# Patient Record
Sex: Male | Born: 1940
Health system: Southern US, Community
[De-identification: ages and names within clinical notes are randomized; demographics above are authoritative.]

## PROBLEM LIST (undated history)

## (undated) DIAGNOSIS — L570 Actinic keratosis: Secondary | ICD-10-CM

## (undated) DIAGNOSIS — H9201 Otalgia, right ear: Secondary | ICD-10-CM

## (undated) DIAGNOSIS — N400 Enlarged prostate without lower urinary tract symptoms: Secondary | ICD-10-CM

## (undated) DIAGNOSIS — E119 Type 2 diabetes mellitus without complications: Secondary | ICD-10-CM

## (undated) DIAGNOSIS — E785 Hyperlipidemia, unspecified: Secondary | ICD-10-CM

## (undated) DIAGNOSIS — M546 Pain in thoracic spine: Secondary | ICD-10-CM

## (undated) DIAGNOSIS — M6281 Muscle weakness (generalized): Secondary | ICD-10-CM

## (undated) DIAGNOSIS — I341 Nonrheumatic mitral (valve) prolapse: Secondary | ICD-10-CM

## (undated) DIAGNOSIS — Z9989 Dependence on other enabling machines and devices: Secondary | ICD-10-CM

## (undated) DIAGNOSIS — F329 Major depressive disorder, single episode, unspecified: Secondary | ICD-10-CM

## (undated) DIAGNOSIS — M19049 Primary osteoarthritis, unspecified hand: Secondary | ICD-10-CM

## (undated) DIAGNOSIS — E291 Testicular hypofunction: Secondary | ICD-10-CM

## (undated) DIAGNOSIS — N529 Male erectile dysfunction, unspecified: Secondary | ICD-10-CM

## (undated) DIAGNOSIS — F32A Depression, unspecified: Secondary | ICD-10-CM

## (undated) DIAGNOSIS — F4310 Post-traumatic stress disorder, unspecified: Secondary | ICD-10-CM

## (undated) DIAGNOSIS — R1310 Dysphagia, unspecified: Secondary | ICD-10-CM

## (undated) DIAGNOSIS — M169 Osteoarthritis of hip, unspecified: Secondary | ICD-10-CM

## (undated) DIAGNOSIS — M545 Low back pain, unspecified: Secondary | ICD-10-CM

## (undated) DIAGNOSIS — M25519 Pain in unspecified shoulder: Secondary | ICD-10-CM

## (undated) DIAGNOSIS — G479 Sleep disorder, unspecified: Secondary | ICD-10-CM

## (undated) DIAGNOSIS — R569 Unspecified convulsions: Secondary | ICD-10-CM

## (undated) DIAGNOSIS — M1990 Unspecified osteoarthritis, unspecified site: Secondary | ICD-10-CM

## (undated) DIAGNOSIS — H5310 Unspecified subjective visual disturbances: Secondary | ICD-10-CM

## (undated) DIAGNOSIS — I639 Cerebral infarction, unspecified: Secondary | ICD-10-CM

## (undated) DIAGNOSIS — IMO0002 Reserved for concepts with insufficient information to code with codable children: Secondary | ICD-10-CM

## (undated) DIAGNOSIS — R002 Palpitations: Secondary | ICD-10-CM

## (undated) DIAGNOSIS — M542 Cervicalgia: Secondary | ICD-10-CM

## (undated) DIAGNOSIS — I499 Cardiac arrhythmia, unspecified: Secondary | ICD-10-CM

## (undated) DIAGNOSIS — R609 Edema, unspecified: Secondary | ICD-10-CM

## (undated) DIAGNOSIS — M2550 Pain in unspecified joint: Secondary | ICD-10-CM

## (undated) DIAGNOSIS — R001 Bradycardia, unspecified: Secondary | ICD-10-CM

## (undated) DIAGNOSIS — I10 Essential (primary) hypertension: Secondary | ICD-10-CM

## (undated) DIAGNOSIS — M25562 Pain in left knee: Secondary | ICD-10-CM

## (undated) DIAGNOSIS — G4733 Obstructive sleep apnea (adult) (pediatric): Secondary | ICD-10-CM

## (undated) DIAGNOSIS — I4891 Unspecified atrial fibrillation: Secondary | ICD-10-CM

## (undated) HISTORY — DX: Unspecified convulsions: R56.9

## (undated) HISTORY — PX: KNEE SURGERY: SHX244

## (undated) HISTORY — DX: Male erectile dysfunction, unspecified: N52.9

## (undated) HISTORY — DX: Obstructive sleep apnea (adult) (pediatric): G47.33

## (undated) HISTORY — DX: Hyperlipidemia, unspecified: E78.5

## (undated) HISTORY — PX: CATARACT EXTRACTION: SUR2

## (undated) HISTORY — PX: COLONOSCOPY: SHX174

## (undated) HISTORY — DX: Depression, unspecified: F32.A

## (undated) HISTORY — DX: Osteoarthritis of hip, unspecified: M16.9

## (undated) HISTORY — DX: Low back pain, unspecified: M54.50

## (undated) HISTORY — DX: Essential (primary) hypertension: I10

## (undated) HISTORY — DX: Primary osteoarthritis, unspecified hand: M19.049

## (undated) HISTORY — DX: Post-traumatic stress disorder, unspecified: F43.10

## (undated) HISTORY — DX: Unspecified atrial fibrillation: I48.91

## (undated) HISTORY — PX: APPENDECTOMY: SHX54

## (undated) HISTORY — DX: Pain in unspecified shoulder: M25.519

## (undated) HISTORY — DX: Pain in unspecified joint: M25.50

## (undated) HISTORY — PX: CHOLECYSTECTOMY: SHX55

## (undated) HISTORY — DX: Testicular hypofunction: E29.1

## (undated) HISTORY — DX: Muscle weakness (generalized): M62.81

## (undated) HISTORY — DX: Low back pain: M54.5

## (undated) HISTORY — DX: Pain in left knee: M25.562

## (undated) HISTORY — PX: CARDIAC CATHETERIZATION: SHX172

## (undated) HISTORY — DX: Otalgia, right ear: H92.01

## (undated) HISTORY — DX: Actinic keratosis: L57.0

## (undated) HISTORY — DX: Nonrheumatic mitral (valve) prolapse: I34.1

## (undated) HISTORY — DX: Cervicalgia: M54.2

## (undated) HISTORY — DX: Type 2 diabetes mellitus without complications: E11.9

## (undated) HISTORY — DX: Dependence on other enabling machines and devices: Z99.89

## (undated) HISTORY — DX: Pain in thoracic spine: M54.6

## (undated) HISTORY — DX: Edema, unspecified: R60.9

## (undated) HISTORY — DX: Major depressive disorder, single episode, unspecified: F32.9

## (undated) HISTORY — DX: Unspecified subjective visual disturbances: H53.10

## (undated) HISTORY — DX: Benign prostatic hyperplasia without lower urinary tract symptoms: N40.0

## (undated) HISTORY — DX: Palpitations: R00.2

## (undated) HISTORY — DX: Sleep disorder, unspecified: G47.9

## (undated) HISTORY — DX: Unspecified osteoarthritis, unspecified site: M19.90

## (undated) HISTORY — PX: JOINT REPLACEMENT: SHX530

## (undated) HISTORY — DX: Reserved for concepts with insufficient information to code with codable children: IMO0002

---

## 1985-03-30 HISTORY — PX: BACK SURGERY: SHX140

## 1999-09-05 ENCOUNTER — Encounter: Admission: RE | Admit: 1999-09-05 | Discharge: 1999-09-05 | Payer: Self-pay | Admitting: Orthopedic Surgery

## 1999-09-05 ENCOUNTER — Encounter: Payer: Self-pay | Admitting: Orthopedic Surgery

## 2000-07-18 ENCOUNTER — Encounter: Payer: Self-pay | Admitting: Pulmonary Disease

## 2000-07-18 ENCOUNTER — Ambulatory Visit (HOSPITAL_BASED_OUTPATIENT_CLINIC_OR_DEPARTMENT_OTHER): Admission: RE | Admit: 2000-07-18 | Discharge: 2000-07-18 | Payer: Self-pay | Admitting: Critical Care Medicine

## 2000-09-07 ENCOUNTER — Ambulatory Visit (HOSPITAL_BASED_OUTPATIENT_CLINIC_OR_DEPARTMENT_OTHER): Admission: RE | Admit: 2000-09-07 | Discharge: 2000-09-07 | Payer: Self-pay | Admitting: Critical Care Medicine

## 2000-09-07 ENCOUNTER — Encounter: Payer: Self-pay | Admitting: Pulmonary Disease

## 2002-03-30 HISTORY — PX: CERVICAL LAMINECTOMY: SHX94

## 2002-06-11 ENCOUNTER — Ambulatory Visit (HOSPITAL_COMMUNITY): Admission: RE | Admit: 2002-06-11 | Discharge: 2002-06-11 | Payer: Self-pay | Admitting: Rheumatology

## 2002-06-11 ENCOUNTER — Encounter: Payer: Self-pay | Admitting: Neurosurgery

## 2002-06-26 ENCOUNTER — Encounter: Payer: Self-pay | Admitting: Neurosurgery

## 2002-06-28 ENCOUNTER — Inpatient Hospital Stay (HOSPITAL_COMMUNITY): Admission: RE | Admit: 2002-06-28 | Discharge: 2002-07-01 | Payer: Self-pay | Admitting: Neurosurgery

## 2002-06-28 ENCOUNTER — Encounter: Payer: Self-pay | Admitting: Neurosurgery

## 2002-08-07 ENCOUNTER — Encounter: Admission: RE | Admit: 2002-08-07 | Discharge: 2002-11-05 | Payer: Self-pay | Admitting: Neurosurgery

## 2004-01-23 ENCOUNTER — Inpatient Hospital Stay (HOSPITAL_COMMUNITY): Admission: EM | Admit: 2004-01-23 | Discharge: 2004-01-24 | Payer: Self-pay | Admitting: Emergency Medicine

## 2004-01-24 ENCOUNTER — Encounter: Payer: Self-pay | Admitting: Cardiology

## 2004-02-06 ENCOUNTER — Ambulatory Visit: Payer: Self-pay | Admitting: Cardiovascular Disease

## 2004-02-06 ENCOUNTER — Ambulatory Visit: Payer: Self-pay | Admitting: Internal Medicine

## 2004-04-16 ENCOUNTER — Ambulatory Visit: Payer: Self-pay | Admitting: Internal Medicine

## 2004-06-16 ENCOUNTER — Ambulatory Visit: Payer: Self-pay | Admitting: Internal Medicine

## 2004-09-16 ENCOUNTER — Ambulatory Visit: Payer: Self-pay | Admitting: Internal Medicine

## 2004-11-12 ENCOUNTER — Ambulatory Visit: Payer: Self-pay | Admitting: Internal Medicine

## 2005-01-14 ENCOUNTER — Ambulatory Visit: Payer: Self-pay | Admitting: Internal Medicine

## 2005-02-17 ENCOUNTER — Ambulatory Visit: Payer: Self-pay | Admitting: Internal Medicine

## 2005-02-27 ENCOUNTER — Ambulatory Visit: Payer: Self-pay | Admitting: Cardiovascular Disease

## 2005-03-03 ENCOUNTER — Ambulatory Visit: Payer: Self-pay | Admitting: Internal Medicine

## 2005-05-05 ENCOUNTER — Ambulatory Visit: Payer: Self-pay | Admitting: Internal Medicine

## 2005-05-19 ENCOUNTER — Ambulatory Visit: Payer: Self-pay | Admitting: Internal Medicine

## 2005-07-23 ENCOUNTER — Ambulatory Visit: Payer: Self-pay | Admitting: Internal Medicine

## 2005-09-23 ENCOUNTER — Ambulatory Visit: Payer: Self-pay | Admitting: Internal Medicine

## 2005-12-24 ENCOUNTER — Ambulatory Visit: Payer: Self-pay | Admitting: Internal Medicine

## 2006-02-25 ENCOUNTER — Ambulatory Visit: Payer: Self-pay | Admitting: Internal Medicine

## 2006-05-10 ENCOUNTER — Ambulatory Visit: Payer: Self-pay | Admitting: Internal Medicine

## 2006-05-10 LAB — CONVERTED CEMR LAB
AST: 24 units/L (ref 0–37)
Bilirubin, Direct: 0.1 mg/dL (ref 0.0–0.3)
Cholesterol: 121 mg/dL (ref 0–200)
GFR calc Af Amer: 125 mL/min
GFR calc non Af Amer: 103 mL/min
Glucose, Bld: 90 mg/dL (ref 70–99)
HDL: 23 mg/dL — ABNORMAL LOW (ref >39.0)
LDL Cholesterol: 72 mg/dL (ref 0–99)
Sodium: 139 meq/L (ref 135–145)
Total CHOL/HDL Ratio: 5.3
Total Protein: 6.6 g/dL (ref 6.0–8.3)

## 2006-05-12 ENCOUNTER — Ambulatory Visit: Payer: Self-pay | Admitting: Internal Medicine

## 2006-08-12 ENCOUNTER — Ambulatory Visit: Payer: Self-pay | Admitting: Internal Medicine

## 2006-08-12 LAB — CONVERTED CEMR LAB: Vit D, 1,25-Dihydroxy: 24 (ref 20–57)

## 2006-10-13 ENCOUNTER — Ambulatory Visit: Payer: Self-pay | Admitting: Internal Medicine

## 2006-12-15 ENCOUNTER — Ambulatory Visit: Payer: Self-pay | Admitting: Internal Medicine

## 2007-01-11 ENCOUNTER — Encounter: Payer: Self-pay | Admitting: Internal Medicine

## 2007-01-11 DIAGNOSIS — I10 Essential (primary) hypertension: Secondary | ICD-10-CM | POA: Insufficient documentation

## 2007-01-11 DIAGNOSIS — M545 Low back pain, unspecified: Secondary | ICD-10-CM | POA: Insufficient documentation

## 2007-01-11 DIAGNOSIS — E119 Type 2 diabetes mellitus without complications: Secondary | ICD-10-CM

## 2007-02-09 ENCOUNTER — Ambulatory Visit: Payer: Self-pay | Admitting: Internal Medicine

## 2007-02-09 LAB — CONVERTED CEMR LAB
Albumin: 3.7 g/dL (ref 3.5–5.2)
Alkaline Phosphatase: 65 units/L (ref 39–117)
BUN: 9 mg/dL (ref 6–23)
Basophils Absolute: 0 10*3/uL (ref 0.0–0.1)
Bilirubin Urine: NEGATIVE
Calcium: 9.1 mg/dL (ref 8.4–10.5)
GFR calc Af Amer: 109 mL/min
GFR calc non Af Amer: 90 mL/min
HDL: 18.7 mg/dL — ABNORMAL LOW (ref >39.0)
Hemoglobin, Urine: NEGATIVE
Hemoglobin: 14.8 g/dL (ref 13.0–17.0)
Leukocytes, UA: NEGATIVE
Lymphocytes Relative: 32.5 % (ref 12.0–46.0)
MCHC: 35.7 g/dL (ref 30.0–36.0)
Monocytes Absolute: 0.5 10*3/uL (ref 0.2–0.7)
Monocytes Relative: 7.2 % (ref 3.0–11.0)
Neutro Abs: 4.3 10*3/uL (ref 1.4–7.7)
Platelets: 252 10*3/uL (ref 150–400)
Potassium: 4.5 meq/L (ref 3.5–5.1)
Total CHOL/HDL Ratio: 7.8
Total Protein, Urine: NEGATIVE mg/dL
Triglycerides: 134 mg/dL (ref 0–149)
Urine Glucose: NEGATIVE mg/dL
VLDL: 27 mg/dL (ref 0–40)
pH: 6 (ref 5.0–8.0)

## 2007-02-14 ENCOUNTER — Ambulatory Visit: Payer: Self-pay | Admitting: Internal Medicine

## 2007-02-14 DIAGNOSIS — E785 Hyperlipidemia, unspecified: Secondary | ICD-10-CM

## 2007-02-14 DIAGNOSIS — F329 Major depressive disorder, single episode, unspecified: Secondary | ICD-10-CM

## 2007-02-14 DIAGNOSIS — L219 Seborrheic dermatitis, unspecified: Secondary | ICD-10-CM | POA: Insufficient documentation

## 2007-02-14 DIAGNOSIS — I4891 Unspecified atrial fibrillation: Secondary | ICD-10-CM | POA: Insufficient documentation

## 2007-02-15 DIAGNOSIS — M1990 Unspecified osteoarthritis, unspecified site: Secondary | ICD-10-CM | POA: Insufficient documentation

## 2007-03-16 ENCOUNTER — Telehealth: Payer: Self-pay | Admitting: Internal Medicine

## 2007-04-19 ENCOUNTER — Telehealth: Payer: Self-pay | Admitting: Internal Medicine

## 2007-04-29 ENCOUNTER — Ambulatory Visit: Payer: Self-pay | Admitting: Internal Medicine

## 2007-04-29 DIAGNOSIS — M25559 Pain in unspecified hip: Secondary | ICD-10-CM | POA: Insufficient documentation

## 2007-05-23 ENCOUNTER — Telehealth: Payer: Self-pay | Admitting: Internal Medicine

## 2007-05-26 ENCOUNTER — Encounter: Payer: Self-pay | Admitting: Internal Medicine

## 2007-06-24 ENCOUNTER — Ambulatory Visit: Payer: Self-pay | Admitting: Internal Medicine

## 2007-06-29 LAB — CONVERTED CEMR LAB
AST: 21 units/L (ref 0–37)
Albumin: 3.7 g/dL (ref 3.5–5.2)
BUN: 8 mg/dL (ref 6–23)
Calcium: 8.6 mg/dL (ref 8.4–10.5)
Chloride: 106 meq/L (ref 96–112)
Cholesterol: 140 mg/dL (ref 0–200)
Creatinine, Ser: 0.8 mg/dL (ref 0.4–1.5)
GFR calc Af Amer: 124 mL/min
GFR calc non Af Amer: 103 mL/min
HDL: 21 mg/dL — ABNORMAL LOW (ref >39.0)
LDL Cholesterol: 83 mg/dL (ref 0–99)
Triglycerides: 180 mg/dL — ABNORMAL HIGH (ref 0–149)
VLDL: 36 mg/dL (ref 0–40)

## 2007-08-19 ENCOUNTER — Encounter: Payer: Self-pay | Admitting: Internal Medicine

## 2007-08-25 ENCOUNTER — Ambulatory Visit: Payer: Self-pay | Admitting: Internal Medicine

## 2007-08-25 DIAGNOSIS — H5310 Unspecified subjective visual disturbances: Secondary | ICD-10-CM | POA: Insufficient documentation

## 2007-08-25 DIAGNOSIS — G478 Other sleep disorders: Secondary | ICD-10-CM

## 2007-08-25 DIAGNOSIS — R002 Palpitations: Secondary | ICD-10-CM | POA: Insufficient documentation

## 2007-08-25 DIAGNOSIS — R569 Unspecified convulsions: Secondary | ICD-10-CM | POA: Insufficient documentation

## 2007-08-26 ENCOUNTER — Encounter: Payer: Self-pay | Admitting: Internal Medicine

## 2007-08-26 DIAGNOSIS — E538 Deficiency of other specified B group vitamins: Secondary | ICD-10-CM

## 2007-08-29 LAB — CONVERTED CEMR LAB
AST: 22 units/L (ref 0–37)
Albumin: 4 g/dL (ref 3.5–5.2)
Alkaline Phosphatase: 70 units/L (ref 39–117)
BUN: 12 mg/dL (ref 6–23)
Chloride: 106 meq/L (ref 96–112)
Eosinophils Relative: 0.5 % (ref 0.0–5.0)
Glucose, Bld: 122 mg/dL — ABNORMAL HIGH (ref 70–99)
HCT: 43.5 % (ref 39.0–52.0)
Monocytes Absolute: 0.6 10*3/uL (ref 0.1–1.0)
Monocytes Relative: 8.2 % (ref 3.0–12.0)
Platelets: 254 10*3/uL (ref 150–400)
Potassium: 3.9 meq/L (ref 3.5–5.1)
Total Protein: 7.3 g/dL (ref 6.0–8.3)
WBC: 7.9 10*3/uL (ref 4.5–10.5)

## 2007-09-02 ENCOUNTER — Encounter (INDEPENDENT_AMBULATORY_CARE_PROVIDER_SITE_OTHER): Payer: Self-pay | Admitting: *Deleted

## 2007-11-21 ENCOUNTER — Encounter: Payer: Self-pay | Admitting: Internal Medicine

## 2007-12-06 ENCOUNTER — Ambulatory Visit: Payer: Self-pay | Admitting: Internal Medicine

## 2008-01-02 ENCOUNTER — Telehealth: Payer: Self-pay | Admitting: Internal Medicine

## 2008-01-03 ENCOUNTER — Telehealth: Payer: Self-pay | Admitting: Internal Medicine

## 2008-02-03 ENCOUNTER — Telehealth: Payer: Self-pay | Admitting: Internal Medicine

## 2008-03-08 ENCOUNTER — Ambulatory Visit: Payer: Self-pay | Admitting: Internal Medicine

## 2008-03-08 ENCOUNTER — Telehealth: Payer: Self-pay | Admitting: Internal Medicine

## 2008-03-08 LAB — CONVERTED CEMR LAB
ALT: 27 units/L (ref 0–53)
CO2: 29 meq/L (ref 19–32)
Calcium: 9 mg/dL (ref 8.4–10.5)
Creatinine, Ser: 0.9 mg/dL (ref 0.4–1.5)
Glucose, Bld: 107 mg/dL — ABNORMAL HIGH (ref 70–99)
TSH: 0.89 microintl units/mL (ref 0.35–5.50)
Total Protein: 6.7 g/dL (ref 6.0–8.3)

## 2008-03-14 ENCOUNTER — Ambulatory Visit: Payer: Self-pay | Admitting: Internal Medicine

## 2008-04-10 ENCOUNTER — Telehealth: Payer: Self-pay | Admitting: Internal Medicine

## 2008-04-17 ENCOUNTER — Ambulatory Visit: Payer: Self-pay | Admitting: Internal Medicine

## 2008-04-17 LAB — CONVERTED CEMR LAB
Calcium: 9 mg/dL (ref 8.4–10.5)
GFR calc Af Amer: 124 mL/min
Glucose, Bld: 101 mg/dL — ABNORMAL HIGH (ref 70–99)
Sodium: 137 meq/L (ref 135–145)
Testosterone: 279.88 ng/dL — ABNORMAL LOW (ref 350.00–890)

## 2008-05-02 ENCOUNTER — Ambulatory Visit: Payer: Self-pay | Admitting: Internal Medicine

## 2008-05-02 DIAGNOSIS — E291 Testicular hypofunction: Secondary | ICD-10-CM | POA: Insufficient documentation

## 2008-05-02 LAB — CONVERTED CEMR LAB
Alkaline Phosphatase: 70 units/L (ref 39–117)
Bilirubin, Direct: 0.1 mg/dL (ref 0.0–0.3)
Eosinophils Absolute: 0.1 10*3/uL (ref 0.0–0.7)
Eosinophils Relative: 1 % (ref 0.0–5.0)
MCV: 89.1 fL (ref 78.0–100.0)
Neutrophils Relative %: 63.7 % (ref 43.0–77.0)
Platelets: 239 10*3/uL (ref 150–400)
RDW: 13.1 % (ref 11.5–14.6)
Total Bilirubin: 0.5 mg/dL (ref 0.3–1.2)
Total Protein: 7.4 g/dL (ref 6.0–8.3)
WBC: 8.8 10*3/uL (ref 4.5–10.5)

## 2008-06-07 ENCOUNTER — Telehealth: Payer: Self-pay | Admitting: Internal Medicine

## 2008-07-02 ENCOUNTER — Ambulatory Visit: Payer: Self-pay | Admitting: Internal Medicine

## 2008-07-04 LAB — CONVERTED CEMR LAB
ALT: 21 units/L (ref 0–53)
AST: 22 units/L (ref 0–37)
Alkaline Phosphatase: 60 units/L (ref 39–117)
Bilirubin, Direct: 0.1 mg/dL (ref 0.0–0.3)
Chloride: 104 meq/L (ref 96–112)
GFR calc non Af Amer: 79.08 mL/min (ref >60)
Hgb A1c MFr Bld: 5.2 % (ref 4.6–6.5)
Potassium: 3.9 meq/L (ref 3.5–5.1)
Sodium: 140 meq/L (ref 135–145)
Total Bilirubin: 0.8 mg/dL (ref 0.3–1.2)
Vitamin B-12: 347 pg/mL (ref 211–911)

## 2008-07-11 ENCOUNTER — Encounter: Payer: Self-pay | Admitting: Internal Medicine

## 2008-07-30 ENCOUNTER — Telehealth: Payer: Self-pay | Admitting: Internal Medicine

## 2008-09-05 ENCOUNTER — Telehealth: Payer: Self-pay | Admitting: Internal Medicine

## 2008-10-02 ENCOUNTER — Ambulatory Visit: Payer: Self-pay | Admitting: Internal Medicine

## 2008-10-24 ENCOUNTER — Telehealth: Payer: Self-pay | Admitting: Internal Medicine

## 2008-10-30 ENCOUNTER — Telehealth: Payer: Self-pay | Admitting: Internal Medicine

## 2008-11-01 ENCOUNTER — Ambulatory Visit: Payer: Self-pay | Admitting: Internal Medicine

## 2008-11-01 LAB — CONVERTED CEMR LAB
ALT: 24 units/L (ref 0–53)
Albumin: 4 g/dL (ref 3.5–5.2)
Basophils Relative: 1.6 % (ref 0.0–3.0)
CO2: 27 meq/L (ref 19–32)
Calcium: 9.4 mg/dL (ref 8.4–10.5)
Chloride: 103 meq/L (ref 96–112)
Creatinine, Ser: 1.1 mg/dL (ref 0.4–1.5)
Eosinophils Absolute: 0.1 10*3/uL (ref 0.0–0.7)
Eosinophils Relative: 1.6 % (ref 0.0–5.0)
Glucose, Bld: 90 mg/dL (ref 70–99)
Lymphocytes Relative: 23.6 % (ref 12.0–46.0)
MCHC: 35.3 g/dL (ref 30.0–36.0)
MCV: 89.3 fL (ref 78.0–100.0)
Monocytes Absolute: 0.6 10*3/uL (ref 0.1–1.0)
Neutrophils Relative %: 65.2 % (ref 43.0–77.0)
Platelets: 243 10*3/uL (ref 150.0–400.0)
RBC: 5.59 M/uL (ref 4.22–5.81)
Total Protein: 7.1 g/dL (ref 6.0–8.3)
Vitamin B-12: 363 pg/mL (ref 211–911)
WBC: 7.7 10*3/uL (ref 4.5–10.5)

## 2008-11-05 LAB — CONVERTED CEMR LAB: Vit D, 25-Hydroxy: 21 ng/mL — ABNORMAL LOW (ref 30–89)

## 2008-11-12 ENCOUNTER — Ambulatory Visit: Payer: Self-pay | Admitting: Internal Medicine

## 2008-11-15 ENCOUNTER — Telehealth: Payer: Self-pay | Admitting: Internal Medicine

## 2008-11-15 DIAGNOSIS — Z9989 Dependence on other enabling machines and devices: Secondary | ICD-10-CM

## 2008-11-15 DIAGNOSIS — G4733 Obstructive sleep apnea (adult) (pediatric): Secondary | ICD-10-CM | POA: Insufficient documentation

## 2008-11-23 ENCOUNTER — Ambulatory Visit: Payer: Self-pay | Admitting: Pulmonary Disease

## 2008-11-27 ENCOUNTER — Encounter: Payer: Self-pay | Admitting: Internal Medicine

## 2008-12-21 ENCOUNTER — Ambulatory Visit: Payer: Self-pay | Admitting: Pulmonary Disease

## 2008-12-31 ENCOUNTER — Telehealth: Payer: Self-pay | Admitting: Internal Medicine

## 2009-01-29 ENCOUNTER — Telehealth: Payer: Self-pay | Admitting: Internal Medicine

## 2009-02-19 ENCOUNTER — Ambulatory Visit: Payer: Self-pay | Admitting: Internal Medicine

## 2009-02-19 DIAGNOSIS — Z720 Tobacco use: Secondary | ICD-10-CM | POA: Insufficient documentation

## 2009-02-19 DIAGNOSIS — Z87891 Personal history of nicotine dependence: Secondary | ICD-10-CM

## 2009-04-22 ENCOUNTER — Ambulatory Visit: Payer: Self-pay | Admitting: Internal Medicine

## 2009-04-22 LAB — CONVERTED CEMR LAB
Alkaline Phosphatase: 72 units/L (ref 39–117)
Bilirubin, Direct: 0.1 mg/dL (ref 0.0–0.3)
CO2: 31 meq/L (ref 19–32)
Calcium: 9.4 mg/dL (ref 8.4–10.5)
Creatinine, Ser: 0.9 mg/dL (ref 0.4–1.5)
Sodium: 140 meq/L (ref 135–145)
Total Bilirubin: 0.7 mg/dL (ref 0.3–1.2)

## 2009-04-26 ENCOUNTER — Ambulatory Visit: Payer: Self-pay | Admitting: Internal Medicine

## 2009-04-26 DIAGNOSIS — M546 Pain in thoracic spine: Secondary | ICD-10-CM | POA: Insufficient documentation

## 2009-04-29 ENCOUNTER — Telehealth: Payer: Self-pay | Admitting: Internal Medicine

## 2009-05-15 ENCOUNTER — Encounter: Payer: Self-pay | Admitting: Internal Medicine

## 2009-05-20 ENCOUNTER — Telehealth: Payer: Self-pay | Admitting: Internal Medicine

## 2009-06-26 ENCOUNTER — Ambulatory Visit: Payer: Self-pay | Admitting: Internal Medicine

## 2009-08-06 ENCOUNTER — Encounter: Payer: Self-pay | Admitting: Internal Medicine

## 2009-08-27 ENCOUNTER — Ambulatory Visit: Payer: Self-pay | Admitting: Internal Medicine

## 2009-10-04 ENCOUNTER — Encounter: Payer: Self-pay | Admitting: Internal Medicine

## 2009-11-04 ENCOUNTER — Telehealth: Payer: Self-pay | Admitting: Internal Medicine

## 2009-12-09 ENCOUNTER — Ambulatory Visit: Payer: Self-pay | Admitting: Internal Medicine

## 2009-12-09 LAB — CONVERTED CEMR LAB
ALT: 21 units/L (ref 0–53)
BUN: 11 mg/dL (ref 6–23)
Basophils Relative: 0.7 % (ref 0.0–3.0)
CO2: 30 meq/L (ref 19–32)
Chloride: 103 meq/L (ref 96–112)
Cholesterol: 128 mg/dL (ref 0–200)
Creatinine, Ser: 1 mg/dL (ref 0.4–1.5)
Eosinophils Absolute: 0.1 10*3/uL (ref 0.0–0.7)
Eosinophils Relative: 2.2 % (ref 0.0–5.0)
HCT: 40.8 % (ref 39.0–52.0)
Hemoglobin, Urine: NEGATIVE
Hgb A1c MFr Bld: 5.5 % (ref 4.6–6.5)
LDL Cholesterol: 81 mg/dL (ref 0–99)
Leukocytes, UA: NEGATIVE
Lymphs Abs: 1.6 10*3/uL (ref 0.7–4.0)
MCHC: 34 g/dL (ref 30.0–36.0)
MCV: 90.4 fL (ref 78.0–100.0)
Monocytes Absolute: 0.4 10*3/uL (ref 0.1–1.0)
Nitrite: NEGATIVE
PSA: 0.57 ng/mL (ref 0.10–4.00)
Platelets: 234 10*3/uL (ref 150.0–400.0)
Potassium: 5.1 meq/L (ref 3.5–5.1)
Specific Gravity, Urine: 1.02 (ref 1.000–1.030)
TSH: 1.27 microintl units/mL (ref 0.35–5.50)
Total Bilirubin: 0.7 mg/dL (ref 0.3–1.2)
Total Protein: 6.6 g/dL (ref 6.0–8.3)
Triglycerides: 134 mg/dL (ref 0.0–149.0)
Urobilinogen, UA: 0.2 (ref 0.0–1.0)
Vitamin B-12: 472 pg/mL (ref 211–911)
WBC: 6.1 10*3/uL (ref 4.5–10.5)

## 2009-12-11 ENCOUNTER — Ambulatory Visit: Payer: Self-pay | Admitting: Internal Medicine

## 2009-12-11 ENCOUNTER — Encounter: Payer: Self-pay | Admitting: Internal Medicine

## 2009-12-11 DIAGNOSIS — J019 Acute sinusitis, unspecified: Secondary | ICD-10-CM

## 2009-12-30 ENCOUNTER — Encounter: Payer: Self-pay | Admitting: Internal Medicine

## 2010-03-17 ENCOUNTER — Ambulatory Visit: Payer: Self-pay | Admitting: Internal Medicine

## 2010-03-17 DIAGNOSIS — I498 Other specified cardiac arrhythmias: Secondary | ICD-10-CM

## 2010-03-17 DIAGNOSIS — R001 Bradycardia, unspecified: Secondary | ICD-10-CM | POA: Insufficient documentation

## 2010-04-29 NOTE — Progress Notes (Signed)
Summary: Lotrel  Phone Note Other Incoming   Summary of Call: We rec'd notice that patients plan does not cover generic Lotrel. I called insurance company and verified that the preferred alternative is brand name Lotrel. Ok to change and update? Initial call taken by: Lucious Groves,  May 20, 2009 11:53 AM  Follow-up for Phone Call        either way -can take generic Amlod and Benazepr Follow-up by: Tresa Garter MD,  May 20, 2009 1:27 PM  Additional Follow-up for Phone Call Additional follow up Details #1::        done Additional Follow-up by: Lucious Groves,  May 20, 2009 2:04 PM    New/Updated Medications: LOTREL 10-40 MG CAPS (AMLODIPINE BESY-BENAZEPRIL HCL) 1 by mouth qd [BMN] Prescriptions: LOTREL 10-40 MG CAPS (AMLODIPINE BESY-BENAZEPRIL HCL) 1 by mouth qd Brand medically necessary #90 x 3   Entered by:   Lucious Groves   Authorized by:   Tresa Garter MD   Signed by:   Lucious Groves on 05/20/2009   Method used:   Electronically to        [facility] [phone]* (retail)       [address]       [place], [place]  [address]       Ph: [phone]       Fax: [phone]   RxID:   [id number]

## 2010-04-29 NOTE — Assessment & Plan Note (Signed)
Summary: 2 MO ROV /NWS  #   Vital Signs:  Patient profile:   70 year old male Weight:      238 pounds Temp:     98.1 degrees F oral Pulse rate:   45 / minute BP sitting:   158 / 60  (left arm)  Vitals Entered By: Tora Perches (April 26, 2009 2:40 PM) CC: f/u Is Patient Diabetic? No   Primary Care Provider:  Tresa Garter MD  CC:  f/u.  History of Present Illness: The patient presents for a follow up of hypertension, LBP and thor spine -  5/10 - worse, hyperlipidemia. Ran out of LOtrel 2 wks ago   Preventive Screening-Counseling & Management  Alcohol-Tobacco     Smoking Status: quit  Current Medications (verified): 1)  Lotrel 5-20 Mg Caps (Amlodipine Besy-Benazepril Hcl) .Marland Kitchen.. 1 By Mouth Bid 2)  Propranolol Hcl 80 Mg  Tabs (Propranolol Hcl) .Marland Kitchen.. 1 By Mouth Tid 3)  Lotrisone 1-0.05 % Crea (Clotrimazole-Betamethasone) .... Two Times A Day X 1 Mo Prn 4)  Prozac 40 Mg  Caps (Fluoxetine Hcl) .... Once Daily 5)  Lanoxin 0.25 Mg  Tabs (Digoxin) .Marland Kitchen.. 11/2 By Mouth Once Daily 6)  Clonazepam 1 Mg  Tabs (Clonazepam) .Marland Kitchen.. 1 By Mouth Two Times A Day Prn 7)  Flexeril 10 Mg  Tabs (Cyclobenzaprine Hcl) .... Take 1 By Mouth Three Times A Day Qd 8)  Demadex 100 Mg Tabs (Torsemide) .Marland Kitchen.. 1 By Mouth Once Daily Prn 9)  Vitamin D3 1000 Unit  Tabs (Cholecalciferol) .Marland Kitchen.. 1 Qd 10)  Aspir-Low 81 Mg Tbec (Aspirin) .Marland Kitchen.. 1 Once Daily Pc 11)  Vitamin B-12 Cr 1000 Mcg  Tbcr (Cyanocobalamin) .... Take One Tablet By Mouth Daily 12)  Wellbutrin Sr 150 Mg Xr12h-Tab (Bupropion Hcl) .Marland Kitchen.. 1 By Mouth Bid 13)  Testosterone Cypionate 200 Mg/ml Oil (Testosterone Cypionate) .... 2 Ml Im Q 2 Wks 14)  Bd Integra Syringe 25g X 1" 3 Ml Misc (Syringe/needle (Disp)) .... As Dirr 15)  Oxycodone Hcl 15 Mg  Tabs (Oxycodone Hcl) .Marland Kitchen.. 1 By Mouth Qid As Needed Pain Pls. Fill On or After 04/01/2009 16)  Vit B12 1200mg  .... Once Daily  Allergies: 1)  ! * Zomac 2)  Restoril (Temazepam) 3)  Norvasc (Amlodipine  Besylate)  Past History:  Past Medical History: Last updated: 03/14/2008 Diabetes mellitus, type II Hypertension Low back pain palpitations  785.1 ED OSA on cpap Hypogonadism Vit D def. 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 Former Smoker. started at age 67.  less than 1 ppd.  quit 1973 Alcohol use-no Drug use-no  Social History: Smoking Status:  quit  Physical Exam  General:  NAD overweight-appearing.   Nose:  External nasal examination shows no deformity or inflammation. Nasal mucosa are pink and moist without lesions or exudates. Mouth:  Oral mucosa and oropharynx without lesions or exudates.  Teeth in good repair. Neck:  No deformities, masses, or tenderness noted. Lungs:  CTA Heart:  RRR Abdomen:  S/NT Msk:  Cervical spine is tender to palpation over paraspinal muscles and with the ROM Lumbar-sacral spine is tender to palpation over paraspinal muscles and painfull with the ROM  Extremities:  No clubbing, cyanosis, edema, or deformity noted with normal full range of motion of all joints.   Neurologic:  No cranial nerve deficits noted. Station and gait are normal. Plantar reflexes are down-going  bilaterally. DTRs are symmetrical throughout. Sensory, motor and coordinative functions appear intact. Skin:  Intact without suspicious lesions or rashes Psych:  Oriented X3.    Impression & Recommendations:  Problem # 1:  LOW BACK PAIN (ICD-724.2) Assessment Deteriorated  His updated medication list for this problem includes:    Flexeril 10 Mg Tabs (Cyclobenzaprine hcl) .Marland Kitchen... Take 1 by mouth three times a day qd    Aspir-low 81 Mg Tbec (Aspirin) .Marland Kitchen... 1 once daily pc    Oxycodone Hcl 15 Mg Tabs (Oxycodone hcl) .Marland Kitchen... 1 by mouth qid as needed pain pls. fill on or after 05/30/2009  Orders: Orthopedic Referral  (Ortho)  Problem # 2:  B12 DEFICIENCY (ICD-266.2) Assessment: Improved  Problem # 3:  HYPERTENSION (ICD-401.9) Assessment: Unchanged  The following medications were removed from the medication list:    Lotrel 5-20 Mg Caps (Amlodipine besy-benazepril hcl) .Marland Kitchen... 1 by mouth bid His updated medication list for this problem includes:    Propranolol Hcl 80 Mg Tabs (Propranolol hcl) .Marland Kitchen... 1 by mouth tid    Demadex 100 Mg Tabs (Torsemide) .Marland Kitchen... 1 by mouth once daily prn    Lotrel 10-40 Mg Caps (Amlodipine besy-benazepril hcl) .Marland Kitchen... 1 by mouth qd The labs were reviewed with the patient.   Problem # 4:  HYPOGONADISM, MALE (ICD-257.2)  Problem # 5:  PAIN IN THORACIC SPINE (ICD-724.1) Assessment: Deteriorated  His updated medication list for this problem includes:    Flexeril 10 Mg Tabs (Cyclobenzaprine hcl) .Marland Kitchen... Take 1 by mouth three times a day qd    Aspir-low 81 Mg Tbec (Aspirin) .Marland Kitchen... 1 once daily pc    Oxycodone Hcl 15 Mg Tabs (Oxycodone hcl) .Marland Kitchen... 1 by mouth qid as needed pain pls. fill on or after 05/30/2009  Orders: T-Thoracic Spine 2 Views [phone]) Orthopedic Referral (Ortho)  Complete Medication List: 1)  Propranolol Hcl 80 Mg Tabs (Propranolol hcl) .Marland Kitchen.. 1 by mouth tid 2)  Lotrisone 1-0.05 % Crea (Clotrimazole-betamethasone) .... Two times a day x 1 mo prn 3)  Prozac 40 Mg Caps (Fluoxetine hcl) .... Once daily 4)  Lanoxin 0.25 Mg Tabs (Digoxin) .Marland Kitchen.. 11/2 by mouth once daily 5)  Clonazepam 1 Mg Tabs (Clonazepam) .Marland Kitchen.. 1 by mouth two times a day prn 6)  Flexeril 10 Mg Tabs (Cyclobenzaprine hcl) .... Take 1 by mouth three times a day qd 7)  Demadex 100 Mg Tabs (Torsemide) .Marland Kitchen.. 1 by mouth once daily prn 8)  Vitamin D3 1000 Unit Tabs (Cholecalciferol) .Marland Kitchen.. 1 qd 9)  Aspir-low 81 Mg Tbec (Aspirin) .Marland Kitchen.. 1 once daily pc 10)  Vitamin B-12 Cr 1000 Mcg Tbcr (Cyanocobalamin) .... Take one tablet by mouth daily 11)  Wellbutrin Sr 150 Mg Xr12h-tab (Bupropion hcl) .Marland Kitchen.. 1 by mouth bid 12)   Testosterone Cypionate 200 Mg/ml Oil (Testosterone cypionate) .... 2 ml im q 2 wks 13)  Bd Integra Syringe 25g X 1" 3 Ml Misc (Syringe/needle (disp)) .... As dirr 14)  Oxycodone Hcl 15 Mg Tabs (Oxycodone hcl) .Marland Kitchen.. 1 by mouth qid as needed pain pls. fill on or after 05/30/2009 15)  Vit B12 1200mg   .... Once daily 16)  Lotrel 10-40 Mg Caps (Amlodipine besy-benazepril hcl) .Marland Kitchen.. 1 by mouth qd  Patient Instructions: 1)  Please schedule a follow-up appointment in 2 months. Prescriptions: CLONAZEPAM 1 MG  TABS (CLONAZEPAM) 1 by mouth two times a day prn  #60 x 6   Entered and Authorized by:   Tresa Garter MD   Signed by:  Georgina Quint [name] MD on 04/26/2009   Method used:   Print then Give to Patient   RxID:   [id number] OXYCODONE HCL 15 MG  TABS (OXYCODONE HCL) 1 by mouth qid as needed pain pls. fill on or after 05/30/2009  #120 x 0   Entered and Authorized by:   Tresa Garter MD   Signed by:   Tresa Garter MD on 04/26/2009   Method used:   Print then Give to Patient   RxID:   [id number] OXYCODONE HCL 15 MG  TABS (OXYCODONE HCL) 1 by mouth qid as needed pain pls. fill on or after 05/02/2009  #120 x 0   Entered and Authorized by:   Tresa Garter MD   Signed by:   Tresa Garter MD on 04/26/2009   Method used:   Print then Give to Patient   RxID:   [id number] LOTREL 10-40 MG CAPS (AMLODIPINE BESY-BENAZEPRIL HCL) 1 by mouth qd  #90 x 3   Entered and Authorized by:   Tresa Garter MD   Signed by:   Tresa Garter MD on 04/26/2009   Method used:   Print then Give to Patient   RxID:   [id number]

## 2010-04-29 NOTE — Assessment & Plan Note (Signed)
Summary: 2 MTH FU---STC   Vital Signs:  Patient profile:   70 year old male Weight:      241 pounds Temp:     98.1 degrees F oral Pulse rate:   45 / minute BP sitting:   136 / 64  (left arm)  Vitals Entered By: Tora Perches (June 26, 2009 3:24 PM) CC: F/U Is Patient Diabetic? No   Primary Care Provider:  Tresa Garter MD  CC:  F/U.  History of Present Illness: The patient presents for a follow up of LBP, hypertension, diabetes, hyperlipidemia. He had an inj. in back by Dr Ethelene Hal yesturday C/o depression - worse  Preventive Screening-Counseling & Management  Alcohol-Tobacco     Smoking Status: quit  Current Medications (verified): 1)  Propranolol Hcl 80 Mg  Tabs (Propranolol Hcl) .Marland Kitchen.. 1 By Mouth Tid 2)  Lotrisone 1-0.05 % Crea (Clotrimazole-Betamethasone) .... Two Times A Day X 1 Mo Prn 3)  Prozac 40 Mg  Caps (Fluoxetine Hcl) .... Once Daily 4)  Lanoxin 0.25 Mg  Tabs (Digoxin) .Marland Kitchen.. 11/2 By Mouth Once Daily 5)  Clonazepam 1 Mg  Tabs (Clonazepam) .Marland Kitchen.. 1 By Mouth Two Times A Day Prn 6)  Flexeril 10 Mg  Tabs (Cyclobenzaprine Hcl) .... Take 1 By Mouth Three Times A Day Qd 7)  Demadex 100 Mg Tabs (Torsemide) .Marland Kitchen.. 1 By Mouth Once Daily Prn 8)  Vitamin D3 1000 Unit  Tabs (Cholecalciferol) .Marland Kitchen.. 1 Qd 9)  Aspir-Low 81 Mg Tbec (Aspirin) .Marland Kitchen.. 1 Once Daily Pc 10)  Vitamin B-12 Cr 1000 Mcg  Tbcr (Cyanocobalamin) .... Take One Tablet By Mouth Daily 11)  Wellbutrin Sr 150 Mg Xr12h-Tab (Bupropion Hcl) .Marland Kitchen.. 1 By Mouth Bid 12)  Testosterone Cypionate 200 Mg/ml Oil (Testosterone Cypionate) .... 2 Ml Im Q 2 Wks 13)  Bd Integra Syringe 25g X 1" 3 Ml Misc (Syringe/needle (Disp)) .... As Dirr 14)  Vit B12 1200mg  .... Once Daily 15)  Lotrel 10-40 Mg Caps (Amlodipine Besy-Benazepril Hcl) .Marland Kitchen.. 1 By Mouth Qd  Allergies: 1)  ! * Zomac 2)  Restoril (Temazepam) 3)  Norvasc (Amlodipine Besylate)  Past History:  Past Medical History: Last updated: 03/14/2008 Diabetes mellitus, type  II Hypertension Low back pain palpitations  785.1 ED OSA on cpap Hypogonadism Vit D def. 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 Former Smoker. started at age 32.  less than 1 ppd.  quit 1973 Alcohol use-no Drug use-no  Review of Systems  The patient denies fever, chest pain, and abdominal pain.    Physical Exam  General:  NAD overweight-appearing.   Nose:  External nasal examination shows no deformity or inflammation. Nasal mucosa are pink and moist without lesions or exudates. Mouth:  Oral mucosa and oropharynx without lesions or exudates.  Teeth in good repair. Neck:  No deformities, masses, or tenderness noted. Lungs:  CTA Heart:  RRR Abdomen:  S/NT Msk:  Cervical spine is tender to palpation over paraspinal muscles and with the ROM Lumbar-sacral spine is tender to palpation over paraspinal muscles and painfull with the ROM  Extremities:  No clubbing, cyanosis, edema, or deformity noted with normal full range of motion of all joints.   Neurologic:  No cranial nerve deficits noted. Station and gait are normal. Plantar reflexes are down-going bilaterally. DTRs are symmetrical throughout. Sensory, motor and coordinative functions appear intact. Skin:  Intact without suspicious lesions or  rashes Psych:  Oriented X3. not suicidal, not homicidal, and depressed affect.     Impression & Recommendations:  Problem # 1:  HYPERTENSION (ICD-401.9) Assessment Improved  The following medications were removed from the medication list:    Lotrel 10-40 Mg Caps (Amlodipine besy-benazepril hcl) .Marland Kitchen... 1 by mouth qd His updated medication list for this problem includes:    Propranolol Hcl 80 Mg Tabs (Propranolol hcl) .Marland Kitchen... 1 by mouth tid    Demadex 100 Mg Tabs (Torsemide) .Marland Kitchen... 1 by mouth once daily prn    Benazepril Hcl 40 Mg Tabs  (Benazepril hcl) .Marland Kitchen... 1 by mouth qd    Amlodipine Besylate 10 Mg Tabs (Amlodipine besylate) .Marland Kitchen... 1 by mouth once daily for blood pressure  BP today: 136/64 Prior BP: 158/60 (04/26/2009)  Labs Reviewed: K+: 4.5 (04/22/2009) Creat: : 0.9 (04/22/2009)   Chol: 140 (06/24/2007)   HDL: 21.0 (06/24/2007)   LDL: 83 (06/24/2007)   TG: 180 (06/24/2007)  Problem # 2:  B12 DEFICIENCY (ICD-266.2) Assessment: Comment Only On prescription/OTC  therapy   Problem # 3:  DEPRESSION (ICD-311) Assessment: Deteriorated  The following medications were removed from the medication list:    Prozac 40 Mg Caps (Fluoxetine hcl) ..... Once daily His updated medication list for this problem includes:    Clonazepam 1 Mg Tabs (Clonazepam) .Marland Kitchen... 1 by mouth two times a day prn    Wellbutrin Sr 150 Mg Xr12h-tab (Bupropion hcl) .Marland Kitchen... 1 by mouth bid    Cymbalta 60 Mg Cpep (Duloxetine hcl) .Marland Kitchen... 1 by mouth once daily for depression  Problem # 4:  ATRIAL FIBRILLATION (ICD-427.31) Assessment: Unchanged  The following medications were removed from the medication list:    Lotrel 10-40 Mg Caps (Amlodipine besy-benazepril hcl) .Marland Kitchen... 1 by mouth qd His updated medication list for this problem includes:    Propranolol Hcl 80 Mg Tabs (Propranolol hcl) .Marland Kitchen... 1 by mouth tid    Lanoxin 0.25 Mg Tabs (Digoxin) .Marland Kitchen... 11/2 by mouth once daily    Aspir-low 81 Mg Tbec (Aspirin) .Marland Kitchen... 1 once daily pc    Amlodipine Besylate 10 Mg Tabs (Amlodipine besylate) .Marland Kitchen... 1 by mouth once daily for blood pressure  Complete Medication List: 1)  Propranolol Hcl 80 Mg Tabs (Propranolol hcl) .Marland Kitchen.. 1 by mouth tid 2)  Lotrisone 1-0.05 % Crea (Clotrimazole-betamethasone) .... Two times a day x 1 mo prn 3)  Lanoxin 0.25 Mg Tabs (Digoxin) .Marland Kitchen.. 11/2 by mouth once daily 4)  Clonazepam 1 Mg Tabs (Clonazepam) .Marland Kitchen.. 1 by mouth two times a day prn 5)  Flexeril 10 Mg Tabs (Cyclobenzaprine hcl) .... Take 1 by mouth three times a day qd 6)  Demadex 100 Mg Tabs  (Torsemide) .Marland Kitchen.. 1 by mouth once daily prn 7)  Vitamin D3 1000 Unit Tabs (Cholecalciferol) .Marland Kitchen.. 1 qd 8)  Aspir-low 81 Mg Tbec (Aspirin) .Marland Kitchen.. 1 once daily pc 9)  Vitamin B-12 Cr 1000 Mcg Tbcr (Cyanocobalamin) .... Take one tablet by mouth daily 10)  Wellbutrin Sr 150 Mg Xr12h-tab (Bupropion hcl) .Marland Kitchen.. 1 by mouth bid 11)  Testosterone Cypionate 200 Mg/ml Oil (Testosterone cypionate) .... 2 ml im q 2 wks 12)  Bd Integra Syringe 25g X 1" 3 Ml Misc (Syringe/needle (disp)) .... As dirr 13)  Vit B12 1200mg   .... Once daily 14)  Cymbalta 60 Mg Cpep (Duloxetine hcl) .Marland Kitchen.. 1 by mouth once daily for depression 15)  Benazepril Hcl 40 Mg Tabs (Benazepril hcl) .Marland Kitchen.. 1 by mouth qd 16)  Amlodipine Besylate 10 Mg Tabs (Amlodipine  besylate) .Marland Kitchen.. 1 by mouth once daily for blood pressure 17)  Oxycodone Hcl 15 Mg Tabs (Oxycodone hcl) .Marland Kitchen.. 1 by mouth qid  Patient Instructions: 1)  Please schedule a follow-up appointment in 2 months. 2)  Use stretching exercises that I have provided (15 min. or longer every day)   Prescriptions: PROPRANOLOL HCL 80 MG  TABS (PROPRANOLOL HCL) 1 by mouth tid  #270 x 3   Entered and Authorized by:   Tresa Garter MD   Signed by:   Tresa Garter MD on 06/26/2009   Method used:   Print then Give to Patient   RxID:   [id number] LANOXIN 0.25 MG  TABS (DIGOXIN) 11/2 by mouth once daily  #135 x 3   Entered and Authorized by:   Tresa Garter MD   Signed by:   Tresa Garter MD on 06/26/2009   Method used:   Print then Give to Patient   RxID:   [id number] AMLODIPINE BESYLATE 10 MG TABS (AMLODIPINE BESYLATE) 1 by mouth once daily for blood pressure  #90 x 3   Entered and Authorized by:   Tresa Garter MD   Signed by:   Tresa Garter MD on 06/26/2009   Method used:   Print then Give to Patient   RxID:   [id number] BENAZEPRIL HCL 40 MG TABS (BENAZEPRIL HCL) 1 by mouth qd  #30 x 3   Entered and Authorized by:   Tresa Garter MD    Signed by:   Tresa Garter MD on 06/26/2009   Method used:   Print then Give to Patient   RxID:   [id number] CYMBALTA 60 MG CPEP (DULOXETINE HCL) 1 by mouth once daily for depression  #30 x 6   Entered and Authorized by:   Tresa Garter MD   Signed by:   Tresa Garter MD on 06/26/2009   Method used:   Print then Give to Patient   RxID:   [id number]

## 2010-04-29 NOTE — Assessment & Plan Note (Signed)
Summary: CPX /NWS  #   Vital Signs:  Patient profile:   70 year old male Height:      72 inches Weight:      228 pounds BMI:     31.03 Temp:     99.2 degrees F oral Pulse rate:   64 / minute Pulse rhythm:   regular Resp:     16 per minute BP sitting:   132 / 80  (left arm) Cuff size:   large  Vitals Entered By: Lanier Prude, Beverly Gust) (December 11, 2009 2:04 PM) CC: MWV Is Patient Diabetic? Yes   Primary Care Provider:  Tresa Garter MD  CC:  MWV.  History of Present Illness: The patient presents for a preventive health examination  Patient past medical history, social history, and family history reviewed in detail no significant changes.  Patient is physically active. Depression is negative and mood is good. Hearing is normal, and able to perform activities of daily living. Risk of falling is negligible and home safety has been reviewed and is appropriate. Patient has normal height, overweight, and visual acuity. Patient has been counseled on age-appropriate routine health concerns for screening and prevention. Education, counseling done.  C/o colored sinus d/c x 2 wks The patient presents for a follow up of hypertension, diabetes, LBP  Preventive Screening-Counseling & Management  Alcohol-Tobacco     Alcohol drinks/day: 0     Smoking Status: quit > 6 months  Caffeine-Diet-Exercise     Caffeine Counseling: not indicated; caffeine use is not excessive or problematic     Diet Counseling: not indicated; diet is assessed to be healthy     Nutrition Referrals: no     Does Patient Exercise: yes  Hep-HIV-STD-Contraception     Sun Exposure-Excessive: no  Safety-Violence-Falls     Seat Belt Use: yes      Sexual History:  currently monogamous.    Current Medications (verified): 1)  Avinza 30 Mg Xr24h-Cap (Morphine Sulfate Beads) .Marland Kitchen.. 1 By Mouth Two Times A Day 2)  Oxycodone Hcl 15 Mg Tabs (Oxycodone Hcl) .Marland Kitchen.. 1 By Mouth Qid 3)  Propranolol Hcl 80 Mg  Tabs  (Propranolol Hcl) .Marland Kitchen.. 1 By Mouth Tid 4)  Lotrisone 1-0.05 % Crea (Clotrimazole-Betamethasone) .... Two Times A Day X 1 Mo Prn 5)  Lanoxin 0.25 Mg  Tabs (Digoxin) .Marland Kitchen.. 11/2 By Mouth Once Daily 6)  Clonazepam 1 Mg  Tabs (Clonazepam) .Marland Kitchen.. 1 By Mouth Two Times A Day Prn 7)  Flexeril 10 Mg  Tabs (Cyclobenzaprine Hcl) .... Take 1 By Mouth Three Times A Day Qd 8)  Demadex 100 Mg Tabs (Torsemide) .Marland Kitchen.. 1 By Mouth Once Daily Prn 9)  Vitamin D3 1000 Unit  Tabs (Cholecalciferol) .Marland Kitchen.. 1 Qd 10)  Aspir-Low 81 Mg Tbec (Aspirin) .Marland Kitchen.. 1 Once Daily Pc 11)  Vitamin B-12 Cr 1000 Mcg  Tbcr (Cyanocobalamin) .... Take One Tablet By Mouth Daily 12)  Wellbutrin Sr 150 Mg Xr12h-Tab (Bupropion Hcl) .Marland Kitchen.. 1 By Mouth Bid 13)  Testosterone Cypionate 200 Mg/ml Oil (Testosterone Cypionate) .... 2 Ml Im Q 2 Wks 14)  Bd Integra Syringe 25g X 1" 3 Ml Misc (Syringe/needle (Disp)) .... As Dirr 15)  Cymbalta 60 Mg Cpep (Duloxetine Hcl) .Marland Kitchen.. 1 By Mouth Once Daily For Depression 16)  Benazepril Hcl 40 Mg Tabs (Benazepril Hcl) .Marland Kitchen.. 1 By Mouth Qd 17)  Amlodipine Besylate 10 Mg Tabs (Amlodipine Besylate) .Marland Kitchen.. 1 By Mouth Once Daily For Blood Pressure 18)  Vit B12 1200mg  .... Once Daily  Allergies (verified): 1)  ! * Zomac 2)  Restoril (Temazepam) 3)  Norvasc (Amlodipine Besylate)  Past History:  Past Medical History: Last updated: 03/14/2008 Diabetes mellitus, type II Hypertension Low back pain palpitations  785.1 ED OSA on cpap Hypogonadism Vit D def. 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 Former Smoker. started at age 76.  less than 1 ppd.  quit 1973 Alcohol use-no Drug use-no  Past Surgical History: back surgery  in  87 Cervical laminectomy 2003 Dr Jeral Fruit Appendectomy Cholecystectomy due Colon 2012  Social History: Smoking Status:  quit > 6 months Does Patient Exercise:  yes Sun Exposure-Excessive:  no Seat Belt Use:  yes Sexual History:  currently monogamous  Review of Systems       The patient complains of difficulty walking.  The patient denies anorexia, fever, vision loss, decreased hearing, hoarseness, chest pain, syncope, dyspnea on exertion, peripheral edema, prolonged cough, headaches, hemoptysis, abdominal pain, melena, hematochezia, severe indigestion/heartburn, hematuria, incontinence, genital sores, muscle weakness, suspicious skin lesions, transient blindness, depression, unusual weight change, abnormal bleeding, enlarged lymph nodes, angioedema, and testicular masses.         Lost wt on diet LBP  Physical Exam  General:  NAD overweight-appearing.   Head:  Normocephalic and atraumatic without obvious abnormalities. No apparent alopecia or balding. Eyes:  No corneal or conjunctival inflammation noted. EOMI. Perrla.  Ears:  External ear exam shows no significant lesions or deformities.  Otoscopic examination reveals clear canals, tympanic membranes are intact bilaterally without bulging, retraction, inflammation or discharge. Hearing is grossly normal bilaterally. Nose:  Eryth mucosa Mouth:  Erythematous throat and intranasal mucosa c/w URI  Neck:  No deformities, masses, or tenderness noted. Lungs:  CTA Heart:  RRR Abdomen:  S/NTno inguinal hernia, no hepatomegaly, and no splenomegaly.   Rectal:  no external abnormalities and no masses.  G(-) Genitalia:  Testes bilaterally descended without nodularity, tenderness or masses. No scrotal masses or lesions. No penis lesions or urethral discharge. Prostate:  1+ enlarged.   Msk:  Cervical  spine is tender to palpation over paraspinal muscles and with the ROM Lumbar-sacral spine is tender to palpation over paraspinal muscles and painfull with the ROM  Pulses:  R and L carotid,radial,femoral,dorsalis pedis and posterior tibial pulses are full and equal bilaterally Extremities:  No clubbing, cyanosis, edema, or deformity noted with normal full range of motion of all joints.   Neurologic:  No cranial nerve deficits noted. Station and gait are normal. Plantar reflexes are down-going bilaterally. DTRs are symmetrical throughout. Sensory, motor and coordinative functions appear intact. Skin:  Intact without suspicious lesions or rashes Aging changes are present Cervical Nodes:  No lymphadenopathy noted Psych:  Oriented X3. not suicidal, not homicidal, and depressed affect.     Impression & Recommendations:  Problem # 1:  HEALTH MAINTENANCE EXAM (ICD-V70.0) Assessment New  Overall doing well, age appropriate education and counseling updated and referral for appropriate preventive services done unless declined, immunizations up to date or declined, diet counseling done if overweight, urged to quit smoking if smokes, most recent labs reviewed and current ordered if appropriate, ecg reviewed or declined (interpretation per ECG scanned  in the EMR if done); information regarding Medicare Preventation requirements given if appropriate. The labs were reviewed with the patient.   Orders: Medicare -1st Annual Wellness Visit [phone])  Problem # 2:  LOW BACK PAIN (ICD-724.2) Assessment: Improved Dr Ethelene Hal The following medications were removed from the medication list:    Avinza 30 Mg Xr24h-cap (Morphine sulfate beads) .Marland Kitchen... 1 by mouth two times a day His updated medication list for this problem includes:    Ms Contin 30 Mg Xr12h-tab (Morphine sulfate) .Marland Kitchen... 1 by mouth bid    Oxycodone Hcl 15 Mg Tabs (Oxycodone hcl) .Marland Kitchen... 1 by mouth qid    Flexeril 10 Mg Tabs (Cyclobenzaprine hcl) .Marland Kitchen... Take  1 by mouth three times a day qd    Aspir-low 81 Mg Tbec (Aspirin) .Marland Kitchen... 1 once daily pc  Problem # 3:  B12 DEFICIENCY (ICD-266.2) Assessment: Comment Only On the regimen of medicine(s) reflected in the chart    Problem # 4:  HYPERTENSION (ICD-401.9) Assessment: Improved  His updated medication list for this problem includes:    Propranolol Hcl 80 Mg Tabs (Propranolol hcl) .Marland Kitchen... 1 by mouth tid    Demadex 100 Mg Tabs (Torsemide) .Marland Kitchen... 1 by mouth once daily prn    Benazepril Hcl 40 Mg Tabs (Benazepril hcl) .Marland Kitchen... 1 by mouth qd    Amlodipine Besylate 10 Mg Tabs (Amlodipine besylate) .Marland Kitchen... 1 by mouth once daily for blood pressure  BP today: 132/80 Prior BP: 144/60 (08/27/2009)  Labs Reviewed: K+: 5.1 (12/09/2009) Creat: : 1.0 (12/09/2009)   Chol: 128 (12/09/2009)   HDL: 20.40 (12/09/2009)   LDL: 81 (12/09/2009)   TG: 134.0 (12/09/2009)  Problem # 5:  DEPRESSION (ICD-311) Assessment: Unchanged  His updated medication list for this problem includes:    Clonazepam 1 Mg Tabs (Clonazepam) .Marland Kitchen... 1 by mouth two times a day prn    Wellbutrin Sr 150 Mg Xr12h-tab (Bupropion hcl) .Marland Kitchen... 1 by mouth bid    Cymbalta 60 Mg Cpep (Duloxetine hcl) .Marland Kitchen... 1 by mouth once daily for depression  Problem # 6:  DIABETES MELLITUS, TYPE II (ICD-250.00) on diet Assessment: Unchanged  His updated medication list for this problem includes:    Aspir-low 81 Mg Tbec (Aspirin) .Marland Kitchen... 1 once daily pc    Benazepril Hcl 40 Mg Tabs (Benazepril hcl) .Marland Kitchen... 1 by mouth qd  Labs Reviewed: Creat: 1.0 (12/09/2009)    Reviewed HgBA1c results: 5.5 (12/09/2009)  5.3 (04/22/2009)  Problem # 7:  SINUSITIS, ACUTE (ICD-461.9) Assessment: New  The following medications were removed from the medication list:    Amoxicillin 125 Mg Chw Tab (Amoxicillin) .Marland Kitchen... Take one (1) tablet by mouth three (3) times a day x 10 days His updated medication list for this problem includes:    Amoxicillin 500 Mg Caps (Amoxicillin) .Marland Kitchen... 2 caps by  mouth bid  Complete Medication List: 1)  Ms Contin 30 Mg Xr12h-tab (Morphine sulfate) .Marland Kitchen.. 1 by mouth bid 2)  Oxycodone Hcl 15 Mg Tabs (Oxycodone hcl) .Marland Kitchen.. 1 by mouth qid 3)  Propranolol Hcl 80 Mg Tabs (Propranolol hcl) .Marland Kitchen.. 1 by mouth tid 4)  Lotrisone 1-0.05 % Crea (Clotrimazole-betamethasone) .... Two times a day x 1 mo prn 5)  Lanoxin 0.25 Mg Tabs (Digoxin) .Marland Kitchen.. 11/2 by mouth once daily 6)  Clonazepam 1 Mg Tabs (Clonazepam) .Marland Kitchen.. 1 by mouth two times a day prn 7)  Flexeril 10 Mg Tabs (Cyclobenzaprine hcl) .... Take 1 by mouth three times a day qd 8)  Demadex 100 Mg Tabs (Torsemide) .Marland KitchenMarland KitchenMarland Kitchen  1 by mouth once daily prn 9)  Vitamin D3 1000 Unit Tabs (Cholecalciferol) .Marland Kitchen.. 1 qd 10)  Aspir-low 81 Mg Tbec (Aspirin) .Marland Kitchen.. 1 once daily pc 11)  Vitamin B-12 Cr 1000 Mcg Tbcr (Cyanocobalamin) .... Take one tablet by mouth daily 12)  Wellbutrin Sr 150 Mg Xr12h-tab (Bupropion hcl) .Marland Kitchen.. 1 by mouth bid 13)  Testosterone Cypionate 200 Mg/ml Oil (Testosterone cypionate) .... 2 ml im q 2 wks 14)  Bd Integra Syringe 25g X 1" 3 Ml Misc (Syringe/needle (disp)) .... As dirr 15)  Cymbalta 60 Mg Cpep (Duloxetine hcl) .Marland Kitchen.. 1 by mouth once daily for depression 16)  Benazepril Hcl 40 Mg Tabs (Benazepril hcl) .Marland Kitchen.. 1 by mouth qd 17)  Amlodipine Besylate 10 Mg Tabs (Amlodipine besylate) .Marland Kitchen.. 1 by mouth once daily for blood pressure 18)  Vit B12 1200mg   .... Once daily 19)  Amoxicillin 500 Mg Caps (Amoxicillin) .... 2 caps by mouth bid  Other Orders: Flu Vaccine 31yrs + MEDICARE PATIENTS (Z6109) Administration Flu vaccine - MCR (U0454)  Patient Instructions: 1)  Please schedule a follow-up appointment in 3 months. Prescriptions: AMOXICILLIN 500 MG CAPS (AMOXICILLIN) 2 caps by mouth bid  #40 x 0   Entered and Authorized by:   Tresa Garter MD   Signed by:   Tresa Garter MD on 12/11/2009   Method used:   Print then Give to Patient   RxID:   [id number] AMOXICILLIN 125 MG CHW TAB (AMOXICILLIN) Take  one (1) tablet by mouth three (3) times a day X 10 days  #30 x 0   Entered and Authorized by:   Tresa Garter MD   Signed by:   Tresa Garter MD on 12/11/2009   Method used:   Electronically to        [facility] # [phone]* (retail)       [address]       [place], [place]  [address]       Ph: [phone]       Fax: [phone]   RxID:   [id number]  .[email or web address]   Flu Vaccine Consent Questions     Do you have a history of severe allergic reactions to this vaccine? no    Any prior history of allergic reactions to egg and/or gelatin? no    Do you have a sensitivity to the preservative Thimersol? no    Do you have a past history of Guillan-Barre Syndrome? no    Do you currently have an acute febrile illness? no    Have you ever had a severe reaction to latex? no    Vaccine information given and explained to patient? yes    Are you currently pregnant? no    Lot Number:[id number]   Exp Date:09/27/2010   Site Given  Left Deltoid IM Lanier Prude, [facility])  December 11, 2009 2:14 PM

## 2010-04-29 NOTE — Letter (Signed)
Summary: [facility]  [facility]   Imported By: Lester [place] 09/02/2009 09:33:32  _____________________________________________________________________  External Attachment:    Type:   Image     Comment:   External Document

## 2010-04-29 NOTE — Assessment & Plan Note (Signed)
Summary: 2 MO ROV /NWS   Vital Signs:  Patient profile:   70 year old male Height:      72 inches Weight:      236 pounds BMI:     32.12 O2 Sat:      98 % on Room air Temp:     99.2 degrees F oral Pulse rate:   49 / minute BP sitting:   144 / 60  (left arm) Cuff size:   large  Vitals Entered By: Lucious Groves (Aug 27, 2009 1:39 PM)  O2 Flow:  Room air CC: 2 mo rtn ov./kb Is Patient Diabetic? No Pain Assessment Patient in pain? no        Primary Care Provider:  Georgina Quint Tinna Kolker MD  CC:  2 mo rtn ov./kb.  History of Present Illness: The patient presents for a follow up of back pain, HTN, anxiety, depression and headaches. Dr Ethelene Hal has put him on Avinza - it helped   Current Medications (verified): 1)  Propranolol Hcl 80 Mg  Tabs (Propranolol Hcl) .Marland Kitchen.. 1 By Mouth Tid 2)  Lotrisone 1-0.05 % Crea (Clotrimazole-Betamethasone) .... Two Times A Day X 1 Mo Prn 3)  Lanoxin 0.25 Mg  Tabs (Digoxin) .Marland Kitchen.. 11/2 By Mouth Once Daily 4)  Clonazepam 1 Mg  Tabs (Clonazepam) .Marland Kitchen.. 1 By Mouth Two Times A Day Prn 5)  Flexeril 10 Mg  Tabs (Cyclobenzaprine Hcl) .... Take 1 By Mouth Three Times A Day Qd 6)  Demadex 100 Mg Tabs (Torsemide) .Marland Kitchen.. 1 By Mouth Once Daily Prn 7)  Vitamin D3 1000 Unit  Tabs (Cholecalciferol) .Marland Kitchen.. 1 Qd 8)  Aspir-Low 81 Mg Tbec (Aspirin) .Marland Kitchen.. 1 Once Daily Pc 9)  Vitamin B-12 Cr 1000 Mcg  Tbcr (Cyanocobalamin) .... Take One Tablet By Mouth Daily 10)  Wellbutrin Sr 150 Mg Xr12h-Tab (Bupropion Hcl) .Marland Kitchen.. 1 By Mouth Bid 11)  Testosterone Cypionate 200 Mg/ml Oil (Testosterone Cypionate) .... 2 Ml Im Q 2 Wks 12)  Bd Integra Syringe 25g X 1" 3 Ml Misc (Syringe/needle (Disp)) .... As Dirr 13)  Vit B12 1200mg  .... Once Daily 14)  Cymbalta 60 Mg Cpep (Duloxetine Hcl) .Marland Kitchen.. 1 By Mouth Once Daily For Depression 15)  Benazepril Hcl 40 Mg Tabs (Benazepril Hcl) .Marland Kitchen.. 1 By Mouth Qd 16)  Amlodipine Besylate 10 Mg Tabs (Amlodipine Besylate) .Marland Kitchen.. 1 By Mouth Once Daily For Blood  Pressure 17)  Oxycodone Hcl 15 Mg Tabs (Oxycodone Hcl) .Marland Kitchen.. 1 By Mouth Qid 18)  Avinza 30 Mg Xr24h-Cap (Morphine Sulfate Beads) .Marland Kitchen.. 1 By Mouth Qd  Allergies (verified): 1)  ! * Zomac 2)  Restoril (Temazepam) 3)  Norvasc (Amlodipine Besylate)  Past History:  Past Medical History: Last updated: 03/14/2008 Diabetes mellitus, type II Hypertension Low back pain palpitations  785.1 ED OSA on cpap Hypogonadism Vit D def. 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 Former Smoker. started at age 16.  less than 1 ppd.  quit 1973 Alcohol use-no Drug use-no  Review of Systems  The patient denies fever, dyspnea on exertion, abdominal pain, and melena.    Physical Exam  General:  NAD overweight-appearing.   Nose:  External nasal examination shows no deformity or inflammation. Nasal mucosa are pink and moist without lesions or exudates. Mouth:  Oral mucosa and oropharynx without lesions or exudates.  Teeth in good repair. Lungs:  CTA Heart:  RRR Abdomen:  S/NT Msk:  Cervical spine is tender to palpation over paraspinal muscles and with the ROM Lumbar-sacral spine is tender to palpation over paraspinal muscles and painfull with the ROM  Extremities:  No clubbing, cyanosis, edema, or deformity noted with normal full range of motion of all joints.   Neurologic:  No cranial nerve deficits noted. Station and gait are normal. Plantar reflexes are down-going bilaterally. DTRs are symmetrical throughout. Sensory, motor and coordinative functions appear intact. Skin:  Intact without suspicious lesions or rashes Psych:  Oriented X3. not suicidal, not homicidal, and depressed affect.     Impression & Recommendations:  Problem # 1:  LOW BACK PAIN (ICD-724.2) Assessment Unchanged  His updated medication list for this problem includes:     Avinza 30 Mg Xr24h-cap (Morphine sulfate beads) .Marland Kitchen... 1 by mouth qd    Oxycodone Hcl 15 Mg Tabs (Oxycodone hcl) .Marland Kitchen... 1 by mouth qid    Flexeril 10 Mg Tabs (Cyclobenzaprine hcl) .Marland Kitchen... Take 1 by mouth three times a day qd    Aspir-low 81 Mg Tbec (Aspirin) .Marland Kitchen... 1 once daily pc  Orders: Prescription Created Electronically [phone])  Problem # 2:  B12 DEFICIENCY (ICD-266.2) Assessment: Unchanged On OTC therapy   Problem # 3:  OSTEOARTHRITIS (ICD-715.90) Assessment: Unchanged  His updated medication list for this problem includes:    Avinza 30 Mg Xr24h-cap (Morphine sulfate beads) .Marland Kitchen... 1 by mouth qd    Oxycodone Hcl 15 Mg Tabs (Oxycodone hcl) .Marland Kitchen... 1 by mouth qid    Aspir-low 81 Mg Tbec (Aspirin) .Marland Kitchen... 1 once daily pc  Problem # 4:  HYPERTENSION (ICD-401.9) Assessment: Unchanged  His updated medication list for this problem includes:    Propranolol Hcl 80 Mg Tabs (Propranolol hcl) .Marland Kitchen... 1 by mouth tid    Demadex 100 Mg Tabs (Torsemide) .Marland Kitchen... 1 by mouth once daily prn    Benazepril Hcl 40 Mg Tabs (Benazepril hcl) .Marland Kitchen... 1 by mouth qd    Amlodipine Besylate 10 Mg Tabs (Amlodipine besylate) .Marland Kitchen... 1 by mouth once daily for blood pressure  Problem # 5:  DEPRESSION (ICD-311) Assessment: Unchanged  His updated medication list for this problem includes:    Clonazepam 1 Mg Tabs (Clonazepam) .Marland Kitchen... 1 by mouth two times a day prn    Wellbutrin Sr 150 Mg Xr12h-tab (Bupropion hcl) .Marland Kitchen... 1 by mouth bid    Cymbalta 60 Mg Cpep (Duloxetine hcl) .Marland Kitchen... 1 by mouth once daily for depression  Problem # 6:  DIABETES MELLITUS, TYPE II (ICD-250.00) Assessment: Comment Only  His updated medication list for this problem includes:    Aspir-low 81 Mg Tbec (Aspirin) .Marland Kitchen... 1 once daily pc    Benazepril Hcl 40 Mg Tabs (Benazepril hcl) .Marland Kitchen... 1 by mouth qd  Complete Medication List: 1)  Avinza 30 Mg Xr24h-cap (Morphine sulfate beads) .Marland Kitchen.. 1 by mouth qd 2)  Oxycodone Hcl 15 Mg Tabs (Oxycodone hcl) .Marland Kitchen.. 1 by  mouth qid 3)  Propranolol Hcl 80 Mg Tabs (Propranolol hcl) .Marland Kitchen.. 1 by mouth tid 4)  Lotrisone 1-0.05 % Crea (Clotrimazole-betamethasone) .... Two times a day x 1 mo prn 5)  Lanoxin 0.25 Mg Tabs (Digoxin) .Marland Kitchen.. 11/2 by mouth once daily 6)  Clonazepam 1 Mg Tabs (Clonazepam) .Marland Kitchen.. 1 by mouth two times a day prn 7)  Flexeril 10 Mg Tabs (Cyclobenzaprine hcl) .... Take 1 by mouth three times a day qd 8)  Demadex 100 Mg Tabs (Torsemide) .Marland Kitchen.. 1 by mouth once daily prn 9)  Vitamin D3 1000 Unit Tabs (Cholecalciferol) .Marland KitchenMarland KitchenMarland Kitchen  1 qd 10)  Aspir-low 81 Mg Tbec (Aspirin) .Marland Kitchen.. 1 once daily pc 11)  Vitamin B-12 Cr 1000 Mcg Tbcr (Cyanocobalamin) .... Take one tablet by mouth daily 12)  Wellbutrin Sr 150 Mg Xr12h-tab (Bupropion hcl) .Marland Kitchen.. 1 by mouth bid 13)  Testosterone Cypionate 200 Mg/ml Oil (Testosterone cypionate) .... 2 ml im q 2 wks 14)  Bd Integra Syringe 25g X 1" 3 Ml Misc (Syringe/needle (disp)) .... As dirr 15)  Cymbalta 60 Mg Cpep (Duloxetine hcl) .Marland Kitchen.. 1 by mouth once daily for depression 16)  Benazepril Hcl 40 Mg Tabs (Benazepril hcl) .Marland Kitchen.. 1 by mouth qd 17)  Amlodipine Besylate 10 Mg Tabs (Amlodipine besylate) .Marland Kitchen.. 1 by mouth once daily for blood pressure 18)  Vit B12 1200mg   .... Once daily  Other Orders: Pneumococcal Vaccine ([address]) Admin 1st Vaccine (60454) Admin 1st Vaccine [facility]) [phone])  Patient Instructions: 1)  Please schedule a follow-up appointment in 3 months well w/labs. 2)  HbgA1C prior to visit, ICD-9:790.29 3)  Vit B12 266.20 Prescriptions: FLEXERIL 10 MG  TABS (CYCLOBENZAPRINE HCL) take 1 by mouth three times a day qd  #90 Each x 4   Entered and Authorized by:   Tresa Garter MD   Signed by:   Tresa Garter MD on 08/27/2009   Method used:   [facility] to        [facility]  [address] [phone]* (retail)       [address]       [place], [place]  [address]       Ph: [phone] or [phone]       Fax: [phone]   RxID:   [id number]     Aug 27, 2009   RONNIE DOO [address] [place], [place] [address]  RE:  LAB RESULTS  Dear  Mr. Vieth,  The following is an interpretation of your most recent lab tests.  Please take note of any instructions provided or changes to medications that have resulted from your lab work.      Medications Prescribed or Changed AVINZA 30 MG XR24H-CAP (MORPHINE SULFATE BEADS) 1 by mouth qd    Pneumovax Vaccine    Vaccine Type: Pneumovax    Site: left deltoid    Mfr: Merck    Dose: 0.5 ml    Route: IM    Given by: Lucious Groves    Exp. Date: 01/22/2011    Lot #: [id number]    VIS given: 10/26/95 version given Aug 27, 2009.

## 2010-04-29 NOTE — Medication Information (Signed)
Summary: [employer] [facility]  [employer]   Imported By: Lester [place] 05/22/2009 08:21:42  _____________________________________________________________________  External Attachment:    Type:   Image     Comment:   External Document

## 2010-04-29 NOTE — Letter (Signed)
Summary: [facility]  [facility]   Imported By: Sherian Rein 01/07/2010 11:35:00  _____________________________________________________________________  External Attachment:    Type:   Image     Comment:   External Document

## 2010-04-29 NOTE — Progress Notes (Signed)
Summary: RF Clonazepam  Phone Note Refill Request Message from:  Fax from Pharmacy  Refills Requested: Medication #1:  CLONAZEPAM 1 MG  TABS 1 by mouth two times a day prn   Dosage confirmed as above?Dosage Confirmed   Supply Requested: 60   Last Ref[facility]: 10/04/2009 [facility] [facility]  [phone]   Method Requested: Telephone to Pharmacy Next Appointment Scheduled: 11-2009 Initial call taken by: Lanier Prude, [facility]),  November 04, 2009 11:34 AM  Follow-up for Phone Call        ok x 6 ref Follow-up by: Tresa Garter MD,  November 05, 2009 1:26 PM    Prescriptions: CLONAZEPAM 1 MG  TABS (CLONAZEPAM) 1 by mouth two times a day prn  #60 x 5   Entered by:   Lamar Sprinkles, CMA   Authorized by:   Tresa Garter MD   Signed by:   Lamar Sprinkles, CMA on 11/05/2009   Method used:   Telephoned to ...       [facility]  [address] [phone]* (retail)       [address]       [place], [place]  [address]       Ph: [phone] or [phone]       Fax: [phone]   RxID:   [id number]

## 2010-04-29 NOTE — Letter (Signed)
Summary: [facility]  [facility]   Imported By: Lester [place] 10/17/2009 10:42:30  _____________________________________________________________________  External Attachment:    Type:   Image     Comment:   External Document

## 2010-04-29 NOTE — Progress Notes (Signed)
Summary: OXYCODONE  Phone Note Call from Patient Call back at [facility] Phone [phone]   Summary of Call: Pt is out of oxycodone and would like to fill rx early. He had to use "a few extra" pills last month due to increased pain.  Initial call taken by: Lamar Sprinkles, CMA,  April 29, 2009 10:17 AM  Follow-up for Phone Call        OK to fill early.  Pls stay w/120 per month Follow-up by: Tresa Garter MD,  April 29, 2009 12:54 PM  Additional Follow-up for Phone Call Additional follow up Details #1::        left mess to call office back, Will need pt to return previous rx's and reprint feb and march to reflect fill dates of 2/1 and 3/1.....................Marland KitchenLamar Sprinkles, CMA  April 29, 2009 4:55 PM     Additional Follow-up for Phone Call Additional follow up Details #2::    Patient notified and will come on in with prescriptions. Printed for MD to sign. Follow-up by: Lucious Groves,  April 30, 2009 9:09 AM  Additional Follow-up for Phone Call Additional follow up Details #3:: Details for Additional Follow-up Action Taken: RX's were signed and given to the patient. Old prescription shredded. Additional Follow-up by: Lucious Groves,  April 30, 2009 9:58 AM  New/Updated Medications: OXYCODONE HCL 15 MG  TABS (OXYCODONE HCL) 1 by mouth qid as needed pain pls. fill on or after 04/30/2009 OXYCODONE HCL 15 MG  TABS (OXYCODONE HCL) 1 by mouth qid as needed pain pls. fill on or after 05/28/2009 Prescriptions: OXYCODONE HCL 15 MG  TABS (OXYCODONE HCL) 1 by mouth qid as needed pain pls. fill on or after 05/28/2009  #120 x 0   Entered by:   Lucious Groves   Authorized by:   Tresa Garter MD   Signed by:   Lucious Groves on 04/30/2009   Method used:   Print then Give to Patient   RxID:   [id number] OXYCODONE HCL 15 MG  TABS (OXYCODONE HCL) 1 by mouth qid as needed pain pls. fill on or after 04/30/2009  #120 x 0   Entered by:   Lucious Groves   Authorized by:   Tresa Garter MD  Signed by:   Lucious Groves on 04/30/2009   Method used:   Print then Give to Patient   RxID:   [id number]

## 2010-05-01 NOTE — Assessment & Plan Note (Signed)
Summary: 3 MO ROV /NWS   Vital Signs:  Patient profile:   70 year old male Height:      72 inches Weight:      228 pounds BMI:     31.03 Temp:     98.4 degrees F oral Pulse rate:   56 / minute Pulse rhythm:   regular Resp:     16 per minute BP sitting:   132 / 68  (left arm) Cuff size:   large  Vitals Entered By: Lanier Prude, Beverly Gust) (March 17, 2010 1:49 PM) CC: 3 mo f/u  Is Patient Diabetic? Yes Comments pt is not taking Cymbalta because it caused severe constipation   Primary Care Provider:  Tresa Garter MD  CC:  3 mo f/u .  History of Present Illness: The patient presents for a follow up of back pain, anxiety, depression and A fib C/o slow HR x 30 min  39-40 bpm after he would take Lanoxin 1.5 tab/d  Current Medications (verified): 1)  Ms Contin 30 Mg Xr12h-Tab (Morphine Sulfate) .Marland Kitchen.. 1 By Mouth Bid 2)  Oxycodone Hcl 15 Mg Tabs (Oxycodone Hcl) .Marland Kitchen.. 1 By Mouth Qid 3)  Propranolol Hcl 80 Mg  Tabs (Propranolol Hcl) .Marland Kitchen.. 1 By Mouth Tid 4)  Lotrisone 1-0.05 % Crea (Clotrimazole-Betamethasone) .... Two Times A Day X 1 Mo Prn 5)  Lanoxin 0.25 Mg  Tabs (Digoxin) .Marland Kitchen.. 11/2 By Mouth Once Daily 6)  Clonazepam 1 Mg  Tabs (Clonazepam) .Marland Kitchen.. 1 By Mouth Two Times A Day Prn 7)  Flexeril 10 Mg  Tabs (Cyclobenzaprine Hcl) .... Take 1 By Mouth Three Times A Day Qd 8)  Demadex 100 Mg Tabs (Torsemide) .Marland Kitchen.. 1 By Mouth Once Daily Prn 9)  Vitamin D3 1000 Unit  Tabs (Cholecalciferol) .Marland Kitchen.. 1 Qd 10)  Aspir-Low 81 Mg Tbec (Aspirin) .Marland Kitchen.. 1 Once Daily Pc 11)  Vitamin B-12 Cr 1000 Mcg  Tbcr (Cyanocobalamin) .... Take One Tablet By Mouth Daily 12)  Wellbutrin Sr 150 Mg Xr12h-Tab (Bupropion Hcl) .Marland Kitchen.. 1 By Mouth Bid 13)  Testosterone Cypionate 200 Mg/ml Oil (Testosterone Cypionate) .... 2 Ml Im Q 2 Wks 14)  Bd Integra Syringe 25g X 1" 3 Ml Misc (Syringe/needle (Disp)) .... As Dirr 15)  Cymbalta 60 Mg Cpep (Duloxetine Hcl) .Marland Kitchen.. 1 By Mouth Once Daily For Depression 16)  Benazepril Hcl 40  Mg Tabs (Benazepril Hcl) .Marland Kitchen.. 1 By Mouth Qd 17)  Amlodipine Besylate 10 Mg Tabs (Amlodipine Besylate) .Marland Kitchen.. 1 By Mouth Once Daily For Blood Pressure 18)  Vit B12 1200mg  .... Once Daily  Allergies (verified): 1)  ! * Zomac 2)  Restoril (Temazepam) 3)  Norvasc (Amlodipine Besylate)  Physical Exam  General:  NAD overweight-appearing.   Nose:  Eryth mucosa Mouth:  Erythematous throat and intranasal mucosa c/w URI  Lungs:  CTA Heart:  RRR Abdomen:  S/NTno inguinal hernia, no hepatomegaly, and no splenomegaly.   Msk:  Cervical spine is tender to palpation over paraspinal muscles and with the ROM Lumbar-sacral spine is tender to palpation over paraspinal muscles and painfull with the ROM  Extremities:  No clubbing, cyanosis, edema, or deformity noted with normal full range of motion of all joints.   Neurologic:  No cranial nerve deficits noted. Station and gait are normal. Plantar reflexes are down-going bilaterally. DTRs are symmetrical throughout. Sensory, motor and coordinative functions appear intact. Skin:  Intact without suspicious lesions or rashes Aging changes are present Psych:  Oriented X3. not suicidal, not homicidal, and depressed affect.  Impression & Recommendations:  Problem # 1:  BRADYCARDIA (ICD-427.89) Assessment New Cut back on Lanoxin His updated medication list for this problem includes:    Propranolol Hcl 80 Mg Tabs (Propranolol hcl) .Marland Kitchen... 1 by mouth three times a day - may need to  reduce the dose too    Aspir-low 81 Mg Tbec (Aspirin) .Marland Kitchen... 1 once daily pc  Problem # 2:  B12 DEFICIENCY (ICD-266.2) Assessment: Unchanged On the regimen of medicine(s) reflected in the chart    Problem # 3:  DIABETES MELLITUS, TYPE II (ICD-250.00) Assessment: Unchanged  His updated medication list for this problem includes:    Aspir-low 81 Mg Tbec (Aspirin) .Marland Kitchen... 1 once daily pc    Benazepril Hcl 40 Mg Tabs (Benazepril hcl) .Marland Kitchen... 1 by mouth qd  Problem # 4:  LOW BACK  PAIN (ICD-724.2) Assessment: Unchanged  His updated medication list for this problem includes:    Ms Contin 30 Mg Xr12h-tab (Morphine sulfate) .Marland Kitchen... 1 by mouth two times a day please,  fill on or after  05/26/10          . ok to fill 1-2 day earlier when the month has 31 days in it.    Oxycodone Hcl 15 Mg Tabs (Oxycodone hcl) .Marland Kitchen... 1 by mouth up  to qid.  please,  fill on or after     05/26/10       . ok to fill 1-2 day earlier when the month has 31 days in it.    Flexeril 10 Mg Tabs (Cyclobenzaprine hcl) .Marland Kitchen... Take 1 by mouth three times a day qd    Aspir-low 81 Mg Tbec (Aspirin) .Marland Kitchen... 1 once daily pc  Complete Medication List: 1)  Ms Contin 30 Mg Xr12h-tab (Morphine sulfate) .Marland Kitchen.. 1 by mouth two times a day please,  fill on or after  05/26/10          . ok to fill 1-2 day earlier when the month has 31 days in it. 2)  Oxycodone Hcl 15 Mg Tabs (Oxycodone hcl) .Marland Kitchen.. 1 by mouth up  to qid.  please,  fill on or after     05/26/10       . ok to fill 1-2 day earlier when the month has 31 days in it. 3)  Propranolol Hcl 80 Mg Tabs (Propranolol hcl) .Marland Kitchen.. 1 by mouth tid 4)  Lotrisone 1-0.05 % Crea (Clotrimazole-betamethasone) .... Two times a day x 1 mo prn 5)  Lanoxin 0.25 Mg Tabs (Digoxin) .Marland Kitchen.. 1 by mouth once daily 6)  Clonazepam 1 Mg Tabs (Clonazepam) .Marland Kitchen.. 1 by mouth two times a day prn 7)  Flexeril 10 Mg Tabs (Cyclobenzaprine hcl) .... Take 1 by mouth three times a day qd 8)  Demadex 100 Mg Tabs (Torsemide) .Marland Kitchen.. 1 by mouth once daily prn 9)  Vitamin D3 1000 Unit Tabs (Cholecalciferol) .Marland Kitchen.. 1 qd 10)  Aspir-low 81 Mg Tbec (Aspirin) .Marland Kitchen.. 1 once daily pc 11)  Vitamin B-12 Cr 1000 Mcg Tbcr (Cyanocobalamin) .... Take one tablet by mouth daily 12)  Wellbutrin Sr 150 Mg Xr12h-tab (Bupropion hcl) .Marland Kitchen.. 1 by mouth bid 13)  Testosterone Cypionate 200 Mg/ml Oil (Testosterone cypionate) .... 2 ml im q 2 wks 14)  Bd Integra Syringe 25g X 1" 3 Ml Misc (Syringe/needle (disp)) .... As dirr 15)  Cymbalta 60 Mg Cpep  (Duloxetine hcl) .Marland Kitchen.. 1 by mouth once daily for depression 16)  Benazepril Hcl 40 Mg Tabs (Benazepril hcl) .Marland Kitchen.. 1 by mouth qd 17)  Amlodipine Besylate 10 Mg Tabs (Amlodipine besylate) .Marland Kitchen.. 1 by mouth once daily for blood pressure 18)  Vit B12 1200mg   .... Once daily  Patient Instructions: 1)  Please schedule a follow-up appointment in 3 months. 2)  BMP prior to visit, ICD-9:250.00 3)  HbgA1C prior to visit, ICD-9: Prescriptions: OXYCODONE HCL 15 MG TABS (OXYCODONE HCL) 1 by mouth up  to qid.  Please,  fill on or after     05/26/10       . OK to fill 1-2 day earlier when the month has 31 days in it.  #90 x 0   Entered and Authorized by:   Tresa Garter MD   Signed by:   Tresa Garter MD on 03/17/2010   Method used:   Print then Give to Patient   RxID:   [id number] MS CONTIN 30 MG XR12H-TAB (MORPHINE SULFATE) 1 by mouth two times a day Please,  fill on or after  05/26/10          . OK to fill 1-2 day earlier when the month has 31 days in it.  #60 x 0   Entered and Authorized by:   Tresa Garter MD   Signed by:   Tresa Garter MD on 03/17/2010   Method used:   Print then Give to Patient   RxID:   [id number] OXYCODONE HCL 15 MG TABS (OXYCODONE HCL) 1 by mouth up  to qid.  Please,  fill on or after     04/25/10       . OK to fill 1-2 day earlier when the month has 31 days in it.  #90 x 0   Entered and Authorized by:   Tresa Garter MD   Signed by:   Tresa Garter MD on 03/17/2010   Method used:   Print then Give to Patient   RxID:   [id number] MS CONTIN 30 MG XR12H-TAB (MORPHINE SULFATE) 1 by mouth two times a day Please,  fill on or after  04/25/10          . OK to fill 1-2 day earlier when the month has 31 days in it.  #60 x 0   Entered and Authorized by:   Tresa Garter MD   Signed by:   Tresa Garter MD on 03/17/2010   Method used:   Print then Give to Patient   RxID:   [id number] OXYCODONE HCL 15 MG TABS (OXYCODONE HCL)  1 by mouth up  to qid.  Please,  fill on or after     03/25/10       . OK to fill 1-2 day earlier when the month has 31 days in it.  #90 x 0   Entered and Authorized by:   Tresa Garter MD   Signed by:   Tresa Garter MD on 03/17/2010   Method used:   Print then Give to Patient   RxID:   [id number] MS CONTIN 30 MG XR12H-TAB (MORPHINE SULFATE) 1 by mouth two times a day Please,  fill on or after  03/25/10          . OK to fill 1-2 day earlier when the month has 31 days in it.  #60 x 0   Entered and Authorized by:   Tresa Garter MD   Signed by:   Tresa Garter MD on 03/17/2010   Method used:   Print then Give to Patient   RxID:  [id number]    Orders Added: 1)  Est. Patient Level IV [10272]

## 2010-05-15 ENCOUNTER — Telehealth: Payer: Self-pay | Admitting: Internal Medicine

## 2010-05-19 ENCOUNTER — Telehealth: Payer: Self-pay | Admitting: Internal Medicine

## 2010-05-20 ENCOUNTER — Encounter: Payer: Self-pay | Admitting: Internal Medicine

## 2010-05-26 ENCOUNTER — Telehealth: Payer: Self-pay | Admitting: Internal Medicine

## 2010-05-27 NOTE — Progress Notes (Signed)
Summary: Rf Clonazepam  Phone Note Refill Request Message from:  Fax from Pharmacy  Refills Requested: Medication #1:  CLONAZEPAM 1 MG  TABS 1 by mouth two times a day prn   Dosage confirmed as above?Dosage Confirmed   Supply Requested: 60   Last Ref[facility]: [facility]   Method Requested: Telephone to Pharmacy Next Appointment Scheduled: 06-16-10 Initial call taken by: Lanier Prude, [facility]),  May 19, 2010 12:06 PM  Follow-up for Phone Call        ok x 3 Follow-up by: Tresa Garter MD,  May 19, 2010 9:33 PM  Additional Follow-up for Phone Call Additional follow up Details #1::        Rx called to pharmacy Additional Follow-up by: Lanier Prude, [facility]),  May 20, 2010 2:36 PM    Prescriptions: CLONAZEPAM 1 MG  TABS (CLONAZEPAM) 1 by mouth two times a day prn  #60 x 3   Entered by:   Lanier Prude, [facility])   Authorized by:   Tresa Garter MD   Signed by:   Lanier Prude, CMA(AAMA) on 05/20/2010   Method used:   Telephoned to ...       [facility] [address] [phone]* (retail)       [address]       [place], [place]  [address]       Ph: [phone] or [phone]       Fax: [phone]   RxID:   [id number]

## 2010-05-27 NOTE — Progress Notes (Signed)
Summary: Aware MD out of office  Phone Note Call from Patient Call back at Home Phone [phone]   Caller: Patient Summary of Call: Patient called [facility] stating that he stopped taking prozac some time ago but would like to now restart due to recent drepression and anxiety. Please advise Initial call taken by: Rock Nephew CMA,  May 15, 2010 2:23 PM  Follow-up for Phone Call        on many psycho tropics: cymbalta, klonipin and others. Needs ov with Dr. AVP before adding new meds or changing meds.  Follow-up by: Jacques Navy MD,  May 15, 2010 6:02 PM  Additional Follow-up for Phone Call Additional follow up Details #1::        Pt informed and would like message to wait until MD returns next week.  Additional Follow-up by: Lamar Sprinkles, CMA,  May 15, 2010 6:36 PM    Additional Follow-up for Phone Call Additional follow up Details #2::    ok to restart - same dose Follow-up by: Tresa Garter MD,  May 18, 2010 12:40 PM  Additional Follow-up for Phone Call Additional follow up Details #3:: Details for Additional Follow-up Action Taken: Pt informed  Additional Follow-up by: Lamar Sprinkles, CMA,  May 19, 2010 9:40 AM  New/Updated Medications: PROZAC 40 MG CAPS (FLUOXETINE HCL) once daily Prescriptions: PROZAC 40 MG CAPS (FLUOXETINE HCL) once daily  #90 x 1   Entered by:   Lamar Sprinkles, CMA   Authorized by:   Tresa Garter MD   Signed by:   Lamar Sprinkles, CMA on 05/19/2010   Method used:   Electronically to        [facility] [address] [phone]* (retail)       [address]       [place], [place]  [address]       Ph: [phone] or [phone]       Fax: [phone]   RxID:   [id number]

## 2010-06-05 NOTE — Op Note (Signed)
Summary: Epidural Steroid Injection / [facility]  [facility]   Imported By: Lennie Odor 05/30/2010 15:22:41  _____________________________________________________________________  External Attachment:    Type:   Image     Comment:   External Document

## 2010-06-05 NOTE — Progress Notes (Signed)
Summary: refill  Phone Note Refill Request   Refills Requested: Medication #1:  BENAZEPRIL HCL 40 MG TABS 1 by mouth qd Initial call taken by: Burnard Leigh [facility]),  May 26, 2010 11:21 AM    Prescriptions: BENAZEPRIL HCL 40 MG TABS (BENAZEPRIL HCL) 1 by mouth qd  #30 x 5   Entered by:   Burnard Leigh CMA(AAMA)   Authorized by:   Tresa Garter MD   Signed by:   Burnard Leigh CMA(AAMA) on 05/26/2010   Method used:   [facility] to        [facility] [address] [phone]* (retail)       [address]       [place], [place]  [address]       Ph: [phone] or [phone]       Fax: [phone]   RxID:   [id number]

## 2010-06-10 ENCOUNTER — Other Ambulatory Visit: Payer: Medicare Other

## 2010-06-10 ENCOUNTER — Encounter (INDEPENDENT_AMBULATORY_CARE_PROVIDER_SITE_OTHER): Payer: Self-pay | Admitting: *Deleted

## 2010-06-10 ENCOUNTER — Other Ambulatory Visit: Payer: Self-pay | Admitting: Internal Medicine

## 2010-06-10 DIAGNOSIS — E119 Type 2 diabetes mellitus without complications: Secondary | ICD-10-CM

## 2010-06-10 LAB — BASIC METABOLIC PANEL
BUN: 14 mg/dL (ref 6–23)
Calcium: 9.1 mg/dL (ref 8.4–10.5)
Creatinine, Ser: 0.7 mg/dL (ref 0.4–1.5)
GFR: 113.05 mL/min (ref >60.00)

## 2010-06-10 LAB — HEMOGLOBIN A1C: Hgb A1c MFr Bld: 5.5 % (ref 4.6–6.5)

## 2010-06-16 ENCOUNTER — Encounter: Payer: Self-pay | Admitting: Internal Medicine

## 2010-06-16 ENCOUNTER — Ambulatory Visit (INDEPENDENT_AMBULATORY_CARE_PROVIDER_SITE_OTHER): Payer: Medicare Other | Admitting: Internal Medicine

## 2010-06-16 DIAGNOSIS — D485 Neoplasm of uncertain behavior of skin: Secondary | ICD-10-CM | POA: Insufficient documentation

## 2010-06-16 DIAGNOSIS — I10 Essential (primary) hypertension: Secondary | ICD-10-CM

## 2010-06-16 DIAGNOSIS — E119 Type 2 diabetes mellitus without complications: Secondary | ICD-10-CM

## 2010-06-16 DIAGNOSIS — E538 Deficiency of other specified B group vitamins: Secondary | ICD-10-CM

## 2010-06-16 DIAGNOSIS — E291 Testicular hypofunction: Secondary | ICD-10-CM

## 2010-06-16 DIAGNOSIS — F329 Major depressive disorder, single episode, unspecified: Secondary | ICD-10-CM

## 2010-06-26 NOTE — Assessment & Plan Note (Signed)
Summary: 3 MON FU-LB   Vital Signs:  Patient profile:   70 year old male Height:      72 inches Weight:      218 pounds BMI:     29.67 Temp:     99.1 degrees F oral Pulse rate:   56 / minute Pulse rhythm:   regular Resp:     16 per minute BP sitting:   130 / 62  (left arm) Cuff size:   regular  Vitals Entered By: Lanier Prude, CMA(AAMA) (June 16, 2010 1:34 PM) CC: 3 mo f/u  c/o weak urine stream X  1 mo Is Patient Diabetic? Yes Comments pt is not taking Cymbalta   Primary Care Provider:  Tresa Garter MD  CC:  3 mo f/u  c/o weak urine stream X  1 mo.  History of Present Illness: The patient presents for a follow up of back pain, anxiety, depression and OA.  Current Medications (verified): 1)  Ms Contin 30 Mg Xr12h-Tab (Morphine Sulfate) .Marland Kitchen.. 1 By Mouth Two Times A Day Please,  Fill On or After  05/26/10          . Ok To Fill 1-2 Day Earlier When The Month Has 31 Days in It. 2)  Oxycodone Hcl 15 Mg Tabs (Oxycodone Hcl) .Marland Kitchen.. 1 By Mouth Up  To Qid.  Please,  Fill On or After     05/26/10       . Ok To Fill 1-2 Day Earlier When The Month Has 31 Days in It. 3)  Propranolol Hcl 80 Mg  Tabs (Propranolol Hcl) .Marland Kitchen.. 1 By Mouth Tid 4)  Lotrisone 1-0.05 % Crea (Clotrimazole-Betamethasone) .... Two Times A Day X 1 Mo Prn 5)  Lanoxin 0.25 Mg  Tabs (Digoxin) .Marland Kitchen.. 1 By Mouth Once Daily 6)  Clonazepam 1 Mg  Tabs (Clonazepam) .Marland Kitchen.. 1 By Mouth Two Times A Day Prn 7)  Flexeril 10 Mg  Tabs (Cyclobenzaprine Hcl) .... Take 1 By Mouth Three Times A Day Qd 8)  Demadex 100 Mg Tabs (Torsemide) .Marland Kitchen.. 1 By Mouth Once Daily Prn 9)  Vitamin D3 1000 Unit  Tabs (Cholecalciferol) .Marland Kitchen.. 1 Qd 10)  Aspir-Low 81 Mg Tbec (Aspirin) .Marland Kitchen.. 1 Once Daily Pc 11)  Vitamin B-12 Cr 1000 Mcg  Tbcr (Cyanocobalamin) .... Take One Tablet By Mouth Daily 12)  Wellbutrin Sr 150 Mg Xr12h-Tab (Bupropion Hcl) .Marland Kitchen.. 1 By Mouth Bid 13)  Testosterone Cypionate 200 Mg/ml Oil (Testosterone Cypionate) .... 2 Ml Im Q 2 Wks 14)  Bd  Integra Syringe 25g X 1" 3 Ml Misc (Syringe/needle (Disp)) .... As Dirr 15)  Cymbalta 60 Mg Cpep (Duloxetine Hcl) .Marland Kitchen.. 1 By Mouth Once Daily For Depression 16)  Benazepril Hcl 40 Mg Tabs (Benazepril Hcl) .Marland Kitchen.. 1 By Mouth Qd 17)  Amlodipine Besylate 10 Mg Tabs (Amlodipine Besylate) .Marland Kitchen.. 1 By Mouth Once Daily For Blood Pressure 18)  Vit B12 1200mg  .... Once Daily 19)  Prozac 40 Mg Caps (Fluoxetine Hcl) .... Once Daily  Allergies (verified): 1)  ! * Zomac 2)  Restoril (Temazepam) 3)  Norvasc (Amlodipine Besylate) 4)  Cymbalta  Past History:  Past Medical History: Last updated: 03/14/2008 Diabetes mellitus, type II Hypertension Low back pain palpitations  785.1 ED OSA on cpap Hypogonadism Vit D def. Hand OA Hyperlipidemia Atrial fibrillation Depression/PTSD Osteoarthritis  Past Surgical History: Last updated: 12/11/2009 back surgery  in  87 Cervical laminectomy 2003 Dr Jeral Fruit Appendectomy Cholecystectomy due Colon 2012  Social History: Last updated: 11/23/2008  Occupation: retired.  prev worked as a Industrial/product designer. Married x 4 children. Former Smoker. started at age 27.  less than 1 ppd.  quit 1973 Alcohol use-no Drug use-no  Review of Systems       The patient complains of suspicious skin lesions.  The patient denies fever, weight gain, dyspnea on exertion, abdominal pain, and difficulty walking.    Physical Exam  General:  NAD overweight-appearing.   Head:  Normocephalic and atraumatic without obvious abnormalities. No apparent alopecia or balding. Eyes:  No corneal or conjunctival inflammation noted. EOMI. Perrla.  Ears:  External ear exam shows no significant lesions or deformities.  Otoscopic examination reveals clear canals, tympanic membranes are intact bilaterally without bulging, retraction, inflammation or discharge. Hearing is grossly normal bilaterally. Nose:  WNL Mouth:  WNL Neck:  No deformities, masses, or tenderness noted. Lungs:  CTA Heart:   RRR Abdomen:  S/NTno inguinal hernia, no hepatomegaly, and no splenomegaly.   Msk:  Cervical spine is tender to palpation over paraspinal muscles and with the ROM Lumbar-sacral spine is tender to palpation over paraspinal muscles and painfull with the ROM  Pulses:  R and L carotid,radial,femoral,dorsalis pedis and posterior tibial pulses are full and equal bilaterally Neurologic:  No cranial nerve deficits noted. Station and gait are normal. Plantar reflexes are down-going bilaterally. DTRs are symmetrical throughout. Sensory, motor and coordinative functions appear intact. Skin:  Intact without suspicious lesions or rashes Aging changes are present moles on face Cervical Nodes:  No lymphadenopathy noted Psych:  Oriented X3. not suicidal, not homicidal, and depressed affect.     Impression & Recommendations:  Problem # 1:  LOW BACK PAIN (ICD-724.2) Assessment Improved  His updated medication list for this problem includes:    Ms Contin 30 Mg Xr12h-tab (Morphine sulfate) .Marland Kitchen... 1 by mouth two times a day please,  fill on or after  07/25/10          . ok to fill 1-2 day earlier when the month has 31 days in it.    Oxycodone Hcl 15 Mg Tabs (Oxycodone hcl) .Marland Kitchen... 1 by mouth up  to qid.  please,  fill on or after     07/25/10       . ok to fill 1-2 day earlier when the month has 31 days in it.    Flexeril 10 Mg Tabs (Cyclobenzaprine hcl) .Marland Kitchen... Take 1 by mouth three times a day qd    Aspir-low 81 Mg Tbec (Aspirin) .Marland Kitchen... 1 once daily pc  Problem # 2:  PAIN IN THORACIC SPINE (ICD-724.1) Assessment: Improved Better after a spinal block His updated medication list for this problem includes:    Ms Contin 30 Mg Xr12h-tab (Morphine sulfate) .Marland Kitchen... 1 by mouth two times a day please,  fill on or after  07/25/10          . ok to fill 1-2 day earlier when the month has 31 days in it.    Oxycodone Hcl 15 Mg Tabs (Oxycodone hcl) .Marland Kitchen... 1 by mouth up  to qid.  please,  fill on or after     07/25/10       . ok to  fill 1-2 day earlier when the month has 31 days in it.    Flexeril 10 Mg Tabs (Cyclobenzaprine hcl) .Marland Kitchen... Take 1 by mouth three times a day qd    Aspir-low 81 Mg Tbec (Aspirin) .Marland Kitchen... 1 once daily pc  Problem # 3:  B12 DEFICIENCY (ICD-266.2) Assessment: Comment  Only On the regimen of medicine(s) reflected in the chart    Problem # 4:  HYPOGONADISM, MALE (ICD-257.2) Assessment: Comment Only Restart Rx  Problem # 5:  HYPERTENSION (ICD-401.9) Assessment: Unchanged  His updated medication list for this problem includes:    Propranolol Hcl 80 Mg Tabs (Propranolol hcl) .Marland Kitchen... 1 by mouth tid    Demadex 100 Mg Tabs (Torsemide) .Marland Kitchen... 1 by mouth once daily prn    Benazepril Hcl 40 Mg Tabs (Benazepril hcl) .Marland Kitchen... 1 by mouth qd    Amlodipine Besylate 10 Mg Tabs (Amlodipine besylate) .Marland Kitchen... 1 by mouth once daily for blood pressure  BP today: 130/62 Prior BP: 132/68 (03/17/2010)  Labs Reviewed: K+: 4.4 (06/10/2010) Creat: : 0.7 (06/10/2010)   Chol: 128 (12/09/2009)   HDL: 20.40 (12/09/2009)   LDL: 81 (12/09/2009)   TG: 134.0 (12/09/2009)  Problem # 6:  DIABETES MELLITUS, TYPE II (ICD-250.00) Assessment: Unchanged  His updated medication list for this problem includes:    Aspir-low 81 Mg Tbec (Aspirin) .Marland Kitchen... 1 once daily pc    Benazepril Hcl 40 Mg Tabs (Benazepril hcl) .Marland Kitchen... 1 by mouth qd  Problem # 7:  OBSTRUCTIVE SLEEP APNEA (ICD-327.23) Assessment: Unchanged  Orders: Home Health Referral (Home Health) new mask  Problem # 8:  PALPITATIONS (ICD-785.1) Assessment: Improved  His updated medication list for this problem includes:    Propranolol Hcl 80 Mg Tabs (Propranolol hcl) .Marland Kitchen... 1 by mouth tid  Problem # 9:  NEOPLASM OF UNCERTAIN BEHAVIOR OF SKIN (ICD-238.2) Assessment: New Sch biopsy   Complete Medication List: 1)  Ms Contin 30 Mg Xr12h-tab (Morphine sulfate) .Marland Kitchen.. 1 by mouth two times a day please,  fill on or after  07/25/10          . ok to fill 1-2 day earlier when the month has  31 days in it. 2)  Oxycodone Hcl 15 Mg Tabs (Oxycodone hcl) .Marland Kitchen.. 1 by mouth up  to qid.  please,  fill on or after     07/25/10       . ok to fill 1-2 day earlier when the month has 31 days in it. 3)  Propranolol Hcl 80 Mg Tabs (Propranolol hcl) .Marland Kitchen.. 1 by mouth tid 4)  Lotrisone 1-0.05 % Crea (Clotrimazole-betamethasone) .... Two times a day x 1 mo prn 5)  Lanoxin 0.25 Mg Tabs (Digoxin) .Marland Kitchen.. 1 by mouth once daily 6)  Clonazepam 1 Mg Tabs (Clonazepam) .Marland Kitchen.. 1 by mouth two times a day prn 7)  Flexeril 10 Mg Tabs (Cyclobenzaprine hcl) .... Take 1 by mouth three times a day qd 8)  Demadex 100 Mg Tabs (Torsemide) .Marland Kitchen.. 1 by mouth once daily prn 9)  Vitamin D3 1000 Unit Tabs (Cholecalciferol) .Marland Kitchen.. 1 qd 10)  Aspir-low 81 Mg Tbec (Aspirin) .Marland Kitchen.. 1 once daily pc 11)  Vitamin B-12 Cr 1000 Mcg Tbcr (Cyanocobalamin) .... Take one tablet by mouth daily 12)  Wellbutrin Sr 150 Mg Xr12h-tab (Bupropion hcl) .Marland Kitchen.. 1 by mouth bid 13)  Testosterone Cypionate 200 Mg/ml Oil (Testosterone cypionate) .... 2 ml im q 2 wks 14)  Bd Integra Syringe 25g X 1" 3 Ml Misc (Syringe/needle (disp)) .... As dirr 15)  Cymbalta 60 Mg Cpep (Duloxetine hcl) .Marland Kitchen.. 1 by mouth once daily for depression 16)  Benazepril Hcl 40 Mg Tabs (Benazepril hcl) .Marland Kitchen.. 1 by mouth qd 17)  Amlodipine Besylate 10 Mg Tabs (Amlodipine besylate) .Marland Kitchen.. 1 by mouth once daily for blood pressure 18)  Vit B12 1200mg   .... Once  daily 19)  Prozac 40 Mg Caps (Fluoxetine hcl) .... Once daily 20)  Cpap Mask and Supplies  .... As dirrected dx osa  Patient Instructions: 1)  Please schedule a follow-up appointment in 2 months. 2)  BMP prior to visit, ICD-9: 3)  Hepatic Panel prior to visit, ICD-9: 4)  CBC w/ Diff prior to visit, ICD-9:257.20 5)  Testost 6)  Vit B12 266.20 7)  Skin biopsy  Prescriptions: TESTOSTERONE CYPIONATE 200 MG/ML OIL (TESTOSTERONE CYPIONATE) 2 ml IM q 2 wks  #qs x 12   Entered and Authorized by:   Tresa Garter MD   Signed by:   Tresa Garter MD on 06/16/2010   Method used:   Print then Give to Patient   RxID:   [id number] LOTRISONE 1-0.05 % CREA (CLOTRIMAZOLE-BETAMETHASONE) two times a day x 1 mo prn  #45g x 3   Entered and Authorized by:   Tresa Garter MD   Signed by:   Tresa Garter MD on 06/16/2010   Method used:   Print then Give to Patient   RxID:   [id number] OXYCODONE HCL 15 MG TABS (OXYCODONE HCL) 1 by mouth up  to qid.  Please,  fill on or after     07/25/10       . OK to fill 1-2 day earlier when the month has 31 days in it.  #90 x 0   Entered and Authorized by:   Tresa Garter MD   Signed by:   Tresa Garter MD on 06/16/2010   Method used:   Print then Give to Patient   RxID:   [id number] MS CONTIN 30 MG XR12H-TAB (MORPHINE SULFATE) 1 by mouth two times a day Please,  fill on or after  07/25/10          . OK to fill 1-2 day earlier when the month has 31 days in it.  #600 x 0   Entered and Authorized by:   Tresa Garter MD   Signed by:   Tresa Garter MD on 06/16/2010   Method used:   Print then Give to Patient   RxID:   [id number] OXYCODONE HCL 15 MG TABS (OXYCODONE HCL) 1 by mouth up  to qid.  Please,  fill on or after     06/24/10       . OK to fill 1-2 day earlier when the month has 31 days in it.  #90 x 0   Entered and Authorized by:   Tresa Garter MD   Signed by:   Tresa Garter MD on 06/16/2010   Method used:   Print then Give to Patient   RxID:   [id number] MS CONTIN 30 MG XR12H-TAB (MORPHINE SULFATE) 1 by mouth two times a day Please,  fill on or after  06/24/10          . OK to fill 1-2 day earlier when the month has 31 days in it.  #60 x 0   Entered and Authorized by:   Tresa Garter MD   Signed by:   Tresa Garter MD on 06/16/2010   Method used:   Print then Give to Patient   RxID:   [id number] CPAP  MASK AND SUPPLIES as dirrected Dx OSA  #1 x 0   Entered and Authorized by:   Tresa Garter MD    Signed by:   Tresa Garter MD on 06/16/2010   Method used:  Print then Give to Patient   RxID:   [id number]    Orders Added: 1)  Home Health Referral [facility]] 2)  Est. Patient Level V [25427]

## 2010-07-09 ENCOUNTER — Other Ambulatory Visit: Payer: Self-pay | Admitting: Internal Medicine

## 2010-07-09 MED ORDER — AMLODIPINE BESYLATE 10 MG PO TABS: 10.0000 mg | ORAL_TABLET | Freq: Every day | ORAL | Status: DC

## 2010-07-17 ENCOUNTER — Ambulatory Visit (INDEPENDENT_AMBULATORY_CARE_PROVIDER_SITE_OTHER): Payer: Medicare Other | Admitting: Internal Medicine

## 2010-07-17 ENCOUNTER — Encounter: Payer: Self-pay | Admitting: Internal Medicine

## 2010-07-17 VITALS — BP 130/70 | HR 68 | Temp 98.8°F | Resp 16 | Wt 216.0 lb

## 2010-07-17 DIAGNOSIS — D489 Neoplasm of uncertain behavior, unspecified: Secondary | ICD-10-CM

## 2010-07-17 DIAGNOSIS — D485 Neoplasm of uncertain behavior of skin: Secondary | ICD-10-CM

## 2010-07-17 NOTE — Progress Notes (Signed)
  Subjective:    Patient ID: Vernon Bishop, male    DOB: 1940-09-01, 70 y.o.   MRN: [id number]  HPI  He is here for kin biopsy  Review of Systems     Objective:   Physical Exam        Assessment & Plan:  NEOPLASM OF UNCERTAIN BEHAVIOR OF SKIN  Procedure Note :     Procedure :  Skin biopsy   Indication:  Changing mole (s ),  Suspicious lesion(s)   Risks including unsuccessful procedure , bleeding, infection, bruising, scar, a need for another complete procedure and others were explained to the patient in detail as well as the benefits. Informed consent was obtained and signed.   The patient was placed in a decubitus position.  Lesion #1 on   R cheek  measuring  13x9   mm   Skin over lesion #1  was prepped with Betadine and alcohol  and anesthetized with 1 cc of 2% lidocaine and epinephrine, using a 25-gauge 1 inch needle.  Shave biopsy of a lesion fragment with a sterile Dermablade was carried out in the usual fashion. Hyfrecator was used to destroy the rest of the lesion  left behind and for hemostasis. Band-Aid was applied with antibiotic ointment.    Lesion #2 on  L cheek medial from #1   measuring  6x4 mm   Skin over lesion #2  was prepped with Betadine and alcohol  and anesthetized with 1/2 cc of 2% lidocaine and epinephrine, using a 25-gauge 1 inch needle.  Shave biopsy with a sterile Dermablade was carried out in the usual fashion. It was attempted to biopsy with  1 mm free margins.  Hyfrecator was used to destroy the rest of the lesion potentially left behind and for hemostasis. Band-Aid was applied with antibiotic ointment.  Lesion #3 on L cheek   measuring 23x11  mm   Skin over lesion #3  was prepped with Betadine and alcohol  and anesthetized with 1.5 cc of 2% lidocaine and epinephrine, using a 25-gauge 1 inch needle.  Shave biopsy of the lesion fragment with a sterile Dermablade was carried out in the usual fashion.  Hyfrecator was used to destroy the rest of the  lesion  left behind and for hemostasis. Band-Aid was applied with antibiotic ointment.   Tolerated well. Complications none.      Postprocedure instructions :    A Band-Aid should be  changed twice daily. You can take a shower tomorrow.  Keep the wounds clean. You can wash them with liquid soap and water. Pat dry with gauze or a Kleenex tissue  Before applying antibiotic ointment and a Band-Aid.   You need to report immediately  if fever, chills or any signs of infection develop.    The biopsy results should be available in 1 -2 weeks.

## 2010-07-17 NOTE — Patient Instructions (Signed)
Postprocedure instructions :    A Band-Aid should be  changed twice daily. You can take a shower tomorrow.  Keep the wounds clean. You can wash them with liquid soap and water. Pat dry with gauze or a Kleenex tissue  Before applying antibiotic ointment and a Band-Aid.   You need to report immediately  if fever, chills or any signs of infection develop.    The biopsy results should be available in 1 -2 weeks. 

## 2010-07-18 ENCOUNTER — Telehealth: Payer: Self-pay | Admitting: Internal Medicine

## 2010-07-18 NOTE — Telephone Encounter (Signed)
Vernon Bishop , please, inform the patient: skin bx was OK  Thank you !

## 2010-07-21 NOTE — Telephone Encounter (Signed)
No answer. Will retry later.  

## 2010-07-21 NOTE — Assessment & Plan Note (Signed)
Procedure Note :     Procedure :  Skin biopsy   Indication:  Changing mole (s ),  Suspicious lesion(s)   Risks including unsuccessful procedure , bleeding, infection, bruising, scar, a need for another complete procedure and others were explained to the patient in detail as well as the benefits. Informed consent was obtained and signed.   The patient was placed in a decubitus position.  Lesion #1 on   R cheek  measuring  13x9   mm   Skin over lesion #1  was prepped with Betadine and alcohol  and anesthetized with 1 cc of 2% lidocaine and epinephrine, using a 25-gauge 1 inch needle.  Shave biopsy of a lesion fragment with a sterile Dermablade was carried out in the usual fashion. Hyfrecator was used to destroy the rest of the lesion  left behind and for hemostasis. Band-Aid was applied with antibiotic ointment.    Lesion #2 on  L cheek medial from #1   measuring  6x4 mm   Skin over lesion #2  was prepped with Betadine and alcohol  and anesthetized with 1/2 cc of 2% lidocaine and epinephrine, using a 25-gauge 1 inch needle.  Shave biopsy with a sterile Dermablade was carried out in the usual fashion. It was attempted to biopsy with  1 mm free margins.  Hyfrecator was used to destroy the rest of the lesion potentially left behind and for hemostasis. Band-Aid was applied with antibiotic ointment.  Lesion #3 on L cheek   measuring 23x11  mm   Skin over lesion #3  was prepped with Betadine and alcohol  and anesthetized with 1.5 cc of 2% lidocaine and epinephrine, using a 25-gauge 1 inch needle.  Shave biopsy of the lesion fragment with a sterile Dermablade was carried out in the usual fashion.  Hyfrecator was used to destroy the rest of the lesion  left behind and for hemostasis. Band-Aid was applied with antibiotic ointment.   Tolerated well. Complications none.      Postprocedure instructions :    A Band-Aid should be  changed twice daily. You can take a shower tomorrow.  Keep the wounds  clean. You can wash them with liquid soap and water. Pat dry with gauze or a Kleenex tissue  Before applying antibiotic ointment and a Band-Aid.   You need to report immediately  if fever, chills or any signs of infection develop.    The biopsy results should be available in 1 -2 weeks.

## 2010-07-23 NOTE — Telephone Encounter (Signed)
Pt not a home... Will retry later

## 2010-07-29 NOTE — Telephone Encounter (Signed)
No answer, no VM

## 2010-08-07 NOTE — Telephone Encounter (Signed)
Multiple unsuccessful attempts to contact pt. Will mail result letter.

## 2010-08-11 ENCOUNTER — Other Ambulatory Visit: Payer: Self-pay | Admitting: Internal Medicine

## 2010-08-11 DIAGNOSIS — E538 Deficiency of other specified B group vitamins: Secondary | ICD-10-CM

## 2010-08-11 DIAGNOSIS — E291 Testicular hypofunction: Secondary | ICD-10-CM

## 2010-08-12 ENCOUNTER — Other Ambulatory Visit: Payer: Self-pay | Admitting: Internal Medicine

## 2010-08-13 ENCOUNTER — Telehealth: Payer: Self-pay | Admitting: *Deleted

## 2010-08-13 ENCOUNTER — Other Ambulatory Visit (INDEPENDENT_AMBULATORY_CARE_PROVIDER_SITE_OTHER): Payer: Medicare Other

## 2010-08-13 ENCOUNTER — Other Ambulatory Visit: Payer: Self-pay | Admitting: *Deleted

## 2010-08-13 DIAGNOSIS — E538 Deficiency of other specified B group vitamins: Secondary | ICD-10-CM

## 2010-08-13 DIAGNOSIS — E291 Testicular hypofunction: Secondary | ICD-10-CM

## 2010-08-13 LAB — CBC WITH DIFFERENTIAL/PLATELET
Basophils Relative: 1.2 % (ref 0.0–3.0)
Eosinophils Relative: 1.4 % (ref 0.0–5.0)
HCT: 42.1 % (ref 39.0–52.0)
MCV: 90.8 fl (ref 78.0–100.0)
Monocytes Absolute: 0.8 10*3/uL (ref 0.1–1.0)
Neutrophils Relative %: 57.7 % (ref 43.0–77.0)
RBC: 4.64 Mil/uL (ref 4.22–5.81)
WBC: 9.1 10*3/uL (ref 4.5–10.5)

## 2010-08-13 LAB — BASIC METABOLIC PANEL
BUN: 10 mg/dL (ref 6–23)
Creatinine, Ser: 0.7 mg/dL (ref 0.4–1.5)
GFR: 124.75 mL/min (ref >60.00)

## 2010-08-13 LAB — VITAMIN B12: Vitamin B-12: 370 pg/mL (ref 211–911)

## 2010-08-13 LAB — HEPATIC FUNCTION PANEL
ALT: 18 U/L (ref 0–53)
Total Bilirubin: 0.6 mg/dL (ref 0.3–1.2)

## 2010-08-13 NOTE — Telephone Encounter (Signed)
rec rf req for Digoxin 250 mcg. 1 1/2 qd # 135........((Our chart says 1 qd.))     Last filled 04-07-10. Ok to Rf sig of 1 1/2 qd?

## 2010-08-13 NOTE — Telephone Encounter (Signed)
OK to fill this prescription with additional refills x3 Thank you!  

## 2010-08-14 ENCOUNTER — Encounter: Payer: Self-pay | Admitting: Internal Medicine

## 2010-08-14 MED ORDER — DIGOXIN 250 MCG PO TABS: 375.0000 ug | ORAL_TABLET | Freq: Every day | ORAL | Status: DC

## 2010-08-15 NOTE — Discharge Summary (Signed)
   NAME:  CAM, DAUPHIN NO.:  [id number]   MEDICAL RECORD NO.:  [id number]                   PATIENT TYPE:  INP   LOCATION:  3006                                 FACILITY:  [facility]   PHYSICIAN:  Stefani Dama, M.D.               DATE OF BIRTH:  03/27/1941   DATE OF ADMISSION:  06/28/2002  DATE OF DISCHARGE:  07/01/2002                                 DISCHARGE SUMMARY   ADMITTING DIAGNOSES:  Cervical spondylosis with stenosis C3-4, C4-5, C5-6,  and C6-7.   DISCHARGE AND FINAL DIAGNOSES:  1. Cervical spondylosis with stenosis C3-4, C4-5, C5-6, and C6-7.  2. Right upper extremity weakness.   CONDITION ON DISCHARGE:  Stable.   HOSPITAL COURSE:  The patient is a 70 year old individual who has had  significant neck, shoulder, and arm pain with some suggestion of some  weakness.  He was found to have severe spondylitic stenosis at C3-4, 4-5, 5-  6, and 6-7.  On June 28, 2002 the patient was taken to the operating room  where he underwent anterior diskectomy and arthrodesis at each of these  levels.  Postoperatively the patient experienced some weakness in the region  of the deltoid and the biceps on the right side with the deltoid being  graded to 5 and the biceps at 3/5.  Over the past two days it has improved.  The patient was treated with some physical therapy and observation.  His  incision is clean and dry at the current time.  He also had a postoperative  drain which remained in place for the first 36 hours postoperatively.  At  the current time he is discharged home with a prescription for Percocet  number 40 without refills, Valium 5 mg number 20 without refills.  He will  be seen in the office in three weeks for further follow-up.  He has been  advised to do some exercises for the shoulder to maintain mobility.                                               Stefani Dama, M.D.    Merla Riches  D:  07/01/2002  T:  07/03/2002  Job:  [id number]

## 2010-08-15 NOTE — Discharge Summary (Signed)
NAME:  Vernon Bishop, Vernon Bishop NO.:  [id number]   MEDICAL RECORD NO.:  [id number]          PATIENT TYPE:  INP   LOCATION:  3729                         FACILITY:  [facility]   PHYSICIAN:  Charlton Haws, M.D.     DATE OF BIRTH:  1940-10-06   DATE OF ADMISSION:  01/23/2004  DATE OF DISCHARGE:  01/24/2004                                 DISCHARGE SUMMARY   PROCEDURES:  1.  Cardiac catheterization.  2.  Coronary arteriogram.  3.  Left ventriculogram.  4.  CT of the chest as scheduled.   HOSPITAL COURSE:  [name] is a 70 year old male with no known history of  coronary artery disease.  He was in his usual state of health on the day of  admission and as he was walking to his car he developed sudden onset of  extreme shortness of breath.  He was driving to work but the symptoms became  worse and were associated with paraesthesias in both hands.  He stopped at  the local fire station and was transported by EMS to the emergency room.  He  stated he had immediate improvement in his symptoms with the administration  of oxygen.  He had PND on two previous nights, but otherwise denies any  recent respiratory problems.  He was admitted for further evaluation and  treatment.   [name] had had a stress test as part of a routine evaluation about a  year ago.  It was electrocardiographically positive and therefore Dr. Eden Emms  felt that combining that with his cardiac risk factors indicated a  catheterization and this was performed on January 23, 2004.   The catheterization showed normal coronary arteries and normal LV.  His EF  was 60% and there was no aortic dissection.  He tolerated the procedure  well.  His D-dimer was borderline elevated so Dr. Eden Emms felt a CT was  indicated.   Post catheterization [name] was without chest pain or shortness of  breath.  His groin was stable.  Laboratories are pending but if his BMET is  within normal limits he will get a chest CT in the  morning and if that is  negative for PE he will be considered stable for discharge on January 24, 2004.   DISCHARGE DIAGNOSES:  1.  Chest pain.  No significant coronary artery disease.  CT pending to rule      out pulmonary embolism.  2.  Hypertension.  3.  Hyperlipidemia.  4.  Family history of coronary artery disease.  5.  History of depression.  6.  History of obstructive sleep apnea on CPAP.  7.  History of mitral valve prolapse.  8.  Degenerative joint disease.  9.  History of paroxysmal atrial fibrillation.  10. History of C-spine surgery, lumbar surgery, appendectomy, and      cholecystectomy.   ALLERGIES:  Allergy/intolerance to [facility].   DISCHARGE INSTRUCTIONS:  His activity level is to include no strenuous  activity for two days.  He is to stick to a low fat diet.  He is to call the  office with  problems with the catheterization site.  He is to call Dr.  Posey Rea for an appointment and to see the P.A. for Dr. Eden Emms on  Wednesday, November 9 at 11:30.   DISCHARGE MEDICATIONS:  1.  Benicar/HCT 40/25 mg daily.  2.  Wellbutrin SR 150 mg b.i.d.  3.  Lipitor 20 mg daily.  4.  Digitek 250 mcg one and a half tablets daily.  5.  AndroGel daily.  6.  Lotrel 5/20 daily.       RB/MEDQ  D:  01/23/2004  T:  01/23/2004  Job:  [id number]   cc:   Georgina Quint. [name], M.D. [facility]

## 2010-08-15 NOTE — Op Note (Signed)
NAME:  LLOYDE, LUDLAM NO.:  [id number]   MEDICAL RECORD NO.:  [id number]                   PATIENT TYPE:  INP   LOCATION:  3006                                 FACILITY:  [facility]   PHYSICIAN:  Hilda Lias, M.D.                DATE OF BIRTH:  09/16/1940   DATE OF PROCEDURE:  06/28/2002  DATE OF DISCHARGE:                                 OPERATIVE REPORT   PREOPERATIVE DIAGNOSIS:  Cervical stenosis, C3-4 to C6-7, with calcification  of the anterior ligament.   POSTOPERATIVE DIAGNOSIS:  Cervical stenosis, C3-4 to C6-7, with  calcification of the anterior ligament.   PROCEDURES:  Anterior removal of osteophyte at C3-4 down to C6-7, total  gross diskectomy, decompression of the spinal cord, bilateral foraminotomy,  bone graft iliac crest, plate from C3 to C7.  Microscope.   SURGEON:  Hilda Lias, M.D.   ASSISTANT:  Stefani Dama, M.D.   CLINICAL HISTORY:  The patient is being admitted because of neck pain with  radiation to both upper extremities.  I have followed this gentleman for  several months, and he is getting worse.  MRI showed  stenosis from C3-4  down to C6-7 with the ossification of the anterior ligament.  Surgery was  advised.  The risks were explained in the history and physical.   DESCRIPTION OF PROCEDURE:  The patient was taken to the OR.  After  intubation, the neck was prepped with Betadine.  A longitudinal incision was  made in the left side in the skin, platysma, all the way down to the  cervical spine.  We found that indeed there were large osteophytes at the  level of 3-4, 4-5, 5-6, 6-7.  We started drilling to remove the osteophyte  and to localize the annulus of the disk.  We started at the level of 5-6,  which was the worst.  The patient had almost no disk space, mostly was  degenerative disk space.  The area was drilled with good decompression of  the spinal cord as well as the foramen.  The same procedure was done at  C6-  7, followed by 4-5 and 3-4.  At all those levels we had really awesome  pathology, at 3-4 was mostly central and to the left, 4-5 was bilaterally,  and so was the level of the 5-6 and 6-7.  After we completed drilling and  had decompression, we used up four pieces of iliac crest of 7 mm height and  inserted between C3 down to C6-7.  Then using a plate with going all the way  up from C3 down to C7.  We did a lateral cervical spine, and it was  difficult to see only up to the level of C4.  The patient has really big  shoulders and a short neck.  Nevertheless, with the nerve hook we have good  space between the posterior aspect  of the bone graft and the posterior  bodies.  From then on a total of 10 screws was used to secure the plate in  place.  The area was irrigated.  There was no copious amount of bleeding,  but she had a small oozing coming mostly from the __________ .  Because of  that, we decided to use a Jackson-Pratt in the paraspinal area.  From then  on the area was irrigated.  Investigation of the esophagus and trachea and  carotid was negative.  The wound was closed with Vicryl and a Steri-Strip.                                               Hilda Lias, M.D.    EB/MEDQ  D:  06/28/2002  T:  06/29/2002  Job:  [id number]

## 2010-08-15 NOTE — Cardiovascular Report (Signed)
NAME:  Vernon Bishop, Vernon Bishop NO.:  [id number]   MEDICAL RECORD NO.:  [id number]          PATIENT TYPE:  INP   LOCATION:  1824                         FACILITY:  [facility]   PHYSICIAN:  Charlton Haws, M.D.     DATE OF BIRTH:  [birth year]   DATE OF PROCEDURE:  DATE OF DISCHARGE:                              CARDIAC CATHETERIZATION   INDICATIONS FOR PROCEDURE:  The patient was admitted to the ER with  worrisome episode of rapid palpitations, dyspnea, presyncope and chest pain.   He has history of paroxysmal atrial fibrillation.   He had a Cardiolite study suggesting the possibility of posterior wall  infarct previously.   He had an abnormal treadmill test during this Cardiolite but was on digoxin  for his paroxysmal atrial fibrillation.  Same catheterization was done from  the right femoral artery.  We had a little bit of hang up of our wire going  up the aorta  and the JL4 catheter appeared a little small and three  appeared to be some aortic root dilatation given his history and these  difficulties with our wire, we decided to shoot an aortic root at the end of  the case.   Left main coronary artery was normal.   Left anterior descending artery was normal.   First and second diagonal branches were normal.   Circumflex coronary artery was nondominant and was normal.   Right coronary artery was dominant and normal.   RAO ventriculography:  RAO ventriculography was normal.  EF was 65%.  There  was no gradient across the aortic valve and no MR.  Aortic pressure was  106/62.  LV pressure was 120/10.   LAO aortography showed no evidence of dissection and no significant aortic  insufficiency.   IMPRESSION:  The patient's episode would appear not to be from decreased  left ventricular function, aortic disease or coronary artery disease.  We  will pull his sheaths.  He tolerated the procedure well.  We will monitor  him for recurrent paroxysmal atrial fibrillation.  He  will have a CT scan of  the chest in the morning.  There did appear to be some calcification in the  mediastinal area on fluoroscopy and we will also be able to rule out  pulmonary emboli.      PN/MEDQ  D:  01/23/2004  T:  01/23/2004  Job:  [id number]

## 2010-08-15 NOTE — H&P (Signed)
NAME:  Vernon Bishop, Vernon Bishop NO.:  [id number]   MEDICAL RECORD NO.:  [id number]                   PATIENT TYPE:  INP   LOCATION:  3172                                 FACILITY:  [facility]   PHYSICIAN:  Hilda Lias, M.D.                DATE OF BIRTH:  1941-01-28   DATE OF ADMISSION:  06/28/2002  DATE OF DISCHARGE:                                HISTORY & PHYSICAL   HISTORY OF PRESENT ILLNESS:  The patient is a gentleman who was seen by me a  year ago because of neck pain with radiation into both upper extremities  associated with weakness and difficulty sleeping. The patient had been able  to get around without problems, but he came back to my office complaining of  increase of the pain, feeling no better, feels that he had weakness in both  arms and numbness, and he had difficulty getting around and also no sleeping  at nighttime.  He had an MRI of the spine and he wanted to proceed with  surgery.  I found that he has pathology going all the way down from C3 down  to C7.  Previously, I did surgery on his lower back.   PAST MEDICAL HISTORY:  Appendectomy, cholecystectomy, lumbar surgery in  1980, knee surgery.   ALLERGIES:  ZOMAX.   SOCIAL HISTORY:  The patient does not smoke. He drinks socially.  He is 6  feet tall and 250 pounds.   FAMILY HISTORY:  Mother is 75 and in good condition.   REVIEW OF SYSTEMS:  Positive for ringing of the ears, most likely related to  high blood pressure and weakness with regular pulses.   PHYSICAL EXAMINATION:  HEENT: Normal.  NECK:  He is able to flex, but extension is quite painful.  LUNGS:  Clear.  HEART:  Normal.  ABDOMEN:  Normal.  EXTREMITIES:  Normal pulses.  NEUROLOGY:  Mental status normal.  Cranial nerves normal.  Strength; he has  weakness of both biceps and triceps and also thenar muscle.  He has a  decrease in both biceps reflexes. Coordination normal.  Gait normal.  In the  lumbar spine, he has scar from  previous surgery.  The MRI noted that indeed  he has pathology all the way down from C3 to C6-7.  Herniated disk and  spondylosis with flopping of the spinal cord.  We compared the x-ray from  this time and the previous one and indeed his pathology is getting worse.   IMPRESSION:  Cervical stenosis from C3 down to C7.   RECOMMENDATIONS:  The patient will be admitted for a four-level surgical  diskectomy. He knows all of the risks such as infection, CSF leak, worsening  of the pain, paralysis, need for further surgery, failure of the bone graft.  He declined more opinion.  Hilda Lias, M.D.   EB/MEDQ  D:  06/28/2002  T:  06/28/2002  Job:  [id number]

## 2010-08-15 NOTE — Letter (Signed)
December 24, 2005     [employer]  [address]  [place], [place] [address]   RE:  DUSTON, SMOLENSKI  MRN:  [id number]  /  DOB:  04/16/1940   Dear Sharee Pimple:   Please excuse my patient, Vernon Bishop, [address] [place],  [place], [id number], from his jury duty on January 12, 2006, juror number  [phone], due to his chronic medical problems including severe lower back pain.   Sincerely,      Georgina Quint. [name], MD     AVP/MedQ  DD:  12/24/2005  DT:  12/26/2005  Job #:  [id number]

## 2010-08-18 ENCOUNTER — Ambulatory Visit (INDEPENDENT_AMBULATORY_CARE_PROVIDER_SITE_OTHER): Payer: Medicare Other | Admitting: Internal Medicine

## 2010-08-18 ENCOUNTER — Encounter: Payer: Self-pay | Admitting: Internal Medicine

## 2010-08-18 DIAGNOSIS — E291 Testicular hypofunction: Secondary | ICD-10-CM

## 2010-08-18 DIAGNOSIS — G4733 Obstructive sleep apnea (adult) (pediatric): Secondary | ICD-10-CM

## 2010-08-18 DIAGNOSIS — E538 Deficiency of other specified B group vitamins: Secondary | ICD-10-CM

## 2010-08-18 DIAGNOSIS — E119 Type 2 diabetes mellitus without complications: Secondary | ICD-10-CM

## 2010-08-18 DIAGNOSIS — M545 Low back pain, unspecified: Secondary | ICD-10-CM

## 2010-08-18 DIAGNOSIS — R002 Palpitations: Secondary | ICD-10-CM

## 2010-08-18 MED ORDER — MORPHINE SULFATE CR 30 MG PO TB12: 30.0000 mg | ORAL_TABLET | Freq: Two times a day (BID) | ORAL | Status: DC

## 2010-08-18 MED ORDER — OXYCODONE HCL 15 MG PO TABS: 15.0000 mg | ORAL_TABLET | Freq: Four times a day (QID) | ORAL | Status: DC | PRN

## 2010-08-18 NOTE — Assessment & Plan Note (Signed)
On CPAP at night 

## 2010-08-18 NOTE — Assessment & Plan Note (Signed)
Worse lately. On Rx.

## 2010-08-18 NOTE — Assessment & Plan Note (Signed)
Testost elev 8 d following the shot  --- no change in dose

## 2010-08-18 NOTE — Progress Notes (Signed)
  Subjective:    Patient ID: Vernon Bishop, male    DOB: February 16, 1941, 70 y.o.   MRN: [id number]  HPI    Review of Systems  Constitutional: Negative for chills and fatigue.  HENT: Negative for rhinorrhea.   Eyes: Negative for redness.  Respiratory: Negative for chest tightness.   Gastrointestinal: Negative for abdominal distention.  Genitourinary: Positive for testicular pain (Left). Negative for scrotal swelling.  Skin: Negative for rash.  Neurological: Negative for headaches.  Psychiatric/Behavioral: Negative for dysphoric mood.       Objective:   Physical Exam  Constitutional: He is oriented to person, place, and time. He appears well-developed.       obese  HENT:  Mouth/Throat: Oropharynx is clear and moist.  Eyes: Conjunctivae are normal. Pupils are equal, round, and reactive to light.  Neck: Normal range of motion. No JVD present. No thyromegaly present.  Cardiovascular: Normal rate, regular rhythm, normal heart sounds and intact distal pulses.  Exam reveals no gallop and no friction rub.   No murmur heard. Pulmonary/Chest: Effort normal and breath sounds normal. No respiratory distress. He has no wheezes. He has no rales. He exhibits no tenderness.  Abdominal: Soft. Bowel sounds are normal. He exhibits no distension and no mass. There is no tenderness. There is no rebound and no guarding.  Musculoskeletal: Normal range of motion. He exhibits tenderness (LS is sore). He exhibits no edema.  Lymphadenopathy:    He has no cervical adenopathy.  Neurological: He is alert and oriented to person, place, and time. He has normal reflexes. No cranial nerve deficit. He exhibits normal muscle tone. Coordination normal.  Skin: Skin is warm and dry. No rash noted.  Psychiatric: He has a normal mood and affect. His behavior is normal. Judgment and thought content normal.          Assessment & Plan:  LOW BACK PAIN Worse lately. On Rx.  PALPITATIONS On rx  DIABETES MELLITUS,  TYPE II On Rx  OBSTRUCTIVE SLEEP APNEA On CPAP at night  B12 DEFICIENCY On Rx  HYPOGONADISM, MALE Testost elev 8 d following the shot  --- no change in dose

## 2010-08-18 NOTE — Assessment & Plan Note (Signed)
On Rx 

## 2010-08-18 NOTE — Assessment & Plan Note (Signed)
On rx 

## 2010-09-22 ENCOUNTER — Other Ambulatory Visit: Payer: Self-pay | Admitting: Internal Medicine

## 2010-09-22 NOTE — Telephone Encounter (Signed)
Ok to Rf? 

## 2010-09-26 NOTE — Telephone Encounter (Signed)
OK to fill this prescription with additional refills x3 Thank you!  

## 2010-10-17 ENCOUNTER — Telehealth: Payer: Self-pay | Admitting: *Deleted

## 2010-10-17 NOTE — Telephone Encounter (Signed)
Rx request for: Oxycodone & Morphine

## 2010-10-18 NOTE — Telephone Encounter (Signed)
OK to fill both prescriptions with additional refills x0 Thank you!  

## 2010-10-20 ENCOUNTER — Telehealth: Payer: Self-pay | Admitting: *Deleted

## 2010-10-20 ENCOUNTER — Other Ambulatory Visit: Payer: Self-pay | Admitting: *Deleted

## 2010-10-20 MED ORDER — OXYCODONE HCL 15 MG PO TABS: 15.0000 mg | ORAL_TABLET | Freq: Four times a day (QID) | ORAL | Status: DC | PRN

## 2010-10-20 MED ORDER — MORPHINE SULFATE CR 30 MG PO TB12: 30.0000 mg | ORAL_TABLET | Freq: Two times a day (BID) | ORAL | Status: DC

## 2010-10-20 NOTE — Telephone Encounter (Signed)
Rxs printed/signed. Pt's son informed ready to p/u.

## 2010-10-20 NOTE — Telephone Encounter (Signed)
Pt is requesting refill on morphine 30 mg and oxycodone. Please Advise refills

## 2010-10-20 NOTE — Telephone Encounter (Signed)
Can you please print these prescriptions [w/availability date changed]

## 2010-10-30 ENCOUNTER — Telehealth: Payer: Self-pay | Admitting: *Deleted

## 2010-10-30 NOTE — Telephone Encounter (Signed)
RF req for clonazepam 1 mg. OK?

## 2010-10-31 MED ORDER — CLONAZEPAM 1 MG PO TABS: 1.0000 mg | ORAL_TABLET | Freq: Two times a day (BID) | ORAL | Status: DC | PRN

## 2010-10-31 NOTE — Telephone Encounter (Signed)
k x 5 

## 2010-11-03 ENCOUNTER — Ambulatory Visit: Payer: Medicare Other | Admitting: Internal Medicine

## 2010-11-18 ENCOUNTER — Telehealth: Payer: Self-pay | Admitting: *Deleted

## 2010-11-18 MED ORDER — MORPHINE SULFATE CR 30 MG PO TB12: 30.0000 mg | ORAL_TABLET | Freq: Two times a day (BID) | ORAL | Status: DC

## 2010-11-18 MED ORDER — OXYCODONE HCL 15 MG PO TABS: 15.0000 mg | ORAL_TABLET | Freq: Four times a day (QID) | ORAL | Status: DC | PRN

## 2010-11-18 NOTE — Telephone Encounter (Signed)
Pt left another VM req refill of pain meds. Says he has been working nights and is out of meds today (not due until the 25 th) OK for RFs today?

## 2010-11-18 NOTE — Telephone Encounter (Signed)
Thx

## 2010-11-18 NOTE — Telephone Encounter (Signed)
OK to RF today per MD. Rx's completed and given to patient.

## 2010-11-18 NOTE — Telephone Encounter (Signed)
Pt is requesting refill on oxycodone and morphine, Please Advise

## 2010-11-26 ENCOUNTER — Telehealth: Payer: Self-pay | Admitting: *Deleted

## 2010-11-26 ENCOUNTER — Ambulatory Visit (INDEPENDENT_AMBULATORY_CARE_PROVIDER_SITE_OTHER): Payer: Medicare Other | Admitting: Internal Medicine

## 2010-11-26 ENCOUNTER — Encounter: Payer: Self-pay | Admitting: Internal Medicine

## 2010-11-26 DIAGNOSIS — E538 Deficiency of other specified B group vitamins: Secondary | ICD-10-CM

## 2010-11-26 DIAGNOSIS — E119 Type 2 diabetes mellitus without complications: Secondary | ICD-10-CM

## 2010-11-26 DIAGNOSIS — M545 Low back pain, unspecified: Secondary | ICD-10-CM

## 2010-11-26 DIAGNOSIS — E291 Testicular hypofunction: Secondary | ICD-10-CM

## 2010-11-26 DIAGNOSIS — I10 Essential (primary) hypertension: Secondary | ICD-10-CM

## 2010-11-26 MED ORDER — MORPHINE SULFATE CR 30 MG PO TB12: 30.0000 mg | ORAL_TABLET | Freq: Two times a day (BID) | ORAL | Status: DC

## 2010-11-26 MED ORDER — OXYCODONE HCL 15 MG PO TABS: 15.0000 mg | ORAL_TABLET | Freq: Four times a day (QID) | ORAL | Status: DC | PRN

## 2010-11-26 NOTE — Progress Notes (Signed)
  Subjective:    Patient ID: Vernon Bishop, male    DOB: 1940/08/22, 70 y.o.   MRN: [id number]  HPI    The patient is here to follow up on chronic depression, anxiety, headaches and chronic LBP symptoms controlled with medicines, diet and exercise. LBP is not good. SBP 130 at home  Review of Systems  Constitutional: Negative for appetite change, fatigue and unexpected weight change.  HENT: Negative for nosebleeds, congestion, sore throat, sneezing, trouble swallowing and neck pain.   Eyes: Negative for itching and visual disturbance.  Respiratory: Negative for cough.   Cardiovascular: Negative for chest pain, palpitations and leg swelling.  Gastrointestinal: Negative for nausea, diarrhea, blood in stool and abdominal distention.  Genitourinary: Negative for frequency and hematuria.  Musculoskeletal: Positive for back pain and arthralgias. Negative for joint swelling and gait problem.  Skin: Negative for rash.  Neurological: Negative for dizziness, tremors, speech difficulty and weakness.  Psychiatric/Behavioral: Negative for sleep disturbance, dysphoric mood and agitation. The patient is not nervous/anxious.        Objective:   Physical Exam  Constitutional: He is oriented to person, place, and time. He appears well-developed.       Obese  HENT:  Mouth/Throat: Oropharynx is clear and moist.  Eyes: Conjunctivae are normal. Pupils are equal, round, and reactive to light.  Neck: Normal range of motion. No JVD present. No thyromegaly present.  Cardiovascular: Normal rate, regular rhythm, normal heart sounds and intact distal pulses.  Exam reveals no gallop and no friction rub.   No murmur heard. Pulmonary/Chest: Effort normal and breath sounds normal. No respiratory distress. He has no wheezes. He has no rales. He exhibits no tenderness.  Abdominal: Soft. Bowel sounds are normal. He exhibits no distension and no mass. There is no tenderness. There is no rebound and no guarding.    Musculoskeletal: Normal range of motion. He exhibits tenderness (LS is tender). He exhibits no edema.  Lymphadenopathy:    He has no cervical adenopathy.  Neurological: He is alert and oriented to person, place, and time. He has normal reflexes. No cranial nerve deficit. He exhibits normal muscle tone. Coordination normal.  Skin: Skin is warm and dry. No rash noted.  Psychiatric: He has a normal mood and affect. His behavior is normal. Judgment and thought content normal.          Assessment & Plan:

## 2010-11-26 NOTE — Assessment & Plan Note (Signed)
On RX 

## 2010-11-26 NOTE — Assessment & Plan Note (Signed)
On Rx 

## 2010-11-26 NOTE — Assessment & Plan Note (Signed)
Worse lately On RX

## 2010-11-26 NOTE — Telephone Encounter (Signed)
FYI - [employer] called and left VM pt's testosterone was approved x 1 year 11/24/10 to 11/24/11.

## 2010-11-26 NOTE — Assessment & Plan Note (Signed)
  On diet  

## 2010-11-26 NOTE — Assessment & Plan Note (Addendum)
Worse lately. See Meds. SBP 130's at home. Will watch BP Readings from Last 3 Encounters:  11/26/10 180/98  08/18/10 140/80  07/17/10 130/70

## 2010-11-27 NOTE — Telephone Encounter (Signed)
Pt informed

## 2010-11-27 NOTE — Telephone Encounter (Signed)
Pharmacy informed.

## 2010-12-25 ENCOUNTER — Other Ambulatory Visit: Payer: Self-pay | Admitting: Internal Medicine

## 2011-01-15 ENCOUNTER — Telehealth: Payer: Self-pay | Admitting: *Deleted

## 2011-01-15 MED ORDER — MORPHINE SULFATE CR 30 MG PO TB12: 30.0000 mg | ORAL_TABLET | Freq: Two times a day (BID) | ORAL | Status: DC

## 2011-01-15 MED ORDER — OXYCODONE HCL 15 MG PO TABS: 15.0000 mg | ORAL_TABLET | Freq: Four times a day (QID) | ORAL | Status: DC | PRN

## 2011-01-15 NOTE — Telephone Encounter (Signed)
Done hardcopy to dahlia/LIM B  

## 2011-01-15 NOTE — Telephone Encounter (Signed)
Pt left vm requesting Rf on Morphine 30 mg and Oxycod 15 mg. Pt is going out of town tom. Please advise ok to Rf in PCP's  Absence?

## 2011-01-15 NOTE — Telephone Encounter (Signed)
Pt informed rxs up front for pick up

## 2011-01-28 ENCOUNTER — Ambulatory Visit (INDEPENDENT_AMBULATORY_CARE_PROVIDER_SITE_OTHER): Payer: Medicare Other | Admitting: Internal Medicine

## 2011-01-28 ENCOUNTER — Encounter: Payer: Self-pay | Admitting: Internal Medicine

## 2011-01-28 ENCOUNTER — Other Ambulatory Visit: Payer: Self-pay | Admitting: *Deleted

## 2011-01-28 VITALS — BP 140/90 | HR 60 | Temp 98.8°F | Resp 16 | Wt 221.0 lb

## 2011-01-28 DIAGNOSIS — M545 Low back pain, unspecified: Secondary | ICD-10-CM

## 2011-01-28 DIAGNOSIS — M542 Cervicalgia: Secondary | ICD-10-CM

## 2011-01-28 DIAGNOSIS — Z23 Encounter for immunization: Secondary | ICD-10-CM

## 2011-01-28 DIAGNOSIS — E119 Type 2 diabetes mellitus without complications: Secondary | ICD-10-CM

## 2011-01-28 DIAGNOSIS — E291 Testicular hypofunction: Secondary | ICD-10-CM

## 2011-01-28 DIAGNOSIS — I4891 Unspecified atrial fibrillation: Secondary | ICD-10-CM

## 2011-01-28 DIAGNOSIS — Z79899 Other long term (current) drug therapy: Secondary | ICD-10-CM

## 2011-01-28 DIAGNOSIS — I10 Essential (primary) hypertension: Secondary | ICD-10-CM

## 2011-01-28 DIAGNOSIS — E538 Deficiency of other specified B group vitamins: Secondary | ICD-10-CM

## 2011-01-28 LAB — BASIC METABOLIC PANEL
CO2: 26 mEq/L (ref 19–32)
Calcium: 9.3 mg/dL (ref 8.4–10.5)
Chloride: 102 mEq/L (ref 96–112)
Sodium: 136 mEq/L (ref 135–145)

## 2011-01-28 LAB — HEMOGLOBIN A1C: Hgb A1c MFr Bld: 5.1 % (ref 4.6–6.5)

## 2011-01-28 MED ORDER — OXYCODONE HCL 15 MG PO TABS: 15.0000 mg | ORAL_TABLET | Freq: Four times a day (QID) | ORAL | Status: DC | PRN

## 2011-01-28 MED ORDER — MORPHINE SULFATE CR 30 MG PO TB12: 30.0000 mg | ORAL_TABLET | Freq: Two times a day (BID) | ORAL | Status: DC

## 2011-01-28 MED ORDER — BENAZEPRIL-HYDROCHLOROTHIAZIDE 20-12.5 MG PO TABS: 2.0000 | ORAL_TABLET | Freq: Every day | ORAL | Status: DC

## 2011-01-28 MED ORDER — BENAZEPRIL HCL 40 MG PO TABS: 40.0000 mg | ORAL_TABLET | Freq: Every day | ORAL | Status: DC

## 2011-01-28 MED ORDER — TORSEMIDE 100 MG PO TABS: 100.0000 mg | ORAL_TABLET | Freq: Every day | ORAL | Status: DC | PRN

## 2011-01-28 NOTE — Assessment & Plan Note (Signed)
Continue with current prescription therapy as reflected on the Med list.  

## 2011-01-28 NOTE — Progress Notes (Signed)
  Subjective:    Patient ID: Vernon Bishop, male    DOB: [birth year], 70 y.o.   MRN: [id number]  HPI   The patient presents for a follow-up of  chronic hypertension, chronic dyslipidemia, type 2 diabetes controlled with medicines. F/u LBP. C/o neck pain x 1 mo   Review of Systems  Constitutional: Negative for appetite change, fatigue and unexpected weight change.  HENT: Negative for nosebleeds, congestion, sore throat, sneezing, trouble swallowing and neck pain.   Eyes: Negative for itching and visual disturbance.  Respiratory: Negative for cough.   Cardiovascular: Negative for chest pain, palpitations and leg swelling.  Gastrointestinal: Negative for nausea, diarrhea, blood in stool and abdominal distention.  Genitourinary: Negative for frequency and hematuria.  Musculoskeletal: Positive for back pain, arthralgias and gait problem. Negative for joint swelling.  Skin: Negative for rash.  Neurological: Negative for dizziness, tremors, speech difficulty and weakness.  Psychiatric/Behavioral: Negative for sleep disturbance, dysphoric mood and agitation. The patient is not nervous/anxious.        Objective:   Physical Exam  Constitutional: He is oriented to person, place, and time. He appears well-developed.       Obese   HENT:  Mouth/Throat: Oropharynx is clear and moist.  Eyes: Conjunctivae are normal. Pupils are equal, round, and reactive to light.  Neck: Normal range of motion. No JVD present. No thyromegaly present.  Cardiovascular: Normal rate, regular rhythm, normal heart sounds and intact distal pulses.  Exam reveals no gallop and no friction rub.   No murmur heard. Pulmonary/Chest: Effort normal and breath sounds normal. No respiratory distress. He has no wheezes. He has no rales. He exhibits no tenderness.  Abdominal: Soft. Bowel sounds are normal. He exhibits no distension and no mass. There is no tenderness. There is no rebound and no guarding.  Musculoskeletal: Normal  range of motion. He exhibits tenderness. He exhibits no edema.       C spne and LS spine  Lymphadenopathy:    He has no cervical adenopathy.  Neurological: He is alert and oriented to person, place, and time. He has normal reflexes. No cranial nerve deficit. He exhibits normal muscle tone. Coordination normal.  Skin: Skin is warm and dry. No rash noted.  Psychiatric: He has a normal mood and affect. His behavior is normal. Judgment and thought content normal.     BP Readings from Last 3 Encounters:  01/28/11 140/90  11/26/10 180/98  08/18/10 140/80   Wt Readings from Last 3 Encounters:  01/28/11 221 lb (100.245 kg)  11/26/10 226 lb (102.513 kg)  08/18/10 217 lb (98.431 kg)        Assessment & Plan:

## 2011-01-28 NOTE — Assessment & Plan Note (Signed)
Not well controlled - see meds

## 2011-01-28 NOTE — Assessment & Plan Note (Signed)
Continue with current prescription therapy as reflected on the Med list. Contour pillow

## 2011-01-28 NOTE — Assessment & Plan Note (Signed)
  On diet  

## 2011-01-28 NOTE — Patient Instructions (Signed)
Contour pillow Sock w/beans or rice

## 2011-01-29 ENCOUNTER — Telehealth: Payer: Self-pay | Admitting: Internal Medicine

## 2011-01-29 DIAGNOSIS — E291 Testicular hypofunction: Secondary | ICD-10-CM

## 2011-01-29 DIAGNOSIS — E538 Deficiency of other specified B group vitamins: Secondary | ICD-10-CM

## 2011-01-29 MED ORDER — TESTOSTERONE CYPIONATE 200 MG/ML IM SOLN: 600.0000 mg | INTRAMUSCULAR | Status: DC

## 2011-01-29 NOTE — Telephone Encounter (Signed)
Left mess for patient to call back.  

## 2011-01-29 NOTE — Telephone Encounter (Signed)
Pt notified. Rx left on pharmacy voicemail.  Lab order entered for the week of 03/20/11.

## 2011-01-29 NOTE — Telephone Encounter (Signed)
Vernon Bishop, please, inform patient that his testost was low: start 600 mg q 2 wks Increase B12 dose - double up. Testost and B 12 in 2 mo Thx

## 2011-02-16 ENCOUNTER — Telehealth: Payer: Self-pay

## 2011-02-16 NOTE — Telephone Encounter (Signed)
[facility] called requesting to know if ok to refill pt's oxycodone and MS contin. RX says ok to fill on or after 02/18/11, but pt will be out of town

## 2011-02-17 NOTE — Telephone Encounter (Signed)
OK. Thx

## 2011-02-18 NOTE — Telephone Encounter (Signed)
Called pharmacy to inform of below. Also, called pt. He states he went back to [facility] and has already filled meds.

## 2011-02-23 ENCOUNTER — Other Ambulatory Visit: Payer: Self-pay | Admitting: *Deleted

## 2011-02-23 MED ORDER — AMLODIPINE BESYLATE 10 MG PO TABS: 10.0000 mg | ORAL_TABLET | Freq: Every day | ORAL | Status: DC

## 2011-02-24 ENCOUNTER — Telehealth: Payer: Self-pay | Admitting: Internal Medicine

## 2011-02-24 NOTE — Telephone Encounter (Signed)
Called in Rx to pharmacy for his Norvasc.

## 2011-03-11 ENCOUNTER — Other Ambulatory Visit: Payer: Self-pay | Admitting: *Deleted

## 2011-03-11 MED ORDER — PROPRANOLOL HCL 80 MG PO TABS: 80.0000 mg | ORAL_TABLET | Freq: Three times a day (TID) | ORAL | Status: DC

## 2011-03-25 ENCOUNTER — Encounter: Payer: Self-pay | Admitting: Internal Medicine

## 2011-03-25 ENCOUNTER — Ambulatory Visit (INDEPENDENT_AMBULATORY_CARE_PROVIDER_SITE_OTHER): Payer: Medicare Other | Admitting: Internal Medicine

## 2011-03-25 DIAGNOSIS — I10 Essential (primary) hypertension: Secondary | ICD-10-CM

## 2011-03-25 DIAGNOSIS — E538 Deficiency of other specified B group vitamins: Secondary | ICD-10-CM

## 2011-03-25 DIAGNOSIS — F3289 Other specified depressive episodes: Secondary | ICD-10-CM

## 2011-03-25 DIAGNOSIS — M545 Low back pain, unspecified: Secondary | ICD-10-CM

## 2011-03-25 DIAGNOSIS — F329 Major depressive disorder, single episode, unspecified: Secondary | ICD-10-CM

## 2011-03-25 DIAGNOSIS — E291 Testicular hypofunction: Secondary | ICD-10-CM

## 2011-03-25 MED ORDER — MORPHINE SULFATE CR 30 MG PO TB12: 30.0000 mg | ORAL_TABLET | Freq: Two times a day (BID) | ORAL | Status: DC

## 2011-03-25 MED ORDER — OXYCODONE HCL 15 MG PO TABS: 15.0000 mg | ORAL_TABLET | Freq: Four times a day (QID) | ORAL | Status: DC | PRN

## 2011-03-25 NOTE — Progress Notes (Signed)
Patient ID: Vernon Bishop, male   DOB: April 07, 1940, 70 y.o.   MRN: [id number]  Subjective:    Patient ID: Vernon Bishop, male    DOB: 11/22/1940, 70 y.o.   MRN: [id number]  Cough Pertinent negatives include no chest pain, rash or sore throat.  Sinusitis Associated symptoms include coughing. Pertinent negatives include no congestion, neck pain, sneezing or sore throat.     The patient presents for a follow-up of  chronic hypertension, chronic dyslipidemia, type 2 diabetes controlled with medicines. F/u LBP. C/o cough x 1 wk, URI  Review of Systems  Constitutional: Negative for appetite change, fatigue and unexpected weight change.  HENT: Negative for nosebleeds, congestion, sore throat, sneezing, trouble swallowing and neck pain.   Eyes: Negative for itching and visual disturbance.  Respiratory: Positive for cough.   Cardiovascular: Negative for chest pain, palpitations and leg swelling.  Gastrointestinal: Negative for nausea, diarrhea, blood in stool and abdominal distention.  Genitourinary: Negative for frequency and hematuria.  Musculoskeletal: Positive for back pain, arthralgias and gait problem. Negative for joint swelling.  Skin: Negative for rash.  Neurological: Negative for dizziness, tremors, speech difficulty and weakness.  Psychiatric/Behavioral: Negative for sleep disturbance, dysphoric mood and agitation. The patient is not nervous/anxious.        Objective:   Physical Exam  Constitutional: He is oriented to person, place, and time. He appears well-developed.       Obese   HENT:  Mouth/Throat: Oropharynx is clear and moist.  Eyes: Conjunctivae are normal. Pupils are equal, round, and reactive to light.  Neck: Normal range of motion. No JVD present. No thyromegaly present.  Cardiovascular: Normal rate, regular rhythm, normal heart sounds and intact distal pulses.  Exam reveals no gallop and no friction rub.   No murmur heard. Pulmonary/Chest: Effort normal and breath  sounds normal. No respiratory distress. He has no wheezes. He has no rales. He exhibits no tenderness.  Abdominal: Soft. Bowel sounds are normal. He exhibits no distension and no mass. There is no tenderness. There is no rebound and no guarding.  Musculoskeletal: Normal range of motion. He exhibits tenderness. He exhibits no edema.       C spne and LS spine  Lymphadenopathy:    He has no cervical adenopathy.  Neurological: He is alert and oriented to person, place, and time. He has normal reflexes. No cranial nerve deficit. He exhibits normal muscle tone. Coordination normal.  Skin: Skin is warm and dry. No rash noted.  Psychiatric: He has a normal mood and affect. His behavior is normal. Judgment and thought content normal.     BP Readings from Last 3 Encounters:  03/25/11 138/54  01/28/11 140/90  11/26/10 180/98   Wt Readings from Last 3 Encounters:  03/25/11 221 lb (100.245 kg)  01/28/11 221 lb (100.245 kg)  11/26/10 226 lb (102.513 kg)            Assessment & Plan:

## 2011-03-26 NOTE — Assessment & Plan Note (Signed)
Continue with current prescription therapy as reflected on the Med list.  

## 2011-03-26 NOTE — Assessment & Plan Note (Signed)
Continue with current prescription therapy as reflected on the Med list. BP Readings from Last 3 Encounters:  03/25/11 138/54  01/28/11 140/90  11/26/10 180/98

## 2011-05-08 ENCOUNTER — Telehealth: Payer: Self-pay | Admitting: Internal Medicine

## 2011-05-08 NOTE — Telephone Encounter (Signed)
OK to fill this prescription with additional refills x3 Thank you!  

## 2011-05-08 NOTE — Telephone Encounter (Signed)
Pt req refill for CLONAZEPAM 1MG  TAB. Take 1 tab by mouth twice a day prn. Last refill 10/2010. Ok to refill?

## 2011-05-11 MED ORDER — CLONAZEPAM 1 MG PO TABS: 1.0000 mg | ORAL_TABLET | Freq: Two times a day (BID) | ORAL | Status: DC | PRN

## 2011-05-13 ENCOUNTER — Other Ambulatory Visit: Payer: Self-pay | Admitting: *Deleted

## 2011-05-13 MED ORDER — BUPROPION HCL ER (SR) 150 MG PO TB12: 150.0000 mg | ORAL_TABLET | Freq: Two times a day (BID) | ORAL | Status: DC

## 2011-06-08 ENCOUNTER — Encounter: Payer: Self-pay | Admitting: Internal Medicine

## 2011-06-08 ENCOUNTER — Other Ambulatory Visit (INDEPENDENT_AMBULATORY_CARE_PROVIDER_SITE_OTHER): Payer: Medicare Other

## 2011-06-08 ENCOUNTER — Ambulatory Visit (INDEPENDENT_AMBULATORY_CARE_PROVIDER_SITE_OTHER): Payer: Medicare Other | Admitting: Internal Medicine

## 2011-06-08 VITALS — BP 158/70 | HR 64 | Temp 98.3°F | Resp 16 | Wt 225.0 lb

## 2011-06-08 DIAGNOSIS — M542 Cervicalgia: Secondary | ICD-10-CM

## 2011-06-08 DIAGNOSIS — R209 Unspecified disturbances of skin sensation: Secondary | ICD-10-CM

## 2011-06-08 DIAGNOSIS — R202 Paresthesia of skin: Secondary | ICD-10-CM

## 2011-06-08 DIAGNOSIS — E119 Type 2 diabetes mellitus without complications: Secondary | ICD-10-CM

## 2011-06-08 DIAGNOSIS — I10 Essential (primary) hypertension: Secondary | ICD-10-CM

## 2011-06-08 DIAGNOSIS — Z79899 Other long term (current) drug therapy: Secondary | ICD-10-CM

## 2011-06-08 DIAGNOSIS — E785 Hyperlipidemia, unspecified: Secondary | ICD-10-CM

## 2011-06-08 DIAGNOSIS — M2550 Pain in unspecified joint: Secondary | ICD-10-CM | POA: Insufficient documentation

## 2011-06-08 DIAGNOSIS — M545 Low back pain: Secondary | ICD-10-CM

## 2011-06-08 LAB — VITAMIN B12: Vitamin B-12: 403 pg/mL (ref 211–911)

## 2011-06-08 LAB — CK: Total CK: 36 U/L (ref 7–232)

## 2011-06-08 MED ORDER — PREDNISOLONE 5 MG PO TABS: 15.0000 mg | ORAL_TABLET | Freq: Every day | ORAL | Status: DC

## 2011-06-08 MED ORDER — OXYCODONE HCL 15 MG PO TABS: 15.0000 mg | ORAL_TABLET | Freq: Four times a day (QID) | ORAL | Status: DC | PRN

## 2011-06-08 MED ORDER — MORPHINE SULFATE CR 30 MG PO TB12: 30.0000 mg | ORAL_TABLET | Freq: Two times a day (BID) | ORAL | Status: DC

## 2011-06-08 NOTE — Assessment & Plan Note (Signed)
Not on statins - diet

## 2011-06-08 NOTE — Assessment & Plan Note (Signed)
3/13 diffuse - ?[facility] Trial of Prednisone 15 mg/d pc

## 2011-06-08 NOTE — Assessment & Plan Note (Signed)
Continue with current prescription therapy as reflected on the Med list.  

## 2011-06-08 NOTE — Progress Notes (Signed)
Patient ID: Vernon Bishop, male   DOB: 1940/10/17, 71 y.o.   MRN: [id number]  Subjective:    Patient ID: Vernon Bishop, male    DOB: [birth year], 71 y.o.   MRN: [id number]  Back Pain Pertinent negatives include no chest pain, fever or weakness.     The patient presents for a follow-up of  chronic hypertension, chronic dyslipidemia, type 2 diabetes controlled with medicines. F/u LBP. C/o neck pain/LBP -  Worse x 1 mo; stiff, 7/10 pain  BP Readings from Last 3 Encounters:  06/08/11 158/70  03/25/11 138/54  01/28/11 140/90   Wt Readings from Last 3 Encounters:  06/08/11 225 lb (102.059 kg)  03/25/11 221 lb (100.245 kg)  01/28/11 221 lb (100.245 kg)       Review of Systems  Constitutional: Positive for diaphoresis. Negative for fever, appetite change, fatigue and unexpected weight change.  HENT: Negative for nosebleeds, congestion, sore throat, sneezing, trouble swallowing and neck pain.   Eyes: Negative for itching and visual disturbance.  Respiratory: Negative for cough.   Cardiovascular: Negative for chest pain, palpitations and leg swelling.  Gastrointestinal: Negative for nausea, diarrhea, blood in stool and abdominal distention.  Genitourinary: Negative for frequency and hematuria.  Musculoskeletal: Positive for myalgias, back pain, arthralgias and gait problem. Negative for joint swelling.  Skin: Negative for rash.  Neurological: Negative for dizziness, tremors, speech difficulty and weakness.  Psychiatric/Behavioral: Negative for sleep disturbance, dysphoric mood and agitation. The patient is not nervous/anxious.        Objective:   Physical Exam  Constitutional: He is oriented to person, place, and time. He appears well-developed.       Obese   HENT:  Mouth/Throat: Oropharynx is clear and moist.  Eyes: Conjunctivae are normal. Pupils are equal, round, and reactive to light.  Neck: Normal range of motion. No JVD present. No thyromegaly present.  Cardiovascular:  Normal rate, regular rhythm, normal heart sounds and intact distal pulses.  Exam reveals no gallop and no friction rub.   No murmur heard. Pulmonary/Chest: Effort normal and breath sounds normal. No respiratory distress. He has no wheezes. He has no rales. He exhibits no tenderness.  Abdominal: Soft. Bowel sounds are normal. He exhibits no distension and no mass. There is no tenderness. There is no rebound and no guarding.  Musculoskeletal: Normal range of motion. He exhibits tenderness. He exhibits no edema.       C spne and LS spine  Lymphadenopathy:    He has no cervical adenopathy.  Neurological: He is alert and oriented to person, place, and time. He has normal reflexes. No cranial nerve deficit. He exhibits normal muscle tone. Coordination normal.  Skin: Skin is warm and dry. No rash noted.  Psychiatric: He has a normal mood and affect. His behavior is normal. Judgment and thought content normal.     Lab Results  Component Value Date   WBC 9.1 08/13/2010   HGB 14.4 08/13/2010   HCT 42.1 08/13/2010   PLT 291.0 08/13/2010   GLUCOSE 79 01/28/2011   CHOL 128 12/09/2009   TRIG 134.0 12/09/2009   HDL 20.40* 12/09/2009   LDLCALC 81 12/09/2009   ALT 18 08/13/2010   AST 18 08/13/2010   NA 136 01/28/2011   K 4.6 01/28/2011   CL 102 01/28/2011   CREATININE 0.9 01/28/2011   BUN 18 01/28/2011   CO2 26 01/28/2011   TSH 1.89 01/28/2011   PSA 0.57 12/09/2009   HGBA1C 5.1 01/28/2011  Assessment & Plan:

## 2011-06-08 NOTE — Assessment & Plan Note (Signed)
Monitoring

## 2011-06-09 LAB — RHEUMATOID FACTOR: Rhuematoid fact SerPl-aCnc: <10 IU/mL (ref <=14)

## 2011-07-13 ENCOUNTER — Encounter: Payer: Self-pay | Admitting: Internal Medicine

## 2011-07-13 ENCOUNTER — Ambulatory Visit (INDEPENDENT_AMBULATORY_CARE_PROVIDER_SITE_OTHER): Payer: Medicare Other | Admitting: Internal Medicine

## 2011-07-13 VITALS — BP 130/62 | HR 80 | Temp 98.4°F | Resp 16 | Wt 230.0 lb

## 2011-07-13 DIAGNOSIS — R202 Paresthesia of skin: Secondary | ICD-10-CM

## 2011-07-13 DIAGNOSIS — F329 Major depressive disorder, single episode, unspecified: Secondary | ICD-10-CM

## 2011-07-13 DIAGNOSIS — M545 Low back pain, unspecified: Secondary | ICD-10-CM

## 2011-07-13 DIAGNOSIS — E538 Deficiency of other specified B group vitamins: Secondary | ICD-10-CM

## 2011-07-13 DIAGNOSIS — I10 Essential (primary) hypertension: Secondary | ICD-10-CM

## 2011-07-13 DIAGNOSIS — E785 Hyperlipidemia, unspecified: Secondary | ICD-10-CM

## 2011-07-13 DIAGNOSIS — M2550 Pain in unspecified joint: Secondary | ICD-10-CM

## 2011-07-13 DIAGNOSIS — E119 Type 2 diabetes mellitus without complications: Secondary | ICD-10-CM

## 2011-07-13 DIAGNOSIS — R209 Unspecified disturbances of skin sensation: Secondary | ICD-10-CM

## 2011-07-13 DIAGNOSIS — M25519 Pain in unspecified shoulder: Secondary | ICD-10-CM | POA: Insufficient documentation

## 2011-07-13 DIAGNOSIS — F3289 Other specified depressive episodes: Secondary | ICD-10-CM

## 2011-07-13 DIAGNOSIS — M542 Cervicalgia: Secondary | ICD-10-CM

## 2011-07-13 MED ORDER — PREDNISOLONE 5 MG PO TABS: 10.0000 mg | ORAL_TABLET | Freq: Every day | ORAL | Status: DC

## 2011-07-13 MED ORDER — OXYCODONE HCL 15 MG PO TABS: 15.0000 mg | ORAL_TABLET | Freq: Four times a day (QID) | ORAL | Status: DC | PRN

## 2011-07-13 MED ORDER — MORPHINE SULFATE CR 30 MG PO TB12: 30.0000 mg | ORAL_TABLET | Freq: Two times a day (BID) | ORAL | Status: DC

## 2011-07-13 NOTE — Progress Notes (Signed)
  Subjective:    Patient ID: Vernon Bishop, male    DOB: 08-12-1940, 71 y.o.   MRN: [id number]  HPI  C/o L shoulder popped and hurt after he opened a window 1 wk ago  The patient is here to follow up on chronic depression, anxiety, headaches and chronic moderate LBP and PMR symptoms controlled with medicines, diet and exercise. He is better on Prednisone.   Review of Systems  Constitutional: Positive for unexpected weight change. Negative for chills, appetite change and fatigue.  HENT: Negative for nosebleeds, congestion, sore throat, sneezing, trouble swallowing and neck pain.   Eyes: Negative for itching and visual disturbance.  Respiratory: Negative for cough and shortness of breath.   Cardiovascular: Negative for chest pain, palpitations and leg swelling.  Gastrointestinal: Negative for nausea, diarrhea, blood in stool and abdominal distention.  Genitourinary: Negative for frequency and hematuria.  Musculoskeletal: Positive for back pain. Negative for joint swelling, arthralgias and gait problem.  Skin: Negative for rash.  Neurological: Negative for dizziness, tremors, speech difficulty, weakness, light-headedness and headaches.  Psychiatric/Behavioral: Negative for suicidal ideas, sleep disturbance, dysphoric mood and agitation. The patient is not nervous/anxious.        Objective:   Physical Exam  Constitutional: He is oriented to person, place, and time. He appears well-developed.  HENT:  Mouth/Throat: Oropharynx is clear and moist.  Eyes: Conjunctivae are normal. Pupils are equal, round, and reactive to light.  Neck: Normal range of motion. No JVD present. No thyromegaly present.  Cardiovascular: Normal rate, regular rhythm, normal heart sounds and intact distal pulses.  Exam reveals no gallop and no friction rub.   No murmur heard. Pulmonary/Chest: Effort normal and breath sounds normal. No respiratory distress. He has no wheezes. He has no rales. He exhibits no tenderness.   Abdominal: Soft. Bowel sounds are normal. He exhibits no distension and no mass. There is no tenderness. There is no rebound and no guarding.  Musculoskeletal: Normal range of motion. He exhibits no edema and no tenderness.       L anterior prox arm sq lump  Lymphadenopathy:    He has no cervical adenopathy.  Neurological: He is alert and oriented to person, place, and time. He has normal reflexes. No cranial nerve deficit. He exhibits normal muscle tone. Coordination normal.  Skin: Skin is warm and dry. No rash noted.  Psychiatric: He has a normal mood and affect. His behavior is normal. Judgment and thought content normal.      Procedure Note :    Procedure :   Point of care (POC) sonography examination   Indication: L shoulder pain   Equipment used: Sonosite M-Turbo with HFL38x/13-6 MHz transducer linear probe. The images were stored in the unit and later transferred in storage.  The patient was placed in a sitting position.  This study revealed a heteroechoic small subcutaneous lesion in the anterior prox aspect of L arm   Impression: US findings c/w lipoma       Assessment & Plan:

## 2011-07-13 NOTE — Assessment & Plan Note (Signed)
Continue with current prescription therapy as reflected on the Med list.  

## 2011-07-13 NOTE — Assessment & Plan Note (Signed)
3/13 diffuse - ?PMR Labs Trial of Prednisone  Potential benefits of a long term steroid  use as well as potential risks  and complications were explained to the patient and were aknowledged.  Much better. Will reduce Predn to 10 mg/d

## 2011-07-21 ENCOUNTER — Encounter: Payer: Self-pay | Admitting: Internal Medicine

## 2011-07-21 NOTE — Assessment & Plan Note (Signed)
Chronic, diet controlled Will watch closely while on Prednisone

## 2011-07-21 NOTE — Assessment & Plan Note (Signed)
Continue with current prescription therapy as reflected on the Med list.  

## 2011-08-12 ENCOUNTER — Encounter: Payer: Self-pay | Admitting: Internal Medicine

## 2011-08-12 ENCOUNTER — Ambulatory Visit (INDEPENDENT_AMBULATORY_CARE_PROVIDER_SITE_OTHER): Payer: Medicare Other | Admitting: Internal Medicine

## 2011-08-12 VITALS — BP 142/82 | HR 80 | Temp 98.4°F | Resp 16 | Wt 224.0 lb

## 2011-08-12 DIAGNOSIS — R209 Unspecified disturbances of skin sensation: Secondary | ICD-10-CM

## 2011-08-12 DIAGNOSIS — E538 Deficiency of other specified B group vitamins: Secondary | ICD-10-CM

## 2011-08-12 DIAGNOSIS — E785 Hyperlipidemia, unspecified: Secondary | ICD-10-CM

## 2011-08-12 DIAGNOSIS — M25519 Pain in unspecified shoulder: Secondary | ICD-10-CM

## 2011-08-12 DIAGNOSIS — I10 Essential (primary) hypertension: Secondary | ICD-10-CM

## 2011-08-12 DIAGNOSIS — R202 Paresthesia of skin: Secondary | ICD-10-CM

## 2011-08-12 DIAGNOSIS — E119 Type 2 diabetes mellitus without complications: Secondary | ICD-10-CM

## 2011-08-12 DIAGNOSIS — M545 Low back pain: Secondary | ICD-10-CM

## 2011-08-12 DIAGNOSIS — M542 Cervicalgia: Secondary | ICD-10-CM

## 2011-08-12 DIAGNOSIS — M2550 Pain in unspecified joint: Secondary | ICD-10-CM

## 2011-08-12 MED ORDER — GABAPENTIN 300 MG PO CAPS: 300.0000 mg | ORAL_CAPSULE | Freq: Three times a day (TID) | ORAL | Status: DC

## 2011-08-12 MED ORDER — OXYCODONE HCL 15 MG PO TABS: 15.0000 mg | ORAL_TABLET | Freq: Four times a day (QID) | ORAL | Status: DC | PRN

## 2011-08-12 MED ORDER — MORPHINE SULFATE ER 30 MG PO TBCR: 30.0000 mg | EXTENDED_RELEASE_TABLET | Freq: Two times a day (BID) | ORAL | Status: DC

## 2011-08-12 NOTE — Assessment & Plan Note (Signed)
Better  

## 2011-08-12 NOTE — Progress Notes (Signed)
Patient ID: Vernon Bishop, male   DOB: 08/03/1940, 71 y.o.   MRN: [id number]  Subjective:    Patient ID: Vernon Bishop, male    DOB: 10-May-1940, 71 y.o.   MRN: [id number]  HPI    The patient is here to follow up on chronic depression, anxiety, headaches and chronic moderate-severe LBP and PMR symptoms. Pain is not controlled with medicines well,  He is not better on Prednisone any longer.  Wt Readings from Last 3 Encounters:  08/12/11 224 lb (101.606 kg)  07/13/11 230 lb (104.327 kg)  06/08/11 225 lb (102.059 kg)   BP Readings from Last 3 Encounters:  08/12/11 142/82  07/13/11 130/62  06/08/11 158/70   BP 142/82  Pulse 80  Temp(Src) 98.4 F (36.9 C) (Oral)  Resp 16  Wt 224 lb (101.606 kg)  Current Outpatient Prescriptions on File Prior to Visit  Medication Sig Dispense Refill  . amLODipine (NORVASC) 10 MG tablet Take 1 tablet (10 mg total) by mouth daily.  90 tablet  2  . aspirin 81 MG EC tablet Take 81 mg by mouth daily.        . benazepril-hydrochlorthiazide (LOTENSIN HCT) 20-12.5 MG per tablet Take 2 tablets by mouth daily.  60 tablet  11  . buPROPion (WELLBUTRIN SR) 150 MG 12 hr tablet Take 1 tablet (150 mg total) by mouth 2 (two) times daily.  60 tablet  5  . Cholecalciferol (VITAMIN D3) 1000 UNIT tablet Take 1,000 Units by mouth daily.        . clonazePAM (KLONOPIN) 1 MG tablet Take 1 tablet (1 mg total) by mouth 2 (two) times daily as needed.  60 tablet  5  . clotrimazole-betamethasone (LOTRISONE) cream Apply topically 2 (two) times daily. X 1 month as needed       . Cyanocobalamin (VITAMIN B-12 CR) 1000 MCG TBCR Take 1,000 mcg by mouth daily.        . cyclobenzaprine (FLEXERIL) 10 MG tablet TAKE 1 TABLET BY MOUTH 3 TIMES A DAY  90 tablet  3  . digoxin (LANOXIN) 0.25 MG tablet Take 1.5 tablets (375 mcg total) by mouth daily.  135 tablet  3  . FLUoxetine (PROZAC) 40 MG capsule TAKE 1 CAPSULE EVERY DAY  90 capsule  1  . morphine (MS CONTIN) 30 MG 12 hr tablet Take 1  tablet (30 mg total) by mouth 2 (two) times daily. Fill on or after 08/16/11 ... OK to fill 1-2 days early when month has 31 days  60 tablet  0  . prednisoLONE 5 MG TABS Take 2 tablets (10 mg total) by mouth daily.  100 each  5  . propranolol (INDERAL) 80 MG tablet Take 1 tablet (80 mg total) by mouth 3 (three) times daily.  270 tablet  1  . SYRINGE-NEEDLE, DISP, 3 ML (BD INTEGRA SYRINGE) 25G X 1" 3 ML MISC by Does not apply route as directed.        . testosterone cypionate (DEPOTESTOTERONE CYPIONATE) 200 MG/ML injection Inject 3 mLs (600 mg total) into the muscle every 14 (fourteen) days.  20 mL  5  . torsemide (DEMADEX) 100 MG tablet Take 1 tablet (100 mg total) by mouth daily as needed.  30 tablet  11  . DISCONTD: oxyCODONE (ROXICODONE) 15 MG immediate release tablet Take 1 tablet (15 mg total) by mouth 4 (four) times daily as needed. Fill on or after 08/16/11 .Marland KitchenMarland KitchenOk to fill 1-2 days early when month has 31 days  120  tablet  0  . gabapentin (NEURONTIN) 300 MG capsule Take 1 capsule (300 mg total) by mouth 3 (three) times daily.  90 capsule  3   Past Medical History  Diagnosis Date  . Diabetes mellitus     type II  . Hypertension   . LBP (low back pain)   . Palpitations   . ED (erectile dysfunction)   . OSA on CPAP   . Hypogonadism male   . Vitamin d deficiency   . Osteoarthritis, hand   . Hyperlipidemia   . Atrial fibrillation   . Depression   . PTSD (post-traumatic stress disorder)   . Osteoarthritis    Past Surgical History  Procedure Date  . Back surgery 1987  . Cervical laminectomy 2004    [name]  . Appendectomy   . Cholecystectomy     reports that he quit smoking about 40 years ago. His smoking use included Cigarettes. He smoked 1 pack per day. He does not have any smokeless tobacco history on file. He reports that he does not drink alcohol or use illicit drugs. family history includes Allergies in his mother; Coronary artery disease in his other; Diabetes in his other;  Heart attack in his father; Heart disease in his father; Hypertension in his other; Other in his paternal grandmother; Rheum arthritis in his paternal grandmother; and Stroke in his mother. Allergies  Allergen Reactions  . Amlodipine Besylate     REACTION: swelling  . Cymbalta (Duloxetine Hcl)     constipation  . Duloxetine     REACTION: constipation  . Temazepam      Review of Systems  Constitutional: Positive for fatigue and unexpected weight change. Negative for chills and appetite change.  HENT: Negative for nosebleeds, congestion, sore throat, sneezing, trouble swallowing and neck pain.   Eyes: Negative for itching and visual disturbance.  Respiratory: Negative for cough and shortness of breath.   Cardiovascular: Negative for chest pain, palpitations and leg swelling.  Gastrointestinal: Negative for nausea, diarrhea, blood in stool and abdominal distention.  Genitourinary: Negative for frequency and hematuria.  Musculoskeletal: Positive for back pain and arthralgias (better). Negative for joint swelling and gait problem.  Skin: Negative for rash.  Neurological: Negative for dizziness, tremors, speech difficulty, weakness, light-headedness and headaches.  Psychiatric/Behavioral: Positive for sleep disturbance and dysphoric mood. Negative for suicidal ideas and agitation. The patient is not nervous/anxious.        Objective:   Physical Exam  Constitutional: He is oriented to person, place, and time. He appears well-developed.       obese  HENT:  Mouth/Throat: Oropharynx is clear and moist.  Eyes: Conjunctivae are normal. Pupils are equal, round, and reactive to light.  Neck: Normal range of motion. No JVD present. No thyromegaly present.  Cardiovascular: Normal rate, regular rhythm, normal heart sounds and intact distal pulses.  Exam reveals no gallop and no friction rub.   No murmur heard. Pulmonary/Chest: Effort normal and breath sounds normal. No respiratory distress. He  has no wheezes. He has no rales. He exhibits no tenderness.  Abdominal: Soft. Bowel sounds are normal. He exhibits no distension and no mass. There is no tenderness. There is no rebound and no guarding.  Musculoskeletal: Normal range of motion. He exhibits tenderness (all spine). He exhibits no edema.       L anterior prox arm sq lump  Lymphadenopathy:    He has no cervical adenopathy.  Neurological: He is alert and oriented to person, place, and time. He  has normal reflexes. No cranial nerve deficit. He exhibits normal muscle tone. Coordination normal.  Skin: Skin is warm and dry. No rash noted.  Psychiatric: His behavior is normal. Judgment and thought content normal.       Depressed, not suicidal or homicidal     Lab Results  Component Value Date   WBC 9.1 08/13/2010   HGB 14.4 08/13/2010   HCT 42.1 08/13/2010   PLT 291.0 08/13/2010   GLUCOSE 79 01/28/2011   CHOL 128 12/09/2009   TRIG 134.0 12/09/2009   HDL 20.40* 12/09/2009   LDLCALC 81 12/09/2009   ALT 18 08/13/2010   AST 18 08/13/2010   NA 136 01/28/2011   K 4.6 01/28/2011   CL 102 01/28/2011   CREATININE 0.9 01/28/2011   BUN 18 01/28/2011   CO2 26 01/28/2011   TSH 2.66 06/08/2011   PSA 0.57 12/09/2009   HGBA1C 4.9 06/08/2011         Assessment & Plan:

## 2011-08-12 NOTE — Assessment & Plan Note (Signed)
Pain med cons Dr Ethelene Hal Continue with current prescription therapy as reflected on the Med list. Added Gabapentin

## 2011-08-12 NOTE — Assessment & Plan Note (Signed)
Continue with current prescription therapy as reflected on the Med list.  

## 2011-08-12 NOTE — Assessment & Plan Note (Signed)
3/13 diffuse - ?PMR Labs Trial of Prednisone - a little better; cont w/10 mg a day  Potential benefits of a long term steroid  use as well as potential risks  and complications were explained to the patient and were aknowledged.

## 2011-08-12 NOTE — Assessment & Plan Note (Signed)
Not on statins - diet

## 2011-08-12 NOTE — Assessment & Plan Note (Signed)
MSK - chronic  Potential benefits of a long term opioids use as well as potential risks (i.e. addiction risk, apnea etc) and complications (i.e. Somnolence, constipation and others) were explained to the patient and were aknowledged. 5/13 - worse Pain med cons w/Dr Ethelene Hal Continue with current prescription therapy as reflected on the Med list.

## 2011-08-14 ENCOUNTER — Other Ambulatory Visit: Payer: Self-pay | Admitting: *Deleted

## 2011-08-14 MED ORDER — FLUOXETINE HCL 40 MG PO CAPS: 40.0000 mg | ORAL_CAPSULE | Freq: Every day | ORAL | Status: DC

## 2011-09-09 ENCOUNTER — Other Ambulatory Visit: Payer: Self-pay | Admitting: *Deleted

## 2011-09-09 DIAGNOSIS — I10 Essential (primary) hypertension: Secondary | ICD-10-CM

## 2011-09-09 DIAGNOSIS — E119 Type 2 diabetes mellitus without complications: Secondary | ICD-10-CM

## 2011-09-09 DIAGNOSIS — M542 Cervicalgia: Secondary | ICD-10-CM

## 2011-09-09 DIAGNOSIS — M545 Low back pain: Secondary | ICD-10-CM

## 2011-09-09 DIAGNOSIS — R202 Paresthesia of skin: Secondary | ICD-10-CM

## 2011-09-09 DIAGNOSIS — M2550 Pain in unspecified joint: Secondary | ICD-10-CM

## 2011-09-09 DIAGNOSIS — E785 Hyperlipidemia, unspecified: Secondary | ICD-10-CM

## 2011-09-09 MED ORDER — CLONAZEPAM 1 MG PO TABS: 1.0000 mg | ORAL_TABLET | Freq: Two times a day (BID) | ORAL | Status: DC | PRN

## 2011-09-09 NOTE — Telephone Encounter (Signed)
Ok early ref There will be no more early ref. Pls stay on schedule w/pain med Ok Clonazepam Thx

## 2011-09-09 NOTE — Telephone Encounter (Signed)
Rf req for clonazepam 1 mg 1 po bid. # 60. Ok to Rf?

## 2011-09-09 NOTE — Telephone Encounter (Signed)
Pt called requesting authorization to fill his pain medications early. States he has already used last Rx, please advise.

## 2011-09-09 NOTE — Telephone Encounter (Signed)
Clonazepam phoned in.

## 2011-09-10 NOTE — Telephone Encounter (Signed)
Will hold/print Rxs tom 09/11/11 when PCP is in office to sign.

## 2011-09-10 NOTE — Telephone Encounter (Signed)
Called pt to get clarification on what meds he is needing.. No answer/machine.

## 2011-09-11 MED ORDER — MORPHINE SULFATE ER 30 MG PO TBCR: 30.0000 mg | EXTENDED_RELEASE_TABLET | Freq: Two times a day (BID) | ORAL | Status: DC

## 2011-09-11 MED ORDER — OXYCODONE HCL 15 MG PO TABS: 15.0000 mg | ORAL_TABLET | Freq: Four times a day (QID) | ORAL | Status: DC | PRN

## 2011-09-11 NOTE — Telephone Encounter (Signed)
Rx's upfront for p/u. Pt's wife was informed on 09/10/11.

## 2011-10-08 ENCOUNTER — Telehealth: Payer: Self-pay | Admitting: *Deleted

## 2011-10-08 MED ORDER — TESTOSTERONE CYPIONATE 200 MG/ML IM SOLN: 600.0000 mg | INTRAMUSCULAR | Status: DC

## 2011-10-08 NOTE — Telephone Encounter (Signed)
OK to fill this prescription with additional refills x5 Thank you!  

## 2011-10-08 NOTE — Telephone Encounter (Signed)
Done

## 2011-10-08 NOTE — Telephone Encounter (Signed)
Rf req for Testosterone 200 mg/ml inject 600 mg into muscle q 14 days. Ok to Rf?

## 2011-10-16 ENCOUNTER — Other Ambulatory Visit (INDEPENDENT_AMBULATORY_CARE_PROVIDER_SITE_OTHER): Payer: Medicare Other

## 2011-10-16 ENCOUNTER — Encounter: Payer: Self-pay | Admitting: Internal Medicine

## 2011-10-16 ENCOUNTER — Ambulatory Visit (INDEPENDENT_AMBULATORY_CARE_PROVIDER_SITE_OTHER): Payer: Medicare Other | Admitting: Internal Medicine

## 2011-10-16 VITALS — BP 128/70 | HR 72 | Temp 98.6°F | Resp 16 | Wt 243.0 lb

## 2011-10-16 DIAGNOSIS — Z79899 Other long term (current) drug therapy: Secondary | ICD-10-CM

## 2011-10-16 DIAGNOSIS — E785 Hyperlipidemia, unspecified: Secondary | ICD-10-CM

## 2011-10-16 DIAGNOSIS — R202 Paresthesia of skin: Secondary | ICD-10-CM

## 2011-10-16 DIAGNOSIS — E291 Testicular hypofunction: Secondary | ICD-10-CM

## 2011-10-16 DIAGNOSIS — M545 Low back pain: Secondary | ICD-10-CM

## 2011-10-16 DIAGNOSIS — M2550 Pain in unspecified joint: Secondary | ICD-10-CM

## 2011-10-16 DIAGNOSIS — G4733 Obstructive sleep apnea (adult) (pediatric): Secondary | ICD-10-CM

## 2011-10-16 DIAGNOSIS — M25519 Pain in unspecified shoulder: Secondary | ICD-10-CM

## 2011-10-16 DIAGNOSIS — E538 Deficiency of other specified B group vitamins: Secondary | ICD-10-CM

## 2011-10-16 DIAGNOSIS — M542 Cervicalgia: Secondary | ICD-10-CM

## 2011-10-16 DIAGNOSIS — E119 Type 2 diabetes mellitus without complications: Secondary | ICD-10-CM

## 2011-10-16 DIAGNOSIS — I10 Essential (primary) hypertension: Secondary | ICD-10-CM

## 2011-10-16 DIAGNOSIS — R209 Unspecified disturbances of skin sensation: Secondary | ICD-10-CM

## 2011-10-16 LAB — BASIC METABOLIC PANEL
BUN: 10 mg/dL (ref 6–23)
Chloride: 92 mEq/L — ABNORMAL LOW (ref 96–112)
GFR: 81.12 mL/min (ref >60.00)
Glucose, Bld: 117 mg/dL — ABNORMAL HIGH (ref 70–99)
Potassium: 3.6 mEq/L (ref 3.5–5.1)
Sodium: 130 mEq/L — ABNORMAL LOW (ref 135–145)

## 2011-10-16 LAB — SEDIMENTATION RATE: Sed Rate: 18 mm/hr (ref 0–22)

## 2011-10-16 MED ORDER — "SYRINGE 22G X 1"" 3 ML MISC": 1.0000 | Status: DC

## 2011-10-16 MED ORDER — CLOTRIMAZOLE-BETAMETHASONE 1-0.05 % EX CREA: TOPICAL_CREAM | Freq: Two times a day (BID) | CUTANEOUS | Status: DC

## 2011-10-16 MED ORDER — OXYCODONE HCL 15 MG PO TABS: 15.0000 mg | ORAL_TABLET | Freq: Four times a day (QID) | ORAL | Status: DC | PRN

## 2011-10-16 MED ORDER — MORPHINE SULFATE ER 30 MG PO TBCR: 30.0000 mg | EXTENDED_RELEASE_TABLET | Freq: Two times a day (BID) | ORAL | Status: DC

## 2011-10-16 NOTE — Assessment & Plan Note (Signed)
Continue with current prescription therapy as reflected on the Med list.  

## 2011-10-16 NOTE — Assessment & Plan Note (Signed)
Continue with current prescription therapy as reflected on the Med list. BP Readings from Last 3 Encounters:  10/16/11 128/70  08/12/11 142/82  07/13/11 130/62

## 2011-10-16 NOTE — Progress Notes (Signed)
Subjective:    Patient ID: Vernon Bishop, male    DOB: 12/16/1940, 71 y.o.   MRN: [id number]  HPI    The patient is here to follow up on chronic depression, anxiety, headaches and chronic moderate-severe LBP and PMR symptoms. Pain is better controlled with medicines well.  He was not better on Prednisone and he stopped it. Needs a new CPAP machine  Wt Readings from Last 3 Encounters:  10/16/11 243 lb (110.224 kg)  08/12/11 224 lb (101.606 kg)  07/13/11 230 lb (104.327 kg)   BP Readings from Last 3 Encounters:  10/16/11 128/70  08/12/11 142/82  07/13/11 130/62   BP 128/70  Pulse 72  Temp 98.6 F (37 C) (Oral)  Resp 16  Wt 243 lb (110.224 kg)  Current Outpatient Prescriptions on File Prior to Visit  Medication Sig Dispense Refill  . amLODipine (NORVASC) 10 MG tablet Take 1 tablet (10 mg total) by mouth daily.  90 tablet  2  . aspirin 81 MG EC tablet Take 81 mg by mouth daily.        . benazepril-hydrochlorthiazide (LOTENSIN HCT) 20-12.5 MG per tablet Take 2 tablets by mouth daily.  60 tablet  11  . buPROPion (WELLBUTRIN SR) 150 MG 12 hr tablet Take 1 tablet (150 mg total) by mouth 2 (two) times daily.  60 tablet  5  . Cholecalciferol (VITAMIN D3) 1000 UNIT tablet Take 1,000 Units by mouth daily.        . clonazePAM (KLONOPIN) 1 MG tablet Take 1 tablet (1 mg total) by mouth 2 (two) times daily as needed.  60 tablet  5  . clotrimazole-betamethasone (LOTRISONE) cream Apply topically 2 (two) times daily. X 1 month as needed       . Cyanocobalamin (VITAMIN B-12 CR) 1000 MCG TBCR Take 1,000 mcg by mouth daily.        . cyclobenzaprine (FLEXERIL) 10 MG tablet TAKE 1 TABLET BY MOUTH 3 TIMES A DAY  90 tablet  3  . digoxin (LANOXIN) 0.25 MG tablet Take 1.5 tablets (375 mcg total) by mouth daily.  135 tablet  3  . FLUoxetine (PROZAC) 40 MG capsule Take 1 capsule (40 mg total) by mouth daily.  90 capsule  2  . gabapentin (NEURONTIN) 300 MG capsule Take 1 capsule (300 mg total) by mouth 3  (three) times daily.  90 capsule  3  . morphine (MS CONTIN) 30 MG 12 hr tablet Take 1 tablet (30 mg total) by mouth 2 (two) times daily. Please fill on or after 09/16/11  60 tablet  0  . oxyCODONE (ROXICODONE) 15 MG immediate release tablet Take 1 tablet (15 mg total) by mouth 4 (four) times daily as needed. Fill on or after 09/16/11 .Marland KitchenMarland KitchenOk to fill 1-2 days early when month has 31 days  120 tablet  0  . prednisoLONE 5 MG TABS Take 2 tablets (10 mg total) by mouth daily.  100 each  5  . propranolol (INDERAL) 80 MG tablet Take 1 tablet (80 mg total) by mouth 3 (three) times daily.  270 tablet  1  . testosterone cypionate (DEPOTESTOTERONE CYPIONATE) 200 MG/ML injection Inject 3 mLs (600 mg total) into the muscle every 14 (fourteen) days.  20 mL  5  . torsemide (DEMADEX) 100 MG tablet Take 1 tablet (100 mg total) by mouth daily as needed.  30 tablet  11   Past Medical History  Diagnosis Date  . Diabetes mellitus     type II  .  Hypertension   . LBP (low back pain)   . Palpitations   . ED (erectile dysfunction)   . OSA on CPAP   . Hypogonadism male   . Vitamin d deficiency   . Osteoarthritis, hand   . Hyperlipidemia   . Atrial fibrillation   . Depression   . PTSD (post-traumatic stress disorder)   . Osteoarthritis    Past Surgical History  Procedure Date  . Back surgery 1987  . Cervical laminectomy 2004    [name]  . Appendectomy   . Cholecystectomy     reports that he quit smoking about 40 years ago. His smoking use included Cigarettes. He smoked 1 pack per day. He does not have any smokeless tobacco history on file. He reports that he does not drink alcohol or use illicit drugs. family history includes Allergies in his mother; Coronary artery disease in his other; Diabetes in his other; Heart attack in his father; Heart disease in his father; Hypertension in his other; Other in his paternal grandmother; Rheum arthritis in his paternal grandmother; and Stroke in his mother. Allergies    Allergen Reactions  . Amlodipine Besylate     REACTION: swelling  . Cymbalta (Duloxetine Hcl)     constipation  . Duloxetine     REACTION: constipation  . Temazepam      Review of Systems  Constitutional: Positive for fatigue and unexpected weight change. Negative for chills and appetite change.  HENT: Negative for nosebleeds, congestion, sore throat, sneezing, trouble swallowing and neck pain.   Eyes: Negative for itching and visual disturbance.  Respiratory: Negative for cough and shortness of breath.   Cardiovascular: Negative for chest pain, palpitations and leg swelling.  Gastrointestinal: Negative for nausea, diarrhea, blood in stool and abdominal distention.  Genitourinary: Negative for frequency and hematuria.  Musculoskeletal: Positive for back pain and arthralgias (better). Negative for joint swelling and gait problem.  Skin: Negative for rash.  Neurological: Negative for dizziness, tremors, speech difficulty, weakness, light-headedness and headaches.  Psychiatric/Behavioral: Positive for disturbed wake/sleep cycle and dysphoric mood. Negative for suicidal ideas and agitation. The patient is not nervous/anxious.        Objective:   Physical Exam  Constitutional: He is oriented to person, place, and time. He appears well-developed.       obese  HENT:  Mouth/Throat: Oropharynx is clear and moist.  Eyes: Conjunctivae are normal. Pupils are equal, round, and reactive to light.  Neck: Normal range of motion. No JVD present. No thyromegaly present.  Cardiovascular: Normal rate, regular rhythm, normal heart sounds and intact distal pulses.  Exam reveals no gallop and no friction rub.   No murmur heard. Pulmonary/Chest: Effort normal and breath sounds normal. No respiratory distress. He has no wheezes. He has no rales. He exhibits no tenderness.  Abdominal: Soft. Bowel sounds are normal. He exhibits no distension and no mass. There is no tenderness. There is no rebound and no  guarding.  Musculoskeletal: Normal range of motion. He exhibits tenderness (all spine). He exhibits no edema.       L anterior prox arm sq lump  Lymphadenopathy:    He has no cervical adenopathy.  Neurological: He is alert and oriented to person, place, and time. He has normal reflexes. No cranial nerve deficit. He exhibits normal muscle tone. Coordination normal.  Skin: Skin is warm and dry. No rash noted.  Psychiatric: His behavior is normal. Judgment and thought content normal.       Depressed, not suicidal or  homicidal     Lab Results  Component Value Date   WBC 9.1 08/13/2010   HGB 14.4 08/13/2010   HCT 42.1 08/13/2010   PLT 291.0 08/13/2010   GLUCOSE 79 01/28/2011   CHOL 128 12/09/2009   TRIG 134.0 12/09/2009   HDL 20.40* 12/09/2009   LDLCALC 81 12/09/2009   ALT 18 08/13/2010   AST 18 08/13/2010   NA 136 01/28/2011   K 4.6 01/28/2011   CL 102 01/28/2011   CREATININE 0.9 01/28/2011   BUN 18 01/28/2011   CO2 26 01/28/2011   TSH 2.66 06/08/2011   PSA 0.57 12/09/2009   HGBA1C 4.9 06/08/2011         Assessment & Plan:

## 2011-10-16 NOTE — Assessment & Plan Note (Signed)
7/13 - not better Declined injection

## 2011-10-16 NOTE — Assessment & Plan Note (Signed)
New CPAP machine

## 2011-10-17 ENCOUNTER — Telehealth: Payer: Self-pay | Admitting: Internal Medicine

## 2011-10-17 NOTE — Telephone Encounter (Signed)
Labs were ok Thx

## 2011-10-19 ENCOUNTER — Telehealth: Payer: Self-pay | Admitting: *Deleted

## 2011-10-19 DIAGNOSIS — G4733 Obstructive sleep apnea (adult) (pediatric): Secondary | ICD-10-CM

## 2011-10-19 NOTE — Telephone Encounter (Signed)
Recent OV note and demographics faxed to [facility]. They need a recent sleep study for pt to receive a new CPAP machine. Also, they need last OV amended  to say "why" he needs new machine. I reviewed chart and couldn't locate a recent sleep study. Last was 2002.  Please advise

## 2011-10-19 NOTE — Telephone Encounter (Signed)
I called pt and spoke to a male who states he is not available. Will try again later.

## 2011-10-19 NOTE — Telephone Encounter (Signed)
Do we need to send him for a Pulm cons to streamline the process? Thx

## 2011-10-20 NOTE — Telephone Encounter (Signed)
That or order the sleep study in EMR.

## 2011-10-20 NOTE — Telephone Encounter (Signed)
Pulm cons -ref Thx

## 2011-10-20 NOTE — Telephone Encounter (Signed)
Pt informed

## 2011-10-21 ENCOUNTER — Encounter: Payer: Self-pay | Admitting: Gastroenterology

## 2011-10-23 ENCOUNTER — Telehealth: Payer: Self-pay | Admitting: Internal Medicine

## 2011-10-23 ENCOUNTER — Encounter: Payer: Self-pay | Admitting: Endocrinology

## 2011-10-23 ENCOUNTER — Ambulatory Visit (INDEPENDENT_AMBULATORY_CARE_PROVIDER_SITE_OTHER): Payer: Medicare Other | Admitting: Endocrinology

## 2011-10-23 ENCOUNTER — Other Ambulatory Visit (INDEPENDENT_AMBULATORY_CARE_PROVIDER_SITE_OTHER): Payer: Medicare Other

## 2011-10-23 VITALS — BP 132/68 | HR 55 | Temp 98.2°F | Ht 72.0 in | Wt 237.0 lb

## 2011-10-23 DIAGNOSIS — R609 Edema, unspecified: Secondary | ICD-10-CM | POA: Insufficient documentation

## 2011-10-23 DIAGNOSIS — E871 Hypo-osmolality and hyponatremia: Secondary | ICD-10-CM

## 2011-10-23 DIAGNOSIS — I10 Essential (primary) hypertension: Secondary | ICD-10-CM

## 2011-10-23 LAB — BASIC METABOLIC PANEL
Calcium: 9.1 mg/dL (ref 8.4–10.5)
GFR: 79.23 mL/min (ref >60.00)
Potassium: 3.5 mEq/L (ref 3.5–5.1)
Sodium: 133 mEq/L — ABNORMAL LOW (ref 135–145)

## 2011-10-23 LAB — URINALYSIS, ROUTINE W REFLEX MICROSCOPIC
Hgb urine dipstick: NEGATIVE
Leukocytes, UA: NEGATIVE
Specific Gravity, Urine: <=1.005 (ref 1.000–1.030)
Urobilinogen, UA: 0.2 (ref 0.0–1.0)

## 2011-10-23 LAB — BRAIN NATRIURETIC PEPTIDE: Pro B Natriuretic peptide (BNP): 53 pg/mL (ref 0.0–100.0)

## 2011-10-23 NOTE — Progress Notes (Signed)
Subjective:    Patient ID: Vernon Bishop, male    DOB: 12-06-1940, 71 y.o.   MRN: [id number]  HPI Pt states few days of moderate swelling of the legs, but no assoc pain.  He takes demadex only prn.   Past Medical History  Diagnosis Date  . Diabetes mellitus     type II  . Hypertension   . LBP (low back pain)   . Palpitations   . ED (erectile dysfunction)   . OSA on CPAP   . Hypogonadism male   . Vitamin d deficiency   . Osteoarthritis, hand   . Hyperlipidemia   . Atrial fibrillation   . Depression   . PTSD (post-traumatic stress disorder)   . Osteoarthritis     Past Surgical History  Procedure Date  . Back surgery 1987  . Cervical laminectomy 2004    [name]  . Appendectomy   . Cholecystectomy     History   Social History  . Marital Status: Married    Spouse Name: N/A    Number of Children: 4  . Years of Education: N/A   Occupational History  . Retired     Industrial/product designer   Social History Main Topics  . Smoking status: Former Smoker -- 1.0 packs/day    Types: Cigarettes    Quit date: 03/31/1971  . Smokeless tobacco: Not on file   Comment: Started at age 12  . Alcohol Use: No  . Drug Use: No  . Sexually Active: Not on file   Other Topics Concern  . Not on file   Social History Narrative   FAMILY HISTORYHistory of CAD Male 1st degree relative <50History Diabetes 1st degree relativeHistory hypertension    Current Outpatient Prescriptions on File Prior to Visit  Medication Sig Dispense Refill  . amLODipine (NORVASC) 10 MG tablet Take 1 tablet (10 mg total) by mouth daily.  90 tablet  2  . aspirin 81 MG EC tablet Take 81 mg by mouth daily.        . benazepril-hydrochlorthiazide (LOTENSIN HCT) 20-12.5 MG per tablet Take 2 tablets by mouth daily.  60 tablet  11  . buPROPion (WELLBUTRIN SR) 150 MG 12 hr tablet Take 1 tablet (150 mg total) by mouth 2 (two) times daily.  60 tablet  5  . Cholecalciferol (VITAMIN D3) 1000 UNIT tablet Take 1,000 Units by mouth  daily.        . clonazePAM (KLONOPIN) 1 MG tablet 1/2 tab at bedtime as needed for sleep      . clotrimazole-betamethasone (LOTRISONE) cream Apply topically 2 (two) times daily. X 1 month as needed  30 g  3  . Cyanocobalamin (VITAMIN B-12 CR) 1000 MCG TBCR Take 1,000 mcg by mouth daily.        . cyclobenzaprine (FLEXERIL) 10 MG tablet TAKE 1 TABLET BY MOUTH 3 TIMES A DAY  90 tablet  3  . digoxin (LANOXIN) 0.25 MG tablet Take 1.5 tablets (375 mcg total) by mouth daily.  135 tablet  3  . FLUoxetine (PROZAC) 40 MG capsule Take 1 capsule (40 mg total) by mouth daily.  90 capsule  2  . gabapentin (NEURONTIN) 300 MG capsule Take 1 capsule (300 mg total) by mouth 3 (three) times daily.  90 capsule  3  . morphine (MS CONTIN) 30 MG 12 hr tablet Take 1 tablet (30 mg total) by mouth 2 (two) times daily. Please fill on or after 11/16/11  60 tablet  0  . oxyCODONE (  ROXICODONE) 15 MG immediate release tablet Take 1 tablet (15 mg total) by mouth 4 (four) times daily as needed. Fill on or after 11/16/11 .Marland KitchenMarland KitchenOk to fill 1-2 days early when month has 31 days  120 tablet  0  . propranolol (INDERAL) 80 MG tablet Take 1 tablet (80 mg total) by mouth 3 (three) times daily.  270 tablet  1  . Syringe/Needle, Disp, (SYRINGE 3CC/22GX1") 22G X 1" 3 ML MISC 1 each by Does not apply route every 14 (fourteen) days.  50 each  2  . testosterone cypionate (DEPOTESTOTERONE CYPIONATE) 200 MG/ML injection Inject 3 mLs (600 mg total) into the muscle every 14 (fourteen) days.  20 mL  5  . torsemide (DEMADEX) 100 MG tablet Take 1 tablet (100 mg total) by mouth daily as needed.  30 tablet  11    Allergies  Allergen Reactions  . Amlodipine Besylate     REACTION: swelling  . Cymbalta (Duloxetine Hcl)     constipation  . Duloxetine     REACTION: constipation  . Temazepam     Family History  Problem Relation Age of Onset  . Allergies Mother   . Stroke Mother     Clotting disorders  . Heart disease Father   . Heart attack Father     . Rheum arthritis Paternal Grandmother   . Other Paternal Grandmother     Rheumatism  . Coronary artery disease Other   . Diabetes Other   . Hypertension Other     BP 132/68  Pulse 55  Temp 98.2 F (36.8 C) (Oral)  Ht 6' (1.829 m)  Wt 237 lb (107.502 kg)  BMI 32.14 kg/m2  SpO2 93%    Review of Systems Denies sob.  Wife says pt is very drowsy during the day, and frequently falls asleep during the day.      Objective:   Physical Exam VITAL SIGNS:  See vs page GENERAL: no distress LUNGS:  Clear to auscultation Ext: 2+ bilat leg edema    Lab Results  Component Value Date   WBC 9.1 08/13/2010   HGB 14.4 08/13/2010   HCT 42.1 08/13/2010   PLT 291.0 08/13/2010   GLUCOSE 98 10/23/2011   CHOL 128 12/09/2009   TRIG 134.0 12/09/2009   HDL 20.40* 12/09/2009   LDLCALC 81 12/09/2009   ALT 18 08/13/2010   AST 18 08/13/2010   NA 133* 10/23/2011   K 3.5 10/23/2011   CL 93* 10/23/2011   CREATININE 1.0 10/23/2011   BUN 11 10/23/2011   CO2 30 10/23/2011   TSH 2.66 06/08/2011   PSA 0.57 12/09/2009   HGBA1C 5.3 10/16/2011   BNP is normal     Assessment & Plan:  Hyponatremia, improved Edema of legs.  She does not have generalized fluid overload Hypersomnolence, prob due to meds

## 2011-10-23 NOTE — Patient Instructions (Addendum)
blood tests are being requested for you today.  You will receive a letter with results. Reduce clonazepan to 1/2 pill at bedtime as needed for sleep.  I hope you feel better soon.  If you don't feel better by next week, please call dr plotnikov.

## 2011-10-23 NOTE — Telephone Encounter (Signed)
Caller: Pat/Spouse; PCP: [name], Alex; CB#: [phone]; Call regarding recent confusion for a  few days, onset 10/20/11, also ankles and knees swollen, able to walk, no recent change in medication, not aggressive Confusion Protocol Utilized.   Spouse states that she would rather be seen in office in possible versus being seen at ER.  Appt scheduled 10/23/11 0915.

## 2011-10-23 NOTE — Telephone Encounter (Signed)
Addendum:  Cut back on Gabapenin as well Thx

## 2011-10-23 NOTE — Telephone Encounter (Signed)
It is likely pain meds related Try to cut back on Oxycodone - try tid or bid in place of qid OK to work in ER or UC if cont to have this problem to r/o infection, etc. Thx

## 2011-10-29 ENCOUNTER — Telehealth: Payer: Self-pay

## 2011-10-29 NOTE — Telephone Encounter (Signed)
Pt informed

## 2011-10-29 NOTE — Telephone Encounter (Signed)
Pt called stating that he is still having LE swelling and is concerned about this. Pt is very difficult to understand but continuously requests advisement from PCP, please advise.

## 2011-10-29 NOTE — Telephone Encounter (Signed)
Reduce amlodipine to 1/2 tab a day - the swelling should improve in cseveral days RTC in 3-4 wks Thx

## 2011-11-09 ENCOUNTER — Encounter: Payer: Self-pay | Admitting: Internal Medicine

## 2011-11-09 ENCOUNTER — Ambulatory Visit (INDEPENDENT_AMBULATORY_CARE_PROVIDER_SITE_OTHER): Payer: Medicare Other | Admitting: Internal Medicine

## 2011-11-09 VITALS — BP 122/92 | HR 76 | Temp 97.3°F | Ht 72.0 in | Wt 224.0 lb

## 2011-11-09 DIAGNOSIS — Z1211 Encounter for screening for malignant neoplasm of colon: Secondary | ICD-10-CM

## 2011-11-09 DIAGNOSIS — I10 Essential (primary) hypertension: Secondary | ICD-10-CM

## 2011-11-09 DIAGNOSIS — E119 Type 2 diabetes mellitus without complications: Secondary | ICD-10-CM

## 2011-11-09 DIAGNOSIS — F329 Major depressive disorder, single episode, unspecified: Secondary | ICD-10-CM

## 2011-11-09 DIAGNOSIS — R609 Edema, unspecified: Secondary | ICD-10-CM

## 2011-11-09 DIAGNOSIS — E538 Deficiency of other specified B group vitamins: Secondary | ICD-10-CM

## 2011-11-09 NOTE — Assessment & Plan Note (Signed)
Continue with current prescription therapy as reflected on the Med list.  

## 2011-11-09 NOTE — Assessment & Plan Note (Signed)
  On diet  

## 2011-11-09 NOTE — Assessment & Plan Note (Signed)
Better, resolved

## 2011-11-09 NOTE — Assessment & Plan Note (Signed)
Better  

## 2011-11-09 NOTE — Progress Notes (Signed)
Subjective:    Patient ID: Vernon Bishop, male    DOB: 01-12-1941, 71 y.o.   MRN: [id number]  HPI  F/u swelling - better; somnolence and low Na - better  The patient is here to follow up on chronic depression, anxiety, headaches and chronic moderate-severe LBP and PMR symptoms. Pain is better controlled with medicines well.  He was not better on Prednisone and he stopped it. Needs a new CPAP machine F/u on low Na  Wt Readings from Last 3 Encounters:  11/09/11 224 lb (101.606 kg)  10/23/11 237 lb (107.502 kg)  10/16/11 243 lb (110.224 kg)   BP Readings from Last 3 Encounters:  11/09/11 122/92  10/23/11 132/68  10/16/11 128/70   BP 122/92  Pulse 76  Temp 97.3 F (36.3 C) (Oral)  Ht 6' (1.829 m)  Wt 224 lb (101.606 kg)  BMI 30.38 kg/m2  SpO2 97%  Current Outpatient Prescriptions on File Prior to Visit  Medication Sig Dispense Refill  . amLODipine (NORVASC) 10 MG tablet Take 1 tablet (10 mg total) by mouth daily.  90 tablet  2  . aspirin 81 MG EC tablet Take 81 mg by mouth daily.        . benazepril-hydrochlorthiazide (LOTENSIN HCT) 20-12.5 MG per tablet Take 2 tablets by mouth daily.  60 tablet  11  . buPROPion (WELLBUTRIN SR) 150 MG 12 hr tablet Take 1 tablet (150 mg total) by mouth 2 (two) times daily.  60 tablet  5  . Cholecalciferol (VITAMIN D3) 1000 UNIT tablet Take 1,000 Units by mouth daily.        . clonazePAM (KLONOPIN) 1 MG tablet 1/2 tab at bedtime as needed for sleep      . clotrimazole-betamethasone (LOTRISONE) cream Apply topically 2 (two) times daily. X 1 month as needed  30 g  3  . Cyanocobalamin (VITAMIN B-12 CR) 1000 MCG TBCR Take 1,000 mcg by mouth daily.        . cyclobenzaprine (FLEXERIL) 10 MG tablet TAKE 1 TABLET BY MOUTH 3 TIMES A DAY  90 tablet  3  . digoxin (LANOXIN) 0.25 MG tablet Take 1.5 tablets (375 mcg total) by mouth daily.  135 tablet  3  . FLUoxetine (PROZAC) 40 MG capsule Take 1 capsule (40 mg total) by mouth daily.  90 capsule  2  .  gabapentin (NEURONTIN) 300 MG capsule Take 1 capsule (300 mg total) by mouth 3 (three) times daily.  90 capsule  3  . morphine (MS CONTIN) 30 MG 12 hr tablet Take 1 tablet (30 mg total) by mouth 2 (two) times daily. Please fill on or after 11/16/11  60 tablet  0  . oxyCODONE (ROXICODONE) 15 MG immediate release tablet Take 1 tablet (15 mg total) by mouth 4 (four) times daily as needed. Fill on or after 11/16/11 .Marland KitchenMarland KitchenOk to fill 1-2 days early when month has 31 days  120 tablet  0  . propranolol (INDERAL) 80 MG tablet Take 1 tablet (80 mg total) by mouth 3 (three) times daily.  270 tablet  1  . Syringe/Needle, Disp, (SYRINGE 3CC/22GX1") 22G X 1" 3 ML MISC 1 each by Does not apply route every 14 (fourteen) days.  50 each  2  . testosterone cypionate (DEPOTESTOTERONE CYPIONATE) 200 MG/ML injection Inject 3 mLs (600 mg total) into the muscle every 14 (fourteen) days.  20 mL  5  . torsemide (DEMADEX) 100 MG tablet Take 1 tablet (100 mg total) by mouth daily as needed.  30 tablet  11   Past Medical History  Diagnosis Date  . Diabetes mellitus     type II  . Hypertension   . LBP (low back pain)   . Palpitations   . ED (erectile dysfunction)   . OSA on CPAP   . Hypogonadism male   . Vitamin d deficiency   . Osteoarthritis, hand   . Hyperlipidemia   . Atrial fibrillation   . Depression   . PTSD (post-traumatic stress disorder)   . Osteoarthritis    Past Surgical History  Procedure Date  . Back surgery 1987  . Cervical laminectomy 2004    [name]  . Appendectomy   . Cholecystectomy     reports that he quit smoking about 40 years ago. His smoking use included Cigarettes. He smoked 1 pack per day. He does not have any smokeless tobacco history on file. He reports that he does not drink alcohol or use illicit drugs. family history includes Allergies in his mother; Coronary artery disease in his other; Diabetes in his other; Heart attack in his father; Heart disease in his father; Hypertension in his  other; Other in his paternal grandmother; Rheum arthritis in his paternal grandmother; and Stroke in his mother. Allergies  Allergen Reactions  . Amlodipine Besylate     REACTION: swelling  . Cymbalta (Duloxetine Hcl)     constipation  . Duloxetine     REACTION: constipation  . Temazepam      Review of Systems  Constitutional: Positive for fatigue and unexpected weight change. Negative for chills and appetite change.  HENT: Negative for nosebleeds, congestion, sore throat, sneezing, trouble swallowing and neck pain.   Eyes: Negative for itching and visual disturbance.  Respiratory: Negative for cough and shortness of breath.   Cardiovascular: Negative for chest pain, palpitations and leg swelling.  Gastrointestinal: Negative for nausea, diarrhea, blood in stool and abdominal distention.  Genitourinary: Negative for frequency and hematuria.  Musculoskeletal: Positive for back pain and arthralgias (better). Negative for joint swelling and gait problem.  Skin: Negative for rash.  Neurological: Negative for dizziness, tremors, speech difficulty, weakness, light-headedness and headaches.  Psychiatric/Behavioral: Positive for disturbed wake/sleep cycle and dysphoric mood. Negative for suicidal ideas and agitation. The patient is not nervous/anxious.        Objective:   Physical Exam  Constitutional: He is oriented to person, place, and time. He appears well-developed.       obese  HENT:  Mouth/Throat: Oropharynx is clear and moist.  Eyes: Conjunctivae are normal. Pupils are equal, round, and reactive to light.  Neck: Normal range of motion. No JVD present. No thyromegaly present.  Cardiovascular: Normal rate, regular rhythm, normal heart sounds and intact distal pulses.  Exam reveals no gallop and no friction rub.   No murmur heard. Pulmonary/Chest: Effort normal and breath sounds normal. No respiratory distress. He has no wheezes. He has no rales. He exhibits no tenderness.    Abdominal: Soft. Bowel sounds are normal. He exhibits no distension and no mass. There is no tenderness. There is no rebound and no guarding.  Musculoskeletal: Normal range of motion. He exhibits tenderness (all spine). He exhibits no edema.       L anterior prox arm sq lump  Lymphadenopathy:    He has no cervical adenopathy.  Neurological: He is alert and oriented to person, place, and time. He has normal reflexes. No cranial nerve deficit. He exhibits normal muscle tone. Coordination normal.  Skin: Skin is warm and dry.  No rash noted.  Psychiatric: His behavior is normal. Judgment and thought content normal.       Not depressed, not suicidal or homicidal     Lab Results  Component Value Date   WBC 9.1 08/13/2010   HGB 14.4 08/13/2010   HCT 42.1 08/13/2010   PLT 291.0 08/13/2010   GLUCOSE 98 10/23/2011   CHOL 128 12/09/2009   TRIG 134.0 12/09/2009   HDL 20.40* 12/09/2009   LDLCALC 81 12/09/2009   ALT 18 08/13/2010   AST 18 08/13/2010   NA 133* 10/23/2011   K 3.5 10/23/2011   CL 93* 10/23/2011   CREATININE 1.0 10/23/2011   BUN 11 10/23/2011   CO2 30 10/23/2011   TSH 2.66 06/08/2011   PSA 0.57 12/09/2009   HGBA1C 5.3 10/16/2011         Assessment & Plan:

## 2011-11-19 ENCOUNTER — Other Ambulatory Visit: Payer: Self-pay | Admitting: *Deleted

## 2011-11-19 MED ORDER — DIGOXIN 250 MCG PO TABS: 375.0000 ug | ORAL_TABLET | Freq: Every day | ORAL | Status: DC

## 2011-11-20 ENCOUNTER — Institutional Professional Consult (permissible substitution): Payer: Medicare Other | Admitting: Pulmonary Disease

## 2011-11-27 ENCOUNTER — Encounter: Payer: Self-pay | Admitting: Pulmonary Disease

## 2011-11-27 ENCOUNTER — Ambulatory Visit (INDEPENDENT_AMBULATORY_CARE_PROVIDER_SITE_OTHER): Payer: Medicare Other | Admitting: Pulmonary Disease

## 2011-11-27 VITALS — BP 180/82 | HR 58 | Temp 98.2°F | Ht 72.0 in | Wt 226.6 lb

## 2011-11-27 DIAGNOSIS — G4733 Obstructive sleep apnea (adult) (pediatric): Secondary | ICD-10-CM

## 2011-11-27 NOTE — Progress Notes (Signed)
  Subjective:    Patient ID: Vernon Bishop, male    DOB: 28-Oct-1940, 71 y.o.   MRN: [id number]  HPI The patient is a 71 year old male who been asked to see for management of obstructive sleep apnea.  He was initially diagnosed with severe sleep apnea in 2002, and was last seen approximately 3 years ago.  He has the same machine from his original diagnosis, and now it is starting to have issues mechanically and also with fluctuating pressures.  The patient was doing extremely well with a nasal CPAP mask prior to the machine malfunctioning.  He has been having issues over the last one year, and now the patient is only able to wear it intermittently.  He is not sleeping as well, and has increased daytime sleepiness.  His Epworth score today is 14.  The patient's weight is down 11 pounds from 3 years ago.  Sleep Questionnaire: What time do you typically go to bed?( Between what hours) 10-11pm How long does it take you to fall asleep? 30 mins How many times during the night do you wake up? 4 What time do you get out of bed to start your day? 0800 Do you drive or operate heavy machinery in your occupation? No How much has your weight changed (up or down) over the past two years? (In pounds) 20 lb (9.072 kg) Have you ever had a sleep study before? Yes If yes, location of study? [facility] If yes, date of study? Do you currently use CPAP? Yes If so, what pressure? unknown Do you wear oxygen at any time? No     Review of Systems  Constitutional: Negative for fever and unexpected weight change.  HENT: Negative for ear pain, nosebleeds, congestion, sore throat, rhinorrhea, sneezing, trouble swallowing, dental problem, postnasal drip and sinus pressure.   Eyes: Negative for redness and itching.  Respiratory: Negative for cough, chest tightness, shortness of breath and wheezing.   Cardiovascular: Negative for palpitations and leg swelling.  Gastrointestinal: Negative for nausea and vomiting.  Genitourinary:  Negative for dysuria.  Musculoskeletal: Negative for joint swelling.  Skin: Negative for rash.  Neurological: Negative for headaches.  Hematological: Does not bruise/bleed easily.  Psychiatric/Behavioral: Negative for dysphoric mood. The patient is not nervous/anxious.        Objective:   Physical Exam Constitutional:  Overweight male, no acute distress  HENT:  Nares patent without discharge  Oropharynx without exudate, palate and uvula are very thick and long  Eyes:  Perrla, eomi, no scleral icterus  Neck:  No JVD, no TMG  Cardiovascular:  Normal rate, regular rhythm, no rubs or gallops.  No murmurs        Intact distal pulses  Pulmonary :  Normal breath sounds, no stridor or respiratory distress   No rales, rhonchi, or wheezing  Abdominal:  Soft, nondistended, bowel sounds present.  No tenderness noted.   Musculoskeletal:  No lower extremity edema noted.  Lymph Nodes:  No cervical lymphadenopathy noted  Skin:  No cyanosis noted  Neurologic:  Alert, appropriate, moves all 4 extremities without obvious deficit.         Assessment & Plan:

## 2011-11-27 NOTE — Patient Instructions (Addendum)
Will get you a new cpap machine, and set on the auto mode for the first 2-3 weeks to optimize your pressure.  Will let you know the results once available. Continue to work on weight loss followup with me in one year if doing well.

## 2011-11-27 NOTE — Assessment & Plan Note (Signed)
The patient has a history of very severe obstructive sleep apnea, and has his original machine from 2002.  He is obviously overdue for a new device.  His current machine is having mechanical issues, and is ramping up the pressure on his own.  I will refer the patient back to his D&E for a new device, and will take this opportunity to optimize his pressure again.  I have encouraged the patient to continue working on weight loss. Care Plan:  At this point, will arrange for the patient's machine to be changed over to auto mode for 2 weeks to optimize their pressure.  I will review the downloaded data once sent by dme, and also evaluate for compliance, leaks, and residual osa.  I will call the patient and dme to discuss the results, and have the patient's machine set appropriately.  This will serve as the pt's cpap pressure titration.

## 2011-12-02 ENCOUNTER — Telehealth: Payer: Self-pay | Admitting: Pulmonary Disease

## 2011-12-02 NOTE — Telephone Encounter (Signed)
lmomtcb x1 for Vernon Bishop 

## 2011-12-03 NOTE — Telephone Encounter (Signed)
KC pt will have to repeat npsg due to break in therapy need and order for npsg Tobe Sos

## 2011-12-03 NOTE — Telephone Encounter (Signed)
Per TD I will forward to PCC. Jennifer Castillo, CMA  

## 2011-12-03 NOTE — Telephone Encounter (Signed)
He has a break in therapy because his machine is old as dirt and is malfunctioning.  He has not been able to wear because of this.   We can always send him somewhere else.

## 2011-12-04 NOTE — Telephone Encounter (Signed)
ahc has this issue resolved pt should be ok to get cpap Tobe Sos

## 2011-12-08 ENCOUNTER — Ambulatory Visit (AMBULATORY_SURGERY_CENTER): Payer: Medicare Other

## 2011-12-08 VITALS — Ht 72.0 in | Wt 223.6 lb

## 2011-12-08 DIAGNOSIS — Z1211 Encounter for screening for malignant neoplasm of colon: Secondary | ICD-10-CM

## 2011-12-08 MED ORDER — NA SULFATE-K SULFATE-MG SULF 17.5-3.13-1.6 GM/177ML PO SOLN: 1.0000 | Freq: Once | ORAL | Status: DC

## 2011-12-09 ENCOUNTER — Encounter: Payer: Self-pay | Admitting: Gastroenterology

## 2011-12-15 ENCOUNTER — Telehealth: Payer: Self-pay | Admitting: *Deleted

## 2011-12-15 DIAGNOSIS — R202 Paresthesia of skin: Secondary | ICD-10-CM

## 2011-12-15 DIAGNOSIS — M545 Low back pain: Secondary | ICD-10-CM

## 2011-12-15 DIAGNOSIS — E119 Type 2 diabetes mellitus without complications: Secondary | ICD-10-CM

## 2011-12-15 DIAGNOSIS — M542 Cervicalgia: Secondary | ICD-10-CM

## 2011-12-15 DIAGNOSIS — I10 Essential (primary) hypertension: Secondary | ICD-10-CM

## 2011-12-15 DIAGNOSIS — E785 Hyperlipidemia, unspecified: Secondary | ICD-10-CM

## 2011-12-15 DIAGNOSIS — M2550 Pain in unspecified joint: Secondary | ICD-10-CM

## 2011-12-15 NOTE — Telephone Encounter (Signed)
Rf req for Oxycodone 15 mg 1 po qid prn. # 120. Last fil 11/14/11. Ok to Rf?

## 2011-12-15 NOTE — Telephone Encounter (Signed)
OK to fill this prescription with additional refills x0 Thank you!  

## 2011-12-16 MED ORDER — OXYCODONE HCL 15 MG PO TABS: 15.0000 mg | ORAL_TABLET | Freq: Four times a day (QID) | ORAL | Status: DC | PRN

## 2011-12-16 NOTE — Telephone Encounter (Signed)
Called pt x 3// No answer or VM, closing phone note until pt calls back.

## 2011-12-16 NOTE — Telephone Encounter (Signed)
Rx printed. Pending MD signature. Tried to call pt to inform Rx ready. No answer/machine

## 2011-12-16 NOTE — Telephone Encounter (Signed)
Pt's son called around 2:40 and left message on triage stating that he would pickup rx if ready, called son back and left message on VM informing him rx ready for pickup.

## 2011-12-17 ENCOUNTER — Encounter: Payer: Self-pay | Admitting: Gastroenterology

## 2011-12-17 ENCOUNTER — Ambulatory Visit (AMBULATORY_SURGERY_CENTER): Payer: Medicare Other | Admitting: Gastroenterology

## 2011-12-17 VITALS — BP 148/74 | HR 49 | Temp 97.4°F | Resp 16 | Ht 72.0 in | Wt 223.0 lb

## 2011-12-17 DIAGNOSIS — K5730 Diverticulosis of large intestine without perforation or abscess without bleeding: Secondary | ICD-10-CM

## 2011-12-17 DIAGNOSIS — Z1211 Encounter for screening for malignant neoplasm of colon: Secondary | ICD-10-CM

## 2011-12-17 MED ORDER — SODIUM CHLORIDE 0.9 % IV SOLN: 500.0000 mL | INTRAVENOUS | Status: DC

## 2011-12-17 NOTE — Patient Instructions (Addendum)
YOU HAD AN ENDOSCOPIC PROCEDURE TODAY AT THE [facility]: Refer to the procedure report that was given to you for any specific questions about what was found during the examination.  If the procedure report does not answer your questions, please call your gastroenterologist to clarify.  If you requested that your care partner not be given the details of your procedure findings, then the procedure report has been included in a sealed envelope for you to review at your convenience later.  YOU SHOULD EXPECT: Some feelings of bloating in the abdomen. Passage of more gas than usual.  Walking can help get rid of the air that was put into your GI tract during the procedure and reduce the bloating. If you had a lower endoscopy (such as a colonoscopy or flexible sigmoidoscopy) you may notice spotting of blood in your stool or on the toilet paper. If you underwent a bowel prep for your procedure, then you may not have a normal bowel movement for a few days.  DIET: Your first meal following the procedure should be a light meal and then it is ok to progress to your normal diet.  A half-sandwich or bowl of soup is an example of a good first meal.  Heavy or fried foods are harder to digest and may make you feel nauseous or bloated.  Likewise meals heavy in dairy and vegetables can cause extra gas to form and this can also increase the bloating.  Drink plenty of fluids but you should avoid alcoholic beverages for 24 hours.  ACTIVITY: Your care partner should take you home directly after the procedure.  You should plan to take it easy, moving slowly for the rest of the day.  You can resume normal activity the day after the procedure however you should NOT DRIVE or use heavy machinery for 24 hours (because of the sedation medicines used during the test).    SYMPTOMS TO REPORT IMMEDIATELY: A gastroenterologist can be reached at any hour.  During normal business hours, 8:30 AM to 5:00 PM Monday through Friday,  call [phone].  After hours and on weekends, please call the GI answering service at [phone] who will take a message and have the physician on call contact you.   Following lower endoscopy (colonoscopy or flexible sigmoidoscopy):  Excessive amounts of blood in the stool  Significant tenderness or worsening of abdominal pains  Swelling of the abdomen that is new, acute  Fever of 100F or higher  Following upper endoscopy (EGD)  Vomiting of blood or coffee ground material  New chest pain or pain under the shoulder blades  Painful or persistently difficult swallowing  New shortness of breath  Fever of 100F or higher  Black, tarry-looking stools  FOLLOW UP: If any biopsies were taken you will be contacted by phone or by letter within the next 1-3 weeks.  Call your gastroenterologist if you have not heard about the biopsies in 3 weeks.  Our staff will call the home number listed on your records the next business day following your procedure to check on you and address any questions or concerns that you may have at that time regarding the information given to you following your procedure. This is a courtesy call and so if there is no answer at the home number and we have not heard from you through the emergency physician on call, we will assume that you have returned to your regular daily activities without incident.  SIGNATURES/CONFIDENTIALITY: You and/or your care   partner have signed paperwork which will be entered into your electronic medical record.  These signatures attest to the fact that that the information above on your After Visit Summary has been reviewed and is understood.  Full responsibility of the confidentiality of this discharge information lies with you and/or your care-partner.  

## 2011-12-17 NOTE — Progress Notes (Signed)
Patient did not experience any of the following events: a burn prior to discharge; a fall within the facility; wrong site/side/patient/procedure/implant event; or a hospital transfer or hospital admission upon discharge from the facility. (G8907) Patient did not have preoperative order for IV antibiotic SSI prophylaxis. (G8918)  

## 2011-12-17 NOTE — Op Note (Signed)
[facility] [address] [place], [address]   COLONOSCOPY PROCEDURE REPORT  PATIENT: Vernon Bishop, Vernon Bishop  MR#: [id number] BIRTHDATE: 1941/03/14 , 71  yrs. old GENDER: Male ENDOSCOPIST: Louis Meckel, MD REFERRED BY: PROCEDURE DATE:  12/17/2011 PROCEDURE:   Colonoscopy, diagnostic ASA CLASS: INDICATIONS:average risk screening. MEDICATIONS: MAC sedation, administered by CRNA and propofol (Diprivan) 200mg  IV  DESCRIPTION OF PROCEDURE:   After the risks benefits and alternatives of the procedure were thoroughly explained, informed consent was obtained.  A digital rectal exam revealed no abnormalities of the rectum.   The LB [id number]  endoscope was introduced through the anus and advanced to the cecum, which was identified by both the appendix and ileocecal valve. No adverse events experienced.   The quality of the prep was Suprep good  The instrument was then slowly withdrawn as the colon was fully examined.      COLON FINDINGS: Mild diverticulosis was noted in the sigmoid colon. Small nodule was found in the rectum just below the dentate line. The colon mucosa was otherwise normal.  Retroflexed views revealed no abnormalities. The time to cecum=3 minutes 35 seconds. Withdrawal time=6 minutes 02 seconds.  The scope was withdrawn and the procedure completed. COMPLICATIONS: There were no complications.  ENDOSCOPIC IMPRESSION: 1.   Mild diverticulosis was noted in the sigmoid colo 2.   The colon mucosa was otherwise normal  RECOMMENDATIONS: Continue current colorectal screening recommendations for "routine risk" patients with a repeat colonoscopy in 10 years.   eSigned:  Louis Meckel, MD 12/17/2011 2:20 PM   cc: Linda Hedges.  [name], MD and [name] MD

## 2011-12-18 ENCOUNTER — Other Ambulatory Visit: Payer: Self-pay

## 2011-12-18 ENCOUNTER — Encounter: Payer: Self-pay | Admitting: Internal Medicine

## 2011-12-18 ENCOUNTER — Ambulatory Visit (INDEPENDENT_AMBULATORY_CARE_PROVIDER_SITE_OTHER): Payer: Medicare Other | Admitting: Internal Medicine

## 2011-12-18 ENCOUNTER — Telehealth: Payer: Self-pay | Admitting: *Deleted

## 2011-12-18 ENCOUNTER — Ambulatory Visit (INDEPENDENT_AMBULATORY_CARE_PROVIDER_SITE_OTHER)
Admission: RE | Admit: 2011-12-18 | Discharge: 2011-12-18 | Disposition: A | Discharge status: Home / Self Care | Payer: Medicare Other | Source: Ambulatory Visit | Attending: Internal Medicine | Admitting: Internal Medicine

## 2011-12-18 VITALS — BP 164/80 | HR 80 | Temp 98.5°F | Resp 16 | Wt 227.0 lb

## 2011-12-18 DIAGNOSIS — M542 Cervicalgia: Secondary | ICD-10-CM

## 2011-12-18 DIAGNOSIS — M25551 Pain in right hip: Secondary | ICD-10-CM

## 2011-12-18 DIAGNOSIS — I10 Essential (primary) hypertension: Secondary | ICD-10-CM

## 2011-12-18 DIAGNOSIS — E785 Hyperlipidemia, unspecified: Secondary | ICD-10-CM

## 2011-12-18 DIAGNOSIS — M545 Low back pain: Secondary | ICD-10-CM

## 2011-12-18 DIAGNOSIS — M25559 Pain in unspecified hip: Secondary | ICD-10-CM

## 2011-12-18 DIAGNOSIS — M2550 Pain in unspecified joint: Secondary | ICD-10-CM

## 2011-12-18 DIAGNOSIS — E119 Type 2 diabetes mellitus without complications: Secondary | ICD-10-CM

## 2011-12-18 DIAGNOSIS — Z23 Encounter for immunization: Secondary | ICD-10-CM

## 2011-12-18 DIAGNOSIS — R202 Paresthesia of skin: Secondary | ICD-10-CM

## 2011-12-18 DIAGNOSIS — R209 Unspecified disturbances of skin sensation: Secondary | ICD-10-CM

## 2011-12-18 MED ORDER — MORPHINE SULFATE ER 30 MG PO TBCR: 30.0000 mg | EXTENDED_RELEASE_TABLET | Freq: Two times a day (BID) | ORAL | Status: DC

## 2011-12-18 MED ORDER — OXYCODONE HCL 15 MG PO TABS: 15.0000 mg | ORAL_TABLET | Freq: Four times a day (QID) | ORAL | Status: DC | PRN

## 2011-12-18 MED ORDER — "SYRINGE 22G X 1"" 3 ML MISC": 1.0000 | Status: DC

## 2011-12-18 NOTE — Progress Notes (Signed)
Subjective:    Patient ID: Vernon Bishop, male    DOB: 13-Oct-1940, 71 y.o.   MRN: [id number]  HPI  F/u swelling - resolved. C/o R hip pain - long time - worse now.  The patient is here to follow up on chronic depression, anxiety, headaches and chronic moderate-severe LBP and PMR symptoms. Pain is better controlled with medicines well.  He was not better on Prednisone and he stopped it. Needs a new CPAP machine F/u on low Na  Wt Readings from Last 3 Encounters:  12/18/11 227 lb (102.967 kg)  12/17/11 223 lb (101.152 kg)  12/08/11 223 lb 9.6 oz (101.424 kg)   BP Readings from Last 3 Encounters:  12/18/11 164/80  12/17/11 148/74  11/27/11 180/82   BP 164/80  Pulse 80  Temp 98.5 F (36.9 C) (Oral)  Resp 16  Wt 227 lb (102.967 kg)  Current Outpatient Prescriptions on File Prior to Visit  Medication Sig Dispense Refill  . aspirin 81 MG EC tablet Take 81 mg by mouth daily.        . benazepril-hydrochlorthiazide (LOTENSIN HCT) 20-12.5 MG per tablet Take 2 tablets by mouth daily.  60 tablet  11  . buPROPion (WELLBUTRIN SR) 150 MG 12 hr tablet Take 150 mg by mouth daily.      . Cholecalciferol (VITAMIN D3) 1000 UNIT tablet Take 1,000 Units by mouth daily.        . clonazePAM (KLONOPIN) 1 MG tablet 1/2 tab at bedtime as needed for sleep       . clotrimazole-betamethasone (LOTRISONE) cream Apply topically 2 (two) times daily. X 1 month as needed  30 g  3  . Cyanocobalamin (VITAMIN B-12 CR) 1000 MCG TBCR Take 1,000 mcg by mouth daily.        . cyclobenzaprine (FLEXERIL) 10 MG tablet as needed.       . digoxin (LANOXIN) 0.25 MG tablet Take 0.25 mg by mouth daily.      Marland Kitchen FLUoxetine (PROZAC) 40 MG capsule Take 1 capsule (40 mg total) by mouth daily.  90 capsule  2  . gabapentin (NEURONTIN) 300 MG capsule Take 1 capsule (300 mg total) by mouth 3 (three) times daily.  90 capsule  3  . morphine (MS CONTIN) 30 MG 12 hr tablet Take 1 tablet (30 mg total) by mouth 2 (two) times daily. Please  fill on or after 11/16/11  60 tablet  0  . oxyCODONE (ROXICODONE) 15 MG immediate release tablet Take 1 tablet (15 mg total) by mouth 4 (four) times daily as needed. Fill on or after 12/15/11 .Marland KitchenMarland KitchenOk to fill 1-2 days early when month has 31 days  120 tablet  0  . propranolol (INDERAL) 80 MG tablet Take 1 tablet (80 mg total) by mouth 3 (three) times daily.  270 tablet  1  . Syringe/Needle, Disp, (SYRINGE 3CC/22GX1") 22G X 1" 3 ML MISC 1 each by Does not apply route every 14 (fourteen) days.  50 each  2  . testosterone cypionate (DEPOTESTOTERONE CYPIONATE) 200 MG/ML injection Inject 3 mLs (600 mg total) into the muscle every 14 (fourteen) days.  20 mL  5  . torsemide (DEMADEX) 100 MG tablet Take 1 tablet (100 mg total) by mouth daily as needed.  30 tablet  11   Current Facility-Administered Medications on File Prior to Visit  Medication Dose Route Frequency Provider Last Rate Last Dose  . 0.9 %  sodium chloride infusion  500 mL Intravenous Continuous Louis Meckel,  MD       Past Medical History  Diagnosis Date  . Diabetes mellitus     type II  . Hypertension   . LBP (low back pain)   . Palpitations   . ED (erectile dysfunction)   . OSA on CPAP   . Hypogonadism male   . Vitamin d deficiency   . Osteoarthritis, hand   . Hyperlipidemia   . Atrial fibrillation   . Depression   . PTSD (post-traumatic stress disorder)   . Osteoarthritis   . DDD (degenerative disc disease)   . DDD (degenerative disc disease)   . MVP (mitral valve prolapse)    Past Surgical History  Procedure Date  . Back surgery 1987  . Cervical laminectomy 2004    [name], rod was placed  . Appendectomy   . Cholecystectomy   . Colonoscopy   . Knee surgery     left knee/arthroscopic    reports that he quit smoking about 40 years ago. His smoking use included Cigarettes. He smoked 1 pack per day. He does not have any smokeless tobacco history on file. He reports that he does not drink alcohol or use illicit  drugs. family history includes Allergies in his mother; Coronary artery disease in his other; Diabetes in his other; Esophageal cancer in his maternal aunt; Heart attack in his father; Heart disease in his brother, father, and mother; Hypertension in his other; Other in his paternal grandmother; Rheum arthritis in his paternal grandmother; and Stroke in his mother.  There is no history of Colon cancer, and Rectal cancer, and Stomach cancer, . Allergies  Allergen Reactions  . Amlodipine Besylate     REACTION: swelling  . Cymbalta (Duloxetine Hcl)     constipation  . Duloxetine     REACTION: constipation  . Prednisolone     Edema, wt gain  . Temazepam      Review of Systems  Constitutional: Positive for fatigue and unexpected weight change. Negative for chills and appetite change.  HENT: Negative for nosebleeds, congestion, sore throat, sneezing, trouble swallowing and neck pain.   Eyes: Negative for itching and visual disturbance.  Respiratory: Negative for cough and shortness of breath.   Cardiovascular: Negative for chest pain, palpitations and leg swelling.  Gastrointestinal: Negative for nausea, diarrhea, blood in stool and abdominal distention.  Genitourinary: Negative for frequency and hematuria.  Musculoskeletal: Positive for back pain and arthralgias (better). Negative for joint swelling and gait problem.  Skin: Negative for rash.  Neurological: Negative for dizziness, tremors, speech difficulty, weakness, light-headedness and headaches.  Psychiatric/Behavioral: Positive for disturbed wake/sleep cycle and dysphoric mood. Negative for suicidal ideas and agitation. The patient is not nervous/anxious.        Objective:   Physical Exam  Constitutional: He is oriented to person, place, and time. He appears well-developed.       obese  HENT:  Mouth/Throat: Oropharynx is clear and moist.  Eyes: Conjunctivae normal are normal. Pupils are equal, round, and reactive to light.   Neck: Normal range of motion. No JVD present. No thyromegaly present.  Cardiovascular: Normal rate, regular rhythm, normal heart sounds and intact distal pulses.  Exam reveals no gallop and no friction rub.   No murmur heard. Pulmonary/Chest: Effort normal and breath sounds normal. No respiratory distress. He has no wheezes. He has no rales. He exhibits no tenderness.  Abdominal: Soft. Bowel sounds are normal. He exhibits no distension and no mass. There is no tenderness. There is no rebound and  no guarding.  Musculoskeletal: Normal range of motion. He exhibits tenderness (all spine). He exhibits no edema.       L anterior prox arm sq lump  Lymphadenopathy:    He has no cervical adenopathy.  Neurological: He is alert and oriented to person, place, and time. He has normal reflexes. No cranial nerve deficit. He exhibits normal muscle tone. Coordination normal.  Skin: Skin is warm and dry. No rash noted.  Psychiatric: His behavior is normal. Judgment and thought content normal.       Not depressed, not suicidal or homicidal  R hip is tender, stiff w/decr ROM   Lab Results  Component Value Date   WBC 9.1 08/13/2010   HGB 14.4 08/13/2010   HCT 42.1 08/13/2010   PLT 291.0 08/13/2010   GLUCOSE 98 10/23/2011   CHOL 128 12/09/2009   TRIG 134.0 12/09/2009   HDL 20.40* 12/09/2009   LDLCALC 81 12/09/2009   ALT 18 08/13/2010   AST 18 08/13/2010   NA 133* 10/23/2011   K 3.5 10/23/2011   CL 93* 10/23/2011   CREATININE 1.0 10/23/2011   BUN 11 10/23/2011   CO2 30 10/23/2011   TSH 2.66 06/08/2011   PSA 0.57 12/09/2009   HGBA1C 5.3 10/16/2011         Assessment & Plan:

## 2011-12-18 NOTE — Telephone Encounter (Signed)
  Follow up Call-  Call back number 12/17/2011  Post procedure Call Back phone  # [phone]  Permission to leave phone message Yes     Patient questions:  Do you have a fever, pain , or abdominal swelling? no Pain Score  0 *  Have you tolerated food without any problems? yes  Have you been able to return to your normal activities? yes  Do you have any questions about your discharge instructions: Diet   no Medications  no Follow up visit  no  Do you have questions or concerns about your Care? no  Actions: * If pain score is 4 or above: No action needed, pain <4.

## 2011-12-20 ENCOUNTER — Telehealth: Payer: Self-pay | Admitting: Internal Medicine

## 2011-12-20 NOTE — Telephone Encounter (Signed)
Misty Stanley, please, inform patient that he has severe hip OA as we suspected Keep ROV Thx

## 2011-12-21 NOTE — Telephone Encounter (Signed)
Left mess for patient to call back.  

## 2011-12-22 ENCOUNTER — Telehealth: Payer: Self-pay | Admitting: Internal Medicine

## 2011-12-22 NOTE — Telephone Encounter (Signed)
Misty Stanley, please, inform patient that his hip xray - hip  has severe OA He needs to see his Ortho MD for a consult Thx

## 2011-12-30 NOTE — Telephone Encounter (Signed)
Pt informed

## 2012-01-04 ENCOUNTER — Encounter: Payer: Self-pay | Admitting: *Deleted

## 2012-01-04 ENCOUNTER — Telehealth: Payer: Self-pay | Admitting: *Deleted

## 2012-01-04 NOTE — Telephone Encounter (Signed)
[employer] duty letter ready for p/u per pt request. Pt informed

## 2012-01-18 ENCOUNTER — Telehealth: Payer: Self-pay | Admitting: *Deleted

## 2012-01-18 NOTE — Telephone Encounter (Signed)
Rf req for Cyclobenzaprine 10 mg 1 po tid. # 90. Ok to Rf?

## 2012-01-18 NOTE — Telephone Encounter (Signed)
OK to fill this prescription with additional refills x5 Thank you!  

## 2012-01-19 MED ORDER — CYCLOBENZAPRINE HCL 10 MG PO TABS: 10.0000 mg | ORAL_TABLET | Freq: Three times a day (TID) | ORAL | Status: DC | PRN

## 2012-01-19 NOTE — Telephone Encounter (Signed)
Done

## 2012-01-30 ENCOUNTER — Other Ambulatory Visit: Payer: Self-pay | Admitting: Internal Medicine

## 2012-02-03 ENCOUNTER — Other Ambulatory Visit: Payer: Self-pay | Admitting: Internal Medicine

## 2012-02-15 ENCOUNTER — Telehealth: Payer: Self-pay | Admitting: *Deleted

## 2012-02-15 DIAGNOSIS — E119 Type 2 diabetes mellitus without complications: Secondary | ICD-10-CM

## 2012-02-15 DIAGNOSIS — M2550 Pain in unspecified joint: Secondary | ICD-10-CM

## 2012-02-15 DIAGNOSIS — M545 Low back pain: Secondary | ICD-10-CM

## 2012-02-15 DIAGNOSIS — R202 Paresthesia of skin: Secondary | ICD-10-CM

## 2012-02-15 DIAGNOSIS — I10 Essential (primary) hypertension: Secondary | ICD-10-CM

## 2012-02-15 DIAGNOSIS — M542 Cervicalgia: Secondary | ICD-10-CM

## 2012-02-15 DIAGNOSIS — E785 Hyperlipidemia, unspecified: Secondary | ICD-10-CM

## 2012-02-15 MED ORDER — OXYCODONE HCL 15 MG PO TABS: 15.0000 mg | ORAL_TABLET | Freq: Four times a day (QID) | ORAL | Status: DC | PRN

## 2012-02-15 MED ORDER — MORPHINE SULFATE ER 30 MG PO TBCR: 30.0000 mg | EXTENDED_RELEASE_TABLET | Freq: Two times a day (BID) | ORAL | Status: DC

## 2012-02-15 NOTE — Telephone Encounter (Signed)
OK to fill this prescription with additional refills x0 Thank you!  

## 2012-02-15 NOTE — Telephone Encounter (Signed)
Pt needs written Rxs for Oxycodone 15 mg and Morphine 30 mg. Please advise.

## 2012-02-15 NOTE — Telephone Encounter (Signed)
Rxs printed/pending MD sig.  

## 2012-02-15 NOTE — Telephone Encounter (Signed)
Pt informed rxs ready for p/u. 

## 2012-02-21 ENCOUNTER — Other Ambulatory Visit: Payer: Self-pay | Admitting: Pulmonary Disease

## 2012-02-21 DIAGNOSIS — G4733 Obstructive sleep apnea (adult) (pediatric): Secondary | ICD-10-CM

## 2012-02-23 ENCOUNTER — Other Ambulatory Visit (INDEPENDENT_AMBULATORY_CARE_PROVIDER_SITE_OTHER): Payer: Medicare Other

## 2012-02-23 ENCOUNTER — Encounter: Payer: Self-pay | Admitting: Internal Medicine

## 2012-02-23 ENCOUNTER — Ambulatory Visit (INDEPENDENT_AMBULATORY_CARE_PROVIDER_SITE_OTHER): Payer: Medicare Other | Admitting: Internal Medicine

## 2012-02-23 VITALS — BP 130/60 | HR 60 | Temp 98.4°F | Resp 16 | Wt 237.0 lb

## 2012-02-23 DIAGNOSIS — M545 Low back pain: Secondary | ICD-10-CM

## 2012-02-23 DIAGNOSIS — R609 Edema, unspecified: Secondary | ICD-10-CM

## 2012-02-23 DIAGNOSIS — E538 Deficiency of other specified B group vitamins: Secondary | ICD-10-CM

## 2012-02-23 DIAGNOSIS — M2550 Pain in unspecified joint: Secondary | ICD-10-CM

## 2012-02-23 DIAGNOSIS — E119 Type 2 diabetes mellitus without complications: Secondary | ICD-10-CM

## 2012-02-23 DIAGNOSIS — I10 Essential (primary) hypertension: Secondary | ICD-10-CM

## 2012-02-23 DIAGNOSIS — D485 Neoplasm of uncertain behavior of skin: Secondary | ICD-10-CM

## 2012-02-23 DIAGNOSIS — F329 Major depressive disorder, single episode, unspecified: Secondary | ICD-10-CM

## 2012-02-23 LAB — BASIC METABOLIC PANEL
BUN: 9 mg/dL (ref 6–23)
Chloride: 95 mEq/L — ABNORMAL LOW (ref 96–112)
GFR: 81.04 mL/min (ref >60.00)
Potassium: 4.2 mEq/L (ref 3.5–5.1)
Sodium: 132 mEq/L — ABNORMAL LOW (ref 135–145)

## 2012-02-23 NOTE — Progress Notes (Signed)
Subjective:    Patient ID: Vernon Bishop, male    DOB: Oct 28, 1940, 71 y.o.   MRN: [id number]  HPI  F/u swelling - resolved. C/o R hip pain - long time - worse now.  The patient is here to follow up on chronic depression, anxiety, headaches and chronic moderate-severe LBP and PMR symptoms. Pain is not well controlled.  He was not better on Prednisone and he stopped it. He is using a CPAP machine F/u on low Na  Wt Readings from Last 3 Encounters:  02/23/12 237 lb (107.502 kg)  12/18/11 227 lb (102.967 kg)  12/17/11 223 lb (101.152 kg)   BP Readings from Last 3 Encounters:  02/23/12 130/60  12/18/11 164/80  12/17/11 148/74   BP 130/60  Pulse 60  Temp 98.4 F (36.9 C) (Oral)  Resp 16  Wt 237 lb (107.502 kg)  Current Outpatient Prescriptions on File Prior to Visit  Medication Sig Dispense Refill  . aspirin 81 MG EC tablet Take 81 mg by mouth daily.        . benazepril-hydrochlorthiazide (LOTENSIN HCT) 20-12.5 MG per tablet TAKE 2 TABLETS BY MOUTH DAILY.  60 tablet  5  . buPROPion (WELLBUTRIN SR) 150 MG 12 hr tablet Take 150 mg by mouth daily.      . Cholecalciferol (VITAMIN D3) 1000 UNIT tablet Take 1,000 Units by mouth daily.        . clonazePAM (KLONOPIN) 1 MG tablet 1/2 tab at bedtime as needed for sleep       . clotrimazole-betamethasone (LOTRISONE) cream Apply topically 2 (two) times daily. X 1 month as needed  30 g  3  . Cyanocobalamin (VITAMIN B-12 CR) 1000 MCG TBCR Take 1,000 mcg by mouth daily.        . cyclobenzaprine (FLEXERIL) 10 MG tablet Take 1 tablet (10 mg total) by mouth 3 (three) times daily as needed.  90 tablet  5  . digoxin (LANOXIN) 0.25 MG tablet Take 0.25 mg by mouth daily.      Marland Kitchen FLUoxetine (PROZAC) 40 MG capsule Take 1 capsule (40 mg total) by mouth daily.  90 capsule  2  . gabapentin (NEURONTIN) 300 MG capsule TAKE 1 CAPSULE THREE TIMES A DAY  90 capsule  3  . morphine (MS CONTIN) 30 MG 12 hr tablet Take 1 tablet (30 mg total) by mouth 2 (two) times  daily. Please fill on or after 02/16/12  60 tablet  0  . oxyCODONE (ROXICODONE) 15 MG immediate release tablet Take 1 tablet (15 mg total) by mouth 4 (four) times daily as needed. Fill on or after 02/14/12 .Marland KitchenMarland KitchenOk to fill 1-2 days early when month has 31 days  120 tablet  0  . propranolol (INDERAL) 80 MG tablet Take 1 tablet (80 mg total) by mouth 3 (three) times daily.  270 tablet  1  . Syringe/Needle, Disp, (SYRINGE 3CC/22GX1") 22G X 1" 3 ML MISC 1 each by Does not apply route every 14 (fourteen) days.  50 each  2  . testosterone cypionate (DEPOTESTOTERONE CYPIONATE) 200 MG/ML injection Inject 3 mLs (600 mg total) into the muscle every 14 (fourteen) days.  20 mL  5  . torsemide (DEMADEX) 100 MG tablet Take 1 tablet (100 mg total) by mouth daily as needed.  30 tablet  11   Past Medical History  Diagnosis Date  . Diabetes mellitus     type II  . Hypertension   . LBP (low back pain)   . Palpitations   .  ED (erectile dysfunction)   . OSA on CPAP   . Hypogonadism male   . Vitamin D deficiency   . Osteoarthritis, hand   . Hyperlipidemia   . Atrial fibrillation   . Depression   . PTSD (post-traumatic stress disorder)   . Osteoarthritis   . DDD (degenerative disc disease)   . DDD (degenerative disc disease)   . MVP (mitral valve prolapse)    Past Surgical History  Procedure Date  . Back surgery 1987  . Cervical laminectomy 2004    [name], rod was placed  . Appendectomy   . Cholecystectomy   . Colonoscopy   . Knee surgery     left knee/arthroscopic    reports that he quit smoking about 40 years ago. His smoking use included Cigarettes. He smoked 1 pack per day. He does not have any smokeless tobacco history on file. He reports that he does not drink alcohol or use illicit drugs. family history includes Allergies in his mother; Coronary artery disease in his other; Diabetes in his other; Esophageal cancer in his maternal aunt; Heart attack in his father; Heart disease in his brother,  father, and mother; Hypertension in his other; Other in his paternal grandmother; Rheum arthritis in his paternal grandmother; and Stroke in his mother.  There is no history of Colon cancer, and Rectal cancer, and Stomach cancer, . Allergies  Allergen Reactions  . Amlodipine Besylate     REACTION: swelling  . Cymbalta (Duloxetine Hcl)     constipation  . Duloxetine     REACTION: constipation  . Prednisolone     Edema, wt gain  . Temazepam      Review of Systems  Constitutional: Positive for fatigue and unexpected weight change. Negative for chills and appetite change.  HENT: Negative for nosebleeds, congestion, sore throat, sneezing, trouble swallowing and neck pain.   Eyes: Negative for itching and visual disturbance.  Respiratory: Negative for cough and shortness of breath.   Cardiovascular: Negative for chest pain, palpitations and leg swelling.  Gastrointestinal: Negative for nausea, diarrhea, blood in stool and abdominal distention.  Genitourinary: Negative for frequency and hematuria.  Musculoskeletal: Positive for back pain and arthralgias (better). Negative for joint swelling and gait problem.  Skin: Negative for rash.  Neurological: Negative for dizziness, tremors, speech difficulty, weakness, light-headedness and headaches.  Psychiatric/Behavioral: Positive for sleep disturbance and dysphoric mood. Negative for suicidal ideas and agitation. The patient is not nervous/anxious.        Objective:   Physical Exam  Constitutional: He is oriented to person, place, and time. He appears well-developed.       obese  HENT:  Mouth/Throat: Oropharynx is clear and moist.  Eyes: Conjunctivae normal are normal. Pupils are equal, round, and reactive to light.  Neck: Normal range of motion. No JVD present. No thyromegaly present.  Cardiovascular: Normal rate, regular rhythm, normal heart sounds and intact distal pulses.  Exam reveals no gallop and no friction rub.   No murmur  heard. Pulmonary/Chest: Effort normal and breath sounds normal. No respiratory distress. He has no wheezes. He has no rales. He exhibits no tenderness.  Abdominal: Soft. Bowel sounds are normal. He exhibits no distension and no mass. There is no tenderness. There is no rebound and no guarding.  Musculoskeletal: Normal range of motion. He exhibits tenderness (all spine). He exhibits no edema.       L anterior prox arm sq lump  Lymphadenopathy:    He has no cervical adenopathy.  Neurological:  He is alert and oriented to person, place, and time. He has normal reflexes. No cranial nerve deficit. He exhibits normal muscle tone. Coordination normal.  Skin: Skin is warm and dry. No rash noted.  Psychiatric: His behavior is normal. Judgment and thought content normal.       Not depressed, not suicidal or homicidal  R hip is tender, stiff w/decr ROM Moles on R chest  Lab Results  Component Value Date   WBC 9.1 08/13/2010   HGB 14.4 08/13/2010   HCT 42.1 08/13/2010   PLT 291.0 08/13/2010   GLUCOSE 98 10/23/2011   CHOL 128 12/09/2009   TRIG 134.0 12/09/2009   HDL 20.40* 12/09/2009   LDLCALC 81 12/09/2009   ALT 18 08/13/2010   AST 18 08/13/2010   NA 133* 10/23/2011   K 3.5 10/23/2011   CL 93* 10/23/2011   CREATININE 1.0 10/23/2011   BUN 11 10/23/2011   CO2 30 10/23/2011   TSH 2.66 06/08/2011   PSA 0.57 12/09/2009   HGBA1C 5.3 10/16/2011         Assessment & Plan:

## 2012-02-23 NOTE — Assessment & Plan Note (Signed)
Continue with current prescription therapy as reflected on the Med list.  

## 2012-02-23 NOTE — Assessment & Plan Note (Signed)
Skin bx 

## 2012-02-23 NOTE — Assessment & Plan Note (Signed)
He needs to loose wt

## 2012-02-23 NOTE — Assessment & Plan Note (Addendum)
Continue with current prescription therapy as reflected on the Med list. Pain Clinic ref - he would like to see Dr Vear Clock

## 2012-03-10 ENCOUNTER — Telehealth: Payer: Self-pay | Admitting: *Deleted

## 2012-03-10 DIAGNOSIS — M545 Low back pain, unspecified: Secondary | ICD-10-CM

## 2012-03-10 DIAGNOSIS — M2550 Pain in unspecified joint: Secondary | ICD-10-CM

## 2012-03-10 DIAGNOSIS — E785 Hyperlipidemia, unspecified: Secondary | ICD-10-CM

## 2012-03-10 DIAGNOSIS — E119 Type 2 diabetes mellitus without complications: Secondary | ICD-10-CM

## 2012-03-10 DIAGNOSIS — M542 Cervicalgia: Secondary | ICD-10-CM

## 2012-03-10 DIAGNOSIS — R202 Paresthesia of skin: Secondary | ICD-10-CM

## 2012-03-10 DIAGNOSIS — I10 Essential (primary) hypertension: Secondary | ICD-10-CM

## 2012-03-10 NOTE — Telephone Encounter (Signed)
Rf req for Oxycodone 15 mg 1 po qid prn. And Morphine 30 mg 1 po bid. Ok to Rf?

## 2012-03-11 MED ORDER — MORPHINE SULFATE ER 30 MG PO TBCR: 30.0000 mg | EXTENDED_RELEASE_TABLET | Freq: Two times a day (BID) | ORAL | Status: DC

## 2012-03-11 MED ORDER — OXYCODONE HCL 15 MG PO TABS: 15.0000 mg | ORAL_TABLET | Freq: Four times a day (QID) | ORAL | Status: DC | PRN

## 2012-03-11 NOTE — Telephone Encounter (Signed)
Rxs printed Pt informed informed to p/u.

## 2012-03-11 NOTE — Telephone Encounter (Signed)
OK to fill this prescription with additional refills x0 Thank you!  

## 2012-03-14 ENCOUNTER — Telehealth: Payer: Self-pay | Admitting: *Deleted

## 2012-03-14 NOTE — Telephone Encounter (Signed)
Rf req for Clonazepam 1 mg 1 po bid prn. Last filled 02/12/12. Ok to Rf?

## 2012-03-15 ENCOUNTER — Ambulatory Visit: Payer: Medicare Other | Admitting: Internal Medicine

## 2012-03-15 MED ORDER — CLONAZEPAM 1 MG PO TABS: 1.0000 mg | ORAL_TABLET | Freq: Two times a day (BID) | ORAL | Status: DC | PRN

## 2012-03-15 NOTE — Telephone Encounter (Signed)
OK to fill this prescription with additional refills x2 Thank you!  

## 2012-03-15 NOTE — Telephone Encounter (Signed)
Done

## 2012-04-01 ENCOUNTER — Other Ambulatory Visit: Payer: Self-pay | Admitting: Internal Medicine

## 2012-04-04 ENCOUNTER — Other Ambulatory Visit: Payer: Self-pay | Admitting: *Deleted

## 2012-04-04 MED ORDER — AMLODIPINE BESYLATE 10 MG PO TABS: 10.0000 mg | ORAL_TABLET | Freq: Every day | ORAL | Status: DC

## 2012-04-11 ENCOUNTER — Telehealth: Payer: Self-pay | Admitting: *Deleted

## 2012-04-11 NOTE — Telephone Encounter (Signed)
Rf req for Testosterone cyp 200 mg/ml. inj 600 ml q 14 days. # 20 ml.  Last filled 10/08/11. Ok to Rf?

## 2012-04-11 NOTE — Telephone Encounter (Signed)
OK to fill this prescription with additional refills x5 Thank you!  

## 2012-04-12 MED ORDER — TESTOSTERONE CYPIONATE 200 MG/ML IM SOLN: 600.0000 mg | INTRAMUSCULAR | Status: DC

## 2012-04-12 NOTE — Telephone Encounter (Signed)
Done

## 2012-04-13 ENCOUNTER — Telehealth: Payer: Self-pay | Admitting: *Deleted

## 2012-04-13 DIAGNOSIS — I10 Essential (primary) hypertension: Secondary | ICD-10-CM

## 2012-04-13 DIAGNOSIS — M545 Low back pain: Secondary | ICD-10-CM

## 2012-04-13 DIAGNOSIS — M542 Cervicalgia: Secondary | ICD-10-CM

## 2012-04-13 DIAGNOSIS — M2550 Pain in unspecified joint: Secondary | ICD-10-CM

## 2012-04-13 DIAGNOSIS — R202 Paresthesia of skin: Secondary | ICD-10-CM

## 2012-04-13 DIAGNOSIS — E785 Hyperlipidemia, unspecified: Secondary | ICD-10-CM

## 2012-04-13 DIAGNOSIS — E119 Type 2 diabetes mellitus without complications: Secondary | ICD-10-CM

## 2012-04-13 NOTE — Telephone Encounter (Signed)
Also, Rf req for Morphine sulf 30 mg 1 po bid. # 60. Last filled 03/17/12. Ok to Rf?

## 2012-04-13 NOTE — Telephone Encounter (Signed)
OK to fill both. Thank you

## 2012-04-13 NOTE — Telephone Encounter (Signed)
Rf req for oxycodone 1 po qid # 120. Last fill 03/14/12. Ok to Rf?

## 2012-04-14 MED ORDER — MORPHINE SULFATE ER 30 MG PO TBCR: 30.0000 mg | EXTENDED_RELEASE_TABLET | Freq: Two times a day (BID) | ORAL | Status: DC

## 2012-04-14 MED ORDER — OXYCODONE HCL 15 MG PO TABS: 15.0000 mg | ORAL_TABLET | Freq: Four times a day (QID) | ORAL | Status: DC | PRN

## 2012-04-14 NOTE — Telephone Encounter (Signed)
Rxs printed. Pending MD sig. Pt informed to p/u later today.

## 2012-04-25 ENCOUNTER — Telehealth: Payer: Self-pay | Admitting: Internal Medicine

## 2012-04-25 ENCOUNTER — Other Ambulatory Visit: Payer: Self-pay | Admitting: *Deleted

## 2012-04-25 DIAGNOSIS — I4891 Unspecified atrial fibrillation: Secondary | ICD-10-CM

## 2012-04-25 NOTE — Telephone Encounter (Signed)
Caller: Sten/Patient; PCP: [name], Alex (Adults only); CB#: [phone];  States has been taking Digoxin "for years" and when went to pharmacy to pick up, co pay has gone to $90. Has moved to a Tier 4 drug.  Did not pick up medicine and is wanting to know if MD can change to another drug. PLEASE CALL PATIENT AND ADVISE IF CAN RECOMMEND A DIFFERENT DRUG.

## 2012-04-26 NOTE — Telephone Encounter (Signed)
There is no similar meds. Was it a generic for $90? Thx

## 2012-04-27 NOTE — Telephone Encounter (Signed)
Okay to schedule per pt.

## 2012-04-27 NOTE — Telephone Encounter (Signed)
Yes, per pt, the generic was $90.

## 2012-04-27 NOTE — Telephone Encounter (Signed)
I'm sorry to hear it. I would suggest a Card cons. OK to sch? Thx

## 2012-04-27 NOTE — Telephone Encounter (Signed)
Ok. Done 

## 2012-04-28 ENCOUNTER — Other Ambulatory Visit: Payer: Self-pay | Admitting: *Deleted

## 2012-04-28 NOTE — Telephone Encounter (Signed)
Received fax pt needing PA on his Digoxin. Plan limits 1 pill a day. Called insurance fax over quantity limitation from has been completed and fax back. Waiting on approval status...Raechel Chute

## 2012-05-02 NOTE — Telephone Encounter (Signed)
Left msg on vm Friday needing to speak with nurse. Need to verify dosage on digoxin...Raechel Chute

## 2012-05-02 NOTE — Telephone Encounter (Signed)
Per pt chart has been referred to see cardiologist to see if similar med could be rx. See previous msg...lmb

## 2012-05-05 ENCOUNTER — Encounter: Payer: Self-pay | Admitting: Cardiovascular Disease

## 2012-05-05 ENCOUNTER — Ambulatory Visit (INDEPENDENT_AMBULATORY_CARE_PROVIDER_SITE_OTHER): Payer: Medicare Other | Admitting: Cardiovascular Disease

## 2012-05-05 VITALS — BP 140/70 | HR 57 | Ht 72.0 in | Wt 237.0 lb

## 2012-05-05 DIAGNOSIS — I480 Paroxysmal atrial fibrillation: Secondary | ICD-10-CM

## 2012-05-05 DIAGNOSIS — I4891 Unspecified atrial fibrillation: Secondary | ICD-10-CM

## 2012-05-05 MED ORDER — DIGOXIN 125 MCG PO TABS: 0.1250 mg | ORAL_TABLET | Freq: Every day | ORAL | Status: DC

## 2012-05-05 NOTE — Progress Notes (Signed)
History of Present Illness: 72 yo male with history of DM, HTN, HLD, paroxysmal atrial fibrillation who is here today as a new patient to establish cardiology care. He notes having an episode of atrial fibrillation in 1992 and was started on Digoxin. No recurrence since then. Echo in 2005 with MVP, mild MR. Cardiac cath 2005 per Dr. Eden Emms with no evidence of CAD.   He is feeling well. No chest pain or SOB. No palpitations.   Primary Care Physician: [name]  Last Lipid Profile:Lipid Panel     Component Value Date/Time   CHOL 128 12/09/2009 1137   TRIG 134.0 12/09/2009 1137   HDL 20.40* 12/09/2009 1137   CHOLHDL 6 12/09/2009 1137   VLDL 26.8 12/09/2009 1137   LDLCALC 81 12/09/2009 1137     Past Medical History  Diagnosis Date  . Diabetes mellitus     type II  . Hypertension   . LBP (low back pain)   . Palpitations   . ED (erectile dysfunction)   . OSA on CPAP   . Hypogonadism male   . Vitamin D deficiency   . Osteoarthritis, hand   . Hyperlipidemia   . Atrial fibrillation     1992  . Depression   . PTSD (post-traumatic stress disorder)   . Osteoarthritis   . DDD (degenerative disc disease)   . DDD (degenerative disc disease)   . MVP (mitral valve prolapse)     Past Surgical History  Procedure Date  . Back surgery 1987  . Cervical laminectomy 2004    [name], rod was placed  . Appendectomy   . Cholecystectomy   . Colonoscopy   . Knee surgery     left knee/arthroscopic    Current Outpatient Prescriptions  Medication Sig Dispense Refill  . amLODipine (NORVASC) 10 MG tablet Take 1 tablet (10 mg total) by mouth daily.  90 tablet  2  . aspirin 81 MG EC tablet Take 81 mg by mouth daily.        . benazepril-hydrochlorthiazide (LOTENSIN HCT) 20-12.5 MG per tablet TAKE 2 TABLETS BY MOUTH DAILY.  60 tablet  5  . buPROPion (WELLBUTRIN SR) 150 MG 12 hr tablet Take 150 mg by mouth daily.      . Cholecalciferol (VITAMIN D3) 1000 UNIT tablet Take 1,000 Units by mouth daily.         . clonazePAM (KLONOPIN) 1 MG tablet Take 1 tablet (1 mg total) by mouth 2 (two) times daily as needed for anxiety. 1/2 tab at bedtime as needed for sleep  60 tablet  2  . clotrimazole-betamethasone (LOTRISONE) cream Apply topically 2 (two) times daily. X 1 month as needed  30 g  3  . Cyanocobalamin (VITAMIN B-12 CR) 1000 MCG TBCR Take 1,000 mcg by mouth daily.        . cyclobenzaprine (FLEXERIL) 10 MG tablet Take 1 tablet (10 mg total) by mouth 3 (three) times daily as needed.  90 tablet  5  . digoxin (LANOXIN) 0.25 MG tablet Take 0.25 mg by mouth daily.      Marland Kitchen FLUoxetine (PROZAC) 40 MG capsule Take 1 capsule (40 mg total) by mouth daily.  90 capsule  2  . gabapentin (NEURONTIN) 300 MG capsule TAKE 1 CAPSULE THREE TIMES A DAY  90 capsule  3  . morphine (MS CONTIN) 30 MG 12 hr tablet Take 1 tablet (30 mg total) by mouth 2 (two) times daily. Please fill on or after 04/17/12. Ok to fill 1-2 days early  when month has 31 days  60 tablet  0  . oxyCODONE (ROXICODONE) 15 MG immediate release tablet Take 1 tablet (15 mg total) by mouth 4 (four) times daily as needed. Fill on or after 04/15/12 .Marland KitchenMarland KitchenOk to fill 1-2 days early when month has 31 days  120 tablet  0  . propranolol (INDERAL) 80 MG tablet TAKE 1 TABLET BY MOUTH 3 TIMES DAILY.  270 tablet  1  . Syringe/Needle, Disp, (SYRINGE 3CC/22GX1") 22G X 1" 3 ML MISC 1 each by Does not apply route every 14 (fourteen) days.  50 each  2  . testosterone cypionate (DEPOTESTOTERONE CYPIONATE) 200 MG/ML injection Inject 3 mLs (600 mg total) into the muscle every 14 (fourteen) days.  20 mL  5  . torsemide (DEMADEX) 100 MG tablet Take 1 tablet (100 mg total) by mouth daily as needed.  30 tablet  11    Allergies  Allergen Reactions  . Amlodipine Besylate     REACTION: swelling  . Cymbalta (Duloxetine Hcl)     constipation  . Duloxetine     REACTION: constipation  . Prednisolone     Edema, wt gain  . Temazepam     History   Social History  . Marital  Status: Married    Spouse Name: N/A    Number of Children: 4  . Years of Education: N/A   Occupational History  . Retired-     Industrial/product designer   Social History Main Topics  . Smoking status: Former Smoker -- 1.0 packs/day for 12 years    Types: Cigarettes    Quit date: 03/31/1971  . Smokeless tobacco: Not on file     Comment: Started at age 86  . Alcohol Use: 0.5 oz/week    1 drink(s) per week     Comment: occasionally  . Drug Use: No  . Sexually Active: Yes   Other Topics Concern  . Not on file   Social History Narrative   FAMILY HISTORYHistory of CAD Male 1st degree relative <50History Diabetes 1st degree relativeHistory hypertension    Family History  Problem Relation Age of Onset  . Allergies Mother   . Stroke Mother     Clotting disorders  . Heart disease Mother   . Heart disease Father   . Heart attack Father 67  . Rheum arthritis Paternal Grandmother   . Other Paternal Grandmother     Rheumatism  . Coronary artery disease Other   . Diabetes Other   . Hypertension Other   . Heart disease Brother   . Esophageal cancer Maternal Aunt   . Colon cancer Neg Hx   . Rectal cancer Neg Hx   . Stomach cancer Neg Hx     Review of Systems:  As stated in the HPI and otherwise negative.   BP 140/70  Pulse 57  Ht 6' (1.829 m)  Wt 237 lb (107.502 kg)  BMI 32.14 kg/m2  Physical Examination: General: Well developed, well nourished, NAD HEENT: OP clear, mucus membranes moist SKIN: warm, dry. No rashes. Neuro: No focal deficits Musculoskeletal: Muscle strength 5/5 all ext Psychiatric: Mood and affect normal Neck: No JVD, no carotid bruits, no thyromegaly, no lymphadenopathy. Lungs:Clear bilaterally, no wheezes, rhonci, crackles Cardiovascular: Regular rate and rhythm. No murmurs, gallops or rubs. Abdomen:Soft. Bowel sounds present. Non-tender.  Extremities: No lower extremity edema. Pulses are 2 + in the bilateral DP/PT.  EKG: sinus brady  Assessment and Plan:    1. Paroxysmal atrial fibrillation: No recurrence. Will  change digoxin to 0.125 mg po Qdaily which will be covered by his plan. Continue beta blocker. Check echo to assess LV size and function. If he has recurrence of palpitations, will need monitor.

## 2012-05-05 NOTE — Patient Instructions (Addendum)
Your physician wants you to follow-up in:  12 months. You will receive a reminder letter in the mail two months in advance. If you don't receive a letter, please call our office to schedule the follow-up appointment.  Your physician has requested that you have an echocardiogram. Echocardiography is a painless test that uses sound waves to create images of your heart. It provides your doctor with information about the size and shape of your heart and how well your heart's chambers and valves are working. This procedure takes approximately one hour. There are no restrictions for this procedure.   Your physician has recommended you make the following change in your medication: Decrease Lanoxin to 0.125 mg by mouth daily

## 2012-05-09 ENCOUNTER — Ambulatory Visit (HOSPITAL_COMMUNITY): Payer: Medicare Other | Attending: Cardiology | Admitting: Radiology

## 2012-05-09 DIAGNOSIS — I4891 Unspecified atrial fibrillation: Secondary | ICD-10-CM

## 2012-05-09 DIAGNOSIS — I480 Paroxysmal atrial fibrillation: Secondary | ICD-10-CM

## 2012-05-09 DIAGNOSIS — I059 Rheumatic mitral valve disease, unspecified: Secondary | ICD-10-CM | POA: Insufficient documentation

## 2012-05-09 NOTE — Progress Notes (Signed)
Echocardiogram performed.  

## 2012-05-16 ENCOUNTER — Telehealth: Payer: Self-pay | Admitting: *Deleted

## 2012-05-16 DIAGNOSIS — E119 Type 2 diabetes mellitus without complications: Secondary | ICD-10-CM

## 2012-05-16 DIAGNOSIS — E785 Hyperlipidemia, unspecified: Secondary | ICD-10-CM

## 2012-05-16 DIAGNOSIS — M542 Cervicalgia: Secondary | ICD-10-CM

## 2012-05-16 DIAGNOSIS — I10 Essential (primary) hypertension: Secondary | ICD-10-CM

## 2012-05-16 DIAGNOSIS — R202 Paresthesia of skin: Secondary | ICD-10-CM

## 2012-05-16 DIAGNOSIS — M2550 Pain in unspecified joint: Secondary | ICD-10-CM

## 2012-05-16 DIAGNOSIS — M545 Low back pain: Secondary | ICD-10-CM

## 2012-05-16 MED ORDER — OXYCODONE HCL 15 MG PO TABS: 15.0000 mg | ORAL_TABLET | Freq: Four times a day (QID) | ORAL | Status: DC | PRN

## 2012-05-16 MED ORDER — MORPHINE SULFATE ER 30 MG PO TBCR: 30.0000 mg | EXTENDED_RELEASE_TABLET | Freq: Two times a day (BID) | ORAL | Status: DC

## 2012-05-16 NOTE — Telephone Encounter (Signed)
  Rx printed/ signed and given to pt.

## 2012-05-16 NOTE — Telephone Encounter (Signed)
Ok Thx 

## 2012-05-16 NOTE — Telephone Encounter (Signed)
Rf req for Morphine Sulf ER 30 mg 1 po bid # 60. Last filled 04/15/12. Ok to Rf?

## 2012-05-16 NOTE — Addendum Note (Signed)
Addended by: Merrilyn Puma on: 05/16/2012 01:30 PM   Modules accepted: Orders

## 2012-05-24 ENCOUNTER — Encounter: Payer: Self-pay | Admitting: Internal Medicine

## 2012-05-24 ENCOUNTER — Ambulatory Visit (INDEPENDENT_AMBULATORY_CARE_PROVIDER_SITE_OTHER): Payer: Medicare Other | Admitting: Internal Medicine

## 2012-05-24 VITALS — BP 150/70 | HR 80 | Temp 98.0°F | Resp 16 | Wt 239.0 lb

## 2012-05-24 DIAGNOSIS — M2550 Pain in unspecified joint: Secondary | ICD-10-CM

## 2012-05-24 DIAGNOSIS — L570 Actinic keratosis: Secondary | ICD-10-CM | POA: Insufficient documentation

## 2012-05-24 MED ORDER — OXYCODONE HCL 15 MG PO TABS: 15.0000 mg | ORAL_TABLET | Freq: Four times a day (QID) | ORAL | Status: DC | PRN

## 2012-05-24 MED ORDER — MORPHINE SULFATE ER 30 MG PO TBCR: 30.0000 mg | EXTENDED_RELEASE_TABLET | Freq: Two times a day (BID) | ORAL | Status: DC

## 2012-05-24 NOTE — Patient Instructions (Addendum)
   Postprocedure instructions :     Keep the wounds clean. You can wash them with liquid soap and water. Pat dry with gauze or a Kleenex tissue  Before applying antibiotic ointment and a Band-Aid.   You need to report immediately  if  any signs of infection develop.    

## 2012-05-24 NOTE — Assessment & Plan Note (Signed)
Cryo  

## 2012-05-24 NOTE — Progress Notes (Signed)
Subjective:    HPI  F/u swelling - resolved. C/o R hip pain - long time - better now.  The patient is here to follow up on chronic depression, anxiety, headaches and chronic moderate-severe LBP and PMR symptoms. Pain is not well controlled.  He was not better on Prednisone and he stopped it. He is using a CPAP machine F/u on low Na  Wt Readings from Last 3 Encounters:  05/24/12 239 lb (108.41 kg)  05/05/12 237 lb (107.502 kg)  02/23/12 237 lb (107.502 kg)   BP Readings from Last 3 Encounters:  05/24/12 150/70  05/05/12 140/70  02/23/12 130/60   BP 150/70  Pulse 80  Temp(Src) 98 F (36.7 C) (Oral)  Resp 16  Wt 239 lb (108.41 kg)  BMI 32.41 kg/m2  Current Outpatient Prescriptions on File Prior to Visit  Medication Sig Dispense Refill  . amLODipine (NORVASC) 10 MG tablet Take 1 tablet (10 mg total) by mouth daily.  90 tablet  2  . aspirin 81 MG EC tablet Take 81 mg by mouth daily.        . benazepril-hydrochlorthiazide (LOTENSIN HCT) 20-12.5 MG per tablet TAKE 2 TABLETS BY MOUTH DAILY.  60 tablet  5  . buPROPion (WELLBUTRIN SR) 150 MG 12 hr tablet Take 150 mg by mouth daily.      . Cholecalciferol (VITAMIN D3) 1000 UNIT tablet Take 1,000 Units by mouth daily.        . clonazePAM (KLONOPIN) 1 MG tablet Take 1 tablet (1 mg total) by mouth 2 (two) times daily as needed for anxiety. 1/2 tab at bedtime as needed for sleep  60 tablet  2  . clotrimazole-betamethasone (LOTRISONE) cream Apply topically 2 (two) times daily. X 1 month as needed  30 g  3  . Cyanocobalamin (VITAMIN B-12 CR) 1000 MCG TBCR Take 1,000 mcg by mouth daily.        . cyclobenzaprine (FLEXERIL) 10 MG tablet Take 1 tablet (10 mg total) by mouth 3 (three) times daily as needed.  90 tablet  5  . digoxin (LANOXIN) 0.125 MG tablet Take 1 tablet (0.125 mg total) by mouth daily.  30 tablet  11  . FLUoxetine (PROZAC) 40 MG capsule Take 1 capsule (40 mg total) by mouth daily.  90 capsule  2  . gabapentin (NEURONTIN) 300  MG capsule TAKE 1 CAPSULE THREE TIMES A DAY  90 capsule  3  . morphine (MS CONTIN) 30 MG 12 hr tablet Take 1 tablet (30 mg total) by mouth 2 (two) times daily. Please fill on or after 05/18/12. Ok to fill 1-2 days early when month has 31 days  60 tablet  0  . oxyCODONE (ROXICODONE) 15 MG immediate release tablet Take 1 tablet (15 mg total) by mouth 4 (four) times daily as needed. Fill on or after 05/16/12 .Marland KitchenMarland KitchenOk to fill 1-2 days early when month has 31 days  120 tablet  0  . propranolol (INDERAL) 80 MG tablet TAKE 1 TABLET BY MOUTH 3 TIMES DAILY.  270 tablet  1  . Syringe/Needle, Disp, (SYRINGE 3CC/22GX1") 22G X 1" 3 ML MISC 1 each by Does not apply route every 14 (fourteen) days.  50 each  2  . testosterone cypionate (DEPOTESTOTERONE CYPIONATE) 200 MG/ML injection Inject 3 mLs (600 mg total) into the muscle every 14 (fourteen) days.  20 mL  5  . torsemide (DEMADEX) 100 MG tablet Take 1 tablet (100 mg total) by mouth daily as needed.  30 tablet  11   No current facility-administered medications on file prior to visit.   Past Medical History  Diagnosis Date  . Diabetes mellitus     type II  . Hypertension   . LBP (low back pain)   . Palpitations   . ED (erectile dysfunction)   . OSA on CPAP   . Hypogonadism male   . Vitamin D deficiency   . Osteoarthritis, hand   . Hyperlipidemia   . Atrial fibrillation     1992  . Depression   . PTSD (post-traumatic stress disorder)   . Osteoarthritis   . DDD (degenerative disc disease)   . DDD (degenerative disc disease)   . MVP (mitral valve prolapse)    Past Surgical History  Procedure Laterality Date  . Back surgery  1987  . Cervical laminectomy  2004    [name], rod was placed  . Appendectomy    . Cholecystectomy    . Colonoscopy    . Knee surgery      left knee/arthroscopic    reports that he quit smoking about 41 years ago. His smoking use included Cigarettes. He has a 12 pack-year smoking history. He does not have any smokeless  tobacco history on file. He reports that he drinks about 0.5 ounces of alcohol per week. He reports that he does not use illicit drugs. family history includes Allergies in his mother; Coronary artery disease in his other; Diabetes in his other; Esophageal cancer in his maternal aunt; Heart attack (age of onset: 60) in his father; Heart disease in his brother, father, and mother; Hypertension in his other; Other in his paternal grandmother; Rheum arthritis in his paternal grandmother; and Stroke in his mother.  There is no history of Colon cancer, and Rectal cancer, and Stomach cancer, . Allergies  Allergen Reactions  . Amlodipine Besylate     REACTION: swelling  . Cymbalta (Duloxetine Hcl)     constipation  . Duloxetine     REACTION: constipation  . Prednisolone     Edema, wt gain  . Temazepam      Review of Systems  Constitutional: Positive for fatigue and unexpected weight change. Negative for chills and appetite change.  HENT: Negative for nosebleeds, congestion, sore throat, sneezing, trouble swallowing and neck pain.   Eyes: Negative for itching and visual disturbance.  Respiratory: Negative for cough and shortness of breath.   Cardiovascular: Negative for chest pain, palpitations and leg swelling.  Gastrointestinal: Negative for nausea, diarrhea, blood in stool and abdominal distention.  Genitourinary: Negative for frequency and hematuria.  Musculoskeletal: Positive for back pain and arthralgias (better). Negative for joint swelling and gait problem.  Skin: Negative for rash.  Neurological: Negative for dizziness, tremors, speech difficulty, weakness, light-headedness and headaches.  Psychiatric/Behavioral: Positive for sleep disturbance and dysphoric mood. Negative for suicidal ideas and agitation. The patient is not nervous/anxious.        Objective:   Physical Exam  Constitutional: He is oriented to person, place, and time. He appears well-developed.  obese  HENT:   Mouth/Throat: Oropharynx is clear and moist.  Eyes: Conjunctivae are normal. Pupils are equal, round, and reactive to light.  Neck: Normal range of motion. No JVD present. No thyromegaly present.  Cardiovascular: Normal rate, regular rhythm, normal heart sounds and intact distal pulses.  Exam reveals no gallop and no friction rub.   No murmur heard. Pulmonary/Chest: Effort normal and breath sounds normal. No respiratory distress. He has no wheezes. He has no rales. He exhibits  no tenderness.  Abdominal: Soft. Bowel sounds are normal. He exhibits no distension and no mass. There is no tenderness. There is no rebound and no guarding.  Musculoskeletal: Normal range of motion. He exhibits tenderness (all spine). He exhibits no edema.  L anterior prox arm sq lump  Lymphadenopathy:    He has no cervical adenopathy.  Neurological: He is alert and oriented to person, place, and time. He has normal reflexes. No cranial nerve deficit. He exhibits normal muscle tone. Coordination normal.  Skin: Skin is warm and dry. No rash noted.  Psychiatric: His behavior is normal. Judgment and thought content normal.  Not depressed, not suicidal or homicidal  R hip is tender, stiff w/decr ROM AKs on face  Lab Results  Component Value Date   WBC 9.1 08/13/2010   HGB 14.4 08/13/2010   HCT 42.1 08/13/2010   PLT 291.0 08/13/2010   GLUCOSE 99 02/23/2012   CHOL 128 12/09/2009   TRIG 134.0 12/09/2009   HDL 20.40* 12/09/2009   LDLCALC 81 12/09/2009   ALT 18 08/13/2010   AST 18 08/13/2010   NA 132* 02/23/2012   K 4.2 02/23/2012   CL 95* 02/23/2012   CREATININE 1.0 02/23/2012   BUN 9 02/23/2012   CO2 32 02/23/2012   TSH 2.66 06/08/2011   PSA 0.57 12/09/2009   HGBA1C 5.3 10/16/2011    Procedure Note :     Procedure : Cryosurgery   Indication:   Actinic keratosis(es)   Risks including unsuccessful procedure , bleeding, infection, bruising, scar, a need for a repeat  procedure and others were explained to the patient  in detail as well as the benefits. Informed consent was obtained verbally.    8 lesion(s)  on  face  was/were treated with liquid nitrogen on a Q-tip in a usual fasion . Band-Aid was applied and antibiotic ointment was given for a later use.   Tolerated well. Complications none.   Postprocedure instructions :     Keep the wounds clean. You can wash them with liquid soap and water. Pat dry with gauze or a Kleenex tissue  Before applying antibiotic ointment and a Band-Aid.   You need to report immediately  if  any signs of infection develop.         Assessment & Plan:

## 2012-05-25 ENCOUNTER — Telehealth: Payer: Self-pay | Admitting: *Deleted

## 2012-05-25 NOTE — Telephone Encounter (Signed)
Digoxin exception request is approved at tier level 3 until 05/03/2013.

## 2012-05-30 ENCOUNTER — Other Ambulatory Visit: Payer: Self-pay | Admitting: Internal Medicine

## 2012-06-13 ENCOUNTER — Telehealth: Payer: Self-pay | Admitting: *Deleted

## 2012-06-13 NOTE — Telephone Encounter (Signed)
Rf req for Clonazepam 1 mg 1 po bid prn. # 60 Last filled 02.13.14. Ok to Rf?

## 2012-06-14 NOTE — Telephone Encounter (Signed)
OK to fill this prescription with additional refills x1 Thank you!  

## 2012-06-16 ENCOUNTER — Other Ambulatory Visit: Payer: Self-pay | Admitting: *Deleted

## 2012-06-17 MED ORDER — CLONAZEPAM 1 MG PO TABS: 1.0000 mg | ORAL_TABLET | Freq: Two times a day (BID) | ORAL | Status: DC | PRN

## 2012-06-17 NOTE — Telephone Encounter (Signed)
Re-printed rx and fax into [facility]...Vernon Bishop

## 2012-06-17 NOTE — Telephone Encounter (Signed)
Faxed script back to [facility]...lmb

## 2012-06-17 NOTE — Addendum Note (Signed)
Addended by: Deatra James on: 06/17/2012 01:22 PM   Modules accepted: Orders

## 2012-06-17 NOTE — Telephone Encounter (Signed)
MD out of office. Is this ok to refill.../lmb 

## 2012-06-22 NOTE — Telephone Encounter (Signed)
Done

## 2012-06-29 ENCOUNTER — Encounter: Payer: Self-pay | Admitting: Internal Medicine

## 2012-06-29 ENCOUNTER — Ambulatory Visit (INDEPENDENT_AMBULATORY_CARE_PROVIDER_SITE_OTHER): Payer: Medicare Other | Admitting: Internal Medicine

## 2012-06-29 VITALS — BP 156/90 | HR 80 | Temp 98.3°F | Resp 16 | Wt 229.0 lb

## 2012-06-29 DIAGNOSIS — J019 Acute sinusitis, unspecified: Secondary | ICD-10-CM

## 2012-06-29 MED ORDER — AMOXICILLIN-POT CLAVULANATE 875-125 MG PO TABS: 1.0000 | ORAL_TABLET | Freq: Two times a day (BID) | ORAL | Status: DC

## 2012-06-29 NOTE — Assessment & Plan Note (Signed)
Augmentin Rx 

## 2012-06-29 NOTE — Progress Notes (Signed)
Patient ID: Vernon Bishop, male   DOB: 1940-08-27, 72 y.o.   MRN: [id number]   Subjective:    Sinusitis This is a new problem. The current episode started in the past 7 days. The problem has been gradually worsening since onset. There has been no fever. The pain is moderate. Associated symptoms include sinus pressure. Pertinent negatives include no chills, congestion, coughing, headaches, neck pain, shortness of breath, sneezing or sore throat. Past treatments include nothing.    F/u swelling - resolved. C/o R hip pain - long time - better now.  The patient is here to follow up on chronic depression, anxiety, headaches and chronic moderate-severe LBP and PMR symptoms. Pain is not well controlled.  He was not better on Prednisone and he stopped it. He is using a CPAP machine F/u on low Na  Wt Readings from Last 3 Encounters:  06/29/12 229 lb (103.874 kg)  05/24/12 239 lb (108.41 kg)  05/05/12 237 lb (107.502 kg)   BP Readings from Last 3 Encounters:  06/29/12 156/90  05/24/12 150/70  05/05/12 140/70   BP 156/90  Pulse 80  Temp(Src) 98.3 F (36.8 C) (Oral)  Resp 16  Wt 229 lb (103.874 kg)  BMI 31.05 kg/m2  Current Outpatient Prescriptions on File Prior to Visit  Medication Sig Dispense Refill  . amLODipine (NORVASC) 10 MG tablet Take 1 tablet (10 mg total) by mouth daily.  90 tablet  2  . aspirin 81 MG EC tablet Take 81 mg by mouth daily.        . benazepril-hydrochlorthiazide (LOTENSIN HCT) 20-12.5 MG per tablet TAKE 2 TABLETS BY MOUTH DAILY.  60 tablet  5  . buPROPion (WELLBUTRIN SR) 150 MG 12 hr tablet TAKE 1 TABLET BY MOUTH 2 TIMES DAILY.  60 tablet  5  . Cholecalciferol (VITAMIN D3) 1000 UNIT tablet Take 1,000 Units by mouth daily.        . clonazePAM (KLONOPIN) 1 MG tablet Take 1 tablet (1 mg total) by mouth 2 (two) times daily as needed for anxiety. 1/2 tab at bedtime as needed for sleep  60 tablet  2  . clotrimazole-betamethasone (LOTRISONE) cream Apply topically 2 (two)  times daily. X 1 month as needed  30 g  3  . Cyanocobalamin (VITAMIN B-12 CR) 1000 MCG TBCR Take 1,000 mcg by mouth daily.        . cyclobenzaprine (FLEXERIL) 10 MG tablet Take 1 tablet (10 mg total) by mouth 3 (three) times daily as needed.  90 tablet  5  . digoxin (LANOXIN) 0.125 MG tablet Take 1 tablet (0.125 mg total) by mouth daily.  30 tablet  11  . FLUoxetine (PROZAC) 40 MG capsule Take 1 capsule (40 mg total) by mouth daily.  90 capsule  2  . gabapentin (NEURONTIN) 300 MG capsule TAKE 1 CAPSULE THREE TIMES A DAY  90 capsule  3  . morphine (MS CONTIN) 30 MG 12 hr tablet Take 1 tablet (30 mg total) by mouth 2 (two) times daily. Please fill on or after 07/16/12. Ok to fill 1-2 days early when month has 31 days  60 tablet  0  . oxyCODONE (ROXICODONE) 15 MG immediate release tablet Take 1 tablet (15 mg total) by mouth 4 (four) times daily as needed. Fill on or after 07/14/12 .Marland KitchenMarland KitchenOk to fill 1-2 days early when month has 31 days  120 tablet  0  . propranolol (INDERAL) 80 MG tablet TAKE 1 TABLET BY MOUTH 3 TIMES DAILY.  270  tablet  1  . Syringe/Needle, Disp, (SYRINGE 3CC/22GX1") 22G X 1" 3 ML MISC 1 each by Does not apply route every 14 (fourteen) days.  50 each  2  . testosterone cypionate (DEPOTESTOTERONE CYPIONATE) 200 MG/ML injection Inject 3 mLs (600 mg total) into the muscle every 14 (fourteen) days.  20 mL  5  . torsemide (DEMADEX) 100 MG tablet Take 1 tablet (100 mg total) by mouth daily as needed.  30 tablet  11   No current facility-administered medications on file prior to visit.   Past Medical History  Diagnosis Date  . Diabetes mellitus     type II  . Hypertension   . LBP (low back pain)   . Palpitations   . ED (erectile dysfunction)   . OSA on CPAP   . Hypogonadism male   . Vitamin D deficiency   . Osteoarthritis, hand   . Hyperlipidemia   . Atrial fibrillation     1992  . Depression   . PTSD (post-traumatic stress disorder)   . Osteoarthritis   . DDD (degenerative disc  disease)   . DDD (degenerative disc disease)   . MVP (mitral valve prolapse)    Past Surgical History  Procedure Laterality Date  . Back surgery  1987  . Cervical laminectomy  2004    [name], rod was placed  . Appendectomy    . Cholecystectomy    . Colonoscopy    . Knee surgery      left knee/arthroscopic    reports that he quit smoking about 41 years ago. His smoking use included Cigarettes. He has a 12 pack-year smoking history. He does not have any smokeless tobacco history on file. He reports that he drinks about 0.5 ounces of alcohol per week. He reports that he does not use illicit drugs. family history includes Allergies in his mother; Coronary artery disease in his other; Diabetes in his other; Esophageal cancer in his maternal aunt; Heart attack (age of onset: 52) in his father; Heart disease in his brother, father, and mother; Hypertension in his other; Other in his paternal grandmother; Rheum arthritis in his paternal grandmother; and Stroke in his mother.  There is no history of Colon cancer, and Rectal cancer, and Stomach cancer, . Allergies  Allergen Reactions  . Amlodipine Besylate     REACTION: swelling  . Cymbalta (Duloxetine Hcl)     constipation  . Duloxetine     REACTION: constipation  . Prednisolone     Edema, wt gain  . Temazepam      Review of Systems  Constitutional: Positive for fatigue and unexpected weight change. Negative for chills and appetite change.  HENT: Positive for sinus pressure. Negative for nosebleeds, congestion, sore throat, sneezing, trouble swallowing and neck pain.   Eyes: Negative for itching and visual disturbance.  Respiratory: Negative for cough and shortness of breath.   Cardiovascular: Negative for chest pain, palpitations and leg swelling.  Gastrointestinal: Negative for nausea, diarrhea, blood in stool and abdominal distention.  Genitourinary: Negative for frequency and hematuria.  Musculoskeletal: Positive for back pain and  arthralgias (better). Negative for joint swelling and gait problem.  Skin: Negative for rash.  Neurological: Negative for dizziness, tremors, speech difficulty, weakness, light-headedness and headaches.  Psychiatric/Behavioral: Positive for sleep disturbance and dysphoric mood. Negative for suicidal ideas and agitation. The patient is not nervous/anxious.        Objective:   Physical Exam  Constitutional: He is oriented to person, place, and time. He appears  well-developed. No distress.  obese  HENT:  Mouth/Throat: Oropharynx is clear and moist.  Brown mucus in B nostrils  Eyes: Conjunctivae are normal. Pupils are equal, round, and reactive to light.  Neck: Normal range of motion. No JVD present. No thyromegaly present.  Cardiovascular: Normal rate, regular rhythm, normal heart sounds and intact distal pulses.  Exam reveals no gallop and no friction rub.   No murmur heard. Pulmonary/Chest: Effort normal and breath sounds normal. No respiratory distress. He has no wheezes. He has no rales. He exhibits no tenderness.  Abdominal: Soft. Bowel sounds are normal. He exhibits no distension and no mass. There is no tenderness. There is no rebound and no guarding.  Musculoskeletal: Normal range of motion. He exhibits tenderness (all spine). He exhibits no edema.  L anterior prox arm sq lump  Lymphadenopathy:    He has no cervical adenopathy.  Neurological: He is alert and oriented to person, place, and time. He has normal reflexes. No cranial nerve deficit. He exhibits normal muscle tone. Coordination normal.  Skin: Skin is warm and dry. No rash noted.  Psychiatric: His behavior is normal. Judgment and thought content normal.  Not depressed, not suicidal or homicidal  R hip is tender, stiff w/decr ROM AKs on face  Lab Results  Component Value Date   WBC 9.1 08/13/2010   HGB 14.4 08/13/2010   HCT 42.1 08/13/2010   PLT 291.0 08/13/2010   GLUCOSE 99 02/23/2012   CHOL 128 12/09/2009   TRIG 134.0  12/09/2009   HDL 20.40* 12/09/2009   LDLCALC 81 12/09/2009   ALT 18 08/13/2010   AST 18 08/13/2010   NA 132* 02/23/2012   K 4.2 02/23/2012   CL 95* 02/23/2012   CREATININE 1.0 02/23/2012   BUN 9 02/23/2012   CO2 32 02/23/2012   TSH 2.66 06/08/2011   PSA 0.57 12/09/2009   HGBA1C 5.3 10/16/2011    Procedure Note :     Procedure : Cryosurgery   Indication:   Actinic keratosis(es)   Risks including unsuccessful procedure , bleeding, infection, bruising, scar, a need for a repeat  procedure and others were explained to the patient in detail as well as the benefits. Informed consent was obtained verbally.    8 lesion(s)  on  face  was/were treated with liquid nitrogen on a Q-tip in a usual fasion . Band-Aid was applied and antibiotic ointment was given for a later use.   Tolerated well. Complications none.   Postprocedure instructions :     Keep the wounds clean. You can wash them with liquid soap and water. Pat dry with gauze or a Kleenex tissue  Before applying antibiotic ointment and a Band-Aid.   You need to report immediately  if  any signs of infection develop.         Assessment & Plan:

## 2012-07-11 ENCOUNTER — Other Ambulatory Visit: Payer: Self-pay | Admitting: Internal Medicine

## 2012-07-22 ENCOUNTER — Other Ambulatory Visit: Payer: Self-pay | Admitting: *Deleted

## 2012-07-22 MED ORDER — GABAPENTIN 300 MG PO CAPS: 300.0000 mg | ORAL_CAPSULE | Freq: Three times a day (TID) | ORAL | Status: DC

## 2012-08-11 ENCOUNTER — Telehealth: Payer: Self-pay | Admitting: Internal Medicine

## 2012-08-11 DIAGNOSIS — M2550 Pain in unspecified joint: Secondary | ICD-10-CM

## 2012-08-11 DIAGNOSIS — M545 Low back pain: Secondary | ICD-10-CM

## 2012-08-11 DIAGNOSIS — E119 Type 2 diabetes mellitus without complications: Secondary | ICD-10-CM

## 2012-08-11 DIAGNOSIS — M542 Cervicalgia: Secondary | ICD-10-CM

## 2012-08-11 DIAGNOSIS — E785 Hyperlipidemia, unspecified: Secondary | ICD-10-CM

## 2012-08-11 DIAGNOSIS — I10 Essential (primary) hypertension: Secondary | ICD-10-CM

## 2012-08-11 DIAGNOSIS — R202 Paresthesia of skin: Secondary | ICD-10-CM

## 2012-08-11 MED ORDER — MORPHINE SULFATE ER 30 MG PO TBCR: 30.0000 mg | EXTENDED_RELEASE_TABLET | Freq: Two times a day (BID) | ORAL | Status: DC

## 2012-08-11 MED ORDER — OXYCODONE HCL 15 MG PO TABS: 15.0000 mg | ORAL_TABLET | Freq: Four times a day (QID) | ORAL | Status: DC | PRN

## 2012-08-11 NOTE — Telephone Encounter (Signed)
Rxs signed and given to pt's son.

## 2012-08-11 NOTE — Telephone Encounter (Signed)
Rxs printed/pending MD sig.

## 2012-08-11 NOTE — Telephone Encounter (Signed)
Request refill on oxyCODONE (ROXICODONE) 15 MG and  morphine (MS CONTIN) 30 MG 12 hr tablet

## 2012-08-24 ENCOUNTER — Ambulatory Visit (INDEPENDENT_AMBULATORY_CARE_PROVIDER_SITE_OTHER): Payer: Medicare Other | Admitting: Internal Medicine

## 2012-08-24 ENCOUNTER — Encounter: Payer: Self-pay | Admitting: Internal Medicine

## 2012-08-24 VITALS — BP 158/70 | HR 72 | Temp 98.4°F | Resp 16 | Wt 226.0 lb

## 2012-08-24 DIAGNOSIS — F329 Major depressive disorder, single episode, unspecified: Secondary | ICD-10-CM

## 2012-08-24 DIAGNOSIS — E785 Hyperlipidemia, unspecified: Secondary | ICD-10-CM

## 2012-08-24 DIAGNOSIS — R202 Paresthesia of skin: Secondary | ICD-10-CM

## 2012-08-24 DIAGNOSIS — E538 Deficiency of other specified B group vitamins: Secondary | ICD-10-CM

## 2012-08-24 DIAGNOSIS — M545 Low back pain, unspecified: Secondary | ICD-10-CM

## 2012-08-24 DIAGNOSIS — M542 Cervicalgia: Secondary | ICD-10-CM

## 2012-08-24 DIAGNOSIS — I10 Essential (primary) hypertension: Secondary | ICD-10-CM

## 2012-08-24 DIAGNOSIS — E119 Type 2 diabetes mellitus without complications: Secondary | ICD-10-CM

## 2012-08-24 DIAGNOSIS — R209 Unspecified disturbances of skin sensation: Secondary | ICD-10-CM

## 2012-08-24 DIAGNOSIS — F3289 Other specified depressive episodes: Secondary | ICD-10-CM

## 2012-08-24 DIAGNOSIS — M2550 Pain in unspecified joint: Secondary | ICD-10-CM

## 2012-08-24 MED ORDER — OXYCODONE HCL 15 MG PO TABS: 15.0000 mg | ORAL_TABLET | Freq: Four times a day (QID) | ORAL | Status: DC | PRN

## 2012-08-24 MED ORDER — MORPHINE SULFATE ER 30 MG PO TBCR: 30.0000 mg | EXTENDED_RELEASE_TABLET | Freq: Two times a day (BID) | ORAL | Status: DC

## 2012-08-24 NOTE — Progress Notes (Signed)
Subjective:    HPI  F/u swelling - resolved. C/o R hip pain - long time - better now.  The patient is here to follow up on chronic depression, anxiety, headaches and chronic moderate-severe LBP and PMR symptoms. Pain is not well controlled.  He was not better on Prednisone and he stopped it. He is using a CPAP machine F/u on low Na  Wt Readings from Last 3 Encounters:  08/24/12 226 lb (102.513 kg)  06/29/12 229 lb (103.874 kg)  05/24/12 239 lb (108.41 kg)   BP Readings from Last 3 Encounters:  08/24/12 158/70  06/29/12 156/90  05/24/12 150/70   BP 158/70  Pulse 72  Temp(Src) 98.4 F (36.9 C) (Oral)  Resp 16  Wt 226 lb (102.513 kg)  BMI 30.64 kg/m2  Current Outpatient Prescriptions on File Prior to Visit  Medication Sig Dispense Refill  . amLODipine (NORVASC) 10 MG tablet Take 1 tablet (10 mg total) by mouth daily.  90 tablet  2  . aspirin 81 MG EC tablet Take 81 mg by mouth daily.        . benazepril-hydrochlorthiazide (LOTENSIN HCT) 20-12.5 MG per tablet TAKE 2 TABLETS BY MOUTH DAILY.  60 tablet  5  . buPROPion (WELLBUTRIN SR) 150 MG 12 hr tablet TAKE 1 TABLET BY MOUTH 2 TIMES DAILY.  60 tablet  5  . Cholecalciferol (VITAMIN D3) 1000 UNIT tablet Take 1,000 Units by mouth daily.        . clonazePAM (KLONOPIN) 1 MG tablet Take 1 tablet (1 mg total) by mouth 2 (two) times daily as needed for anxiety. 1/2 tab at bedtime as needed for sleep  60 tablet  2  . clotrimazole-betamethasone (LOTRISONE) cream Apply topically 2 (two) times daily. X 1 month as needed  30 g  3  . Cyanocobalamin (VITAMIN B-12 CR) 1000 MCG TBCR Take 1,000 mcg by mouth daily.        . cyclobenzaprine (FLEXERIL) 10 MG tablet Take 1 tablet (10 mg total) by mouth 3 (three) times daily as needed.  90 tablet  5  . digoxin (LANOXIN) 0.125 MG tablet Take 1 tablet (0.125 mg total) by mouth daily.  30 tablet  11  . FLUoxetine (PROZAC) 40 MG capsule TAKE 1 CAPSULE (40 MG TOTAL) BY MOUTH DAILY.  90 capsule  2  .  gabapentin (NEURONTIN) 300 MG capsule Take 1 capsule (300 mg total) by mouth 3 (three) times daily.  90 capsule  3  . morphine (MS CONTIN) 30 MG 12 hr tablet Take 1 tablet (30 mg total) by mouth 2 (two) times daily. Please fill on or after 08/15/12. Ok to fill 1-2 days early when month has 31 days  60 tablet  0  . oxyCODONE (ROXICODONE) 15 MG immediate release tablet Take 1 tablet (15 mg total) by mouth 4 (four) times daily as needed. Fill on or after 08/13/12 .Vernon KitchenMarland KitchenOk to fill 1-2 days early when month has 31 days  120 tablet  0  . propranolol (INDERAL) 80 MG tablet TAKE 1 TABLET BY MOUTH 3 TIMES DAILY.  270 tablet  1  . Syringe/Needle, Disp, (SYRINGE 3CC/22GX1") 22G X 1" 3 ML MISC 1 each by Does not apply route every 14 (fourteen) days.  50 each  2  . testosterone cypionate (DEPOTESTOTERONE CYPIONATE) 200 MG/ML injection Inject 3 mLs (600 mg total) into the muscle every 14 (fourteen) days.  20 mL  5  . torsemide (DEMADEX) 100 MG tablet Take 1 tablet (100 mg total)  by mouth daily as needed.  30 tablet  11  . amoxicillin-clavulanate (AUGMENTIN) 875-125 MG per tablet Take 1 tablet by mouth 2 (two) times daily.  20 tablet  0   No current facility-administered medications on file prior to visit.   Past Medical History  Diagnosis Date  . Diabetes mellitus     type II  . Hypertension   . LBP (low back pain)   . Palpitations   . ED (erectile dysfunction)   . OSA on CPAP   . Hypogonadism male   . Vitamin D deficiency   . Osteoarthritis, hand   . Hyperlipidemia   . Atrial fibrillation     1992  . Depression   . PTSD (post-traumatic stress disorder)   . Osteoarthritis   . DDD (degenerative disc disease)   . DDD (degenerative disc disease)   . MVP (mitral valve prolapse)    Past Surgical History  Procedure Laterality Date  . Back surgery  1987  . Cervical laminectomy  2004    [name], rod was placed  . Appendectomy    . Cholecystectomy    . Colonoscopy    . Knee surgery      left  knee/arthroscopic    reports that he quit smoking about 41 years ago. His smoking use included Cigarettes. He has a 12 pack-year smoking history. He does not have any smokeless tobacco history on file. He reports that he drinks about 0.5 ounces of alcohol per week. He reports that he does not use illicit drugs. family history includes Allergies in his mother; Coronary artery disease in his other; Diabetes in his other; Esophageal cancer in his maternal aunt; Heart attack (age of onset: 68) in his father; Heart disease in his brother, father, and mother; Hypertension in his other; Other in his paternal grandmother; Rheum arthritis in his paternal grandmother; and Stroke in his mother.  There is no history of Colon cancer, and Rectal cancer, and Stomach cancer, . Allergies  Allergen Reactions  . Amlodipine Besylate     REACTION: swelling  . Cymbalta (Duloxetine Hcl)     constipation  . Duloxetine     REACTION: constipation  . Prednisolone     Edema, wt gain  . Temazepam      Review of Systems  Constitutional: Positive for fatigue and unexpected weight change. Negative for chills and appetite change.  HENT: Negative for nosebleeds, congestion, sore throat, sneezing, trouble swallowing and neck pain.   Eyes: Negative for itching and visual disturbance.  Respiratory: Negative for cough and shortness of breath.   Cardiovascular: Negative for chest pain, palpitations and leg swelling.  Gastrointestinal: Negative for nausea, diarrhea, blood in stool and abdominal distention.  Genitourinary: Negative for frequency and hematuria.  Musculoskeletal: Positive for back pain and arthralgias (better). Negative for joint swelling and gait problem.  Skin: Negative for rash.  Neurological: Negative for dizziness, tremors, speech difficulty, weakness, light-headedness and headaches.  Psychiatric/Behavioral: Positive for sleep disturbance and dysphoric mood. Negative for suicidal ideas and agitation. The  patient is not nervous/anxious.        Objective:   Physical Exam  Constitutional: He is oriented to person, place, and time. He appears well-developed.  obese  HENT:  Mouth/Throat: Oropharynx is clear and moist.  Eyes: Conjunctivae are normal. Pupils are equal, round, and reactive to light.  Neck: Normal range of motion. No JVD present. No thyromegaly present.  Cardiovascular: Normal rate, regular rhythm, normal heart sounds and intact distal pulses.  Exam reveals  no gallop and no friction rub.   No murmur heard. Pulmonary/Chest: Effort normal and breath sounds normal. No respiratory distress. He has no wheezes. He has no rales. He exhibits no tenderness.  Abdominal: Soft. Bowel sounds are normal. He exhibits no distension and no mass. There is no tenderness. There is no rebound and no guarding.  Musculoskeletal: Normal range of motion. He exhibits tenderness (all spine). He exhibits no edema.  L anterior prox arm sq lump  Lymphadenopathy:    He has no cervical adenopathy.  Neurological: He is alert and oriented to person, place, and time. He has normal reflexes. No cranial nerve deficit. He exhibits normal muscle tone. Coordination normal.  Skin: Skin is warm and dry. No rash noted.  Psychiatric: His behavior is normal. Judgment and thought content normal.  Not depressed, not suicidal or homicidal  LS is tender and stiff  Lab Results  Component Value Date   WBC 9.1 08/13/2010   HGB 14.4 08/13/2010   HCT 42.1 08/13/2010   PLT 291.0 08/13/2010   GLUCOSE 99 02/23/2012   CHOL 128 12/09/2009   TRIG 134.0 12/09/2009   HDL 20.40* 12/09/2009   LDLCALC 81 12/09/2009   ALT 18 08/13/2010   AST 18 08/13/2010   NA 132* 02/23/2012   K 4.2 02/23/2012   CL 95* 02/23/2012   CREATININE 1.0 02/23/2012   BUN 9 02/23/2012   CO2 32 02/23/2012   TSH 2.66 06/08/2011   PSA 0.57 12/09/2009   HGBA1C 5.3 10/16/2011          Assessment & Plan:

## 2012-08-24 NOTE — Assessment & Plan Note (Signed)
Continue with current prescription therapy as reflected on the Med list.  

## 2012-08-24 NOTE — Assessment & Plan Note (Signed)
  On diet  

## 2012-08-24 NOTE — Assessment & Plan Note (Signed)
Chronic. 

## 2012-08-24 NOTE — Assessment & Plan Note (Signed)
He will see Dr Ethelene Hal Continue with current prescription therapy as reflected on the Med list.

## 2012-10-04 ENCOUNTER — Ambulatory Visit (INDEPENDENT_AMBULATORY_CARE_PROVIDER_SITE_OTHER): Payer: Self-pay | Admitting: Internal Medicine

## 2012-10-04 ENCOUNTER — Encounter: Payer: Self-pay | Admitting: Internal Medicine

## 2012-10-04 VITALS — BP 150/80 | HR 41 | Temp 98.2°F | Wt 226.0 lb

## 2012-10-04 DIAGNOSIS — M545 Low back pain: Secondary | ICD-10-CM

## 2012-10-04 DIAGNOSIS — M542 Cervicalgia: Secondary | ICD-10-CM

## 2012-10-04 DIAGNOSIS — E785 Hyperlipidemia, unspecified: Secondary | ICD-10-CM

## 2012-10-04 DIAGNOSIS — E119 Type 2 diabetes mellitus without complications: Secondary | ICD-10-CM

## 2012-10-04 DIAGNOSIS — M2550 Pain in unspecified joint: Secondary | ICD-10-CM

## 2012-10-04 DIAGNOSIS — E538 Deficiency of other specified B group vitamins: Secondary | ICD-10-CM

## 2012-10-04 DIAGNOSIS — R001 Bradycardia, unspecified: Secondary | ICD-10-CM

## 2012-10-04 DIAGNOSIS — I498 Other specified cardiac arrhythmias: Secondary | ICD-10-CM

## 2012-10-04 DIAGNOSIS — I10 Essential (primary) hypertension: Secondary | ICD-10-CM

## 2012-10-04 DIAGNOSIS — R202 Paresthesia of skin: Secondary | ICD-10-CM

## 2012-10-04 DIAGNOSIS — R209 Unspecified disturbances of skin sensation: Secondary | ICD-10-CM

## 2012-10-04 MED ORDER — OXYCODONE HCL 15 MG PO TABS: 15.0000 mg | ORAL_TABLET | Freq: Four times a day (QID) | ORAL | Status: DC | PRN

## 2012-10-04 MED ORDER — MORPHINE SULFATE ER 30 MG PO TBCR: 30.0000 mg | EXTENDED_RELEASE_TABLET | Freq: Two times a day (BID) | ORAL | Status: DC

## 2012-10-04 NOTE — Patient Instructions (Signed)
Stop Lanoxin

## 2012-10-09 ENCOUNTER — Encounter: Payer: Self-pay | Admitting: Internal Medicine

## 2012-10-09 DIAGNOSIS — R001 Bradycardia, unspecified: Secondary | ICD-10-CM | POA: Insufficient documentation

## 2012-10-09 NOTE — Assessment & Plan Note (Signed)
Continue with current prescription therapy as reflected on the Med list.  

## 2012-10-09 NOTE — Progress Notes (Signed)
Subjective:    HPI  C/o low HR in the 50's range  F/u swelling - resolved. F/u R hip pain - long time - better now.  The patient is here to follow up on chronic depression, anxiety, headaches and chronic moderate-severe LBP and PMR symptoms. Pain is not well controlled.  He was not better on Prednisone and he stopped it. He is using a CPAP machine F/u on low Na  Wt Readings from Last 3 Encounters:  10/04/12 226 lb (102.513 kg)  08/24/12 226 lb (102.513 kg)  06/29/12 229 lb (103.874 kg)   BP Readings from Last 3 Encounters:  10/04/12 150/80  08/24/12 158/70  06/29/12 156/90   BP 150/80  Pulse 41  Temp(Src) 98.2 F (36.8 C) (Oral)  Wt 226 lb (102.513 kg)  BMI 30.64 kg/m2  SpO2 97%  Current Outpatient Prescriptions on File Prior to Visit  Medication Sig Dispense Refill  . amLODipine (NORVASC) 10 MG tablet Take 1 tablet (10 mg total) by mouth daily.  90 tablet  2  . aspirin 81 MG EC tablet Take 81 mg by mouth daily.        . benazepril-hydrochlorthiazide (LOTENSIN HCT) 20-12.5 MG per tablet TAKE 2 TABLETS BY MOUTH DAILY.  60 tablet  5  . buPROPion (WELLBUTRIN SR) 150 MG 12 hr tablet TAKE 1 TABLET BY MOUTH 2 TIMES DAILY.  60 tablet  5  . Cholecalciferol (VITAMIN D3) 1000 UNIT tablet Take 1,000 Units by mouth daily.        . clonazePAM (KLONOPIN) 1 MG tablet Take 1 tablet (1 mg total) by mouth 2 (two) times daily as needed for anxiety. 1/2 tab at bedtime as needed for sleep  60 tablet  2  . clotrimazole-betamethasone (LOTRISONE) cream Apply topically 2 (two) times daily. X 1 month as needed  30 g  3  . Cyanocobalamin (VITAMIN B-12 CR) 1000 MCG TBCR Take 1,000 mcg by mouth daily.        . cyclobenzaprine (FLEXERIL) 10 MG tablet Take 1 tablet (10 mg total) by mouth 3 (three) times daily as needed.  90 tablet  5  . FLUoxetine (PROZAC) 40 MG capsule TAKE 1 CAPSULE (40 MG TOTAL) BY MOUTH DAILY.  90 capsule  2  . gabapentin (NEURONTIN) 300 MG capsule Take 1 capsule (300 mg total) by  mouth 3 (three) times daily.  90 capsule  3  . propranolol (INDERAL) 80 MG tablet TAKE 1 TABLET BY MOUTH 3 TIMES DAILY.  270 tablet  1  . Syringe/Needle, Disp, (SYRINGE 3CC/22GX1") 22G X 1" 3 ML MISC 1 each by Does not apply route every 14 (fourteen) days.  50 each  2  . testosterone cypionate (DEPOTESTOTERONE CYPIONATE) 200 MG/ML injection Inject 3 mLs (600 mg total) into the muscle every 14 (fourteen) days.  20 mL  5  . torsemide (DEMADEX) 100 MG tablet Take 1 tablet (100 mg total) by mouth daily as needed.  30 tablet  11   No current facility-administered medications on file prior to visit.   Past Medical History  Diagnosis Date  . Diabetes mellitus     type II  . Hypertension   . LBP (low back pain)   . Palpitations   . ED (erectile dysfunction)   . OSA on CPAP   . Hypogonadism male   . Vitamin D deficiency   . Osteoarthritis, hand   . Hyperlipidemia   . Atrial fibrillation     1992  . Depression   .  PTSD (post-traumatic stress disorder)   . Osteoarthritis   . DDD (degenerative disc disease)   . DDD (degenerative disc disease)   . MVP (mitral valve prolapse)    Past Surgical History  Procedure Laterality Date  . Back surgery  1987  . Cervical laminectomy  2004    [name], rod was placed  . Appendectomy    . Cholecystectomy    . Colonoscopy    . Knee surgery      left knee/arthroscopic    reports that he quit smoking about 41 years ago. His smoking use included Cigarettes. He has a 12 pack-year smoking history. He does not have any smokeless tobacco history on file. He reports that he drinks about 0.5 ounces of alcohol per week. He reports that he does not use illicit drugs. family history includes Allergies in his mother; Coronary artery disease in his other; Diabetes in his other; Esophageal cancer in his maternal aunt; Heart attack (age of onset: 8) in his father; Heart disease in his brother, father, and mother; Hypertension in his other; Other in his paternal  grandmother; Rheum arthritis in his paternal grandmother; and Stroke in his mother.  There is no history of Colon cancer, and Rectal cancer, and Stomach cancer, . Allergies  Allergen Reactions  . Amlodipine Besylate     REACTION: swelling  . Cymbalta (Duloxetine Hcl)     constipation  . Digoxin And Related     HR 41  . Duloxetine     REACTION: constipation  . Prednisolone     Edema, wt gain  . Temazepam      Review of Systems  Constitutional: Positive for fatigue and unexpected weight change. Negative for chills and appetite change.  HENT: Negative for nosebleeds, congestion, sore throat, sneezing, trouble swallowing and neck pain.   Eyes: Negative for itching and visual disturbance.  Respiratory: Negative for cough and shortness of breath.   Cardiovascular: Negative for chest pain, palpitations and leg swelling.  Gastrointestinal: Negative for nausea, diarrhea, blood in stool and abdominal distention.  Genitourinary: Negative for frequency and hematuria.  Musculoskeletal: Positive for back pain and arthralgias (better). Negative for joint swelling and gait problem.  Skin: Negative for rash.  Neurological: Negative for dizziness, tremors, speech difficulty, weakness, light-headedness and headaches.  Psychiatric/Behavioral: Positive for sleep disturbance and dysphoric mood. Negative for suicidal ideas and agitation. The patient is not nervous/anxious.        Objective:   Physical Exam  Constitutional: He is oriented to person, place, and time. He appears well-developed.  obese  HENT:  Mouth/Throat: Oropharynx is clear and moist.  Eyes: Conjunctivae are normal. Pupils are equal, round, and reactive to light.  Neck: Normal range of motion. No JVD present. No thyromegaly present.  Cardiovascular: Regular rhythm, normal heart sounds and intact distal pulses.  Exam reveals no gallop and no friction rub.   No murmur heard. brady  Pulmonary/Chest: Effort normal and breath sounds  normal. No respiratory distress. He has no wheezes. He has no rales. He exhibits no tenderness.  Abdominal: Soft. Bowel sounds are normal. He exhibits no distension and no mass. There is no tenderness. There is no rebound and no guarding.  Musculoskeletal: Normal range of motion. He exhibits tenderness (all spine). He exhibits no edema.  L anterior prox arm sq lump  Lymphadenopathy:    He has no cervical adenopathy.  Neurological: He is alert and oriented to person, place, and time. He has normal reflexes. No cranial nerve deficit. He exhibits  normal muscle tone. Coordination normal.  Skin: Skin is warm and dry. No rash noted.  Psychiatric: His behavior is normal. Judgment and thought content normal.  Not depressed, not suicidal or homicidal  LS is tender and stiff  Lab Results  Component Value Date   WBC 9.1 08/13/2010   HGB 14.4 08/13/2010   HCT 42.1 08/13/2010   PLT 291.0 08/13/2010   GLUCOSE 99 02/23/2012   CHOL 128 12/09/2009   TRIG 134.0 12/09/2009   HDL 20.40* 12/09/2009   LDLCALC 81 12/09/2009   ALT 18 08/13/2010   AST 18 08/13/2010   NA 132* 02/23/2012   K 4.2 02/23/2012   CL 95* 02/23/2012   CREATININE 1.0 02/23/2012   BUN 9 02/23/2012   CO2 32 02/23/2012   TSH 2.66 06/08/2011   PSA 0.57 12/09/2009   HGBA1C 5.3 10/16/2011          Assessment & Plan:

## 2012-10-09 NOTE — Assessment & Plan Note (Signed)
D/c Lanoxin

## 2012-10-17 ENCOUNTER — Telehealth: Payer: Self-pay | Admitting: *Deleted

## 2012-10-17 NOTE — Telephone Encounter (Signed)
OK to fill this prescription with additional refills x5 Thank you!  

## 2012-10-17 NOTE — Telephone Encounter (Signed)
Rf req for Testosterone cyp 200 mg/ml. inj 3 ml q 14 days. Ok to Rf?

## 2012-10-18 MED ORDER — TESTOSTERONE CYPIONATE 200 MG/ML IM SOLN: 600.0000 mg | INTRAMUSCULAR | Status: DC

## 2012-10-18 NOTE — Telephone Encounter (Signed)
Done

## 2012-10-24 ENCOUNTER — Ambulatory Visit: Payer: Medicare Other | Admitting: Internal Medicine

## 2012-11-01 ENCOUNTER — Encounter: Payer: Self-pay | Admitting: Internal Medicine

## 2012-11-01 ENCOUNTER — Ambulatory Visit (INDEPENDENT_AMBULATORY_CARE_PROVIDER_SITE_OTHER): Payer: Self-pay | Admitting: Internal Medicine

## 2012-11-01 ENCOUNTER — Other Ambulatory Visit (INDEPENDENT_AMBULATORY_CARE_PROVIDER_SITE_OTHER): Payer: Medicare Other

## 2012-11-01 VITALS — BP 130/48 | HR 80 | Temp 98.0°F | Resp 16 | Wt 226.0 lb

## 2012-11-01 DIAGNOSIS — I4891 Unspecified atrial fibrillation: Secondary | ICD-10-CM

## 2012-11-01 DIAGNOSIS — I10 Essential (primary) hypertension: Secondary | ICD-10-CM

## 2012-11-01 DIAGNOSIS — E538 Deficiency of other specified B group vitamins: Secondary | ICD-10-CM

## 2012-11-01 DIAGNOSIS — M545 Low back pain, unspecified: Secondary | ICD-10-CM

## 2012-11-01 DIAGNOSIS — R209 Unspecified disturbances of skin sensation: Secondary | ICD-10-CM

## 2012-11-01 DIAGNOSIS — R202 Paresthesia of skin: Secondary | ICD-10-CM

## 2012-11-01 DIAGNOSIS — E119 Type 2 diabetes mellitus without complications: Secondary | ICD-10-CM

## 2012-11-01 DIAGNOSIS — E785 Hyperlipidemia, unspecified: Secondary | ICD-10-CM

## 2012-11-01 DIAGNOSIS — M542 Cervicalgia: Secondary | ICD-10-CM

## 2012-11-01 DIAGNOSIS — M2550 Pain in unspecified joint: Secondary | ICD-10-CM

## 2012-11-01 LAB — CBC WITH DIFFERENTIAL/PLATELET
Basophils Absolute: 0.1 10*3/uL (ref 0.0–0.1)
Eosinophils Absolute: 0.1 10*3/uL (ref 0.0–0.7)
HCT: 48 % (ref 39.0–52.0)
Hemoglobin: 16.1 g/dL (ref 13.0–17.0)
Lymphocytes Relative: 17.7 % (ref 12.0–46.0)
Lymphs Abs: 1.6 10*3/uL (ref 0.7–4.0)
MCHC: 33.5 g/dL (ref 30.0–36.0)
Neutro Abs: 6.5 10*3/uL (ref 1.4–7.7)
RDW: 14.7 % — ABNORMAL HIGH (ref 11.5–14.6)

## 2012-11-01 MED ORDER — OXYCODONE HCL 15 MG PO TABS: 15.0000 mg | ORAL_TABLET | Freq: Four times a day (QID) | ORAL | Status: DC | PRN

## 2012-11-01 MED ORDER — MORPHINE SULFATE ER 30 MG PO TBCR: 30.0000 mg | EXTENDED_RELEASE_TABLET | Freq: Two times a day (BID) | ORAL | Status: DC

## 2012-11-01 NOTE — Progress Notes (Signed)
Subjective:    HPI C/o lightheadedness when getting up. HR - mid-40s F/u low HR  F/u swelling - resolved. F/u R hip pain - long time - better now.  The patient is here to follow up on chronic depression, anxiety, headaches and chronic moderate-severe LBP and PMR symptoms. Pain is not well controlled.  He was not better on Prednisone and he stopped it. He is using a CPAP machine  F/u on low Na  Wt Readings from Last 3 Encounters:  11/01/12 226 lb (102.513 kg)  10/04/12 226 lb (102.513 kg)  08/24/12 226 lb (102.513 kg)   BP Readings from Last 3 Encounters:  11/01/12 130/48  10/04/12 150/80  08/24/12 158/70   BP 130/48  Pulse 80  Temp(Src) 98 F (36.7 C) (Oral)  Resp 16  Wt 226 lb (102.513 kg)  BMI 30.64 kg/m2  Current Outpatient Prescriptions on File Prior to Visit  Medication Sig Dispense Refill  . amLODipine (NORVASC) 10 MG tablet Take 1 tablet (10 mg total) by mouth daily.  90 tablet  2  . aspirin 81 MG EC tablet Take 81 mg by mouth daily.        . benazepril-hydrochlorthiazide (LOTENSIN HCT) 20-12.5 MG per tablet TAKE 2 TABLETS BY MOUTH DAILY.  60 tablet  5  . buPROPion (WELLBUTRIN SR) 150 MG 12 hr tablet TAKE 1 TABLET BY MOUTH 2 TIMES DAILY.  60 tablet  5  . Cholecalciferol (VITAMIN D3) 1000 UNIT tablet Take 1,000 Units by mouth daily.        . clonazePAM (KLONOPIN) 1 MG tablet Take 1 tablet (1 mg total) by mouth 2 (two) times daily as needed for anxiety. 1/2 tab at bedtime as needed for sleep  60 tablet  2  . clotrimazole-betamethasone (LOTRISONE) cream Apply topically 2 (two) times daily. X 1 month as needed  30 g  3  . Cyanocobalamin (VITAMIN B-12 CR) 1000 MCG TBCR Take 1,000 mcg by mouth daily.        . cyclobenzaprine (FLEXERIL) 10 MG tablet Take 1 tablet (10 mg total) by mouth 3 (three) times daily as needed.  90 tablet  5  . FLUoxetine (PROZAC) 40 MG capsule TAKE 1 CAPSULE (40 MG TOTAL) BY MOUTH DAILY.  90 capsule  2  . gabapentin (NEURONTIN) 300 MG capsule  Take 1 capsule (300 mg total) by mouth 3 (three) times daily.  90 capsule  3  . morphine (MS CONTIN) 30 MG 12 hr tablet Take 1 tablet (30 mg total) by mouth 2 (two) times daily. Please fill on or after 10/15/12. Ok to fill every 30 days  60 tablet  0  . oxyCODONE (ROXICODONE) 15 MG immediate release tablet Take 1 tablet (15 mg total) by mouth 4 (four) times daily as needed. Fill on or after 10/13/12 .Marland KitchenMarland KitchenOk to fill every 30 days  120 tablet  0  . propranolol (INDERAL) 80 MG tablet TAKE 1 TABLET BY MOUTH 3 TIMES DAILY.  270 tablet  1  . Syringe/Needle, Disp, (SYRINGE 3CC/22GX1") 22G X 1" 3 ML MISC 1 each by Does not apply route every 14 (fourteen) days.  50 each  2  . testosterone cypionate (DEPOTESTOTERONE CYPIONATE) 200 MG/ML injection Inject 3 mLs (600 mg total) into the muscle every 14 (fourteen) days.  20 mL  5  . torsemide (DEMADEX) 100 MG tablet Take 1 tablet (100 mg total) by mouth daily as needed.  30 tablet  11   No current facility-administered medications on file prior  to visit.   Past Medical History  Diagnosis Date  . Diabetes mellitus     type II  . Hypertension   . LBP (low back pain)   . Palpitations   . ED (erectile dysfunction)   . OSA on CPAP   . Hypogonadism male   . Vitamin D deficiency   . Osteoarthritis, hand   . Hyperlipidemia   . Atrial fibrillation     1992  . Depression   . PTSD (post-traumatic stress disorder)   . Osteoarthritis   . DDD (degenerative disc disease)   . DDD (degenerative disc disease)   . MVP (mitral valve prolapse)    Past Surgical History  Procedure Laterality Date  . Back surgery  1987  . Cervical laminectomy  2004    Vernon Bishop, rod was placed  . Appendectomy    . Cholecystectomy    . Colonoscopy    . Knee surgery      left knee/arthroscopic    reports that he quit smoking about 41 years ago. His smoking use included Cigarettes. He has a 12 pack-year smoking history. He does not have any smokeless tobacco history on file. He reports  that he drinks about 0.5 ounces of alcohol per week. He reports that he does not use illicit drugs. family history includes Allergies in his mother; Coronary artery disease in his other; Diabetes in his other; Esophageal cancer in his maternal aunt; Heart attack (age of onset: 42) in his father; Heart disease in his brother, father, and mother; Hypertension in his other; Other in his paternal grandmother; Rheum arthritis in his paternal grandmother; and Stroke in his mother.  There is no history of Colon cancer, and Rectal cancer, and Stomach cancer, . Allergies  Allergen Reactions  . Amlodipine Besylate     REACTION: swelling  . Cymbalta (Duloxetine Hcl)     constipation  . Digoxin And Related     HR 41  . Duloxetine     REACTION: constipation  . Prednisolone     Edema, wt gain  . Temazepam      Review of Systems  Constitutional: Positive for fatigue and unexpected weight change. Negative for chills and appetite change.  HENT: Negative for nosebleeds, congestion, sore throat, sneezing, trouble swallowing and neck pain.   Eyes: Negative for itching and visual disturbance.  Respiratory: Negative for cough and shortness of breath.   Cardiovascular: Negative for chest pain, palpitations and leg swelling.  Gastrointestinal: Negative for nausea, diarrhea, blood in stool and abdominal distention.  Genitourinary: Negative for frequency and hematuria.  Musculoskeletal: Positive for back pain and arthralgias (better). Negative for joint swelling and gait problem.  Skin: Negative for rash.  Neurological: Negative for dizziness, tremors, speech difficulty, weakness, light-headedness and headaches.  Psychiatric/Behavioral: Positive for sleep disturbance and dysphoric mood. Negative for suicidal ideas and agitation. The patient is not nervous/anxious.        Objective:   Physical Exam  Constitutional: He is oriented to person, place, and time. He appears well-developed.  obese  HENT:   Mouth/Throat: Oropharynx is clear and moist.  Eyes: Conjunctivae are normal. Pupils are equal, round, and reactive to light.  Neck: Normal range of motion. No JVD present. No thyromegaly present.  Cardiovascular: Regular rhythm, normal heart sounds and intact distal pulses.  Exam reveals no gallop and no friction rub.   No murmur heard. brady  Pulmonary/Chest: Effort normal and breath sounds normal. No respiratory distress. He has no wheezes. He has no rales. He  exhibits no tenderness.  Abdominal: Soft. Bowel sounds are normal. He exhibits no distension and no mass. There is no tenderness. There is no rebound and no guarding.  Musculoskeletal: Normal range of motion. He exhibits tenderness (all spine). He exhibits no edema.  L anterior prox arm sq lump  Lymphadenopathy:    He has no cervical adenopathy.  Neurological: He is alert and oriented to person, place, and time. He has normal reflexes. No cranial nerve deficit. He exhibits normal muscle tone. Coordination normal.  Skin: Skin is warm and dry. No rash noted.  Psychiatric: His behavior is normal. Judgment and thought content normal.  Not depressed, not suicidal or homicidal  LS is tender and stiff  Lab Results  Component Value Date   WBC 9.1 08/13/2010   HGB 14.4 08/13/2010   HCT 42.1 08/13/2010   PLT 291.0 08/13/2010   GLUCOSE 99 02/23/2012   CHOL 128 12/09/2009   TRIG 134.0 12/09/2009   HDL 20.40* 12/09/2009   LDLCALC 81 12/09/2009   ALT 18 08/13/2010   AST 18 08/13/2010   NA 132* 02/23/2012   K 4.2 02/23/2012   CL 95* 02/23/2012   CREATININE 1.0 02/23/2012   BUN 9 02/23/2012   CO2 32 02/23/2012   TSH 2.66 06/08/2011   PSA 0.57 12/09/2009   HGBA1C 5.3 10/16/2011          Assessment & Plan:

## 2012-11-01 NOTE — Assessment & Plan Note (Signed)
Continue with current prescription therapy as reflected on the Med list.  

## 2012-11-01 NOTE — Assessment & Plan Note (Signed)
Continue with current prescription therapy as reflected on the Med list. Dr Ethelene Hal consult

## 2012-11-01 NOTE — Assessment & Plan Note (Signed)
Continue with current prescription therapy as reflected on the Med list. Off digoxin

## 2012-11-02 LAB — BASIC METABOLIC PANEL
BUN: 11 mg/dL (ref 6–23)
Calcium: 9.6 mg/dL (ref 8.4–10.5)
Chloride: 100 mEq/L (ref 96–112)
Glucose, Bld: 93 mg/dL (ref 70–99)
Potassium: 4.4 mEq/L (ref 3.5–5.1)

## 2012-11-02 LAB — TSH: TSH: 0.7 u[IU]/mL (ref 0.35–5.50)

## 2012-11-02 LAB — VITAMIN B12: Vitamin B-12: 232 pg/mL (ref 211–911)

## 2012-11-02 LAB — MAGNESIUM: Magnesium: 2.3 mg/dL (ref 1.5–2.5)

## 2012-11-02 LAB — HEPATIC FUNCTION PANEL: Albumin: 4 g/dL (ref 3.5–5.2)

## 2012-11-07 ENCOUNTER — Telehealth: Payer: Self-pay | Admitting: *Deleted

## 2012-11-07 MED ORDER — BENAZEPRIL-HYDROCHLOROTHIAZIDE 20-12.5 MG PO TABS: ORAL_TABLET | ORAL | Status: DC

## 2012-11-07 NOTE — Telephone Encounter (Signed)
Refill done.  

## 2012-11-08 ENCOUNTER — Other Ambulatory Visit: Payer: Self-pay | Admitting: *Deleted

## 2012-11-08 MED ORDER — CLOTRIMAZOLE-BETAMETHASONE 1-0.05 % EX CREA: TOPICAL_CREAM | Freq: Two times a day (BID) | CUTANEOUS | Status: DC

## 2012-11-14 ENCOUNTER — Telehealth: Payer: Self-pay | Admitting: *Deleted

## 2012-11-14 NOTE — Telephone Encounter (Signed)
Pharmacist called requesting an early refill on the Morphine Rx.  Pt states to pharmacy that he is going out of town and would like refill today. Please advise

## 2012-11-14 NOTE — Telephone Encounter (Signed)
Called [facility] advised MD said ok.

## 2012-11-14 NOTE — Telephone Encounter (Signed)
Ok Thx 

## 2012-11-25 ENCOUNTER — Ambulatory Visit (INDEPENDENT_AMBULATORY_CARE_PROVIDER_SITE_OTHER): Payer: Medicare Other | Admitting: Pulmonary Disease

## 2012-11-25 ENCOUNTER — Encounter: Payer: Self-pay | Admitting: Pulmonary Disease

## 2012-11-25 VITALS — BP 148/72 | HR 50 | Temp 99.6°F | Ht 72.0 in | Wt 234.0 lb

## 2012-11-25 DIAGNOSIS — G4733 Obstructive sleep apnea (adult) (pediatric): Secondary | ICD-10-CM

## 2012-11-25 NOTE — Assessment & Plan Note (Signed)
The patient is doing well on CPAP set on the automatic mode.  He is having no issues with his mask fit or pressure, and feels that his daytime alertness is adequate.  I have encouraged him to work aggressively on weight loss, and also to keep up with his mask changes and supplies.

## 2012-11-25 NOTE — Progress Notes (Signed)
  Subjective:    Patient ID: Vernon Bishop, male    DOB: 1940-09-13, 72 y.o.   MRN: [id number]  HPI Patient comes in today for followup of his obstructive sleep apnea.  He is wearing CPAP compliant, and is having no issues with the mask fit or pressure.  He has chosen to stay on the automatic setting, and feels that he is doing well with this.  He feels that it helps his sleep as much as it possibly can, but continues to have issues with awakenings because of chronic pain.  His daytime alertness is adequate.   Review of Systems  Constitutional: Negative for fever and unexpected weight change.  HENT: Negative for ear pain, nosebleeds, congestion, sore throat, rhinorrhea, sneezing, trouble swallowing, dental problem, postnasal drip and sinus pressure.   Eyes: Negative for redness and itching.  Respiratory: Negative for cough, chest tightness, shortness of breath and wheezing.   Cardiovascular: Negative for palpitations and leg swelling.  Gastrointestinal: Negative for nausea and vomiting.  Genitourinary: Negative for dysuria.  Musculoskeletal: Negative for joint swelling.  Skin: Negative for rash.  Neurological: Negative for headaches.  Hematological: Does not bruise/bleed easily.  Psychiatric/Behavioral: Negative for dysphoric mood. The patient is not nervous/anxious.        Objective:   Physical Exam Overweight male in no acute distress Nose without purulence or discharge noted Neck without lymphadenopathy or thyromegaly No skin breakdown or pressure necrosis from the CPAP mask Lower extremities without edema, no cyanosis Alert and oriented, does not appear to be sleepy, moves all 4 extremities.       Assessment & Plan:

## 2012-11-25 NOTE — Patient Instructions (Addendum)
Continue with cpap, and keep up with mask changes and supplies. Work on weight loss followup with me in one year.  

## 2012-12-19 ENCOUNTER — Telehealth: Payer: Self-pay | Admitting: *Deleted

## 2012-12-19 NOTE — Telephone Encounter (Signed)
Rf req for Clonazepam 1 mg 1 po bid prn anxiety,  and 1/2 po qhs prn sleep. #60. Last filled 10/31/12. Ok to Rf?

## 2012-12-20 MED ORDER — CLONAZEPAM 1 MG PO TABS: 1.0000 mg | ORAL_TABLET | Freq: Two times a day (BID) | ORAL | Status: DC | PRN

## 2012-12-20 NOTE — Telephone Encounter (Signed)
OK to fill this prescription with additional refills x2 Thank you!  

## 2012-12-20 NOTE — Telephone Encounter (Signed)
Done

## 2013-01-03 ENCOUNTER — Ambulatory Visit (INDEPENDENT_AMBULATORY_CARE_PROVIDER_SITE_OTHER): Payer: Medicare Other | Admitting: Internal Medicine

## 2013-01-03 ENCOUNTER — Encounter: Payer: Self-pay | Admitting: Internal Medicine

## 2013-01-03 VITALS — BP 128/90 | HR 50 | Temp 98.1°F | Ht 72.0 in | Wt 231.0 lb

## 2013-01-03 DIAGNOSIS — Z23 Encounter for immunization: Secondary | ICD-10-CM

## 2013-01-03 DIAGNOSIS — I10 Essential (primary) hypertension: Secondary | ICD-10-CM

## 2013-01-03 DIAGNOSIS — M545 Low back pain: Secondary | ICD-10-CM

## 2013-01-03 DIAGNOSIS — M542 Cervicalgia: Secondary | ICD-10-CM

## 2013-01-03 DIAGNOSIS — E785 Hyperlipidemia, unspecified: Secondary | ICD-10-CM

## 2013-01-03 DIAGNOSIS — R202 Paresthesia of skin: Secondary | ICD-10-CM

## 2013-01-03 DIAGNOSIS — E119 Type 2 diabetes mellitus without complications: Secondary | ICD-10-CM

## 2013-01-03 DIAGNOSIS — R209 Unspecified disturbances of skin sensation: Secondary | ICD-10-CM

## 2013-01-03 DIAGNOSIS — M2550 Pain in unspecified joint: Secondary | ICD-10-CM

## 2013-01-03 DIAGNOSIS — E538 Deficiency of other specified B group vitamins: Secondary | ICD-10-CM

## 2013-01-03 MED ORDER — OXYCODONE HCL 15 MG PO TABS: 15.0000 mg | ORAL_TABLET | Freq: Four times a day (QID) | ORAL | Status: DC | PRN

## 2013-01-03 MED ORDER — MORPHINE SULFATE ER 30 MG PO TBCR: 30.0000 mg | EXTENDED_RELEASE_TABLET | Freq: Two times a day (BID) | ORAL | Status: DC

## 2013-01-03 NOTE — Assessment & Plan Note (Signed)
Continue with current prescription therapy as reflected on the Med list.  

## 2013-01-03 NOTE — Progress Notes (Signed)
Subjective:    HPI F/u lightheadedness when getting up - resolved. HR - mid-50s F/u low HR  F/u swelling - resolved. F/u R hip pain - long time - better now.  The patient is here to follow up on chronic depression, anxiety, headaches and chronic moderate-severe LBP - worse and PMR symptoms. Pain is not well controlled.  He was not better on Prednisone and he stopped it. He is using a CPAP machine  F/u on low Na  Wt Readings from Last 3 Encounters:  01/03/13 231 lb (104.781 kg)  11/25/12 234 lb (106.142 kg)  11/01/12 226 lb (102.513 kg)   BP Readings from Last 3 Encounters:  01/03/13 128/90  11/25/12 148/72  11/01/12 130/48   BP 128/90  Pulse 50  Temp(Src) 98.1 F (36.7 C) (Oral)  Ht 6' (1.829 m)  Wt 231 lb (104.781 kg)  BMI 31.32 kg/m2  SpO2 97%  Current Outpatient Prescriptions on File Prior to Visit  Medication Sig Dispense Refill  . amLODipine (NORVASC) 10 MG tablet Take 1 tablet (10 mg total) by mouth daily.  90 tablet  2  . aspirin 81 MG EC tablet Take 81 mg by mouth daily.        . benazepril-hydrochlorthiazide (LOTENSIN HCT) 20-12.5 MG per tablet TAKE 2 TABLETS BY MOUTH DAILY.  60 tablet  5  . buPROPion (WELLBUTRIN SR) 150 MG 12 hr tablet TAKE 1 TABLET BY MOUTH 2 TIMES DAILY.  60 tablet  5  . Cholecalciferol (VITAMIN D3) 1000 UNIT tablet Take 1,000 Units by mouth daily.        . clonazePAM (KLONOPIN) 1 MG tablet Take 1 tablet (1 mg total) by mouth 2 (two) times daily as needed for anxiety. 1/2 tab at bedtime as needed for sleep  60 tablet  2  . clotrimazole-betamethasone (LOTRISONE) cream Apply topically 2 (two) times daily. X 1 month as needed  30 g  1  . Cyanocobalamin (VITAMIN B-12 CR) 1000 MCG TBCR Take 1,000 mcg by mouth daily.        . cyclobenzaprine (FLEXERIL) 10 MG tablet Take 1 tablet (10 mg total) by mouth 3 (three) times daily as needed.  90 tablet  5  . FLUoxetine (PROZAC) 40 MG capsule TAKE 1 CAPSULE (40 MG TOTAL) BY MOUTH DAILY.  90 capsule  2  .  gabapentin (NEURONTIN) 300 MG capsule Take 1 capsule (300 mg total) by mouth 3 (three) times daily.  90 capsule  3  . morphine (MS CONTIN) 30 MG 12 hr tablet Take 1 tablet (30 mg total) by mouth 2 (two) times daily. Please fill on or after 12/16/12. Ok to fill every 30 days  60 tablet  0  . oxyCODONE (ROXICODONE) 15 MG immediate release tablet Take 1 tablet (15 mg total) by mouth 4 (four) times daily as needed. Fill on or after 12/14/12 .Marland KitchenMarland KitchenOk to fill every 30 days  120 tablet  0  . propranolol (INDERAL) 80 MG tablet TAKE 1 TABLET BY MOUTH 3 TIMES DAILY.  270 tablet  1  . Syringe/Needle, Disp, (SYRINGE 3CC/22GX1") 22G X 1" 3 ML MISC 1 each by Does not apply route every 14 (fourteen) days.  50 each  2  . testosterone cypionate (DEPOTESTOTERONE CYPIONATE) 200 MG/ML injection Inject 3 mLs (600 mg total) into the muscle every 14 (fourteen) days.  20 mL  5  . torsemide (DEMADEX) 100 MG tablet Take 1 tablet (100 mg total) by mouth daily as needed.  30 tablet  11  No current facility-administered medications on file prior to visit.   Past Medical History  Diagnosis Date  . Diabetes mellitus     type II  . Hypertension   . LBP (low back pain)   . Palpitations   . ED (erectile dysfunction)   . OSA on CPAP   . Hypogonadism male   . Vitamin D deficiency   . Osteoarthritis, hand   . Hyperlipidemia   . Atrial fibrillation     1992  . Depression   . PTSD (post-traumatic stress disorder)   . Osteoarthritis   . DDD (degenerative disc disease)   . DDD (degenerative disc disease)   . MVP (mitral valve prolapse)    Past Surgical History  Procedure Laterality Date  . Back surgery  1987  . Cervical laminectomy  2004    [name], rod was placed  . Appendectomy    . Cholecystectomy    . Colonoscopy    . Knee surgery      left knee/arthroscopic    reports that he quit smoking about 41 years ago. His smoking use included Cigarettes. He has a 12 pack-year smoking history. He does not have any  smokeless tobacco history on file. He reports that he drinks about 0.5 ounces of alcohol per week. He reports that he does not use illicit drugs. family history includes Allergies in his mother; Coronary artery disease in his other; Diabetes in his other; Esophageal cancer in his maternal aunt; Heart attack (age of onset: 72) in his father; Heart disease in his brother, father, and mother; Hypertension in his other; Other in his paternal grandmother; Rheum arthritis in his paternal grandmother; Stroke in his mother. There is no history of Colon cancer, Rectal cancer, or Stomach cancer. Allergies  Allergen Reactions  . Amlodipine Besylate     REACTION: swelling  . Cymbalta [Duloxetine Hcl]     constipation  . Digoxin And Related     HR 41  . Duloxetine     REACTION: constipation  . Prednisolone     Edema, wt gain  . Temazepam      Review of Systems  Constitutional: Positive for fatigue and unexpected weight change. Negative for chills and appetite change.  HENT: Negative for nosebleeds, congestion, sore throat, sneezing, trouble swallowing and neck pain.   Eyes: Negative for itching and visual disturbance.  Respiratory: Negative for cough and shortness of breath.   Cardiovascular: Negative for chest pain, palpitations and leg swelling.  Gastrointestinal: Negative for nausea, diarrhea, blood in stool and abdominal distention.  Genitourinary: Negative for frequency and hematuria.  Musculoskeletal: Positive for back pain and arthralgias (better). Negative for joint swelling and gait problem.  Skin: Negative for rash.  Neurological: Negative for dizziness, tremors, speech difficulty, weakness, light-headedness and headaches.  Psychiatric/Behavioral: Positive for sleep disturbance and dysphoric mood. Negative for suicidal ideas and agitation. The patient is not nervous/anxious.        Objective:   Physical Exam  Constitutional: He is oriented to person, place, and time. He appears  well-developed.  obese  HENT:  Mouth/Throat: Oropharynx is clear and moist.  Eyes: Conjunctivae are normal. Pupils are equal, round, and reactive to light.  Neck: Normal range of motion. No JVD present. No thyromegaly present.  Cardiovascular: Regular rhythm, normal heart sounds and intact distal pulses.  Exam reveals no gallop and no friction rub.   No murmur heard. brady  Pulmonary/Chest: Effort normal and breath sounds normal. No respiratory distress. He has no wheezes. He has  no rales. He exhibits no tenderness.  Abdominal: Soft. Bowel sounds are normal. He exhibits no distension and no mass. There is no tenderness. There is no rebound and no guarding.  Musculoskeletal: Normal range of motion. He exhibits tenderness (all spine). He exhibits no edema.  L anterior prox arm sq lump  Lymphadenopathy:    He has no cervical adenopathy.  Neurological: He is alert and oriented to person, place, and time. He has normal reflexes. No cranial nerve deficit. He exhibits normal muscle tone. Coordination normal.  Skin: Skin is warm and dry. No rash noted.  Psychiatric: His behavior is normal. Judgment and thought content normal.  Not depressed, not suicidal or homicidal  LS is tender and stiff  Lab Results  Component Value Date   WBC 8.9 11/01/2012   HGB 16.1 11/01/2012   HCT 48.0 11/01/2012   PLT 237.0 11/01/2012   GLUCOSE 93 11/01/2012   CHOL 128 12/09/2009   TRIG 134.0 12/09/2009   HDL 20.40* 12/09/2009   LDLCALC 81 12/09/2009   ALT 19 11/01/2012   AST 21 11/01/2012   NA 137 11/01/2012   K 4.4 11/01/2012   CL 100 11/01/2012   CREATININE 0.9 11/01/2012   BUN 11 11/01/2012   CO2 29 11/01/2012   TSH 0.70 11/01/2012   PSA 0.57 12/09/2009   HGBA1C 5.3 10/16/2011          Assessment & Plan:

## 2013-01-03 NOTE — Assessment & Plan Note (Signed)
NS consult is pending Continue with current prescription therapy as reflected on the Med list.

## 2013-01-20 ENCOUNTER — Other Ambulatory Visit: Payer: Self-pay | Admitting: Internal Medicine

## 2013-01-20 MED ORDER — TIZANIDINE HCL 4 MG PO TABS: 4.0000 mg | ORAL_TABLET | Freq: Four times a day (QID) | ORAL | Status: DC | PRN

## 2013-01-20 NOTE — Telephone Encounter (Signed)
Ok for change to tizanidine prn

## 2013-01-20 NOTE — Telephone Encounter (Signed)
MD sent erx

## 2013-01-20 NOTE — Telephone Encounter (Signed)
Done hardcopy to robin  

## 2013-01-20 NOTE — Addendum Note (Signed)
Addended by: Corwin Levins on: 01/20/2013 05:36 PM   Modules accepted: Orders, Medications

## 2013-01-20 NOTE — Telephone Encounter (Signed)
Received fax from pharmacy that plan prefers Baclofen and Tizanidine.

## 2013-02-13 ENCOUNTER — Other Ambulatory Visit: Payer: Self-pay | Admitting: Internal Medicine

## 2013-02-15 ENCOUNTER — Other Ambulatory Visit: Payer: Self-pay | Admitting: Internal Medicine

## 2013-03-03 ENCOUNTER — Encounter (HOSPITAL_COMMUNITY): Payer: Self-pay | Admitting: Pharmacy Technician

## 2013-03-06 ENCOUNTER — Ambulatory Visit (INDEPENDENT_AMBULATORY_CARE_PROVIDER_SITE_OTHER): Payer: Medicare Other | Admitting: Internal Medicine

## 2013-03-06 ENCOUNTER — Encounter: Payer: Self-pay | Admitting: Internal Medicine

## 2013-03-06 VITALS — BP 150/78 | HR 80 | Temp 98.5°F | Resp 16 | Wt 226.0 lb

## 2013-03-06 DIAGNOSIS — I10 Essential (primary) hypertension: Secondary | ICD-10-CM

## 2013-03-06 DIAGNOSIS — M545 Low back pain: Secondary | ICD-10-CM

## 2013-03-06 DIAGNOSIS — M169 Osteoarthritis of hip, unspecified: Secondary | ICD-10-CM | POA: Insufficient documentation

## 2013-03-06 DIAGNOSIS — M1611 Unilateral primary osteoarthritis, right hip: Secondary | ICD-10-CM

## 2013-03-06 DIAGNOSIS — F329 Major depressive disorder, single episode, unspecified: Secondary | ICD-10-CM

## 2013-03-06 DIAGNOSIS — E538 Deficiency of other specified B group vitamins: Secondary | ICD-10-CM

## 2013-03-06 DIAGNOSIS — E119 Type 2 diabetes mellitus without complications: Secondary | ICD-10-CM

## 2013-03-06 MED ORDER — OXYCODONE HCL 15 MG PO TABS: 15.0000 mg | ORAL_TABLET | Freq: Four times a day (QID) | ORAL | Status: DC

## 2013-03-06 MED ORDER — MORPHINE SULFATE ER 30 MG PO TBCR: 30.0000 mg | EXTENDED_RELEASE_TABLET | Freq: Two times a day (BID) | ORAL | Status: DC

## 2013-03-06 MED ORDER — AMLODIPINE BESYLATE 10 MG PO TABS: 10.0000 mg | ORAL_TABLET | Freq: Every morning | ORAL | Status: DC

## 2013-03-06 NOTE — Patient Instructions (Signed)
Wt Readings from Last 3 Encounters:  03/06/13 226 lb (102.513 kg)  01/03/13 231 lb (104.781 kg)  11/25/12 234 lb (106.142 kg)    BP Readings from Last 3 Encounters:  03/06/13 150/78  01/03/13 128/90  11/25/12 148/72

## 2013-03-06 NOTE — Assessment & Plan Note (Signed)
He is to have a THR R 03/14/13 -- Dr Charlann Boxer Continue with current prescription therapy as reflected on the Med list.

## 2013-03-06 NOTE — Assessment & Plan Note (Signed)
Continue with current prescription therapy as reflected on the Med list.  

## 2013-03-06 NOTE — Progress Notes (Signed)
Pre visit review using our clinic review tool, if applicable. No additional management support is needed unless otherwise documented below in the visit note. 

## 2013-03-06 NOTE — Assessment & Plan Note (Signed)
He is to have a THR R 03/14/13 -- Dr Charlann Boxer

## 2013-03-06 NOTE — Progress Notes (Signed)
Subjective:    HPI  He is to have a THR R 03/14/13 -- Dr Charlann Boxer  F/u lightheadedness when getting up - resolved. HR - mid-50s F/u low HR  F/u swelling - resolved. F/u R hip pain - long time - better now.  The patient is here to follow up on chronic depression, anxiety, headaches and chronic moderate-severe LBP - worse and PMR symptoms. Pain is not well controlled.  He was not better on Prednisone and he stopped it. He is using a CPAP machine  F/u on low Na  Wt Readings from Last 3 Encounters:  03/06/13 226 lb (102.513 kg)  01/03/13 231 lb (104.781 kg)  11/25/12 234 lb (106.142 kg)   BP Readings from Last 3 Encounters:  03/06/13 150/78  01/03/13 128/90  11/25/12 148/72   BP 150/78  Pulse 80  Temp(Src) 98.5 F (36.9 C) (Oral)  Resp 16  Wt 226 lb (102.513 kg)  Current Outpatient Prescriptions on File Prior to Visit  Medication Sig Dispense Refill  . amLODipine (NORVASC) 10 MG tablet Take 10 mg by mouth every morning.      Marland Kitchen aspirin 81 MG EC tablet Take 81 mg by mouth daily.        Marland Kitchen buPROPion (WELLBUTRIN SR) 150 MG 12 hr tablet Take 150 mg by mouth 2 (two) times daily.      . Cholecalciferol (VITAMIN D3) 1000 UNIT tablet Take 1,000 Units by mouth daily.        . clonazePAM (KLONOPIN) 1 MG tablet Take 1 mg by mouth 2 (two) times daily.      . clotrimazole-betamethasone (LOTRISONE) cream Apply 1 application topically 2 (two) times daily.      . Cyanocobalamin (VITAMIN B-12 CR) 1000 MCG TBCR Take 1,000 mcg by mouth daily.        . cyclobenzaprine (FLEXERIL) 10 MG tablet Take 10 mg by mouth 3 (three) times daily as needed for muscle spasms.      Marland Kitchen FLUoxetine (PROZAC) 40 MG capsule Take 40 mg by mouth every morning.      . gabapentin (NEURONTIN) 300 MG capsule Take 300 mg by mouth 3 (three) times daily.      Marland Kitchen morphine (MS CONTIN) 30 MG 12 hr tablet Take 30 mg by mouth every 12 (twelve) hours.      Marland Kitchen oxyCODONE (ROXICODONE) 15 MG immediate release tablet Take 15 mg by mouth 4  (four) times daily.      Marland Kitchen testosterone cypionate (DEPOTESTOTERONE CYPIONATE) 100 MG/ML injection Inject 600 mg into the muscle every 14 (fourteen) days. For IM use only       No current facility-administered medications on file prior to visit.   Past Medical History  Diagnosis Date  . Diabetes mellitus     type II  . Hypertension   . LBP (low back pain)   . Palpitations   . ED (erectile dysfunction)   . OSA on CPAP   . Hypogonadism male   . Vitamin D deficiency   . Osteoarthritis, hand   . Hyperlipidemia   . Atrial fibrillation     1992  . Depression   . PTSD (post-traumatic stress disorder)   . Osteoarthritis   . DDD (degenerative disc disease)   . DDD (degenerative disc disease)   . MVP (mitral valve prolapse)    Past Surgical History  Procedure Laterality Date  . Back surgery  1987  . Cervical laminectomy  2004    [name], rod was placed  .  Appendectomy    . Cholecystectomy    . Colonoscopy    . Knee surgery      left knee/arthroscopic    reports that he quit smoking about 41 years ago. His smoking use included Cigarettes. He has a 12 pack-year smoking history. He does not have any smokeless tobacco history on file. He reports that he drinks about 0.5 ounces of alcohol per week. He reports that he does not use illicit drugs. family history includes Allergies in his mother; Coronary artery disease in his other; Diabetes in his other; Esophageal cancer in his maternal aunt; Heart attack (age of onset: 70) in his father; Heart disease in his brother, father, and mother; Hypertension in his other; Other in his paternal grandmother; Rheum arthritis in his paternal grandmother; Stroke in his mother. There is no history of Colon cancer, Rectal cancer, or Stomach cancer. Allergies  Allergen Reactions  . Amlodipine Besylate     REACTION: swelling  . Cymbalta [Duloxetine Hcl]     constipation  . Digoxin And Related     HR 41  . Duloxetine     REACTION: constipation  .  Prednisolone     Edema, wt gain  . Temazepam      Review of Systems  Constitutional: Positive for fatigue and unexpected weight change. Negative for chills and appetite change.  HENT: Negative for congestion, nosebleeds, sneezing, sore throat and trouble swallowing.   Eyes: Negative for itching and visual disturbance.  Respiratory: Negative for cough and shortness of breath.   Cardiovascular: Negative for chest pain, palpitations and leg swelling.  Gastrointestinal: Negative for nausea, diarrhea, blood in stool and abdominal distention.  Genitourinary: Negative for frequency and hematuria.  Musculoskeletal: Positive for arthralgias (better) and back pain. Negative for gait problem, joint swelling and neck pain.  Skin: Negative for rash.  Neurological: Negative for dizziness, tremors, speech difficulty, weakness, light-headedness and headaches.  Psychiatric/Behavioral: Positive for sleep disturbance and dysphoric mood. Negative for suicidal ideas and agitation. The patient is not nervous/anxious.        Objective:   Physical Exam  Constitutional: He is oriented to person, place, and time. He appears well-developed.  obese  HENT:  Mouth/Throat: Oropharynx is clear and moist.  Eyes: Conjunctivae are normal. Pupils are equal, round, and reactive to light.  Neck: Normal range of motion. No JVD present. No thyromegaly present.  Cardiovascular: Regular rhythm, normal heart sounds and intact distal pulses.  Exam reveals no gallop and no friction rub.   No murmur heard. brady  Pulmonary/Chest: Effort normal and breath sounds normal. No respiratory distress. He has no wheezes. He has no rales. He exhibits no tenderness.  Abdominal: Soft. Bowel sounds are normal. He exhibits no distension and no mass. There is no tenderness. There is no rebound and no guarding.  Musculoskeletal: Normal range of motion. He exhibits tenderness (all spine). He exhibits no edema.  L anterior prox arm sq lump   Lymphadenopathy:    He has no cervical adenopathy.  Neurological: He is alert and oriented to person, place, and time. He has normal reflexes. No cranial nerve deficit. He exhibits normal muscle tone. Coordination normal.  Skin: Skin is warm and dry. No rash noted.  Psychiatric: His behavior is normal. Judgment and thought content normal.  Not depressed, not suicidal or homicidal  LS is tender and stiff  Lab Results  Component Value Date   WBC 8.9 11/01/2012   HGB 16.1 11/01/2012   HCT 48.0 11/01/2012   PLT  237.0 11/01/2012   GLUCOSE 93 11/01/2012   CHOL 128 12/09/2009   TRIG 134.0 12/09/2009   HDL 20.40* 12/09/2009   LDLCALC 81 12/09/2009   ALT 19 11/01/2012   AST 21 11/01/2012   NA 137 11/01/2012   K 4.4 11/01/2012   CL 100 11/01/2012   CREATININE 0.9 11/01/2012   BUN 11 11/01/2012   CO2 29 11/01/2012   TSH 0.70 11/01/2012   PSA 0.57 12/09/2009   HGBA1C 5.3 10/16/2011          Assessment & Plan:

## 2013-03-06 NOTE — Assessment & Plan Note (Signed)
Wt Readings from Last 3 Encounters:  03/06/13 226 lb (102.513 kg)  01/03/13 231 lb (104.781 kg)  11/25/12 234 lb (106.142 kg)

## 2013-03-07 ENCOUNTER — Encounter (HOSPITAL_COMMUNITY)
Admission: RE | Admit: 2013-03-07 | Discharge: 2013-03-07 | Disposition: A | Discharge status: Home / Self Care | Payer: Medicare Other | Source: Ambulatory Visit | Attending: Orthopedic Surgery | Admitting: Orthopedic Surgery

## 2013-03-07 ENCOUNTER — Encounter (HOSPITAL_COMMUNITY): Payer: Self-pay

## 2013-03-07 ENCOUNTER — Ambulatory Visit (HOSPITAL_COMMUNITY)
Admission: RE | Admit: 2013-03-07 | Discharge: 2013-03-07 | Disposition: A | Discharge status: Home / Self Care | Payer: Medicare Other | Source: Ambulatory Visit | Attending: Orthopedic Surgery | Admitting: Orthopedic Surgery

## 2013-03-07 DIAGNOSIS — M47814 Spondylosis without myelopathy or radiculopathy, thoracic region: Secondary | ICD-10-CM | POA: Insufficient documentation

## 2013-03-07 DIAGNOSIS — Z981 Arthrodesis status: Secondary | ICD-10-CM | POA: Insufficient documentation

## 2013-03-07 DIAGNOSIS — Z01812 Encounter for preprocedural laboratory examination: Secondary | ICD-10-CM | POA: Insufficient documentation

## 2013-03-07 DIAGNOSIS — Z01818 Encounter for other preprocedural examination: Secondary | ICD-10-CM | POA: Insufficient documentation

## 2013-03-07 HISTORY — DX: Dysphagia, unspecified: R13.10

## 2013-03-07 LAB — CBC
Hemoglobin: 15.7 g/dL (ref 13.0–17.0)
MCH: 30.2 pg (ref 26.0–34.0)
MCHC: 34.4 g/dL (ref 30.0–36.0)
Platelets: 189 10*3/uL (ref 150–400)
RDW: 13.1 % (ref 11.5–15.5)
WBC: 5.6 10*3/uL (ref 4.0–10.5)

## 2013-03-07 LAB — BASIC METABOLIC PANEL
BUN: 12 mg/dL (ref 6–23)
Creatinine, Ser: 0.8 mg/dL (ref 0.50–1.35)
GFR calc Af Amer: >90 mL/min (ref >90)
GFR calc non Af Amer: 87 mL/min — ABNORMAL LOW (ref >90)
Glucose, Bld: 78 mg/dL (ref 70–99)
Potassium: 4.1 mEq/L (ref 3.5–5.1)

## 2013-03-07 LAB — URINALYSIS, ROUTINE W REFLEX MICROSCOPIC
Bilirubin Urine: NEGATIVE
Glucose, UA: NEGATIVE mg/dL
Hgb urine dipstick: NEGATIVE
Ketones, ur: NEGATIVE mg/dL
pH: 7.5 (ref 5.0–8.0)

## 2013-03-07 LAB — SURGICAL PCR SCREEN
MRSA, PCR: NEGATIVE
Staphylococcus aureus: NEGATIVE

## 2013-03-07 LAB — PROTIME-INR
INR: 1 (ref 0.00–1.49)
Prothrombin Time: 13 seconds (ref 11.6–15.2)

## 2013-03-07 NOTE — Pre-Procedure Instructions (Signed)
EKG REPORT IN EPIC FROM 10/04/12. 2 D ECHO REPORT 05/09/12 IN EPIC. CARDIOLOGY OFFICE NOTE IN EPIC FROM 05/05/12 DR. MCALHANY.

## 2013-03-07 NOTE — Patient Instructions (Addendum)
YOUR SURGERY IS SCHEDULED AT [facility]  ON:  Tuesday  12/16  REPORT TO  SHORT STAY CENTER AT:  12:10 PM      PHONE # FOR SHORT STAY IS [phone]  DO NOT EAT  ANYTHING AFTER MIDNIGHT THE NIGHT BEFORE YOUR SURGERY.   NO FOOD, NO CHEWING GUM, NO MINTS, NO CANDIES, NO CHEWING TOBACCO. YOU MAY HAVE CLEAR LIQUIDS TO DRINK FROM MIDNIGHT UNTIL 9:00 AM THE DAY OF SURGERY - LIKE WATER, COFFEE ( NO MILK OR MILK PRODUCTS ), APPLE JUICE.                   NOTHING TO DRINK AFTER 9:00 AM THE DAY OF SURGERY.  PLEASE TAKE THE FOLLOWING MEDICATIONS THE AM OF YOUR SURGERY WITH A FEW SIPS OF WATER:  AMLODIPINE, BUPROPION, CLONAZEPAM, FLUOXETINE, GABAPENTIN, MORPHINE, OXYCODONE IMMEDIATE RELEASE TABLET.   IF YOU HAVE SLEEP APNEA AND USE CPAP OR BIPAP--PLEASE BRING THE MASK AND THE TUBING.  DO NOT BRING YOUR MACHINE.  DO NOT BRING VALUABLES, MONEY, CREDIT CARDS.  DO NOT WEAR JEWELRY, MAKE-UP, NAIL POLISH AND NO METAL PINS OR CLIPS IN YOUR HAIR. CONTACT LENS, DENTURES / PARTIALS, GLASSES SHOULD NOT BE WORN TO SURGERY AND IN MOST CASES-HEARING AIDS WILL NEED TO BE REMOVED.  BRING YOUR GLASSES CASE, ANY EQUIPMENT NEEDED FOR YOUR CONTACT LENS. FOR PATIENTS ADMITTED TO THE HOSPITAL--CHECK OUT TIME THE DAY OF DISCHARGE IS 11:00 AM.  ALL INPATIENT ROOMS ARE PRIVATE - WITH BATHROOM, TELEPHONE, TELEVISION AND WIFI INTERNET.                                                    PLEASE READ OVER ANY  FACT SHEETS THAT YOU WERE GIVEN: MRSA INFORMATION, BLOOD TRANSFUSION INFORMATION, INCENTIVE SPIROMETER INFORMATION.  FAILURE TO FOLLOW THESE INSTRUCTIONS MAY RESULT IN THE CANCELLATION OF YOUR SURGERY. PLEASE BE AWARE THAT YOU MAY NEED ADDITIONAL BLOOD DRAWN DAY OF YOUR SURGERY  PATIENT SIGNATURE_________________________________  PT NOTIFIED HIS SURGERY TIME WAS CHANGED TO 7:15 AM - AND HE SHOULD ARRIVE TO SHORT STAY BY 5:00 AM - NOTHING TO EAT OR DRINK AFTER MIDNIGHT NIGHT BEFORE SURGERY - MAY NOT HAVE CLEAR  LIQUIDS TO DRINK SINCE SURGERY TIME MOVED UP.  HE MAY HAVE A FEW SIPS OF WATER TO TAKE HIS MEDICATIONS.

## 2013-03-09 NOTE — H&P (Signed)
TOTAL HIP ADMISSION H&P  Patient is admitted for right total hip arthroplasty, anterior approach.  Subjective:  Chief Complaint: Right hip OA / pain  HPI: Vernon Bishop, 72 y.o. male, has a history of pain and functional disability in the right hip(s) due to arthritis and patient has failed non-surgical conservative treatments for greater than 12 weeks to include NSAID's and/or analgesics, corticosteriod injections, use of assistive devices and activity modification.  Onset of symptoms was gradual starting 1+ years ago with gradually worsening course since that time.The patient noted no past surgery on the right hip(s).  Patient currently rates pain in the right hip at 10 out of 10 with activity. Patient has worsening of pain with activity and weight bearing, trendelenberg gait, pain that interfers with activities of daily living and pain with passive range of motion. Patient has evidence of periarticular osteophytes and joint space narrowing by imaging studies. This condition presents safety issues increasing the risk of falls.  There is no current active infection.  Risks, benefits and expectations were discussed with the patient.  Risks including but not limited to the risk of anesthesia, blood clots, nerve damage, blood vessel damage, failure of the prosthesis, infection and up to and including death.  Patient understand the risks, benefits and expectations and wishes to proceed with surgery.   D/C Plans:   Home with HHPT  Post-op Meds:    No Rx given  Tranexamic Acid:   To be given  Decadron:    Not to be given  FYI:    ASA post-op  Adjust current pain management medications  Known bradycardia   Patient Active Problem List   Diagnosis Date Noted  . Osteoarthritis of right hip 03/06/2013  . Bradycardia 10/09/2012  . Sinusitis, acute 06/29/2012  . Actinic keratoses 05/24/2012  . Hyponatremia 10/23/2011  . Edema 10/23/2011  . Shoulder pain, acute 07/13/2011  . Arthralgia 06/08/2011   . Cervical pain 01/28/2011  . Neoplasm of uncertain behavior of skin 06/16/2010  . BRADYCARDIA 03/17/2010  . SINUSITIS, ACUTE 12/11/2009  . PAIN IN THORACIC SPINE 04/26/2009  . TOBACCO USE, QUIT 02/19/2009  . OBSTRUCTIVE SLEEP APNEA 11/15/2008  . HYPOGONADISM, MALE 05/02/2008  . B12 DEFICIENCY 08/26/2007  . VISUAL CHANGES 08/25/2007  . OTHER CONVULSIONS 08/25/2007  . SLEEP DEPRIVATION 08/25/2007  . PALPITATIONS 08/25/2007  . HIP PAIN 04/29/2007  . OSTEOARTHRITIS 02/15/2007  . HYPERLIPIDEMIA 02/14/2007  . DEPRESSION 02/14/2007  . Atrial fibrillation 02/14/2007  . SEBORRHEIC DERMATITIS 02/14/2007  . DIABETES MELLITUS, TYPE II 01/11/2007  . HYPERTENSION 01/11/2007  . LOW BACK PAIN 01/11/2007   Past Medical History  Diagnosis Date  . Hypertension   . LBP (low back pain)   . Palpitations   . ED (erectile dysfunction)   . OSA on CPAP   . Hypogonadism male   . Vitamin D deficiency   . Hyperlipidemia   . Atrial fibrillation     1992  . Depression   . PTSD (post-traumatic stress disorder)   . MVP (mitral valve prolapse)   . Swallowing problem     HX OF PILLS GETTING "STUCK" IN THROAT AT TIMES  . Diabetes mellitus     type II  - PT STATES NOT ON ANY DIABETIC MEDICINES  . Osteoarthritis, hand     SEVERE LOWER BACK PAIN, AND RESTLESS LEG SYNDROME  . Osteoarthritis   . DDD (degenerative disc disease)   . DDD (degenerative disc disease)     Past Surgical History  Procedure Laterality Date  .  Back surgery  1987  . Cervical laminectomy  2004    [name], rod was placed- HAS ROM LIMITATIONS  . Appendectomy    . Cholecystectomy    . Colonoscopy    . Knee surgery      left knee/arthroscopic    No prescriptions prior to admission   Allergies  Allergen Reactions  . Cymbalta [Duloxetine Hcl]     constipation  . Digoxin And Related     HR 41  . Duloxetine     REACTION: constipation  . Prednisolone     Edema, wt gain  . Temazepam     History  Substance Use Topics  .  Smoking status: Former Smoker -- 1.00 packs/day for 12 years    Types: Cigarettes    Quit date: 03/31/1971  . Smokeless tobacco: Not on file     Comment: Started at age 49  . Alcohol Use: 0.5 oz/week    1 drink(s) per week     Comment: occasionally    Family History  Problem Relation Age of Onset  . Allergies Mother   . Stroke Mother     Clotting disorders  . Heart disease Mother   . Heart disease Father   . Heart attack Father 21  . Rheum arthritis Paternal Grandmother   . Other Paternal Grandmother     Rheumatism  . Coronary artery disease Other   . Diabetes Other   . Hypertension Other   . Heart disease Brother   . Esophageal cancer Maternal Aunt   . Colon cancer Neg Hx   . Rectal cancer Neg Hx   . Stomach cancer Neg Hx      Review of Systems  Constitutional: Positive for malaise/fatigue.  HENT: Negative.   Eyes: Negative.   Respiratory: Negative.   Cardiovascular: Negative.   Gastrointestinal: Negative.   Genitourinary: Negative.   Musculoskeletal: Positive for joint pain.  Skin: Negative.   Neurological: Negative.   Endo/Heme/Allergies: Negative.   Psychiatric/Behavioral: Positive for depression.    Objective:  Physical Exam  Constitutional: He is oriented to person, place, and time. He appears well-developed and well-nourished.  HENT:  Head: Normocephalic and atraumatic.  Mouth/Throat: Oropharynx is clear and moist.  Eyes: Pupils are equal, round, and reactive to light.  Neck: Neck supple. No JVD present. No tracheal deviation present. No thyromegaly present.  Cardiovascular: Regular rhythm, normal heart sounds and intact distal pulses.  Bradycardia present.   Respiratory: Effort normal and breath sounds normal. No stridor. No respiratory distress. He has no wheezes.  GI: Soft. There is no tenderness. There is no guarding.  Musculoskeletal:       Right hip: He exhibits decreased range of motion, decreased strength, tenderness and bony tenderness. He  exhibits no swelling, no deformity and no laceration.  Lymphadenopathy:    He has no cervical adenopathy.  Neurological: He is alert and oriented to person, place, and time.  Skin: Skin is warm and dry.  Psychiatric: He has a normal mood and affect.    Labs:  Estimated body mass index is 31.32 kg/(m^2) as calculated from the following:   Height as of 01/03/13: 6' (1.829 m).   Weight as of 01/03/13: 104.781 kg (231 lb).   Imaging Review Plain radiographs demonstrate severe degenerative joint disease of the right hip(s). The bone quality appears to be good for age and reported activity level.  Assessment/Plan:  End stage arthritis, right hip(s)  The patient history, physical examination, clinical judgement of the provider and imaging  studies are consistent with end stage degenerative joint disease of the right hip(s) and total hip arthroplasty is deemed medically necessary. The treatment options including medical management, injection therapy, arthroscopy and arthroplasty were discussed at length. The risks and benefits of total hip arthroplasty were presented and reviewed. The risks due to aseptic loosening, infection, stiffness, dislocation/subluxation,  thromboembolic complications and other imponderables were discussed.  The patient acknowledged the explanation, agreed to proceed with the plan and consent was signed. Patient is being admitted for inpatient treatment for surgery, pain control, PT, OT, prophylactic antibiotics, VTE prophylaxis, progressive ambulation and ADL's and discharge planning.The patient is planning to be discharged home with home health services.    Anastasio Auerbach Elanora Quin   PAC  03/09/2013, 3:12 PM

## 2013-03-13 NOTE — Anesthesia Preprocedure Evaluation (Addendum)
Anesthesia Evaluation  Patient identified by MRN, date of birth, ID band Patient awake    Reviewed: Allergy & Precautions, H&P , NPO status , Patient's Chart, lab work & pertinent test results  Airway Mallampati: II TM Distance: >3 FB Neck ROM: Full    Dental no notable dental hx.    Pulmonary sleep apnea and Continuous Positive Airway Pressure Ventilation , former smoker,  breath sounds clear to auscultation  Pulmonary exam normal       Cardiovascular hypertension, Pt. on medications + dysrhythmias (one episode of afib 1992. none since) Atrial Fibrillation + Valvular Problems/Murmurs MVP Rhythm:Regular Rate:Normal     Neuro/Psych negative neurological ROS  negative psych ROS   GI/Hepatic negative GI ROS, Neg liver ROS,   Endo/Other  diabetes  Renal/GU negative Renal ROS  negative genitourinary   Musculoskeletal negative musculoskeletal ROS (+)   Abdominal   Peds negative pediatric ROS (+)  Hematology negative hematology ROS (+)   Anesthesia Other Findings   Reproductive/Obstetrics negative OB ROS                          Anesthesia Physical Anesthesia Plan  ASA: II  Anesthesia Plan: Spinal   Post-op Pain Management:    Induction:   Airway Management Planned: Simple Face Mask  Additional Equipment:   Intra-op Plan:   Post-operative Plan:   Informed Consent: I have reviewed the patients History and Physical, chart, labs and discussed the procedure including the risks, benefits and alternatives for the proposed anesthesia with the patient or authorized representative who has indicated his/her understanding and acceptance.   Dental advisory given  Plan Discussed with: CRNA  Anesthesia Plan Comments:         Anesthesia Quick Evaluation

## 2013-03-14 ENCOUNTER — Encounter (HOSPITAL_COMMUNITY): Payer: Self-pay | Admitting: *Deleted

## 2013-03-14 ENCOUNTER — Inpatient Hospital Stay (HOSPITAL_COMMUNITY): Payer: Medicare Other | Admitting: Anesthesiology

## 2013-03-14 ENCOUNTER — Encounter (HOSPITAL_COMMUNITY): Payer: Medicare Other | Admitting: Anesthesiology

## 2013-03-14 ENCOUNTER — Inpatient Hospital Stay (HOSPITAL_COMMUNITY): Payer: Medicare Other

## 2013-03-14 ENCOUNTER — Inpatient Hospital Stay (HOSPITAL_COMMUNITY)
Admission: RE | Admit: 2013-03-14 | Discharge: 2013-03-15 | DRG: 470 | Disposition: A | Discharge status: Home Health | Payer: Medicare Other | Source: Ambulatory Visit | Attending: Orthopedic Surgery | Admitting: Orthopedic Surgery

## 2013-03-14 ENCOUNTER — Encounter (HOSPITAL_COMMUNITY)
Admission: RE | Disposition: A | Discharge status: Home Health | Payer: Self-pay | Source: Ambulatory Visit | Attending: Orthopedic Surgery

## 2013-03-14 DIAGNOSIS — E663 Overweight: Secondary | ICD-10-CM

## 2013-03-14 DIAGNOSIS — E538 Deficiency of other specified B group vitamins: Secondary | ICD-10-CM | POA: Diagnosis present

## 2013-03-14 DIAGNOSIS — Z96649 Presence of unspecified artificial hip joint: Secondary | ICD-10-CM

## 2013-03-14 DIAGNOSIS — F4310 Post-traumatic stress disorder, unspecified: Secondary | ICD-10-CM | POA: Diagnosis present

## 2013-03-14 DIAGNOSIS — F329 Major depressive disorder, single episode, unspecified: Secondary | ICD-10-CM | POA: Diagnosis present

## 2013-03-14 DIAGNOSIS — Z87891 Personal history of nicotine dependence: Secondary | ICD-10-CM

## 2013-03-14 DIAGNOSIS — M1610 Unilateral primary osteoarthritis, unspecified hip: Principal | ICD-10-CM | POA: Diagnosis present

## 2013-03-14 DIAGNOSIS — E119 Type 2 diabetes mellitus without complications: Secondary | ICD-10-CM | POA: Diagnosis present

## 2013-03-14 DIAGNOSIS — I4891 Unspecified atrial fibrillation: Secondary | ICD-10-CM | POA: Diagnosis present

## 2013-03-14 DIAGNOSIS — E785 Hyperlipidemia, unspecified: Secondary | ICD-10-CM | POA: Diagnosis present

## 2013-03-14 DIAGNOSIS — Z888 Allergy status to other drugs, medicaments and biological substances status: Secondary | ICD-10-CM

## 2013-03-14 DIAGNOSIS — M169 Osteoarthritis of hip, unspecified: Principal | ICD-10-CM | POA: Diagnosis present

## 2013-03-14 DIAGNOSIS — G4733 Obstructive sleep apnea (adult) (pediatric): Secondary | ICD-10-CM | POA: Diagnosis present

## 2013-03-14 DIAGNOSIS — F3289 Other specified depressive episodes: Secondary | ICD-10-CM | POA: Diagnosis present

## 2013-03-14 DIAGNOSIS — E291 Testicular hypofunction: Secondary | ICD-10-CM | POA: Diagnosis present

## 2013-03-14 DIAGNOSIS — I10 Essential (primary) hypertension: Secondary | ICD-10-CM | POA: Diagnosis present

## 2013-03-14 DIAGNOSIS — Z8249 Family history of ischemic heart disease and other diseases of the circulatory system: Secondary | ICD-10-CM

## 2013-03-14 DIAGNOSIS — Z6829 Body mass index (BMI) 29.0-29.9, adult: Secondary | ICD-10-CM

## 2013-03-14 DIAGNOSIS — Z833 Family history of diabetes mellitus: Secondary | ICD-10-CM

## 2013-03-14 HISTORY — PX: TOTAL HIP ARTHROPLASTY: SHX124

## 2013-03-14 LAB — GLUCOSE, CAPILLARY: Glucose-Capillary: 103 mg/dL — ABNORMAL HIGH (ref 70–99)

## 2013-03-14 LAB — TYPE AND SCREEN

## 2013-03-14 SURGERY — ARTHROPLASTY, HIP, TOTAL, ANTERIOR APPROACH
Anesthesia: Spinal | Site: Hip | Laterality: Right

## 2013-03-14 MED ORDER — CHLORHEXIDINE GLUCONATE 4 % EX LIQD: 60.0000 mL | Freq: Once | CUTANEOUS | Status: DC

## 2013-03-14 MED ORDER — SODIUM CHLORIDE 0.9 % IV SOLN
INTRAVENOUS | Status: DC
  Administered 2013-03-14: 15:00:00 via INTRAVENOUS
  Filled 2013-03-14 (×8): qty 1000

## 2013-03-14 MED ORDER — SENNA 8.6 MG PO TABS
1.0000 | ORAL_TABLET | Freq: Two times a day (BID) | ORAL | Status: DC
  Administered 2013-03-14 (×2): 8.6 mg via ORAL
  Filled 2013-03-14 (×6): qty 1

## 2013-03-14 MED ORDER — GABAPENTIN 300 MG PO CAPS
300.0000 mg | ORAL_CAPSULE | Freq: Three times a day (TID) | ORAL | Status: DC
  Administered 2013-03-14 – 2013-03-15 (×3): 300 mg via ORAL
  Filled 2013-03-14 (×6): qty 1

## 2013-03-14 MED ORDER — CEFAZOLIN SODIUM-DEXTROSE 2-3 GM-% IV SOLR
2.0000 g | Freq: Four times a day (QID) | INTRAVENOUS | Status: AC
  Administered 2013-03-14 (×2): 2 g via INTRAVENOUS
  Filled 2013-03-14 (×3): qty 50

## 2013-03-14 MED ORDER — PROPOFOL INFUSION 10 MG/ML OPTIME
INTRAVENOUS | Status: DC | PRN
  Administered 2013-03-14: 50 ug/kg/min via INTRAVENOUS

## 2013-03-14 MED ORDER — HYDROMORPHONE HCL PF 1 MG/ML IJ SOLN
0.5000 mg | INTRAMUSCULAR | Status: DC | PRN
  Administered 2013-03-14 (×3): 1 mg via INTRAVENOUS
  Filled 2013-03-14 (×3): qty 1

## 2013-03-14 MED ORDER — ALUMINUM HYDROXIDE GEL 320 MG/5ML PO SUSP
15.0000 mL | ORAL | Status: DC | PRN
  Filled 2013-03-14: qty 30

## 2013-03-14 MED ORDER — ASPIRIN EC 325 MG PO TBEC: 325.0000 mg | DELAYED_RELEASE_TABLET | Freq: Two times a day (BID) | ORAL | Status: DC

## 2013-03-14 MED ORDER — VITAMIN B-12 ER 1000 MCG PO TBCR
1000.0000 ug | EXTENDED_RELEASE_TABLET | Freq: Every day | ORAL | Status: DC
Start: 2013-03-14 — End: 2013-03-14

## 2013-03-14 MED ORDER — DOCUSATE SODIUM 100 MG PO CAPS
100.0000 mg | ORAL_CAPSULE | Freq: Two times a day (BID) | ORAL | Status: DC
  Administered 2013-03-14 – 2013-03-15 (×3): 100 mg via ORAL
  Filled 2013-03-14 (×4): qty 1

## 2013-03-14 MED ORDER — FERROUS SULFATE 325 (65 FE) MG PO TABS
325.0000 mg | ORAL_TABLET | Freq: Three times a day (TID) | ORAL | Status: DC
  Administered 2013-03-14 – 2013-03-15 (×3): 325 mg via ORAL
  Filled 2013-03-14 (×6): qty 1

## 2013-03-14 MED ORDER — STERILE WATER FOR IRRIGATION IR SOLN
Status: DC | PRN
  Administered 2013-03-14: 1500 mL

## 2013-03-14 MED ORDER — MENTHOL 3 MG MT LOZG: 1.0000 | LOZENGE | OROMUCOSAL | Status: DC | PRN

## 2013-03-14 MED ORDER — GLYCOPYRROLATE 0.2 MG/ML IJ SOLN
0.2000 mg | Freq: Once | INTRAMUSCULAR | Status: AC
  Administered 2013-03-14: 0.2 mg via INTRAVENOUS

## 2013-03-14 MED ORDER — CLONAZEPAM 1 MG PO TABS
1.0000 mg | ORAL_TABLET | Freq: Two times a day (BID) | ORAL | Status: DC
  Administered 2013-03-14 – 2013-03-15 (×2): 1 mg via ORAL
  Filled 2013-03-14 (×2): qty 1

## 2013-03-14 MED ORDER — PHENOL 1.4 % MT LIQD: 1.0000 | OROMUCOSAL | Status: DC | PRN

## 2013-03-14 MED ORDER — PROMETHAZINE HCL 25 MG/ML IJ SOLN: 6.2500 mg | INTRAMUSCULAR | Status: DC | PRN

## 2013-03-14 MED ORDER — PROPOFOL 10 MG/ML IV BOLUS
INTRAVENOUS | Status: AC
  Filled 2013-03-14: qty 20

## 2013-03-14 MED ORDER — EPHEDRINE SULFATE 50 MG/ML IJ SOLN
INTRAMUSCULAR | Status: AC
  Filled 2013-03-14: qty 1

## 2013-03-14 MED ORDER — LACTATED RINGERS IV SOLN
INTRAVENOUS | Status: DC | PRN
  Administered 2013-03-14 (×2): via INTRAVENOUS

## 2013-03-14 MED ORDER — POLYETHYLENE GLYCOL 3350 17 G PO PACK
17.0000 g | PACK | Freq: Every day | ORAL | Status: DC | PRN
  Administered 2013-03-15: 17 g via ORAL
  Filled 2013-03-14: qty 1

## 2013-03-14 MED ORDER — FLUOXETINE HCL 40 MG PO CAPS: 40.0000 mg | ORAL_CAPSULE | Freq: Every morning | ORAL | Status: DC

## 2013-03-14 MED ORDER — ASPIRIN EC 325 MG PO TBEC
325.0000 mg | DELAYED_RELEASE_TABLET | Freq: Two times a day (BID) | ORAL | Status: DC
  Administered 2013-03-15: 325 mg via ORAL
  Filled 2013-03-14 (×3): qty 1

## 2013-03-14 MED ORDER — EPHEDRINE SULFATE 50 MG/ML IJ SOLN
INTRAMUSCULAR | Status: DC | PRN
  Administered 2013-03-14 (×3): 10 mg via INTRAVENOUS

## 2013-03-14 MED ORDER — FENTANYL CITRATE 0.05 MG/ML IJ SOLN
INTRAMUSCULAR | Status: AC
  Filled 2013-03-14: qty 2

## 2013-03-14 MED ORDER — SODIUM CHLORIDE 0.9 % IJ SOLN
INTRAMUSCULAR | Status: AC
  Filled 2013-03-14: qty 10

## 2013-03-14 MED ORDER — OXYCODONE HCL 5 MG PO TABS
15.0000 mg | ORAL_TABLET | ORAL | Status: DC
  Administered 2013-03-14 – 2013-03-15 (×7): 15 mg via ORAL
  Filled 2013-03-14 (×7): qty 3

## 2013-03-14 MED ORDER — ASPIRIN EC 325 MG PO TBEC
325.0000 mg | DELAYED_RELEASE_TABLET | Freq: Every day | ORAL | Status: DC
  Filled 2013-03-14 (×2): qty 1

## 2013-03-14 MED ORDER — CEFAZOLIN SODIUM-DEXTROSE 2-3 GM-% IV SOLR
2.0000 g | INTRAVENOUS | Status: AC
  Administered 2013-03-14: 2 g via INTRAVENOUS

## 2013-03-14 MED ORDER — 0.9 % SODIUM CHLORIDE (POUR BTL) OPTIME
TOPICAL | Status: DC | PRN
  Administered 2013-03-14: 1000 mL

## 2013-03-14 MED ORDER — BUPROPION HCL ER (SR) 150 MG PO TB12
150.0000 mg | ORAL_TABLET | Freq: Two times a day (BID) | ORAL | Status: DC
  Administered 2013-03-14 – 2013-03-15 (×2): 150 mg via ORAL
  Filled 2013-03-14 (×4): qty 1

## 2013-03-14 MED ORDER — FENTANYL CITRATE 0.05 MG/ML IJ SOLN
INTRAMUSCULAR | Status: DC | PRN
  Administered 2013-03-14: 100 ug via INTRAVENOUS

## 2013-03-14 MED ORDER — MORPHINE SULFATE ER 30 MG PO TBCR
30.0000 mg | EXTENDED_RELEASE_TABLET | Freq: Two times a day (BID) | ORAL | Status: DC
  Administered 2013-03-14: 30 mg via ORAL
  Filled 2013-03-14: qty 1

## 2013-03-14 MED ORDER — AMLODIPINE BESYLATE 10 MG PO TABS
10.0000 mg | ORAL_TABLET | Freq: Every morning | ORAL | Status: DC
  Administered 2013-03-15: 10 mg via ORAL
  Filled 2013-03-14 (×2): qty 1

## 2013-03-14 MED ORDER — FENTANYL CITRATE 0.05 MG/ML IJ SOLN
25.0000 ug | INTRAMUSCULAR | Status: DC | PRN
  Administered 2013-03-14 (×2): 50 ug via INTRAVENOUS

## 2013-03-14 MED ORDER — GLYCOPYRROLATE 0.2 MG/ML IJ SOLN
INTRAMUSCULAR | Status: AC
  Filled 2013-03-14: qty 1

## 2013-03-14 MED ORDER — BUPIVACAINE HCL (PF) 0.5 % IJ SOLN
INTRAMUSCULAR | Status: AC
  Filled 2013-03-14: qty 30

## 2013-03-14 MED ORDER — ONDANSETRON HCL 4 MG PO TABS
4.0000 mg | ORAL_TABLET | Freq: Four times a day (QID) | ORAL | Status: DC | PRN
  Filled 2013-03-14: qty 1

## 2013-03-14 MED ORDER — FLUOXETINE HCL 20 MG PO CAPS
40.0000 mg | ORAL_CAPSULE | Freq: Every day | ORAL | Status: DC
  Administered 2013-03-15: 14:00:00 40 mg via ORAL
  Filled 2013-03-14 (×2): qty 2

## 2013-03-14 MED ORDER — DIPHENHYDRAMINE HCL 12.5 MG/5ML PO ELIX
25.0000 mg | ORAL_SOLUTION | Freq: Four times a day (QID) | ORAL | Status: DC | PRN
  Filled 2013-03-14: qty 10

## 2013-03-14 MED ORDER — CEFAZOLIN SODIUM-DEXTROSE 2-3 GM-% IV SOLR
INTRAVENOUS | Status: AC
  Filled 2013-03-14: qty 50

## 2013-03-14 MED ORDER — MIDAZOLAM HCL 2 MG/2ML IJ SOLN
INTRAMUSCULAR | Status: AC
  Filled 2013-03-14: qty 2

## 2013-03-14 MED ORDER — LACTATED RINGERS IV SOLN: INTRAVENOUS | Status: DC

## 2013-03-14 MED ORDER — METHOCARBAMOL 500 MG PO TABS
500.0000 mg | ORAL_TABLET | Freq: Four times a day (QID) | ORAL | Status: DC | PRN
  Administered 2013-03-15: 06:00:00 500 mg via ORAL
  Filled 2013-03-14: qty 1

## 2013-03-14 MED ORDER — BUPIVACAINE HCL (PF) 0.5 % IJ SOLN
INTRAMUSCULAR | Status: DC | PRN
  Administered 2013-03-14: 3 mL

## 2013-03-14 MED ORDER — MIDAZOLAM HCL 5 MG/5ML IJ SOLN
INTRAMUSCULAR | Status: DC | PRN
  Administered 2013-03-14: 2 mg via INTRAVENOUS

## 2013-03-14 MED ORDER — METHOCARBAMOL 100 MG/ML IJ SOLN
500.0000 mg | Freq: Four times a day (QID) | INTRAVENOUS | Status: DC | PRN
  Administered 2013-03-14: 500 mg via INTRAVENOUS
  Filled 2013-03-14: qty 5

## 2013-03-14 MED ORDER — VITAMIN B-12 1000 MCG PO TABS
1000.0000 ug | ORAL_TABLET | Freq: Every day | ORAL | Status: DC
  Administered 2013-03-14 – 2013-03-15 (×2): 1000 ug via ORAL
  Filled 2013-03-14 (×3): qty 1

## 2013-03-14 MED ORDER — TRANEXAMIC ACID 100 MG/ML IV SOLN
1000.0000 mg | Freq: Once | INTRAVENOUS | Status: AC
  Administered 2013-03-14: 1000 mg via INTRAVENOUS
  Filled 2013-03-14: qty 10

## 2013-03-14 MED ORDER — MEPERIDINE HCL 50 MG/ML IJ SOLN: 6.2500 mg | INTRAMUSCULAR | Status: DC | PRN

## 2013-03-14 MED ORDER — ACETAMINOPHEN 500 MG PO TABS
1000.0000 mg | ORAL_TABLET | Freq: Three times a day (TID) | ORAL | Status: AC | PRN
  Filled 2013-03-14: qty 2

## 2013-03-14 MED ORDER — ONDANSETRON HCL 4 MG/2ML IJ SOLN: 4.0000 mg | Freq: Four times a day (QID) | INTRAMUSCULAR | Status: DC | PRN

## 2013-03-14 SURGICAL SUPPLY — 40 items
ADH SKN CLS APL DERMABOND .7 (GAUZE/BANDAGES/DRESSINGS) ×1
BAG SPEC THK2 15X12 ZIP CLS (MISCELLANEOUS)
BAG ZIPLOCK 12X15 (MISCELLANEOUS) IMPLANT
BLADE SAW SGTL 18X1.27X75 (BLADE) ×2 IMPLANT
CAPT HIP PF MOP ×1 IMPLANT
DERMABOND ADVANCED (GAUZE/BANDAGES/DRESSINGS) ×1
DERMABOND ADVANCED .7 DNX12 (GAUZE/BANDAGES/DRESSINGS) ×1 IMPLANT
DRAPE C-ARM 42X120 X-RAY (DRAPES) ×2 IMPLANT
DRAPE POUCH INSTRU U-SHP 10X18 (DRAPES) IMPLANT
DRAPE STERI IOBAN 125X83 (DRAPES) ×2 IMPLANT
DRAPE U-SHAPE 47X51 STRL (DRAPES) ×6 IMPLANT
DRSG AQUACEL AG ADV 3.5X10 (GAUZE/BANDAGES/DRESSINGS) ×2 IMPLANT
DRSG TEGADERM 4X4.75 (GAUZE/BANDAGES/DRESSINGS) IMPLANT
DURAPREP 26ML APPLICATOR (WOUND CARE) ×2 IMPLANT
ELECT BLADE TIP CTD 4 INCH (ELECTRODE) ×2 IMPLANT
ELECT REM PT RETURN 9FT ADLT (ELECTROSURGICAL) ×2
ELECTRODE REM PT RTRN 9FT ADLT (ELECTROSURGICAL) ×1 IMPLANT
EVACUATOR 1/8 PVC DRAIN (DRAIN) IMPLANT
FACESHIELD LNG OPTICON STERILE (SAFETY) ×8 IMPLANT
GAUZE SPONGE 2X2 8PLY STRL LF (GAUZE/BANDAGES/DRESSINGS) IMPLANT
GLOVE BIOGEL PI IND STRL 7.5 (GLOVE) ×1 IMPLANT
GLOVE BIOGEL PI IND STRL 8 (GLOVE) ×1 IMPLANT
GLOVE BIOGEL PI INDICATOR 7.5 (GLOVE) ×1
GLOVE BIOGEL PI INDICATOR 8 (GLOVE) ×1
GLOVE ECLIPSE 8.0 STRL XLNG CF (GLOVE) ×2 IMPLANT
GLOVE ORTHO TXT STRL SZ7.5 (GLOVE) ×4 IMPLANT
GOWN BRE IMP PREV XXLGXLNG (GOWN DISPOSABLE) ×2 IMPLANT
GOWN PREVENTION PLUS LG XLONG (DISPOSABLE) ×2 IMPLANT
KIT BASIN OR (CUSTOM PROCEDURE TRAY) ×2 IMPLANT
PACK TOTAL JOINT (CUSTOM PROCEDURE TRAY) ×2 IMPLANT
PADDING CAST COTTON 6X4 STRL (CAST SUPPLIES) ×2 IMPLANT
SPONGE GAUZE 2X2 STER 10/PKG (GAUZE/BANDAGES/DRESSINGS)
SUT MNCRL AB 4-0 PS2 18 (SUTURE) ×2 IMPLANT
SUT VIC AB 1 CT1 36 (SUTURE) ×6 IMPLANT
SUT VIC AB 2-0 CT1 27 (SUTURE) ×4
SUT VIC AB 2-0 CT1 TAPERPNT 27 (SUTURE) ×2 IMPLANT
SUT VLOC 180 0 24IN GS25 (SUTURE) ×2 IMPLANT
TOWEL OR 17X26 10 PK STRL BLUE (TOWEL DISPOSABLE) ×2 IMPLANT
TOWEL OR NON WOVEN STRL DISP B (DISPOSABLE) IMPLANT
TRAY FOLEY CATH 14FRSI W/METER (CATHETERS) IMPLANT

## 2013-03-14 NOTE — Anesthesia Procedure Notes (Signed)
Spinal  Patient location during procedure: OR Start time: 03/14/2013 7:20 AM Staffing Anesthesiologist: Phillips Grout CRNA/Resident: [place], Jalise Zawistowski C Performed by: resident/CRNA  Preanesthetic Checklist Completed: patient identified, site marked, surgical consent, pre-op evaluation, timeout performed, IV checked, risks and benefits discussed and monitors and equipment checked Spinal Block Patient position: sitting Prep: Betadine Patient monitoring: heart rate, cardiac monitor, continuous pulse ox and blood pressure Approach: midline Location: L2-3 Injection technique: single-shot Needle Needle gauge: 22 G

## 2013-03-14 NOTE — Progress Notes (Signed)
Portable AP Pelvis and Lateral Right Hip X-rays done. 

## 2013-03-14 NOTE — Progress Notes (Signed)
X-RAY RESULTS NOTED. 

## 2013-03-14 NOTE — Progress Notes (Signed)
Patient drank 200 cc of Orange Juice in PACU.

## 2013-03-14 NOTE — Interval H&P Note (Signed)
History and Physical Interval Note:  03/14/2013 7:00 AM  Vernon Bishop  has presented today for surgery, with the diagnosis of Osteoarthritis of the Right Hip  The various methods of treatment have been discussed with the patient and family. After consideration of risks, benefits and other options for treatment, the patient has consented to  Procedure(s): RIGHT TOTAL HIP ARTHROPLASTY ANTERIOR APPROACH (Right) as a surgical intervention .  The patient's history has been reviewed, patient examined, no change in status, stable for surgery.  I have reviewed the patient's chart and labs.  Questions were answered to the patient's satisfaction.     Shelda Pal

## 2013-03-14 NOTE — Evaluation (Signed)
Physical Therapy Evaluation Patient Details Name: Vernon Bishop MRN: [id number] DOB: 04-22-1940 Today's Date: 03/14/2013 Time: 4098-1191 PT Time Calculation (min): 13 min  PT Assessment / Plan / Recommendation History of Present Illness     Clinical Impression  Pt is s/p R direct anterior THA resulting in the deficits listed below (see PT Problem List).  Pt will benefit from skilled PT to increase their independence and safety with mobility to allow discharge to the venue listed below.  Pt mobilizing well POD #0.     PT Assessment  Patient needs continued PT services    Follow Up Recommendations  Home health PT    Does the patient have the potential to tolerate intense rehabilitation      Barriers to Discharge        Equipment Recommendations  Rolling walker with 5" wheels    Recommendations for Other Services     Frequency 7X/week    Precautions / Restrictions Precautions Precautions: None Restrictions Other Position/Activity Restrictions: WBAT   Pertinent Vitals/Pain  Pt reports moderate pain however premedicated.       Mobility  Bed Mobility Bed Mobility: Supine to Sit Supine to Sit: HOB elevated;5: Supervision Details for Bed Mobility Assistance: verbal cues for self assist for R LE Transfers Transfers: Sit to Stand;Stand to Sit Sit to Stand: 4: Min guard;With upper extremity assist;From bed Stand to Sit: 4: Min guard;With upper extremity assist;To chair/3-in-1 Details for Transfer Assistance: verbal cues for hand placement Ambulation/Gait Ambulation/Gait Assistance: 4: Min guard Ambulation Distance (Feet): 120 Feet Assistive device: Rolling walker Ambulation/Gait Assistance Details: verbal cues for sequence, posture, RW distance Gait Pattern: Step-to pattern;Step-through pattern;Antalgic Gait velocity: decr    Exercises     PT Diagnosis: Difficulty walking;Acute pain  PT Problem List: Decreased strength;Decreased range of motion;Decreased  mobility;Pain;Decreased knowledge of use of DME PT Treatment Interventions: DME instruction;Gait training;Stair training;Functional mobility training;Patient/family education;Therapeutic activities;Therapeutic exercise     PT Goals(Current goals can be found in the care plan section) Acute Rehab PT Goals PT Goal Formulation: With patient Time For Goal Achievement: 03/21/13 Potential to Achieve Goals: Good  Visit Information  Last PT Received On: 03/14/13 Assistance Needed: +1       Prior Functioning  Home Living Family/patient expects to be discharged to:: Private residence Living Arrangements: Spouse/significant other Available Help at Discharge: Family Type of Home: House Home Access: Stairs to enter Secretary/administrator of Steps: 5-6 Entrance Stairs-Rails: Left;Right Home Layout: One level Home Equipment: Cane - single point;Crutches Prior Function Level of Independence: Independent Communication Communication: No difficulties    Cognition  Cognition Arousal/Alertness: Awake/alert Behavior During Therapy: WFL for tasks assessed/performed Overall Cognitive Status: Within Functional Limits for tasks assessed    Extremity/Trunk Assessment Lower Extremity Assessment Lower Extremity Assessment: RLE deficits/detail RLE Deficits / Details: 2+/5 hip flexion, assist required for bed mobility   Balance    End of Session PT - End of Session Activity Tolerance: Patient tolerated treatment well Patient left: with call bell/phone within reach;in chair;with family/visitor present  GP     [name],[name] 03/14/2013, 4:50 PM Zenovia Jarred, PT, DPT 03/14/2013 Pager: [phone]

## 2013-03-14 NOTE — Transfer of Care (Signed)
Immediate Anesthesia Transfer of Care Note  Patient: Vernon Bishop  Procedure(s) Performed: Procedure(s): RIGHT TOTAL HIP ARTHROPLASTY ANTERIOR APPROACH (Right)  Patient Location: PACU  Anesthesia Type:MAC and Spinal  Level of Consciousness: awake and alert   Airway & Oxygen Therapy: Patient Spontanous Breathing and Patient connected to face mask oxygen  Post-op Assessment: Report given to PACU RN and Post -op Vital signs reviewed and stable  Post vital signs: Reviewed and stable  Complications: No apparent anesthesia complications

## 2013-03-14 NOTE — Progress Notes (Signed)
Dr. Acey Lav made aware of patient's heart rates being in the 30s-40s-Robinul 0.2 mg IVP given as ordered; also made aware of patient's CBG RESULS IN pacu being 70- to recheck in 30 minutes.

## 2013-03-14 NOTE — Op Note (Signed)
NAME:  Vernon Bishop NO.: [id number]      MEDICAL RECORD NO.: [id number]      FACILITY:  [facility]      PHYSICIAN:  Durene Romans D  DATE OF BIRTH:  19-Nov-1940     DATE OF PROCEDURE:  03/14/2013                                 OPERATIVE REPORT         PREOPERATIVE DIAGNOSIS: Right  hip osteoarthritis.      POSTOPERATIVE DIAGNOSIS:  Right hip osteoarthritis.      PROCEDURE:  Right total hip replacement through an anterior approach   utilizing DePuy THR system, component size 56mm pinnacle cup, a size 36+4 neutral   Altrex liner, a size 7Hi Tri Lock stem with a 36+1.5 Articuleze metal ball.      SURGEON:  Madlyn Frankel. Charlann Boxer, M.D.      ASSISTANT:  Leilani Able, PA-C     ANESTHESIA:  Spinal.      SPECIMENS:  None.      COMPLICATIONS:  None.      BLOOD LOSS:  400 cc     DRAINS: None.      INDICATION OF THE PROCEDURE:  Vernon Bishop is a 72 y.o. male who had   presented to office for evaluation of right hip pain.  Radiographs revealed   progressive degenerative changes with bone-on-bone   articulation to the  hip joint.  The patient had painful limited range of   motion significantly affecting their overall quality of life.  The patient was failing to    respond to conservative measures, and at this point was ready   to proceed with more definitive measures.  The patient has noted progressive   degenerative changes in his hip, progressive problems and dysfunction   with regarding the hip prior to surgery.  Consent was obtained for   benefit of pain relief.  Specific risk of infection, DVT, component   failure, dislocation, need for revision surgery, as well discussion of   the anterior versus posterior approach were reviewed.  Consent was   obtained for benefit of anterior pain relief through an anterior   approach.      PROCEDURE IN DETAIL:  The patient was brought to operative theater.   Once adequate anesthesia, preoperative  antibiotics, 2gm Ancef administered.   The patient was positioned supine on the OSI Hanna table.  Once adequate   padding of boney process was carried out, we had predraped out the hip, and  used fluoroscopy to confirm orientation of the pelvis and position.      The right hip was then prepped and draped from proximal iliac crest to   mid thigh with shower curtain technique.      Time-out was performed identifying the patient, planned procedure, and   extremity.     An incision was then made 2 cm distal and lateral to the   anterior superior iliac spine extending over the orientation of the   tensor fascia lata muscle and sharp dissection was carried down to the   fascia of the muscle and protractor placed in the soft tissues.      The fascia was then incised.  The muscle belly was identified and swept   laterally and retractor  placed along the superior neck.  Following   cauterization of the circumflex vessels and removing some pericapsular   fat, a second cobra retractor was placed on the inferior neck.  A third   retractor was placed on the anterior acetabulum after elevating the   anterior rectus.  A L-capsulotomy was along the line of the   superior neck to the trochanteric fossa, then extended proximally and   distally.  Tag sutures were placed and the retractors were then placed   intracapsular.  We then identified the trochanteric fossa and   orientation of my neck cut, confirmed this radiographically   and then made a neck osteotomy with the femur on traction.  The femoral   head was removed without difficulty or complication.  Traction was let   off and retractors were placed posterior and anterior around the   acetabulum.      The labrum and foveal tissue were debrided.  I began reaming with a 49mm   reamer and reamed up to 55mm reamer with good bony bed preparation and a 56   cup was chosen.  The final 56mm Pinnacle cup was then impacted under fluoroscopy  to confirm the  depth of penetration and orientation with respect to   abduction.  A screw was placed followed by the hole eliminator.  The final   36+4 neutral Altrex liner was impacted with good visualized rim fit.  The cup was positioned anatomically within the acetabular portion of the pelvis.      At this point, the femur was rolled at 80 degrees.  Further capsule was   released off the inferior aspect of the femoral neck.  I then   released the superior capsule proximally.  The hook was placed laterally   along the femur and elevated manually and held in position with the bed   hook.  The leg was then extended and adducted with the leg rolled to 100   degrees of external rotation.  Once the proximal femur was fully   exposed, I used a box osteotome to set orientation.  I then began   broaching with the starting chili pepper broach and passed this by hand and then broached up to 7.  With the 7 broach in place I chose a high offset neck and did a trial reduction.  The offset was appropriate, leg lengths   appeared to be equal, confirmed radiographically.   Given these findings, I went ahead and dislocated the hip, repositioned all   retractors and positioned the right hip in the extended and abducted position.  The final 7 Hi Tri Lock stem was   chosen and it was impacted down to the level of neck cut.  Based on this   and the trial reduction, a 36+1.5 Articuleze metal ball was chosen and   impacted onto a clean and dry trunnion, and the hip was reduced.  The   hip had been irrigated throughout the case again at this point.  I did   reapproximate the superior capsular leaflet to the anterior leaflet   using #1 Vicryl.  The fascia of the   tensor fascia lata muscle was then reapproximated using #1 Vicryl and #0 V-lock sutures.  The   remaining wound was closed with 2-0 Vicryl and running 4-0 Monocryl.   The hip was cleaned, dried, and dressed sterilely using Dermabond and   Aquacel dressing.  She was  then brought   to recovery room in stable condition  tolerating the procedure well.    Leilani Able, PA-C was present for the entirety of the case involved from   preoperative positioning, perioperative retractor management, general   facilitation of the case, as well as primary wound closure as assistant.            Madlyn Frankel Charlann Boxer, M.D.        03/14/2013 8:48 AM

## 2013-03-14 NOTE — Progress Notes (Signed)
Utilization review completed.  

## 2013-03-14 NOTE — Progress Notes (Signed)
Dr. Acey Lav made aware of patient's heart rates being in the 40s- Repeat CBG results in PACU- 60 - patient to drink juice in PACU-  Repeat CBG IN 30 MINUTES ON FLOOR

## 2013-03-14 NOTE — Anesthesia Postprocedure Evaluation (Signed)
  Anesthesia Post-op Note  Patient: Vernon Bishop  Procedure(s) Performed: Procedure(s) (LRB): RIGHT TOTAL HIP ARTHROPLASTY ANTERIOR APPROACH (Right)  Patient Location: PACU  Anesthesia Type: Spinal  Level of Consciousness: awake and alert   Airway and Oxygen Therapy: Patient Spontanous Breathing  Post-op Pain: mild  Post-op Assessment: Post-op Vital signs reviewed, Patient's Cardiovascular Status Stable, Respiratory Function Stable, Patent Airway and No signs of Nausea or vomiting  Last Vitals:  Filed Vitals:   03/14/13 1544  BP:   Pulse:   Temp:   Resp: 18    Post-op Vital Signs: stable   Complications: No apparent anesthesia complications

## 2013-03-15 DIAGNOSIS — E663 Overweight: Secondary | ICD-10-CM

## 2013-03-15 LAB — BASIC METABOLIC PANEL
BUN: 9 mg/dL (ref 6–23)
CO2: 28 mEq/L (ref 19–32)
Chloride: 97 mEq/L (ref 96–112)
GFR calc non Af Amer: 82 mL/min — ABNORMAL LOW (ref >90)
Glucose, Bld: 105 mg/dL — ABNORMAL HIGH (ref 70–99)
Potassium: 3.7 mEq/L (ref 3.5–5.1)
Sodium: 134 mEq/L — ABNORMAL LOW (ref 135–145)

## 2013-03-15 LAB — CBC
Hemoglobin: 13.3 g/dL (ref 13.0–17.0)
MCHC: 33.8 g/dL (ref 30.0–36.0)
Platelets: 157 10*3/uL (ref 150–400)
RBC: 4.5 MIL/uL (ref 4.22–5.81)
WBC: 7.4 10*3/uL (ref 4.0–10.5)

## 2013-03-15 MED ORDER — ASPIRIN 325 MG PO TBEC: 325.0000 mg | DELAYED_RELEASE_TABLET | Freq: Two times a day (BID) | ORAL | Status: AC

## 2013-03-15 MED ORDER — FERROUS SULFATE 325 (65 FE) MG PO TABS: 325.0000 mg | ORAL_TABLET | Freq: Three times a day (TID) | ORAL | Status: DC

## 2013-03-15 MED ORDER — ACETAMINOPHEN 500 MG PO TABS: 1000.0000 mg | ORAL_TABLET | Freq: Three times a day (TID) | ORAL | Status: DC | PRN

## 2013-03-15 MED ORDER — POLYETHYLENE GLYCOL 3350 17 G PO PACK: 17.0000 g | PACK | Freq: Two times a day (BID) | ORAL | Status: DC

## 2013-03-15 MED ORDER — OXYCODONE HCL 15 MG PO TABS: 15.0000 mg | ORAL_TABLET | ORAL | Status: DC | PRN

## 2013-03-15 MED ORDER — CYCLOBENZAPRINE HCL 10 MG PO TABS: 10.0000 mg | ORAL_TABLET | Freq: Three times a day (TID) | ORAL | Status: DC | PRN

## 2013-03-15 MED ORDER — DSS 100 MG PO CAPS: 100.0000 mg | ORAL_CAPSULE | Freq: Two times a day (BID) | ORAL | Status: DC

## 2013-03-15 NOTE — Progress Notes (Signed)
Physical Therapy Treatment Patient Details Name: Vernon Bishop MRN: [id number] DOB: 11/12/1940 Today's Date: 03/15/2013 Time: 4098-1191 PT Time Calculation (min): 27 min  PT Assessment / Plan / Recommendation  History of Present Illness R direct anterior THA    PT Comments   Pt ambulated in hallway, educated on safe stair technique and performed exercises.  Pt provided with HEP handout.  Pt had no further questions/concerns and feels ready for d/c home today.    Follow Up Recommendations  Home health PT     Does the patient have the potential to tolerate intense rehabilitation     Barriers to Discharge        Equipment Recommendations  Rolling walker with 5" wheels    Recommendations for Other Services    Frequency 7X/week   Progress towards PT Goals Progress towards PT goals: Progressing toward goals  Plan Current plan remains appropriate    Precautions / Restrictions Precautions Precautions: None Restrictions Weight Bearing Restrictions: No Other Position/Activity Restrictions: WBAT   Pertinent Vitals/Pain 6-7/10 R hip pain (pt states that's really good for him), ice pack reapplied end of session    Mobility  Bed Mobility Bed Mobility: Not assessed Details for Bed Mobility Assistance: pt up in recliner on arrival Transfers Transfers: Sit to Stand;Stand to Sit Sit to Stand: With upper extremity assist;5: Supervision;From chair/3-in-1 Stand to Sit: With upper extremity assist;To chair/3-in-1;5: Supervision Details for Transfer Assistance: verbal cues for hand placement Ambulation/Gait Ambulation/Gait Assistance: 4: Min guard;5: Supervision Ambulation Distance (Feet): 350 Feet Assistive device: Rolling walker Ambulation/Gait Assistance Details: verbal cues for decreasing bil toe in (pt reports pigeon toed prior to surgery), step length, RW distance, posture Gait Pattern: Step-to pattern;Step-through pattern;Antalgic Gait velocity: decr Stairs: Yes Stairs  Assistance: 4: Min guard Stairs Assistance Details (indicate cue type and reason): verbal cues for safety and sequence, pt agreeable to have person assist home Stair Management Technique: Step to pattern;Forwards;One rail Left Number of Stairs: 4    Exercises Total Joint Exercises Ankle Circles/Pumps: AROM;Both;15 reps Quad Sets: AROM;Both;15 reps Gluteal Sets: AROM;Both;15 reps Heel Slides: AAROM;Right;15 reps Hip ABduction/ADduction: AROM;15 reps;Right Straight Leg Raises: AAROM;Right;15 reps Long Arc Quad: AROM;Right;15 reps;Seated   PT Diagnosis:    PT Problem List:   PT Treatment Interventions:     PT Goals (current goals can now be found in the care plan section) Acute Rehab PT Goals Patient Stated Goal: home when ready  Visit Information  Last PT Received On: 03/15/13 Assistance Needed: +1 History of Present Illness: R direct anterior THA     Subjective Data  Patient Stated Goal: home when ready   Cognition  Cognition Arousal/Alertness: Awake/alert Behavior During Therapy: WFL for tasks assessed/performed Overall Cognitive Status: Within Functional Limits for tasks assessed    Balance  Balance Balance Assessed: Yes Dynamic Standing Balance Dynamic Standing - Level of Assistance: 5: Stand by assistance  End of Session PT - End of Session Activity Tolerance: Patient tolerated treatment well Patient left: with call bell/phone within reach;in chair   GP     Knut Rondinelli,[name] 03/15/2013, 12:27 PM Zenovia Jarred, PT, DPT 03/15/2013 Pager: [phone]

## 2013-03-15 NOTE — Progress Notes (Signed)
   Subjective: 1 Day Post-Op Procedure(s) (LRB): RIGHT TOTAL HIP ARTHROPLASTY ANTERIOR APPROACH (Right)   Patient reports pain as mild, pain controlled. No events throughout the night. Ready to be discharged home if pain stays controlled and does well with PT.  Objective:   VITALS:   Filed Vitals:   03/15/13 0441  BP: 157/67  Pulse: 64  Temp: 100.4 F (38 C)  Resp: 16    Neurovascular intact Dorsiflexion/Plantar flexion intact Incision: dressing C/D/I No cellulitis present Compartment soft  LABS  Recent Labs  03/15/13 0420  HGB 13.3  HCT 39.3  WBC 7.4  PLT 157     Recent Labs  03/15/13 0420  NA 134*  K 3.7  BUN 9  CREATININE 0.92  GLUCOSE 105*     Assessment/Plan: 1 Day Post-Op Procedure(s) (LRB): RIGHT TOTAL HIP ARTHROPLASTY ANTERIOR APPROACH (Right) Advance diet Up with therapy D/C IV fluids Discharge home with home health Follow up in 2 weeks at [facility]. Follow up with OLIN,Kevan Prouty D in 2 weeks.  Contact information:  [facility] [address], [address] [place] [address] [phone]     Overweight (BMI 25-29.9) Estimated body mass index is 29.83 kg/(m^2) as calculated from the following:   Height as of this encounter: 6' (1.829 m).   Weight as of this encounter: 99.791 kg (220 lb). Patient also counseled that weight may inhibit the healing process Patient counseled that losing weight will help with future health issues      Anastasio Auerbach. Mosi Hannold   PAC  03/15/2013, 8:47 AM

## 2013-03-15 NOTE — Progress Notes (Signed)
CSW consulted for SNF placement. PN reviewed. PT has recommended HHPT following hospital d/c. RNCM will assist with d/c planning.  Augustine Leverette LCSW [phone] 

## 2013-03-15 NOTE — Evaluation (Signed)
Occupational Therapy Evaluation Patient Details Name: Vernon Bishop MRN: [id number] DOB: [birth year] Today's Date: 03/15/2013 Time: 4098-1191 OT Time Calculation (min): 29 min  OT Assessment / Plan / Recommendation History of present illness R direct anterior THA    Clinical Impression   Pt doing well and has assist at home. All education completed with pt including shower transfer.     OT Assessment  Patient does not need any further OT services    Follow Up Recommendations  No OT follow up;Supervision/Assistance - 24 hour    Barriers to Discharge      Equipment Recommendations  3 in 1 bedside comode    Recommendations for Other Services    Frequency       Precautions / Restrictions Precautions Precautions: None Restrictions Weight Bearing Restrictions: No Other Position/Activity Restrictions: WBAT   Pertinent Vitals/Pain 4-5/10 R hip; reposition, rest   ADL  Eating/Feeding: Independent Where Assessed - Eating/Feeding: Chair Grooming: Wash/dry hands;Set up Where Assessed - Grooming: Unsupported sitting Upper Body Bathing: Chest;Right arm;Left arm;Abdomen;Set up Where Assessed - Upper Body Bathing: Unsupported sitting Lower Body Bathing: Minimal assistance;Moderate assistance Where Assessed - Lower Body Bathing: Supported sit to stand Upper Body Dressing: Set up Where Assessed - Upper Body Dressing: Unsupported sitting Lower Body Dressing: Minimal assistance;Moderate assistance Where Assessed - Lower Body Dressing: Supported sit to stand Toilet Transfer: Geophysicist/field seismologist: Comfort height toilet;Raised toilet seat with arms (or 3-in-1 over toilet) Toileting - Clothing Manipulation and Hygiene: Min guard Where Assessed - Engineer, mining and Hygiene: Sit to stand from 3-in-1 or toilet Tub/Shower Transfer: Min guard Equipment Used: Rolling walker ADL Comments: discussed AE options but pt states wife can help at  home with LB self care. Practiced shower transfer technique and how wife can help with steadying walker. Discussed placement of 3in1 in shower for shower seat. Pt practiced toilet transfer with using higher toilet and grab bar like a vanity at home but he was a little unsteady with rising from toilet. Feel he will be safer with bilateral armrests of 3in1 to use with transitions. Pt agreeable to 3in1. Discussed safety issue to NOT pull up on walker to stand.    OT Diagnosis:    OT Problem List:   OT Treatment Interventions:     OT Goals(Current goals can be found in the care plan section) Acute Rehab OT Goals Patient Stated Goal: home when ready  Visit Information  Last OT Received On: 03/15/13 Assistance Needed: +1 History of Present Illness: R direct anterior THA        Prior Functioning     Home Living Family/patient expects to be discharged to:: Private residence Living Arrangements: Spouse/significant other Available Help at Discharge: Family Type of Home: House Home Access: Stairs to enter Secretary/administrator of Steps: 5-6 Entrance Stairs-Rails: Left;Right Home Layout: One level Home Equipment: Cane - single point;Crutches Prior Function Level of Independence: Independent Communication Communication: No difficulties         Vision/Perception     Cognition  Cognition Arousal/Alertness: Awake/alert Behavior During Therapy: WFL for tasks assessed/performed Overall Cognitive Status: Within Functional Limits for tasks assessed    Extremity/Trunk Assessment Upper Extremity Assessment Upper Extremity Assessment: Overall WFL for tasks assessed     Mobility Transfers Transfers: Sit to Stand;Stand to Sit Sit to Stand: With upper extremity assist;4: Min guard;From bed;From chair/3-in-1;4: Min assist;From toilet Stand to Sit: 4: Min guard;To chair/3-in-1;With upper extremity assist Details for Transfer Assistance: verbal cues for hand  placement.      Exercise      Balance Balance Balance Assessed: Yes Dynamic Standing Balance Dynamic Standing - Level of Assistance: 5: Stand by assistance   End of Session OT - End of Session Equipment Utilized During Treatment: Rolling walker Activity Tolerance: Patient tolerated treatment well Patient left: in chair;with call bell/phone within reach  GO     Lennox Laity [phone] 03/15/2013, 11:21 AM

## 2013-03-15 NOTE — Care Management Note (Signed)
    Page 1 of 2   03/15/2013     3:02:55 PM   CARE MANAGEMENT NOTE 03/15/2013  Patient:  JHAMARI, MARKOWICZ   Account Number:  [id number]  Date Initiated:  03/15/2013  Documentation initiated by:  Colleen Can  Subjective/Objective Assessment:   dx total hip replacemnt-anterior-right     Action/Plan:   CM spoke with patient. Plans are for him to return to his home where spouse will be caregiver. He will need rw and 3n1.   Anticipated DC Date:  03/15/2013   Anticipated DC Plan:  HOME W HOME HEALTH SERVICES  In-house referral  Clinical Social Worker      DC Planning Services  CM consult      PAC Choice  DURABLE MEDICAL EQUIPMENT  HOME HEALTH   Choice offered to / List presented to:  C-1 Patient   DME arranged  3-N-1  Levan Hurst      DME agency  [facility]     HH arranged  HH-2 PT      [facility] agency  [facility]   Status of service:  Completed, signed off Medicare Important Message given?  NA - LOS <3 / Initial given by admissions (If response is "NO", the following Medicare IM given date fields will be blank) Date Medicare IM given:   Date Additional Medicare IM given:    Discharge Disposition:    Per UR Regulation:    If discussed at Long Length of Stay Meetings, dates discussed:    Comments:

## 2013-03-16 NOTE — Progress Notes (Signed)
Discharge summary sent to payer through [facility]  

## 2013-03-20 NOTE — Discharge Summary (Signed)
Physician Discharge Summary  Patient ID: Vernon Bishop MRN: [id number] DOB/AGE: [birth year] 72 y.o.  Admit date: 03/14/2013 Discharge date: 03/15/2013   Procedures:  Procedure(s) (LRB): RIGHT TOTAL HIP ARTHROPLASTY ANTERIOR APPROACH (Right)  Attending Physician:  Dr. Durene Romans   Admission Diagnoses:   Right hip OA / pain  Discharge Diagnoses:  Active Problems:   S/P total hip arthroplasty   Overweight (BMI 25.0-29.9)  Past Medical History  Diagnosis Date  . Hypertension   . LBP (low back pain)   . Palpitations   . ED (erectile dysfunction)   . OSA on CPAP   . Hypogonadism male   . Vitamin D deficiency   . Hyperlipidemia   . Atrial fibrillation     1992  . Depression   . PTSD (post-traumatic stress disorder)   . MVP (mitral valve prolapse)   . Swallowing problem     HX OF PILLS GETTING "STUCK" IN THROAT AT TIMES  . Diabetes mellitus     type II  - PT STATES NOT ON ANY DIABETIC MEDICINES  . Osteoarthritis, hand     SEVERE LOWER BACK PAIN, AND RESTLESS LEG SYNDROME  . Osteoarthritis   . DDD (degenerative disc disease)   . DDD (degenerative disc disease)     HPI: Vernon Bishop, 72 y.o. male, has a history of pain and functional disability in the right hip(s) due to arthritis and patient has failed non-surgical conservative treatments for greater than 12 weeks to include NSAID's and/or analgesics, corticosteriod injections, use of assistive devices and activity modification. Onset of symptoms was gradual starting 1+ years ago with gradually worsening course since that time.The patient noted no past surgery on the right hip(s). Patient currently rates pain in the right hip at 10 out of 10 with activity. Patient has worsening of pain with activity and weight bearing, trendelenberg gait, pain that interfers with activities of daily living and pain with passive range of motion. Patient has evidence of periarticular osteophytes and joint space narrowing by imaging  studies. This condition presents safety issues increasing the risk of falls. There is no current active infection. Risks, benefits and expectations were discussed with the patient. Risks including but not limited to the risk of anesthesia, blood clots, nerve damage, blood vessel damage, failure of the prosthesis, infection and up to and including death. Patient understand the risks, benefits and expectations and wishes to proceed with surgery.   PCP: Sonda Primes, MD   Discharged Condition: good  Hospital Course:  Patient underwent the above stated procedure on 03/14/2013. Patient tolerated the procedure well and brought to the recovery room in good condition and subsequently to the floor.  POD #1 BP: 157/67 ; Pulse: 64 ; Temp: 100.4 F (38 C) ; Resp: 16 Patient reports pain as mild, pain controlled. No events throughout the night. Ready to be discharged home.  Neurovascular intact, dorsiflexion/plantar flexion intact, incision: dressing C/D/I, no cellulitis present and compartment soft.   LABS  Basename    HGB  13.3  HCT  39.3    Discharge Exam: General appearance: alert, cooperative and no distress Extremities: Homans sign is negative, no sign of DVT, no edema, redness or tenderness in the calves or thighs and no ulcers, gangrene or trophic changes  Disposition:    Home-Health Care Svc with follow up in 2 weeks   Follow-up Information   Follow up with Shelda Pal, MD. Schedule an appointment as soon as possible for a visit in 2 weeks.  Specialty:  Orthopedic Surgery   Contact information:   [address] [place] [address] [phone]       Discharge Orders   Future Appointments Provider Department Dept Phone   05/08/2013 2:30 PM Tresa Garter, MD [facility] [place] [phone]   11/27/2013 1:30 PM Barbaraann Share, MD  Pulmonary Care [phone]   Future Orders Complete By Expires   Call MD / Call 911  As directed      Comments:     If you experience chest pain or shortness of breath, CALL 911 and be transported to the hospital emergency room.  If you develope a fever above 101 F, pus (white drainage) or increased drainage or redness at the wound, or calf pain, call your surgeon's office.   Change dressing  As directed    Comments:     Maintain surgical dressing for 10-14 days, then replace with 4x4 guaze and tape. Keep the area dry and clean.   Constipation Prevention  As directed    Comments:     Drink plenty of fluids.  Prune juice may be helpful.  You may use a stool softener, such as Colace (over the counter) 100 mg twice a day.  Use MiraLax (over the counter) for constipation as needed.   Diet - low sodium heart healthy  As directed    Discharge instructions  As directed    Comments:     Maintain surgical dressing for 10-14 days, then replace with gauze and tape. Keep the area dry and clean until follow up. Follow up in 2 weeks at [facility]. Call with any questions or concerns.   Increase activity slowly as tolerated  As directed    TED hose  As directed    Comments:     Use stockings (TED hose) for 2 weeks on both leg(s).  You may remove them at night for sleeping.   Weight bearing as tolerated  As directed    Questions:     Laterality:     Extremity:          Medication List         acetaminophen 500 MG tablet  Commonly known as:  TYLENOL  Take 2 tablets (1,000 mg total) by mouth every 8 (eight) hours as needed for mild pain.     amLODipine 10 MG tablet  Commonly known as:  NORVASC  Take 1 tablet (10 mg total) by mouth every morning.     aspirin 325 MG EC tablet  Take 1 tablet (325 mg total) by mouth 2 (two) times daily.     buPROPion 150 MG 12 hr tablet  Commonly known as:  WELLBUTRIN SR  Take 150 mg by mouth 2 (two) times daily.     cholecalciferol 1000 UNITS tablet  Commonly known as:  VITAMIN D  Take 1,000 Units by mouth daily.     clonazePAM 1 MG tablet   Commonly known as:  KLONOPIN  Take 1 mg by mouth 2 (two) times daily.     clotrimazole-betamethasone cream  Commonly known as:  LOTRISONE  Apply 1 application topically 2 (two) times daily.     cyclobenzaprine 10 MG tablet  Commonly known as:  FLEXERIL  Take 1 tablet (10 mg total) by mouth 3 (three) times daily as needed for muscle spasms.     DSS 100 MG Caps  Take 100 mg by mouth 2 (two) times daily.     ferrous sulfate 325 (65  FE) MG tablet  Take 1 tablet (325 mg total) by mouth 3 (three) times daily after meals.     FLUoxetine 40 MG capsule  Commonly known as:  PROZAC  Take 40 mg by mouth every morning.     gabapentin 300 MG capsule  Commonly known as:  NEURONTIN  Take 300 mg by mouth 3 (three) times daily.     morphine 30 MG 12 hr tablet  Commonly known as:  MS CONTIN  Take 1 tablet (30 mg total) by mouth every 12 (twelve) hours. Please fill on or after 04/15/13     oxyCODONE 15 MG immediate release tablet  Commonly known as:  ROXICODONE  Take 1-2 tablets (15-30 mg total) by mouth every 4 (four) hours as needed for severe pain.     polyethylene glycol packet  Commonly known as:  MIRALAX / GLYCOLAX  Take 17 g by mouth 2 (two) times daily.     testosterone cypionate 100 MG/ML injection  Commonly known as:  DEPOTESTOTERONE CYPIONATE  Inject 600 mg into the muscle every 14 (fourteen) days. For IM use only     Vitamin B-12 CR 1000 MCG Tbcr  Take 1,000 mcg by mouth daily.         Signed: Anastasio Auerbach. Neesa Knapik   PAC  03/20/2013, 7:24 AM

## 2013-03-24 ENCOUNTER — Telehealth: Payer: Self-pay | Admitting: *Deleted

## 2013-03-24 MED ORDER — CLONAZEPAM 1 MG PO TABS: 1.0000 mg | ORAL_TABLET | Freq: Two times a day (BID) | ORAL | Status: DC

## 2013-03-24 NOTE — Telephone Encounter (Signed)
ok 

## 2013-03-24 NOTE — Telephone Encounter (Signed)
Refill done.  

## 2013-03-24 NOTE — Telephone Encounter (Signed)
Refill request for Clonazepam 1mg  ([name] pt) Last OV 12.8.14

## 2013-04-07 ENCOUNTER — Telehealth: Payer: Self-pay | Admitting: *Deleted

## 2013-04-07 NOTE — Telephone Encounter (Signed)
Yvonna Alanis from [facility] to confirm MD receipt of request for knee & back brace that was faxed 03/09/13.   Please advise.  CB# [phone]

## 2013-04-07 NOTE — Telephone Encounter (Signed)
Have you Seen this form?

## 2013-04-07 NOTE — Telephone Encounter (Signed)
i don't think I saw it Thx

## 2013-04-10 NOTE — Telephone Encounter (Signed)
I do not have form either. I spoke to pt- he does want the form completed. I called company. They are re faxing form to [place].

## 2013-04-11 ENCOUNTER — Other Ambulatory Visit: Payer: Self-pay | Admitting: Internal Medicine

## 2013-05-08 ENCOUNTER — Encounter: Payer: Self-pay | Admitting: Internal Medicine

## 2013-05-08 ENCOUNTER — Ambulatory Visit (INDEPENDENT_AMBULATORY_CARE_PROVIDER_SITE_OTHER): Payer: Medicare Other | Admitting: Internal Medicine

## 2013-05-08 VITALS — BP 140/70 | HR 60 | Temp 98.4°F | Resp 16 | Wt 210.0 lb

## 2013-05-08 DIAGNOSIS — Z23 Encounter for immunization: Secondary | ICD-10-CM

## 2013-05-08 DIAGNOSIS — M545 Low back pain, unspecified: Secondary | ICD-10-CM

## 2013-05-08 DIAGNOSIS — E538 Deficiency of other specified B group vitamins: Secondary | ICD-10-CM

## 2013-05-08 DIAGNOSIS — E119 Type 2 diabetes mellitus without complications: Secondary | ICD-10-CM

## 2013-05-08 DIAGNOSIS — Z96649 Presence of unspecified artificial hip joint: Secondary | ICD-10-CM

## 2013-05-08 MED ORDER — OXYCODONE HCL 15 MG PO TABS: 15.0000 mg | ORAL_TABLET | ORAL | Status: DC | PRN

## 2013-05-08 MED ORDER — CLONAZEPAM 1 MG PO TABS: 1.0000 mg | ORAL_TABLET | Freq: Two times a day (BID) | ORAL | Status: DC

## 2013-05-08 MED ORDER — OXYCODONE HCL 15 MG PO TABS: 15.0000 mg | ORAL_TABLET | Freq: Four times a day (QID) | ORAL | Status: DC | PRN

## 2013-05-08 MED ORDER — BUPROPION HCL ER (SR) 150 MG PO TB12: 150.0000 mg | ORAL_TABLET | Freq: Two times a day (BID) | ORAL | Status: DC

## 2013-05-08 MED ORDER — MORPHINE SULFATE ER 30 MG PO TBCR: 30.0000 mg | EXTENDED_RELEASE_TABLET | Freq: Two times a day (BID) | ORAL | Status: DC

## 2013-05-08 MED ORDER — CYCLOBENZAPRINE HCL 10 MG PO TABS: 10.0000 mg | ORAL_TABLET | Freq: Three times a day (TID) | ORAL | Status: DC | PRN

## 2013-05-08 MED ORDER — TESTOSTERONE CYPIONATE 100 MG/ML IM SOLN: 600.0000 mg | INTRAMUSCULAR | Status: DC

## 2013-05-08 NOTE — Progress Notes (Signed)
Subjective:    HPI  He had a THR R 03/14/13 -- Dr Alvan Dame  F/u lightheadedness when getting up - resolved. HR - mid-50s F/u low HR  F/u swelling - resolved. F/u R hip pain - long time - better now.  The patient is here to follow up on chronic depression, anxiety, headaches and chronic moderate-severe LBP - worse and PMR symptoms. Pain is not well controlled.  He was not better on Prednisone and he stopped it. He is using a CPAP machine  F/u on low Na  Wt Readings from Last 3 Encounters:  05/08/13 210 lb (95.255 kg)  03/14/13 220 lb (99.791 kg)  03/14/13 220 lb (99.791 kg)   BP Readings from Last 3 Encounters:  05/08/13 140/70  03/15/13 157/67  03/15/13 157/67   BP 140/70  Pulse 60  Temp(Src) 98.4 F (36.9 C) (Oral)  Resp 16  Wt 210 lb (95.255 kg)  Current Outpatient Prescriptions on File Prior to Visit  Medication Sig Dispense Refill  . acetaminophen (TYLENOL) 500 MG tablet Take 2 tablets (1,000 mg total) by mouth every 8 (eight) hours as needed for mild pain.  30 tablet  0  . amLODipine (NORVASC) 10 MG tablet Take 1 tablet (10 mg total) by mouth every morning.  30 tablet  11  . buPROPion (WELLBUTRIN SR) 150 MG 12 hr tablet Take 150 mg by mouth 2 (two) times daily.      . Cholecalciferol (VITAMIN D3) 1000 UNIT tablet Take 1,000 Units by mouth daily.        . clonazePAM (KLONOPIN) 1 MG tablet Take 1 tablet (1 mg total) by mouth 2 (two) times daily.  60 tablet  0  . clotrimazole-betamethasone (LOTRISONE) cream Apply 1 application topically 2 (two) times daily.      . Cyanocobalamin (VITAMIN B-12 CR) 1000 MCG TBCR Take 1,000 mcg by mouth daily.        . cyclobenzaprine (FLEXERIL) 10 MG tablet Take 1 tablet (10 mg total) by mouth 3 (three) times daily as needed for muscle spasms.  40 tablet  0  . docusate sodium 100 MG CAPS Take 100 mg by mouth 2 (two) times daily.  10 capsule  0  . ferrous sulfate 325 (65 FE) MG tablet Take 1 tablet (325 mg total) by mouth 3 (three) times  daily after meals.    3  . FLUoxetine (PROZAC) 40 MG capsule Take 40 mg by mouth every morning.      . gabapentin (NEURONTIN) 300 MG capsule Take 300 mg by mouth 3 (three) times daily.      Marland Kitchen morphine (MS CONTIN) 30 MG 12 hr tablet Take 1 tablet (30 mg total) by mouth every 12 (twelve) hours. Please fill on or after 04/15/13  60 tablet  0  . oxyCODONE (ROXICODONE) 15 MG immediate release tablet Take 1-2 tablets (15-30 mg total) by mouth every 4 (four) hours as needed for severe pain.  100 tablet  0  . polyethylene glycol (MIRALAX / GLYCOLAX) packet Take 17 g by mouth 2 (two) times daily.  14 each  0  . propranolol (INDERAL) 80 MG tablet TAKE 1 TABLET BY MOUTH 3 TIMES DAILY.  270 tablet  1  . testosterone cypionate (DEPOTESTOTERONE CYPIONATE) 100 MG/ML injection Inject 600 mg into the muscle every 14 (fourteen) days. For IM use only       No current facility-administered medications on file prior to visit.   Past Medical History  Diagnosis Date  . Hypertension   .  LBP (low back pain)   . Palpitations   . ED (erectile dysfunction)   . OSA on CPAP   . Hypogonadism male   . Vitamin D deficiency   . Hyperlipidemia   . Atrial fibrillation     1992  . Depression   . PTSD (post-traumatic stress disorder)   . MVP (mitral valve prolapse)   . Swallowing problem     HX OF PILLS GETTING "STUCK" IN THROAT AT TIMES  . Diabetes mellitus     type II  - PT STATES NOT ON ANY DIABETIC MEDICINES  . Osteoarthritis, hand     SEVERE LOWER BACK PAIN, AND RESTLESS LEG SYNDROME  . Osteoarthritis   . DDD (degenerative disc disease)   . DDD (degenerative disc disease)    Past Surgical History  Procedure Laterality Date  . Back surgery  1987  . Cervical laminectomy  2004    [name], rod was placed- HAS ROM LIMITATIONS  . Appendectomy    . Cholecystectomy    . Colonoscopy    . Knee surgery      left knee/arthroscopic  . Total hip arthroplasty Right 03/14/2013    Procedure: RIGHT TOTAL HIP ARTHROPLASTY  ANTERIOR APPROACH;  Surgeon: [name], MD;  Location: [facility];  Service: Orthopedics;  Laterality: Right;    reports that he quit smoking about 42 years ago. His smoking use included Cigarettes. He has a 12 pack-year smoking history. He does not have any smokeless tobacco history on file. He reports that he drinks about 0.5 ounces of alcohol per week. He reports that he does not use illicit drugs. family history includes Allergies in his mother; Coronary artery disease in his other; Diabetes in his other; Esophageal cancer in his maternal aunt; Heart attack (age of onset: 41) in his father; Heart disease in his brother, father, and mother; Hypertension in his other; Other in his paternal grandmother; Rheum arthritis in his paternal grandmother; Stroke in his mother. There is no history of Colon cancer, Rectal cancer, or Stomach cancer. Allergies  Allergen Reactions  . Cymbalta [Duloxetine Hcl]     constipation  . Digoxin And Related     HR 41  . Duloxetine     REACTION: constipation  . Prednisolone     Edema, wt gain  . Temazepam      Review of Systems  Constitutional: Positive for fatigue and unexpected weight change. Negative for chills and appetite change.  HENT: Negative for congestion, nosebleeds, sneezing, sore throat and trouble swallowing.   Eyes: Negative for itching and visual disturbance.  Respiratory: Negative for cough and shortness of breath.   Cardiovascular: Negative for chest pain, palpitations and leg swelling.  Gastrointestinal: Negative for nausea, diarrhea, blood in stool and abdominal distention.  Genitourinary: Negative for frequency and hematuria.  Musculoskeletal: Positive for arthralgias (better) and back pain. Negative for gait problem, joint swelling and neck pain.  Skin: Negative for rash.  Neurological: Negative for dizziness, tremors, speech difficulty, weakness, light-headedness and headaches.  Psychiatric/Behavioral: Positive for sleep disturbance  and dysphoric mood. Negative for suicidal ideas and agitation. The patient is not nervous/anxious.        Objective:   Physical Exam  Constitutional: He is oriented to person, place, and time. He appears well-developed.  obese  HENT:  Mouth/Throat: Oropharynx is clear and moist.  Eyes: Conjunctivae are normal. Pupils are equal, round, and reactive to light.  Neck: Normal range of motion. No JVD present. No thyromegaly present.  Cardiovascular: Regular  rhythm, normal heart sounds and intact distal pulses.  Exam reveals no gallop and no friction rub.   No murmur heard. brady  Pulmonary/Chest: Effort normal and breath sounds normal. No respiratory distress. He has no wheezes. He has no rales. He exhibits no tenderness.  Abdominal: Soft. Bowel sounds are normal. He exhibits no distension and no mass. There is no tenderness. There is no rebound and no guarding.  Musculoskeletal: Normal range of motion. He exhibits tenderness (all spine). He exhibits no edema.  L anterior prox arm sq lump  Lymphadenopathy:    He has no cervical adenopathy.  Neurological: He is alert and oriented to person, place, and time. He has normal reflexes. No cranial nerve deficit. He exhibits normal muscle tone. Coordination normal.  Skin: Skin is warm and dry. No rash noted.  Psychiatric: His behavior is normal. Judgment and thought content normal.  Not depressed, not suicidal or homicidal  LS is tender and stiff  Lab Results  Component Value Date   WBC 7.4 03/15/2013   HGB 13.3 03/15/2013   HCT 39.3 03/15/2013   PLT 157 03/15/2013   GLUCOSE 105* 03/15/2013   CHOL 128 12/09/2009   TRIG 134.0 12/09/2009   HDL 20.40* 12/09/2009   LDLCALC 81 12/09/2009   ALT 19 11/01/2012   AST 21 11/01/2012   NA 134* 03/15/2013   K 3.7 03/15/2013   CL 97 03/15/2013   CREATININE 0.92 03/15/2013   BUN 9 03/15/2013   CO2 28 03/15/2013   TSH 0.70 11/01/2012   PSA 0.57 12/09/2009   INR 1.00 03/07/2013   HGBA1C 5.3 10/16/2011           Assessment & Plan:

## 2013-05-08 NOTE — Assessment & Plan Note (Signed)
He had a THR R 03/14/13 -- Dr Alvan Dame Doing well

## 2013-05-08 NOTE — Assessment & Plan Note (Signed)
Continue with current prescription therapy as reflected on the Med list.  

## 2013-05-08 NOTE — Assessment & Plan Note (Signed)
Wt Readings from Last 3 Encounters:  05/08/13 210 lb (95.255 kg)  03/14/13 220 lb (99.791 kg)  03/14/13 220 lb (99.791 kg)  doing good

## 2013-05-08 NOTE — Progress Notes (Signed)
Pre visit review using our clinic review tool, if applicable. No additional management support is needed unless otherwise documented below in the visit note. 

## 2013-05-08 NOTE — Addendum Note (Signed)
Addended by: Cresenciano Lick on: 05/08/2013 04:37 PM   Modules accepted: Orders

## 2013-05-11 ENCOUNTER — Telehealth: Payer: Self-pay

## 2013-05-11 NOTE — Telephone Encounter (Signed)
Relevant patient education assigned to patient using Emmi. ° °

## 2013-06-13 ENCOUNTER — Telehealth: Payer: Self-pay | Admitting: *Deleted

## 2013-06-13 MED ORDER — MORPHINE SULFATE ER 30 MG PO TBCR: 30.0000 mg | EXTENDED_RELEASE_TABLET | Freq: Two times a day (BID) | ORAL | Status: DC

## 2013-06-13 MED ORDER — OXYCODONE HCL 15 MG PO TABS: 15.0000 mg | ORAL_TABLET | Freq: Four times a day (QID) | ORAL | Status: DC | PRN

## 2013-06-13 NOTE — Telephone Encounter (Signed)
Ok to restore per MD. Rx preinted/signed/given to pt

## 2013-06-13 NOTE — Telephone Encounter (Signed)
Pt called states he lost his pain medication Rx.  Pt is requesting a refill.  Please advise

## 2013-07-07 ENCOUNTER — Ambulatory Visit (INDEPENDENT_AMBULATORY_CARE_PROVIDER_SITE_OTHER): Payer: Medicare Other | Admitting: Internal Medicine

## 2013-07-07 ENCOUNTER — Encounter: Payer: Self-pay | Admitting: Internal Medicine

## 2013-07-07 ENCOUNTER — Telehealth: Payer: Self-pay | Admitting: *Deleted

## 2013-07-07 VITALS — BP 120/74 | HR 47 | Temp 97.6°F | Ht 72.0 in | Wt 220.0 lb

## 2013-07-07 DIAGNOSIS — E538 Deficiency of other specified B group vitamins: Secondary | ICD-10-CM

## 2013-07-07 DIAGNOSIS — E119 Type 2 diabetes mellitus without complications: Secondary | ICD-10-CM

## 2013-07-07 DIAGNOSIS — M545 Low back pain, unspecified: Secondary | ICD-10-CM

## 2013-07-07 DIAGNOSIS — I10 Essential (primary) hypertension: Secondary | ICD-10-CM

## 2013-07-07 MED ORDER — TIZANIDINE HCL 4 MG PO TABS: 4.0000 mg | ORAL_TABLET | Freq: Three times a day (TID) | ORAL | Status: DC | PRN

## 2013-07-07 MED ORDER — MORPHINE SULFATE ER 60 MG PO TBCR: 60.0000 mg | EXTENDED_RELEASE_TABLET | Freq: Two times a day (BID) | ORAL | Status: DC

## 2013-07-07 MED ORDER — OXYCODONE HCL 15 MG PO TABS: 15.0000 mg | ORAL_TABLET | Freq: Four times a day (QID) | ORAL | Status: DC | PRN

## 2013-07-07 MED ORDER — MORPHINE SULFATE ER 60 MG PO TBCR
60.0000 mg | EXTENDED_RELEASE_TABLET | Freq: Two times a day (BID) | ORAL | Status: DC
Start: 2013-07-07 — End: 2013-10-10

## 2013-07-07 NOTE — Assessment & Plan Note (Signed)
Wore. Increased MS contin to 60 mg bid RTC 3 mo

## 2013-07-07 NOTE — Progress Notes (Signed)
Pre visit review using our clinic review tool, if applicable. No additional management support is needed unless otherwise documented below in the visit note. 

## 2013-07-07 NOTE — Telephone Encounter (Signed)
Needs testosterone pa

## 2013-07-07 NOTE — Progress Notes (Signed)
Subjective:    HPI  He had a THR R 03/14/13 -- Dr Alvan Dame  F/u lightheadedness when getting up - resolved. HR - mid-50s F/u low HR  F/u swelling - resolved. F/u R hip pain - long time - better now.  The patient is here to follow up on chronic depression, anxiety, headaches and chronic moderate-severe LBP - worse and PMR symptoms. Pain is not controlled - worse.  He was not better on Prednisone and he stopped it. He is using a CPAP machine  F/u on low Na  Wt Readings from Last 3 Encounters:  07/07/13 220 lb (99.791 kg)  05/08/13 210 lb (95.255 kg)  03/14/13 220 lb (99.791 kg)   BP Readings from Last 3 Encounters:  07/07/13 120/74  05/08/13 140/70  03/15/13 157/67   BP 120/74  Pulse 47  Temp(Src) 97.6 F (36.4 C) (Oral)  Ht 6' (1.829 m)  Wt 220 lb (99.791 kg)  BMI 29.83 kg/m2  SpO2 98%  Current Outpatient Prescriptions on File Prior to Visit  Medication Sig Dispense Refill  . acetaminophen (TYLENOL) 500 MG tablet Take 2 tablets (1,000 mg total) by mouth every 8 (eight) hours as needed for mild pain.  30 tablet  0  . amLODipine (NORVASC) 10 MG tablet Take 1 tablet (10 mg total) by mouth every morning.  30 tablet  11  . buPROPion (WELLBUTRIN SR) 150 MG 12 hr tablet Take 1 tablet (150 mg total) by mouth 2 (two) times daily.  60 tablet  5  . Cholecalciferol (VITAMIN D3) 1000 UNIT tablet Take 1,000 Units by mouth daily.        . clonazePAM (KLONOPIN) 1 MG tablet Take 1 tablet (1 mg total) by mouth 2 (two) times daily.  60 tablet  5  . clotrimazole-betamethasone (LOTRISONE) cream Apply 1 application topically 2 (two) times daily.      . Cyanocobalamin (VITAMIN B-12 CR) 1000 MCG TBCR Take 1,000 mcg by mouth daily.        Marland Kitchen docusate sodium 100 MG CAPS Take 100 mg by mouth 2 (two) times daily.  10 capsule  0  . ferrous sulfate 325 (65 FE) MG tablet Take 1 tablet (325 mg total) by mouth 3 (three) times daily after meals.    3  . FLUoxetine (PROZAC) 40 MG capsule Take 40 mg by mouth  every morning.      . gabapentin (NEURONTIN) 300 MG capsule Take 300 mg by mouth 3 (three) times daily.      . polyethylene glycol (MIRALAX / GLYCOLAX) packet Take 17 g by mouth 2 (two) times daily.  14 each  0  . propranolol (INDERAL) 80 MG tablet TAKE 1 TABLET BY MOUTH 3 TIMES DAILY.  270 tablet  1  . testosterone cypionate (DEPOTESTOTERONE CYPIONATE) 100 MG/ML injection Inject 6 mLs (600 mg total) into the muscle every 14 (fourteen) days. For IM use only  10 mL  3   No current facility-administered medications on file prior to visit.   Past Medical History  Diagnosis Date  . Hypertension   . LBP (low back pain)   . Palpitations   . ED (erectile dysfunction)   . OSA on CPAP   . Hypogonadism male   . Vitamin D deficiency   . Hyperlipidemia   . Atrial fibrillation     1992  . Depression   . PTSD (post-traumatic stress disorder)   . MVP (mitral valve prolapse)   . Swallowing problem     HX  OF PILLS GETTING "STUCK" IN THROAT AT TIMES  . Diabetes mellitus     type II  - PT STATES NOT ON ANY DIABETIC MEDICINES  . Osteoarthritis, hand     SEVERE LOWER BACK PAIN, AND RESTLESS LEG SYNDROME  . Osteoarthritis   . DDD (degenerative disc disease)   . DDD (degenerative disc disease)    Past Surgical History  Procedure Laterality Date  . Back surgery  1987  . Cervical laminectomy  2004    [name], rod was placed- HAS ROM LIMITATIONS  . Appendectomy    . Cholecystectomy    . Colonoscopy    . Knee surgery      left knee/arthroscopic  . Total hip arthroplasty Right 03/14/2013    Procedure: RIGHT TOTAL HIP ARTHROPLASTY ANTERIOR APPROACH;  Surgeon: [name], MD;  Location: [facility];  Service: Orthopedics;  Laterality: Right;    reports that he quit smoking about 42 years ago. His smoking use included Cigarettes. He has a 12 pack-year smoking history. He does not have any smokeless tobacco history on file. He reports that he drinks about .5 ounces of alcohol per week. He reports that he  does not use illicit drugs. family history includes Allergies in his mother; Coronary artery disease in his other; Diabetes in his other; Esophageal cancer in his maternal aunt; Heart attack (age of onset: 38) in his father; Heart disease in his brother, father, and mother; Hypertension in his other; Other in his paternal grandmother; Rheum arthritis in his paternal grandmother; Stroke in his mother. There is no history of Colon cancer, Rectal cancer, or Stomach cancer. Allergies  Allergen Reactions  . Cymbalta [Duloxetine Hcl]     constipation  . Digoxin And Related     HR 41  . Duloxetine     REACTION: constipation  . Prednisolone     Edema, wt gain  . Temazepam      Review of Systems  Constitutional: Positive for fatigue and unexpected weight change. Negative for chills and appetite change.  HENT: Negative for congestion, nosebleeds, sneezing, sore throat and trouble swallowing.   Eyes: Negative for itching and visual disturbance.  Respiratory: Negative for cough and shortness of breath.   Cardiovascular: Negative for chest pain, palpitations and leg swelling.  Gastrointestinal: Negative for nausea, diarrhea, blood in stool and abdominal distention.  Genitourinary: Negative for frequency and hematuria.  Musculoskeletal: Positive for arthralgias (better) and back pain. Negative for gait problem, joint swelling and neck pain.  Skin: Negative for rash.  Neurological: Negative for dizziness, tremors, speech difficulty, weakness, light-headedness and headaches.  Psychiatric/Behavioral: Positive for sleep disturbance and dysphoric mood. Negative for suicidal ideas and agitation. The patient is not nervous/anxious.        Objective:   Physical Exam  Constitutional: He is oriented to person, place, and time. He appears well-developed.  obese  HENT:  Mouth/Throat: Oropharynx is clear and moist.  Eyes: Conjunctivae are normal. Pupils are equal, round, and reactive to light.  Neck:  Normal range of motion. No JVD present. No thyromegaly present.  Cardiovascular: Regular rhythm, normal heart sounds and intact distal pulses.  Exam reveals no gallop and no friction rub.   No murmur heard. brady  Pulmonary/Chest: Effort normal and breath sounds normal. No respiratory distress. He has no wheezes. He has no rales. He exhibits no tenderness.  Abdominal: Soft. Bowel sounds are normal. He exhibits no distension and no mass. There is no tenderness. There is no rebound and no guarding.  Musculoskeletal: Normal range of motion. He exhibits tenderness (all spine). He exhibits no edema.  L anterior prox arm sq lump  Lymphadenopathy:    He has no cervical adenopathy.  Neurological: He is alert and oriented to person, place, and time. He has normal reflexes. No cranial nerve deficit. He exhibits normal muscle tone. Coordination normal.  Skin: Skin is warm and dry. No rash noted.  Psychiatric: His behavior is normal. Judgment and thought content normal.  Not depressed, not suicidal or homicidal  LS is tender and stiff  Lab Results  Component Value Date   WBC 7.4 03/15/2013   HGB 13.3 03/15/2013   HCT 39.3 03/15/2013   PLT 157 03/15/2013   GLUCOSE 105* 03/15/2013   CHOL 128 12/09/2009   TRIG 134.0 12/09/2009   HDL 20.40* 12/09/2009   LDLCALC 81 12/09/2009   ALT 19 11/01/2012   AST 21 11/01/2012   NA 134* 03/15/2013   K 3.7 03/15/2013   CL 97 03/15/2013   CREATININE 0.92 03/15/2013   BUN 9 03/15/2013   CO2 28 03/15/2013   TSH 0.70 11/01/2012   PSA 0.57 12/09/2009   INR 1.00 03/07/2013   HGBA1C 5.3 10/16/2011          Assessment & Plan:

## 2013-07-09 ENCOUNTER — Encounter: Payer: Self-pay | Admitting: Internal Medicine

## 2013-07-09 NOTE — Assessment & Plan Note (Signed)
Continue with current prescription therapy as reflected on the Med list.  

## 2013-07-27 NOTE — Telephone Encounter (Signed)
Waiting on signature on the PA. Will fax as soon as we have it.

## 2013-08-01 ENCOUNTER — Other Ambulatory Visit: Payer: Self-pay | Admitting: Internal Medicine

## 2013-08-03 ENCOUNTER — Telehealth: Payer: Self-pay | Admitting: Internal Medicine

## 2013-08-03 NOTE — Telephone Encounter (Signed)
Called pharm to let them know the PA for the Testosterone Cypionate injection was approved. Pt has informed as well.

## 2013-08-03 NOTE — Telephone Encounter (Signed)
[employer] called and states that the patient's testosterone has been approved until Aug 03, 2014.

## 2013-08-18 ENCOUNTER — Other Ambulatory Visit: Payer: Self-pay | Admitting: Internal Medicine

## 2013-10-03 ENCOUNTER — Telehealth: Payer: Self-pay

## 2013-10-03 DIAGNOSIS — E119 Type 2 diabetes mellitus without complications: Secondary | ICD-10-CM

## 2013-10-03 NOTE — Telephone Encounter (Signed)
Diabetic bundle- lipid, bmet and a1c ordered 

## 2013-10-10 ENCOUNTER — Ambulatory Visit (INDEPENDENT_AMBULATORY_CARE_PROVIDER_SITE_OTHER): Payer: Medicare Other | Admitting: Internal Medicine

## 2013-10-10 ENCOUNTER — Encounter: Payer: Self-pay | Admitting: Internal Medicine

## 2013-10-10 VITALS — BP 150/80 | HR 60 | Temp 98.5°F | Resp 16 | Wt 218.0 lb

## 2013-10-10 DIAGNOSIS — M25562 Pain in left knee: Secondary | ICD-10-CM

## 2013-10-10 DIAGNOSIS — F22 Delusional disorders: Secondary | ICD-10-CM

## 2013-10-10 DIAGNOSIS — M25569 Pain in unspecified knee: Secondary | ICD-10-CM

## 2013-10-10 DIAGNOSIS — M5442 Lumbago with sciatica, left side: Principal | ICD-10-CM

## 2013-10-10 DIAGNOSIS — M1611 Unilateral primary osteoarthritis, right hip: Secondary | ICD-10-CM

## 2013-10-10 DIAGNOSIS — I10 Essential (primary) hypertension: Secondary | ICD-10-CM

## 2013-10-10 DIAGNOSIS — E119 Type 2 diabetes mellitus without complications: Secondary | ICD-10-CM

## 2013-10-10 DIAGNOSIS — M1610 Unilateral primary osteoarthritis, unspecified hip: Secondary | ICD-10-CM

## 2013-10-10 DIAGNOSIS — M5441 Lumbago with sciatica, right side: Secondary | ICD-10-CM

## 2013-10-10 DIAGNOSIS — E538 Deficiency of other specified B group vitamins: Secondary | ICD-10-CM

## 2013-10-10 DIAGNOSIS — M542 Cervicalgia: Secondary | ICD-10-CM

## 2013-10-10 DIAGNOSIS — M5430 Sciatica, unspecified side: Secondary | ICD-10-CM

## 2013-10-10 MED ORDER — MORPHINE SULFATE ER 60 MG PO TBCR: 60.0000 mg | EXTENDED_RELEASE_TABLET | Freq: Two times a day (BID) | ORAL | Status: DC

## 2013-10-10 MED ORDER — OXYCODONE HCL 15 MG PO TABS: 15.0000 mg | ORAL_TABLET | Freq: Four times a day (QID) | ORAL | Status: DC | PRN

## 2013-10-10 NOTE — Assessment & Plan Note (Signed)
Continue with current prescription therapy as reflected on the Med list.  

## 2013-10-10 NOTE — Progress Notes (Signed)
Subjective:    HPI  He had a THR R 03/14/13 -- Dr Alvan Dame  F/u lightheadedness when getting up - resolved. HR - mid-50s F/u low HR  F/u swelling - resolved. F/u R hip pain - long time - better now.  The patient is here to follow up on chronic depression, anxiety, headaches and chronic moderate-severe LBP - worse and PMR symptoms. Pain is not controlled - worse.  He was not better on Prednisone and he stopped it. He is using a CPAP machine  F/u on low Na  Wt Readings from Last 3 Encounters:  10/10/13 218 lb (98.884 kg)  07/07/13 220 lb (99.791 kg)  05/08/13 210 lb (95.255 kg)   BP Readings from Last 3 Encounters:  10/10/13 150/80  07/07/13 120/74  05/08/13 140/70   BP 150/80  Pulse 60  Temp(Src) 98.5 F (36.9 C) (Oral)  Resp 16  Wt 218 lb (98.884 kg)  Current Outpatient Prescriptions on File Prior to Visit  Medication Sig Dispense Refill  . acetaminophen (TYLENOL) 500 MG tablet Take 2 tablets (1,000 mg total) by mouth every 8 (eight) hours as needed for mild pain.  30 tablet  0  . amLODipine (NORVASC) 10 MG tablet Take 1 tablet (10 mg total) by mouth every morning.  30 tablet  11  . benazepril-hydrochlorthiazide (LOTENSIN HCT) 20-12.5 MG per tablet TAKE 2 TABLETS BY MOUTH DAILY.  60 tablet  5  . buPROPion (WELLBUTRIN SR) 150 MG 12 hr tablet Take 1 tablet (150 mg total) by mouth 2 (two) times daily.  60 tablet  5  . Cholecalciferol (VITAMIN D3) 1000 UNIT tablet Take 1,000 Units by mouth daily.        . clonazePAM (KLONOPIN) 1 MG tablet Take 1 tablet (1 mg total) by mouth 2 (two) times daily.  60 tablet  5  . clotrimazole-betamethasone (LOTRISONE) cream Apply 1 application topically 2 (two) times daily.      . Cyanocobalamin (VITAMIN B-12 CR) 1000 MCG TBCR Take 1,000 mcg by mouth daily.        Marland Kitchen docusate sodium 100 MG CAPS Take 100 mg by mouth 2 (two) times daily.  10 capsule  0  . FLUoxetine (PROZAC) 40 MG capsule Take 40 mg by mouth every morning.      . gabapentin  (NEURONTIN) 300 MG capsule Take 300 mg by mouth 3 (three) times daily.      Marland Kitchen gabapentin (NEURONTIN) 300 MG capsule TAKE 1 CAPSULE BY MOUTH 3 TIMES DAILY.  90 capsule  3  . morphine (MS CONTIN) 60 MG 12 hr tablet Take 1 tablet (60 mg total) by mouth every 12 (twelve) hours. Please fill on or after 09/13/13  60 tablet  0  . oxyCODONE (ROXICODONE) 15 MG immediate release tablet Take 1 tablet (15 mg total) by mouth every 6 (six) hours as needed. Please fill on or after 09/13/13  120 tablet  0  . polyethylene glycol (MIRALAX / GLYCOLAX) packet Take 17 g by mouth 2 (two) times daily.  14 each  0  . propranolol (INDERAL) 80 MG tablet TAKE 1 TABLET BY MOUTH 3 TIMES DAILY.  270 tablet  1  . testosterone cypionate (DEPOTESTOTERONE CYPIONATE) 100 MG/ML injection Inject 6 mLs (600 mg total) into the muscle every 14 (fourteen) days. For IM use only  10 mL  3  . tiZANidine (ZANAFLEX) 4 MG tablet Take 1 tablet (4 mg total) by mouth every 8 (eight) hours as needed for muscle spasms.  [place]  tablet  3  . ferrous sulfate 325 (65 FE) MG tablet Take 1 tablet (325 mg total) by mouth 3 (three) times daily after meals.    3   No current facility-administered medications on file prior to visit.   Past Medical History  Diagnosis Date  . Hypertension   . LBP (low back pain)   . Palpitations   . ED (erectile dysfunction)   . OSA on CPAP   . Hypogonadism male   . Vitamin D deficiency   . Hyperlipidemia   . Atrial fibrillation     1992  . Depression   . PTSD (post-traumatic stress disorder)   . MVP (mitral valve prolapse)   . Swallowing problem     HX OF PILLS GETTING "STUCK" IN THROAT AT TIMES  . Diabetes mellitus     type II  - PT STATES NOT ON ANY DIABETIC MEDICINES  . Osteoarthritis, hand     SEVERE LOWER BACK PAIN, AND RESTLESS LEG SYNDROME  . Osteoarthritis   . DDD (degenerative disc disease)   . DDD (degenerative disc disease)    Past Surgical History  Procedure Laterality Date  . Back surgery  1987  .  Cervical laminectomy  2004    [name], rod was placed- HAS ROM LIMITATIONS  . Appendectomy    . Cholecystectomy    . Colonoscopy    . Knee surgery      left knee/arthroscopic  . Total hip arthroplasty Right 03/14/2013    Procedure: RIGHT TOTAL HIP ARTHROPLASTY ANTERIOR APPROACH;  Surgeon: [name], MD;  Location: [facility];  Service: Orthopedics;  Laterality: Right;    reports that he quit smoking about 42 years ago. His smoking use included Cigarettes. He has a 12 pack-year smoking history. He does not have any smokeless tobacco history on file. He reports that he drinks about .5 ounces of alcohol per week. He reports that he does not use illicit drugs. family history includes Allergies in his mother; Coronary artery disease in his other; Diabetes in his other; Esophageal cancer in his maternal aunt; Heart attack (age of onset: 33) in his father; Heart disease in his brother, father, and mother; Hypertension in his other; Other in his paternal grandmother; Rheum arthritis in his paternal grandmother; Stroke in his mother. There is no history of Colon cancer, Rectal cancer, or Stomach cancer. Allergies  Allergen Reactions  . Cymbalta [Duloxetine Hcl]     constipation  . Digoxin And Related     HR 41  . Duloxetine     REACTION: constipation  . Prednisolone     Edema, wt gain  . Temazepam      Review of Systems  Constitutional: Positive for fatigue and unexpected weight change. Negative for chills and appetite change.  HENT: Negative for congestion, nosebleeds, sneezing, sore throat and trouble swallowing.   Eyes: Negative for itching and visual disturbance.  Respiratory: Negative for cough and shortness of breath.   Cardiovascular: Negative for chest pain, palpitations and leg swelling.  Gastrointestinal: Negative for nausea, diarrhea, blood in stool and abdominal distention.  Genitourinary: Negative for frequency and hematuria.  Musculoskeletal: Positive for arthralgias (better)  and back pain. Negative for gait problem, joint swelling and neck pain.  Skin: Negative for rash.  Neurological: Negative for dizziness, tremors, speech difficulty, weakness, light-headedness and headaches.  Psychiatric/Behavioral: Positive for sleep disturbance and dysphoric mood. Negative for suicidal ideas and agitation. The patient is not nervous/anxious.        Objective:  Physical Exam  Constitutional: He is oriented to person, place, and time. He appears well-developed.  obese  HENT:  Mouth/Throat: Oropharynx is clear and moist.  Eyes: Conjunctivae are normal. Pupils are equal, round, and reactive to light.  Neck: Normal range of motion. No JVD present. No thyromegaly present.  Cardiovascular: Regular rhythm, normal heart sounds and intact distal pulses.  Exam reveals no gallop and no friction rub.   No murmur heard. brady  Pulmonary/Chest: Effort normal and breath sounds normal. No respiratory distress. He has no wheezes. He has no rales. He exhibits no tenderness.  Abdominal: Soft. Bowel sounds are normal. He exhibits no distension and no mass. There is no tenderness. There is no rebound and no guarding.  Musculoskeletal: Normal range of motion. He exhibits tenderness (all spine). He exhibits no edema.  L anterior prox arm sq lump  Lymphadenopathy:    He has no cervical adenopathy.  Neurological: He is alert and oriented to person, place, and time. He has normal reflexes. No cranial nerve deficit. He exhibits normal muscle tone. Coordination normal.  Skin: Skin is warm and dry. No rash noted.  Psychiatric: His behavior is normal. Judgment and thought content normal.  Not depressed, not suicidal or homicidal  LS is tender and stiff  Lab Results  Component Value Date   WBC 7.4 03/15/2013   HGB 13.3 03/15/2013   HCT 39.3 03/15/2013   PLT 157 03/15/2013   GLUCOSE 105* 03/15/2013   CHOL 128 12/09/2009   TRIG 134.0 12/09/2009   HDL 20.40* 12/09/2009   LDLCALC 81 12/09/2009    ALT 19 11/01/2012   AST 21 11/01/2012   NA 134* 03/15/2013   K 3.7 03/15/2013   CL 97 03/15/2013   CREATININE 0.92 03/15/2013   BUN 9 03/15/2013   CO2 28 03/15/2013   TSH 0.70 11/01/2012   PSA 0.57 12/09/2009   INR 1.00 03/07/2013   HGBA1C 5.3 10/16/2011    A complex case      Assessment & Plan:

## 2013-10-10 NOTE — Assessment & Plan Note (Signed)
X ray May inject

## 2013-10-10 NOTE — Assessment & Plan Note (Signed)
Doing well 

## 2013-10-10 NOTE — Assessment & Plan Note (Addendum)
Suspecting wife of having affairs x years: weekly argument Discussed w/pt and wife They'll see his wife's psychologist together

## 2013-10-10 NOTE — Progress Notes (Signed)
Pre visit review using our clinic review tool, if applicable. No additional management support is needed unless otherwise documented below in the visit note. 

## 2013-10-11 ENCOUNTER — Telehealth: Payer: Self-pay | Admitting: Internal Medicine

## 2013-10-11 NOTE — Assessment & Plan Note (Signed)
Continue with current prescription therapy as reflected on the Med list.  

## 2013-10-11 NOTE — Telephone Encounter (Signed)
Relevant patient education assigned to patient using Emmi. ° °

## 2013-11-07 ENCOUNTER — Other Ambulatory Visit: Payer: Self-pay | Admitting: *Deleted

## 2013-11-07 NOTE — Telephone Encounter (Signed)
Ok lower dose - see Rx. Use w/caution

## 2013-11-07 NOTE — Telephone Encounter (Signed)
Rf req for Clonazepam 1 mg 1 po bid. # 60. Ok to Rf?

## 2013-11-27 ENCOUNTER — Ambulatory Visit (INDEPENDENT_AMBULATORY_CARE_PROVIDER_SITE_OTHER): Payer: Medicare Other | Admitting: Pulmonary Disease

## 2013-11-27 ENCOUNTER — Encounter: Payer: Self-pay | Admitting: Pulmonary Disease

## 2013-11-27 VITALS — BP 160/78 | HR 53 | Temp 98.3°F | Ht 72.0 in | Wt 221.0 lb

## 2013-11-27 DIAGNOSIS — G4733 Obstructive sleep apnea (adult) (pediatric): Secondary | ICD-10-CM

## 2013-11-27 NOTE — Patient Instructions (Signed)
Continue on cpap, and keep up with mask changes and supplies. Work on further weight loss followup with me again in one year.

## 2013-11-27 NOTE — Assessment & Plan Note (Signed)
The patient is doing very well on the automatic setting on his device, and feels that he is sleeping adequately and is satisfied with his daytime alertness. I've asked him to continue on his CPAP, and to keep up with his mask changes and supplies. I have commended him on his weight loss, and asked him to continue.

## 2013-11-27 NOTE — Progress Notes (Signed)
   Subjective:    Patient ID: Vernon Bishop, male    DOB: 1940/10/30, 73 y.o.   MRN: [id number]  HPI Patient comes in today for followup of his obstructive sleep apnea.  He is wearing CPAP compliantly, and is having no issues with his mask fit or pressure. He is sleeping well with the device, and is satisfied with his daytime alertness. He has lost 13 pounds since last visit.   Review of Systems  Constitutional: Negative for fever and unexpected weight change.  HENT: Negative for congestion, dental problem, ear pain, nosebleeds, postnasal drip, rhinorrhea, sinus pressure, sneezing, sore throat and trouble swallowing.   Eyes: Negative for redness and itching.  Respiratory: Negative for cough, chest tightness, shortness of breath and wheezing.   Cardiovascular: Negative for palpitations and leg swelling.  Gastrointestinal: Negative for nausea and vomiting.  Genitourinary: Negative for dysuria.  Musculoskeletal: Negative for joint swelling.  Skin: Negative for rash.  Neurological: Negative for headaches.  Hematological: Does not bruise/bleed easily.  Psychiatric/Behavioral: Negative for dysphoric mood. The patient is not nervous/anxious.        Objective:   Physical Exam Well-developed male in no acute distress Nose without purulence or discharge noted Neck without lymphadenopathy or thyromegaly No skin breakdown or pressure necrosis from the CPAP mask Lower extremities without edema, no cyanosis Alert and oriented, moves all 4 extremities.       Assessment & Plan:

## 2013-12-14 ENCOUNTER — Telehealth: Payer: Self-pay

## 2013-12-14 NOTE — Telephone Encounter (Signed)
Refill requested of Clonazepam but it is noted that this rx caused an allergic reaction.  Do you still want to fill this?

## 2013-12-15 ENCOUNTER — Other Ambulatory Visit: Payer: Self-pay

## 2013-12-15 MED ORDER — CLONAZEPAM 0.5 MG PO TABS: 0.5000 mg | ORAL_TABLET | Freq: Two times a day (BID) | ORAL | Status: DC | PRN

## 2013-12-17 NOTE — Telephone Encounter (Signed)
OK to fill this prescription with additional refills x0 He had a problem w/a higher dose Thank you!

## 2014-01-03 ENCOUNTER — Other Ambulatory Visit: Payer: Self-pay | Admitting: Internal Medicine

## 2014-01-10 ENCOUNTER — Other Ambulatory Visit (INDEPENDENT_AMBULATORY_CARE_PROVIDER_SITE_OTHER): Payer: Medicare Other

## 2014-01-10 ENCOUNTER — Encounter: Payer: Self-pay | Admitting: Internal Medicine

## 2014-01-10 ENCOUNTER — Ambulatory Visit (INDEPENDENT_AMBULATORY_CARE_PROVIDER_SITE_OTHER): Payer: Medicare Other | Admitting: Internal Medicine

## 2014-01-10 VITALS — BP 140/80 | HR 42 | Temp 98.2°F | Wt 217.0 lb

## 2014-01-10 DIAGNOSIS — M5440 Lumbago with sciatica, unspecified side: Secondary | ICD-10-CM

## 2014-01-10 DIAGNOSIS — M25562 Pain in left knee: Secondary | ICD-10-CM

## 2014-01-10 DIAGNOSIS — E119 Type 2 diabetes mellitus without complications: Secondary | ICD-10-CM

## 2014-01-10 DIAGNOSIS — M545 Low back pain, unspecified: Secondary | ICD-10-CM

## 2014-01-10 DIAGNOSIS — E539 Vitamin B deficiency, unspecified: Secondary | ICD-10-CM

## 2014-01-10 DIAGNOSIS — E538 Deficiency of other specified B group vitamins: Secondary | ICD-10-CM

## 2014-01-10 DIAGNOSIS — Z23 Encounter for immunization: Secondary | ICD-10-CM

## 2014-01-10 DIAGNOSIS — I10 Essential (primary) hypertension: Secondary | ICD-10-CM

## 2014-01-10 LAB — HEPATIC FUNCTION PANEL
ALBUMIN: 3.7 g/dL (ref 3.5–5.2)
ALK PHOS: 87 U/L (ref 39–117)
ALT: 26 U/L (ref 0–53)
AST: 24 U/L (ref 0–37)
Bilirubin, Direct: 0.1 mg/dL (ref 0.0–0.3)
Total Bilirubin: 0.9 mg/dL (ref 0.2–1.2)
Total Protein: 7.8 g/dL (ref 6.0–8.3)

## 2014-01-10 LAB — BASIC METABOLIC PANEL
BUN: 12 mg/dL (ref 6–23)
CO2: 26 mEq/L (ref 19–32)
CREATININE: 0.9 mg/dL (ref 0.4–1.5)
Calcium: 9.6 mg/dL (ref 8.4–10.5)
Chloride: 100 mEq/L (ref 96–112)
GFR: 87.89 mL/min (ref >60.00)
Glucose, Bld: 81 mg/dL (ref 70–99)
Potassium: 4.1 mEq/L (ref 3.5–5.1)
Sodium: 133 mEq/L — ABNORMAL LOW (ref 135–145)

## 2014-01-10 LAB — HEMOGLOBIN A1C: Hgb A1c MFr Bld: 5.1 % (ref 4.6–6.5)

## 2014-01-10 MED ORDER — OXYCODONE HCL 15 MG PO TABS: 15.0000 mg | ORAL_TABLET | Freq: Four times a day (QID) | ORAL | Status: DC | PRN

## 2014-01-10 MED ORDER — MORPHINE SULFATE ER 60 MG PO TBCR: 60.0000 mg | EXTENDED_RELEASE_TABLET | Freq: Two times a day (BID) | ORAL | Status: DC

## 2014-01-10 NOTE — Assessment & Plan Note (Signed)
Cont w/diet  Wt Readings from Last 3 Encounters:  01/10/14 217 lb (98.431 kg)  11/27/13 221 lb (100.245 kg)  10/10/13 218 lb (98.884 kg)

## 2014-01-10 NOTE — Assessment & Plan Note (Signed)
Continue with current prescription therapy as reflected on the Med list. Chronic Potential benefits of a long term opioids use as well as potential risks (i.e. addiction risk, apnea etc) and complications (i.e. Somnolence, constipation and others) were explained to the patient and were aknowledged.

## 2014-01-10 NOTE — Progress Notes (Deleted)
Pre visit review using our clinic review tool, if applicable. No additional management support is needed unless otherwise documented below in the visit note. 

## 2014-01-10 NOTE — Assessment & Plan Note (Signed)
L knee pain - OA I filled out a form for a brace

## 2014-01-10 NOTE — Assessment & Plan Note (Signed)
Continue with current prescription therapy as reflected on the Med list.  

## 2014-01-11 ENCOUNTER — Encounter: Payer: Self-pay | Admitting: Internal Medicine

## 2014-01-11 NOTE — Progress Notes (Signed)
Subjective:    HPI  He had a THR R 03/14/13 -- Dr Alvan Dame  F/u lightheadedness when getting up - resolved. HR - mid-50s F/u low HR  The patient is here to follow up on chronic depression, anxiety, headaches and chronic moderate-severe LBP - worse and PMR symptoms. Pain is not controlled - worse.  He was not better on Prednisone and he stopped it. He is using a CPAP machine  F/u on low Na  Wt Readings from Last 3 Encounters:  01/10/14 217 lb (98.431 kg)  11/27/13 221 lb (100.245 kg)  10/10/13 218 lb (98.884 kg)   BP Readings from Last 3 Encounters:  01/10/14 140/80  11/27/13 160/78  10/10/13 150/80   BP 140/80  Pulse 42  Temp(Src) 98.2 F (36.8 C) (Oral)  Wt 217 lb (98.431 kg)  SpO2 97%  Current Outpatient Prescriptions on File Prior to Visit  Medication Sig Dispense Refill  . acetaminophen (TYLENOL) 500 MG tablet Take 2 tablets (1,000 mg total) by mouth every 8 (eight) hours as needed for mild pain.  30 tablet  0  . amLODipine (NORVASC) 10 MG tablet Take 1 tablet (10 mg total) by mouth every morning.  30 tablet  11  . benazepril-hydrochlorthiazide (LOTENSIN HCT) 20-12.5 MG per tablet TAKE 2 TABLETS BY MOUTH DAILY.  60 tablet  5  . buPROPion (WELLBUTRIN SR) 150 MG 12 hr tablet TAKE 1 TABLET BY MOUTH 2 TIMES DAILY.  60 tablet  5  . Cholecalciferol (VITAMIN D3) 1000 UNIT tablet Take 1,000 Units by mouth daily.        . clonazePAM (KLONOPIN) 0.5 MG tablet Take 1 tablet (0.5 mg total) by mouth 2 (two) times daily as needed for anxiety. Take 1/2 to 1 tab by mouth twice daily as needed for anxiety  30 tablet  1  . clotrimazole-betamethasone (LOTRISONE) cream Apply 1 application topically 2 (two) times daily.      . Cyanocobalamin (VITAMIN B-12 CR) 1000 MCG TBCR Take 1,000 mcg by mouth daily.        Marland Kitchen docusate sodium 100 MG CAPS Take 100 mg by mouth 2 (two) times daily.  10 capsule  0  . ferrous sulfate 325 (65 FE) MG tablet Take 1 tablet (325 mg total) by mouth 3 (three) times  daily after meals.    3  . FLUoxetine (PROZAC) 40 MG capsule Take 40 mg by mouth every morning.      . gabapentin (NEURONTIN) 300 MG capsule TAKE 1 CAPSULE BY MOUTH 3 TIMES DAILY.  90 capsule  3  . polyethylene glycol (MIRALAX / GLYCOLAX) packet Take 17 g by mouth 2 (two) times daily.  14 each  0  . propranolol (INDERAL) 80 MG tablet TAKE 1 TABLET BY MOUTH 3 TIMES DAILY.  270 tablet  1  . testosterone cypionate (DEPOTESTOTERONE CYPIONATE) 100 MG/ML injection Inject 6 mLs (600 mg total) into the muscle every 14 (fourteen) days. For IM use only  10 mL  3  . tiZANidine (ZANAFLEX) 4 MG tablet Take 1 tablet (4 mg total) by mouth every 8 (eight) hours as needed for muscle spasms.  60 tablet  3   No current facility-administered medications on file prior to visit.   Past Medical History  Diagnosis Date  . Hypertension   . LBP (low back pain)   . Palpitations   . ED (erectile dysfunction)   . OSA on CPAP   . Hypogonadism male   . Vitamin D deficiency   .  Hyperlipidemia   . Atrial fibrillation     1992  . Depression   . PTSD (post-traumatic stress disorder)   . MVP (mitral valve prolapse)   . Swallowing problem     HX OF PILLS GETTING "STUCK" IN THROAT AT TIMES  . Diabetes mellitus     type II  - PT STATES NOT ON ANY DIABETIC MEDICINES  . Osteoarthritis, hand     SEVERE LOWER BACK PAIN, AND RESTLESS LEG SYNDROME  . Osteoarthritis   . DDD (degenerative disc disease)   . DDD (degenerative disc disease)    Past Surgical History  Procedure Laterality Date  . Back surgery  1987  . Cervical laminectomy  2004    [name], rod was placed- HAS ROM LIMITATIONS  . Appendectomy    . Cholecystectomy    . Colonoscopy    . Knee surgery      left knee/arthroscopic  . Total hip arthroplasty Right 03/14/2013    Procedure: RIGHT TOTAL HIP ARTHROPLASTY ANTERIOR APPROACH;  Surgeon: [name], MD;  Location: [facility];  Service: Orthopedics;  Laterality: Right;    reports that he quit smoking about  42 years ago. His smoking use included Cigarettes. He has a 12 pack-year smoking history. He does not have any smokeless tobacco history on file. He reports that he drinks about .5 ounces of alcohol per week. He reports that he does not use illicit drugs. family history includes Allergies in his mother; Coronary artery disease in his other; Diabetes in his other; Esophageal cancer in his maternal aunt; Heart attack (age of onset: 20) in his father; Heart disease in his brother, father, and mother; Hypertension in his other; Other in his paternal grandmother; Rheum arthritis in his paternal grandmother; Stroke in his mother. There is no history of Colon cancer, Rectal cancer, or Stomach cancer. Allergies  Allergen Reactions  . Cymbalta [Duloxetine Hcl]     constipation  . Digoxin And Related     HR 41  . Duloxetine     REACTION: constipation  . Prednisolone     Edema, wt gain  . Temazepam      Review of Systems  Constitutional: Positive for fatigue and unexpected weight change. Negative for chills and appetite change.  HENT: Negative for congestion, nosebleeds, sneezing, sore throat and trouble swallowing.   Eyes: Negative for itching and visual disturbance.  Respiratory: Negative for cough and shortness of breath.   Cardiovascular: Negative for chest pain, palpitations and leg swelling.  Gastrointestinal: Negative for nausea, diarrhea, blood in stool and abdominal distention.  Genitourinary: Negative for frequency and hematuria.  Musculoskeletal: Positive for arthralgias (better) and back pain. Negative for gait problem, joint swelling and neck pain.  Skin: Negative for rash.  Neurological: Negative for dizziness, tremors, speech difficulty, weakness, light-headedness and headaches.  Psychiatric/Behavioral: Positive for sleep disturbance and dysphoric mood. Negative for suicidal ideas and agitation. The patient is not nervous/anxious.        Objective:   Physical Exam   Constitutional: He is oriented to person, place, and time. He appears well-developed.  obese  HENT:  Mouth/Throat: Oropharynx is clear and moist.  Eyes: Conjunctivae are normal. Pupils are equal, round, and reactive to light.  Neck: Normal range of motion. No JVD present. No thyromegaly present.  Cardiovascular: Regular rhythm, normal heart sounds and intact distal pulses.  Exam reveals no gallop and no friction rub.   No murmur heard. brady  Pulmonary/Chest: Effort normal and breath sounds normal. No respiratory distress.  He has no wheezes. He has no rales. He exhibits no tenderness.  Abdominal: Soft. Bowel sounds are normal. He exhibits no distension and no mass. There is no tenderness. There is no rebound and no guarding.  Musculoskeletal: Normal range of motion. He exhibits tenderness (all spine). He exhibits no edema.  L anterior prox arm sq lump  Lymphadenopathy:    He has no cervical adenopathy.  Neurological: He is alert and oriented to person, place, and time. He has normal reflexes. No cranial nerve deficit. He exhibits normal muscle tone. Coordination normal.  Skin: Skin is warm and dry. No rash noted.  Psychiatric: His behavior is normal. Judgment and thought content normal.  Not depressed, not suicidal or homicidal  LS is tender and stiff  Lab Results  Component Value Date   WBC 7.4 03/15/2013   HGB 13.3 03/15/2013   HCT 39.3 03/15/2013   PLT 157 03/15/2013   GLUCOSE 81 01/10/2014   CHOL 128 12/09/2009   TRIG 134.0 12/09/2009   HDL 20.40* 12/09/2009   LDLCALC 81 12/09/2009   ALT 26 01/10/2014   AST 24 01/10/2014   NA 133* 01/10/2014   K 4.1 01/10/2014   CL 100 01/10/2014   CREATININE 0.9 01/10/2014   BUN 12 01/10/2014   CO2 26 01/10/2014   TSH 0.70 11/01/2012   PSA 0.57 12/09/2009   INR 1.00 03/07/2013   HGBA1C 5.1 01/10/2014         Assessment & Plan:

## 2014-01-12 ENCOUNTER — Other Ambulatory Visit: Payer: Self-pay

## 2014-01-26 ENCOUNTER — Other Ambulatory Visit: Payer: Self-pay | Admitting: Internal Medicine

## 2014-01-29 ENCOUNTER — Other Ambulatory Visit: Payer: Self-pay | Admitting: Internal Medicine

## 2014-03-02 ENCOUNTER — Telehealth: Payer: Self-pay | Admitting: *Deleted

## 2014-03-02 MED ORDER — CLONAZEPAM 0.5 MG PO TABS: 0.5000 mg | ORAL_TABLET | Freq: Two times a day (BID) | ORAL | Status: DC | PRN

## 2014-03-02 NOTE — Telephone Encounter (Signed)
OK to fill this prescription with additional refills x1 Thank you!  

## 2014-03-02 NOTE — Telephone Encounter (Signed)
Rf req for Clonazepam 0.5 mg 1/2-1 po bid prn. # 30. Ok to Rf?

## 2014-03-02 NOTE — Telephone Encounter (Signed)
Done

## 2014-03-13 ENCOUNTER — Other Ambulatory Visit: Payer: Self-pay | Admitting: Internal Medicine

## 2014-04-02 ENCOUNTER — Other Ambulatory Visit: Payer: Self-pay | Admitting: Internal Medicine

## 2014-04-06 ENCOUNTER — Other Ambulatory Visit: Payer: Self-pay | Admitting: Internal Medicine

## 2014-04-13 ENCOUNTER — Ambulatory Visit (INDEPENDENT_AMBULATORY_CARE_PROVIDER_SITE_OTHER): Payer: Medicare Other | Admitting: Internal Medicine

## 2014-04-13 ENCOUNTER — Encounter: Payer: Self-pay | Admitting: Internal Medicine

## 2014-04-13 VITALS — BP 148/86 | HR 48 | Temp 98.4°F | Wt 225.0 lb

## 2014-04-13 DIAGNOSIS — F32A Depression, unspecified: Secondary | ICD-10-CM

## 2014-04-13 DIAGNOSIS — I10 Essential (primary) hypertension: Secondary | ICD-10-CM

## 2014-04-13 DIAGNOSIS — F329 Major depressive disorder, single episode, unspecified: Secondary | ICD-10-CM

## 2014-04-13 DIAGNOSIS — E538 Deficiency of other specified B group vitamins: Secondary | ICD-10-CM

## 2014-04-13 DIAGNOSIS — G4700 Insomnia, unspecified: Secondary | ICD-10-CM

## 2014-04-13 DIAGNOSIS — E119 Type 2 diabetes mellitus without complications: Secondary | ICD-10-CM

## 2014-04-13 DIAGNOSIS — Z23 Encounter for immunization: Secondary | ICD-10-CM

## 2014-04-13 MED ORDER — MORPHINE SULFATE ER 60 MG PO TBCR: 60.0000 mg | EXTENDED_RELEASE_TABLET | Freq: Two times a day (BID) | ORAL | Status: DC

## 2014-04-13 MED ORDER — OXYCODONE HCL 15 MG PO TABS: 15.0000 mg | ORAL_TABLET | Freq: Four times a day (QID) | ORAL | Status: DC | PRN

## 2014-04-13 MED ORDER — TRAZODONE HCL 50 MG PO TABS: 50.0000 mg | ORAL_TABLET | Freq: Every day | ORAL | Status: DC

## 2014-04-13 MED ORDER — CLOTRIMAZOLE-BETAMETHASONE 1-0.05 % EX CREA: 1.0000 "application " | TOPICAL_CREAM | Freq: Two times a day (BID) | CUTANEOUS | Status: DC

## 2014-04-13 NOTE — Assessment & Plan Note (Signed)
Continue with current prescription therapy as reflected on the Med list.  

## 2014-04-13 NOTE — Progress Notes (Signed)
Pre visit review using our clinic review tool, if applicable. No additional management support is needed unless otherwise documented below in the visit note. 

## 2014-04-13 NOTE — Progress Notes (Signed)
Subjective:    HPI  He had a THR R 03/14/13 -- Dr Alvan Dame  F/u lightheadedness when getting up - resolved. HR - mid-50s F/u low HR  The patient is here to follow up on chronic depression, anxiety, headaches and chronic moderate-severe LBP - worse and PMR symptoms. Pain is not controlled - worse.  He was not better on Prednisone and he stopped it. He is using a CPAP machine  F/u on low Na  Wt Readings from Last 3 Encounters:  04/13/14 225 lb (102.059 kg)  01/10/14 217 lb (98.431 kg)  11/27/13 221 lb (100.245 kg)   BP Readings from Last 3 Encounters:  04/13/14 148/86  01/10/14 140/80  11/27/13 160/78   BP 148/86 mmHg  Pulse 48  Temp(Src) 98.4 F (36.9 C) (Oral)  Wt 225 lb (102.059 kg)  SpO2 99%  Current Outpatient Prescriptions on File Prior to Visit  Medication Sig Dispense Refill  . acetaminophen (TYLENOL) 500 MG tablet Take 2 tablets (1,000 mg total) by mouth every 8 (eight) hours as needed for mild pain. 30 tablet 0  . amLODipine (NORVASC) 10 MG tablet TAKE 1 TABLET BY MOUTH EVERY MORNING. 30 tablet 5  . benazepril-hydrochlorthiazide (LOTENSIN HCT) 20-12.5 MG per tablet TAKE 2 TABLETS BY MOUTH DAILY. 60 tablet 5  . buPROPion (WELLBUTRIN SR) 150 MG 12 hr tablet TAKE 1 TABLET BY MOUTH 2 TIMES DAILY. 60 tablet 5  . Cholecalciferol (VITAMIN D3) 1000 UNIT tablet Take 1,000 Units by mouth daily.      . clonazePAM (KLONOPIN) 0.5 MG tablet Take 1 tablet (0.5 mg total) by mouth 2 (two) times daily as needed for anxiety. Take 1/2 to 1 tab by mouth twice daily as needed for anxiety 30 tablet 1  . clotrimazole-betamethasone (LOTRISONE) cream Apply 1 application topically 2 (two) times daily.    . Cyanocobalamin (VITAMIN B-12 CR) 1000 MCG TBCR Take 1,000 mcg by mouth daily.      Marland Kitchen docusate sodium 100 MG CAPS Take 100 mg by mouth 2 (two) times daily. 10 capsule 0  . ferrous sulfate 325 (65 FE) MG tablet Take 1 tablet (325 mg total) by mouth 3 (three) times daily after meals.  3  .  FLUoxetine (PROZAC) 40 MG capsule Take 1 capsule (40 mg total) by mouth daily. 90 capsule 2  . FLUoxetine (PROZAC) 40 MG capsule TAKE 1 CAPSULE BY MOUTH DAILY. 90 capsule 2  . gabapentin (NEURONTIN) 300 MG capsule TAKE 1 CAPSULE BY MOUTH 3 TIMES DAILY. 90 capsule 3  . morphine (MS CONTIN) 60 MG 12 hr tablet Take 1 tablet (60 mg total) by mouth every 12 (twelve) hours. Please fill on or after 03/15/14 60 tablet 0  . oxyCODONE (ROXICODONE) 15 MG immediate release tablet Take 1 tablet (15 mg total) by mouth every 6 (six) hours as needed. Please fill on or after 03/15/14 120 tablet 0  . polyethylene glycol (MIRALAX / GLYCOLAX) packet Take 17 g by mouth 2 (two) times daily. 14 each 0  . propranolol (INDERAL) 80 MG tablet TAKE 1 TABLET BY MOUTH 3 TIMES DAILY. 270 tablet 1  . testosterone cypionate (DEPOTESTOTERONE CYPIONATE) 100 MG/ML injection Inject 6 mLs (600 mg total) into the muscle every 14 (fourteen) days. For IM use only 10 mL 3  . tiZANidine (ZANAFLEX) 4 MG tablet TAKE 1 TABLET BY MOUTH EVERY 8 HOURS AS NEEDED FOR MUSCLE SPASMS. 60 tablet 0   No current facility-administered medications on file prior to visit.   Past Medical  History  Diagnosis Date  . Hypertension   . LBP (low back pain)   . Palpitations   . ED (erectile dysfunction)   . OSA on CPAP   . Hypogonadism male   . Vitamin D deficiency   . Hyperlipidemia   . Atrial fibrillation     1992  . Depression   . PTSD (post-traumatic stress disorder)   . MVP (mitral valve prolapse)   . Swallowing problem     HX OF PILLS GETTING "STUCK" IN THROAT AT TIMES  . Diabetes mellitus     type II  - PT STATES NOT ON ANY DIABETIC MEDICINES  . Osteoarthritis, hand     SEVERE LOWER BACK PAIN, AND RESTLESS LEG SYNDROME  . Osteoarthritis   . DDD (degenerative disc disease)   . DDD (degenerative disc disease)    Past Surgical History  Procedure Laterality Date  . Back surgery  1987  . Cervical laminectomy  2004    [name], rod was placed-  HAS ROM LIMITATIONS  . Appendectomy    . Cholecystectomy    . Colonoscopy    . Knee surgery      left knee/arthroscopic  . Total hip arthroplasty Right 03/14/2013    Procedure: RIGHT TOTAL HIP ARTHROPLASTY ANTERIOR APPROACH;  Surgeon: [name], MD;  Location: [facility];  Service: Orthopedics;  Laterality: Right;    reports that he quit smoking about 43 years ago. His smoking use included Cigarettes. He has a 12 pack-year smoking history. He does not have any smokeless tobacco history on file. He reports that he drinks about 0.5 oz of alcohol per week. He reports that he does not use illicit drugs. family history includes Allergies in his mother; Coronary artery disease in his other; Diabetes in his other; Esophageal cancer in his maternal aunt; Heart attack (age of onset: 26) in his father; Heart disease in his brother, father, and mother; Hypertension in his other; Other in his paternal grandmother; Rheum arthritis in his paternal grandmother; Stroke in his mother. There is no history of Colon cancer, Rectal cancer, or Stomach cancer. Allergies  Allergen Reactions  . Cymbalta [Duloxetine Hcl]     constipation  . Digoxin And Related     HR 41  . Duloxetine     REACTION: constipation  . Prednisolone     Edema, wt gain  . Temazepam      Review of Systems  Constitutional: Positive for fatigue and unexpected weight change. Negative for chills and appetite change.  HENT: Negative for congestion, nosebleeds, sneezing, sore throat and trouble swallowing.   Eyes: Negative for itching and visual disturbance.  Respiratory: Negative for cough and shortness of breath.   Cardiovascular: Negative for chest pain, palpitations and leg swelling.  Gastrointestinal: Negative for nausea, diarrhea, blood in stool and abdominal distention.  Genitourinary: Negative for frequency and hematuria.  Musculoskeletal: Positive for back pain and arthralgias (better). Negative for joint swelling, gait problem  and neck pain.  Skin: Negative for rash.  Neurological: Negative for dizziness, tremors, speech difficulty, weakness, light-headedness and headaches.  Psychiatric/Behavioral: Positive for sleep disturbance and dysphoric mood. Negative for suicidal ideas and agitation. The patient is not nervous/anxious.        Objective:   Physical Exam  Constitutional: He is oriented to person, place, and time. He appears well-developed.  obese  HENT:  Mouth/Throat: Oropharynx is clear and moist.  Eyes: Conjunctivae are normal. Pupils are equal, round, and reactive to light.  Neck: Normal range of  motion. No JVD present. No thyromegaly present.  Cardiovascular: Regular rhythm, normal heart sounds and intact distal pulses.  Exam reveals no gallop and no friction rub.   No murmur heard. brady  Pulmonary/Chest: Effort normal and breath sounds normal. No respiratory distress. He has no wheezes. He has no rales. He exhibits no tenderness.  Abdominal: Soft. Bowel sounds are normal. He exhibits no distension and no mass. There is no tenderness. There is no rebound and no guarding.  Musculoskeletal: Normal range of motion. He exhibits tenderness (all spine). He exhibits no edema.  L anterior prox arm sq lump  Lymphadenopathy:    He has no cervical adenopathy.  Neurological: He is alert and oriented to person, place, and time. He has normal reflexes. No cranial nerve deficit. He exhibits normal muscle tone. Coordination normal.  Skin: Skin is warm and dry. No rash noted.  Psychiatric: His behavior is normal. Judgment and thought content normal.  Not depressed, not suicidal or homicidal  LS is tender and stiff  Lab Results  Component Value Date   WBC 7.4 03/15/2013   HGB 13.3 03/15/2013   HCT 39.3 03/15/2013   PLT 157 03/15/2013   GLUCOSE 81 01/10/2014   CHOL 128 12/09/2009   TRIG 134.0 12/09/2009   HDL 20.40* 12/09/2009   LDLCALC 81 12/09/2009   ALT 26 01/10/2014   AST 24 01/10/2014   NA 133*  01/10/2014   K 4.1 01/10/2014   CL 100 01/10/2014   CREATININE 0.9 01/10/2014   BUN 12 01/10/2014   CO2 26 01/10/2014   TSH 0.70 11/01/2012   PSA 0.57 12/09/2009   INR 1.00 03/07/2013   HGBA1C 5.1 01/10/2014         Assessment & Plan:  Patient ID: Vernon Bishop, male   DOB: Jun 30, 1940, 74 y.o.   MRN: [id number]

## 2014-04-13 NOTE — Assessment & Plan Note (Signed)
Loose wt Labs 

## 2014-05-12 ENCOUNTER — Other Ambulatory Visit: Payer: Self-pay | Admitting: Internal Medicine

## 2014-05-28 ENCOUNTER — Other Ambulatory Visit: Payer: Self-pay | Admitting: *Deleted

## 2014-05-28 MED ORDER — CLONAZEPAM 0.5 MG PO TABS: 0.5000 mg | ORAL_TABLET | Freq: Two times a day (BID) | ORAL | Status: DC | PRN

## 2014-05-28 NOTE — Telephone Encounter (Signed)
Notified pharmacy spoke with Colletta Maryland gave md response...[name]

## 2014-05-28 NOTE — Telephone Encounter (Signed)
OK to fill this prescription with additional refills x1 Thank you!  

## 2014-05-28 NOTE — Telephone Encounter (Signed)
Rf req for Clonazepam 0.5 mg 1/2-1 bid prn. # 30. Last filled 03/27/14. Ok to Rf?

## 2014-07-13 ENCOUNTER — Encounter: Payer: Self-pay | Admitting: Internal Medicine

## 2014-07-13 ENCOUNTER — Ambulatory Visit (INDEPENDENT_AMBULATORY_CARE_PROVIDER_SITE_OTHER): Payer: Medicare Other | Admitting: Internal Medicine

## 2014-07-13 VITALS — BP 122/56 | HR 46 | Temp 98.0°F | Wt 222.2 lb

## 2014-07-13 DIAGNOSIS — M1990 Unspecified osteoarthritis, unspecified site: Secondary | ICD-10-CM | POA: Diagnosis not present

## 2014-07-13 DIAGNOSIS — E663 Overweight: Secondary | ICD-10-CM

## 2014-07-13 DIAGNOSIS — M5440 Lumbago with sciatica, unspecified side: Secondary | ICD-10-CM | POA: Diagnosis not present

## 2014-07-13 DIAGNOSIS — E119 Type 2 diabetes mellitus without complications: Secondary | ICD-10-CM

## 2014-07-13 MED ORDER — MORPHINE SULFATE ER 60 MG PO TBCR: 60.0000 mg | EXTENDED_RELEASE_TABLET | Freq: Two times a day (BID) | ORAL | Status: DC

## 2014-07-13 MED ORDER — OXYCODONE HCL 15 MG PO TABS: 15.0000 mg | ORAL_TABLET | Freq: Four times a day (QID) | ORAL | Status: DC | PRN

## 2014-07-13 NOTE — Progress Notes (Signed)
Subjective:    HPI  He had a THR R 03/14/13 -- Dr Vernon Bishop  F/u lightheadedness when getting up - resolved. HR - mid-50s F/u low HR  The patient is here to follow up on chronic depression, anxiety, headaches and chronic moderate-severe LBP - worse and PMR symptoms. Pain is not controlled - worse.  He was not better on Prednisone and he stopped it. He is using a CPAP machine  F/u on low Na  Wt Readings from Last 3 Encounters:  07/13/14 222 lb 4 oz (100.812 kg)  04/13/14 225 lb (102.059 kg)  01/10/14 217 lb (98.431 kg)   BP Readings from Last 3 Encounters:  07/13/14 122/56  04/13/14 148/86  01/10/14 140/80   BP 122/56 mmHg  Pulse 46  Temp(Src) 98 F (36.7 C)  Wt 222 lb 4 oz (100.812 kg)  SpO2 96%  Current Outpatient Prescriptions on File Prior to Visit  Medication Sig Dispense Refill  . acetaminophen (TYLENOL) 500 MG tablet Take 2 tablets (1,000 mg total) by mouth every 8 (eight) hours as needed for mild pain. 30 tablet 0  . amLODipine (NORVASC) 10 MG tablet TAKE 1 TABLET BY MOUTH EVERY MORNING. 30 tablet 5  . benazepril-hydrochlorthiazide (LOTENSIN HCT) 20-12.5 MG per tablet TAKE 2 TABLETS BY MOUTH DAILY. 60 tablet 5  . buPROPion (WELLBUTRIN SR) 150 MG 12 hr tablet TAKE 1 TABLET BY MOUTH 2 TIMES DAILY. 60 tablet 5  . Cholecalciferol (VITAMIN D3) 1000 UNIT tablet Take 1,000 Units by mouth daily.      . clotrimazole-betamethasone (LOTRISONE) cream Apply 1 application topically 2 (two) times daily. 60 g 1  . Cyanocobalamin (VITAMIN B-12 CR) 1000 MCG TBCR Take 1,000 mcg by mouth daily.      Marland Kitchen docusate sodium 100 MG CAPS Take 100 mg by mouth 2 (two) times daily. 10 capsule 0  . ferrous sulfate 325 (65 FE) MG tablet Take 1 tablet (325 mg total) by mouth 3 (three) times daily after meals.  3  . FLUoxetine (PROZAC) 40 MG capsule TAKE 1 CAPSULE BY MOUTH DAILY. 90 capsule 2  . gabapentin (NEURONTIN) 300 MG capsule TAKE 1 CAPSULE BY MOUTH 3 TIMES DAILY. 90 capsule 3  . morphine (MS  CONTIN) 60 MG 12 hr tablet Take 1 tablet (60 mg total) by mouth every 12 (twelve) hours. Please fill on or after 06/14/14 60 tablet 0  . oxyCODONE (ROXICODONE) 15 MG immediate release tablet Take 1 tablet (15 mg total) by mouth every 6 (six) hours as needed. Please fill on or after 06/14/14 120 tablet 0  . polyethylene glycol (MIRALAX / GLYCOLAX) packet Take 17 g by mouth 2 (two) times daily. 14 each 0  . propranolol (INDERAL) 80 MG tablet TAKE 1 TABLET BY MOUTH 3 TIMES DAILY. 270 tablet 1  . testosterone cypionate (DEPOTESTOTERONE CYPIONATE) 100 MG/ML injection Inject 6 mLs (600 mg total) into the muscle every 14 (fourteen) days. For IM use only 10 mL 3  . tiZANidine (ZANAFLEX) 4 MG tablet TAKE 1 TABLET BY MOUTH EVERY 8 HOURS AS NEEDED FOR MUSCLE SPASMS. 60 tablet 0  . traZODone (DESYREL) 50 MG tablet Take 1-2 tablets (50-100 mg total) by mouth at bedtime. 60 tablet 5  . clonazePAM (KLONOPIN) 0.5 MG tablet Take 1 tablet (0.5 mg total) by mouth 2 (two) times daily as needed for anxiety. Take 1/2 to 1 tab by mouth twice daily as needed for anxiety (Patient not taking: Reported on 07/13/2014) 30 tablet 1   No current  facility-administered medications on file prior to visit.   Past Medical History  Diagnosis Date  . Hypertension   . LBP (low back pain)   . Palpitations   . ED (erectile dysfunction)   . OSA on CPAP   . Hypogonadism male   . Vitamin D deficiency   . Hyperlipidemia   . Atrial fibrillation     1992  . Depression   . PTSD (post-traumatic stress disorder)   . MVP (mitral valve prolapse)   . Swallowing problem     HX OF PILLS GETTING "STUCK" IN THROAT AT TIMES  . Diabetes mellitus     type II  - PT STATES NOT ON ANY DIABETIC MEDICINES  . Osteoarthritis, hand     SEVERE LOWER BACK PAIN, AND RESTLESS LEG SYNDROME  . Osteoarthritis   . DDD (degenerative disc disease)   . DDD (degenerative disc disease)    Past Surgical History  Procedure Laterality Date  . Back surgery  1987   . Cervical laminectomy  2004    Vernon Bishop, rod was placed- HAS ROM LIMITATIONS  . Appendectomy    . Cholecystectomy    . Colonoscopy    . Knee surgery      left knee/arthroscopic  . Total hip arthroplasty Right 03/14/2013    Procedure: RIGHT TOTAL HIP ARTHROPLASTY ANTERIOR APPROACH;  Surgeon: Vernon Pole, MD;  Location: [facility];  Service: Orthopedics;  Laterality: Right;    reports that he quit smoking about 43 years ago. His smoking use included Cigarettes. He has a 12 pack-year smoking history. He does not have any smokeless tobacco history on file. He reports that he drinks about 0.5 oz of alcohol per week. He reports that he does not use illicit drugs. family history includes Allergies in his mother; Coronary artery disease in his other; Diabetes in his other; Esophageal cancer in his maternal aunt; Heart attack (age of onset: 61) in his father; Heart disease in his brother, father, and mother; Hypertension in his other; Other in his paternal grandmother; Rheum arthritis in his paternal grandmother; Stroke in his mother. There is no history of Colon cancer, Rectal cancer, or Stomach cancer. Allergies  Allergen Reactions  . Cymbalta [Duloxetine Hcl]     constipation  . Digoxin And Related     HR 41  . Duloxetine     REACTION: constipation  . Prednisolone     Edema, wt gain  . Temazepam      Review of Systems  Constitutional: Positive for fatigue and unexpected weight change. Negative for chills and appetite change.  HENT: Negative for congestion, nosebleeds, sneezing, sore throat and trouble swallowing.   Eyes: Negative for itching and visual disturbance.  Respiratory: Negative for cough and shortness of breath.   Cardiovascular: Negative for chest pain, palpitations and leg swelling.  Gastrointestinal: Negative for nausea, diarrhea, blood in stool and abdominal distention.  Genitourinary: Negative for frequency and hematuria.  Musculoskeletal: Positive for back pain and  arthralgias (better). Negative for joint swelling, gait problem and neck pain.  Skin: Negative for rash.  Neurological: Negative for dizziness, tremors, speech difficulty, weakness, light-headedness and headaches.  Psychiatric/Behavioral: Positive for sleep disturbance and dysphoric mood. Negative for suicidal ideas and agitation. The patient is not nervous/anxious.        Objective:   Physical Exam  Constitutional: He is oriented to person, place, and time. He appears well-developed.  obese  HENT:  Mouth/Throat: Oropharynx is clear and moist.  Eyes: Conjunctivae are normal. Pupils are  equal, round, and reactive to light.  Neck: Normal range of motion. No JVD present. No thyromegaly present.  Cardiovascular: Regular rhythm, normal heart sounds and intact distal pulses.  Exam reveals no gallop and no friction rub.   No murmur heard. brady  Pulmonary/Chest: Effort normal and breath sounds normal. No respiratory distress. He has no wheezes. He has no rales. He exhibits no tenderness.  Abdominal: Soft. Bowel sounds are normal. He exhibits no distension and no mass. There is no tenderness. There is no rebound and no guarding.  Musculoskeletal: Normal range of motion. He exhibits tenderness (all spine). He exhibits no edema.  L anterior prox arm sq lump  Lymphadenopathy:    He has no cervical adenopathy.  Neurological: He is alert and oriented to person, place, and time. He has normal reflexes. No cranial nerve deficit. He exhibits normal muscle tone. Coordination normal.  Skin: Skin is warm and dry. No rash noted.  Psychiatric: His behavior is normal. Judgment and thought content normal.  Not depressed, not suicidal or homicidal  LS is tender and stiff  Lab Results  Component Value Date   WBC 7.4 03/15/2013   HGB 13.3 03/15/2013   HCT 39.3 03/15/2013   PLT 157 03/15/2013   GLUCOSE 81 01/10/2014   CHOL 128 12/09/2009   TRIG 134.0 12/09/2009   HDL 20.40* 12/09/2009   LDLCALC 81  12/09/2009   ALT 26 01/10/2014   AST 24 01/10/2014   NA 133* 01/10/2014   K 4.1 01/10/2014   CL 100 01/10/2014   CREATININE 0.9 01/10/2014   BUN 12 01/10/2014   CO2 26 01/10/2014   TSH 0.70 11/01/2012   PSA 0.57 12/09/2009   INR 1.00 03/07/2013   HGBA1C 5.1 01/10/2014         Assessment & Plan:  Patient ID: Vernon Bishop, male   DOB: [birth year], 74 y.o.   MRN: [id number]

## 2014-07-13 NOTE — Assessment & Plan Note (Signed)
Chronic, diet controlled. 

## 2014-07-13 NOTE — Assessment & Plan Note (Signed)
Wt Readings from Last 3 Encounters:  07/13/14 222 lb 4 oz (100.812 kg)  04/13/14 225 lb (102.059 kg)  01/10/14 217 lb (98.431 kg)

## 2014-07-13 NOTE — Assessment & Plan Note (Signed)
He had a THR R 03/14/13 -- Dr Alvan Dame Pain meds

## 2014-07-13 NOTE — Progress Notes (Signed)
Pre visit review using our clinic review tool, if applicable. No additional management support is needed unless otherwise documented below in the visit note. 

## 2014-07-13 NOTE — Assessment & Plan Note (Signed)
Oxycodone, MS-contin, Tizanidine Chronic Potential benefits of a long term opioids use as well as potential risks (i.e. addiction risk, apnea etc) and complications (i.e. Somnolence, constipation and others) were explained to the patient and were aknowledged.   

## 2014-08-27 ENCOUNTER — Other Ambulatory Visit: Payer: Self-pay | Admitting: Internal Medicine

## 2014-09-03 ENCOUNTER — Other Ambulatory Visit: Payer: Self-pay | Admitting: Internal Medicine

## 2014-09-06 ENCOUNTER — Telehealth: Payer: Self-pay | Admitting: Internal Medicine

## 2014-09-06 NOTE — Telephone Encounter (Signed)
Appeal letter and Testosterone labs faxed to appeal dept at number below.

## 2014-09-06 NOTE — Telephone Encounter (Signed)
[name] from [facility] called PA is approved. Approval letter will be faxed to pur office. Pt's pharmacy informed.

## 2014-09-06 NOTE — Telephone Encounter (Signed)
I found a Testosterone from 6 years ago. Will call appeal # below and provide lab values. Faxed labs to  [phone]

## 2014-09-06 NOTE — Telephone Encounter (Signed)
[employer] called and they receive the expedited appeal for patient. There is a 72 hour turnaround time and any other information to support the appeal can be faxed to [phone]

## 2014-09-06 NOTE — Telephone Encounter (Signed)
I received a call from [place] at [place]. She states appeal letter and labs were received. She is forwarding them for review and we will receive a call once a determination has been made.

## 2014-09-06 NOTE — Telephone Encounter (Signed)
[employer] called and wanted to let you know that testosterone cypionate (DEPOTESTOTERONE CYPIONATE) 200 MG/ML injection [[id number] has been denied. I asked if there was an alternative and it appears they are needing the testosterone labs. If you need to appeal call [phone].

## 2014-09-12 ENCOUNTER — Telehealth: Payer: Self-pay

## 2014-09-12 NOTE — Telephone Encounter (Signed)
PA for testosterone started via fax.

## 2014-09-24 ENCOUNTER — Other Ambulatory Visit: Payer: Self-pay | Admitting: Internal Medicine

## 2014-09-24 ENCOUNTER — Other Ambulatory Visit: Payer: Self-pay

## 2014-10-15 ENCOUNTER — Encounter: Payer: Self-pay | Admitting: Internal Medicine

## 2014-10-15 ENCOUNTER — Ambulatory Visit (INDEPENDENT_AMBULATORY_CARE_PROVIDER_SITE_OTHER): Payer: Medicare Other | Admitting: Internal Medicine

## 2014-10-15 VITALS — BP 148/80 | HR 53 | Ht 72.0 in | Wt 223.0 lb

## 2014-10-15 DIAGNOSIS — M5440 Lumbago with sciatica, unspecified side: Secondary | ICD-10-CM | POA: Diagnosis not present

## 2014-10-15 MED ORDER — OXYCODONE HCL 15 MG PO TABS: 15.0000 mg | ORAL_TABLET | Freq: Four times a day (QID) | ORAL | Status: DC | PRN

## 2014-10-15 MED ORDER — MORPHINE SULFATE ER 30 MG PO TBCR: 30.0000 mg | EXTENDED_RELEASE_TABLET | Freq: Two times a day (BID) | ORAL | Status: DC

## 2014-10-15 NOTE — Progress Notes (Signed)
Pre visit review using our clinic review tool, if applicable. No additional management support is needed unless otherwise documented below in the visit note. 

## 2014-10-15 NOTE — Progress Notes (Signed)
Subjective:  Patient ID: Vernon Bishop, male    DOB: 09-May-1940  Age: 74 y.o. MRN: [id number]  CC: No chief complaint on file.   HPI Vernon Bishop presents for chronic LBP, knee OA, depression. Pt would like to reduce morphine (MS contin) to 30 mg bid  Outpatient Prescriptions Prior to Visit  Medication Sig Dispense Refill  . acetaminophen (TYLENOL) 500 MG tablet Take 2 tablets (1,000 mg total) by mouth every 8 (eight) hours as needed for mild pain. 30 tablet 0  . amLODipine (NORVASC) 10 MG tablet TAKE 1 TABLET BY MOUTH EVERY MORNING. 30 tablet 5  . benazepril-hydrochlorthiazide (LOTENSIN HCT) 20-12.5 MG per tablet TAKE 2 TABLETS BY MOUTH DAILY. 60 tablet 5  . buPROPion (WELLBUTRIN SR) 150 MG 12 hr tablet TAKE 1 TABLET BY MOUTH 2 TIMES DAILY. 60 tablet 5  . Cholecalciferol (VITAMIN D3) 1000 UNIT tablet Take 1,000 Units by mouth daily.      . clonazePAM (KLONOPIN) 0.5 MG tablet Take 1 tablet (0.5 mg total) by mouth 2 (two) times daily as needed for anxiety. Take 1/2 to 1 tab by mouth twice daily as needed for anxiety 30 tablet 1  . clotrimazole-betamethasone (LOTRISONE) cream Apply 1 application topically 2 (two) times daily. 60 g 1  . Cyanocobalamin (VITAMIN B-12 CR) 1000 MCG TBCR Take 1,000 mcg by mouth daily.      Marland Kitchen docusate sodium 100 MG CAPS Take 100 mg by mouth 2 (two) times daily. 10 capsule 0  . ferrous sulfate 325 (65 FE) MG tablet Take 1 tablet (325 mg total) by mouth 3 (three) times daily after meals.  3  . FLUoxetine (PROZAC) 40 MG capsule TAKE 1 CAPSULE BY MOUTH DAILY. 90 capsule 2  . gabapentin (NEURONTIN) 300 MG capsule TAKE 1 CAPSULE BY MOUTH 3 TIMES DAILY. 90 capsule 3  . polyethylene glycol (MIRALAX / GLYCOLAX) packet Take 17 g by mouth 2 (two) times daily. 14 each 0  . propranolol (INDERAL) 80 MG tablet TAKE 1 TABLET BY MOUTH 3 TIMES DAILY. 270 tablet 1  . testosterone cypionate (DEPOTESTOTERONE CYPIONATE) 100 MG/ML injection Inject 6 mLs (600 mg total) into the muscle  every 14 (fourteen) days. For IM use only 10 mL 3  . testosterone cypionate (DEPOTESTOTERONE CYPIONATE) 200 MG/ML injection INJECT 3 MILLILITERS (600 MG TOTAL) INTO THE MUSCLE EVERY 14 DAYS. 10 mL 3  . tiZANidine (ZANAFLEX) 4 MG tablet TAKE 1 TABLET BY MOUTH EVERY 8 HOURS AS NEEDED FOR MUSCLE SPASMS. 60 tablet 0  . traZODone (DESYREL) 50 MG tablet Take 1-2 tablets (50-100 mg total) by mouth at bedtime. 60 tablet 5  . morphine (MS CONTIN) 60 MG 12 hr tablet Take 1 tablet (60 mg total) by mouth every 12 (twelve) hours. Please fill on or after 09/14/14 60 tablet 0  . oxyCODONE (ROXICODONE) 15 MG immediate release tablet Take 1 tablet (15 mg total) by mouth every 6 (six) hours as needed. Please fill on or after 09/14/14 120 tablet 0   No facility-administered medications prior to visit.    ROS Review of Systems  Constitutional: Positive for fatigue. Negative for appetite change and unexpected weight change.  HENT: Negative for congestion, nosebleeds, sneezing, sore throat and trouble swallowing.   Eyes: Negative for itching and visual disturbance.  Respiratory: Negative for cough.   Cardiovascular: Negative for chest pain, palpitations and leg swelling.  Gastrointestinal: Negative for nausea, diarrhea, blood in stool and abdominal distention.  Genitourinary: Negative for frequency and hematuria.  Musculoskeletal:  Positive for back pain, arthralgias and gait problem. Negative for joint swelling and neck pain.  Skin: Negative for rash.  Neurological: Positive for dizziness. Negative for tremors, speech difficulty and weakness.  Psychiatric/Behavioral: Positive for dysphoric mood. Negative for suicidal ideas, sleep disturbance and agitation. The patient is not nervous/anxious.     Objective:  BP 148/80 mmHg  Pulse 53  Ht 6' (1.829 m)  Wt 223 lb (101.152 kg)  BMI 30.24 kg/m2  SpO2 95%  BP Readings from Last 3 Encounters:  10/15/14 148/80  07/13/14 122/56  04/13/14 148/86    Wt Readings  from Last 3 Encounters:  10/15/14 223 lb (101.152 kg)  07/13/14 222 lb 4 oz (100.812 kg)  04/13/14 225 lb (102.059 kg)    Physical Exam  Constitutional: He is oriented to person, place, and time. He appears well-developed. No distress.  Obese NAD  HENT:  Mouth/Throat: Oropharynx is clear and moist.  Eyes: Conjunctivae are normal. Pupils are equal, round, and reactive to light.  Neck: Normal range of motion. No JVD present. No thyromegaly present.  Cardiovascular: Normal rate, regular rhythm, normal heart sounds and intact distal pulses.  Exam reveals no gallop and no friction rub.   No murmur heard. Pulmonary/Chest: Effort normal and breath sounds normal. No respiratory distress. He has no wheezes. He has no rales. He exhibits no tenderness.  Abdominal: Soft. Bowel sounds are normal. He exhibits no distension and no mass. There is no tenderness. There is no rebound and no guarding.  Musculoskeletal: Normal range of motion. He exhibits tenderness. He exhibits no edema.  Lymphadenopathy:    He has no cervical adenopathy.  Neurological: He is alert and oriented to person, place, and time. He has normal reflexes. No cranial nerve deficit. He exhibits normal muscle tone. He displays a negative Romberg sign. Coordination and gait normal.  Skin: Skin is warm and dry. No rash noted.  Psychiatric: He has a normal mood and affect. His behavior is normal. Judgment and thought content normal.  B knees w/OA deformities and effusion LS is tender   Lab Results  Component Value Date   WBC 7.4 03/15/2013   HGB 13.3 03/15/2013   HCT 39.3 03/15/2013   PLT 157 03/15/2013   GLUCOSE 81 01/10/2014   CHOL 128 12/09/2009   TRIG 134.0 12/09/2009   HDL 20.40* 12/09/2009   LDLCALC 81 12/09/2009   ALT 26 01/10/2014   AST 24 01/10/2014   NA 133* 01/10/2014   K 4.1 01/10/2014   CL 100 01/10/2014   CREATININE 0.9 01/10/2014   BUN 12 01/10/2014   CO2 26 01/10/2014   TSH 0.70 11/01/2012   PSA 0.57  12/09/2009   INR 1.00 03/07/2013   HGBA1C 5.1 01/10/2014    Dg Chest 2 View  03/07/2013   CLINICAL DATA:  Preoperative radiograph. Right total hip arthroplasty.  EXAM: CHEST  2 VIEW  COMPARISON:  CT chest 01/24/2004.  FINDINGS: Old granulomatous disease is present. The cardiopericardial silhouette is borderline for projection. Cervical ACDF plate. No airspace disease or effusion. Thoracic spondylosis. Surgical clips in the upper abdomen are likely for cholecystectomy.  IMPRESSION: No active cardiopulmonary disease.  Old granulomatous disease.   Electronically Signed   By: Dereck Ligas M.D.   On: 03/07/2013 16:23    Assessment & Plan:   Diagnoses and all orders for this visit:  Midline low back pain with sciatica, sciatica laterality unspecified  Other orders -     Discontinue: morphine (MS CONTIN) 30 MG 12  hr tablet; Take 1 tablet (30 mg total) by mouth every 12 (twelve) hours. -     Discontinue: oxyCODONE (ROXICODONE) 15 MG immediate release tablet; Take 1 tablet (15 mg total) by mouth every 6 (six) hours as needed. Please fill on or after 10/15/14 -     Discontinue: oxyCODONE (ROXICODONE) 15 MG immediate release tablet; Take 1 tablet (15 mg total) by mouth every 6 (six) hours as needed. Please fill on or after 11/15/14 -     Discontinue: morphine (MS CONTIN) 30 MG 12 hr tablet; Take 1 tablet (30 mg total) by mouth every 12 (twelve) hours. -     oxyCODONE (ROXICODONE) 15 MG immediate release tablet; Take 1 tablet (15 mg total) by mouth every 6 (six) hours as needed. Please fill on or after 12/16/14 -     morphine (MS CONTIN) 30 MG 12 hr tablet; Take 1 tablet (30 mg total) by mouth every 12 (twelve) hours.  I have discontinued Mr. Kinzler oxyCODONE and oxyCODONE. I have also changed his oxyCODONE. Additionally, I am having him maintain his Vitamin B-12 CR, cholecalciferol, DSS, ferrous sulfate, polyethylene glycol, acetaminophen, testosterone cypionate, propranolol, gabapentin,  benazepril-hydrochlorthiazide, amLODipine, FLUoxetine, clotrimazole-betamethasone, traZODone, clonazePAM, testosterone cypionate, tiZANidine, buPROPion, and morphine.  Meds ordered this encounter  Medications  . DISCONTD: morphine (MS CONTIN) 30 MG 12 hr tablet    Sig: Take 1 tablet (30 mg total) by mouth every 12 (twelve) hours.    Dispense:  60 tablet    Refill:  0  . DISCONTD: oxyCODONE (ROXICODONE) 15 MG immediate release tablet    Sig: Take 1 tablet (15 mg total) by mouth every 6 (six) hours as needed. Please fill on or after 10/15/14    Dispense:  120 tablet    Refill:  0  . DISCONTD: oxyCODONE (ROXICODONE) 15 MG immediate release tablet    Sig: Take 1 tablet (15 mg total) by mouth every 6 (six) hours as needed. Please fill on or after 11/15/14    Dispense:  120 tablet    Refill:  0  . DISCONTD: morphine (MS CONTIN) 30 MG 12 hr tablet    Sig: Take 1 tablet (30 mg total) by mouth every 12 (twelve) hours.    Dispense:  60 tablet    Refill:  0    Please fill on or after 11/15/14  . oxyCODONE (ROXICODONE) 15 MG immediate release tablet    Sig: Take 1 tablet (15 mg total) by mouth every 6 (six) hours as needed. Please fill on or after 12/16/14    Dispense:  120 tablet    Refill:  0  . morphine (MS CONTIN) 30 MG 12 hr tablet    Sig: Take 1 tablet (30 mg total) by mouth every 12 (twelve) hours.    Dispense:  60 tablet    Refill:  0    Please fill on or after 12/16/14     Follow-up: Return in about 3 months (around 01/15/2015) for Wellness Exam.  Walker Kehr, MD

## 2014-10-15 NOTE — Assessment & Plan Note (Addendum)
Pt would like to reduce morphine (MS contin) to 30 mg bid - ok Oxycodone,MScontin, Tizanidine Potential benefits of a long term opioids use as well as potential risks (i.e. addiction risk, apnea etc) and complications (i.e. Somnolence, constipation and others) were explained to the patient and were aknowledged.

## 2014-10-29 ENCOUNTER — Other Ambulatory Visit: Payer: Self-pay | Admitting: Internal Medicine

## 2014-11-16 ENCOUNTER — Telehealth: Payer: Self-pay | Admitting: *Deleted

## 2014-11-16 NOTE — Telephone Encounter (Signed)
Left msg on triage requesting refill on his oxycodone.../lmb 

## 2014-11-17 NOTE — Telephone Encounter (Signed)
Ok if time Thx 

## 2014-11-19 MED ORDER — OXYCODONE HCL 15 MG PO TABS: 15.0000 mg | ORAL_TABLET | Freq: Four times a day (QID) | ORAL | Status: DC | PRN

## 2014-11-19 NOTE — Telephone Encounter (Signed)
Notified pt rx ready for pick-up.../lmb 

## 2014-11-28 ENCOUNTER — Other Ambulatory Visit: Payer: Self-pay | Admitting: Pulmonary Disease

## 2014-11-28 ENCOUNTER — Ambulatory Visit: Payer: Medicare Other | Admitting: Pulmonary Disease

## 2014-11-28 DIAGNOSIS — Z9989 Dependence on other enabling machines and devices: Principal | ICD-10-CM

## 2014-11-28 DIAGNOSIS — G4733 Obstructive sleep apnea (adult) (pediatric): Secondary | ICD-10-CM

## 2014-11-29 ENCOUNTER — Other Ambulatory Visit: Payer: Self-pay | Admitting: Internal Medicine

## 2014-12-05 ENCOUNTER — Encounter: Payer: Self-pay | Admitting: Pulmonary Disease

## 2014-12-05 ENCOUNTER — Ambulatory Visit (INDEPENDENT_AMBULATORY_CARE_PROVIDER_SITE_OTHER): Payer: Medicare Other | Admitting: Pulmonary Disease

## 2014-12-05 VITALS — BP 140/78 | HR 54 | Ht 72.0 in | Wt 228.8 lb

## 2014-12-05 DIAGNOSIS — Z23 Encounter for immunization: Secondary | ICD-10-CM

## 2014-12-05 DIAGNOSIS — G4733 Obstructive sleep apnea (adult) (pediatric): Secondary | ICD-10-CM

## 2014-12-05 DIAGNOSIS — Z9989 Dependence on other enabling machines and devices: Principal | ICD-10-CM

## 2014-12-05 NOTE — Patient Instructions (Signed)
CPAP supplies will be renewed x 1 year Download will be checked on 12 cm

## 2014-12-05 NOTE — Progress Notes (Signed)
   Subjective:    Patient ID: Vernon Bishop, male    DOB: 1940/10/11, 74 y.o.   MRN: [id number]  HPI  Annual FU of OSA   NPSG 2002:  AHI 80/hr.  CPAP 12cm optimal  Auto 2013:  Optimal pressure 12cm.  Pt has chosen to stay on auto setting. On morphine for chronic back pain  Chief Complaint  Patient presents with  . Sleep Apnea    Wears CPAP every night; renew supplies; coughing up phlegm in the mornings after he wakes up.  flu shot   Nasal mask , occ dryness, pr ok , Was on auto settings  Download 07/2014 - AHi 14/h, centrals 6/h, good usage Changed to 12 cm, feels refreshed, no naps Wt unchanged   Past Medical History  Diagnosis Date  . Hypertension   . LBP (low back pain)   . Palpitations   . ED (erectile dysfunction)   . OSA on CPAP   . Hypogonadism male   . Vitamin D deficiency   . Hyperlipidemia   . Atrial fibrillation     1992  . Depression   . PTSD (post-traumatic stress disorder)   . MVP (mitral valve prolapse)   . Swallowing problem     HX OF PILLS GETTING "STUCK" IN THROAT AT TIMES  . Diabetes mellitus     type II  - PT STATES NOT ON ANY DIABETIC MEDICINES  . Osteoarthritis, hand     SEVERE LOWER BACK PAIN, AND RESTLESS LEG SYNDROME  . Osteoarthritis   . DDD (degenerative disc disease)   . DDD (degenerative disc disease)       Review of Systems neg for any significant sore throat, dysphagia, itching, sneezing, nasal congestion or excess/ purulent secretions, fever, chills, sweats, unintended wt loss, pleuritic or exertional cp, hempoptysis, orthopnea pnd or change in chronic leg swelling. Also denies presyncope, palpitations, heartburn, abdominal pain, nausea, vomiting, diarrhea or change in bowel or urinary habits, dysuria,hematuria, rash, arthralgias, visual complaints, headache, numbness weakness or ataxia.     Objective:   Physical Exam  Gen. Pleasant, obese, in no distress ENT - no lesions, no post nasal drip Neck: No JVD, no thyromegaly, no  carotid bruits Lungs: no use of accessory muscles, no dullness to percussion, decreased without rales or rhonchi  Cardiovascular: Rhythm regular, heart sounds  normal, no murmurs or gallops, no peripheral edema Musculoskeletal: No deformities, no cyanosis or clubbing , no tremors       Assessment & Plan:

## 2014-12-05 NOTE — Assessment & Plan Note (Signed)
CPAP supplies will be renewed x 1 year Download will be checked on 12 cm Few centrals related to morphine  Weight loss encouraged, compliance with goal of at least 6 hrs every night is the expectation. Advised against medications with sedative side effects Cautioned against driving when sleepy - understanding that sleepiness will vary on a day to day basis

## 2014-12-06 ENCOUNTER — Other Ambulatory Visit: Payer: Self-pay | Admitting: Internal Medicine

## 2014-12-19 ENCOUNTER — Encounter: Payer: Self-pay | Admitting: Pulmonary Disease

## 2015-01-02 ENCOUNTER — Ambulatory Visit (INDEPENDENT_AMBULATORY_CARE_PROVIDER_SITE_OTHER): Payer: Medicare Other

## 2015-01-02 VITALS — Ht 72.0 in | Wt 230.2 lb

## 2015-01-02 DIAGNOSIS — Z0000 Encounter for general adult medical examination without abnormal findings: Secondary | ICD-10-CM

## 2015-01-02 NOTE — Patient Instructions (Addendum)
[name] , Thank you for taking time to come for your Medicare Wellness Visit. I appreciate your ongoing commitment to your health goals. Please review the following plan we discussed and let me know if I can assist you in the future.   Will discuss pain management and options as  spinal cord implant or pain pump or other if indicated   Will discuss mood and discussed counseling with Dr.and referral if needed;   EXERCISE as tolerated ; check out pool or other   Discussed eating a small lunch   These are the goals we discussed: Goals    . Exercise 150 minutes per week (moderate activity)     Thinking about joining the [facility] and considering pool exercise; Suggested stretching exercise        This is a list of the screening recommended for you and due dates:  Health Maintenance  Topic Date Due  . Eye exam for diabetics  11/29/1950  . Urine Protein Check  11/29/1950  . Complete foot exam   08/24/2013  . Hemoglobin A1C  07/12/2014  . Flu Shot  10/29/2015  . Tetanus Vaccine  03/30/2018  . Colon Cancer Screening  12/16/2021  . Shingles Vaccine  Completed  . Pneumonia vaccines  Completed     Health Maintenance, Male A healthy lifestyle and preventative care can promote health and wellness.  Maintain regular health, dental, and eye exams.  Eat a healthy diet. Foods like vegetables, fruits, whole grains, low-fat dairy products, and lean protein foods contain the nutrients you need and are low in calories. Decrease your intake of foods high in solid fats, added sugars, and salt. Get information about a proper diet from your health care provider, if necessary.  Regular physical exercise is one of the most important things you can do for your health. Most adults should get at least 150 minutes of moderate-intensity exercise (any activity that increases your heart rate and causes you to sweat) each week. In addition, most adults need muscle-strengthening exercises on 2 or more days a  week.   Maintain a healthy weight. The body mass index (BMI) is a screening tool to identify possible weight problems. It provides an estimate of body fat based on height and weight. Your health care provider can find your BMI and can help you achieve or maintain a healthy weight. For males 20 years and older:  A BMI below 18.5 is considered underweight.  A BMI of 18.5 to 24.9 is normal.  A BMI of 25 to 29.9 is considered overweight.  A BMI of 30 and above is considered obese.  Maintain normal blood lipids and cholesterol by exercising and minimizing your intake of saturated fat. Eat a balanced diet with plenty of fruits and vegetables. Blood tests for lipids and cholesterol should begin at age 70 and be repeated every 5 years. If your lipid or cholesterol levels are high, you are over age 6, or you are at high risk for heart disease, you may need your cholesterol levels checked more frequently.Ongoing high lipid and cholesterol levels should be treated with medicines if diet and exercise are not working.  If you smoke, find out from your health care provider how to quit. If you do not use tobacco, do not start.  Lung cancer screening is recommended for adults aged 67-80 years who are at high risk for developing lung cancer because of a history of smoking. A yearly low-dose CT scan of the lungs is recommended for people who  have at least a 30-pack-year history of smoking and are current smokers or have quit within the past 15 years. A pack year of smoking is smoking an average of 1 pack of cigarettes a day for 1 year (for example, a 30-pack-year history of smoking could mean smoking 1 pack a day for 30 years or 2 packs a day for 15 years). Yearly screening should continue until the smoker has stopped smoking for at least 15 years. Yearly screening should be stopped for people who develop a health problem that would prevent them from having lung cancer treatment.  If you choose to drink alcohol,  do not have more than 2 drinks per day. One drink is considered to be 12 oz (360 mL) of beer, 5 oz (150 mL) of wine, or 1.5 oz (45 mL) of liquor.  Avoid the use of street drugs. Do not share needles with anyone. Ask for help if you need support or instructions about stopping the use of drugs.  High blood pressure causes heart disease and increases the risk of stroke. High blood pressure is more likely to develop in:  People who have blood pressure in the end of the normal range (100-139/85-89 mm Hg).  People who are overweight or obese.  People who are African American.  If you are 63-26 years of age, have your blood pressure checked every 3-5 years. If you are 33 years of age or older, have your blood pressure checked every year. You should have your blood pressure measured twice--once when you are at a hospital or clinic, and once when you are not at a hospital or clinic. Record the average of the two measurements. To check your blood pressure when you are not at a hospital or clinic, you can use:  An automated blood pressure machine at a pharmacy.  A home blood pressure monitor.  If you are 43-66 years old, ask your health care provider if you should take aspirin to prevent heart disease.  Diabetes screening involves taking a blood sample to check your fasting blood sugar level. This should be done once every 3 years after age 76 if you are at a normal weight and without risk factors for diabetes. Testing should be considered at a younger age or be carried out more frequently if you are overweight and have at least 1 risk factor for diabetes.  Colorectal cancer can be detected and often prevented. Most routine colorectal cancer screening begins at the age of 66 and continues through age 41. However, your health care provider may recommend screening at an earlier age if you have risk factors for colon cancer. On a yearly basis, your health care provider may provide home test kits to check for  hidden blood in the stool. A small camera at the end of a tube may be used to directly examine the colon (sigmoidoscopy or colonoscopy) to detect the earliest forms of colorectal cancer. Talk to your health care provider about this at age 31 when routine screening begins. A direct exam of the colon should be repeated every 5-10 years through age 10, unless early forms of precancerous polyps or small growths are found.  People who are at an increased risk for hepatitis B should be screened for this virus. You are considered at high risk for hepatitis B if:  You were born in a country where hepatitis B occurs often. Talk with your health care provider about which countries are considered high risk.  Your parents were born in  a high-risk country and you have not received a shot to protect against hepatitis B (hepatitis B vaccine).  You have HIV or AIDS.  You use needles to inject street drugs.  You live with, or have sex with, someone who has hepatitis B.  You are a man who has sex with other men (MSM).  You get hemodialysis treatment.  You take certain medicines for conditions like cancer, organ transplantation, and autoimmune conditions.  Hepatitis C blood testing is recommended for all people born from [birth year] through 1965 and any individual with known risk factors for hepatitis C.  Healthy men should no longer receive prostate-specific antigen (PSA) blood tests as part of routine cancer screening. Talk to your health care provider about prostate cancer screening.  Testicular cancer screening is not recommended for adolescents or adult males who have no symptoms. Screening includes self-exam, a health care provider exam, and other screening tests. Consult with your health care provider about any symptoms you have or any concerns you have about testicular cancer.  Practice safe sex. Use condoms and avoid high-risk sexual practices to reduce the spread of sexually transmitted infections  (STIs).  You should be screened for STIs, including gonorrhea and chlamydia if:  You are sexually active and are younger than 24 years.  You are older than 24 years, and your health care provider tells you that you are at risk for this type of infection.  Your sexual activity has changed since you were last screened, and you are at an increased risk for chlamydia or gonorrhea. Ask your health care provider if you are at risk.  If you are at risk of being infected with HIV, it is recommended that you take a prescription medicine daily to prevent HIV infection. This is called pre-exposure prophylaxis (PrEP). You are considered at risk if:  You are a man who has sex with other men (MSM).  You are a heterosexual man who is sexually active with multiple partners.  You take drugs by injection.  You are sexually active with a partner who has HIV.  Talk with your health care provider about whether you are at high risk of being infected with HIV. If you choose to begin PrEP, you should first be tested for HIV. You should then be tested every 3 months for as long as you are taking PrEP.  Use sunscreen. Apply sunscreen liberally and repeatedly throughout the day. You should seek shade when your shadow is shorter than you. Protect yourself by wearing long sleeves, pants, a wide-brimmed hat, and sunglasses year round whenever you are outdoors.  Tell your health care provider of new moles or changes in moles, especially if there is a change in shape or color. Also, tell your health care provider if a mole is larger than the size of a pencil eraser.  A one-time screening for abdominal aortic aneurysm (AAA) and surgical repair of large AAAs by ultrasound is recommended for men aged 3-75 years who are current or former smokers.  Stay current with your vaccines (immunizations).   This information is not intended to replace advice given to you by your health care provider. Make sure you discuss any  questions you have with your health care provider.   Document Released: 09/12/2007 Document Revised: 04/06/2014 Document Reviewed: 08/11/2010 Elsevier Interactive Patient Education [employer].

## 2015-01-02 NOTE — Progress Notes (Addendum)
Subjective:   Vernon Bishop is a 74 y.o. male who presents for Medicare Annual/Subsequent preventive examination.  Review of Systems:  HRA assessment completed during visit; Vernon Bishop The Patient was informed that this wellness visit is to identify risk and educate on how to reduce risk for increase disease through lifestyle changes.   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 Roxy Bishop did his right hip replacement and it was great; Cannot state end point to knee but will go to Dr. Alvan Bishop if he decides for replacement Sometimes he has a good day; 1 -10; worse pain is 7 some of time;  Roxicodone 15mg  q 6 h for knee and back with MS 30mg  po bid Back injury in 80's L 4-L5; DJD since  Can only walk from house to the mailbox / Dr. recommended PT but he is not sure it will help   SAFETY/ one level home Safety reviewed for the home; including removal of clutter; clear paths through the home,  railing as needed; bathroom safe uses shower; son fixed shower with grab bar and seat; community safety; smoke detectors in the home and firearms safety as well as sun protection;  Driving accidents-  none and seatbelt yes Sun protection; no but wears a hat Stressors; none except pain  Medication review/ no new meds Takes saw palmetto but does not know  dose   Fall assessment: did fall one time but didn't get hurt; hurt he was taking to much MS and dropped the dosage and falls stopped Gait assessment; has an issue putting shoes on/ has tools to navigate this Pain is burdensome  Sleep patterns at hs  Urinary or fecal incontinence reviewed: none   Counseling: Colonoscopy; 11/2011/ fup in 10 years EKG 09/2012 Hearing: has tinnitus in both ears; 2000hz   Ophthalmology exam; Summer;  Vernon Bishop; [phone] Immunizations Due  None  Current Care Team reviewed and updated Vernon Bishop left  not sure of who he is following now;  Dr. Paralee Bishop  Cardiac Risk Factors include: family history of premature cardiovascular disease;hypertension;obesity (BMI >30kg/m2) (will [facility]; pool or other class; chair yoga)     Objective:    Vitals: Ht 6' (1.829 m)  Wt 230 lb 4 oz (104.441 kg)  BMI 31.22 kg/m2  Tobacco History  Smoking status  . Former Smoker -- 0.50 packs/day for 12 years  . Types: Cigarettes  . Quit date: 03/31/1971  Smokeless tobacco  . Not on file    Comment: Started at age 20     Counseling given: Yes   Past Medical History  Diagnosis Date  . Hypertension   . LBP (low back pain)   . Palpitations   . ED (erectile dysfunction)   . OSA on CPAP   . Hypogonadism  male   . Vitamin D deficiency   . Hyperlipidemia   . Atrial fibrillation ([place])     1992  . Depression   . PTSD (post-traumatic stress disorder)   . MVP (mitral valve prolapse)   . Swallowing problem     HX OF PILLS GETTING "STUCK" IN THROAT AT TIMES  . Diabetes mellitus     type II  - PT STATES NOT ON ANY DIABETIC MEDICINES  . Osteoarthritis, hand     SEVERE LOWER BACK PAIN, AND RESTLESS LEG SYNDROME  . Osteoarthritis   . DDD (degenerative disc disease)   . DDD (degenerative disc disease)    Past Surgical History  Procedure Laterality Date  . Back surgery  1987  . Cervical laminectomy  2004    Vernon Bishop, rod was placed- HAS  ROM LIMITATIONS  . Appendectomy    . Cholecystectomy    . Colonoscopy    . Knee surgery      left knee/arthroscopic  . Total hip arthroplasty Right 03/14/2013    Procedure: RIGHT TOTAL HIP ARTHROPLASTY ANTERIOR APPROACH;  Surgeon: Vernon Pole, MD;  Location: [facility];  Service: Orthopedics;  Laterality: Right;   Family History  Problem Relation Age of Onset  . Allergies Mother   . Stroke Mother     Clotting disorders  . Heart disease Mother   . Heart disease Father   . Heart attack Father 12  . Rheum arthritis Paternal Grandmother   . Other Paternal Grandmother     Rheumatism  . Coronary artery disease Other   . Diabetes Other   . Hypertension Other   . Heart disease Brother   . Esophageal cancer Maternal Aunt   . Colon cancer Neg Hx   . Rectal cancer Neg Hx   . Stomach cancer Neg Hx    History  Sexual Activity  . Sexual Activity: Yes    Outpatient Encounter Prescriptions as of 01/02/2015  Medication Sig  . acetaminophen (TYLENOL) 500 MG tablet Take 2 tablets (1,000 mg total) by mouth every 8 (eight) hours as needed for mild pain.  Marland Kitchen amLODipine (NORVASC) 10 MG tablet TAKE 1 TABLET BY MOUTH EVERY MORNING.  Marland Kitchen aspirin EC 81 MG tablet Take 81 mg by mouth daily.  . benazepril-hydrochlorthiazide (LOTENSIN HCT) 20-12.5 MG per tablet TAKE 2 TABLETS BY MOUTH DAILY.  Marland Kitchen buPROPion (WELLBUTRIN SR) 150 MG 12 hr tablet TAKE 1 TABLET BY MOUTH 2 TIMES DAILY.  Marland Kitchen Cholecalciferol (VITAMIN D3) 1000 UNIT tablet Take 1,000 Units by mouth daily.    . clotrimazole-betamethasone (LOTRISONE) cream Apply 1 application topically 2 (two) times daily.  . Cyanocobalamin (VITAMIN B-12 CR) 1000 MCG TBCR Take 1,000 mcg by mouth daily.    Marland Kitchen docusate sodium 100 MG CAPS Take 100 mg by mouth 2 (two) times daily.  . ferrous sulfate 325 (65 FE) MG tablet Take 1 tablet (325 mg total) by mouth 3 (three) times daily after meals.  Marland Kitchen FLUoxetine (PROZAC) 40 MG capsule TAKE 1 CAPSULE BY MOUTH DAILY.  Marland Kitchen gabapentin  (NEURONTIN) 300 MG capsule TAKE 1 CAPSULE BY MOUTH 3 TIMES DAILY.  Marland Kitchen morphine (MS CONTIN) 30 MG 12 hr tablet Take 1 tablet (30 mg total) by mouth every 12 (twelve) hours.  . Multiple Vitamin (MULTIVITAMIN) tablet Take 1 tablet by mouth daily.  Marland Kitchen oxyCODONE (ROXICODONE) 15 MG immediate release tablet Take 1 tablet (15 mg total) by mouth every 6 (six) hours as needed.  . polyethylene glycol (MIRALAX / GLYCOLAX) packet Take 17  g by mouth 2 (two) times daily.  . propranolol (INDERAL) 80 MG tablet TAKE 1 TABLET BY MOUTH 3 TIMES DAILY.  Marland Kitchen testosterone cypionate (DEPOTESTOTERONE CYPIONATE) 100 MG/ML injection Inject 6 mLs (600 mg total) into the muscle every 14 (fourteen) days. For IM use only  . testosterone cypionate (DEPOTESTOTERONE CYPIONATE) 200 MG/ML injection INJECT 3 MILLILITERS (600 MG TOTAL) INTO THE MUSCLE EVERY 14 DAYS.  Marland Kitchen tiZANidine (ZANAFLEX) 4 MG tablet TAKE 1 TABLET BY MOUTH EVERY 8 HOURS AS NEEDED FOR MUSCLE SPASMS.  Marland Kitchen traZODone (DESYREL) 50 MG tablet Take 1-2 tablets (50-100 mg total) by mouth at bedtime.  . clonazePAM (KLONOPIN) 0.5 MG tablet Take 1 tablet (0.5 mg total) by mouth 2 (two) times daily as needed for anxiety. Take 1/2 to 1 tab by mouth twice daily as needed for anxiety (Patient not taking: Reported on 12/05/2014)   No facility-administered encounter medications on file as of 01/02/2015.    Activities of Daily Living In your present state of health, do you have any difficulty performing the following activities: 01/02/2015 07/13/2014  Hearing? N N  Vision? N N  Difficulty concentrating or making decisions? Y N  Walking or climbing stairs? Y N  Dressing or bathing? N N  Doing errands, shopping? N N  Preparing Food and eating ? N -  Using the Toilet? N -  In the past six months, have you accidently leaked urine? N -  Do you have problems with loss of bowel control? N -  Managing your Medications? N -  Managing your Finances? N -  Housekeeping or managing your  Housekeeping? N -    Patient Care Team: Cassandria Anger, MD as PCP - General (Internal Medicine) Vernon Cancel, MD as Consulting Physician (Orthopedic Surgery)   Assessment:     Medicare questionnaire screening were completed, i.e. Functional; fall risk; depression, memory loss and hearing.   MOOD; The patient verbalized depression hx; affect appropriate today; engaging during assessment; but admits to periods of "feeling like he is in a hole". Asked for permission to proceed with depression assessment, but also asked if talking made it worse or was a trigger and he stated it was, so deferred any further assessment at this time. 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  Uses his humor to cope. States he will reach out to Dr. Alain Marion for increases in depression. Discussed counseling as needed.    All immunizations and health maintenance protocols were reviewed with the patient and needed orders were placed. Has had eye exam; Deferred labs to CPE 10/18.  Education provided for laboratory screens;    Medication reconciliation, past medical history, social history, problem list and allergies were reviewed in detail with the patient  Goals were established by the [facility]; would like try the pool in lieu of back pain.   End of life planning was discussed. Has LW and will bring a copy.    Exercise Activities and Dietary recommendations Current Exercise Habits:: Exercise is limited by  Goals    . Exercise 150 minutes per week (moderate activity)     Thinking about joining the [facility] and considering pool exercise; Suggested stretching exercise       Fall Risk Fall Risk  01/02/2015 10/15/2014 07/13/2014  Falls in the past year? No Yes No  Number falls in past yr: - 1 -  Injury with Fall? - No -  Risk for  fall due to : Impaired mobility;Impaired balance/gait - -   Depression Screen PHQ 2/9 Scores  10/15/2014  PHQ - 2 Score 1   See assessment note regarding depression, hx of with ongoing medical treatment. States he is ok today. Would outreach Dr. Alain Marion if needed.  Cognitive Testing No flowsheet data found.  Immunization History  Administered Date(s) Administered  . Influenza Split 01/28/2011, 12/18/2011  . Influenza Whole 02/14/2007, 12/11/2009  . Influenza,inj,Quad PF,36+ Mos 01/03/2013, 01/10/2014, 12/05/2014  . Pneumococcal Conjugate-13 05/08/2013  . Pneumococcal Polysaccharide-23 08/27/2009  . Td 03/30/2008  . Zoster 04/13/2014   Screening Tests Health Maintenance  Topic Date Due  . OPHTHALMOLOGY EXAM  11/29/1950  . URINE MICROALBUMIN  11/29/1950  . FOOT EXAM  08/24/2013  . HEMOGLOBIN A1C  07/12/2014  . INFLUENZA VACCINE  10/29/2015  . TETANUS/TDAP  03/30/2018  . COLONOSCOPY  12/16/2021  . ZOSTAVAX  Completed  . PNA vac Low Risk Adult  Completed      Plan:     Will review pain issues with Dr. Alain Marion for appropriateness of an alternative Will consider [employer] Pool exercise or other  Agrees to try to eat a small amount at lunch  To discuss any mood issues with Dr. Alain Marion;  Enjoys cooking but is limited by pain. Declines TKR at present.  During the course of the visit the patient was educated and counseled about the following appropriate screening and preventive services:   Vaccines to include Pneumoccal, Influenza, Hepatitis B, Td, Zostavax, HCV  Electrocardiogram- 09/2012  Cardiovascular Disease/ deferred to CPE  Colorectal cancer screening/ screen completed  Diabetes screening/ normal in 2015  Prostate Cancer Screening/ deferred  Glaucoma screening- eye exam this summer  Nutrition counseling ; educated   Exercise goal set to assist with weight loss    Patient Instructions (the written plan) was given to the patient.    FYBOF,BPZWC, RN  01/02/2015   Medical screening examination/treatment/procedure(s) were performed by non-physician  practitioner and as supervising physician I was immediately available for consultation/collaboration. I agree with above. Walker Kehr, MD

## 2015-01-11 ENCOUNTER — Telehealth: Payer: Self-pay | Admitting: Internal Medicine

## 2015-01-11 NOTE — Telephone Encounter (Signed)
Pt had an appointment on 10/18 that we needed to rescheduled.  I rescheduled it for 10/31 He was planning on picking up his prescriptions for oxyCODONE (ROXICODONE) 15 MG immediate release tablet [[id number]] and morphine (MS CONTIN) 30 MG 12 hr tablet [[id number]]   He is requesting to still be able to pick this up on the 18th. Can you please call him when its ready

## 2015-01-14 NOTE — Telephone Encounter (Signed)
Can you take a look at this?  This pt is needing his pain meds and plot is out of the office.    Best number [phone]

## 2015-01-15 ENCOUNTER — Encounter: Payer: Medicare Other | Admitting: Internal Medicine

## 2015-01-15 MED ORDER — OXYCODONE HCL 15 MG PO TABS: 15.0000 mg | ORAL_TABLET | Freq: Four times a day (QID) | ORAL | Status: DC | PRN

## 2015-01-15 MED ORDER — MORPHINE SULFATE ER 30 MG PO TBCR: 30.0000 mg | EXTENDED_RELEASE_TABLET | Freq: Two times a day (BID) | ORAL | Status: DC

## 2015-01-15 NOTE — Telephone Encounter (Signed)
Called pt spoke with wife inform rx ready for pick-up...Vernon Bishop

## 2015-01-15 NOTE — Telephone Encounter (Signed)
done

## 2015-01-15 NOTE — Telephone Encounter (Signed)
Pt called to check up on this requeset, he will be out of this med tomorrow. Please help, pt aware Dr. Camila Li is out of the office.

## 2015-01-15 NOTE — Telephone Encounter (Signed)
With dr Alain Marion being out of office---are you ok with refilling these until patient is seen on 10/31---please advise, thanks

## 2015-01-15 NOTE — Telephone Encounter (Signed)
Pls advise on msg below.../lmb 

## 2015-01-22 ENCOUNTER — Telehealth: Payer: Self-pay | Admitting: *Deleted

## 2015-01-22 NOTE — Telephone Encounter (Signed)
Receive call pt states he is needing refill on his oxycodone. When md was out Dr. Ronnald Ramp only gave him #30, but he normally get # 120. Pls advise...Vernon Bishop

## 2015-01-23 MED ORDER — OXYCODONE HCL 15 MG PO TABS: 15.0000 mg | ORAL_TABLET | Freq: Four times a day (QID) | ORAL | Status: DC | PRN

## 2015-01-23 NOTE — Telephone Encounter (Signed)
Ok if time Thx 

## 2015-01-23 NOTE — Telephone Encounter (Signed)
Called pt spoke with wife inform rx ready for pick-up...[name]

## 2015-01-28 ENCOUNTER — Ambulatory Visit (INDEPENDENT_AMBULATORY_CARE_PROVIDER_SITE_OTHER): Payer: Medicare Other | Admitting: Internal Medicine

## 2015-01-28 ENCOUNTER — Encounter: Payer: Self-pay | Admitting: Internal Medicine

## 2015-01-28 VITALS — BP 138/60 | HR 44 | Wt 232.0 lb

## 2015-01-28 DIAGNOSIS — F32A Depression, unspecified: Secondary | ICD-10-CM

## 2015-01-28 DIAGNOSIS — M5440 Lumbago with sciatica, unspecified side: Secondary | ICD-10-CM | POA: Diagnosis not present

## 2015-01-28 DIAGNOSIS — Z0000 Encounter for general adult medical examination without abnormal findings: Secondary | ICD-10-CM | POA: Diagnosis not present

## 2015-01-28 DIAGNOSIS — I480 Paroxysmal atrial fibrillation: Secondary | ICD-10-CM

## 2015-01-28 DIAGNOSIS — E119 Type 2 diabetes mellitus without complications: Secondary | ICD-10-CM | POA: Diagnosis not present

## 2015-01-28 DIAGNOSIS — E538 Deficiency of other specified B group vitamins: Secondary | ICD-10-CM | POA: Diagnosis not present

## 2015-01-28 DIAGNOSIS — E291 Testicular hypofunction: Secondary | ICD-10-CM

## 2015-01-28 DIAGNOSIS — F329 Major depressive disorder, single episode, unspecified: Secondary | ICD-10-CM

## 2015-01-28 DIAGNOSIS — G8929 Other chronic pain: Secondary | ICD-10-CM

## 2015-01-28 DIAGNOSIS — I10 Essential (primary) hypertension: Secondary | ICD-10-CM

## 2015-01-28 DIAGNOSIS — Z01818 Encounter for other preprocedural examination: Secondary | ICD-10-CM | POA: Insufficient documentation

## 2015-01-28 MED ORDER — OXYCODONE HCL 15 MG PO TABS: 15.0000 mg | ORAL_TABLET | Freq: Four times a day (QID) | ORAL | Status: DC | PRN

## 2015-01-28 MED ORDER — MORPHINE SULFATE ER 30 MG PO TBCR: 30.0000 mg | EXTENDED_RELEASE_TABLET | Freq: Two times a day (BID) | ORAL | Status: DC

## 2015-01-28 NOTE — Assessment & Plan Note (Signed)
Labs B12 

## 2015-01-28 NOTE — Assessment & Plan Note (Signed)
Oxycodone, MS-contin, Tizanidine Chronic Potential benefits of a long term opioids use as well as potential risks (i.e. addiction risk, apnea etc) and complications (i.e. Somnolence, constipation and others) were explained to the patient and were aknowledged.   

## 2015-01-28 NOTE — Patient Instructions (Signed)

## 2015-01-28 NOTE — Assessment & Plan Note (Signed)
Chronic On Testosterone inj  Potential benefits of a long term testosterone  use as well as potential risks  and complications were explained to the patient and were aknowledged.

## 2015-01-28 NOTE — Assessment & Plan Note (Signed)
Chronic Lotensin HCT, Amlodipine, Inderal

## 2015-01-28 NOTE — Progress Notes (Signed)
Subjective:  Patient ID: Vernon Bishop, male    DOB: [birth year]  Age: 74 y.o. MRN: [id number]  CC: No chief complaint on file.   HPI Vernon Bishop presents for LBP, HTN, hypogonadism, depression f/u. Well exam  Outpatient Prescriptions Prior to Visit  Medication Sig Dispense Refill  . acetaminophen (TYLENOL) 500 MG tablet Take 2 tablets (1,000 mg total) by mouth every 8 (eight) hours as needed for mild pain. 30 tablet 0  . amLODipine (NORVASC) 10 MG tablet TAKE 1 TABLET BY MOUTH EVERY MORNING. 90 tablet 3  . aspirin EC 81 MG tablet Take 81 mg by mouth daily.    . benazepril-hydrochlorthiazide (LOTENSIN HCT) 20-12.5 MG per tablet TAKE 2 TABLETS BY MOUTH DAILY. 60 tablet 5  . buPROPion (WELLBUTRIN SR) 150 MG 12 hr tablet TAKE 1 TABLET BY MOUTH 2 TIMES DAILY. 60 tablet 5  . Cholecalciferol (VITAMIN D3) 1000 UNIT tablet Take 1,000 Units by mouth daily.      . clonazePAM (KLONOPIN) 0.5 MG tablet Take 1 tablet (0.5 mg total) by mouth 2 (two) times daily as needed for anxiety. Take 1/2 to 1 tab by mouth twice daily as needed for anxiety 30 tablet 1  . clotrimazole-betamethasone (LOTRISONE) cream Apply 1 application topically 2 (two) times daily. 60 g 1  . Cyanocobalamin (VITAMIN B-12 CR) 1000 MCG TBCR Take 1,000 mcg by mouth daily.      Marland Kitchen docusate sodium 100 MG CAPS Take 100 mg by mouth 2 (two) times daily. 10 capsule 0  . ferrous sulfate 325 (65 FE) MG tablet Take 1 tablet (325 mg total) by mouth 3 (three) times daily after meals.  3  . FLUoxetine (PROZAC) 40 MG capsule TAKE 1 CAPSULE BY MOUTH DAILY. 90 capsule 2  . gabapentin (NEURONTIN) 300 MG capsule TAKE 1 CAPSULE BY MOUTH 3 TIMES DAILY. 90 capsule 3  . morphine (MS CONTIN) 30 MG 12 hr tablet Take 1 tablet (30 mg total) by mouth every 12 (twelve) hours. 60 tablet 0  . Multiple Vitamin (MULTIVITAMIN) tablet Take 1 tablet by mouth daily.    Marland Kitchen oxyCODONE (ROXICODONE) 15 MG immediate release tablet Take 1 tablet (15 mg total) by mouth every 6  (six) hours as needed. 120 tablet 0  . polyethylene glycol (MIRALAX / GLYCOLAX) packet Take 17 g by mouth 2 (two) times daily. 14 each 0  . propranolol (INDERAL) 80 MG tablet TAKE 1 TABLET BY MOUTH 3 TIMES DAILY. 270 tablet 1  . testosterone cypionate (DEPOTESTOTERONE CYPIONATE) 200 MG/ML injection INJECT 3 MILLILITERS (600 MG TOTAL) INTO THE MUSCLE EVERY 14 DAYS. 10 mL 3  . tiZANidine (ZANAFLEX) 4 MG tablet TAKE 1 TABLET BY MOUTH EVERY 8 HOURS AS NEEDED FOR MUSCLE SPASMS. 60 tablet 0  . traZODone (DESYREL) 50 MG tablet Take 1-2 tablets (50-100 mg total) by mouth at bedtime. 60 tablet 5  . testosterone cypionate (DEPOTESTOTERONE CYPIONATE) 100 MG/ML injection Inject 6 mLs (600 mg total) into the muscle every 14 (fourteen) days. For IM use only 10 mL 3   No facility-administered medications prior to visit.    ROS Review of Systems  Constitutional: Negative for appetite change, fatigue and unexpected weight change.  HENT: Negative for congestion, nosebleeds, sneezing, sore throat and trouble swallowing.   Eyes: Negative for itching and visual disturbance.  Respiratory: Negative for cough.   Cardiovascular: Negative for chest pain, palpitations and leg swelling.  Gastrointestinal: Negative for nausea, diarrhea, blood in stool and abdominal distention.  Genitourinary: Negative for  frequency and hematuria.  Musculoskeletal: Negative for back pain, joint swelling, gait problem and neck pain.  Skin: Negative for rash.  Neurological: Negative for dizziness, tremors, speech difficulty and weakness.  Psychiatric/Behavioral: Negative for suicidal ideas, sleep disturbance, dysphoric mood and agitation. The patient is not nervous/anxious.     Objective:  BP 138/60 mmHg  Pulse 44  Wt 232 lb (105.235 kg)  SpO2 94%  BP Readings from Last 3 Encounters:  01/28/15 138/60  12/05/14 140/78  10/15/14 148/80    Wt Readings from Last 3 Encounters:  01/28/15 232 lb (105.235 kg)  01/02/15 230 lb 4 oz  (104.441 kg)  12/05/14 228 lb 12.8 oz (103.783 kg)    Physical Exam  Constitutional: He is oriented to person, place, and time. He appears well-developed. No distress.  NAD  HENT:  Mouth/Throat: Oropharynx is clear and moist.  Eyes: Conjunctivae are normal. Pupils are equal, round, and reactive to light.  Neck: Normal range of motion. No JVD present. No thyromegaly present.  Cardiovascular: Normal rate, regular rhythm, normal heart sounds and intact distal pulses.  Exam reveals no gallop and no friction rub.   No murmur heard. Pulmonary/Chest: Effort normal and breath sounds normal. No respiratory distress. He has no wheezes. He has no rales. He exhibits no tenderness.  Abdominal: Soft. Bowel sounds are normal. He exhibits no distension and no mass. There is no tenderness. There is no rebound and no guarding.  Musculoskeletal: Normal range of motion. He exhibits tenderness. He exhibits no edema.  Lymphadenopathy:    He has no cervical adenopathy.  Neurological: He is alert and oriented to person, place, and time. He has normal reflexes. No cranial nerve deficit. He exhibits normal muscle tone. He displays a negative Romberg sign. Coordination and gait normal.  Skin: Skin is warm and dry. No rash noted.  Psychiatric: He has a normal mood and affect. His behavior is normal. Judgment and thought content normal.    Lab Results  Component Value Date   WBC 7.4 03/15/2013   HGB 13.3 03/15/2013   HCT 39.3 03/15/2013   PLT 157 03/15/2013   GLUCOSE 81 01/10/2014   CHOL 128 12/09/2009   TRIG 134.0 12/09/2009   HDL 20.40* 12/09/2009   LDLCALC 81 12/09/2009   ALT 26 01/10/2014   AST 24 01/10/2014   NA 133* 01/10/2014   K 4.1 01/10/2014   CL 100 01/10/2014   CREATININE 0.9 01/10/2014   BUN 12 01/10/2014   CO2 26 01/10/2014   TSH 0.70 11/01/2012   PSA 0.57 12/09/2009   INR 1.00 03/07/2013   HGBA1C 5.1 01/10/2014    Dg Chest 2 View  03/07/2013  CLINICAL DATA:  Preoperative  radiograph. Right total hip arthroplasty. EXAM: CHEST  2 VIEW COMPARISON:  CT chest 01/24/2004. FINDINGS: Old granulomatous disease is present. The cardiopericardial silhouette is borderline for projection. Cervical ACDF plate. No airspace disease or effusion. Thoracic spondylosis. Surgical clips in the upper abdomen are likely for cholecystectomy. IMPRESSION: No active cardiopulmonary disease.  Old granulomatous disease. Electronically Signed   By: Dereck Ligas M.D.   On: 03/07/2013 16:23    Assessment & Plan:   There are no diagnoses linked to this encounter. I am having Mr. Carlota Raspberry maintain his Vitamin B-12 CR, cholecalciferol, DSS, ferrous sulfate, polyethylene glycol, acetaminophen, propranolol, FLUoxetine, clotrimazole-betamethasone, traZODone, clonazePAM, testosterone cypionate, buPROPion, gabapentin, amLODipine, tiZANidine, benazepril-hydrochlorthiazide, aspirin EC, multivitamin, morphine, and oxyCODONE.  No orders of the defined types were placed in this encounter.  Follow-up: No Follow-up on file.  Walker Kehr, MD

## 2015-01-28 NOTE — Assessment & Plan Note (Addendum)
On diet A1c 

## 2015-01-28 NOTE — Assessment & Plan Note (Signed)
Doing well 

## 2015-01-28 NOTE — Progress Notes (Signed)
Pre visit review using our clinic review tool, if applicable. No additional management support is needed unless otherwise documented below in the visit note. 

## 2015-01-28 NOTE — Assessment & Plan Note (Signed)
Chronic  On Fluoxetine

## 2015-02-07 ENCOUNTER — Other Ambulatory Visit: Payer: Self-pay | Admitting: Pulmonary Disease

## 2015-02-07 DIAGNOSIS — Z9989 Dependence on other enabling machines and devices: Principal | ICD-10-CM

## 2015-02-07 DIAGNOSIS — G4733 Obstructive sleep apnea (adult) (pediatric): Secondary | ICD-10-CM

## 2015-02-12 ENCOUNTER — Other Ambulatory Visit: Payer: Self-pay | Admitting: Internal Medicine

## 2015-02-12 NOTE — Telephone Encounter (Signed)
Ok to refill 

## 2015-02-13 ENCOUNTER — Other Ambulatory Visit (INDEPENDENT_AMBULATORY_CARE_PROVIDER_SITE_OTHER): Payer: Medicare Other

## 2015-02-13 DIAGNOSIS — E119 Type 2 diabetes mellitus without complications: Secondary | ICD-10-CM

## 2015-02-13 DIAGNOSIS — E291 Testicular hypofunction: Secondary | ICD-10-CM

## 2015-02-13 DIAGNOSIS — E538 Deficiency of other specified B group vitamins: Secondary | ICD-10-CM

## 2015-02-13 DIAGNOSIS — E785 Hyperlipidemia, unspecified: Secondary | ICD-10-CM

## 2015-02-13 DIAGNOSIS — F329 Major depressive disorder, single episode, unspecified: Secondary | ICD-10-CM

## 2015-02-13 DIAGNOSIS — M5440 Lumbago with sciatica, unspecified side: Secondary | ICD-10-CM

## 2015-02-13 DIAGNOSIS — F32A Depression, unspecified: Secondary | ICD-10-CM

## 2015-02-13 DIAGNOSIS — I480 Paroxysmal atrial fibrillation: Secondary | ICD-10-CM

## 2015-02-13 DIAGNOSIS — Z0000 Encounter for general adult medical examination without abnormal findings: Secondary | ICD-10-CM

## 2015-02-13 DIAGNOSIS — I10 Essential (primary) hypertension: Secondary | ICD-10-CM

## 2015-02-13 DIAGNOSIS — G8929 Other chronic pain: Secondary | ICD-10-CM

## 2015-02-13 LAB — URINALYSIS
Bilirubin Urine: NEGATIVE
Hgb urine dipstick: NEGATIVE
KETONES UR: NEGATIVE
Leukocytes, UA: NEGATIVE
Nitrite: NEGATIVE
SPECIFIC GRAVITY, URINE: 1.01 (ref 1.000–1.030)
Total Protein, Urine: NEGATIVE
URINE GLUCOSE: NEGATIVE
UROBILINOGEN UA: 0.2 (ref 0.0–1.0)
pH: 7 (ref 5.0–8.0)

## 2015-02-13 LAB — VITAMIN B12: VITAMIN B 12: 696 pg/mL (ref 211–911)

## 2015-02-13 LAB — BASIC METABOLIC PANEL
BUN: 18 mg/dL (ref 6–23)
CO2: 31 mEq/L (ref 19–32)
Calcium: 9.5 mg/dL (ref 8.4–10.5)
Chloride: 99 mEq/L (ref 96–112)
Creatinine, Ser: 0.95 mg/dL (ref 0.40–1.50)
GFR: 82.32 mL/min (ref >60.00)
Glucose, Bld: 82 mg/dL (ref 70–99)
POTASSIUM: 3.7 meq/L (ref 3.5–5.1)
Sodium: 137 mEq/L (ref 135–145)

## 2015-02-13 LAB — LIPID PANEL
CHOLESTEROL: 149 mg/dL (ref 0–200)
HDL: 26.9 mg/dL — ABNORMAL LOW (ref >39.00)
LDL Cholesterol: 93 mg/dL (ref 0–99)
NonHDL: 121.99
TRIGLYCERIDES: 146 mg/dL (ref 0.0–149.0)
Total CHOL/HDL Ratio: 6
VLDL: 29.2 mg/dL (ref 0.0–40.0)

## 2015-02-13 LAB — CBC WITH DIFFERENTIAL/PLATELET
BASOS ABS: 0 10*3/uL (ref 0.0–0.1)
BASOS PCT: 0.6 % (ref 0.0–3.0)
EOS ABS: 0.2 10*3/uL (ref 0.0–0.7)
Eosinophils Relative: 2.2 % (ref 0.0–5.0)
HEMATOCRIT: 50.5 % (ref 39.0–52.0)
Hemoglobin: 16.8 g/dL (ref 13.0–17.0)
LYMPHS ABS: 2.1 10*3/uL (ref 0.7–4.0)
LYMPHS PCT: 28.2 % (ref 12.0–46.0)
MCHC: 33.2 g/dL (ref 30.0–36.0)
MCV: 91.5 fl (ref 78.0–100.0)
MONO ABS: 0.7 10*3/uL (ref 0.1–1.0)
Monocytes Relative: 9.3 % (ref 3.0–12.0)
NEUTROS ABS: 4.4 10*3/uL (ref 1.4–7.7)
NEUTROS PCT: 59.7 % (ref 43.0–77.0)
PLATELETS: 196 10*3/uL (ref 150.0–400.0)
RBC: 5.52 Mil/uL (ref 4.22–5.81)
RDW: 13.6 % (ref 11.5–15.5)
WBC: 7.4 10*3/uL (ref 4.0–10.5)

## 2015-02-13 LAB — PSA: PSA: 0.74 ng/mL (ref 0.10–4.00)

## 2015-02-13 LAB — HEPATIC FUNCTION PANEL
ALK PHOS: 69 U/L (ref 39–117)
ALT: 20 U/L (ref 0–53)
AST: 24 U/L (ref 0–37)
Albumin: 4.3 g/dL (ref 3.5–5.2)
BILIRUBIN DIRECT: 0.1 mg/dL (ref 0.0–0.3)
BILIRUBIN TOTAL: 0.7 mg/dL (ref 0.2–1.2)
TOTAL PROTEIN: 7 g/dL (ref 6.0–8.3)

## 2015-02-13 LAB — TSH: TSH: 2.37 u[IU]/mL (ref 0.35–4.50)

## 2015-02-13 LAB — TESTOSTERONE: Testosterone: 838.28 ng/dL (ref 300.00–890.00)

## 2015-02-20 ENCOUNTER — Other Ambulatory Visit: Payer: Self-pay | Admitting: Internal Medicine

## 2015-02-28 ENCOUNTER — Other Ambulatory Visit: Payer: Self-pay | Admitting: Internal Medicine

## 2015-03-01 NOTE — Telephone Encounter (Signed)
Left msg on triage stating he is having some swelling in his feet and ankles. MD had rx torsemide once before wanting to get refill...[name]

## 2015-03-04 ENCOUNTER — Telehealth: Payer: Self-pay | Admitting: *Deleted

## 2015-03-04 NOTE — Telephone Encounter (Signed)
Vernon Regal, MA at 03/01/2015 12:20 PM     Status: Signed       Expand All Collapse All   Left msg on triage stating he is having some swelling in his feet and ankles. MD had rx torsemide once before wanting to get refill.../lmb           Pt call on Friday pls advise on msg...Vernon Bishop

## 2015-03-04 NOTE — Telephone Encounter (Signed)
Notified pt with md response.../lmb 

## 2015-03-04 NOTE — Telephone Encounter (Signed)
Reduce Amlodipine to 5 mg/d Thx

## 2015-04-16 ENCOUNTER — Other Ambulatory Visit: Payer: Self-pay | Admitting: Internal Medicine

## 2015-04-19 NOTE — Telephone Encounter (Signed)
Testosterone inj rx called in to [facility] pharmacist

## 2015-05-01 ENCOUNTER — Encounter: Payer: Self-pay | Admitting: Internal Medicine

## 2015-05-01 ENCOUNTER — Ambulatory Visit (INDEPENDENT_AMBULATORY_CARE_PROVIDER_SITE_OTHER): Payer: Medicare Other | Admitting: Internal Medicine

## 2015-05-01 VITALS — BP 140/68 | HR 50 | Wt 225.0 lb

## 2015-05-01 DIAGNOSIS — G8929 Other chronic pain: Secondary | ICD-10-CM

## 2015-05-01 DIAGNOSIS — E663 Overweight: Secondary | ICD-10-CM | POA: Diagnosis not present

## 2015-05-01 DIAGNOSIS — M5440 Lumbago with sciatica, unspecified side: Secondary | ICD-10-CM

## 2015-05-01 DIAGNOSIS — E119 Type 2 diabetes mellitus without complications: Secondary | ICD-10-CM | POA: Diagnosis not present

## 2015-05-01 DIAGNOSIS — E785 Hyperlipidemia, unspecified: Secondary | ICD-10-CM

## 2015-05-01 DIAGNOSIS — E538 Deficiency of other specified B group vitamins: Secondary | ICD-10-CM

## 2015-05-01 MED ORDER — MORPHINE SULFATE ER 30 MG PO TBCR: 30.0000 mg | EXTENDED_RELEASE_TABLET | Freq: Two times a day (BID) | ORAL | Status: DC

## 2015-05-01 MED ORDER — OXYCODONE HCL 15 MG PO TABS
15.0000 mg | ORAL_TABLET | Freq: Four times a day (QID) | ORAL | Status: DC | PRN
Start: 2015-05-01 — End: 2015-07-29

## 2015-05-01 MED ORDER — OXYCODONE HCL 15 MG PO TABS
15.0000 mg | ORAL_TABLET | Freq: Four times a day (QID) | ORAL | Status: DC | PRN
Start: 2015-05-01 — End: 2015-05-01

## 2015-05-01 MED ORDER — OXYCODONE HCL 15 MG PO TABS: 15.0000 mg | ORAL_TABLET | Freq: Four times a day (QID) | ORAL | Status: DC | PRN

## 2015-05-01 NOTE — Assessment & Plan Note (Signed)
On B12 

## 2015-05-01 NOTE — Assessment & Plan Note (Signed)
  On diet  

## 2015-05-01 NOTE — Assessment & Plan Note (Addendum)
Oxycodone, MS-contin, Tizanidine Chronic Potential benefits of a long term opioids use as well as potential risks (i.e. addiction risk, apnea etc) and complications (i.e. Somnolence, constipation and others) were explained to the patient and were aknowledged.  1/17 worse - ref to Dr Nelva Bush

## 2015-05-01 NOTE — Progress Notes (Signed)
Subjective:  Patient ID: Vernon Bishop, male    DOB: 1940-08-05  Age: 75 y.o. MRN: [id number]  CC: No chief complaint on file.   HPI Vernon Bishop presents for LBP, anxiety, depression f/u  Outpatient Prescriptions Prior to Visit  Medication Sig Dispense Refill  . acetaminophen (TYLENOL) 500 MG tablet Take 2 tablets (1,000 mg total) by mouth every 8 (eight) hours as needed for mild pain. 30 tablet 0  . amLODipine (NORVASC) 10 MG tablet TAKE 1 TABLET BY MOUTH EVERY MORNING. (Patient taking differently: TAKE 1/2 TABLET BY MOUTH EVERY MORNING.) 90 tablet 3  . aspirin EC 81 MG tablet Take 81 mg by mouth daily.    . benazepril-hydrochlorthiazide (LOTENSIN HCT) 20-12.5 MG per tablet TAKE 2 TABLETS BY MOUTH DAILY. 60 tablet 5  . Cholecalciferol (VITAMIN D3) 1000 UNIT tablet Take 1,000 Units by mouth daily.      . clonazePAM (KLONOPIN) 0.5 MG tablet Take 1 tablet (0.5 mg total) by mouth 2 (two) times daily as needed for anxiety. Take 1/2 to 1 tab by mouth twice daily as needed for anxiety 30 tablet 1  . clotrimazole-betamethasone (LOTRISONE) cream Apply 1 application topically 2 (two) times daily. 60 g 1  . Cyanocobalamin (VITAMIN B-12 CR) 1000 MCG TBCR Take 1,000 mcg by mouth daily.      Marland Kitchen docusate sodium 100 MG CAPS Take 100 mg by mouth 2 (two) times daily. 10 capsule 0  . ferrous sulfate 325 (65 FE) MG tablet Take 1 tablet (325 mg total) by mouth 3 (three) times daily after meals.  3  . FLUoxetine (PROZAC) 40 MG capsule TAKE 1 CAPSULE BY MOUTH DAILY. 90 capsule 2  . gabapentin (NEURONTIN) 300 MG capsule TAKE 1 CAPSULE BY MOUTH 3 TIMES DAILY. 90 capsule 3  . Multiple Vitamin (MULTIVITAMIN) tablet Take 1 tablet by mouth daily.    . polyethylene glycol (MIRALAX / GLYCOLAX) packet Take 17 g by mouth 2 (two) times daily. 14 each 0  . propranolol (INDERAL) 80 MG tablet TAKE 1 TABLET BY MOUTH 3 TIMES DAILY. 270 tablet 1  . testosterone cypionate (DEPOTESTOSTERONE CYPIONATE) 200 MG/ML injection  INJECT 3 MILLILITERS (600 MG TOTAL) INTO THE MUSCLE EVERY 14 DAYS. 10 mL 3  . tiZANidine (ZANAFLEX) 4 MG tablet TAKE 1 TABLET BY MOUTH EVERY 8 HOURS AS NEEDED FOR MUSCLE SPASMS. 60 tablet 0  . traZODone (DESYREL) 50 MG tablet TAKE 1 TO 2 TABLETS BY MOUTH AT BEDTIME. 60 tablet 5  . morphine (MS CONTIN) 30 MG 12 hr tablet Take 1 tablet (30 mg total) by mouth every 12 (twelve) hours. 60 tablet 0  . oxyCODONE (ROXICODONE) 15 MG immediate release tablet Take 1 tablet (15 mg total) by mouth every 6 (six) hours as needed. Please fill on or after 04/17/15 120 tablet 0  . buPROPion (WELLBUTRIN SR) 150 MG 12 hr tablet TAKE 1 TABLET BY MOUTH 2 TIMES DAILY. (Patient not taking: Reported on 05/01/2015) 60 tablet 5   No facility-administered medications prior to visit.    ROS Review of Systems  Constitutional: Negative for appetite change, fatigue and unexpected weight change.  HENT: Negative for congestion, nosebleeds, sneezing, sore throat and trouble swallowing.   Eyes: Negative for itching and visual disturbance.  Respiratory: Negative for cough.   Cardiovascular: Negative for chest pain, palpitations and leg swelling.  Gastrointestinal: Negative for nausea, diarrhea, blood in stool and abdominal distention.  Genitourinary: Negative for frequency and hematuria.  Musculoskeletal: Positive for back pain, arthralgias and  gait problem. Negative for joint swelling and neck pain.  Skin: Negative for rash.  Neurological: Negative for dizziness, tremors, speech difficulty and weakness.  Psychiatric/Behavioral: Positive for decreased concentration. Negative for suicidal ideas, sleep disturbance, dysphoric mood and agitation. The patient is nervous/anxious.     Objective:  BP 140/68 mmHg  Pulse 50  Wt 225 lb (102.059 kg)  SpO2 97%  BP Readings from Last 3 Encounters:  05/01/15 140/68  01/28/15 138/60  12/05/14 140/78    Wt Readings from Last 3 Encounters:  05/01/15 225 lb (102.059 kg)  01/28/15 232  lb (105.235 kg)  01/02/15 230 lb 4 oz (104.441 kg)    Physical Exam  Constitutional: He is oriented to person, place, and time. He appears well-developed. No distress.  NAD  HENT:  Mouth/Throat: Oropharynx is clear and moist.  Eyes: Conjunctivae are normal. Pupils are equal, round, and reactive to light.  Neck: Normal range of motion. No JVD present. No thyromegaly present.  Cardiovascular: Normal rate, regular rhythm, normal heart sounds and intact distal pulses.  Exam reveals no gallop and no friction rub.   No murmur heard. Pulmonary/Chest: Effort normal and breath sounds normal. No respiratory distress. He has no wheezes. He has no rales. He exhibits no tenderness.  Abdominal: Soft. Bowel sounds are normal. He exhibits no distension and no mass. There is no tenderness. There is no rebound and no guarding.  Musculoskeletal: Normal range of motion. He exhibits tenderness. He exhibits no edema.  Lymphadenopathy:    He has no cervical adenopathy.  Neurological: He is alert and oriented to person, place, and time. He has normal reflexes. No cranial nerve deficit. He exhibits normal muscle tone. He displays a negative Romberg sign. Coordination and gait normal.  Skin: Skin is warm and dry. No rash noted.  Psychiatric: He has a normal mood and affect. His behavior is normal. Judgment and thought content normal.  Obese LS is tender  Lab Results  Component Value Date   WBC 7.4 02/13/2015   HGB 16.8 02/13/2015   HCT 50.5 02/13/2015   PLT 196.0 02/13/2015   GLUCOSE 82 02/13/2015   CHOL 149 02/13/2015   TRIG 146.0 02/13/2015   HDL 26.90* 02/13/2015   LDLCALC 93 02/13/2015   ALT 20 02/13/2015   AST 24 02/13/2015   NA 137 02/13/2015   K 3.7 02/13/2015   CL 99 02/13/2015   CREATININE 0.95 02/13/2015   BUN 18 02/13/2015   CO2 31 02/13/2015   TSH 2.37 02/13/2015   PSA 0.74 02/13/2015   INR 1.00 03/07/2013   HGBA1C 5.1 01/10/2014    Dg Chest 2 View  03/07/2013  CLINICAL DATA:   Preoperative radiograph. Right total hip arthroplasty. EXAM: CHEST  2 VIEW COMPARISON:  CT chest 01/24/2004. FINDINGS: Old granulomatous disease is present. The cardiopericardial silhouette is borderline for projection. Cervical ACDF plate. No airspace disease or effusion. Thoracic spondylosis. Surgical clips in the upper abdomen are likely for cholecystectomy. IMPRESSION: No active cardiopulmonary disease.  Old granulomatous disease. Electronically Signed   By: Dereck Ligas M.D.   On: 03/07/2013 16:23    Assessment & Plan:   Diagnoses and all orders for this visit:  Chronic midline low back pain with sciatica, sciatica laterality unspecified -     Ambulatory referral to Orthopedic Surgery  Vitamin B12 deficiency  Diabetes mellitus type 2, diet-controlled ([place])  Overweight (BMI 25.0-29.9)  Dyslipidemia  Other orders -     Discontinue: oxyCODONE (ROXICODONE) 15 MG immediate release tablet; Take  1 tablet (15 mg total) by mouth every 6 (six) hours as needed. Please fill on or after 05/18/15 -     Discontinue: morphine (MS CONTIN) 30 MG 12 hr tablet; Take 1 tablet (30 mg total) by mouth every 12 (twelve) hours. -     Discontinue: morphine (MS CONTIN) 30 MG 12 hr tablet; Take 1 tablet (30 mg total) by mouth every 12 (twelve) hours. -     Discontinue: oxyCODONE (ROXICODONE) 15 MG immediate release tablet; Take 1 tablet (15 mg total) by mouth every 6 (six) hours as needed. Please fill on or after 06/15/15 -     oxyCODONE (ROXICODONE) 15 MG immediate release tablet; Take 1 tablet (15 mg total) by mouth every 6 (six) hours as needed. Please fill on or after 07/16/15 -     morphine (MS CONTIN) 30 MG 12 hr tablet; Take 1 tablet (30 mg total) by mouth every 12 (twelve) hours.  I have discontinued Mr. Murnan oxyCODONE and oxyCODONE. I have also changed his oxyCODONE. Additionally, I am having him maintain his Vitamin B-12 CR, cholecalciferol, DSS, ferrous sulfate, polyethylene glycol, acetaminophen,  clotrimazole-betamethasone, clonazePAM, buPROPion, gabapentin, amLODipine, benazepril-hydrochlorthiazide, aspirin EC, multivitamin, FLUoxetine, propranolol, tiZANidine, traZODone, testosterone cypionate, and morphine.  Meds ordered this encounter  Medications  . DISCONTD: oxyCODONE (ROXICODONE) 15 MG immediate release tablet    Sig: Take 1 tablet (15 mg total) by mouth every 6 (six) hours as needed. Please fill on or after 05/18/15    Dispense:  120 tablet    Refill:  0  . DISCONTD: morphine (MS CONTIN) 30 MG 12 hr tablet    Sig: Take 1 tablet (30 mg total) by mouth every 12 (twelve) hours.    Dispense:  60 tablet    Refill:  0    Please fill on or after 05/18/15  . DISCONTD: morphine (MS CONTIN) 30 MG 12 hr tablet    Sig: Take 1 tablet (30 mg total) by mouth every 12 (twelve) hours.    Dispense:  60 tablet    Refill:  0    Please fill on or after 06/15/15  . DISCONTD: oxyCODONE (ROXICODONE) 15 MG immediate release tablet    Sig: Take 1 tablet (15 mg total) by mouth every 6 (six) hours as needed. Please fill on or after 06/15/15    Dispense:  120 tablet    Refill:  0  . oxyCODONE (ROXICODONE) 15 MG immediate release tablet    Sig: Take 1 tablet (15 mg total) by mouth every 6 (six) hours as needed. Please fill on or after 07/16/15    Dispense:  120 tablet    Refill:  0  . morphine (MS CONTIN) 30 MG 12 hr tablet    Sig: Take 1 tablet (30 mg total) by mouth every 12 (twelve) hours.    Dispense:  60 tablet    Refill:  0    Please fill on or after 07/16/15     Follow-up: No Follow-up on file.  Walker Kehr, MD

## 2015-05-01 NOTE — Progress Notes (Signed)
Pre visit review using our clinic review tool, if applicable. No additional management support is needed unless otherwise documented below in the visit note. 

## 2015-05-01 NOTE — Assessment & Plan Note (Signed)
Chronic, diet controlled Lost 7 lbs

## 2015-05-01 NOTE — Assessment & Plan Note (Signed)
Wt Readings from Last 3 Encounters:  05/01/15 225 lb (102.059 kg)  01/28/15 232 lb (105.235 kg)  01/02/15 230 lb 4 oz (104.441 kg)

## 2015-05-24 ENCOUNTER — Other Ambulatory Visit: Payer: Self-pay | Admitting: Internal Medicine

## 2015-07-29 ENCOUNTER — Ambulatory Visit (INDEPENDENT_AMBULATORY_CARE_PROVIDER_SITE_OTHER): Payer: Medicare Other | Admitting: Internal Medicine

## 2015-07-29 ENCOUNTER — Encounter: Payer: Self-pay | Admitting: Internal Medicine

## 2015-07-29 VITALS — BP 160/70 | HR 48 | Wt 227.0 lb

## 2015-07-29 DIAGNOSIS — F411 Generalized anxiety disorder: Secondary | ICD-10-CM

## 2015-07-29 DIAGNOSIS — E119 Type 2 diabetes mellitus without complications: Secondary | ICD-10-CM | POA: Diagnosis not present

## 2015-07-29 DIAGNOSIS — G5620 Lesion of ulnar nerve, unspecified upper limb: Secondary | ICD-10-CM | POA: Insufficient documentation

## 2015-07-29 DIAGNOSIS — E538 Deficiency of other specified B group vitamins: Secondary | ICD-10-CM

## 2015-07-29 DIAGNOSIS — G5600 Carpal tunnel syndrome, unspecified upper limb: Secondary | ICD-10-CM | POA: Insufficient documentation

## 2015-07-29 DIAGNOSIS — G5622 Lesion of ulnar nerve, left upper limb: Secondary | ICD-10-CM | POA: Diagnosis not present

## 2015-07-29 MED ORDER — MORPHINE SULFATE ER 30 MG PO TBCR: 30.0000 mg | EXTENDED_RELEASE_TABLET | Freq: Two times a day (BID) | ORAL | Status: DC

## 2015-07-29 MED ORDER — BUPROPION HCL ER (SR) 150 MG PO TB12: 150.0000 mg | ORAL_TABLET | Freq: Two times a day (BID) | ORAL | Status: DC

## 2015-07-29 MED ORDER — OXYCODONE HCL 15 MG PO TABS: 15.0000 mg | ORAL_TABLET | Freq: Four times a day (QID) | ORAL | Status: DC | PRN

## 2015-07-29 MED ORDER — CLONAZEPAM 0.5 MG PO TABS: 0.5000 mg | ORAL_TABLET | Freq: Three times a day (TID) | ORAL | Status: DC | PRN

## 2015-07-29 MED ORDER — METHYLPREDNISOLONE ACETATE 80 MG/ML IJ SUSP
80.0000 mg | Freq: Once | INTRAMUSCULAR | Status: AC
  Administered 2015-07-29: 80 mg via INTRAMUSCULAR

## 2015-07-29 NOTE — Progress Notes (Signed)
Pre visit review using our clinic review tool, if applicable. No additional management support is needed unless otherwise documented below in the visit note. 

## 2015-07-29 NOTE — Progress Notes (Signed)
Subjective:  Patient ID: Vernon Bishop, male    DOB: [birth year]  Age: 75 y.o. MRN: [id number]  CC: No chief complaint on file.   HPI Vernon Bishop presents for LBP, depression f/u. C/o more anxiety C/o CTS B x long time - worse  (Dr Nelva Bush) C/o L hand fingers # 4-5 weak; B wrists hurt   Outpatient Prescriptions Prior to Visit  Medication Sig Dispense Refill  . acetaminophen (TYLENOL) 500 MG tablet Take 2 tablets (1,000 mg total) by mouth every 8 (eight) hours as needed for mild pain. 30 tablet 0  . amLODipine (NORVASC) 10 MG tablet TAKE 1 TABLET BY MOUTH EVERY MORNING. (Patient taking differently: TAKE 1/2 TABLET BY MOUTH EVERY MORNING.) 90 tablet 3  . aspirin EC 81 MG tablet Take 81 mg by mouth daily.    . benazepril-hydrochlorthiazide (LOTENSIN HCT) 20-12.5 MG per tablet TAKE 2 TABLETS BY MOUTH DAILY. 60 tablet 5  . buPROPion (WELLBUTRIN SR) 150 MG 12 hr tablet TAKE 1 TABLET BY MOUTH 2 TIMES DAILY. 60 tablet 5  . Cholecalciferol (VITAMIN D3) 1000 UNIT tablet Take 1,000 Units by mouth daily.      . clonazePAM (KLONOPIN) 0.5 MG tablet Take 1 tablet (0.5 mg total) by mouth 2 (two) times daily as needed for anxiety. Take 1/2 to 1 tab by mouth twice daily as needed for anxiety 30 tablet 1  . clotrimazole-betamethasone (LOTRISONE) cream Apply 1 application topically 2 (two) times daily. 60 g 1  . Cyanocobalamin (VITAMIN B-12 CR) 1000 MCG TBCR Take 1,000 mcg by mouth daily.      Marland Kitchen docusate sodium 100 MG CAPS Take 100 mg by mouth 2 (two) times daily. 10 capsule 0  . ferrous sulfate 325 (65 FE) MG tablet Take 1 tablet (325 mg total) by mouth 3 (three) times daily after meals.  3  . FLUoxetine (PROZAC) 40 MG capsule TAKE 1 CAPSULE BY MOUTH DAILY. 90 capsule 2  . gabapentin (NEURONTIN) 300 MG capsule TAKE 1 CAPSULE BY MOUTH 3 TIMES DAILY. 90 capsule 3  . morphine (MS CONTIN) 30 MG 12 hr tablet Take 1 tablet (30 mg total) by mouth every 12 (twelve) hours. 60 tablet 0  . Multiple Vitamin  (MULTIVITAMIN) tablet Take 1 tablet by mouth daily.    Marland Kitchen oxyCODONE (ROXICODONE) 15 MG immediate release tablet Take 1 tablet (15 mg total) by mouth every 6 (six) hours as needed. Please fill on or after 07/16/15 120 tablet 0  . polyethylene glycol (MIRALAX / GLYCOLAX) packet Take 17 g by mouth 2 (two) times daily. 14 each 0  . propranolol (INDERAL) 80 MG tablet TAKE 1 TABLET BY MOUTH 3 TIMES DAILY. 270 tablet 1  . testosterone cypionate (DEPOTESTOSTERONE CYPIONATE) 200 MG/ML injection INJECT 3 MILLILITERS (600 MG TOTAL) INTO THE MUSCLE EVERY 14 DAYS. 10 mL 3  . tiZANidine (ZANAFLEX) 4 MG tablet TAKE 1 TABLET BY MOUTH EVERY 8 HOURS AS NEEDED FOR MUSCLE SPASMS. 60 tablet 1  . traZODone (DESYREL) 50 MG tablet TAKE 1 TO 2 TABLETS BY MOUTH AT BEDTIME. 60 tablet 5   No facility-administered medications prior to visit.    ROS Review of Systems  Constitutional: Positive for fatigue. Negative for appetite change and unexpected weight change.  HENT: Negative for congestion, nosebleeds, sneezing, sore throat and trouble swallowing.   Eyes: Negative for itching and visual disturbance.  Respiratory: Negative for cough.   Cardiovascular: Negative for chest pain, palpitations and leg swelling.  Gastrointestinal: Negative for nausea, diarrhea, blood  in stool and abdominal distention.  Genitourinary: Negative for frequency and hematuria.  Musculoskeletal: Positive for back pain, arthralgias and gait problem. Negative for joint swelling and neck pain.  Skin: Negative for rash.  Neurological: Negative for dizziness, tremors, speech difficulty and weakness.  Psychiatric/Behavioral: Negative for suicidal ideas, sleep disturbance, dysphoric mood, decreased concentration and agitation. The patient is nervous/anxious.     Objective:  BP 160/70 mmHg  Pulse 48  Wt 227 lb (102.967 kg)  SpO2 96%  BP Readings from Last 3 Encounters:  07/29/15 160/70  05/01/15 140/68  01/28/15 138/60    Wt Readings from Last  3 Encounters:  07/29/15 227 lb (102.967 kg)  05/01/15 225 lb (102.059 kg)  01/28/15 232 lb (105.235 kg)    Physical Exam  Constitutional: He is oriented to person, place, and time. He appears well-developed. No distress.  NAD  HENT:  Mouth/Throat: Oropharynx is clear and moist.  Eyes: Conjunctivae are normal. Pupils are equal, round, and reactive to light.  Neck: Normal range of motion. No JVD present. No thyromegaly present.  Cardiovascular: Normal rate, regular rhythm, normal heart sounds and intact distal pulses.  Exam reveals no gallop and no friction rub.   No murmur heard. Pulmonary/Chest: Effort normal and breath sounds normal. No respiratory distress. He has no wheezes. He has no rales. He exhibits no tenderness.  Abdominal: Soft. Bowel sounds are normal. He exhibits no distension and no mass. There is no tenderness. There is no rebound and no guarding.  Musculoskeletal: Normal range of motion. He exhibits tenderness. He exhibits no edema.  Lymphadenopathy:    He has no cervical adenopathy.  Neurological: He is alert and oriented to person, place, and time. He has normal reflexes. No cranial nerve deficit. He exhibits normal muscle tone. He displays a negative Romberg sign. Coordination and gait normal.  Skin: Skin is warm and dry. No rash noted.  Psychiatric: He has a normal mood and affect. His behavior is normal. Judgment and thought content normal.  L>R wrist tender L thener and hypothenar w/atrophy 5-/5 L grip LS is tender  Lab Results  Component Value Date   WBC 7.4 02/13/2015   HGB 16.8 02/13/2015   HCT 50.5 02/13/2015   PLT 196.0 02/13/2015   GLUCOSE 82 02/13/2015   CHOL 149 02/13/2015   TRIG 146.0 02/13/2015   HDL 26.90* 02/13/2015   LDLCALC 93 02/13/2015   ALT 20 02/13/2015   AST 24 02/13/2015   NA 137 02/13/2015   K 3.7 02/13/2015   CL 99 02/13/2015   CREATININE 0.95 02/13/2015   BUN 18 02/13/2015   CO2 31 02/13/2015   TSH 2.37 02/13/2015   PSA 0.74  02/13/2015   INR 1.00 03/07/2013   HGBA1C 5.1 01/10/2014    Dg Chest 2 View  03/07/2013  CLINICAL DATA:  Preoperative radiograph. Right total hip arthroplasty. EXAM: CHEST  2 VIEW COMPARISON:  CT chest 01/24/2004. FINDINGS: Old granulomatous disease is present. The cardiopericardial silhouette is borderline for projection. Cervical ACDF plate. No airspace disease or effusion. Thoracic spondylosis. Surgical clips in the upper abdomen are likely for cholecystectomy. IMPRESSION: No active cardiopulmonary disease.  Old granulomatous disease. Electronically Signed   By: Dereck Ligas M.D.   On: 03/07/2013 16:23    Assessment & Plan:   There are no diagnoses linked to this encounter. I am having Vernon Bishop maintain his Vitamin B-12 CR, cholecalciferol, DSS, ferrous sulfate, polyethylene glycol, acetaminophen, clotrimazole-betamethasone, clonazePAM, buPROPion, gabapentin, amLODipine, benazepril-hydrochlorthiazide, aspirin EC, multivitamin, FLUoxetine, propranolol,  traZODone, testosterone cypionate, oxyCODONE, morphine, and tiZANidine.  No orders of the defined types were placed in this encounter.     Follow-up: No Follow-up on file.  Walker Kehr, MD

## 2015-07-29 NOTE — Assessment & Plan Note (Signed)
Worsening L Splint F/u w/Dr Nelva Bush Depomedrol 80 mg IM

## 2015-07-29 NOTE — Assessment & Plan Note (Signed)
Dr Nelva Bush Oxycodone, MS-contin, Tizanidine Chronic Potential benefits of a long term opioids use as well as potential risks (i.e. addiction risk, apnea etc) and complications (i.e. Somnolence, constipation and others) were explained to the patient and were aknowledged.

## 2015-07-29 NOTE — Assessment & Plan Note (Signed)
Chronic - worse Clonazepam prn (increase to tid prn) - not to be taken w/opioids  Potential benefits of a long term benzodiazepines  use as well as potential risks  and complications were explained to the patient and were aknowledged.

## 2015-07-29 NOTE — Assessment & Plan Note (Signed)
On B12 

## 2015-07-29 NOTE — Assessment & Plan Note (Signed)
On diet Labs 

## 2015-07-29 NOTE — Assessment & Plan Note (Signed)
Worsening chronic B R>L Splints B F/u w/Dr Nelva Bush Depomedrol 80 mg IM

## 2015-08-16 ENCOUNTER — Other Ambulatory Visit: Payer: Self-pay | Admitting: Internal Medicine

## 2015-09-23 ENCOUNTER — Telehealth: Payer: Self-pay

## 2015-09-23 NOTE — Telephone Encounter (Signed)
PA initiated and APPROVED 09/23/2015 - 09/22/2016 via [place]

## 2015-10-21 ENCOUNTER — Other Ambulatory Visit: Payer: Self-pay | Admitting: Internal Medicine

## 2015-10-22 NOTE — Telephone Encounter (Signed)
Done

## 2015-10-29 ENCOUNTER — Ambulatory Visit (INDEPENDENT_AMBULATORY_CARE_PROVIDER_SITE_OTHER): Payer: Medicare Other | Admitting: Internal Medicine

## 2015-10-29 ENCOUNTER — Encounter: Payer: Self-pay | Admitting: Internal Medicine

## 2015-10-29 VITALS — BP 130/78 | HR 51 | Wt 227.0 lb

## 2015-10-29 DIAGNOSIS — I10 Essential (primary) hypertension: Secondary | ICD-10-CM | POA: Diagnosis not present

## 2015-10-29 DIAGNOSIS — E119 Type 2 diabetes mellitus without complications: Secondary | ICD-10-CM

## 2015-10-29 DIAGNOSIS — M5440 Lumbago with sciatica, unspecified side: Secondary | ICD-10-CM

## 2015-10-29 DIAGNOSIS — E538 Deficiency of other specified B group vitamins: Secondary | ICD-10-CM | POA: Diagnosis not present

## 2015-10-29 DIAGNOSIS — E291 Testicular hypofunction: Secondary | ICD-10-CM | POA: Diagnosis not present

## 2015-10-29 MED ORDER — GABAPENTIN 300 MG PO CAPS: 300.0000 mg | ORAL_CAPSULE | Freq: Three times a day (TID) | ORAL | 3 refills | Status: DC

## 2015-10-29 MED ORDER — MORPHINE SULFATE ER 30 MG PO TBCR: 30.0000 mg | EXTENDED_RELEASE_TABLET | Freq: Two times a day (BID) | ORAL | 0 refills | Status: DC

## 2015-10-29 MED ORDER — OXYCODONE HCL 15 MG PO TABS: 15.0000 mg | ORAL_TABLET | Freq: Four times a day (QID) | ORAL | 0 refills | Status: DC | PRN

## 2015-10-29 MED ORDER — CLOTRIMAZOLE-BETAMETHASONE 1-0.05 % EX CREA: 1.0000 "application " | TOPICAL_CREAM | Freq: Two times a day (BID) | CUTANEOUS | 1 refills | Status: DC

## 2015-10-29 NOTE — Progress Notes (Signed)
Pre visit review using our clinic review tool, if applicable. No additional management support is needed unless otherwise documented below in the visit note. 

## 2015-10-29 NOTE — Assessment & Plan Note (Signed)
Lotensin HCT, Amlodipine, Inderal 

## 2015-10-29 NOTE — Assessment & Plan Note (Signed)
On B12 

## 2015-10-29 NOTE — Progress Notes (Signed)
Subjective:  Patient ID: Vernon Bishop, male    DOB: July 23, 1940  Age: 75 y.o. MRN: [id number]  CC: No chief complaint on file.   HPI Vernon Bishop presents for HTN, LBP, anxiety  Outpatient Medications Prior to Visit  Medication Sig Dispense Refill  . acetaminophen (TYLENOL) 500 MG tablet Take 2 tablets (1,000 mg total) by mouth every 8 (eight) hours as needed for mild pain. 30 tablet 0  . amLODipine (NORVASC) 10 MG tablet TAKE 1 TABLET BY MOUTH EVERY MORNING. (Patient taking differently: TAKE 1/2 TABLET BY MOUTH EVERY MORNING.) 90 tablet 3  . aspirin EC 81 MG tablet Take 81 mg by mouth daily.    . benazepril-hydrochlorthiazide (LOTENSIN HCT) 20-12.5 MG per tablet TAKE 2 TABLETS BY MOUTH DAILY. 60 tablet 5  . buPROPion (WELLBUTRIN SR) 150 MG 12 hr tablet Take 1 tablet (150 mg total) by mouth 2 (two) times daily. 60 tablet 5  . Cholecalciferol (VITAMIN D3) 1000 UNIT tablet Take 1,000 Units by mouth daily.      . clonazePAM (KLONOPIN) 0.5 MG tablet TAKE 1 TABLET BY MOUTH THREE TIMES A DAY AS NEEDED FOR ANXIETY 90 tablet 1  . clotrimazole-betamethasone (LOTRISONE) cream Apply 1 application topically 2 (two) times daily. 60 g 1  . Cyanocobalamin (VITAMIN B-12 CR) 1000 MCG TBCR Take 1,000 mcg by mouth daily.      Marland Kitchen docusate sodium 100 MG CAPS Take 100 mg by mouth 2 (two) times daily. 10 capsule 0  . ferrous sulfate 325 (65 FE) MG tablet Take 1 tablet (325 mg total) by mouth 3 (three) times daily after meals.  3  . FLUoxetine (PROZAC) 40 MG capsule TAKE 1 CAPSULE BY MOUTH DAILY. 90 capsule 2  . gabapentin (NEURONTIN) 300 MG capsule TAKE 1 CAPSULE BY MOUTH 3 TIMES DAILY. 90 capsule 3  . morphine (MS CONTIN) 30 MG 12 hr tablet Take 1 tablet (30 mg total) by mouth every 12 (twelve) hours. 60 tablet 0  . Multiple Vitamin (MULTIVITAMIN) tablet Take 1 tablet by mouth daily.    Marland Kitchen oxyCODONE (ROXICODONE) 15 MG immediate release tablet Take 1 tablet (15 mg total) by mouth every 6 (six) hours as needed  for pain. Please fill on or after 10/15/15 120 tablet 0  . polyethylene glycol (MIRALAX / GLYCOLAX) packet Take 17 g by mouth 2 (two) times daily. 14 each 0  . propranolol (INDERAL) 80 MG tablet TAKE 1 TABLET BY MOUTH 3 TIMES DAILY. 270 tablet 1  . testosterone cypionate (DEPOTESTOSTERONE CYPIONATE) 200 MG/ML injection INJECT 3 MILLILITERS (600 MG TOTAL) INTO THE MUSCLE EVERY 14 DAYS. 10 mL 3  . tiZANidine (ZANAFLEX) 4 MG tablet TAKE 1 TABLET BY MOUTH EVERY 8 HOURS AS NEEDED FOR MUSCLE SPASMS. 60 tablet 1  . traZODone (DESYREL) 50 MG tablet TAKE 1 TO 2 TABLETS BY MOUTH AT BEDTIME. 60 tablet 5   No facility-administered medications prior to visit.     ROS Review of Systems  Constitutional: Positive for fatigue. Negative for appetite change and unexpected weight change.  HENT: Negative for congestion, nosebleeds, sneezing, sore throat and trouble swallowing.   Eyes: Negative for itching and visual disturbance.  Respiratory: Negative for cough.   Cardiovascular: Negative for chest pain, palpitations and leg swelling.  Gastrointestinal: Negative for abdominal distention, blood in stool, diarrhea and nausea.  Genitourinary: Negative for frequency and hematuria.  Musculoskeletal: Positive for arthralgias, back pain and gait problem. Negative for joint swelling and neck pain.  Skin: Negative for rash.  Neurological: Negative for dizziness, tremors, speech difficulty and weakness.  Psychiatric/Behavioral: Negative for agitation, dysphoric mood and sleep disturbance. The patient is not nervous/anxious.     Objective:  Wt 227 lb (103 kg)   BMI 30.79 kg/m   BP Readings from Last 3 Encounters:  07/29/15 (!) 160/70  05/01/15 140/68  01/28/15 138/60    Wt Readings from Last 3 Encounters:  10/29/15 227 lb (103 kg)  07/29/15 227 lb (103 kg)  05/01/15 225 lb (102.1 kg)    Physical Exam  Constitutional: He is oriented to person, place, and time. He appears well-developed. No distress.  NAD    HENT:  Mouth/Throat: Oropharynx is clear and moist.  Eyes: Conjunctivae are normal. Pupils are equal, round, and reactive to light.  Neck: Normal range of motion. No JVD present. No thyromegaly present.  Cardiovascular: Normal rate, regular rhythm, normal heart sounds and intact distal pulses.  Exam reveals no gallop and no friction rub.   No murmur heard. Pulmonary/Chest: Effort normal and breath sounds normal. No respiratory distress. He has no wheezes. He has no rales. He exhibits no tenderness.  Abdominal: Soft. Bowel sounds are normal. He exhibits no distension and no mass. There is no tenderness. There is no rebound and no guarding.  Musculoskeletal: Normal range of motion. He exhibits tenderness. He exhibits no edema.  Lymphadenopathy:    He has no cervical adenopathy.  Neurological: He is alert and oriented to person, place, and time. He has normal reflexes. No cranial nerve deficit. He exhibits normal muscle tone. He displays a negative Romberg sign. Coordination and gait normal.  Skin: Skin is warm and dry. No rash noted.  Psychiatric: He has a normal mood and affect. His behavior is normal. Judgment and thought content normal.  LS tender Obese  Lab Results  Component Value Date   WBC 7.4 02/13/2015   HGB 16.8 02/13/2015   HCT 50.5 02/13/2015   PLT 196.0 02/13/2015   GLUCOSE 82 02/13/2015   CHOL 149 02/13/2015   TRIG 146.0 02/13/2015   HDL 26.90 (L) 02/13/2015   LDLCALC 93 02/13/2015   ALT 20 02/13/2015   AST 24 02/13/2015   NA 137 02/13/2015   K 3.7 02/13/2015   CL 99 02/13/2015   CREATININE 0.95 02/13/2015   BUN 18 02/13/2015   CO2 31 02/13/2015   TSH 2.37 02/13/2015   PSA 0.74 02/13/2015   INR 1.00 03/07/2013   HGBA1C 5.1 01/10/2014    Dg Chest 2 View  Result Date: 03/07/2013 CLINICAL DATA:  Preoperative radiograph. Right total hip arthroplasty. EXAM: CHEST  2 VIEW COMPARISON:  CT chest 01/24/2004. FINDINGS: Old granulomatous disease is present. The  cardiopericardial silhouette is borderline for projection. Cervical ACDF plate. No airspace disease or effusion. Thoracic spondylosis. Surgical clips in the upper abdomen are likely for cholecystectomy. IMPRESSION: No active cardiopulmonary disease.  Old granulomatous disease. Electronically Signed   By: Dereck Ligas M.D.   On: 03/07/2013 16:23    Assessment & Plan:   There are no diagnoses linked to this encounter. I am having Mr. Carlota Raspberry maintain his Vitamin B-12 CR, cholecalciferol, DSS, ferrous sulfate, polyethylene glycol, acetaminophen, clotrimazole-betamethasone, amLODipine, benazepril-hydrochlorthiazide, aspirin EC, multivitamin, FLUoxetine, propranolol, traZODone, testosterone cypionate, tiZANidine, buPROPion, oxyCODONE, morphine, gabapentin, and clonazePAM.  No orders of the defined types were placed in this encounter.    Follow-up: No Follow-up on file.  Walker Kehr, MD

## 2015-10-29 NOTE — Assessment & Plan Note (Signed)
Chronic On Testosterone inj  Potential benefits of a long term testosterone  use as well as potential risks  and complications were explained to the patient and were aknowledged.

## 2015-10-29 NOTE — Assessment & Plan Note (Signed)
Oxycodone, MS-contin, Tizanidine Chronic Potential benefits of a long term opioids use as well as potential risks (i.e. addiction risk, apnea etc) and complications (i.e. Somnolence, constipation and others) were explained to the patient and were aknowledged.   

## 2015-10-29 NOTE — Assessment & Plan Note (Signed)
Wt Readings from Last 3 Encounters:  10/29/15 227 lb (103 kg)  07/29/15 227 lb (103 kg)  05/01/15 225 lb (102.1 kg)  On diet, keep the wt down

## 2015-11-26 ENCOUNTER — Telehealth: Payer: Self-pay | Admitting: *Deleted

## 2015-11-26 NOTE — Telephone Encounter (Signed)
OK to fill this prescription with additional refills x5 Thank you!  

## 2015-11-26 NOTE — Telephone Encounter (Signed)
Rec'd fax pt requesting refill on hsi Testosterone. Last filled 09/24/15...Vernon Bishop

## 2015-11-27 MED ORDER — TESTOSTERONE CYPIONATE 200 MG/ML IM SOLN: INTRAMUSCULAR | 5 refills | Status: DC

## 2015-11-27 NOTE — Telephone Encounter (Signed)
Called refill into pharmacy had to leave on pharmacy vm.../LMB

## 2015-12-09 ENCOUNTER — Ambulatory Visit: Payer: Medicare Other | Admitting: Adult Health

## 2015-12-10 ENCOUNTER — Encounter: Payer: Self-pay | Admitting: Adult Health

## 2015-12-10 ENCOUNTER — Ambulatory Visit (INDEPENDENT_AMBULATORY_CARE_PROVIDER_SITE_OTHER): Payer: Medicare Other | Admitting: Adult Health

## 2015-12-10 DIAGNOSIS — G4733 Obstructive sleep apnea (adult) (pediatric): Secondary | ICD-10-CM

## 2015-12-10 DIAGNOSIS — Z9989 Dependence on other enabling machines and devices: Principal | ICD-10-CM

## 2015-12-10 NOTE — Assessment & Plan Note (Signed)
Severe OSA -well controlled on CPAP   Plan  Patient Instructions  Continue on CPAP At bedtime   Work on weight loss.  Do not drive if sleepy.  Follow up with Dr. Elsworth Soho in 1 year and As needed

## 2015-12-10 NOTE — Progress Notes (Signed)
Subjective:    Patient ID: Vernon Bishop, male    DOB: [birth year], 75 y.o.   MRN: [id number]  HPI 75 year old male with known severe sleep apnea   TEST  NPSG 2002:  AHI 80/hr.  CPAP 12cm optimal  Auto 2013:  Optimal pressure 12cm.  Pt has chosen to stay on auto setting Download 07/2014 - AHi 14/h, centrals 6/h, good usage Changed to 12 cm, feels refreshed, no naps  12/10/2015 follow-up sleep apnea Returns for a one-year follow-up for obstructive sleep apnea. Patient has known severe sleep apnea. He is doing well on C Pap at bedtime. He is on a set pressure of 12 cm H2O. Download shows excellent compliance with average usage at 7-1/2 hours. AHI 4.1. Positive leaks. Leaks not bothering him. Sleeps on side some .  He says he is doing well. Has no significant daytime sleepiness. Denies chest pain, orthopnea, edema.    Past Medical History:  Diagnosis Date  . Atrial fibrillation ([place])    1992  . DDD (degenerative disc disease)   . DDD (degenerative disc disease)   . Depression   . Diabetes mellitus    type II  - PT STATES NOT ON ANY DIABETIC MEDICINES  . ED (erectile dysfunction)   . Hyperlipidemia   . Hypertension   . Hypogonadism male   . LBP (low back pain)   . MVP (mitral valve prolapse)   . OSA on CPAP   . Osteoarthritis   . Osteoarthritis, hand    SEVERE LOWER BACK PAIN, AND RESTLESS LEG SYNDROME  . Palpitations   . PTSD (post-traumatic stress disorder)   . Swallowing problem    HX OF PILLS GETTING "STUCK" IN THROAT AT TIMES  . Vitamin D deficiency    Current Outpatient Prescriptions on File Prior to Visit  Medication Sig Dispense Refill  . acetaminophen (TYLENOL) 500 MG tablet Take 2 tablets (1,000 mg total) by mouth every 8 (eight) hours as needed for mild pain. 30 tablet 0  . amLODipine (NORVASC) 10 MG tablet TAKE 1 TABLET BY MOUTH EVERY MORNING. (Patient taking differently: TAKE 1/2 TABLET BY MOUTH EVERY MORNING.) 90 tablet 3  . aspirin EC 81 MG tablet Take  81 mg by mouth daily.    . benazepril-hydrochlorthiazide (LOTENSIN HCT) 20-12.5 MG per tablet TAKE 2 TABLETS BY MOUTH DAILY. 60 tablet 5  . buPROPion (WELLBUTRIN SR) 150 MG 12 hr tablet Take 1 tablet (150 mg total) by mouth 2 (two) times daily. 60 tablet 5  . Cholecalciferol (VITAMIN D3) 1000 UNIT tablet Take 1,000 Units by mouth daily.      . clonazePAM (KLONOPIN) 0.5 MG tablet TAKE 1 TABLET BY MOUTH THREE TIMES A DAY AS NEEDED FOR ANXIETY 90 tablet 1  . clotrimazole-betamethasone (LOTRISONE) cream Apply 1 application topically 2 (two) times daily. 60 g 1  . Cyanocobalamin (VITAMIN B-12 CR) 1000 MCG TBCR Take 1,000 mcg by mouth daily.      Marland Kitchen docusate sodium 100 MG CAPS Take 100 mg by mouth 2 (two) times daily. 10 capsule 0  . ferrous sulfate 325 (65 FE) MG tablet Take 1 tablet (325 mg total) by mouth 3 (three) times daily after meals.  3  . FLUoxetine (PROZAC) 40 MG capsule TAKE 1 CAPSULE BY MOUTH DAILY. 90 capsule 2  . gabapentin (NEURONTIN) 300 MG capsule Take 1 capsule (300 mg total) by mouth 3 (three) times daily. 90 capsule 3  . morphine (MS CONTIN) 30 MG 12 hr tablet Take 1 tablet (  30 mg total) by mouth every 12 (twelve) hours. 60 tablet 0  . Multiple Vitamin (MULTIVITAMIN) tablet Take 1 tablet by mouth daily.    Marland Kitchen oxyCODONE (ROXICODONE) 15 MG immediate release tablet Take 1 tablet (15 mg total) by mouth every 6 (six) hours as needed for pain. Please fill on or after 01/15/16 120 tablet 0  . polyethylene glycol (MIRALAX / GLYCOLAX) packet Take 17 g by mouth 2 (two) times daily. 14 each 0  . propranolol (INDERAL) 80 MG tablet TAKE 1 TABLET BY MOUTH 3 TIMES DAILY. 270 tablet 1  . testosterone cypionate (DEPOTESTOSTERONE CYPIONATE) 200 MG/ML injection INJECT 3 MILLILITERS (600 MG TOTAL) INTO THE MUSCLE EVERY 14 DAYS. 10 mL 5  . tiZANidine (ZANAFLEX) 4 MG tablet TAKE 1 TABLET BY MOUTH EVERY 8 HOURS AS NEEDED FOR MUSCLE SPASMS. 60 tablet 1  . traZODone (DESYREL) 50 MG tablet TAKE 1 TO 2 TABLETS  BY MOUTH AT BEDTIME. 60 tablet 5   No current facility-administered medications on file prior to visit.     Review of Systems Constitutional:   No  weight loss, night sweats,  Fevers, chills, fatigue, or  lassitude.  HEENT:   No headaches,  Difficulty swallowing,  Tooth/dental problems, or  Sore throat,                No sneezing, itching, ear ache, nasal congestion, post nasal drip,   CV:  No chest pain,  Orthopnea, PND, swelling in lower extremities, anasarca, dizziness, palpitations, syncope.   GI  No heartburn, indigestion, abdominal pain, nausea, vomiting, diarrhea, change in bowel habits, loss of appetite, bloody stools.   Resp: No shortness of breath with exertion or at rest.  No excess mucus, no productive cough,  No non-productive cough,  No coughing up of blood.  No change in color of mucus.  No wheezing.  No chest wall deformity  Skin: no rash or lesions.  GU: no dysuria, change in color of urine, no urgency or frequency.  No flank pain, no hematuria   MS:  No joint pain or swelling.  No decreased range of motion.  No back pain.  Psych:  No change in mood or affect. No depression or anxiety.  No memory loss.         Objective:   Physical Exam Vitals:   12/10/15 1425  BP: 136/74  Pulse: (!) 53  Temp: 98.1 F (36.7 C)  TempSrc: Oral  SpO2: 97%  Weight: 228 lb (103.4 kg)  Height: 6' (1.829 m)   Body mass index is 30.92 kg/m.   GEN: A/Ox3; pleasant , NAD, well nourished    HEENT:  /AT,  EACs-clear, TMs-wnl, NOSE-clear, THROAT-clear, no lesions, no postnasal drip or exudate noted. Class 3 MP airway   NECK:  Supple w/ fair ROM; no JVD; normal carotid impulses w/o bruits; no thyromegaly or nodules palpated; no lymphadenopathy.    RESP  Clear  P & A; w/o, wheezes/ rales/ or rhonchi. no accessory muscle use, no dullness to percussion  CARD:  RRR, no m/r/g  , no peripheral edema, pulses intact, no cyanosis or clubbing.  GI:   Soft & nt; nml bowel sounds; no  organomegaly or masses detected.   Musco: Warm bil, no deformities or joint swelling noted.   Neuro: alert, no focal deficits noted.    Skin: Warm, no lesions or rashes  Sahalie Beth NP-C  [facility]  12/10/2015        Assessment & Plan:

## 2015-12-10 NOTE — Patient Instructions (Signed)
Continue on CPAP At bedtime   °Work on weight loss.  °Do not drive if sleepy.  °Follow up with Dr. Alva in 1 year and As needed   ° ° °

## 2015-12-14 NOTE — Progress Notes (Signed)
Reviewed & agree with plan  

## 2015-12-31 ENCOUNTER — Other Ambulatory Visit: Payer: Self-pay | Admitting: Internal Medicine

## 2016-01-01 NOTE — Telephone Encounter (Signed)
Refill called into pharmacy spoke w/Stephanie gave MD approval.../lmb

## 2016-01-27 ENCOUNTER — Other Ambulatory Visit: Payer: Self-pay | Admitting: Internal Medicine

## 2016-02-03 ENCOUNTER — Encounter: Payer: Self-pay | Admitting: Internal Medicine

## 2016-02-03 ENCOUNTER — Other Ambulatory Visit (INDEPENDENT_AMBULATORY_CARE_PROVIDER_SITE_OTHER): Payer: Medicare Other

## 2016-02-03 ENCOUNTER — Ambulatory Visit (INDEPENDENT_AMBULATORY_CARE_PROVIDER_SITE_OTHER): Payer: Medicare Other | Admitting: Internal Medicine

## 2016-02-03 VITALS — BP 139/80 | HR 57 | Temp 98.7°F | Wt 224.0 lb

## 2016-02-03 DIAGNOSIS — I10 Essential (primary) hypertension: Secondary | ICD-10-CM | POA: Diagnosis not present

## 2016-02-03 DIAGNOSIS — Z23 Encounter for immunization: Secondary | ICD-10-CM

## 2016-02-03 DIAGNOSIS — E119 Type 2 diabetes mellitus without complications: Secondary | ICD-10-CM

## 2016-02-03 DIAGNOSIS — E538 Deficiency of other specified B group vitamins: Secondary | ICD-10-CM | POA: Diagnosis not present

## 2016-02-03 DIAGNOSIS — M5440 Lumbago with sciatica, unspecified side: Secondary | ICD-10-CM | POA: Diagnosis not present

## 2016-02-03 DIAGNOSIS — Z0000 Encounter for general adult medical examination without abnormal findings: Secondary | ICD-10-CM | POA: Diagnosis not present

## 2016-02-03 DIAGNOSIS — E291 Testicular hypofunction: Secondary | ICD-10-CM | POA: Diagnosis not present

## 2016-02-03 LAB — BASIC METABOLIC PANEL
BUN: 14 mg/dL (ref 6–23)
CALCIUM: 9.3 mg/dL (ref 8.4–10.5)
CHLORIDE: 104 meq/L (ref 96–112)
CO2: 28 mEq/L (ref 19–32)
CREATININE: 0.89 mg/dL (ref 0.40–1.50)
GFR: 88.53 mL/min (ref >60.00)
Glucose, Bld: 74 mg/dL (ref 70–99)
Potassium: 3.8 mEq/L (ref 3.5–5.1)
SODIUM: 139 meq/L (ref 135–145)

## 2016-02-03 LAB — HEPATIC FUNCTION PANEL
ALK PHOS: 72 U/L (ref 39–117)
ALT: 15 U/L (ref 0–53)
AST: 15 U/L (ref 0–37)
Albumin: 4.2 g/dL (ref 3.5–5.2)
BILIRUBIN DIRECT: 0.1 mg/dL (ref 0.0–0.3)
Total Bilirubin: 0.6 mg/dL (ref 0.2–1.2)
Total Protein: 6.4 g/dL (ref 6.0–8.3)

## 2016-02-03 LAB — PSA: PSA: 0.81 ng/mL (ref 0.10–4.00)

## 2016-02-03 LAB — CBC WITH DIFFERENTIAL/PLATELET
BASOS ABS: 0 10*3/uL (ref 0.0–0.1)
Basophils Relative: 0.3 % (ref 0.0–3.0)
Eosinophils Absolute: 0.1 10*3/uL (ref 0.0–0.7)
Eosinophils Relative: 0.7 % (ref 0.0–5.0)
HCT: 47.8 % (ref 39.0–52.0)
Hemoglobin: 16.3 g/dL (ref 13.0–17.0)
LYMPHS ABS: 1.5 10*3/uL (ref 0.7–4.0)
Lymphocytes Relative: 19.4 % (ref 12.0–46.0)
MCHC: 34 g/dL (ref 30.0–36.0)
MCV: 90.8 fl (ref 78.0–100.0)
MONO ABS: 0.7 10*3/uL (ref 0.1–1.0)
MONOS PCT: 9.5 % (ref 3.0–12.0)
NEUTROS ABS: 5.4 10*3/uL (ref 1.4–7.7)
NEUTROS PCT: 70.1 % (ref 43.0–77.0)
PLATELETS: 218 10*3/uL (ref 150.0–400.0)
RBC: 5.26 Mil/uL (ref 4.22–5.81)
RDW: 13.9 % (ref 11.5–15.5)
WBC: 7.7 10*3/uL (ref 4.0–10.5)

## 2016-02-03 LAB — LIPID PANEL
CHOLESTEROL: 136 mg/dL (ref 0–200)
HDL: 31.6 mg/dL — ABNORMAL LOW (ref >39.00)
LDL Cholesterol: 78 mg/dL (ref 0–99)
NonHDL: 104.34
Total CHOL/HDL Ratio: 4
Triglycerides: 133 mg/dL (ref 0.0–149.0)
VLDL: 26.6 mg/dL (ref 0.0–40.0)

## 2016-02-03 LAB — URINALYSIS, ROUTINE W REFLEX MICROSCOPIC
BILIRUBIN URINE: NEGATIVE
HGB URINE DIPSTICK: NEGATIVE
Ketones, ur: NEGATIVE
LEUKOCYTES UA: NEGATIVE
NITRITE: NEGATIVE
Specific Gravity, Urine: 1.01 (ref 1.000–1.030)
Total Protein, Urine: 30 — AB
UROBILINOGEN UA: 0.2 (ref 0.0–1.0)
Urine Glucose: NEGATIVE
pH: 6.5 (ref 5.0–8.0)

## 2016-02-03 LAB — TSH: TSH: 1.36 u[IU]/mL (ref 0.35–4.50)

## 2016-02-03 LAB — TESTOSTERONE: Testosterone: 423.77 ng/dL (ref 300.00–890.00)

## 2016-02-03 LAB — VITAMIN B12: VITAMIN B 12: 616 pg/mL (ref 211–911)

## 2016-02-03 MED ORDER — OXYCODONE HCL 15 MG PO TABS: 15.0000 mg | ORAL_TABLET | Freq: Four times a day (QID) | ORAL | 0 refills | Status: DC | PRN

## 2016-02-03 MED ORDER — CLONAZEPAM 0.5 MG PO TABS: 0.5000 mg | ORAL_TABLET | Freq: Three times a day (TID) | ORAL | 2 refills | Status: DC | PRN

## 2016-02-03 MED ORDER — TESTOSTERONE CYPIONATE 200 MG/ML IM SOLN: INTRAMUSCULAR | 5 refills | Status: DC

## 2016-02-03 MED ORDER — MORPHINE SULFATE ER 30 MG PO TBCR: 30.0000 mg | EXTENDED_RELEASE_TABLET | Freq: Two times a day (BID) | ORAL | 0 refills | Status: DC

## 2016-02-03 NOTE — Assessment & Plan Note (Signed)
  On diet  

## 2016-02-03 NOTE — Progress Notes (Signed)
Pre visit review using our clinic review tool, if applicable. No additional management support is needed unless otherwise documented below in the visit note. 

## 2016-02-03 NOTE — Patient Instructions (Signed)

## 2016-02-03 NOTE — Progress Notes (Signed)
Subjective:  Patient ID: Vernon Bishop, male    DOB: [birth year]  Age: 75 y.o. MRN: [id number]  CC: No chief complaint on file.   HPI Vernon Bishop presents for a well exam F/u LBP,  HTN, hypogonadism f/u  Outpatient Medications Prior to Visit  Medication Sig Dispense Refill  . acetaminophen (TYLENOL) 500 MG tablet Take 2 tablets (1,000 mg total) by mouth every 8 (eight) hours as needed for mild pain. 30 tablet 0  . amLODipine (NORVASC) 10 MG tablet TAKE 1 TABLET BY MOUTH EVERY MORNING. (Patient taking differently: TAKE 1/2 TABLET BY MOUTH EVERY MORNING.) 90 tablet 3  . aspirin EC 81 MG tablet Take 81 mg by mouth daily.    . benazepril-hydrochlorthiazide (LOTENSIN HCT) 20-12.5 MG per tablet TAKE 2 TABLETS BY MOUTH DAILY. 60 tablet 5  . buPROPion (WELLBUTRIN SR) 150 MG 12 hr tablet Take 1 tablet (150 mg total) by mouth 2 (two) times daily. 60 tablet 5  . Cholecalciferol (VITAMIN D3) 1000 UNIT tablet Take 1,000 Units by mouth daily.      . clonazePAM (KLONOPIN) 0.5 MG tablet TAKE 1 TABLET BY MOUTH 3 TIMES A DAY AS NEEDED. 90 tablet 1  . clotrimazole-betamethasone (LOTRISONE) cream Apply 1 application topically 2 (two) times daily. 60 g 1  . Cyanocobalamin (VITAMIN B-12 CR) 1000 MCG TBCR Take 1,000 mcg by mouth daily.      Marland Kitchen docusate sodium 100 MG CAPS Take 100 mg by mouth 2 (two) times daily. 10 capsule 0  . ferrous sulfate 325 (65 FE) MG tablet Take 1 tablet (325 mg total) by mouth 3 (three) times daily after meals.  3  . FLUoxetine (PROZAC) 40 MG capsule TAKE 1 CAPSULE BY MOUTH DAILY. 90 capsule 2  . gabapentin (NEURONTIN) 300 MG capsule Take 1 capsule (300 mg total) by mouth 3 (three) times daily. 90 capsule 3  . morphine (MS CONTIN) 30 MG 12 hr tablet Take 1 tablet (30 mg total) by mouth every 12 (twelve) hours. 60 tablet 0  . Multiple Vitamin (MULTIVITAMIN) tablet Take 1 tablet by mouth daily.    Marland Kitchen oxyCODONE (ROXICODONE) 15 MG immediate release tablet Take 1 tablet (15 mg total) by  mouth every 6 (six) hours as needed for pain. Please fill on or after 01/15/16 120 tablet 0  . polyethylene glycol (MIRALAX / GLYCOLAX) packet Take 17 g by mouth 2 (two) times daily. 14 each 0  . propranolol (INDERAL) 80 MG tablet TAKE 1 TABLET BY MOUTH 3 TIMES DAILY. 270 tablet 1  . testosterone cypionate (DEPOTESTOSTERONE CYPIONATE) 200 MG/ML injection INJECT 3 MILLILITERS (600 MG TOTAL) INTO THE MUSCLE EVERY 14 DAYS. 10 mL 5  . tiZANidine (ZANAFLEX) 4 MG tablet TAKE 1 TABLET BY MOUTH EVERY 8 HOURS AS NEEDED FOR MUSCLE SPASMS. 60 tablet 1  . traZODone (DESYREL) 50 MG tablet TAKE 1 TO 2 TABLETS BY MOUTH AT BEDTIME. 60 tablet 5   No facility-administered medications prior to visit.     ROS Review of Systems  Constitutional: Positive for fatigue. Negative for appetite change and unexpected weight change.  HENT: Negative for congestion, nosebleeds, sneezing, sore throat and trouble swallowing.   Eyes: Negative for itching and visual disturbance.  Respiratory: Negative for cough.   Cardiovascular: Negative for chest pain, palpitations and leg swelling.  Gastrointestinal: Negative for abdominal distention, blood in stool, diarrhea and nausea.  Genitourinary: Negative for frequency and hematuria.  Musculoskeletal: Positive for arthralgias and back pain. Negative for gait problem, joint swelling  and neck pain.  Skin: Negative for rash.  Neurological: Negative for dizziness, tremors, speech difficulty and weakness.  Psychiatric/Behavioral: Positive for dysphoric mood. Negative for agitation, sleep disturbance and suicidal ideas. The patient is nervous/anxious.     Objective:  BP (!) 180/92   Pulse (!) 57   Temp 98.7 F (37.1 C) (Oral)   Wt 224 lb (101.6 kg)   SpO2 97%   BMI 30.38 kg/m   BP Readings from Last 3 Encounters:  02/03/16 (!) 180/92  12/10/15 136/74  10/29/15 130/78    Wt Readings from Last 3 Encounters:  02/03/16 224 lb (101.6 kg)  12/10/15 228 lb (103.4 kg)  10/29/15  227 lb (103 kg)    Physical Exam  Constitutional: He is oriented to person, place, and time. He appears well-developed. No distress.  NAD  HENT:  Mouth/Throat: Oropharynx is clear and moist.  Eyes: Conjunctivae are normal. Pupils are equal, round, and reactive to light.  Neck: Normal range of motion. No JVD present. No thyromegaly present.  Cardiovascular: Normal rate, regular rhythm, normal heart sounds and intact distal pulses.  Exam reveals no gallop and no friction rub.   No murmur heard. Pulmonary/Chest: Effort normal and breath sounds normal. No respiratory distress. He has no wheezes. He has no rales. He exhibits no tenderness.  Abdominal: Soft. Bowel sounds are normal. He exhibits no distension and no mass. There is no tenderness. There is no rebound and no guarding.  Genitourinary: Rectum normal.  Musculoskeletal: Normal range of motion. He exhibits no edema or tenderness.  Lymphadenopathy:    He has no cervical adenopathy.  Neurological: He is alert and oriented to person, place, and time. He has normal reflexes. No cranial nerve deficit. He exhibits normal muscle tone. He displays a negative Romberg sign. Coordination and gait normal.  Skin: Skin is warm and dry. No rash noted.  Psychiatric: He has a normal mood and affect. His behavior is normal. Judgment and thought content normal.  prostate 1+ LS tender  Lab Results  Component Value Date   WBC 7.4 02/13/2015   HGB 16.8 02/13/2015   HCT 50.5 02/13/2015   PLT 196.0 02/13/2015   GLUCOSE 82 02/13/2015   CHOL 149 02/13/2015   TRIG 146.0 02/13/2015   HDL 26.90 (L) 02/13/2015   LDLCALC 93 02/13/2015   ALT 20 02/13/2015   AST 24 02/13/2015   NA 137 02/13/2015   K 3.7 02/13/2015   CL 99 02/13/2015   CREATININE 0.95 02/13/2015   BUN 18 02/13/2015   CO2 31 02/13/2015   TSH 2.37 02/13/2015   PSA 0.74 02/13/2015   INR 1.00 03/07/2013   HGBA1C 5.1 01/10/2014    Dg Chest 2 View  Result Date: 03/07/2013 CLINICAL DATA:   Preoperative radiograph. Right total hip arthroplasty. EXAM: CHEST  2 VIEW COMPARISON:  CT chest 01/24/2004. FINDINGS: Old granulomatous disease is present. The cardiopericardial silhouette is borderline for projection. Cervical ACDF plate. No airspace disease or effusion. Thoracic spondylosis. Surgical clips in the upper abdomen are likely for cholecystectomy. IMPRESSION: No active cardiopulmonary disease.  Old granulomatous disease. Electronically Signed   By: Dereck Ligas M.D.   On: 03/07/2013 16:23    Assessment & Plan:   There are no diagnoses linked to this encounter. I am having Mr. Carlota Raspberry maintain his Vitamin B-12 CR, cholecalciferol, DSS, ferrous sulfate, polyethylene glycol, acetaminophen, amLODipine, benazepril-hydrochlorthiazide, aspirin EC, multivitamin, FLUoxetine, propranolol, traZODone, buPROPion, gabapentin, clotrimazole-betamethasone, oxyCODONE, morphine, testosterone cypionate, clonazePAM, and tiZANidine.  No orders of the  defined types were placed in this encounter.    Follow-up: No Follow-up on file.  Walker Kehr, MD

## 2016-02-03 NOTE — Assessment & Plan Note (Signed)
Lotensin HCT, Amlodipine, Inderal 

## 2016-02-03 NOTE — Assessment & Plan Note (Signed)

## 2016-02-03 NOTE — Assessment & Plan Note (Signed)
Labs pending.  

## 2016-03-04 ENCOUNTER — Other Ambulatory Visit: Payer: Self-pay | Admitting: Internal Medicine

## 2016-04-03 ENCOUNTER — Other Ambulatory Visit: Payer: Self-pay | Admitting: Internal Medicine

## 2016-04-10 ENCOUNTER — Other Ambulatory Visit: Payer: Self-pay | Admitting: Internal Medicine

## 2016-05-06 ENCOUNTER — Ambulatory Visit (HOSPITAL_COMMUNITY)
Admission: RE | Admit: 2016-05-06 | Discharge: 2016-05-06 | Disposition: A | Discharge status: Home / Self Care | Payer: PPO | Attending: Psychiatry | Admitting: Psychiatry

## 2016-05-06 ENCOUNTER — Ambulatory Visit (INDEPENDENT_AMBULATORY_CARE_PROVIDER_SITE_OTHER): Payer: PPO | Admitting: Internal Medicine

## 2016-05-06 ENCOUNTER — Encounter: Payer: Self-pay | Admitting: Internal Medicine

## 2016-05-06 ENCOUNTER — Ambulatory Visit (INDEPENDENT_AMBULATORY_CARE_PROVIDER_SITE_OTHER)
Admission: RE | Admit: 2016-05-06 | Discharge: 2016-05-06 | Disposition: A | Discharge status: Home / Self Care | Payer: PPO | Source: Ambulatory Visit | Attending: Internal Medicine | Admitting: Internal Medicine

## 2016-05-06 ENCOUNTER — Encounter (HOSPITAL_COMMUNITY): Payer: Self-pay | Admitting: Behavioral Health

## 2016-05-06 VITALS — BP 140/78 | HR 58 | Temp 98.2°F | Resp 20 | Wt 232.8 lb

## 2016-05-06 DIAGNOSIS — G8929 Other chronic pain: Secondary | ICD-10-CM | POA: Diagnosis not present

## 2016-05-06 DIAGNOSIS — E538 Deficiency of other specified B group vitamins: Secondary | ICD-10-CM

## 2016-05-06 DIAGNOSIS — I480 Paroxysmal atrial fibrillation: Secondary | ICD-10-CM | POA: Diagnosis not present

## 2016-05-06 DIAGNOSIS — I10 Essential (primary) hypertension: Secondary | ICD-10-CM | POA: Diagnosis not present

## 2016-05-06 DIAGNOSIS — M25552 Pain in left hip: Secondary | ICD-10-CM | POA: Diagnosis not present

## 2016-05-06 DIAGNOSIS — M5440 Lumbago with sciatica, unspecified side: Secondary | ICD-10-CM

## 2016-05-06 DIAGNOSIS — E119 Type 2 diabetes mellitus without complications: Secondary | ICD-10-CM

## 2016-05-06 DIAGNOSIS — Z79891 Long term (current) use of opiate analgesic: Secondary | ICD-10-CM | POA: Diagnosis not present

## 2016-05-06 DIAGNOSIS — F22 Delusional disorders: Secondary | ICD-10-CM

## 2016-05-06 DIAGNOSIS — Z79899 Other long term (current) drug therapy: Secondary | ICD-10-CM | POA: Diagnosis not present

## 2016-05-06 DIAGNOSIS — S79912A Unspecified injury of left hip, initial encounter: Secondary | ICD-10-CM | POA: Diagnosis not present

## 2016-05-06 MED ORDER — OXYCODONE HCL 15 MG PO TABS: 15.0000 mg | ORAL_TABLET | Freq: Four times a day (QID) | ORAL | 0 refills | Status: DC | PRN

## 2016-05-06 MED ORDER — MORPHINE SULFATE ER 30 MG PO TBCR: 30.0000 mg | EXTENDED_RELEASE_TABLET | Freq: Two times a day (BID) | ORAL | 0 refills | Status: DC

## 2016-05-06 NOTE — Assessment & Plan Note (Signed)
Labs

## 2016-05-06 NOTE — Progress Notes (Signed)
Subjective:  Patient ID: Vernon Bishop, male    DOB: 1941-02-15  Age: 76 y.o. MRN: [id number]  CC: No chief complaint on file.   HPI Vernon Bishop presents for LBP, depression, HTN f/u. Pt fell in the kitchen 2 mo ago and hurt the L hip. Per wife (mentioned in the end of the visit) - Vernon Bishop is delusional. Not violent  Outpatient Medications Prior to Visit  Medication Sig Dispense Refill  . acetaminophen (TYLENOL) 500 MG tablet Take 2 tablets (1,000 mg total) by mouth every 8 (eight) hours as needed for mild pain. 30 tablet 0  . amLODipine (NORVASC) 10 MG tablet TAKE 1 TABLET BY MOUTH EVERY MORNING. 90 tablet 3  . aspirin EC 81 MG tablet Take 81 mg by mouth daily.    . benazepril-hydrochlorthiazide (LOTENSIN HCT) 20-12.5 MG per tablet TAKE 2 TABLETS BY MOUTH DAILY. 60 tablet 5  . buPROPion (WELLBUTRIN SR) 150 MG 12 hr tablet Take 1 tablet (150 mg total) by mouth 2 (two) times daily. 60 tablet 5  . Cholecalciferol (VITAMIN D3) 1000 UNIT tablet Take 1,000 Units by mouth daily.      . clonazePAM (KLONOPIN) 0.5 MG tablet Take 1 tablet (0.5 mg total) by mouth 3 (three) times daily as needed. 90 tablet 2  . clotrimazole-betamethasone (LOTRISONE) cream Apply 1 application topically 2 (two) times daily. 60 g 1  . Cyanocobalamin (VITAMIN B-12 CR) 1000 MCG TBCR Take 1,000 mcg by mouth daily.      Marland Kitchen docusate sodium 100 MG CAPS Take 100 mg by mouth 2 (two) times daily. 10 capsule 0  . ferrous sulfate 325 (65 FE) MG tablet Take 1 tablet (325 mg total) by mouth 3 (three) times daily after meals.  3  . FLUoxetine (PROZAC) 40 MG capsule TAKE 1 CAPSULE BY MOUTH DAILY. 90 capsule 2  . gabapentin (NEURONTIN) 300 MG capsule Take 1 capsule (300 mg total) by mouth 3 (three) times daily. 90 capsule 3  . morphine (MS CONTIN) 30 MG 12 hr tablet Take 1 tablet (30 mg total) by mouth every 12 (twelve) hours. 60 tablet 0  . Multiple Vitamin (MULTIVITAMIN) tablet Take 1 tablet by mouth daily.    Marland Kitchen oxyCODONE  (ROXICODONE) 15 MG immediate release tablet Take 1 tablet (15 mg total) by mouth every 6 (six) hours as needed for pain. Please fill on or after 04/16/16 120 tablet 0  . polyethylene glycol (MIRALAX / GLYCOLAX) packet Take 17 g by mouth 2 (two) times daily. 14 each 0  . propranolol (INDERAL) 80 MG tablet TAKE 1 TABLET BY MOUTH 3 TIMES DAILY. 270 tablet 1  . propranolol (INDERAL) 80 MG tablet TAKE 1 TABLET BY MOUTH 3 TIMES DAILY. 270 tablet 1  . testosterone cypionate (DEPOTESTOSTERONE CYPIONATE) 200 MG/ML injection INJECT 3 MILLILITERS (600 MG TOTAL) INTO THE MUSCLE EVERY 14 DAYS. 10 mL 5  . tiZANidine (ZANAFLEX) 4 MG tablet TAKE 1 TABLET BY MOUTH EVERY 8 HOURS AS NEEDED FOR MUSCLE SPASMS. 60 tablet 1  . traZODone (DESYREL) 50 MG tablet TAKE 1 TO 2 TABLETS BY MOUTH AT BEDTIME. 60 tablet 5   No facility-administered medications prior to visit.     ROS Review of Systems  Constitutional: Negative for appetite change, fatigue and unexpected weight change.  HENT: Negative for congestion, nosebleeds, sneezing, sore throat and trouble swallowing.   Eyes: Negative for itching and visual disturbance.  Respiratory: Negative for cough.   Cardiovascular: Negative for chest pain, palpitations and leg swelling.  Gastrointestinal: Negative for abdominal distention, blood in stool, diarrhea and nausea.  Genitourinary: Negative for frequency and hematuria.  Musculoskeletal: Positive for arthralgias, back pain and gait problem. Negative for joint swelling and neck pain.  Skin: Negative for color change and rash.  Neurological: Positive for weakness. Negative for dizziness, tremors and speech difficulty.  Psychiatric/Behavioral: Negative for agitation, dysphoric mood, sleep disturbance and suicidal ideas. The patient is nervous/anxious.   Not homicidal  Objective:  BP 140/78   Pulse (!) 58   Temp 98.2 F (36.8 C) (Oral)   Resp 20   Wt 232 lb 12 oz (105.6 kg)   SpO2 96%   BMI 31.57 kg/m   BP Readings  from Last 3 Encounters:  05/06/16 140/78  02/03/16 139/80  12/10/15 136/74    Wt Readings from Last 3 Encounters:  05/06/16 232 lb 12 oz (105.6 kg)  02/03/16 224 lb (101.6 kg)  12/10/15 228 lb (103.4 kg)    Physical Exam  Constitutional: He is oriented to person, place, and time. He appears well-developed. No distress.  NAD  HENT:  Mouth/Throat: Oropharynx is clear and moist.  Eyes: Conjunctivae are normal. Pupils are equal, round, and reactive to light.  Neck: Normal range of motion. No JVD present. No thyromegaly present.  Cardiovascular: Normal rate, regular rhythm, normal heart sounds and intact distal pulses.  Exam reveals no gallop and no friction rub.   No murmur heard. Pulmonary/Chest: Effort normal and breath sounds normal. No respiratory distress. He has no wheezes. He has no rales. He exhibits no tenderness.  Abdominal: Soft. Bowel sounds are normal. He exhibits no distension and no mass. There is no tenderness. There is no rebound and no guarding.  Musculoskeletal: Normal range of motion. He exhibits tenderness. He exhibits no edema.  Lymphadenopathy:    He has no cervical adenopathy.  Neurological: He is alert and oriented to person, place, and time. He has normal reflexes. No cranial nerve deficit. He exhibits normal muscle tone. He displays a negative Romberg sign. Coordination and gait normal.  Skin: Skin is warm and dry. No rash noted.  Psychiatric: He has a normal mood and affect. His behavior is normal. Judgment and thought content normal.  L hip, LS spine - tender w/ROM A/o/c  Lab Results  Component Value Date   WBC 7.7 02/03/2016   HGB 16.3 02/03/2016   HCT 47.8 02/03/2016   PLT 218.0 02/03/2016   GLUCOSE 74 02/03/2016   CHOL 136 02/03/2016   TRIG 133.0 02/03/2016   HDL 31.60 (L) 02/03/2016   LDLCALC 78 02/03/2016   ALT 15 02/03/2016   AST 15 02/03/2016   NA 139 02/03/2016   K 3.8 02/03/2016   CL 104 02/03/2016   CREATININE 0.89 02/03/2016   BUN  14 02/03/2016   CO2 28 02/03/2016   TSH 1.36 02/03/2016   PSA 0.81 02/03/2016   INR 1.00 03/07/2013   HGBA1C 5.1 01/10/2014    Dg Chest 2 View  Result Date: 03/07/2013 CLINICAL DATA:  Preoperative radiograph. Right total hip arthroplasty. EXAM: CHEST  2 VIEW COMPARISON:  CT chest 01/24/2004. FINDINGS: Old granulomatous disease is present. The cardiopericardial silhouette is borderline for projection. Cervical ACDF plate. No airspace disease or effusion. Thoracic spondylosis. Surgical clips in the upper abdomen are likely for cholecystectomy. IMPRESSION: No active cardiopulmonary disease.  Old granulomatous disease. Electronically Signed   By: Dereck Ligas M.D.   On: 03/07/2013 16:23    Assessment & Plan:   There are no diagnoses linked  to this encounter. I am having [name] maintain his Vitamin B-12 CR, cholecalciferol, DSS, ferrous sulfate, polyethylene glycol, acetaminophen, benazepril-hydrochlorthiazide, aspirin EC, multivitamin, propranolol, buPROPion, gabapentin, clotrimazole-betamethasone, tiZANidine, clonazePAM, testosterone cypionate, morphine, oxyCODONE, propranolol, FLUoxetine, traZODone, and amLODipine.  No orders of the defined types were placed in this encounter.    Follow-up: No Follow-up on file.  Walker Kehr, MD

## 2016-05-06 NOTE — BH Assessment (Signed)
Assessment Note  Vernon Bishop is a 76 y.o. male who presented to [facility] as a voluntary walk-in.  He was accompanied by his wife Antwoine Mcguffie and came at the recommendation of his physician, Dr. .[name].  Wife is concerned that Pt is experiencing delusions.  Wife and Pt provided history.  Pt has a history of major depressive disorder and has experienced suicidal ideation and at least one suicide attempt (about 50 years ago).  Pt endorsed the following ongoing symptoms:  Episodes of passive suicidal ideation; poor sleep (3 hours per night); persistent despondency; fair appetite; occasions of auditory hallucination (hearing indistinct voices); feelings of worthlessness.  Per wife, Pt has also exhibited delusions for the last seven years, to wit Pt believes that she cheated on him with another man.  Pt stated that he believes this happened; wife denied.  Pt stated that he wants to hit the man with whom he suspects his wife cheated.  There is no history of domestic violence, and Pt has not engaged the man whom he suspects of having an affair with his wife.  Pt stated that he is treated for back pain (due to fall), and he takes Prozac, Welbutrin, and Klonipin for depressive symptoms and anxiety.  As indicated above, Pt attempted suicide about 50 years ago and was treated inpatient in 1998.  During assessment, Pt presented as alert and oriented.  He had good eye contact and was cooperative.  Pt's mood was reported as "fine" and affect was calm and mood-congruent.  Pt endorsed periods of passive suicidal ideation and other depressive symptoms as well as periods of anxiety (see above).  Pt denied homicidal ideation, self-injury, or substance use.  Pt's speech was normal in rate, rhythm, and volume.  Pt's thought process was within normal range.  Thought content indicated the possible presence of delusion.  Pt's memory and concentration was fair.  Pt's insight, judgment, and impulse control were fair.  Consulted  with Starleen Arms NP.  Due to the nature of the possible delusion, recommended referral outpatient psychiatry to review medication, look for possible drug interactions.  Also recommended therapy.  Diagnosis: Major Depressive Disorder, recurrent, moderate; Anxiety; r/o Delusional Disorder  Past Medical History:  Past Medical History:  Diagnosis Date  . Atrial fibrillation ([place])    1992  . DDD (degenerative disc disease)   . DDD (degenerative disc disease)   . Depression   . Diabetes mellitus    type II  - PT STATES NOT ON ANY DIABETIC MEDICINES  . ED (erectile dysfunction)   . Hyperlipidemia   . Hypertension   . Hypogonadism male   . LBP (low back pain)   . MVP (mitral valve prolapse)   . OSA on CPAP   . Osteoarthritis   . Osteoarthritis, hand    SEVERE LOWER BACK PAIN, AND RESTLESS LEG SYNDROME  . Palpitations   . PTSD (post-traumatic stress disorder)   . Swallowing problem    HX OF PILLS GETTING "STUCK" IN THROAT AT TIMES  . Vitamin D deficiency     Past Surgical History:  Procedure Laterality Date  . APPENDECTOMY    . BACK SURGERY  1987  . CERVICAL LAMINECTOMY  2004   [name], rod was placed- HAS ROM LIMITATIONS  . CHOLECYSTECTOMY    . COLONOSCOPY    . KNEE SURGERY     left knee/arthroscopic  . TOTAL HIP ARTHROPLASTY Right 03/14/2013   Procedure: RIGHT TOTAL HIP ARTHROPLASTY ANTERIOR APPROACH;  Surgeon: [name], MD;  Location: [facility] ORS;  Service: Orthopedics;  Laterality: Right;    Family History:  Family History  Problem Relation Age of Onset  . Allergies Mother   . Stroke Mother     Clotting disorders  . Heart disease Mother   . Heart disease Father   . Heart attack Father 45  . Heart disease Brother   . Rheum arthritis Paternal Grandmother   . Other Paternal Grandmother     Rheumatism  . Coronary artery disease Other   . Diabetes Other   . Hypertension Other   . Esophageal cancer Maternal Aunt   . Colon cancer Neg Hx   . Rectal cancer Neg Hx   .  Stomach cancer Neg Hx     Social History:  reports that he quit smoking about 45 years ago. His smoking use included Cigarettes. He has a 6.00 pack-year smoking history. He has never used smokeless tobacco. He reports that he drinks about 0.6 oz of alcohol per week . He reports that he does not use drugs.  Additional Social History:  Alcohol / Drug Use Pain Medications: See PTA Prescriptions: See PTA Over the Counter: See PTA History of alcohol / drug use?: No history of alcohol / drug abuse  CIWA: CIWA-Ar BP: (!) 158/68 Pulse Rate: (!) 51 COWS:    Allergies:  Allergies  Allergen Reactions  . Cymbalta [Duloxetine Hcl]     constipation  . Digoxin And Related     HR 41  . Duloxetine     REACTION: constipation  . Prednisolone     Edema, wt gain  . Temazepam     Home Medications:  (Not in a hospital admission)  OB/GYN Status:  No LMP for male patient.  General Assessment Data Location of Assessment: [facility] Assessment Services TTS Assessment: In system Is this a Tele or Face-to-Face Assessment?: Face-to-Face Is this an Initial Assessment or a Re-assessment for this encounter?: Initial Assessment Marital status: Married Living Arrangements: Spouse/significant other Can pt return to current living arrangement?: Yes Admission Status: Voluntary Is patient capable of signing voluntary admission?: Yes Referral Source: Self/Family/Friend Insurance type: [employer]  Medical Screening Exam ([place]) Medical Exam completed: Yes  Crisis Care Plan Living Arrangements: Spouse/significant other Name of Psychiatrist: None currently Name of Therapist: None currently  Education Status Is patient currently in school?: No  Risk to self with the past 6 months Suicidal Ideation: No (Pt endorsed episodes of passive ideation) Has patient been a risk to self within the past 6 months prior to admission? : No Suicidal Intent: No Has patient had any suicidal intent  within the past 6 months prior to admission? : No Is patient at risk for suicide?: No Suicidal Plan?: No Has patient had any suicidal plan within the past 6 months prior to admission? : No Access to Means: Yes Specify Access to Suicidal Means: Weapon at home What has been your use of drugs/alcohol within the last 12 months?: None Previous Attempts/Gestures: Yes How many times?: 1 Triggers for Past Attempts: Unknown Intentional Self Injurious Behavior: None Family Suicide History: Unknown Recent stressful life event(s): Recent negative physical changes (Physical pain due to fall) Persecutory voices/beliefs?: No Depression: Yes Depression Symptoms: Despondent, Insomnia, Isolating, Feeling angry/irritable, Feeling worthless/self pity Substance abuse history and/or treatment for substance abuse?: No Suicide prevention information given to non-admitted patients: Not applicable  Risk to Others within the past 6 months Homicidal Ideation: No Does patient have any lifetime risk of violence toward others beyond  the six months prior to admission? : No Thoughts of Harm to Others: Yes-Currently Present Comment - Thoughts of Harm to Others: See notes -- Pt may have delusion Current Homicidal Intent: No Current Homicidal Plan: No Access to Homicidal Means: No History of harm to others?: No Assessment of Violence: None Noted Does patient have access to weapons?: Yes (Comment) Criminal Charges Pending?: No Does patient have a court date: No Is patient on probation?: No  Psychosis Hallucinations: Auditory (Pt stated that sometimes he hears indistinct voices) Delusions: Unspecified (Per wife, Pt has persistent belief that she cheated on him)  Mental Status Report Appearance/Hygiene: Unremarkable, Other (Comment) (Street clothes, casual, stated age) Eye Contact: Good Motor Activity: Unremarkable Speech: Unremarkable, Logical/coherent Level of Consciousness: Alert Mood: Ambivalent Affect:  Blunted, Appropriate to circumstance Anxiety Level: None Thought Processes: Coherent, Relevant Judgement: Partial Orientation: Person, Place, Time, Situation Obsessive Compulsive Thoughts/Behaviors: None  Cognitive Functioning Concentration: Good Memory: Remote Intact, Recent Intact IQ: Average Insight: Fair Impulse Control: Fair Appetite: Fair Sleep: Decreased Total Hours of Sleep: 3 Vegetative Symptoms: None  ADLScreening [facility] Assessment Services) Patient's cognitive ability adequate to safely complete daily activities?: Yes Patient able to express need for assistance with ADLs?: Yes Independently performs ADLs?: Yes (appropriate for developmental age)  Prior Inpatient Therapy Prior Inpatient Therapy: Yes Prior Therapy Dates: 1998 Prior Therapy Facilty/Provider(s): [facility] Reason for Treatment: SI  Prior Outpatient Therapy Prior Outpatient Therapy: No Does patient have an ACCT team?: No Does patient have Intensive In-House Services?  : No Does patient have [facility] services? : No Does patient have P4CC services?: No  ADL Screening (condition at time of admission) Patient's cognitive ability adequate to safely complete daily activities?: Yes Is the patient deaf or have difficulty hearing?: No Does the patient have difficulty seeing, even when wearing glasses/contacts?: No Does the patient have difficulty concentrating, remembering, or making decisions?: No Patient able to express need for assistance with ADLs?: Yes Does the patient have difficulty dressing or bathing?: No Independently performs ADLs?: Yes (appropriate for developmental age) Does the patient have difficulty walking or climbing stairs?: No Weakness of Legs: None Weakness of Arms/Hands: None  Home Assistive Devices/Equipment Home Assistive Devices/Equipment: None  Therapy Consults (therapy consults require a physician order) PT Evaluation Needed: No OT Evalulation Needed: No SLP Evaluation Needed:  No Abuse/Neglect Assessment (Assessment to be complete while patient is alone) Physical Abuse: Denies Verbal Abuse: Denies Sexual Abuse: Denies Self-Neglect: Denies Values / Beliefs Cultural Requests During Hospitalization: None Spiritual Requests During Hospitalization: None Consults Spiritual Care Consult Needed: No Social Work Consult Needed: No Regulatory affairs officer (For Healthcare) Does Patient Have a Medical Advance Directive?: No    Additional Information 1:1 In Past 12 Months?: No CIRT Risk: No Elopement Risk: No Does patient have medical clearance?: Yes     Disposition:  Disposition Initial Assessment Completed for this Encounter: Yes Disposition of Patient: Outpatient treatment Type of outpatient treatment: Adult (Referred to outpatient resources per Starleen Arms, NP)  On Site Evaluation by:   Reviewed with Physician:    Laurena Slimmer Zannie Locastro 05/06/2016 3:46 PM

## 2016-05-06 NOTE — Assessment & Plan Note (Signed)
ASA

## 2016-05-06 NOTE — H&P (Signed)
Behavioral Health Medical Screening Exam  Vernon Bishop is an 76 y.o. male.  Total Time spent with patient: 15 minutes  Psychiatric Specialty Exam: Physical Exam  Constitutional: He is oriented to person, place, and time. He appears well-developed and well-nourished.  HENT:  Head: Normocephalic and atraumatic.  Neck: Normal range of motion.  Cardiovascular: Normal rate and normal heart sounds.   Respiratory: Effort normal and breath sounds normal.  GI: Soft. Bowel sounds are normal.  Musculoskeletal: Normal range of motion.  Neurological: He is alert and oriented to person, place, and time.  Skin: Skin is warm and dry.    Review of Systems  Psychiatric/Behavioral: Positive for depression and suicidal ideas (History of SA 50 years ago). Negative for hallucinations, memory loss and substance abuse. The patient is not nervous/anxious and does not have insomnia.   All other systems reviewed and are negative.   BP (!) 158/68 (BP Location: Right Arm)   Pulse (!) 51   Temp 98.4 F (36.9 C) (Oral)   Resp 16   SpO2 97%    General Appearance: Casual and Fairly Groomed  Eye Contact:  Good  Speech:  Clear and Coherent and Normal Rate  Volume:  Normal  Mood:  Depressed  Affect:  Congruent and Depressed  Thought Process:  Coherent and Linear  Orientation:  Full (Time, Place, and Person)  Thought Content:  Logical  Suicidal Thoughts:  No  Homicidal Thoughts:  Yes.  without intent/plan, passive thoughts  Memory:  Immediate;   Good Recent;   Good Remote;   Fair  Judgement:  Good  Insight:  Good  Psychomotor Activity:  Normal  Concentration: Concentration: Good and Attention Span: Good  Recall:  Good  Fund of Knowledge:Good  Language: Good  Akathisia:  No  Handed:  Right  AIMS (if indicated):     Assets:  Communication Skills Desire for Improvement Financial Resources/Insurance Housing Resilience Social Support Transportation  Sleep:       Musculoskeletal: Strength &  Muscle Tone: within normal limits Gait & Station: normal Patient leans: N/A  BP (!) 158/68 (BP Location: Right Arm)   Pulse (!) 51   Temp 98.4 F (36.9 C) (Oral)   Resp 16   SpO2 97%   Recommendations:  Based on my evaluation the patient does not appear to have an emergency medical condition.  Ethelene Hal, NP 05/06/2016, 3:39 PM

## 2016-05-06 NOTE — Assessment & Plan Note (Signed)
Discussed  ?etiology, meds side effects including They will go to [facility] to see an intake person now

## 2016-05-06 NOTE — Assessment & Plan Note (Addendum)
Cont w/Oxycodone, MS-contin, Tizanidine  Potential benefits of a long term opioids use as well as potential risks (i.e. addiction risk, apnea etc) and complications (i.e. Somnolence, constipation and others) were explained to the patient and were aknowledged. UDS We discussed the potential need to reduce his pain meds

## 2016-05-06 NOTE — Assessment & Plan Note (Signed)
On B12 

## 2016-05-06 NOTE — Progress Notes (Signed)
Pre visit review using our clinic review tool, if applicable. No additional management support is needed unless otherwise documented below in the visit note. 

## 2016-05-06 NOTE — Assessment & Plan Note (Signed)
Cont w/Lotensin HCT, Amlodipine, Inderal ?

## 2016-05-06 NOTE — Assessment & Plan Note (Signed)
S/p contusion/fall in 12/17 X ray

## 2016-06-03 ENCOUNTER — Telehealth: Payer: Self-pay | Admitting: *Deleted

## 2016-06-03 NOTE — Telephone Encounter (Signed)
Rec'd fax pt requesting refill on clonazepam 0.5 mg. Last filled 05/01/16...[name]

## 2016-06-04 ENCOUNTER — Other Ambulatory Visit: Payer: Self-pay | Admitting: Internal Medicine

## 2016-06-04 NOTE — Telephone Encounter (Signed)
OK to fill this/these prescription(s) with additional refills x0 Thank you!  

## 2016-06-05 NOTE — Telephone Encounter (Signed)
Called refill into [facility] spoke w/stephanie gave MD approval.../lmb

## 2016-06-05 NOTE — Telephone Encounter (Signed)
Refill has been called into pharmacy...[name]

## 2016-06-17 ENCOUNTER — Encounter (INDEPENDENT_AMBULATORY_CARE_PROVIDER_SITE_OTHER): Payer: Self-pay

## 2016-06-17 ENCOUNTER — Ambulatory Visit (INDEPENDENT_AMBULATORY_CARE_PROVIDER_SITE_OTHER): Payer: PPO | Admitting: Psychiatry

## 2016-06-17 ENCOUNTER — Encounter (HOSPITAL_COMMUNITY): Payer: Self-pay | Admitting: Psychiatry

## 2016-06-17 VITALS — BP 138/78 | HR 51 | Ht 72.0 in | Wt 233.0 lb

## 2016-06-17 DIAGNOSIS — Z87891 Personal history of nicotine dependence: Secondary | ICD-10-CM

## 2016-06-17 DIAGNOSIS — F332 Major depressive disorder, recurrent severe without psychotic features: Secondary | ICD-10-CM

## 2016-06-17 DIAGNOSIS — Z79891 Long term (current) use of opiate analgesic: Secondary | ICD-10-CM | POA: Diagnosis not present

## 2016-06-17 DIAGNOSIS — Z79899 Other long term (current) drug therapy: Secondary | ICD-10-CM

## 2016-06-17 DIAGNOSIS — Z888 Allergy status to other drugs, medicaments and biological substances status: Secondary | ICD-10-CM | POA: Diagnosis not present

## 2016-06-17 DIAGNOSIS — Z8659 Personal history of other mental and behavioral disorders: Secondary | ICD-10-CM

## 2016-06-17 MED ORDER — FLUOXETINE HCL 40 MG PO CAPS: 40.0000 mg | ORAL_CAPSULE | Freq: Every day | ORAL | 2 refills | Status: DC

## 2016-06-17 MED ORDER — TRAZODONE HCL 50 MG PO TABS: 50.0000 mg | ORAL_TABLET | Freq: Every day | ORAL | 5 refills | Status: DC

## 2016-06-17 MED ORDER — BUPROPION HCL ER (XL) 300 MG PO TB24: 300.0000 mg | ORAL_TABLET | ORAL | 3 refills | Status: DC
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
## 2016-07-03 ENCOUNTER — Ambulatory Visit (HOSPITAL_COMMUNITY): Payer: Self-pay | Admitting: Psychiatry

## 2016-07-06 ENCOUNTER — Other Ambulatory Visit: Payer: Self-pay | Admitting: Internal Medicine

## 2016-07-07 NOTE — Telephone Encounter (Signed)
Called refill into pharmacy spoke w/pharmacist gave MD approval.../lmb

## 2016-07-10 ENCOUNTER — Ambulatory Visit (HOSPITAL_COMMUNITY): Payer: Self-pay | Admitting: Psychiatry

## 2016-07-13 ENCOUNTER — Ambulatory Visit (HOSPITAL_COMMUNITY): Payer: Self-pay | Admitting: Psychology

## 2016-08-03 ENCOUNTER — Encounter: Payer: Self-pay | Admitting: Internal Medicine

## 2016-08-03 ENCOUNTER — Ambulatory Visit (INDEPENDENT_AMBULATORY_CARE_PROVIDER_SITE_OTHER): Payer: PPO | Admitting: Internal Medicine

## 2016-08-03 VITALS — BP 140/80 | HR 51 | Temp 98.3°F | Ht 72.0 in | Wt 224.1 lb

## 2016-08-03 DIAGNOSIS — F339 Major depressive disorder, recurrent, unspecified: Secondary | ICD-10-CM | POA: Diagnosis not present

## 2016-08-03 DIAGNOSIS — F411 Generalized anxiety disorder: Secondary | ICD-10-CM

## 2016-08-03 DIAGNOSIS — E291 Testicular hypofunction: Secondary | ICD-10-CM

## 2016-08-03 DIAGNOSIS — E119 Type 2 diabetes mellitus without complications: Secondary | ICD-10-CM | POA: Diagnosis not present

## 2016-08-03 DIAGNOSIS — G4733 Obstructive sleep apnea (adult) (pediatric): Secondary | ICD-10-CM

## 2016-08-03 DIAGNOSIS — Z9989 Dependence on other enabling machines and devices: Secondary | ICD-10-CM | POA: Diagnosis not present

## 2016-08-03 DIAGNOSIS — I10 Essential (primary) hypertension: Secondary | ICD-10-CM

## 2016-08-03 MED ORDER — OXYCODONE HCL 15 MG PO TABS: 15.0000 mg | ORAL_TABLET | Freq: Four times a day (QID) | ORAL | 0 refills | Status: DC | PRN

## 2016-08-03 MED ORDER — MORPHINE SULFATE ER 30 MG PO TBCR: 30.0000 mg | EXTENDED_RELEASE_TABLET | Freq: Two times a day (BID) | ORAL | 0 refills | Status: DC

## 2016-08-03 NOTE — Patient Instructions (Signed)
[facility] well w/Jill 

## 2016-08-03 NOTE — Progress Notes (Signed)
Subjective:  Patient ID: Vernon Bishop, male    DOB: Nov 20, 1940  Age: 76 y.o. MRN: [id number]  CC: No chief complaint on file.   HPI Vernon Bishop presents for LBP, HTN, depression f/u. C/o R groin pain x 2 mo worse w/coughing  Outpatient Medications Prior to Visit  Medication Sig Dispense Refill  . acetaminophen (TYLENOL) 500 MG tablet Take 2 tablets (1,000 mg total) by mouth every 8 (eight) hours as needed for mild pain. 30 tablet 0  . amLODipine (NORVASC) 10 MG tablet TAKE 1 TABLET BY MOUTH EVERY MORNING. 90 tablet 3  . aspirin EC 81 MG tablet Take 81 mg by mouth daily.    . benazepril-hydrochlorthiazide (LOTENSIN HCT) 20-12.5 MG per tablet TAKE 2 TABLETS BY MOUTH DAILY. 60 tablet 5  . buPROPion (WELLBUTRIN XL) 300 MG 24 hr tablet Take 1 tablet (300 mg total) by mouth every morning. 30 tablet 3  . Cholecalciferol (VITAMIN D3) 1000 UNIT tablet Take 1,000 Units by mouth daily.      . clonazePAM (KLONOPIN) 0.5 MG tablet TAKE 1 TABLET BY MOUTH UP TO THREE TIMES A DAY AS NEEDED 90 tablet 0  . clotrimazole-betamethasone (LOTRISONE) cream Apply 1 application topically 2 (two) times daily. 60 g 1  . Cyanocobalamin (VITAMIN B-12 CR) 1000 MCG TBCR Take 1,000 mcg by mouth daily.      Marland Kitchen docusate sodium 100 MG CAPS Take 100 mg by mouth 2 (two) times daily. 10 capsule 0  . ferrous sulfate 325 (65 FE) MG tablet Take 1 tablet (325 mg total) by mouth 3 (three) times daily after meals.  3  . FLUoxetine (PROZAC) 40 MG capsule Take 1 capsule (40 mg total) by mouth daily. 90 capsule 2  . gabapentin (NEURONTIN) 300 MG capsule Take 1 capsule (300 mg total) by mouth 3 (three) times daily. 90 capsule 3  . morphine (MS CONTIN) 30 MG 12 hr tablet Take 1 tablet (30 mg total) by mouth every 12 (twelve) hours. 60 tablet 0  . Multiple Vitamin (MULTIVITAMIN) tablet Take 1 tablet by mouth daily.    Marland Kitchen oxyCODONE (ROXICODONE) 15 MG immediate release tablet Take 1 tablet (15 mg total) by mouth every 6 (six) hours as  needed for pain. Please fill on or after 07/15/16 120 tablet 0  . polyethylene glycol (MIRALAX / GLYCOLAX) packet Take 17 g by mouth 2 (two) times daily. 14 each 0  . propranolol (INDERAL) 80 MG tablet TAKE 1 TABLET BY MOUTH 3 TIMES DAILY. 270 tablet 1  . testosterone cypionate (DEPOTESTOSTERONE CYPIONATE) 200 MG/ML injection INJECT 3 MILLILITERS (600 MG TOTAL) INTO THE MUSCLE EVERY 14 DAYS. 10 mL 5  . tiZANidine (ZANAFLEX) 4 MG tablet TAKE 1 TABLET BY MOUTH EVERY 8 HOURS AS NEEDED FOR MUSCLE SPASMS. 60 tablet 1  . traZODone (DESYREL) 50 MG tablet Take 1-2 tablets (50-100 mg total) by mouth at bedtime. 60 tablet 5  . buPROPion (WELLBUTRIN SR) 150 MG 12 hr tablet Take 1 tablet (150 mg total) by mouth 2 (two) times daily. 60 tablet 5   No facility-administered medications prior to visit.     ROS Review of Systems  Constitutional: Negative for appetite change, fatigue and unexpected weight change.  HENT: Negative for congestion, nosebleeds, sneezing, sore throat and trouble swallowing.   Eyes: Negative for itching and visual disturbance.  Respiratory: Negative for cough.   Cardiovascular: Negative for chest pain, palpitations and leg swelling.  Gastrointestinal: Negative for abdominal distention, blood in stool, diarrhea and nausea.  Genitourinary: Negative for frequency and hematuria.  Musculoskeletal: Positive for back pain and gait problem. Negative for joint swelling and neck pain.  Skin: Negative for rash.  Neurological: Negative for dizziness, tremors, speech difficulty and weakness.  Psychiatric/Behavioral: Negative for agitation, dysphoric mood, sleep disturbance and suicidal ideas. The patient is not nervous/anxious.     Objective:  BP (!) 182/78 (BP Location: Left Arm, Patient Position: Sitting, Cuff Size: Large)   Pulse (!) 51   Temp 98.3 F (36.8 C) (Oral)   Ht 6' (1.829 m)   Wt 224 lb 1.9 oz (101.7 kg)   SpO2 98%   BMI 30.40 kg/m   BP Readings from Last 3 Encounters:    08/03/16 (!) 182/78  05/06/16 140/78  02/03/16 139/80    Wt Readings from Last 3 Encounters:  08/03/16 224 lb 1.9 oz (101.7 kg)  05/06/16 232 lb 12 oz (105.6 kg)  02/03/16 224 lb (101.6 kg)    Physical Exam  Constitutional: He is oriented to person, place, and time. He appears well-developed. No distress.  NAD  HENT:  Mouth/Throat: Oropharynx is clear and moist.  Eyes: Conjunctivae are normal. Pupils are equal, round, and reactive to light.  Neck: Normal range of motion. No JVD present. No thyromegaly present.  Cardiovascular: Normal rate, regular rhythm, normal heart sounds and intact distal pulses.  Exam reveals no gallop and no friction rub.   No murmur heard. Pulmonary/Chest: Effort normal and breath sounds normal. No respiratory distress. He has no wheezes. He has no rales. He exhibits no tenderness.  Abdominal: Soft. Bowel sounds are normal. He exhibits no distension and no mass. There is no tenderness. There is no rebound and no guarding.  Musculoskeletal: Normal range of motion. He exhibits tenderness. He exhibits no edema.  Lymphadenopathy:    He has no cervical adenopathy.  Neurological: He is alert and oriented to person, place, and time. He has normal reflexes. No cranial nerve deficit. He exhibits normal muscle tone. He displays a negative Romberg sign. Coordination and gait normal.  Skin: Skin is warm and dry. No rash noted.  Psychiatric: He has a normal mood and affect. His behavior is normal. Judgment and thought content normal.  LS tender  Lab Results  Component Value Date   WBC 7.7 02/03/2016   HGB 16.3 02/03/2016   HCT 47.8 02/03/2016   PLT 218.0 02/03/2016   GLUCOSE 74 02/03/2016   CHOL 136 02/03/2016   TRIG 133.0 02/03/2016   HDL 31.60 (L) 02/03/2016   LDLCALC 78 02/03/2016   ALT 15 02/03/2016   AST 15 02/03/2016   NA 139 02/03/2016   K 3.8 02/03/2016   CL 104 02/03/2016   CREATININE 0.89 02/03/2016   BUN 14 02/03/2016   CO2 28 02/03/2016   TSH  1.36 02/03/2016   PSA 0.81 02/03/2016   INR 1.00 03/07/2013   HGBA1C 5.1 01/10/2014    Dg Hip Unilat With Pelvis 2-3 Views Left  Result Date: 05/06/2016 CLINICAL DATA:  Fall 1 month ago with persistent left hip pain EXAM: DG HIP (WITH OR WITHOUT PELVIS) 2-3V LEFT COMPARISON:  [facility] pelvis x-ray dated 14 March 2013 FINDINGS: The bony pelvis is subjectively adequately mineralized. No acute fracture is observed. The observed portions of the sacrum are grossly normal. 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 On the left the hip joint spaces reasonably well-maintained. There is some scleroses of the inferior aspect of the acetabulum. The femoral head and acetabular articular surfaces remain smooth. The femoral neck,  intertrochanteric, and subtrochanteric regions are normal. IMPRESSION: There is mild degenerative change of the left hip without significant joint space loss. There is no acute or healing fracture or dislocation. Electronically Signed   By: David  Martinique M.D.   On: 05/06/2016 15:05    Assessment & Plan:   There are no diagnoses linked to this encounter. I am having Mr. Carlota Raspberry maintain his Vitamin B-12 CR, cholecalciferol, DSS, ferrous sulfate, polyethylene glycol, acetaminophen, benazepril-hydrochlorthiazide, aspirin EC, multivitamin, propranolol, gabapentin, clotrimazole-betamethasone, tiZANidine, testosterone cypionate, amLODipine, oxyCODONE, morphine, buPROPion, FLUoxetine, traZODone, and clonazePAM.  No orders of the defined types were placed in this encounter.    Follow-up: No Follow-up on file.  Walker Kehr, MD

## 2016-08-03 NOTE — Progress Notes (Signed)
Pre visit review using our clinic review tool, if applicable. No additional management support is needed unless otherwise documented below in the visit note. 

## 2016-08-03 NOTE — Assessment & Plan Note (Signed)
Labs

## 2016-08-03 NOTE — Assessment & Plan Note (Signed)
Clonazepam prn 

## 2016-08-03 NOTE — Assessment & Plan Note (Addendum)
Lotensin HCT, Amlodipine, Inderal Take BP meds today

## 2016-08-03 NOTE — Assessment & Plan Note (Signed)
Oxycodone, MS-contin, Tizanidine Chronic Potential benefits of a long term opioids use as well as potential risks (i.e. addiction risk, apnea etc) and complications (i.e. Somnolence, constipation and others) were explained to the patient and were aknowledged.   

## 2016-08-03 NOTE — Assessment & Plan Note (Signed)
On CPAP. ?

## 2016-08-03 NOTE — Assessment & Plan Note (Signed)
On Fluoxetine  

## 2016-08-04 ENCOUNTER — Encounter (HOSPITAL_COMMUNITY): Payer: Self-pay | Admitting: Psychology

## 2016-08-04 ENCOUNTER — Ambulatory Visit (INDEPENDENT_AMBULATORY_CARE_PROVIDER_SITE_OTHER): Payer: PPO | Admitting: Psychology

## 2016-08-04 DIAGNOSIS — F332 Major depressive disorder, recurrent severe without psychotic features: Secondary | ICD-10-CM | POA: Diagnosis not present
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
## 2016-08-06 ENCOUNTER — Other Ambulatory Visit: Payer: Self-pay | Admitting: Internal Medicine

## 2016-08-12 ENCOUNTER — Other Ambulatory Visit: Payer: Self-pay | Admitting: Internal Medicine

## 2016-08-12 NOTE — Telephone Encounter (Signed)
Called pharmacy spoke w/ pharmacist Legrand Como gave MD approval.../lmb

## 2016-08-18 ENCOUNTER — Telehealth: Payer: Self-pay | Admitting: *Deleted

## 2016-08-18 MED ORDER — TESTOSTERONE CYPIONATE 200 MG/ML IM SOLN: INTRAMUSCULAR | 5 refills | Status: DC

## 2016-08-18 NOTE — Telephone Encounter (Signed)
Rec'd fax pt requesting refill on his Testosterone. Last filled 05/25/16...Vernon Bishop

## 2016-08-18 NOTE — Telephone Encounter (Signed)
OK to fill this/these prescription(s) with additional refills x5  Thank you!  

## 2016-08-18 NOTE — Telephone Encounter (Signed)
Refill has been called into [facility] spoke w/Lena gave MD approval.../lmb

## 2016-08-19 ENCOUNTER — Ambulatory Visit (INDEPENDENT_AMBULATORY_CARE_PROVIDER_SITE_OTHER): Payer: PPO | Admitting: Psychiatry

## 2016-08-19 ENCOUNTER — Encounter (HOSPITAL_COMMUNITY): Payer: Self-pay | Admitting: Psychiatry

## 2016-08-19 VITALS — BP 148/70 | HR 55 | Ht 66.0 in | Wt 229.0 lb

## 2016-08-19 DIAGNOSIS — Z79891 Long term (current) use of opiate analgesic: Secondary | ICD-10-CM | POA: Diagnosis not present

## 2016-08-19 DIAGNOSIS — Z888 Allergy status to other drugs, medicaments and biological substances status: Secondary | ICD-10-CM

## 2016-08-19 DIAGNOSIS — F329 Major depressive disorder, single episode, unspecified: Secondary | ICD-10-CM

## 2016-08-19 DIAGNOSIS — Z79899 Other long term (current) drug therapy: Secondary | ICD-10-CM | POA: Diagnosis not present

## 2016-08-19 DIAGNOSIS — Z87891 Personal history of nicotine dependence: Secondary | ICD-10-CM | POA: Diagnosis not present

## 2016-08-19 DIAGNOSIS — F323 Major depressive disorder, single episode, severe with psychotic features: Secondary | ICD-10-CM

## 2016-08-19 DIAGNOSIS — Z7982 Long term (current) use of aspirin: Secondary | ICD-10-CM | POA: Diagnosis not present

## 2016-08-19 MED ORDER — ARIPIPRAZOLE 5 MG PO TABS: 5.0000 mg | ORAL_TABLET | Freq: Every day | ORAL | 4 refills | Status: DC

## 2016-08-19 MED ORDER — BUPROPION HCL ER (XL) 150 MG PO TB24: ORAL_TABLET | ORAL | 3 refills | Status: DC

## 2016-08-19 NOTE — Progress Notes (Signed)
Psychiatric Initial Adult Assessment   Patient Identification: Vernon Bishop MRN:  [id number] Date of Evaluation:  08/19/2016 Referral Source: Dr. Marianne Sofia Chief Complaint:   Chief Complaint    Follow-up     Visit Diagnosis: No diagnosis found.  History of Present Illness:   At this time the patient is no better. Today he was seen with his wife. She presented in front of him delusional that she was having an affair. I do not believe she is having a fair I believe this is part of his depression disorder. Today he was much more clear that he is persistently depressed. He doesn't enjoy very much at all her life and barely watches TV. He cannot place the idea of suicide but it won't affect his he made a commitment to his primary care doctor trust his primary care doctor once to do what he says. The patient does have some guns at home but they're locked up. The patient says he has no intent to hurt himself. He clearly wants to get better. He wants some resolve related to his wife. He is actually sleeping and eating fairly well. He's got good energy he does admit to feeling worthless. He drinks no alcohol uses no drugs. (Hypo) Manic Symptoms:   Anxiety Symptoms:   Psychotic Symptoms:   PTSD Symptoms:   Past Psychiatric History: Psychiatric hospitalization on time and to antidepressants  Previous Psychotropic Medications: Yes   Substance Abuse History in the last 12 months:  No.  Consequences of Substance Abuse:   Past Medical History:  Past Medical History:  Diagnosis Date  . Atrial fibrillation ([place])    1992  . DDD (degenerative disc disease)   . DDD (degenerative disc disease)   . Depression   . Diabetes mellitus    type II  - PT STATES NOT ON ANY DIABETIC MEDICINES  . ED (erectile dysfunction)   . Hyperlipidemia   . Hypertension   . Hypogonadism male   . LBP (low back pain)   . MVP (mitral valve prolapse)   . OSA on CPAP   . Osteoarthritis   . Osteoarthritis, hand     SEVERE LOWER BACK PAIN, AND RESTLESS LEG SYNDROME  . Palpitations   . PTSD (post-traumatic stress disorder)   . Swallowing problem    HX OF PILLS GETTING "STUCK" IN THROAT AT TIMES  . Vitamin D deficiency     Past Surgical History:  Procedure Laterality Date  . APPENDECTOMY    . BACK SURGERY  1987  . CERVICAL LAMINECTOMY  2004   [name], rod was placed- HAS ROM LIMITATIONS  . CHOLECYSTECTOMY    . COLONOSCOPY    . KNEE SURGERY     left knee/arthroscopic  . TOTAL HIP ARTHROPLASTY Right 03/14/2013   Procedure: RIGHT TOTAL HIP ARTHROPLASTY ANTERIOR APPROACH;  Surgeon: [name], MD;  Location: [facility];  Service: Orthopedics;  Laterality: Right;    Family Psychiatric History:   Family History:  Family History  Problem Relation Age of Onset  . Allergies Mother   . Stroke Mother        Clotting disorders  . Heart disease Mother   . Heart disease Father   . Heart attack Father 10  . Heart disease Brother   . Rheum arthritis Paternal Grandmother   . Other Paternal Grandmother        Rheumatism  . Coronary artery disease Other   . Diabetes Other   . Hypertension Other   . Esophageal cancer  Maternal Aunt   . Colon cancer Neg Hx   . Rectal cancer Neg Hx   . Stomach cancer Neg Hx     Social History:   Social History   Social History  . Marital status: Married    Spouse name: N/A  . Number of children: 4  . Years of education: N/A   Occupational History  . Retired- Retired    Programmer, applications   Social History Main Topics  . Smoking status: Former Smoker    Packs/day: 0.50    Years: 12.00    Types: Cigarettes    Quit date: 03/31/1971  . Smokeless tobacco: Never Used     Comment: Started at age 71  . Alcohol use 1.2 oz/week    2 Cans of beer per week     Comment: occasionally  . Drug use: No  . Sexual activity: Yes   Other Topics Concern  . None   Social History Narrative   FAMILY HISTORY   History of CAD Male 1st degree relative <50   History Diabetes 1st  degree relative   History hypertension                Additional Social History:   Allergies:   Allergies  Allergen Reactions  . Cymbalta [Duloxetine Hcl]     constipation  . Digoxin And Related     HR 41  . Duloxetine     REACTION: constipation  . Prednisolone     Edema, wt gain  . Temazepam     Metabolic Disorder Labs: Lab Results  Component Value Date   HGBA1C 5.1 01/10/2014   No results found for: PROLACTIN Lab Results  Component Value Date   CHOL 136 02/03/2016   TRIG 133.0 02/03/2016   HDL 31.60 (L) 02/03/2016   CHOLHDL 4 02/03/2016   VLDL 26.6 02/03/2016   LDLCALC 78 02/03/2016   LDLCALC 93 02/13/2015     Current Medications: Current Outpatient Prescriptions  Medication Sig Dispense Refill  . acetaminophen (TYLENOL) 500 MG tablet Take 2 tablets (1,000 mg total) by mouth every 8 (eight) hours as needed for mild pain. 30 tablet 0  . amLODipine (NORVASC) 10 MG tablet TAKE 1 TABLET BY MOUTH EVERY MORNING. 90 tablet 3  . aspirin EC 81 MG tablet Take 81 mg by mouth daily.    . benazepril-hydrochlorthiazide (LOTENSIN HCT) 20-12.5 MG per tablet TAKE 2 TABLETS BY MOUTH DAILY. 60 tablet 5  . buPROPion (WELLBUTRIN XL) 150 MG 24 hr tablet 3  qam 90 tablet 3  . Cholecalciferol (VITAMIN D3) 1000 UNIT tablet Take 1,000 Units by mouth daily.      . clonazePAM (KLONOPIN) 0.5 MG tablet TAKE 1 TABLET BY MOUTH UP TO 3 TIMES A DAY AS NEEDED. 90 tablet 0  . clotrimazole-betamethasone (LOTRISONE) cream Apply 1 application topically 2 (two) times daily. 60 g 1  . Cyanocobalamin (VITAMIN B-12 CR) 1000 MCG TBCR Take 1,000 mcg by mouth daily.      Marland Kitchen docusate sodium 100 MG CAPS Take 100 mg by mouth 2 (two) times daily. 10 capsule 0  . FLUoxetine (PROZAC) 40 MG capsule Take 1 capsule (40 mg total) by mouth daily. 90 capsule 2  . gabapentin (NEURONTIN) 300 MG capsule Take 1 capsule (300 mg total) by mouth 3 (three) times daily. 90 capsule 3  . morphine (MS CONTIN) 30 MG 12 hr tablet  Take 1 tablet (30 mg total) by mouth every 12 (twelve) hours. 60 tablet 0  . oxyCODONE (  ROXICODONE) 15 MG immediate release tablet Take 1 tablet (15 mg total) by mouth every 6 (six) hours as needed for pain. Please fill on or after 10/14/16 120 tablet 0  . polyethylene glycol (MIRALAX / GLYCOLAX) packet Take 17 g by mouth 2 (two) times daily. 14 each 0  . propranolol (INDERAL) 80 MG tablet TAKE 1 TABLET BY MOUTH 3 TIMES DAILY. 270 tablet 1  . testosterone cypionate (DEPOTESTOSTERONE CYPIONATE) 200 MG/ML injection INJECT 3 MILLILITERS (600 MG TOTAL) INTO THE MUSCLE EVERY 14 DAYS. 10 mL 5  . traZODone (DESYREL) 50 MG tablet Take 1-2 tablets (50-100 mg total) by mouth at bedtime. 60 tablet 5  . ARIPiprazole (ABILIFY) 5 MG tablet Take 1 tablet (5 mg total) by mouth daily. 30 tablet 4  . ferrous sulfate 325 (65 FE) MG tablet Take 1 tablet (325 mg total) by mouth 3 (three) times daily after meals. (Patient not taking: Reported on 08/19/2016)  3  . Multiple Vitamin (MULTIVITAMIN) tablet Take 1 tablet by mouth daily.    Marland Kitchen tiZANidine (ZANAFLEX) 4 MG tablet TAKE 1 TABLET BY MOUTH EVERY 8 HOURS AS NEEDED FOR MUSCLE SPASMS. (Patient not taking: Reported on 08/19/2016) 60 tablet 1   No current facility-administered medications for this visit.     Neurologic: Headache: No Seizure: No Paresthesias:No  Musculoskeletal: Strength & Muscle Tone: within normal limits Gait & Station: broad based Patient leans: N/A  Psychiatric Specialty Exam: ROS  Blood pressure (!) 148/70, pulse (!) 55, height 5\' 6"  (1.676 m), weight 229 lb (103.9 kg).Body mass index is 36.96 kg/m.  General Appearance: Casual  Eye Contact:  Good  Speech:  Clear and Coherent  Volume:  Normal  Mood:  Depressed  Affect:  Constricted  Thought Process:  Coherent  Orientation:  Full (Time, Place, and Person)  Thought Content:  NA  Suicidal Thoughts:  No  Homicidal Thoughts:  No  Memory:  Negative  Judgement:  Fair  Insight:  Fair   Psychomotor Activity:  Normal  Concentration:    Recall:    Fund of Knowledge:Fair  Language: Good  Akathisia:  No  Handed:  Right  AIMS (if indicated):    Assets:  Desire for Improvement  ADL's:  Intact  Cognition: WNL  Sleep:      Treatment Plan Summary: 5/23/20184:43 PM   The patient has been seen by a therapist one time here. Is willing to come back for second visit. My opinion I do believe the patient is psychotic. He believe his clinical depression. I'm not pushing into therapy just with another provider involved. At this time we'll go ahead and begin him on 5 mg of Abilify. To be clear we increase his well-child last time to dose of 300 mg. Today will increase it to the maximum dose of 450 mg. He'll also continue Prozac 40 mg. I do not think the patient is dangerous to himself or others. He's had a chronic suicidal thoughts although it's true that he did make a suicide attempt many years ago I do not think he is close to suicide at this time. He's willing to take his medicines willing to come here willing to enter into therapy. He has a commitment to his primary care doctor not to make an effort to hurt himself. He also believes that he would want her children. Patient agreed to increase the medications described above and return to see me in 6 weeks. The patient instructed that there are any problems she can always call us  he will see the therapist before I see him again.

## 2016-09-10 ENCOUNTER — Ambulatory Visit (INDEPENDENT_AMBULATORY_CARE_PROVIDER_SITE_OTHER): Payer: PPO | Admitting: Psychology

## 2016-09-10 ENCOUNTER — Other Ambulatory Visit: Payer: Self-pay | Admitting: Internal Medicine

## 2016-09-10 DIAGNOSIS — F322 Major depressive disorder, single episode, severe without psychotic features: Secondary | ICD-10-CM

## 2016-09-10 DIAGNOSIS — F323 Major depressive disorder, single episode, severe with psychotic features: Secondary | ICD-10-CM

## 2016-09-10 NOTE — Progress Notes (Signed)
   THERAPIST PROGRESS NOTE  Session Time: 2.32pm-3.14pm  Participation Level: Active  Behavioral Response: Well GroomedAlertDepressed  Type of Therapy: Individual Therapy  Treatment Goals addressed: Diagnosis: MDD and goal 1.  Interventions: CBT, Supportive and Other: wellness strategies  Summary: Vernon Bishop is a 76 y.o. male who presents with affect congruent w/ report of depressed. Pt still focused on wanting his wife to admit affair.  Pt recognized w/ counselor support that might be better to focus on having intentional positive interactions together. Pt did report that his mood has been a little better some days- little brighter and discussed potential of joining silver sneakers program at the gym.  Pt also recognized that moments that wife shows she cares and assisted w/ reframing.  Suicidal/Homicidal: Nowithout intent/plan  Therapist Response: Assessed pt current functioning per pt report. Processed w/pt coping w depressed mood and wellness strategies that might assist- increased exercise/movement- getting out in light.  Also assisted pt in focusing on having positive interactions w/ wife as way of resolving.   Plan: Return again in 3 weeks.  Diagnosis: MDD, severe    Vernon Bishop, [place] 09/10/2016

## 2016-09-11 NOTE — Telephone Encounter (Addendum)
Called refill into [facility] had to leave on pharmacy vm...[name]

## 2016-10-12 ENCOUNTER — Other Ambulatory Visit: Payer: Self-pay | Admitting: Internal Medicine

## 2016-10-14 ENCOUNTER — Encounter (HOSPITAL_COMMUNITY): Payer: Self-pay | Admitting: Psychiatry

## 2016-10-14 ENCOUNTER — Ambulatory Visit (INDEPENDENT_AMBULATORY_CARE_PROVIDER_SITE_OTHER): Payer: PPO | Admitting: Psychiatry

## 2016-10-14 DIAGNOSIS — Z87891 Personal history of nicotine dependence: Secondary | ICD-10-CM

## 2016-10-14 DIAGNOSIS — F323 Major depressive disorder, single episode, severe with psychotic features: Secondary | ICD-10-CM

## 2016-10-14 MED ORDER — ARIPIPRAZOLE 10 MG PO TABS: 5.0000 mg | ORAL_TABLET | Freq: Every day | ORAL | 3 refills | Status: DC

## 2016-10-14 NOTE — Progress Notes (Signed)
Psychiatric Initial Adult Assessment   Patient Identification: Vernon Bishop MRN:  [id number] Date of Evaluation:  10/14/2016 Referral Source: Dr. Marianne Sofia Chief Complaint:    Visit Diagnosis: No diagnosis found.  History of Present Illness:   At this time the patient seems calmer. On the other hand the patient now admits to me that he has auditory hallucinations about every other day. This is new and different than he usually describes. He doesn't have visions. He says he is no longer jealous about his wife are having delusions about it but somehow I don't believe Dorian Pod me the truth. Today he seen with his wife Fraser Din. He looks guarded and scared. Patient says she's not sleeping well. He says a split been for years he had again this is not shared with me on his last visit. He is eating well. He doesn't drink any alcohol or use any drugs. When his last visit we began him on Abilify 5 mg. He thinks it might be helpful but is not clear how. He says he feels less suspicious less paranoid. I'm not clear if that's true. Today was an inappropriate 10 minute visit for this patient is very complex. The patient says he takes Klonopin a few times through the week from another provider. At this time I've asked him to discontinue any the Klonopin. He was only taking a small dose on a when necessary basis but do think the patient takes too many medications. It should be noted that he is on a significant dose of Neurontin and opiates for back pain. Psychotic Symptoms:   PTSD Symptoms:   Past Psychiatric History: Psychiatric hospitalization on time and to antidepressants  Previous Psychotropic Medications: Yes   Substance Abuse History in the last 12 months:  No.  Consequences of Substance Abuse:   Past Medical History:  Past Medical History:  Diagnosis Date  . Atrial fibrillation ([place])    1992  . DDD (degenerative disc disease)   . DDD (degenerative disc disease)   . Depression   . Diabetes  mellitus    type II  - PT STATES NOT ON ANY DIABETIC MEDICINES  . ED (erectile dysfunction)   . Hyperlipidemia   . Hypertension   . Hypogonadism male   . LBP (low back pain)   . MVP (mitral valve prolapse)   . OSA on CPAP   . Osteoarthritis   . Osteoarthritis, hand    SEVERE LOWER BACK PAIN, AND RESTLESS LEG SYNDROME  . Palpitations   . PTSD (post-traumatic stress disorder)   . Swallowing problem    HX OF PILLS GETTING "STUCK" IN THROAT AT TIMES  . Vitamin D deficiency     Past Surgical History:  Procedure Laterality Date  . APPENDECTOMY    . BACK SURGERY  1987  . CERVICAL LAMINECTOMY  2004   [name], rod was placed- HAS ROM LIMITATIONS  . CHOLECYSTECTOMY    . COLONOSCOPY    . KNEE SURGERY     left knee/arthroscopic  . TOTAL HIP ARTHROPLASTY Right 03/14/2013   Procedure: RIGHT TOTAL HIP ARTHROPLASTY ANTERIOR APPROACH;  Surgeon: [name], MD;  Location: [facility];  Service: Orthopedics;  Laterality: Right;    Family Psychiatric History:   Family History:  Family History  Problem Relation Age of Onset  . Allergies Mother   . Stroke Mother        Clotting disorders  . Heart disease Mother   . Heart disease Father   . Heart attack Father 70  .  Heart disease Brother   . Rheum arthritis Paternal Grandmother   . Other Paternal Grandmother        Rheumatism  . Coronary artery disease Other   . Diabetes Other   . Hypertension Other   . Esophageal cancer Maternal Aunt   . Colon cancer Neg Hx   . Rectal cancer Neg Hx   . Stomach cancer Neg Hx     Social History:   Social History   Social History  . Marital status: Married    Spouse name: N/A  . Number of children: 4  . Years of education: N/A   Occupational History  . Retired- Retired    Programmer, applications   Social History Main Topics  . Smoking status: Former Smoker    Packs/day: 0.50    Years: 12.00    Types: Cigarettes    Quit date: 03/31/1971  . Smokeless tobacco: Never Used     Comment: Started at age  31  . Alcohol use 1.2 oz/week    2 Cans of beer per week     Comment: occasionally  . Drug use: No  . Sexual activity: Yes   Other Topics Concern  . None   Social History Narrative   FAMILY HISTORY   History of CAD Male 1st degree relative <50   History Diabetes 1st degree relative   History hypertension                Additional Social History:   Allergies:   Allergies  Allergen Reactions  . Cymbalta [Duloxetine Hcl]     constipation  . Digoxin And Related     HR 41  . Duloxetine     REACTION: constipation  . Prednisolone     Edema, wt gain  . Temazepam     Metabolic Disorder Labs: Lab Results  Component Value Date   HGBA1C 5.1 01/10/2014   No results found for: PROLACTIN Lab Results  Component Value Date   CHOL 136 02/03/2016   TRIG 133.0 02/03/2016   HDL 31.60 (L) 02/03/2016   CHOLHDL 4 02/03/2016   VLDL 26.6 02/03/2016   LDLCALC 78 02/03/2016   LDLCALC 93 02/13/2015     Current Medications: Current Outpatient Prescriptions  Medication Sig Dispense Refill  . acetaminophen (TYLENOL) 500 MG tablet Take 2 tablets (1,000 mg total) by mouth every 8 (eight) hours as needed for mild pain. 30 tablet 0  . amLODipine (NORVASC) 10 MG tablet TAKE 1 TABLET BY MOUTH EVERY MORNING. 90 tablet 3  . ARIPiprazole (ABILIFY) 10 MG tablet Take 0.5 tablets (5 mg total) by mouth daily. 30 tablet 3  . aspirin EC 81 MG tablet Take 81 mg by mouth daily.    . benazepril-hydrochlorthiazide (LOTENSIN HCT) 20-12.5 MG per tablet TAKE 2 TABLETS BY MOUTH DAILY. 60 tablet 5  . buPROPion (WELLBUTRIN XL) 150 MG 24 hr tablet 3  qam 90 tablet 3  . Cholecalciferol (VITAMIN D3) 1000 UNIT tablet Take 1,000 Units by mouth daily.      . clonazePAM (KLONOPIN) 0.5 MG tablet TAKE 1 TABLET BY MOUTH UP TO 3 TIMES A DAY AS NEEDED. 90 tablet 0  . clotrimazole-betamethasone (LOTRISONE) cream Apply 1 application topically 2 (two) times daily. 60 g 1  . Cyanocobalamin (VITAMIN B-12 CR) 1000 MCG TBCR  Take 1,000 mcg by mouth daily.      Marland Kitchen docusate sodium 100 MG CAPS Take 100 mg by mouth 2 (two) times daily. 10 capsule 0  . ferrous sulfate 325 (  65 FE) MG tablet Take 1 tablet (325 mg total) by mouth 3 (three) times daily after meals.  3  . FLUoxetine (PROZAC) 40 MG capsule Take 1 capsule (40 mg total) by mouth daily. 90 capsule 2  . gabapentin (NEURONTIN) 300 MG capsule Take 1 capsule (300 mg total) by mouth 3 (three) times daily. 90 capsule 3  . morphine (MS CONTIN) 30 MG 12 hr tablet Take 1 tablet (30 mg total) by mouth every 12 (twelve) hours. 60 tablet 0  . Multiple Vitamin (MULTIVITAMIN) tablet Take 1 tablet by mouth daily.    Marland Kitchen oxyCODONE (ROXICODONE) 15 MG immediate release tablet Take 1 tablet (15 mg total) by mouth every 6 (six) hours as needed for pain. Please fill on or after 10/14/16 120 tablet 0  . polyethylene glycol (MIRALAX / GLYCOLAX) packet Take 17 g by mouth 2 (two) times daily. 14 each 0  . propranolol (INDERAL) 80 MG tablet TAKE 1 TABLET BY MOUTH 3 TIMES DAILY. 270 tablet 1  . testosterone cypionate (DEPOTESTOSTERONE CYPIONATE) 200 MG/ML injection INJECT 3 MILLILITERS (600 MG TOTAL) INTO THE MUSCLE EVERY 14 DAYS. 10 mL 5  . tiZANidine (ZANAFLEX) 4 MG tablet TAKE 1 TABLET BY MOUTH EVERY 8 HOURS AS NEEDED FOR MUSCLE SPASMS. 60 tablet 1  . traZODone (DESYREL) 50 MG tablet Take 1-2 tablets (50-100 mg total) by mouth at bedtime. 60 tablet 5   No current facility-administered medications for this visit.     Neurologic: Headache: No Seizure: No Paresthesias:No  Musculoskeletal: Strength & Muscle Tone: within normal limits Gait & Station: broad based Patient leans: N/A  Psychiatric Specialty Exam: ROS  There were no vitals taken for this visit.There is no height or weight on file to calculate BMI.  General Appearance: Casual  Eye Contact:  Good  Speech:  Clear and Coherent  Volume:  Normal  Mood:  Depressed  Affect:  Constricted  Thought Process:  Coherent   Orientation:  Full (Time, Place, and Person)  Thought Content:  NA  Suicidal Thoughts:  No  Homicidal Thoughts:  No  Memory:  Negative  Judgement:  Fair  Insight:  Fair  Psychomotor Activity:  Normal  Concentration:    Recall:    Fund of Knowledge:Fair  Language: Good  Akathisia:  No  Handed:  Right  AIMS (if indicated):    Assets:  Desire for Improvement  ADL's:  Intact  Cognition: WNL  Sleep:      Treatment Plan Summary: 7/18/20182:31 PM   At this time the patient will continue in therapy he has an appointment coming up in 1-2 weeks. Overtly he denies being so [place] paranoid but I do not believe she's being accurate with me. He shares for the first time he hears voices. At this time we have asked him to discontinue the Klonopin but continue taking the Prozac and Wellbutrin as prescribed. Today when asked him to increase his Abilify from a dose of 5 mg to a higher dose of 10 mg. This patient to return to see me in approximately one month for a 30 minute full visit. The patient is not suicidal. He denies any physical complaints. He has no chest pain or shortness of breath.

## 2016-10-20 ENCOUNTER — Other Ambulatory Visit (HOSPITAL_COMMUNITY): Payer: Self-pay

## 2016-10-20 MED ORDER — ARIPIPRAZOLE 10 MG PO TABS: 5.0000 mg | ORAL_TABLET | Freq: Every day | ORAL | 3 refills | Status: DC

## 2016-10-21 ENCOUNTER — Other Ambulatory Visit (HOSPITAL_COMMUNITY): Payer: Self-pay

## 2016-10-21 MED ORDER — ARIPIPRAZOLE 10 MG PO TABS: 10.0000 mg | ORAL_TABLET | Freq: Every day | ORAL | 3 refills | Status: DC

## 2016-10-21 NOTE — Telephone Encounter (Signed)
Called refill into [facility] spoke w/Stephanie gave MD approval.../lmb

## 2016-10-22 ENCOUNTER — Ambulatory Visit (HOSPITAL_COMMUNITY): Payer: Self-pay | Admitting: Psychology

## 2016-10-22 ENCOUNTER — Other Ambulatory Visit: Payer: Self-pay | Admitting: *Deleted

## 2016-10-22 NOTE — Patient Outreach (Signed)
[facility]) Care Management  10/22/2016  Vernon Bishop 1941/03/05 [id number]   HTA-High Risk Screen  Initial outreach attempt unsuccessful. RN able to leave a HIPAA approved voice message requesting a call back. Will outreach attempts and inquire on the requested screening at that time.  [name], RN Care Management Coordinator [place] Office [phone]

## 2016-10-26 ENCOUNTER — Other Ambulatory Visit (HOSPITAL_COMMUNITY): Payer: Self-pay

## 2016-10-26 MED ORDER — ARIPIPRAZOLE 10 MG PO TABS: 10.0000 mg | ORAL_TABLET | Freq: Every day | ORAL | 3 refills | Status: DC

## 2016-11-02 ENCOUNTER — Encounter: Payer: Self-pay | Admitting: *Deleted

## 2016-11-02 ENCOUNTER — Other Ambulatory Visit: Payer: Self-pay | Admitting: *Deleted

## 2016-11-02 NOTE — Patient Outreach (Signed)
[facility]) Care Management  11/02/2016  Vernon Bishop 08-Dec-1940 [id number]   HTA Screening NO NEEDS  RN spoke with pt today and confirmed identifiers and the purpose for today's call. Screening completed with no needs however depression screening positive with inquire on a new medication that has been prescribed (Abilify). After review of this medication pt states he is aware of what this medications is for and who prescribed it. Based upon the information provided on the depression screening RN inquired if th pt is in needs immediate attention. Pt  declined any assistance with [facility] or emergent services stating he is undergoing counseling and pending an upcoming appointment with his psychiatrist on 8/23. States taking his medications helps and he does not need any additional services at this time. Pt also states he has spoken with his doctor and promised " I will not kill myself". States he "will not go that far" with his depression. Again assistance for services offered as pt continued to declined. RN inquired about any other needs as pt indicates no needs however appreciative for the call today. Offered concierge contact if additional services are needed.    [name], RN Care Management Coordinator [place] Office [phone]

## 2016-11-03 ENCOUNTER — Ambulatory Visit (INDEPENDENT_AMBULATORY_CARE_PROVIDER_SITE_OTHER): Payer: PPO | Admitting: Internal Medicine

## 2016-11-03 ENCOUNTER — Encounter: Payer: Self-pay | Admitting: Internal Medicine

## 2016-11-03 DIAGNOSIS — E538 Deficiency of other specified B group vitamins: Secondary | ICD-10-CM | POA: Diagnosis not present

## 2016-11-03 DIAGNOSIS — D485 Neoplasm of uncertain behavior of skin: Secondary | ICD-10-CM

## 2016-11-03 DIAGNOSIS — F411 Generalized anxiety disorder: Secondary | ICD-10-CM

## 2016-11-03 DIAGNOSIS — M5440 Lumbago with sciatica, unspecified side: Secondary | ICD-10-CM | POA: Diagnosis not present

## 2016-11-03 DIAGNOSIS — E119 Type 2 diabetes mellitus without complications: Secondary | ICD-10-CM

## 2016-11-03 DIAGNOSIS — F22 Delusional disorders: Secondary | ICD-10-CM | POA: Diagnosis not present

## 2016-11-03 DIAGNOSIS — L219 Seborrheic dermatitis, unspecified: Secondary | ICD-10-CM | POA: Diagnosis not present

## 2016-11-03 MED ORDER — OXYCODONE HCL 15 MG PO TABS: 15.0000 mg | ORAL_TABLET | Freq: Four times a day (QID) | ORAL | 0 refills | Status: DC | PRN

## 2016-11-03 MED ORDER — MORPHINE SULFATE ER 30 MG PO TBCR: 30.0000 mg | EXTENDED_RELEASE_TABLET | Freq: Two times a day (BID) | ORAL | 0 refills | Status: DC

## 2016-11-03 MED ORDER — CLOTRIMAZOLE-BETAMETHASONE 1-0.05 % EX CREA: 1.0000 "application " | TOPICAL_CREAM | Freq: Two times a day (BID) | CUTANEOUS | 1 refills | Status: DC

## 2016-11-03 MED ORDER — KETOCONAZOLE 2 % EX SHAM: 1.0000 "application " | MEDICATED_SHAMPOO | CUTANEOUS | 0 refills | Status: DC

## 2016-11-03 NOTE — Assessment & Plan Note (Addendum)
R post thigh with 11x12 lesion, scaly Skin bx

## 2016-11-03 NOTE — Assessment & Plan Note (Signed)
Oxycodone, MS-contin, Tizanidine Chronic Potential benefits of a long term opioids use as well as potential risks (i.e. addiction risk, apnea etc) and complications (i.e. Somnolence, constipation and others) were explained to the patient and were aknowledged.   

## 2016-11-03 NOTE — Assessment & Plan Note (Signed)
On B12 

## 2016-11-03 NOTE — Patient Instructions (Signed)
Skin bx w/me 

## 2016-11-03 NOTE — Assessment & Plan Note (Signed)
Ketoconazole shampoo Lotrisone cream

## 2016-11-03 NOTE — Assessment & Plan Note (Signed)
Seeing Dr Casimiro Needle - on Abilify in place of Clonazepam Wellbutrin XL 350 mg/d

## 2016-11-03 NOTE — Progress Notes (Signed)
Subjective:  Patient ID: Vernon Bishop, male    DOB: 19-Dec-1940  Age: 76 y.o. MRN: [id number]  CC: No chief complaint on file.   HPI Vernon Bishop presents for LBP, anxiety, depression f/u Seeing Dr Casimiro Needle - on Abilify in place of Clonazepam C/o face rash, scalp  Outpatient Medications Prior to Visit  Medication Sig Dispense Refill  . acetaminophen (TYLENOL) 500 MG tablet Take 2 tablets (1,000 mg total) by mouth every 8 (eight) hours as needed for mild pain. 30 tablet 0  . amLODipine (NORVASC) 10 MG tablet TAKE 1 TABLET BY MOUTH EVERY MORNING. 90 tablet 3  . ARIPiprazole (ABILIFY) 10 MG tablet Take 1 tablet (10 mg total) by mouth daily. 30 tablet 3  . aspirin EC 81 MG tablet Take 81 mg by mouth daily.    . benazepril-hydrochlorthiazide (LOTENSIN HCT) 20-12.5 MG per tablet TAKE 2 TABLETS BY MOUTH DAILY. 60 tablet 5  . buPROPion (WELLBUTRIN XL) 150 MG 24 hr tablet 3  qam 90 tablet 3  . Cholecalciferol (VITAMIN D3) 1000 UNIT tablet Take 1,000 Units by mouth daily.      . clotrimazole-betamethasone (LOTRISONE) cream Apply 1 application topically 2 (two) times daily. 60 g 1  . Cyanocobalamin (VITAMIN B-12 CR) 1000 MCG TBCR Take 1,000 mcg by mouth daily.      Marland Kitchen docusate sodium 100 MG CAPS Take 100 mg by mouth 2 (two) times daily. 10 capsule 0  . ferrous sulfate 325 (65 FE) MG tablet Take 1 tablet (325 mg total) by mouth 3 (three) times daily after meals.  3  . FLUoxetine (PROZAC) 40 MG capsule Take 1 capsule (40 mg total) by mouth daily. 90 capsule 2  . gabapentin (NEURONTIN) 300 MG capsule Take 1 capsule (300 mg total) by mouth 3 (three) times daily. 90 capsule 3  . morphine (MS CONTIN) 30 MG 12 hr tablet Take 1 tablet (30 mg total) by mouth every 12 (twelve) hours. 60 tablet 0  . Multiple Vitamin (MULTIVITAMIN) tablet Take 1 tablet by mouth daily.    Marland Kitchen oxyCODONE (ROXICODONE) 15 MG immediate release tablet Take 1 tablet (15 mg total) by mouth every 6 (six) hours as needed for pain.  Please fill on or after 10/14/16 120 tablet 0  . polyethylene glycol (MIRALAX / GLYCOLAX) packet Take 17 g by mouth 2 (two) times daily. 14 each 0  . propranolol (INDERAL) 80 MG tablet TAKE 1 TABLET BY MOUTH 3 TIMES DAILY. 270 tablet 1  . testosterone cypionate (DEPOTESTOSTERONE CYPIONATE) 200 MG/ML injection INJECT 3 MILLILITERS (600 MG TOTAL) INTO THE MUSCLE EVERY 14 DAYS. 10 mL 5  . tiZANidine (ZANAFLEX) 4 MG tablet TAKE 1 TABLET BY MOUTH EVERY 8 HOURS AS NEEDED FOR MUSCLE SPASMS. 60 tablet 1  . traZODone (DESYREL) 50 MG tablet Take 1-2 tablets (50-100 mg total) by mouth at bedtime. 60 tablet 5  . clonazePAM (KLONOPIN) 0.5 MG tablet TAKE 1 TABLET BY MOUTH UP TO 3 TIMES A DAY AS NEEDED 90 tablet 0   No facility-administered medications prior to visit.     ROS Review of Systems  Constitutional: Positive for fatigue. Negative for appetite change and unexpected weight change.  HENT: Negative for congestion, nosebleeds, sneezing, sore throat and trouble swallowing.   Eyes: Negative for itching and visual disturbance.  Respiratory: Negative for cough.   Cardiovascular: Negative for chest pain, palpitations and leg swelling.  Gastrointestinal: Negative for abdominal distention, blood in stool, diarrhea and nausea.  Genitourinary: Negative for frequency and  hematuria.  Musculoskeletal: Positive for back pain. Negative for gait problem, joint swelling and neck pain.  Skin: Positive for rash.  Neurological: Negative for dizziness, tremors, speech difficulty and weakness.  Psychiatric/Behavioral: Negative for agitation, dysphoric mood and sleep disturbance. The patient is not nervous/anxious.     Objective:  BP 138/70 (BP Location: Left Arm, Patient Position: Sitting, Cuff Size: Large)   Pulse (!) 49   Temp 98.7 F (37.1 C) (Oral)   Ht 5\' 6"  (1.676 m)   Wt 228 lb (103.4 kg)   SpO2 99%   BMI 36.80 kg/m   BP Readings from Last 3 Encounters:  11/03/16 138/70  08/03/16 140/80  05/06/16  140/78    Wt Readings from Last 3 Encounters:  11/03/16 228 lb (103.4 kg)  08/03/16 224 lb 1.9 oz (101.7 kg)  05/06/16 232 lb 12 oz (105.6 kg)    Physical Exam  Constitutional: He is oriented to person, place, and time. He appears well-developed. No distress.  NAD  HENT:  Mouth/Throat: Oropharynx is clear and moist.  Eyes: Pupils are equal, round, and reactive to light. Conjunctivae are normal.  Neck: Normal range of motion. No JVD present. No thyromegaly present.  Cardiovascular: Normal rate, regular rhythm, normal heart sounds and intact distal pulses.  Exam reveals no gallop and no friction rub.   No murmur heard. Pulmonary/Chest: Effort normal and breath sounds normal. No respiratory distress. He has no wheezes. He has no rales. He exhibits no tenderness.  Abdominal: Soft. Bowel sounds are normal. He exhibits no distension and no mass. There is no tenderness. There is no rebound and no guarding.  Musculoskeletal: Normal range of motion. He exhibits tenderness. He exhibits no edema.  Lymphadenopathy:    He has no cervical adenopathy.  Neurological: He is alert and oriented to person, place, and time. He has normal reflexes. No cranial nerve deficit. He exhibits normal muscle tone. He displays a negative Romberg sign. Coordination and gait normal.  Skin: Skin is warm and dry. Rash noted. There is erythema.  Psychiatric: He has a normal mood and affect. His behavior is normal. Judgment and thought content normal.  face w/erythema dandruff R post thigh with 11x12 lesion, scaly  Lab Results  Component Value Date   WBC 7.7 02/03/2016   HGB 16.3 02/03/2016   HCT 47.8 02/03/2016   PLT 218.0 02/03/2016   GLUCOSE 74 02/03/2016   CHOL 136 02/03/2016   TRIG 133.0 02/03/2016   HDL 31.60 (L) 02/03/2016   LDLCALC 78 02/03/2016   ALT 15 02/03/2016   AST 15 02/03/2016   NA 139 02/03/2016   K 3.8 02/03/2016   CL 104 02/03/2016   CREATININE 0.89 02/03/2016   BUN 14 02/03/2016   CO2  28 02/03/2016   TSH 1.36 02/03/2016   PSA 0.81 02/03/2016   INR 1.00 03/07/2013   HGBA1C 5.1 01/10/2014    Dg Hip Unilat With Pelvis 2-3 Views Left  Result Date: 05/06/2016 CLINICAL DATA:  Fall 1 month ago with persistent left hip pain EXAM: DG HIP (WITH OR WITHOUT PELVIS) 2-3V LEFT COMPARISON:  AP pelvis x-ray dated 14 March 2013 FINDINGS: The bony pelvis is subjectively adequately mineralized. No acute fracture is observed. The observed portions of the sacrum are grossly normal. 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 On the left the hip joint spaces reasonably well-maintained. There is some scleroses of the inferior aspect of the acetabulum. The femoral head and acetabular articular surfaces remain smooth. The femoral neck, intertrochanteric, and  subtrochanteric regions are normal. IMPRESSION: There is mild degenerative change of the left hip without significant joint space loss. There is no acute or healing fracture or dislocation. Electronically Signed   By: David  Martinique M.D.   On: 05/06/2016 15:05    Assessment & Plan:   There are no diagnoses linked to this encounter. I have discontinued Mr. Logan clonazePAM. I am also having him maintain his Vitamin B-12 CR, cholecalciferol, DSS, ferrous sulfate, polyethylene glycol, acetaminophen, benazepril-hydrochlorthiazide, aspirin EC, multivitamin, propranolol, gabapentin, clotrimazole-betamethasone, tiZANidine, amLODipine, FLUoxetine, traZODone, morphine, oxyCODONE, testosterone cypionate, buPROPion, and ARIPiprazole.  No orders of the defined types were placed in this encounter.    Follow-up: No Follow-up on file.  Walker Kehr, MD

## 2016-11-03 NOTE — Assessment & Plan Note (Signed)
labs

## 2016-11-12 ENCOUNTER — Other Ambulatory Visit: Payer: Self-pay | Admitting: Internal Medicine

## 2016-11-19 ENCOUNTER — Ambulatory Visit (INDEPENDENT_AMBULATORY_CARE_PROVIDER_SITE_OTHER): Payer: PPO | Admitting: Psychiatry

## 2016-11-19 ENCOUNTER — Encounter (HOSPITAL_COMMUNITY): Payer: Self-pay | Admitting: Psychiatry

## 2016-11-19 VITALS — BP 152/74 | HR 54 | Ht 72.0 in | Wt 229.2 lb

## 2016-11-19 DIAGNOSIS — R451 Restlessness and agitation: Secondary | ICD-10-CM

## 2016-11-19 DIAGNOSIS — F323 Major depressive disorder, single episode, severe with psychotic features: Secondary | ICD-10-CM

## 2016-11-19 DIAGNOSIS — Z87891 Personal history of nicotine dependence: Secondary | ICD-10-CM

## 2016-11-19 MED ORDER — BUPROPION HCL ER (XL) 150 MG PO TB24: ORAL_TABLET | ORAL | 5 refills | Status: DC

## 2016-11-19 MED ORDER — FLUOXETINE HCL 40 MG PO CAPS: 40.0000 mg | ORAL_CAPSULE | Freq: Every day | ORAL | 2 refills | Status: DC

## 2016-11-19 NOTE — Progress Notes (Signed)
The abdomen second the  Psychiatric Initial Adult Assessment   Patient Identification: Vernon Bishop MRN:  [id number] Date of Evaluation:  11/19/2016 Referral Source: Dr. Marianne Sofia Chief Complaint:    Visit Diagnosis: No diagnosis found.  History of Present Illness:   Today the patient is seen one time. Today the patient Vernon Bishop says he's better. He is less depressed. He sleeping and eating fairly well. He says his got a stop the Abilify because is making him agitated. He continues taking Neurontin from some another provider and continues Prozac and well as prescribed. Overall though he says his only problem is that of concentration. Otherwise he enjoys television. He is less preoccupied by his wife and her having an affair. He says overall voices been present for years and he seem a little bit better. He hears it sounds like a radio signal and hears it only at night for short. Time. There are multiple voices can make out what they say. Once again he's been having this. Years. Patient is not suicidal. He is sleeping and eating well. Scattered reason amount of energy. Psychotic Symptoms:   PTSD Symptoms:   Past Psychiatric History: Psychiatric hospitalization on time and to antidepressants  Previous Psychotropic Medications: Yes   Substance Abuse History in the last 12 months:  No.  Consequences of Substance Abuse:   Past Medical History:  Past Medical History:  Diagnosis Date  . Atrial fibrillation ([place])    1992  . DDD (degenerative disc disease)   . DDD (degenerative disc disease)   . Depression   . Diabetes mellitus    type II  - PT STATES NOT ON ANY DIABETIC MEDICINES  . ED (erectile dysfunction)   . Hyperlipidemia   . Hypertension   . Hypogonadism male   . LBP (low back pain)   . MVP (mitral valve prolapse)   . OSA on CPAP   . Osteoarthritis   . Osteoarthritis, hand    SEVERE LOWER BACK PAIN, AND RESTLESS LEG SYNDROME  . Palpitations   . PTSD (post-traumatic stress  disorder)   . Swallowing problem    HX OF PILLS GETTING "STUCK" IN THROAT AT TIMES  . Vitamin D deficiency     Past Surgical History:  Procedure Laterality Date  . APPENDECTOMY    . BACK SURGERY  1987  . CERVICAL LAMINECTOMY  2004   [name], rod was placed- HAS ROM LIMITATIONS  . CHOLECYSTECTOMY    . COLONOSCOPY    . KNEE SURGERY     left knee/arthroscopic  . TOTAL HIP ARTHROPLASTY Right 03/14/2013   Procedure: RIGHT TOTAL HIP ARTHROPLASTY ANTERIOR APPROACH;  Surgeon: [name], MD;  Location: [facility];  Service: Orthopedics;  Laterality: Right;    Family Psychiatric History:   Family History:  Family History  Problem Relation Age of Onset  . Allergies Mother   . Stroke Mother        Clotting disorders  . Heart disease Mother   . Heart disease Father   . Heart attack Father 66  . Heart disease Brother   . Rheum arthritis Paternal Grandmother   . Other Paternal Grandmother        Rheumatism  . Coronary artery disease Other   . Diabetes Other   . Hypertension Other   . Esophageal cancer Maternal Aunt   . Colon cancer Neg Hx   . Rectal cancer Neg Hx   . Stomach cancer Neg Hx     Social History:   Social History  Social History  . Marital status: Married    Spouse name: N/A  . Number of children: 4  . Years of education: N/A   Occupational History  . Retired- Retired    Programmer, applications   Social History Main Topics  . Smoking status: Former Smoker    Packs/day: 0.50    Years: 12.00    Types: Cigarettes    Quit date: 03/31/1971  . Smokeless tobacco: Never Used     Comment: Started at age 13  . Alcohol use 1.2 oz/week    2 Cans of beer per week     Comment: occasionally  . Drug use: No  . Sexual activity: Yes   Other Topics Concern  . None   Social History Narrative   FAMILY HISTORY   History of CAD Male 1st degree relative <50   History Diabetes 1st degree relative   History hypertension                Additional Social History:    Allergies:   Allergies  Allergen Reactions  . Cymbalta [Duloxetine Hcl]     constipation  . Digoxin And Related     HR 41  . Duloxetine     REACTION: constipation  . Prednisolone     Edema, wt gain  . Temazepam     Metabolic Disorder Labs: Lab Results  Component Value Date   HGBA1C 5.1 01/10/2014   No results found for: PROLACTIN Lab Results  Component Value Date   CHOL 136 02/03/2016   TRIG 133.0 02/03/2016   HDL 31.60 (L) 02/03/2016   CHOLHDL 4 02/03/2016   VLDL 26.6 02/03/2016   LDLCALC 78 02/03/2016   LDLCALC 93 02/13/2015     Current Medications: Current Outpatient Prescriptions  Medication Sig Dispense Refill  . acetaminophen (TYLENOL) 500 MG tablet Take 2 tablets (1,000 mg total) by mouth every 8 (eight) hours as needed for mild pain. 30 tablet 0  . amLODipine (NORVASC) 10 MG tablet TAKE 1 TABLET BY MOUTH EVERY MORNING. 90 tablet 3  . ARIPiprazole (ABILIFY) 10 MG tablet Take 1 tablet (10 mg total) by mouth daily. 30 tablet 3  . aspirin EC 81 MG tablet Take 81 mg by mouth daily.    . benazepril-hydrochlorthiazide (LOTENSIN HCT) 20-12.5 MG per tablet TAKE 2 TABLETS BY MOUTH DAILY. 60 tablet 5  . buPROPion (WELLBUTRIN XL) 150 MG 24 hr tablet 3  qam 90 tablet 5  . Cholecalciferol (VITAMIN D3) 1000 UNIT tablet Take 1,000 Units by mouth daily.      . clotrimazole-betamethasone (LOTRISONE) cream Apply 1 application topically 2 (two) times daily. 60 g 1  . Cyanocobalamin (VITAMIN B-12 CR) 1000 MCG TBCR Take 1,000 mcg by mouth daily.      Marland Kitchen docusate sodium 100 MG CAPS Take 100 mg by mouth 2 (two) times daily. 10 capsule 0  . ferrous sulfate 325 (65 FE) MG tablet Take 1 tablet (325 mg total) by mouth 3 (three) times daily after meals.  3  . FLUoxetine (PROZAC) 40 MG capsule Take 1 capsule (40 mg total) by mouth daily. 90 capsule 2  . gabapentin (NEURONTIN) 300 MG capsule TAKE 1 CAPSULE BY MOUTH 3 TIMES DAILY. 90 capsule 0  . ketoconazole (NIZORAL) 2 % shampoo Apply 1  application topically 2 (two) times a week. 120 mL 0  . morphine (MS CONTIN) 30 MG 12 hr tablet Take 1 tablet (30 mg total) by mouth every 12 (twelve) hours. 60 tablet 0  .  Multiple Vitamin (MULTIVITAMIN) tablet Take 1 tablet by mouth daily.    Marland Kitchen oxyCODONE (ROXICODONE) 15 MG immediate release tablet Take 1 tablet (15 mg total) by mouth every 6 (six) hours as needed for pain. Please fill on or after 12/15/16 120 tablet 0  . polyethylene glycol (MIRALAX / GLYCOLAX) packet Take 17 g by mouth 2 (two) times daily. 14 each 0  . propranolol (INDERAL) 80 MG tablet TAKE 1 TABLET BY MOUTH 3 TIMES DAILY. 270 tablet 1  . testosterone cypionate (DEPOTESTOSTERONE CYPIONATE) 200 MG/ML injection INJECT 3 MILLILITERS (600 MG TOTAL) INTO THE MUSCLE EVERY 14 DAYS. 10 mL 5  . tiZANidine (ZANAFLEX) 4 MG tablet TAKE 1 TABLET BY MOUTH EVERY 8 HOURS AS NEEDED FOR MUSCLE SPASMS. 60 tablet 1  . traZODone (DESYREL) 50 MG tablet Take 1-2 tablets (50-100 mg total) by mouth at bedtime. 60 tablet 5   No current facility-administered medications for this visit.     Neurologic: Headache: No Seizure: No Paresthesias:No  Musculoskeletal: Strength & Muscle Tone: within normal limits Gait & Station: broad based Patient leans: N/A  Psychiatric Specialty Exam: ROS  Blood pressure (!) 152/74, pulse (!) 54, height 6' (1.829 m), weight 229 lb 3.2 oz (104 kg).Body mass index is 31.09 kg/m.  General Appearance: Casual  Eye Contact:  Good  Speech:  Clear and Coherent  Volume:  Normal  Mood:  Depressed  Affect:  Constricted  Thought Process:  Coherent  Orientation:  Full (Time, Place, and Person)  Thought Content:  NA  Suicidal Thoughts:  No  Homicidal Thoughts:  No  Memory:  Negative  Judgement:  Fair  Insight:  Fair  Psychomotor Activity:  Normal  Concentration:    Recall:    Fund of Knowledge:Fair  Language: Good  Akathisia:  No  Handed:  Right  AIMS (if indicated):    Assets:  Desire for Improvement  ADL's:   Intact  Cognition: WNL  Sleep:      Treatment Plan Summary: 8/23/20184:50 PM   At this time we'll go ahead and discontinue his Abilify. I'm not sure that he has a distinct distressing form of agitated psychosis. Patient is 6 and concerned about using an antipsychotic medicine unless it's necessary. Should note the patient actually is better voices are less of a problem and his depression is better. It may be in fact due to the Abilify. The possibility of changing to Rexul is a consideration. At this time we'll stop the Abilify and asked him to come back to see me in 2 months. Continue all his antidepressants. The patient is not suicidal. The patient drinks no alcohol. He uses no drugs.ti

## 2016-12-09 ENCOUNTER — Ambulatory Visit (INDEPENDENT_AMBULATORY_CARE_PROVIDER_SITE_OTHER): Payer: PPO | Admitting: Psychology

## 2016-12-09 DIAGNOSIS — F331 Major depressive disorder, recurrent, moderate: Secondary | ICD-10-CM | POA: Diagnosis not present

## 2016-12-09 NOTE — Progress Notes (Signed)
   THERAPIST PROGRESS NOTE  Session Time: 10am-10.43am   Participation Level: Active  Behavioral Response: Well GroomedAlertDepressed  Type of Therapy: Individual Therapy  Treatment Goals addressed: Diagnosis: MDD and goal 1.  Interventions: CBT and Supportive  Summary: Vernon Bishop is a 76 y.o. male who presents with affect brighter.  Pt reported that he is doing better, not hearing things, getting out of his "corner more" and getting out of the house.  Pt reported positive interactions w/ his sons.  Pt was able to share memories for his childhood and more positive focus.  pt reported that will go out occassionally w/ wife- but still misses close relationship that have. Pt discussed other ways they could interact and how he can initiate.  Pt is not obsessing about wife and belief of affair.  Pt reported that he hadn't planned to continue counseling as feels that he is doing well and nothing further to talk about.  Pt agrees that if needs can come back to seek counseling.   Suicidal/Homicidal: Nowithout intent/plan  Therapist Response: Assessed pt current functioning per pt report. Processed w/pt impovments in mood and activities to continue progress.  Explored w/ pt interactions w/ family/wife and how to further initiate.  Discussed w/ pt need for transfer to LCSW in clinic for credentialing issues.    Plan: Return again to f/u w/ Dr.  Casimiro Needle.  Pt is able to continue counseling w/ [name] in this practice.  Pt declines and reports that he is meeting his goals.   Diagnosis: MDD, moderate  YATES,LEANNE, LPC 12/09/2016

## 2016-12-28 ENCOUNTER — Other Ambulatory Visit: Payer: PPO

## 2016-12-30 ENCOUNTER — Encounter: Payer: Self-pay | Admitting: Internal Medicine

## 2016-12-30 ENCOUNTER — Ambulatory Visit (INDEPENDENT_AMBULATORY_CARE_PROVIDER_SITE_OTHER): Payer: PPO | Admitting: Internal Medicine

## 2016-12-30 ENCOUNTER — Other Ambulatory Visit (INDEPENDENT_AMBULATORY_CARE_PROVIDER_SITE_OTHER): Payer: PPO

## 2016-12-30 VITALS — BP 142/70 | HR 41 | Temp 98.1°F | Ht 66.0 in | Wt 234.0 lb

## 2016-12-30 DIAGNOSIS — I480 Paroxysmal atrial fibrillation: Secondary | ICD-10-CM | POA: Diagnosis not present

## 2016-12-30 DIAGNOSIS — E119 Type 2 diabetes mellitus without complications: Secondary | ICD-10-CM

## 2016-12-30 DIAGNOSIS — F339 Major depressive disorder, recurrent, unspecified: Secondary | ICD-10-CM

## 2016-12-30 DIAGNOSIS — F22 Delusional disorders: Secondary | ICD-10-CM

## 2016-12-30 DIAGNOSIS — Z23 Encounter for immunization: Secondary | ICD-10-CM

## 2016-12-30 DIAGNOSIS — I10 Essential (primary) hypertension: Secondary | ICD-10-CM

## 2016-12-30 DIAGNOSIS — E291 Testicular hypofunction: Secondary | ICD-10-CM

## 2016-12-30 LAB — CBC WITH DIFFERENTIAL/PLATELET
BASOS ABS: 0 10*3/uL (ref 0.0–0.1)
BASOS PCT: 1 % (ref 0.0–3.0)
EOS ABS: 0.1 10*3/uL (ref 0.0–0.7)
Eosinophils Relative: 2.1 % (ref 0.0–5.0)
HCT: 43.5 % (ref 39.0–52.0)
Hemoglobin: 14.9 g/dL (ref 13.0–17.0)
LYMPHS ABS: 1.5 10*3/uL (ref 0.7–4.0)
Lymphocytes Relative: 31.7 % (ref 12.0–46.0)
MCHC: 34.2 g/dL (ref 30.0–36.0)
MCV: 92 fl (ref 78.0–100.0)
MONOS PCT: 10.8 % (ref 3.0–12.0)
Monocytes Absolute: 0.5 10*3/uL (ref 0.1–1.0)
NEUTROS ABS: 2.6 10*3/uL (ref 1.4–7.7)
NEUTROS PCT: 54.4 % (ref 43.0–77.0)
PLATELETS: 188 10*3/uL (ref 150.0–400.0)
RBC: 4.73 Mil/uL (ref 4.22–5.81)
RDW: 13.8 % (ref 11.5–15.5)
WBC: 4.8 10*3/uL (ref 4.0–10.5)

## 2016-12-30 LAB — TSH: TSH: 2.64 u[IU]/mL (ref 0.35–4.50)

## 2016-12-30 LAB — BASIC METABOLIC PANEL
BUN: 16 mg/dL (ref 6–23)
CALCIUM: 9 mg/dL (ref 8.4–10.5)
CO2: 30 meq/L (ref 19–32)
CREATININE: 1.07 mg/dL (ref 0.40–1.50)
Chloride: 102 mEq/L (ref 96–112)
GFR: 71.4 mL/min (ref >60.00)
GLUCOSE: 99 mg/dL (ref 70–99)
Potassium: 4.1 mEq/L (ref 3.5–5.1)
Sodium: 139 mEq/L (ref 135–145)

## 2016-12-30 LAB — HEPATIC FUNCTION PANEL
ALBUMIN: 4.1 g/dL (ref 3.5–5.2)
ALT: 12 U/L (ref 0–53)
AST: 13 U/L (ref 0–37)
Alkaline Phosphatase: 97 U/L (ref 39–117)
BILIRUBIN TOTAL: 0.5 mg/dL (ref 0.2–1.2)
Bilirubin, Direct: 0.1 mg/dL (ref 0.0–0.3)
Total Protein: 6.6 g/dL (ref 6.0–8.3)

## 2016-12-30 LAB — TESTOSTERONE: TESTOSTERONE: 777.06 ng/dL (ref 300.00–890.00)

## 2016-12-30 LAB — PSA: PSA: 0.45 ng/mL (ref 0.10–4.00)

## 2016-12-30 LAB — URIC ACID: URIC ACID, SERUM: 5.7 mg/dL (ref 4.0–7.8)

## 2016-12-30 MED ORDER — MORPHINE SULFATE ER 30 MG PO TBCR: 30.0000 mg | EXTENDED_RELEASE_TABLET | Freq: Two times a day (BID) | ORAL | 0 refills | Status: DC

## 2016-12-30 MED ORDER — OXYCODONE HCL 15 MG PO TABS: 15.0000 mg | ORAL_TABLET | Freq: Four times a day (QID) | ORAL | 0 refills | Status: DC | PRN

## 2016-12-30 NOTE — Assessment & Plan Note (Signed)
ASA

## 2016-12-30 NOTE — Progress Notes (Signed)
Subjective:  Patient ID: Vernon Bishop, male    DOB: 05-15-1940  Age: 76 y.o. MRN: [id number]  CC: No chief complaint on file.   HPI Vernon Bishop presents for LBP, depression, HTN f/u  Outpatient Medications Prior to Visit  Medication Sig Dispense Refill  . acetaminophen (TYLENOL) 500 MG tablet Take 2 tablets (1,000 mg total) by mouth every 8 (eight) hours as needed for mild pain. 30 tablet 0  . amLODipine (NORVASC) 10 MG tablet TAKE 1 TABLET BY MOUTH EVERY MORNING. 90 tablet 3  . aspirin EC 81 MG tablet Take 81 mg by mouth daily.    . benazepril-hydrochlorthiazide (LOTENSIN HCT) 20-12.5 MG per tablet TAKE 2 TABLETS BY MOUTH DAILY. 60 tablet 5  . buPROPion (WELLBUTRIN XL) 150 MG 24 hr tablet 3  qam 90 tablet 5  . Cholecalciferol (VITAMIN D3) 1000 UNIT tablet Take 1,000 Units by mouth daily.      . clotrimazole-betamethasone (LOTRISONE) cream Apply 1 application topically 2 (two) times daily. 60 g 1  . Cyanocobalamin (VITAMIN B-12 CR) 1000 MCG TBCR Take 1,000 mcg by mouth daily.      Marland Kitchen docusate sodium 100 MG CAPS Take 100 mg by mouth 2 (two) times daily. 10 capsule 0  . ferrous sulfate 325 (65 FE) MG tablet Take 1 tablet (325 mg total) by mouth 3 (three) times daily after meals.  3  . FLUoxetine (PROZAC) 40 MG capsule Take 1 capsule (40 mg total) by mouth daily. 90 capsule 2  . gabapentin (NEURONTIN) 300 MG capsule TAKE 1 CAPSULE BY MOUTH 3 TIMES DAILY. 90 capsule 0  . ketoconazole (NIZORAL) 2 % shampoo Apply 1 application topically 2 (two) times a week. 120 mL 0  . morphine (MS CONTIN) 30 MG 12 hr tablet Take 1 tablet (30 mg total) by mouth every 12 (twelve) hours. 60 tablet 0  . Multiple Vitamin (MULTIVITAMIN) tablet Take 1 tablet by mouth daily.    Marland Kitchen oxyCODONE (ROXICODONE) 15 MG immediate release tablet Take 1 tablet (15 mg total) by mouth every 6 (six) hours as needed for pain. Please fill on or after 12/15/16 120 tablet 0  . polyethylene glycol (MIRALAX / GLYCOLAX) packet Take 17  g by mouth 2 (two) times daily. 14 each 0  . propranolol (INDERAL) 80 MG tablet TAKE 1 TABLET BY MOUTH 3 TIMES DAILY. 270 tablet 1  . testosterone cypionate (DEPOTESTOSTERONE CYPIONATE) 200 MG/ML injection INJECT 3 MILLILITERS (600 MG TOTAL) INTO THE MUSCLE EVERY 14 DAYS. 10 mL 5  . tiZANidine (ZANAFLEX) 4 MG tablet TAKE 1 TABLET BY MOUTH EVERY 8 HOURS AS NEEDED FOR MUSCLE SPASMS. 60 tablet 1  . traZODone (DESYREL) 50 MG tablet Take 1-2 tablets (50-100 mg total) by mouth at bedtime. 60 tablet 5  . ARIPiprazole (ABILIFY) 10 MG tablet Take 1 tablet (10 mg total) by mouth daily. (Patient not taking: Reported on 12/09/2016) 30 tablet 3   No facility-administered medications prior to visit.     ROS Review of Systems  Constitutional: Positive for fatigue. Negative for appetite change and unexpected weight change.  HENT: Negative for congestion, nosebleeds, sneezing, sore throat and trouble swallowing.   Eyes: Negative for itching and visual disturbance.  Respiratory: Negative for cough.   Cardiovascular: Negative for chest pain, palpitations and leg swelling.  Gastrointestinal: Negative for abdominal distention, blood in stool, diarrhea and nausea.  Genitourinary: Negative for frequency and hematuria.  Musculoskeletal: Positive for arthralgias, back pain and gait problem. Negative for joint swelling and  neck pain.  Skin: Negative for rash.  Neurological: Negative for dizziness, tremors, speech difficulty and weakness.  Psychiatric/Behavioral: Positive for dysphoric mood and sleep disturbance. Negative for agitation and suicidal ideas. The patient is nervous/anxious.     Objective:  BP (!) 142/70 (BP Location: Left Arm, Patient Position: Sitting, Cuff Size: Large)   Pulse (!) 41   Temp 98.1 F (36.7 C) (Oral)   Ht 5\' 6"  (1.676 m)   Wt 234 lb (106.1 kg)   SpO2 98%   BMI 37.77 kg/m   BP Readings from Last 3 Encounters:  12/30/16 (!) 142/70  11/03/16 138/70  08/03/16 140/80    Wt  Readings from Last 3 Encounters:  12/30/16 234 lb (106.1 kg)  11/03/16 228 lb (103.4 kg)  08/03/16 224 lb 1.9 oz (101.7 kg)    Physical Exam  Constitutional: He is oriented to person, place, and time. He appears well-developed. No distress.  NAD  HENT:  Mouth/Throat: Oropharynx is clear and moist.  Eyes: Pupils are equal, round, and reactive to light. Conjunctivae are normal.  Neck: Normal range of motion. No JVD present. No thyromegaly present.  Cardiovascular: Normal rate, regular rhythm, normal heart sounds and intact distal pulses.  Exam reveals no gallop and no friction rub.   No murmur heard. Pulmonary/Chest: Effort normal and breath sounds normal. No respiratory distress. He has no wheezes. He has no rales. He exhibits no tenderness.  Abdominal: Soft. Bowel sounds are normal. He exhibits no distension and no mass. There is no tenderness. There is no rebound and no guarding.  Musculoskeletal: Normal range of motion. He exhibits tenderness. He exhibits no edema.  Lymphadenopathy:    He has no cervical adenopathy.  Neurological: He is alert and oriented to person, place, and time. He has normal reflexes. No cranial nerve deficit. He exhibits normal muscle tone. He displays a negative Romberg sign. Coordination and gait normal.  Skin: Skin is warm and dry. No rash noted.  Psychiatric: He has a normal mood and affect. His behavior is normal. Judgment and thought content normal.  LS tender w/ROM  Lab Results  Component Value Date   WBC 7.7 02/03/2016   HGB 16.3 02/03/2016   HCT 47.8 02/03/2016   PLT 218.0 02/03/2016   GLUCOSE 74 02/03/2016   CHOL 136 02/03/2016   TRIG 133.0 02/03/2016   HDL 31.60 (L) 02/03/2016   LDLCALC 78 02/03/2016   ALT 15 02/03/2016   AST 15 02/03/2016   NA 139 02/03/2016   K 3.8 02/03/2016   CL 104 02/03/2016   CREATININE 0.89 02/03/2016   BUN 14 02/03/2016   CO2 28 02/03/2016   TSH 1.36 02/03/2016   PSA 0.81 02/03/2016   INR 1.00 03/07/2013    HGBA1C 5.1 01/10/2014    Dg Hip Unilat With Pelvis 2-3 Views Left  Result Date: 05/06/2016 CLINICAL DATA:  Fall 1 month ago with persistent left hip pain EXAM: DG HIP (WITH OR WITHOUT PELVIS) 2-3V LEFT COMPARISON:  AP pelvis x-ray dated 14 March 2013 FINDINGS: The bony pelvis is subjectively adequately mineralized. No acute fracture is observed. The observed portions of the sacrum are grossly normal. 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 On the left the hip joint spaces reasonably well-maintained. There is some scleroses of the inferior aspect of the acetabulum. The femoral head and acetabular articular surfaces remain smooth. The femoral neck, intertrochanteric, and subtrochanteric regions are normal. IMPRESSION: There is mild degenerative change of the left hip without significant joint space loss.  There is no acute or healing fracture or dislocation. Electronically Signed   By: David  Martinique M.D.   On: 05/06/2016 15:05    Assessment & Plan:   There are no diagnoses linked to this encounter. I have discontinued Mr. Mathey ARIPiprazole. I am also having him maintain his Vitamin B-12 CR, cholecalciferol, DSS, ferrous sulfate, polyethylene glycol, acetaminophen, benazepril-hydrochlorthiazide, aspirin EC, multivitamin, propranolol, tiZANidine, amLODipine, traZODone, testosterone cypionate, clotrimazole-betamethasone, ketoconazole, morphine, oxyCODONE, gabapentin, buPROPion, and FLUoxetine.  No orders of the defined types were placed in this encounter.    Follow-up: No Follow-up on file.  Walker Kehr, MD

## 2016-12-30 NOTE — Assessment & Plan Note (Signed)
On diet Labs 

## 2016-12-30 NOTE — Assessment & Plan Note (Signed)
Lotensin HCT, Amlodipine, Inderal 

## 2016-12-30 NOTE — Addendum Note (Signed)
Addended by: Aviva Signs M on: 12/30/2016 04:59 PM   Modules accepted: Orders

## 2016-12-30 NOTE — Assessment & Plan Note (Addendum)
On Fluoxetine, Wellbutrin XL 450 mg/d

## 2016-12-30 NOTE — Assessment & Plan Note (Addendum)
F/u w/Dr Plovsky  On Fluoxetine, Wellbutrin XL 450 mg/d

## 2017-01-28 ENCOUNTER — Other Ambulatory Visit: Payer: Self-pay | Admitting: Internal Medicine

## 2017-01-30 ENCOUNTER — Other Ambulatory Visit: Payer: Self-pay | Admitting: Internal Medicine

## 2017-02-08 ENCOUNTER — Encounter (HOSPITAL_COMMUNITY): Payer: Self-pay | Admitting: Psychology

## 2017-02-10 ENCOUNTER — Ambulatory Visit (HOSPITAL_COMMUNITY): Payer: Self-pay | Admitting: Psychiatry

## 2017-02-15 ENCOUNTER — Telehealth: Payer: Self-pay | Admitting: Internal Medicine

## 2017-02-15 NOTE — Telephone Encounter (Signed)
02/14/2017 @ 3:35pm [place]: Ebony Hail at [facility] (phone # [phone]) called needing clarification on Morphine and Oxycodone for the pt. Please advise.

## 2017-02-16 NOTE — Telephone Encounter (Signed)
Called [facility] and stated pt has not had med filled since beginning of 2018 and medication is not longer there.

## 2017-04-01 ENCOUNTER — Other Ambulatory Visit: Payer: Self-pay | Admitting: Internal Medicine

## 2017-04-01 ENCOUNTER — Other Ambulatory Visit (HOSPITAL_COMMUNITY): Payer: Self-pay | Admitting: Psychiatry

## 2017-04-01 NOTE — Progress Notes (Addendum)
Subjective:   Vernon Bishop is a 77 y.o. male who presents for Medicare Annual/Subsequent preventive examination.  Review of Systems:  No ROS.  Medicare Wellness Visit. Additional risk factors are reflected in the social history.  Cardiac Risk Factors include: advanced age (>50men, >3 women);diabetes mellitus;dyslipidemia;hypertension;male gender Sleep patterns: feels rested on waking, gets up 1 times nightly to void and sleeps 6-7 hours nightly.   Home Safety/Smoke Alarms: Feels safe in home. Smoke alarms in place.  Living environment; residence and Firearm Safety: 1-story house/ trailer, no firearms. Lives with wife no needs for DME, good support system Seat Belt Safety/Bike Helmet: Wears seat belt.      Objective:    Vitals: There were no vitals taken for this visit.  There is no height or weight on file to calculate BMI.  Advanced Directives 04/02/2017 01/02/2015 03/14/2013 03/07/2013  Does Patient Have a Medical Advance Directive? Yes Yes Patient has advance directive, copy not in chart Patient has advance directive, copy not in chart  Type of Advance Directive [place];Living will - [place];Living will [place];Living will  Does patient want to make changes to medical advance directive? - - No -  Copy of [place] in Chart? No - copy requested - Copy requested from family Copy requested from family  Would patient like information on creating a medical advance directive? - Yes - Educational materials given - -  Pre-existing out of facility DNR order (yellow form or pink MOST form) - - No -  Some encounter information is confidential and restricted. Go to Review Flowsheets activity to see all data.    Tobacco Social History   Tobacco Use  Smoking Status Former Smoker  . Packs/day: 0.50  . Years: 12.00  . Pack years: 6.00  . Types: Cigarettes  . Last attempt to quit: 03/31/1971  . Years since  quitting: 46.0  Smokeless Tobacco Never Used  Tobacco Comment   Started at age 32     Counseling given: Not Answered Comment: Started at age 68  Past Medical History:  Diagnosis Date  . Atrial fibrillation ([place])    1992  . DDD (degenerative disc disease)   . DDD (degenerative disc disease)   . Depression   . Diabetes mellitus    type II  - PT STATES NOT ON ANY DIABETIC MEDICINES  . ED (erectile dysfunction)   . Hyperlipidemia   . Hypertension   . Hypogonadism male   . LBP (low back pain)   . MVP (mitral valve prolapse)   . OSA on CPAP   . Osteoarthritis   . Osteoarthritis, hand    SEVERE LOWER BACK PAIN, AND RESTLESS LEG SYNDROME  . Palpitations   . PTSD (post-traumatic stress disorder)   . Swallowing problem    HX OF PILLS GETTING "STUCK" IN THROAT AT TIMES  . Vitamin D deficiency    Past Surgical History:  Procedure Laterality Date  . APPENDECTOMY    . BACK SURGERY  1987  . CERVICAL LAMINECTOMY  2004   [name], rod was placed- HAS ROM LIMITATIONS  . CHOLECYSTECTOMY    . COLONOSCOPY    . KNEE SURGERY     left knee/arthroscopic  . TOTAL HIP ARTHROPLASTY Right 03/14/2013   Procedure: RIGHT TOTAL HIP ARTHROPLASTY ANTERIOR APPROACH;  Surgeon: [name], MD;  Location: [facility];  Service: Orthopedics;  Laterality: Right;   Family History  Problem Relation Age of Onset  .  Allergies Mother   . Stroke Mother        Clotting disorders  . Heart disease Mother   . Heart disease Father   . Heart attack Father 71  . Heart disease Brother   . Rheum arthritis Paternal Grandmother   . Other Paternal Grandmother        Rheumatism  . Coronary artery disease Other   . Diabetes Other   . Hypertension Other   . Esophageal cancer Maternal Aunt   . Colon cancer Neg Hx   . Rectal cancer Neg Hx   . Stomach cancer Neg Hx    Social History   Socioeconomic History  . Marital status: Married    Spouse name: None  . Number of children: 4  . Years of education: None  .  Highest education level: None  Social Needs  . Financial resource strain: Not hard at all  . Food insecurity - worry: Never true  . Food insecurity - inability: Never true  . Transportation needs - medical: No  . Transportation needs - non-medical: No  Occupational History  . 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 Smoking status: Former Smoker    Packs/day: 0.50    Years: 12.00    Pack years: 6.00    Types: Cigarettes    Last attempt to quit: 03/31/1971    Years since quitting: 46.0  . Smokeless tobacco: Never Used  . Tobacco comment: Started at age 41  Substance and Sexual Activity  . Alcohol use: Yes    Alcohol/week: 1.2 oz    Types: 2 Cans of beer per week    Comment: occasionally  . Drug use: No  . Sexual activity: Yes  Other Topics Concern  . None  Social History Narrative   FAMILY HISTORY   History of CAD Male 1st degree relative <50   History Diabetes 1st degree relative   History hypertension                Outpatient Encounter Medications as of 04/02/2017  Medication Sig  . acetaminophen (TYLENOL) 500 MG tablet Take 2 tablets (1,000 mg total) by mouth every 8 (eight) hours as needed for mild pain.  Marland Kitchen amLODipine (NORVASC) 10 MG tablet TAKE 1 TABLET BY MOUTH EVERY MORNING.  Marland Kitchen aspirin EC 81 MG tablet Take 81 mg by mouth daily.  . benazepril-hydrochlorthiazide (LOTENSIN HCT) 20-12.5 MG per tablet TAKE 2 TABLETS BY MOUTH DAILY.  Marland Kitchen buPROPion (WELLBUTRIN XL) 150 MG 24 hr tablet 3  qam  . Cholecalciferol (VITAMIN D3) 1000 UNIT tablet Take 1,000 Units by mouth daily.    . clotrimazole-betamethasone (LOTRISONE) cream Apply 1 application topically 2 (two) times daily.  . Cyanocobalamin (VITAMIN B-12 CR) 1000 MCG TBCR Take 1,000 mcg by mouth daily.    Marland Kitchen docusate sodium 100 MG CAPS Take 100 mg by mouth 2 (two) times daily.  . ferrous sulfate 325 (65 FE) MG tablet Take 1 tablet (325 mg total) by mouth 3 (three) times daily after  meals.  . gabapentin (NEURONTIN) 300 MG capsule TAKE 1 CAPSULE BY MOUTH 3 TIMES DAILY.  Marland Kitchen ketoconazole (NIZORAL) 2 % shampoo APPLY AS DIRECTED 2 TIMES A WEEK  . Multiple Vitamin (MULTIVITAMIN) tablet Take 1 tablet by mouth daily.  . polyethylene glycol (MIRALAX / GLYCOLAX) packet Take 17 g by mouth 2 (two) times daily.  . propranolol (INDERAL) 80 MG tablet TAKE 1 TABLET BY MOUTH 3 TIMES  DAILY.  . testosterone cypionate (DEPOTESTOSTERONE CYPIONATE) 200 MG/ML injection INJECT 3 MILLILITERS INTO THE MUSCLE EVERY 14 DAYS  . tiZANidine (ZANAFLEX) 4 MG tablet TAKE 1 TABLET BY MOUTH EVERY 8 HOURS AS NEEDED FOR MUSCLE SPASMS.  Marland Kitchen traZODone (DESYREL) 50 MG tablet Take 1-2 tablets (50-100 mg total) by mouth at bedtime.  . [DISCONTINUED] FLUoxetine (PROZAC) 40 MG capsule Take 1 capsule (40 mg total) by mouth daily.  . [DISCONTINUED] gabapentin (NEURONTIN) 300 MG capsule TAKE 1 CAPSULE BY MOUTH 3 TIMES DAILY.  . [DISCONTINUED] morphine (MS CONTIN) 30 MG 12 hr tablet Take 1 tablet (30 mg total) by mouth every 12 (twelve) hours.  . [DISCONTINUED] oxyCODONE (ROXICODONE) 15 MG immediate release tablet Take 1 tablet (15 mg total) by mouth every 6 (six) hours as needed for pain. Please fill on or after 03/16/17  . [DISCONTINUED] testosterone cypionate (DEPOTESTOSTERONE CYPIONATE) 200 MG/ML injection INJECT 3 MILLILITERS (600 MG TOTAL) INTO THE MUSCLE EVERY 14 DAYS.   No facility-administered encounter medications on file as of 04/02/2017.     Activities of Daily Living In your present state of health, do you have any difficulty performing the following activities: 04/02/2017  Hearing? N  Vision? N  Difficulty concentrating or making decisions? Y  Walking or climbing stairs? N  Dressing or bathing? N  Doing errands, shopping? N  Preparing Food and eating ? N  Using the Toilet? N  In the past six months, have you accidently leaked urine? N  Do you have problems with loss of bowel control? N  Managing your  Medications? N  Managing your Finances? N  Housekeeping or managing your Housekeeping? N  Some encounter information is confidential and restricted. Go to Review Flowsheets activity to see all data.  Some recent data might be hidden    Patient Care Team: [name], [name], MD as PCP - General (Internal Medicine) Paralee Cancel, MD as Consulting Physician (Orthopedic Surgery) Norma Fredrickson, MD as Consulting Physician (Psychiatry)   Assessment:   This is a routine wellness examination for [facility]. Physical assessment deferred to PCP.    Exercise Activities and Dietary recommendations Current Exercise Habits: The patient does not participate in regular exercise at present(States he wants to join [employer]), Exercise limited by: orthopedic condition(s)  Diet (meal preparation, eat out, water intake, caffeinated beverages, dairy products, fruits and vegetables): in general, an "unhealthy" diet, on average, 1-2 meals per day, reports poor appetite and that nothing taste good. States he does drink plenty of water.   Encouraged patient to drink nutritional supplements several times a day, eat frequently even if the amount is small, and to continue to drink at least 3-4 bottles of water per day.   Goals    . Exercise 150 minutes per week (moderate activity)     Thinking about joining the [facility] and considering pool exercise; Suggested stretching exercise     . Patient Stated     Look into joining [employer] and exercise more.       Fall Risk Fall Risk  04/02/2017 04/02/2017 02/03/2016 01/02/2015 10/15/2014  Falls in the past year? Yes Yes No No Yes  Number falls in past yr: 1 2 or more - - 1  Injury with Fall? No No - - No  Risk for fall due to : - - - Impaired mobility;Impaired balance/gait -    Depression Screen PHQ 2/9 Scores 04/02/2017 11/02/2016 02/03/2016 10/15/2014  PHQ - 2 Score 5 3 4 1   PHQ- 9 Score 16  6 - -    Cognitive Function MMSE - Mini Mental State Exam 04/02/2017    Not completed: Unable to complete  Orientation to time 5  Orientation to Place 5        Immunization History  Administered Date(s) Administered  . Influenza Split 01/28/2011, 12/18/2011  . Influenza Whole 02/14/2007, 12/11/2009  . Influenza, High Dose Seasonal PF 02/03/2016, 12/30/2016  . Influenza,inj,Quad PF,6+ Mos 01/03/2013, 01/10/2014, 12/05/2014  . Pneumococcal Conjugate-13 05/08/2013  . Pneumococcal Polysaccharide-23 08/27/2009  . Td 03/30/2008  . Zoster 04/13/2014   Screening Tests Health Maintenance  Topic Date Due  . OPHTHALMOLOGY EXAM  11/29/1950  . FOOT EXAM  08/24/2013  . HEMOGLOBIN A1C  07/12/2014  . TETANUS/TDAP  03/30/2018  . INFLUENZA VACCINE  Completed  . PNA vac Low Risk Adult  Completed      Plan:    Continue doing brain stimulating activities (puzzles, reading, adult coloring books, staying active) to keep memory sharp.   Continue to eat heart healthy diet (full of fruits, vegetables, whole grains, lean protein, water--limit salt, fat, and sugar intake) and increase physical activity as tolerated.  I have personally reviewed and noted the following in the patient's chart:   . Medical and social history . Use of alcohol, tobacco or illicit drugs  . Current medications and supplements . Functional ability and status . Nutritional status . Physical activity . Advanced directives . List of other physicians . Vitals . Screenings to include cognitive, depression, and falls . Referrals and appointments  In addition, I have reviewed and discussed with patient certain preventive protocols, quality metrics, and best practice recommendations. A written personalized care plan for preventive services as well as general preventive health recommendations were provided to patient.     Michiel Cowboy, RN  04/02/2017  Medical screening examination/treatment/procedure(s) were performed by non-physician practitioner and as supervising physician I was immediately  available for consultation/collaboration. I agree with above. Lew Dawes, MD

## 2017-04-02 ENCOUNTER — Encounter: Payer: Self-pay | Admitting: Internal Medicine

## 2017-04-02 ENCOUNTER — Ambulatory Visit (INDEPENDENT_AMBULATORY_CARE_PROVIDER_SITE_OTHER): Payer: PPO | Admitting: Internal Medicine

## 2017-04-02 ENCOUNTER — Ambulatory Visit (INDEPENDENT_AMBULATORY_CARE_PROVIDER_SITE_OTHER): Payer: PPO | Admitting: *Deleted

## 2017-04-02 DIAGNOSIS — E538 Deficiency of other specified B group vitamins: Secondary | ICD-10-CM | POA: Diagnosis not present

## 2017-04-02 DIAGNOSIS — M5440 Lumbago with sciatica, unspecified side: Secondary | ICD-10-CM

## 2017-04-02 DIAGNOSIS — E119 Type 2 diabetes mellitus without complications: Secondary | ICD-10-CM | POA: Diagnosis not present

## 2017-04-02 DIAGNOSIS — F339 Major depressive disorder, recurrent, unspecified: Secondary | ICD-10-CM | POA: Diagnosis not present

## 2017-04-02 DIAGNOSIS — Z0000 Encounter for general adult medical examination without abnormal findings: Secondary | ICD-10-CM

## 2017-04-02 DIAGNOSIS — F411 Generalized anxiety disorder: Secondary | ICD-10-CM | POA: Diagnosis not present

## 2017-04-02 MED ORDER — OXYCODONE HCL 15 MG PO TABS: 15.0000 mg | ORAL_TABLET | Freq: Four times a day (QID) | ORAL | 0 refills | Status: DC | PRN

## 2017-04-02 MED ORDER — MORPHINE SULFATE ER 30 MG PO TBCR: 30.0000 mg | EXTENDED_RELEASE_TABLET | Freq: Two times a day (BID) | ORAL | 0 refills | Status: DC

## 2017-04-02 MED ORDER — FLUOXETINE HCL 40 MG PO CAPS: 40.0000 mg | ORAL_CAPSULE | Freq: Every day | ORAL | 3 refills | Status: DC

## 2017-04-02 NOTE — Assessment & Plan Note (Signed)
Oxycodone, MS-contin, Tizanidine Chronic Potential benefits of a long term opioids use as well as potential risks (i.e. addiction risk, apnea etc) and complications (i.e. Somnolence, constipation and others) were explained to the patient and were aknowledged.   

## 2017-04-02 NOTE — Assessment & Plan Note (Signed)
Fluoxetine was renewed

## 2017-04-02 NOTE — Assessment & Plan Note (Signed)
On B12 

## 2017-04-02 NOTE — Patient Instructions (Addendum)
Continue doing brain stimulating activities (puzzles, reading, adult coloring books, staying active) to keep memory sharp.   Continue to eat heart healthy diet (full of fruits, vegetables, whole grains, lean protein, water--limit salt, fat, and sugar intake) and increase physical activity as tolerated.   Mr. Eddins , Thank you for taking time to come for your Medicare Wellness Visit. I appreciate your ongoing commitment to your health goals. Please review the following plan we discussed and let me know if I can assist you in the future.   These are the goals we discussed: Goals    . Exercise 150 minutes per week (moderate activity)     Thinking about joining the [facility] and considering pool exercise; Suggested stretching exercise     . Patient Stated     Look into joining [employer] and exercise more.       This is a list of the screening recommended for you and due dates:  Health Maintenance  Topic Date Due  . Eye exam for diabetics  11/29/1950  . Complete foot exam   08/24/2013  . Hemoglobin A1C  07/12/2014  . Tetanus Vaccine  03/30/2018  . Flu Shot  Completed  . Pneumonia vaccines  Completed

## 2017-04-02 NOTE — Progress Notes (Signed)
Subjective:  Patient ID: Vernon Bishop, male    DOB: 1940-09-22  Age: 77 y.o. MRN: [id number]  CC: No chief complaint on file.   HPI Vernon Bishop presents for LBP, HTN, anxiety f/u  Outpatient Medications Prior to Visit  Medication Sig Dispense Refill  . acetaminophen (TYLENOL) 500 MG tablet Take 2 tablets (1,000 mg total) by mouth every 8 (eight) hours as needed for mild pain. 30 tablet 0  . amLODipine (NORVASC) 10 MG tablet TAKE 1 TABLET BY MOUTH EVERY MORNING. 90 tablet 3  . aspirin EC 81 MG tablet Take 81 mg by mouth daily.    . benazepril-hydrochlorthiazide (LOTENSIN HCT) 20-12.5 MG per tablet TAKE 2 TABLETS BY MOUTH DAILY. 60 tablet 5  . buPROPion (WELLBUTRIN XL) 150 MG 24 hr tablet 3  qam 90 tablet 5  . Cholecalciferol (VITAMIN D3) 1000 UNIT tablet Take 1,000 Units by mouth daily.      . clotrimazole-betamethasone (LOTRISONE) cream Apply 1 application topically 2 (two) times daily. 60 g 1  . Cyanocobalamin (VITAMIN B-12 CR) 1000 MCG TBCR Take 1,000 mcg by mouth daily.      Marland Kitchen docusate sodium 100 MG CAPS Take 100 mg by mouth 2 (two) times daily. 10 capsule 0  . ferrous sulfate 325 (65 FE) MG tablet Take 1 tablet (325 mg total) by mouth 3 (three) times daily after meals.  3  . FLUoxetine (PROZAC) 40 MG capsule Take 1 capsule (40 mg total) by mouth daily. 90 capsule 2  . gabapentin (NEURONTIN) 300 MG capsule TAKE 1 CAPSULE BY MOUTH 3 TIMES DAILY. 90 capsule 5  . ketoconazole (NIZORAL) 2 % shampoo APPLY AS DIRECTED 2 TIMES A WEEK 120 mL 0  . morphine (MS CONTIN) 30 MG 12 hr tablet Take 1 tablet (30 mg total) by mouth every 12 (twelve) hours. 60 tablet 0  . Multiple Vitamin (MULTIVITAMIN) tablet Take 1 tablet by mouth daily.    Marland Kitchen oxyCODONE (ROXICODONE) 15 MG immediate release tablet Take 1 tablet (15 mg total) by mouth every 6 (six) hours as needed for pain. Please fill on or after 03/16/17 120 tablet 0  . polyethylene glycol (MIRALAX / GLYCOLAX) packet Take 17 g by mouth 2 (two)  times daily. 14 each 0  . propranolol (INDERAL) 80 MG tablet TAKE 1 TABLET BY MOUTH 3 TIMES DAILY. 270 tablet 1  . testosterone cypionate (DEPOTESTOSTERONE CYPIONATE) 200 MG/ML injection INJECT 3 MILLILITERS INTO THE MUSCLE EVERY 14 DAYS 10 mL 5  . tiZANidine (ZANAFLEX) 4 MG tablet TAKE 1 TABLET BY MOUTH EVERY 8 HOURS AS NEEDED FOR MUSCLE SPASMS. 60 tablet 1  . traZODone (DESYREL) 50 MG tablet Take 1-2 tablets (50-100 mg total) by mouth at bedtime. 60 tablet 5  . gabapentin (NEURONTIN) 300 MG capsule TAKE 1 CAPSULE BY MOUTH 3 TIMES DAILY. 90 capsule 0   No facility-administered medications prior to visit.     ROS Review of Systems  Objective:  BP 140/72 (BP Location: Left Arm, Patient Position: Sitting, Cuff Size: Large)   Pulse (!) 52   Temp 98.2 F (36.8 C) (Oral)   Ht 5\' 6"  (1.676 m)   Wt 231 lb (104.8 kg)   SpO2 98%   BMI 37.28 kg/m   BP Readings from Last 3 Encounters:  04/02/17 140/72  12/30/16 (!) 142/70  11/03/16 138/70    Wt Readings from Last 3 Encounters:  04/02/17 231 lb (104.8 kg)  12/30/16 234 lb (106.1 kg)  11/03/16 228 lb (103.4 kg)  Physical Exam  Lab Results  Component Value Date   WBC 4.8 12/30/2016   HGB 14.9 12/30/2016   HCT 43.5 12/30/2016   PLT 188.0 12/30/2016   GLUCOSE 99 12/30/2016   CHOL 136 02/03/2016   TRIG 133.0 02/03/2016   HDL 31.60 (L) 02/03/2016   LDLCALC 78 02/03/2016   ALT 12 12/30/2016   AST 13 12/30/2016   NA 139 12/30/2016   K 4.1 12/30/2016   CL 102 12/30/2016   CREATININE 1.07 12/30/2016   BUN 16 12/30/2016   CO2 30 12/30/2016   TSH 2.64 12/30/2016   PSA 0.45 12/30/2016   INR 1.00 03/07/2013   HGBA1C 5.1 01/10/2014    Dg Hip Unilat With Pelvis 2-3 Views Left  Result Date: 05/06/2016 CLINICAL DATA:  Fall 1 month ago with persistent left hip pain EXAM: DG HIP (WITH OR WITHOUT PELVIS) 2-3V LEFT COMPARISON:  AP pelvis x-ray dated 14 March 2013 FINDINGS: The bony pelvis is subjectively adequately mineralized. No  acute fracture is observed. The observed portions of the sacrum are grossly normal. 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 On the left the hip joint spaces reasonably well-maintained. There is some scleroses of the inferior aspect of the acetabulum. The femoral head and acetabular articular surfaces remain smooth. The femoral neck, intertrochanteric, and subtrochanteric regions are normal. IMPRESSION: There is mild degenerative change of the left hip without significant joint space loss. There is no acute or healing fracture or dislocation. Electronically Signed   By: David  Martinique M.D.   On: 05/06/2016 15:05    Assessment & Plan:   There are no diagnoses linked to this encounter. I am having Vernon Bishop maintain his Vitamin B-12 CR, cholecalciferol, DSS, ferrous sulfate, polyethylene glycol, acetaminophen, benazepril-hydrochlorthiazide, aspirin EC, multivitamin, propranolol, tiZANidine, amLODipine, traZODone, clotrimazole-betamethasone, buPROPion, FLUoxetine, oxyCODONE, morphine, ketoconazole, gabapentin, and testosterone cypionate.  No orders of the defined types were placed in this encounter.    Follow-up: No Follow-up on file.  Walker Kehr, MD

## 2017-04-08 ENCOUNTER — Other Ambulatory Visit: Payer: Self-pay | Admitting: Internal Medicine

## 2017-04-23 ENCOUNTER — Ambulatory Visit (INDEPENDENT_AMBULATORY_CARE_PROVIDER_SITE_OTHER): Payer: PPO | Admitting: Internal Medicine

## 2017-04-23 ENCOUNTER — Encounter: Payer: Self-pay | Admitting: Internal Medicine

## 2017-04-23 DIAGNOSIS — M5440 Lumbago with sciatica, unspecified side: Secondary | ICD-10-CM | POA: Diagnosis not present

## 2017-04-23 DIAGNOSIS — N401 Enlarged prostate with lower urinary tract symptoms: Secondary | ICD-10-CM | POA: Diagnosis not present

## 2017-04-23 DIAGNOSIS — N138 Other obstructive and reflux uropathy: Secondary | ICD-10-CM | POA: Diagnosis not present

## 2017-04-23 DIAGNOSIS — G4700 Insomnia, unspecified: Secondary | ICD-10-CM

## 2017-04-23 DIAGNOSIS — N400 Enlarged prostate without lower urinary tract symptoms: Secondary | ICD-10-CM | POA: Insufficient documentation

## 2017-04-23 MED ORDER — TRAZODONE HCL 50 MG PO TABS: 50.0000 mg | ORAL_TABLET | Freq: Every day | ORAL | 5 refills | Status: DC

## 2017-04-23 MED ORDER — DUTASTERIDE 0.5 MG PO CAPS
0.5000 mg | ORAL_CAPSULE | Freq: Every day | ORAL | 11 refills | Status: DC
Start: 2017-04-23 — End: 2018-06-12

## 2017-04-23 NOTE — Assessment & Plan Note (Signed)
Pain meds effects on prostate were discussed

## 2017-04-23 NOTE — Assessment & Plan Note (Signed)
Trazodone  °

## 2017-04-23 NOTE — Assessment & Plan Note (Signed)
worse Start Avodart PSA 0.45 in 10/18

## 2017-04-23 NOTE — Progress Notes (Signed)
Subjective:  Patient ID: Vernon Bishop, male    DOB: [birth year]  Age: 77 y.o. MRN: [id number]  CC: No chief complaint on file.   HPI ISAIH BULGER presents for a weak stream, stop and go stream, dribbling x weeks; worse. Saw palmetto is not helping. He ran out of it F/u insomnia  Outpatient Medications Prior to Visit  Medication Sig Dispense Refill  . acetaminophen (TYLENOL) 500 MG tablet Take 2 tablets (1,000 mg total) by mouth every 8 (eight) hours as needed for mild pain. 30 tablet 0  . amLODipine (NORVASC) 10 MG tablet TAKE 1 TABLET BY MOUTH EVERY MORNING. 90 tablet 3  . aspirin EC 81 MG tablet Take 81 mg by mouth daily.    . benazepril-hydrochlorthiazide (LOTENSIN HCT) 20-12.5 MG per tablet TAKE 2 TABLETS BY MOUTH DAILY. 60 tablet 5  . buPROPion (WELLBUTRIN XL) 150 MG 24 hr tablet 3  qam 90 tablet 5  . Cholecalciferol (VITAMIN D3) 1000 UNIT tablet Take 1,000 Units by mouth daily.      . clotrimazole-betamethasone (LOTRISONE) cream Apply 1 application topically 2 (two) times daily. 60 g 1  . Cyanocobalamin (VITAMIN B-12 CR) 1000 MCG TBCR Take 1,000 mcg by mouth daily.      Marland Kitchen docusate sodium 100 MG CAPS Take 100 mg by mouth 2 (two) times daily. 10 capsule 0  . ferrous sulfate 325 (65 FE) MG tablet Take 1 tablet (325 mg total) by mouth 3 (three) times daily after meals.  3  . FLUoxetine (PROZAC) 40 MG capsule Take 1 capsule (40 mg total) by mouth daily. 90 capsule 3  . gabapentin (NEURONTIN) 300 MG capsule TAKE 1 CAPSULE BY MOUTH 3 TIMES DAILY. 90 capsule 5  . ketoconazole (NIZORAL) 2 % shampoo APPLY AS DIRECTED 2 TIMES A WEEK 120 mL 0  . morphine (MS CONTIN) 30 MG 12 hr tablet Take 1 tablet (30 mg total) by mouth every 12 (twelve) hours. 60 tablet 0  . Multiple Vitamin (MULTIVITAMIN) tablet Take 1 tablet by mouth daily.    Marland Kitchen oxyCODONE (ROXICODONE) 15 MG immediate release tablet Take 1 tablet (15 mg total) by mouth every 6 (six) hours as needed for pain. Please fill on or after  06/15/17 120 tablet 0  . polyethylene glycol (MIRALAX / GLYCOLAX) packet Take 17 g by mouth 2 (two) times daily. 14 each 0  . propranolol (INDERAL) 80 MG tablet TAKE 1 TABLET BY MOUTH 3 TIMES DAILY. 270 tablet 1  . testosterone cypionate (DEPOTESTOSTERONE CYPIONATE) 200 MG/ML injection INJECT 3 MILLILITERS INTO THE MUSCLE EVERY 14 DAYS 10 mL 5  . tiZANidine (ZANAFLEX) 4 MG tablet TAKE 1 TABLET BY MOUTH EVERY 8 HOURS AS NEEDED FOR MUSCLE SPASMS. 60 tablet 1  . traZODone (DESYREL) 50 MG tablet Take 1-2 tablets (50-100 mg total) by mouth at bedtime. 60 tablet 5  . propranolol (INDERAL) 80 MG tablet TAKE 1 TABLET BY MOUTH 3 TIMES DAILY. 270 tablet 1   No facility-administered medications prior to visit.     ROS Review of Systems  Constitutional: Positive for fatigue. Negative for appetite change and unexpected weight change.  HENT: Negative for congestion, nosebleeds, sneezing, sore throat and trouble swallowing.   Eyes: Negative for itching and visual disturbance.  Respiratory: Negative for cough.   Cardiovascular: Negative for chest pain, palpitations and leg swelling.  Gastrointestinal: Negative for abdominal distention, blood in stool, diarrhea and nausea.  Genitourinary: Positive for difficulty urinating and urgency. Negative for enuresis, frequency and hematuria.  Musculoskeletal: Positive for arthralgias and back pain. Negative for gait problem, joint swelling and neck pain.  Skin: Negative for rash.  Neurological: Negative for dizziness, tremors, speech difficulty and weakness.  Psychiatric/Behavioral: Negative for agitation, dysphoric mood and sleep disturbance. The patient is not nervous/anxious.     Objective:  BP 126/78 (BP Location: Left Arm, Patient Position: Sitting, Cuff Size: Large)   Pulse (!) 54   Temp 98.3 F (36.8 C) (Oral)   Ht 5\' 6"  (1.676 m)   Wt 227 lb (103 kg)   SpO2 98%   BMI 36.64 kg/m   BP Readings from Last 3 Encounters:  04/23/17 126/78  04/02/17  140/72  12/30/16 (!) 142/70    Wt Readings from Last 3 Encounters:  04/23/17 227 lb (103 kg)  04/02/17 231 lb (104.8 kg)  12/30/16 234 lb (106.1 kg)    Physical Exam  Constitutional: He is oriented to person, place, and time. He appears well-developed. No distress.  NAD  HENT:  Mouth/Throat: Oropharynx is clear and moist.  Eyes: Conjunctivae are normal. Pupils are equal, round, and reactive to light.  Neck: Normal range of motion. No JVD present. No thyromegaly present.  Cardiovascular: Normal rate, regular rhythm, normal heart sounds and intact distal pulses. Exam reveals no gallop and no friction rub.  No murmur heard. Pulmonary/Chest: Effort normal and breath sounds normal. No respiratory distress. He has no wheezes. He has no rales. He exhibits no tenderness.  Abdominal: Soft. Bowel sounds are normal. He exhibits no distension and no mass. There is no tenderness. There is no rebound and no guarding.  Genitourinary: Rectum normal. Rectal exam shows guaiac negative stool.  Musculoskeletal: Normal range of motion. He exhibits no edema or tenderness.  Lymphadenopathy:    He has no cervical adenopathy.  Neurological: He is alert and oriented to person, place, and time. He has normal reflexes. No cranial nerve deficit. He exhibits normal muscle tone. He displays a negative Romberg sign. Coordination and gait normal.  Skin: Skin is warm and dry. No rash noted.  Psychiatric: He has a normal mood and affect. His behavior is normal. Judgment and thought content normal.  Prostate 1+ NT  Lab Results  Component Value Date   WBC 4.8 12/30/2016   HGB 14.9 12/30/2016   HCT 43.5 12/30/2016   PLT 188.0 12/30/2016   GLUCOSE 99 12/30/2016   CHOL 136 02/03/2016   TRIG 133.0 02/03/2016   HDL 31.60 (L) 02/03/2016   LDLCALC 78 02/03/2016   ALT 12 12/30/2016   AST 13 12/30/2016   NA 139 12/30/2016   K 4.1 12/30/2016   CL 102 12/30/2016   CREATININE 1.07 12/30/2016   BUN 16 12/30/2016   CO2  30 12/30/2016   TSH 2.64 12/30/2016   PSA 0.45 12/30/2016   INR 1.00 03/07/2013   HGBA1C 5.1 01/10/2014    Dg Hip Unilat With Pelvis 2-3 Views Left  Result Date: 05/06/2016 CLINICAL DATA:  Fall 1 month ago with persistent left hip pain EXAM: DG HIP (WITH OR WITHOUT PELVIS) 2-3V LEFT COMPARISON:  AP pelvis x-ray dated 14 March 2013 FINDINGS: The bony pelvis is subjectively adequately mineralized. No acute fracture is observed. The observed portions of the sacrum are grossly normal. 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 On the left the hip joint spaces reasonably well-maintained. There is some scleroses of the inferior aspect of the acetabulum. The femoral head and acetabular articular surfaces remain smooth. The femoral neck, intertrochanteric, and subtrochanteric regions are normal. IMPRESSION:  There is mild degenerative change of the left hip without significant joint space loss. There is no acute or healing fracture or dislocation. Electronically Signed   By: David  Martinique M.D.   On: 05/06/2016 15:05    Assessment & Plan:   There are no diagnoses linked to this encounter. I am having Vernon Bishop maintain his Vitamin B-12 CR, cholecalciferol, DSS, ferrous sulfate, polyethylene glycol, acetaminophen, benazepril-hydrochlorthiazide, aspirin EC, multivitamin, propranolol, tiZANidine, amLODipine, traZODone, clotrimazole-betamethasone, buPROPion, ketoconazole, gabapentin, testosterone cypionate, oxyCODONE, morphine, and FLUoxetine.  No orders of the defined types were placed in this encounter.    Follow-up: No Follow-up on file.  Walker Kehr, MD

## 2017-06-01 ENCOUNTER — Other Ambulatory Visit: Payer: Self-pay | Admitting: Internal Medicine

## 2017-06-09 ENCOUNTER — Other Ambulatory Visit: Payer: Self-pay | Admitting: Internal Medicine

## 2017-06-19 ENCOUNTER — Other Ambulatory Visit: Payer: Self-pay | Admitting: Internal Medicine

## 2017-07-02 ENCOUNTER — Ambulatory Visit: Payer: Self-pay | Admitting: Internal Medicine

## 2017-07-05 ENCOUNTER — Ambulatory Visit (INDEPENDENT_AMBULATORY_CARE_PROVIDER_SITE_OTHER): Payer: PPO | Admitting: Internal Medicine

## 2017-07-05 ENCOUNTER — Encounter: Payer: Self-pay | Admitting: Internal Medicine

## 2017-07-05 DIAGNOSIS — I10 Essential (primary) hypertension: Secondary | ICD-10-CM

## 2017-07-05 DIAGNOSIS — E119 Type 2 diabetes mellitus without complications: Secondary | ICD-10-CM | POA: Diagnosis not present

## 2017-07-05 DIAGNOSIS — E538 Deficiency of other specified B group vitamins: Secondary | ICD-10-CM | POA: Diagnosis not present

## 2017-07-05 DIAGNOSIS — F339 Major depressive disorder, recurrent, unspecified: Secondary | ICD-10-CM

## 2017-07-05 DIAGNOSIS — M5440 Lumbago with sciatica, unspecified side: Secondary | ICD-10-CM | POA: Diagnosis not present

## 2017-07-05 MED ORDER — OXYCODONE HCL 15 MG PO TABS: 15.0000 mg | ORAL_TABLET | Freq: Four times a day (QID) | ORAL | 0 refills | Status: DC | PRN

## 2017-07-05 NOTE — Assessment & Plan Note (Signed)
Oxycodone, MS-contin, Tizanidine Chronic Potential benefits of a long term opioids use as well as potential risks (i.e. addiction risk, apnea etc) and complications (i.e. Somnolence, constipation and others) were explained to the patient and were aknowledged.   

## 2017-07-05 NOTE — Assessment & Plan Note (Signed)
On B12 

## 2017-07-05 NOTE — Assessment & Plan Note (Signed)
Lotensin HCT, Amlodipine, Inderal 

## 2017-07-05 NOTE — Assessment & Plan Note (Signed)
On Fluoxetine, Wellbutrin XL 450 mg/d

## 2017-07-05 NOTE — Progress Notes (Signed)
Subjective:  Patient ID: Vernon Bishop, male    DOB: 18-Jan-1941  Age: 77 y.o. MRN: [id number]  CC: No chief complaint on file.   HPI Vernon Bishop presents for LBP, HTN, depression f/u  Outpatient Medications Prior to Visit  Medication Sig Dispense Refill  . acetaminophen (TYLENOL) 500 MG tablet Take 2 tablets (1,000 mg total) by mouth every 8 (eight) hours as needed for mild pain. 30 tablet 0  . amLODipine (NORVASC) 10 MG tablet TAKE 1 TABLET BY MOUTH EVERY MORNING. 90 tablet 3  . aspirin EC 81 MG tablet Take 81 mg by mouth daily.    . benazepril-hydrochlorthiazide (LOTENSIN HCT) 20-12.5 MG per tablet TAKE 2 TABLETS BY MOUTH DAILY. 60 tablet 5  . buPROPion (WELLBUTRIN XL) 150 MG 24 hr tablet 3  qam 90 tablet 5  . Cholecalciferol (VITAMIN D3) 1000 UNIT tablet Take 1,000 Units by mouth daily.      . clotrimazole-betamethasone (LOTRISONE) cream Apply 1 application topically 2 (two) times daily. 60 g 1  . Cyanocobalamin (VITAMIN B-12 CR) 1000 MCG TBCR Take 1,000 mcg by mouth daily.      Marland Kitchen docusate sodium 100 MG CAPS Take 100 mg by mouth 2 (two) times daily. 10 capsule 0  . dutasteride (AVODART) 0.5 MG capsule Take 1 capsule (0.5 mg total) by mouth daily. 30 capsule 11  . ferrous sulfate 325 (65 FE) MG tablet Take 1 tablet (325 mg total) by mouth 3 (three) times daily after meals.  3  . FLUoxetine (PROZAC) 40 MG capsule Take 1 capsule (40 mg total) by mouth daily. 90 capsule 3  . gabapentin (NEURONTIN) 300 MG capsule TAKE 1 CAPSULE BY MOUTH THREE TIMES A DAY 90 capsule 3  . ketoconazole (NIZORAL) 2 % shampoo APPLY AS DIRECTED 2 TIMES A WEEK 120 mL 0  . morphine (MS CONTIN) 30 MG 12 hr tablet Take 1 tablet (30 mg total) by mouth every 12 (twelve) hours. 60 tablet 0  . Multiple Vitamin (MULTIVITAMIN) tablet Take 1 tablet by mouth daily.    Marland Kitchen oxyCODONE (ROXICODONE) 15 MG immediate release tablet Take 1 tablet (15 mg total) by mouth every 6 (six) hours as needed for pain. Please fill on or  after 06/15/17 120 tablet 0  . polyethylene glycol (MIRALAX / GLYCOLAX) packet Take 17 g by mouth 2 (two) times daily. 14 each 0  . propranolol (INDERAL) 80 MG tablet TAKE 1 TABLET BY MOUTH 3 TIMES DAILY. 270 tablet 1  . testosterone cypionate (DEPOTESTOSTERONE CYPIONATE) 200 MG/ML injection INJECT 3 MILLILITERS INTO THE MUSCLE EVERY 14 DAYS 10 mL 5  . tiZANidine (ZANAFLEX) 4 MG tablet TAKE 1 TABLET BY MOUTH EVERY 8 HOURS AS NEEDED FOR MUSCLE SPASMS. 60 tablet 2  . traZODone (DESYREL) 50 MG tablet Take 1-2 tablets (50-100 mg total) by mouth at bedtime. 60 tablet 5   No facility-administered medications prior to visit.     ROS Review of Systems  Constitutional: Positive for fatigue. Negative for appetite change and unexpected weight change.  HENT: Negative for congestion, nosebleeds, sneezing, sore throat and trouble swallowing.   Eyes: Negative for itching and visual disturbance.  Respiratory: Negative for cough.   Cardiovascular: Negative for chest pain, palpitations and leg swelling.  Gastrointestinal: Negative for abdominal distention, blood in stool, diarrhea and nausea.  Genitourinary: Negative for frequency and hematuria.  Musculoskeletal: Positive for arthralgias and back pain. Negative for gait problem, joint swelling and neck pain.  Skin: Negative for rash.  Neurological: Positive  for dizziness and weakness. Negative for tremors and speech difficulty.  Psychiatric/Behavioral: Negative for agitation, dysphoric mood and sleep disturbance. The patient is not nervous/anxious.     Objective:  BP 134/82 (BP Location: Left Arm, Patient Position: Sitting, Cuff Size: Large)   Pulse (!) 57   Temp 99.1 F (37.3 C) (Oral)   Ht 5\' 6"  (1.676 m)   Wt 226 lb (102.5 kg)   SpO2 96%   BMI 36.48 kg/m   BP Readings from Last 3 Encounters:  07/05/17 134/82  04/23/17 126/78  04/02/17 140/72    Wt Readings from Last 3 Encounters:  07/05/17 226 lb (102.5 kg)  04/23/17 227 lb (103 kg)    04/02/17 231 lb (104.8 kg)    Physical Exam  Constitutional: He is oriented to person, place, and time. He appears well-developed. No distress.  NAD  HENT:  Mouth/Throat: Oropharynx is clear and moist.  Eyes: Pupils are equal, round, and reactive to light. Conjunctivae are normal.  Neck: Normal range of motion. No JVD present. No thyromegaly present.  Cardiovascular: Normal rate, regular rhythm, normal heart sounds and intact distal pulses. Exam reveals no gallop and no friction rub.  No murmur heard. Pulmonary/Chest: Effort normal and breath sounds normal. No respiratory distress. He has no wheezes. He has no rales. He exhibits no tenderness.  Abdominal: Soft. Bowel sounds are normal. He exhibits no distension and no mass. There is no tenderness. There is no rebound and no guarding.  Musculoskeletal: Normal range of motion. He exhibits no edema or tenderness.  Lymphadenopathy:    He has no cervical adenopathy.  Neurological: He is alert and oriented to person, place, and time. He has normal reflexes. No cranial nerve deficit. He exhibits normal muscle tone. He displays a negative Romberg sign. Coordination and gait normal.  Skin: Skin is warm and dry. No rash noted.  Psychiatric: His behavior is normal. Judgment and thought content normal.   LS tender w/ROM \ Lab Results  Component Value Date   WBC 4.8 12/30/2016   HGB 14.9 12/30/2016   HCT 43.5 12/30/2016   PLT 188.0 12/30/2016   GLUCOSE 99 12/30/2016   CHOL 136 02/03/2016   TRIG 133.0 02/03/2016   HDL 31.60 (L) 02/03/2016   LDLCALC 78 02/03/2016   ALT 12 12/30/2016   AST 13 12/30/2016   NA 139 12/30/2016   K 4.1 12/30/2016   CL 102 12/30/2016   CREATININE 1.07 12/30/2016   BUN 16 12/30/2016   CO2 30 12/30/2016   TSH 2.64 12/30/2016   PSA 0.45 12/30/2016   INR 1.00 03/07/2013   HGBA1C 5.1 01/10/2014    Dg Hip Unilat With Pelvis 2-3 Views Left  Result Date: 05/06/2016 CLINICAL DATA:  Fall 1 month ago with  persistent left hip pain EXAM: DG HIP (WITH OR WITHOUT PELVIS) 2-3V LEFT COMPARISON:  AP pelvis x-ray dated 14 March 2013 FINDINGS: The bony pelvis is subjectively adequately mineralized. No acute fracture is observed. The observed portions of the sacrum are grossly normal. 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 On the left the hip joint spaces reasonably well-maintained. There is some scleroses of the inferior aspect of the acetabulum. The femoral head and acetabular articular surfaces remain smooth. The femoral neck, intertrochanteric, and subtrochanteric regions are normal. IMPRESSION: There is mild degenerative change of the left hip without significant joint space loss. There is no acute or healing fracture or dislocation. Electronically Signed   By: David  Martinique M.D.   On: 05/06/2016 15:05  Assessment & Plan:   There are no diagnoses linked to this encounter. I am having Vernon Bishop maintain his Vitamin B-12 CR, cholecalciferol, DSS, ferrous sulfate, polyethylene glycol, acetaminophen, benazepril-hydrochlorthiazide, aspirin EC, multivitamin, propranolol, clotrimazole-betamethasone, buPROPion, ketoconazole, testosterone cypionate, oxyCODONE, morphine, FLUoxetine, dutasteride, traZODone, amLODipine, tiZANidine, and gabapentin.  No orders of the defined types were placed in this encounter.    Follow-up: No follow-ups on file.  Walker Kehr, MD

## 2017-07-05 NOTE — Assessment & Plan Note (Signed)
Chronic, diet controlled. 

## 2017-07-06 ENCOUNTER — Telehealth: Payer: Self-pay | Admitting: Internal Medicine

## 2017-07-06 NOTE — Telephone Encounter (Signed)
Copied from [place]. Topic: Quick Communication - See Telephone Encounter >> Jul 06, 2017  4:50 PM Vernona Rieger wrote: CRM for notification. See Telephone encounter for: 07/06/17. Patient states that he was in the office yesterday and that Dr Alain Marion was going to send over a refill for morphine (MS CONTIN) 30 MG 12 hr tablet. He said that pharmacy does not have it.    [place], [place]

## 2017-07-08 NOTE — Telephone Encounter (Signed)
Please advise 

## 2017-07-16 ENCOUNTER — Telehealth: Payer: Self-pay | Admitting: Internal Medicine

## 2017-07-16 NOTE — Telephone Encounter (Signed)
MS Contin LOV: 07/05/17 Last Refill: 04/02/17 PCP: Dr [facility] Pharmacy: [facility] [place], [place]

## 2017-07-16 NOTE — Telephone Encounter (Signed)
Copied from [place] [phone]. Topic: Quick Communication - See Telephone Encounter >> Jul 16, 2017  1:11 PM Conception Chancy, NT wrote: CRM for notification. See Telephone encounter for: 07/16/17.  Patient is requesting a refill on morphine (MS CONTIN) 30 MG 12 hr tablet. Please advise.  [place], [place] [address] Phone: [phone] Fax: [phone]

## 2017-07-19 NOTE — Telephone Encounter (Signed)
Per database last filled 06/15/17

## 2017-07-20 ENCOUNTER — Telehealth: Payer: Self-pay | Admitting: Internal Medicine

## 2017-07-20 MED ORDER — MORPHINE SULFATE ER 30 MG PO TBCR: 30.0000 mg | EXTENDED_RELEASE_TABLET | Freq: Two times a day (BID) | ORAL | 0 refills | Status: DC

## 2017-07-20 NOTE — Addendum Note (Signed)
Addended by: Cassandria Anger on: 07/20/2017 01:14 PM   Modules accepted: Orders

## 2017-07-20 NOTE — Telephone Encounter (Unsigned)
Copied from [place] [id number]. Topic: Quick Communication - See Telephone Encounter >> Jul 20, 2017  3:05 PM Percell Belt A wrote: CRM for notification. See Telephone encounter for: 07/20/17. Pt called in and stated that he rec'd his oxycodone script printed the day of his appt but he Is is saying that he never rec'd his  morphine (MS CONTIN) 30 MG 12 hr tablet [[id number]]  script on that day.  He said that he has looked for it and he doesn't have it?  Pt is going to bring the paper scripts back to the office so they can look at them.

## 2017-07-20 NOTE — Telephone Encounter (Signed)
Ok Thx 

## 2017-07-20 NOTE — Telephone Encounter (Signed)
Pt picked up RX

## 2017-08-19 ENCOUNTER — Telehealth: Payer: Self-pay | Admitting: Internal Medicine

## 2017-08-19 NOTE — Telephone Encounter (Signed)
Copied from [place] [id number]. Topic: Quick Communication - Rx Refill/Question >> Aug 19, 2017 10:39 AM Lennox Solders wrote: Medication: morphine 30 mg Has the patient contacted their pharmacy? {no (Agent: If no, request that the patient contact the pharmacy for the refill.) (Agent: If yes, when and what did the pharmacy advise?)  Preferred Pharmacy (with phone number or street name):[facility] Agent: Please be advised that RX refills may take up to 3 business days. We ask that you follow-up with your pharmacy.

## 2017-08-19 NOTE — Telephone Encounter (Signed)
Check [name] registry last filled 07/20/2017.Marland KitchenJohny Bishop

## 2017-08-21 MED ORDER — MORPHINE SULFATE ER 30 MG PO TBCR: 30.0000 mg | EXTENDED_RELEASE_TABLET | Freq: Two times a day (BID) | ORAL | 0 refills | Status: DC

## 2017-08-21 NOTE — Telephone Encounter (Signed)
Okay.  We will do.  Thank you

## 2017-08-30 NOTE — Progress Notes (Signed)
Vernon Bishop is a 77 y.o. male patient who is discharged from counseling by pt choice.  Outpatient Therapist Discharge Summary  YESENIA FONTENETTE    [birth year]   Admission Date: 08/04/16   Discharge Date:  02/08/17 Reason for Discharge:  Pt choice Diagnosis:  MDD Comments:  Pt reported at 12/09/16 visit that he was meeting his goals.   Jenne Campus, LPC

## 2017-09-16 ENCOUNTER — Other Ambulatory Visit: Payer: Self-pay | Admitting: Internal Medicine

## 2017-09-16 NOTE — Telephone Encounter (Signed)
Relation to pt: self  Call back number:[phone]  Pharmacy:  [place], [place] [phone] (Phone)  [phone] (Fax)      Reason for call:   Patient requesting morphine (MS CONTIN) 30 MG 12 hr tablet, informed patient please allow 48 to 72 hour turn around, please advise

## 2017-09-16 NOTE — Telephone Encounter (Signed)
Morphine refill Last OV:07/05/17 Last refill:08/21/17 60 tab/0 refill OHY:[id number] Pharmacy: [place], [place] [phone] (Phone) [phone] (Fax)

## 2017-09-17 NOTE — Telephone Encounter (Signed)
Check [place] registry last filled 08/21/2017.Marland KitchenJohny Chess

## 2017-09-20 MED ORDER — MORPHINE SULFATE ER 30 MG PO TBCR: 30.0000 mg | EXTENDED_RELEASE_TABLET | Freq: Two times a day (BID) | ORAL | 0 refills | Status: DC

## 2017-10-06 ENCOUNTER — Other Ambulatory Visit (INDEPENDENT_AMBULATORY_CARE_PROVIDER_SITE_OTHER): Payer: PPO

## 2017-10-06 ENCOUNTER — Ambulatory Visit (INDEPENDENT_AMBULATORY_CARE_PROVIDER_SITE_OTHER): Payer: PPO | Admitting: Internal Medicine

## 2017-10-06 ENCOUNTER — Encounter: Payer: Self-pay | Admitting: Internal Medicine

## 2017-10-06 DIAGNOSIS — I10 Essential (primary) hypertension: Secondary | ICD-10-CM | POA: Diagnosis not present

## 2017-10-06 DIAGNOSIS — M5440 Lumbago with sciatica, unspecified side: Secondary | ICD-10-CM

## 2017-10-06 DIAGNOSIS — F411 Generalized anxiety disorder: Secondary | ICD-10-CM | POA: Diagnosis not present

## 2017-10-06 DIAGNOSIS — E119 Type 2 diabetes mellitus without complications: Secondary | ICD-10-CM | POA: Diagnosis not present

## 2017-10-06 DIAGNOSIS — E538 Deficiency of other specified B group vitamins: Secondary | ICD-10-CM

## 2017-10-06 MED ORDER — MORPHINE SULFATE ER 30 MG PO TBCR: 30.0000 mg | EXTENDED_RELEASE_TABLET | Freq: Two times a day (BID) | ORAL | 0 refills | Status: DC

## 2017-10-06 MED ORDER — OXYCODONE HCL 15 MG PO TABS: 15.0000 mg | ORAL_TABLET | Freq: Four times a day (QID) | ORAL | 0 refills | Status: DC | PRN

## 2017-10-06 NOTE — Progress Notes (Signed)
Subjective:  Patient ID: Vernon Bishop, male    DOB: [birth year]  Age: 77 y.o. MRN: [id number]  CC: No chief complaint on file.   HPI Vernon Bishop presents for chronic pain, depression, anxiety f/u  Outpatient Medications Prior to Visit  Medication Sig Dispense Refill  . acetaminophen (TYLENOL) 500 MG tablet Take 2 tablets (1,000 mg total) by mouth every 8 (eight) hours as needed for mild pain. 30 tablet 0  . amLODipine (NORVASC) 10 MG tablet TAKE 1 TABLET BY MOUTH EVERY MORNING. 90 tablet 3  . aspirin EC 81 MG tablet Take 81 mg by mouth daily.    . benazepril-hydrochlorthiazide (LOTENSIN HCT) 20-12.5 MG per tablet TAKE 2 TABLETS BY MOUTH DAILY. 60 tablet 5  . buPROPion (WELLBUTRIN XL) 150 MG 24 hr tablet 3  qam 90 tablet 5  . Cholecalciferol (VITAMIN D3) 1000 UNIT tablet Take 1,000 Units by mouth daily.      . clotrimazole-betamethasone (LOTRISONE) cream Apply 1 application topically 2 (two) times daily. 60 g 1  . Cyanocobalamin (VITAMIN B-12 CR) 1000 MCG TBCR Take 1,000 mcg by mouth daily.      Marland Kitchen docusate sodium 100 MG CAPS Take 100 mg by mouth 2 (two) times daily. 10 capsule 0  . dutasteride (AVODART) 0.5 MG capsule Take 1 capsule (0.5 mg total) by mouth daily. 30 capsule 11  . ferrous sulfate 325 (65 FE) MG tablet Take 1 tablet (325 mg total) by mouth 3 (three) times daily after meals.  3  . FLUoxetine (PROZAC) 40 MG capsule Take 1 capsule (40 mg total) by mouth daily. 90 capsule 3  . gabapentin (NEURONTIN) 300 MG capsule TAKE 1 CAPSULE BY MOUTH THREE TIMES A DAY 90 capsule 3  . ketoconazole (NIZORAL) 2 % shampoo APPLY AS DIRECTED 2 TIMES A WEEK 120 mL 0  . morphine (MS CONTIN) 30 MG 12 hr tablet Take 1 tablet (30 mg total) by mouth every 12 (twelve) hours. 60 tablet 0  . Multiple Vitamin (MULTIVITAMIN) tablet Take 1 tablet by mouth daily.    Marland Kitchen oxyCODONE (ROXICODONE) 15 MG immediate release tablet Take 1 tablet (15 mg total) by mouth every 6 (six) hours as needed for pain. Please  fill on or after 09/15/17 120 tablet 0  . polyethylene glycol (MIRALAX / GLYCOLAX) packet Take 17 g by mouth 2 (two) times daily. 14 each 0  . propranolol (INDERAL) 80 MG tablet TAKE 1 TABLET BY MOUTH 3 TIMES DAILY. 270 tablet 1  . testosterone cypionate (DEPOTESTOSTERONE CYPIONATE) 200 MG/ML injection INJECT 3 MILLILITERS INTO THE MUSCLE EVERY 14 DAYS 10 mL 5  . tiZANidine (ZANAFLEX) 4 MG tablet TAKE 1 TABLET BY MOUTH EVERY 8 HOURS AS NEEDED FOR MUSCLE SPASMS. 60 tablet 2  . traZODone (DESYREL) 50 MG tablet Take 1-2 tablets (50-100 mg total) by mouth at bedtime. 60 tablet 5   No facility-administered medications prior to visit.     ROS: Review of Systems  Constitutional: Positive for fatigue. Negative for appetite change and unexpected weight change.  HENT: Negative for congestion, nosebleeds, sneezing, sore throat and trouble swallowing.   Eyes: Negative for itching and visual disturbance.  Respiratory: Negative for cough.   Cardiovascular: Negative for chest pain, palpitations and leg swelling.  Gastrointestinal: Negative for abdominal distention, blood in stool, diarrhea and nausea.  Genitourinary: Negative for frequency and hematuria.  Musculoskeletal: Positive for back pain and gait problem. Negative for joint swelling and neck pain.  Skin: Negative for rash.  Neurological: Negative  for dizziness, tremors, speech difficulty and weakness.  Psychiatric/Behavioral: Positive for decreased concentration and dysphoric mood. Negative for agitation, sleep disturbance and suicidal ideas. The patient is nervous/anxious.     Objective:  BP 134/76 (BP Location: Left Arm, Patient Position: Sitting, Cuff Size: Large)   Pulse (!) 55   Temp 98.4 F (36.9 C) (Oral)   Ht 5\' 6"  (1.676 m)   Wt 224 lb (101.6 kg)   SpO2 98%   BMI 36.15 kg/m   BP Readings from Last 3 Encounters:  10/06/17 134/76  07/05/17 134/82  04/23/17 126/78    Wt Readings from Last 3 Encounters:  10/06/17 224 lb (101.6  kg)  07/05/17 226 lb (102.5 kg)  04/23/17 227 lb (103 kg)    Physical Exam  Constitutional: He is oriented to person, place, and time. He appears well-developed. No distress.  NAD  HENT:  Mouth/Throat: Oropharynx is clear and moist.  Eyes: Pupils are equal, round, and reactive to light. Conjunctivae are normal.  Neck: Normal range of motion. No JVD present. No thyromegaly present.  Cardiovascular: Normal rate, regular rhythm, normal heart sounds and intact distal pulses. Exam reveals no gallop and no friction rub.  No murmur heard. Pulmonary/Chest: Effort normal and breath sounds normal. No respiratory distress. He has no wheezes. He has no rales. He exhibits no tenderness.  Abdominal: Soft. Bowel sounds are normal. He exhibits no distension and no mass. There is no tenderness. There is no rebound and no guarding.  Musculoskeletal: Normal range of motion. He exhibits tenderness. He exhibits no edema.  Lymphadenopathy:    He has no cervical adenopathy.  Neurological: He is alert and oriented to person, place, and time. He has normal reflexes. No cranial nerve deficit. He exhibits normal muscle tone. He displays a negative Romberg sign. Coordination and gait normal.  Skin: Skin is warm and dry. No rash noted.  Psychiatric: He has a normal mood and affect. His behavior is normal. Judgment and thought content normal.  LS tender  Lab Results  Component Value Date   WBC 4.8 12/30/2016   HGB 14.9 12/30/2016   HCT 43.5 12/30/2016   PLT 188.0 12/30/2016   GLUCOSE 99 12/30/2016   CHOL 136 02/03/2016   TRIG 133.0 02/03/2016   HDL 31.60 (L) 02/03/2016   LDLCALC 78 02/03/2016   ALT 12 12/30/2016   AST 13 12/30/2016   NA 139 12/30/2016   K 4.1 12/30/2016   CL 102 12/30/2016   CREATININE 1.07 12/30/2016   BUN 16 12/30/2016   CO2 30 12/30/2016   TSH 2.64 12/30/2016   PSA 0.45 12/30/2016   INR 1.00 03/07/2013   HGBA1C 5.1 01/10/2014    Dg Hip Unilat With Pelvis 2-3 Views  Left  Result Date: 05/06/2016 CLINICAL DATA:  Fall 1 month ago with persistent left hip pain EXAM: DG HIP (WITH OR WITHOUT PELVIS) 2-3V LEFT COMPARISON:  AP pelvis x-ray dated 14 March 2013 FINDINGS: The bony pelvis is subjectively adequately mineralized. No acute fracture is observed. The observed portions of the sacrum are grossly normal. 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 On the left the hip joint spaces reasonably well-maintained. There is some scleroses of the inferior aspect of the acetabulum. The femoral head and acetabular articular surfaces remain smooth. The femoral neck, intertrochanteric, and subtrochanteric regions are normal. IMPRESSION: There is mild degenerative change of the left hip without significant joint space loss. There is no acute or healing fracture or dislocation. Electronically Signed   By: Shanon Brow  Martinique M.D.   On: 05/06/2016 15:05    Assessment & Plan:   There are no diagnoses linked to this encounter.   No orders of the defined types were placed in this encounter.    Follow-up: No follow-ups on file.  Walker Kehr, MD

## 2017-10-06 NOTE — Assessment & Plan Note (Signed)
Oxycodone, MS-contin   Potential benefits of a long term opioids use as well as potential risks (i.e. addiction risk, apnea etc) and complications (i.e. Somnolence, constipation and others) were explained to the patient and were aknowledged.

## 2017-10-06 NOTE — Assessment & Plan Note (Signed)
  On diet  

## 2017-10-06 NOTE — Assessment & Plan Note (Signed)
Lotensin HCT, Amlodipine, Inderal 

## 2017-10-06 NOTE — Assessment & Plan Note (Signed)
On B12 

## 2017-10-06 NOTE — Assessment & Plan Note (Signed)
F/u w/dr [employer]

## 2017-10-07 LAB — BASIC METABOLIC PANEL
BUN: 13 mg/dL (ref 6–23)
CHLORIDE: 100 meq/L (ref 96–112)
CO2: 29 mEq/L (ref 19–32)
CREATININE: 1.09 mg/dL (ref 0.40–1.50)
Calcium: 9.4 mg/dL (ref 8.4–10.5)
GFR: 69.75 mL/min (ref >60.00)
Glucose, Bld: 87 mg/dL (ref 70–99)
POTASSIUM: 4.6 meq/L (ref 3.5–5.1)
Sodium: 139 mEq/L (ref 135–145)

## 2017-12-09 ENCOUNTER — Other Ambulatory Visit (HOSPITAL_COMMUNITY): Payer: Self-pay | Admitting: Psychiatry

## 2017-12-20 ENCOUNTER — Other Ambulatory Visit: Payer: Self-pay | Admitting: Internal Medicine

## 2018-01-04 ENCOUNTER — Ambulatory Visit (INDEPENDENT_AMBULATORY_CARE_PROVIDER_SITE_OTHER): Payer: PPO | Admitting: Internal Medicine

## 2018-01-04 ENCOUNTER — Encounter: Payer: Self-pay | Admitting: Internal Medicine

## 2018-01-04 ENCOUNTER — Ambulatory Visit: Payer: Self-pay | Admitting: Internal Medicine

## 2018-01-04 VITALS — BP 138/80 | HR 58 | Temp 98.4°F | Ht 66.0 in | Wt 230.0 lb

## 2018-01-04 DIAGNOSIS — F339 Major depressive disorder, recurrent, unspecified: Secondary | ICD-10-CM | POA: Diagnosis not present

## 2018-01-04 DIAGNOSIS — N320 Bladder-neck obstruction: Secondary | ICD-10-CM | POA: Diagnosis not present

## 2018-01-04 DIAGNOSIS — M5440 Lumbago with sciatica, unspecified side: Secondary | ICD-10-CM | POA: Diagnosis not present

## 2018-01-04 DIAGNOSIS — Z23 Encounter for immunization: Secondary | ICD-10-CM

## 2018-01-04 DIAGNOSIS — F411 Generalized anxiety disorder: Secondary | ICD-10-CM | POA: Diagnosis not present

## 2018-01-04 DIAGNOSIS — E538 Deficiency of other specified B group vitamins: Secondary | ICD-10-CM | POA: Diagnosis not present

## 2018-01-04 DIAGNOSIS — E291 Testicular hypofunction: Secondary | ICD-10-CM

## 2018-01-04 MED ORDER — BUPROPION HCL ER (XL) 150 MG PO TB24: ORAL_TABLET | ORAL | 5 refills | Status: DC

## 2018-01-04 MED ORDER — MORPHINE SULFATE ER 30 MG PO TBCR: 30.0000 mg | EXTENDED_RELEASE_TABLET | Freq: Two times a day (BID) | ORAL | 0 refills | Status: DC

## 2018-01-04 MED ORDER — OXYCODONE HCL 15 MG PO TABS: 15.0000 mg | ORAL_TABLET | Freq: Four times a day (QID) | ORAL | 0 refills | Status: DC | PRN

## 2018-01-04 NOTE — Patient Instructions (Addendum)
[facility] w/Jill 03/2018

## 2018-01-04 NOTE — Progress Notes (Signed)
Subjective:  Patient ID: Vernon Bishop, male    DOB: 1940/09/19  Age: 77 y.o. MRN: [id number]  CC: No chief complaint on file.   HPI Vernon Bishop presents for LBP, HTN, depression f/u  Outpatient Medications Prior to Visit  Medication Sig Dispense Refill  . acetaminophen (TYLENOL) 500 MG tablet Take 2 tablets (1,000 mg total) by mouth every 8 (eight) hours as needed for mild pain. 30 tablet 0  . amLODipine (NORVASC) 10 MG tablet TAKE 1 TABLET BY MOUTH EVERY MORNING. 90 tablet 3  . aspirin EC 81 MG tablet Take 81 mg by mouth daily.    . benazepril-hydrochlorthiazide (LOTENSIN HCT) 20-12.5 MG per tablet TAKE 2 TABLETS BY MOUTH DAILY. 60 tablet 5  . buPROPion (WELLBUTRIN XL) 150 MG 24 hr tablet 3  qam 90 tablet 5  . Cholecalciferol (VITAMIN D3) 1000 UNIT tablet Take 1,000 Units by mouth daily.      . clotrimazole-betamethasone (LOTRISONE) cream Apply 1 application topically 2 (two) times daily. 60 g 1  . Cyanocobalamin (VITAMIN B-12 CR) 1000 MCG TBCR Take 1,000 mcg by mouth daily.      Marland Kitchen docusate sodium 100 MG CAPS Take 100 mg by mouth 2 (two) times daily. 10 capsule 0  . dutasteride (AVODART) 0.5 MG capsule Take 1 capsule (0.5 mg total) by mouth daily. 30 capsule 11  . ferrous sulfate 325 (65 FE) MG tablet Take 1 tablet (325 mg total) by mouth 3 (three) times daily after meals.  3  . FLUoxetine (PROZAC) 40 MG capsule Take 1 capsule (40 mg total) by mouth daily. 90 capsule 3  . gabapentin (NEURONTIN) 300 MG capsule TAKE 1 CAPSULE BY MOUTH THREE TIMES A DAY 90 capsule 3  . ketoconazole (NIZORAL) 2 % shampoo APPLY AS DIRECTED 2 TIMES A WEEK 120 mL 0  . morphine (MS CONTIN) 30 MG 12 hr tablet Take 1 tablet (30 mg total) by mouth every 12 (twelve) hours. 60 tablet 0  . Multiple Vitamin (MULTIVITAMIN) tablet Take 1 tablet by mouth daily.    Marland Kitchen oxyCODONE (ROXICODONE) 15 MG immediate release tablet Take 1 tablet (15 mg total) by mouth every 6 (six) hours as needed for pain. Please fill on or  after 12/20/17 120 tablet 0  . polyethylene glycol (MIRALAX / GLYCOLAX) packet Take 17 g by mouth 2 (two) times daily. 14 each 0  . propranolol (INDERAL) 80 MG tablet TAKE 1 TABLET BY MOUTH 3 TIMES DAILY. 270 tablet 1  . testosterone cypionate (DEPOTESTOSTERONE CYPIONATE) 200 MG/ML injection INJECT 3 MILLILITERS INTO THE MUSCLE EVERY 14 DAYS 10 mL 5  . tiZANidine (ZANAFLEX) 4 MG tablet TAKE 1 TABLET BY MOUTH EVERY 8 HOURS AS NEEDED FOR MUSCLE SPASMS. 60 tablet 2  . traZODone (DESYREL) 50 MG tablet Take 1-2 tablets (50-100 mg total) by mouth at bedtime. 60 tablet 5   No facility-administered medications prior to visit.     ROS: Review of Systems  Constitutional: Negative for appetite change, fatigue and unexpected weight change.  HENT: Negative for congestion, nosebleeds, sneezing, sore throat and trouble swallowing.   Eyes: Negative for itching and visual disturbance.  Respiratory: Negative for cough.   Cardiovascular: Negative for chest pain, palpitations and leg swelling.  Gastrointestinal: Negative for abdominal distention, blood in stool, diarrhea and nausea.  Genitourinary: Negative for frequency and hematuria.  Musculoskeletal: Positive for back pain. Negative for gait problem, joint swelling and neck pain.  Skin: Negative for rash.  Neurological: Negative for dizziness, tremors, speech  difficulty and weakness.  Psychiatric/Behavioral: Positive for dysphoric mood. Negative for agitation, sleep disturbance and suicidal ideas. The patient is not nervous/anxious.     Objective:  BP 138/80 (BP Location: Left Arm, Patient Position: Sitting, Cuff Size: Normal)   Pulse (!) 58   Temp 98.4 F (36.9 C) (Oral)   Ht 5\' 6"  (1.676 m)   Wt 230 lb (104.3 kg)   SpO2 97%   BMI 37.12 kg/m   BP Readings from Last 3 Encounters:  01/04/18 138/80  10/06/17 134/76  07/05/17 134/82    Wt Readings from Last 3 Encounters:  01/04/18 230 lb (104.3 kg)  10/06/17 224 lb (101.6 kg)  07/05/17 226 lb  (102.5 kg)    Physical Exam  Constitutional: He is oriented to person, place, and time. He appears well-developed. No distress.  NAD  HENT:  Mouth/Throat: Oropharynx is clear and moist.  Eyes: Pupils are equal, round, and reactive to light. Conjunctivae are normal.  Neck: Normal range of motion. No JVD present. No thyromegaly present.  Cardiovascular: Normal rate, regular rhythm, normal heart sounds and intact distal pulses. Exam reveals no gallop and no friction rub.  No murmur heard. Pulmonary/Chest: Effort normal and breath sounds normal. No respiratory distress. He has no wheezes. He has no rales. He exhibits no tenderness.  Abdominal: Soft. Bowel sounds are normal. He exhibits no distension and no mass. There is no tenderness. There is no rebound and no guarding.  Musculoskeletal: Normal range of motion. He exhibits no edema or tenderness.  Lymphadenopathy:    He has no cervical adenopathy.  Neurological: He is alert and oriented to person, place, and time. He has normal reflexes. No cranial nerve deficit. He exhibits normal muscle tone. He displays a negative Romberg sign. Coordination and gait normal.  Skin: Skin is warm and dry. No rash noted.  Psychiatric: He has a normal mood and affect. His behavior is normal. Judgment and thought content normal.    Lab Results  Component Value Date   WBC 4.8 12/30/2016   HGB 14.9 12/30/2016   HCT 43.5 12/30/2016   PLT 188.0 12/30/2016   GLUCOSE 87 10/06/2017   CHOL 136 02/03/2016   TRIG 133.0 02/03/2016   HDL 31.60 (L) 02/03/2016   LDLCALC 78 02/03/2016   ALT 12 12/30/2016   AST 13 12/30/2016   NA 139 10/06/2017   K 4.6 10/06/2017   CL 100 10/06/2017   CREATININE 1.09 10/06/2017   BUN 13 10/06/2017   CO2 29 10/06/2017   TSH 2.64 12/30/2016   PSA 0.45 12/30/2016   INR 1.00 03/07/2013   HGBA1C 5.1 01/10/2014    Dg Hip Unilat With Pelvis 2-3 Views Left  Result Date: 05/06/2016 CLINICAL DATA:  Fall 1 month ago with persistent  left hip pain EXAM: DG HIP (WITH OR WITHOUT PELVIS) 2-3V LEFT COMPARISON:  AP pelvis x-ray dated 14 March 2013 FINDINGS: The bony pelvis is subjectively adequately mineralized. No acute fracture is observed. The observed portions of the sacrum are grossly normal. 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 On the left the hip joint spaces reasonably well-maintained. There is some scleroses of the inferior aspect of the acetabulum. The femoral head and acetabular articular surfaces remain smooth. The femoral neck, intertrochanteric, and subtrochanteric regions are normal. IMPRESSION: There is mild degenerative change of the left hip without significant joint space loss. There is no acute or healing fracture or dislocation. Electronically Signed   By: David  Martinique M.D.   On: 05/06/2016 15:05  Assessment & Plan:   Diagnoses and all orders for this visit:  Need for influenza vaccination -     Flu vaccine HIGH DOSE PF (Fluzone High dose)     No orders of the defined types were placed in this encounter.    Follow-up: No follow-ups on file.  Walker Kehr, MD

## 2018-01-04 NOTE — Assessment & Plan Note (Addendum)
Wellbutrin XL 350 mg/d, Prozac

## 2018-01-04 NOTE — Assessment & Plan Note (Signed)
Prozac Wellbutrin XL 350 mg/d

## 2018-01-04 NOTE — Assessment & Plan Note (Signed)
Oxycodone, MS-contin, Tizanidine Chronic Potential benefits of a long term opioids use as well as potential risks (i.e. addiction risk, apnea etc) and complications (i.e. Somnolence, constipation and others) were explained to the patient and were aknowledged.  Labs in 2weeks

## 2018-01-04 NOTE — Assessment & Plan Note (Signed)
On B12 

## 2018-01-04 NOTE — Assessment & Plan Note (Signed)
On Testosterone inj  Potential benefits of a long term testosterone  use as well as potential risks  and complications were explained to the patient and were aknowledged.  

## 2018-01-10 ENCOUNTER — Ambulatory Visit: Payer: Self-pay | Admitting: Internal Medicine

## 2018-01-14 DIAGNOSIS — H43812 Vitreous degeneration, left eye: Secondary | ICD-10-CM | POA: Diagnosis not present

## 2018-01-14 DIAGNOSIS — H18413 Arcus senilis, bilateral: Secondary | ICD-10-CM | POA: Diagnosis not present

## 2018-01-14 DIAGNOSIS — H2513 Age-related nuclear cataract, bilateral: Secondary | ICD-10-CM | POA: Diagnosis not present

## 2018-01-14 DIAGNOSIS — H25043 Posterior subcapsular polar age-related cataract, bilateral: Secondary | ICD-10-CM | POA: Diagnosis not present

## 2018-02-07 DIAGNOSIS — H2512 Age-related nuclear cataract, left eye: Secondary | ICD-10-CM | POA: Diagnosis not present

## 2018-02-07 DIAGNOSIS — H02831 Dermatochalasis of right upper eyelid: Secondary | ICD-10-CM | POA: Diagnosis not present

## 2018-02-07 DIAGNOSIS — H43812 Vitreous degeneration, left eye: Secondary | ICD-10-CM | POA: Diagnosis not present

## 2018-02-07 DIAGNOSIS — H04123 Dry eye syndrome of bilateral lacrimal glands: Secondary | ICD-10-CM | POA: Diagnosis not present

## 2018-02-07 DIAGNOSIS — H2513 Age-related nuclear cataract, bilateral: Secondary | ICD-10-CM | POA: Diagnosis not present

## 2018-02-07 DIAGNOSIS — H25043 Posterior subcapsular polar age-related cataract, bilateral: Secondary | ICD-10-CM | POA: Diagnosis not present

## 2018-02-07 DIAGNOSIS — H25013 Cortical age-related cataract, bilateral: Secondary | ICD-10-CM | POA: Diagnosis not present

## 2018-02-14 ENCOUNTER — Other Ambulatory Visit: Payer: Self-pay | Admitting: Internal Medicine

## 2018-02-28 ENCOUNTER — Other Ambulatory Visit: Payer: Self-pay | Admitting: Internal Medicine

## 2018-03-17 DIAGNOSIS — H2512 Age-related nuclear cataract, left eye: Secondary | ICD-10-CM | POA: Diagnosis not present

## 2018-03-18 DIAGNOSIS — H2511 Age-related nuclear cataract, right eye: Secondary | ICD-10-CM | POA: Diagnosis not present

## 2018-04-07 ENCOUNTER — Encounter: Payer: Self-pay | Admitting: Internal Medicine

## 2018-04-07 ENCOUNTER — Ambulatory Visit (INDEPENDENT_AMBULATORY_CARE_PROVIDER_SITE_OTHER): Payer: PPO | Admitting: Internal Medicine

## 2018-04-07 DIAGNOSIS — E119 Type 2 diabetes mellitus without complications: Secondary | ICD-10-CM | POA: Diagnosis not present

## 2018-04-07 DIAGNOSIS — M5440 Lumbago with sciatica, unspecified side: Secondary | ICD-10-CM | POA: Diagnosis not present

## 2018-04-07 DIAGNOSIS — I10 Essential (primary) hypertension: Secondary | ICD-10-CM | POA: Diagnosis not present

## 2018-04-07 DIAGNOSIS — R29898 Other symptoms and signs involving the musculoskeletal system: Secondary | ICD-10-CM | POA: Diagnosis not present

## 2018-04-07 DIAGNOSIS — I480 Paroxysmal atrial fibrillation: Secondary | ICD-10-CM

## 2018-04-07 DIAGNOSIS — E785 Hyperlipidemia, unspecified: Secondary | ICD-10-CM

## 2018-04-07 DIAGNOSIS — H2511 Age-related nuclear cataract, right eye: Secondary | ICD-10-CM | POA: Diagnosis not present

## 2018-04-07 MED ORDER — MORPHINE SULFATE ER 30 MG PO TBCR: 30.0000 mg | EXTENDED_RELEASE_TABLET | Freq: Two times a day (BID) | ORAL | 0 refills | Status: DC

## 2018-04-07 MED ORDER — OXYCODONE HCL 15 MG PO TABS: 15.0000 mg | ORAL_TABLET | Freq: Four times a day (QID) | ORAL | 0 refills | Status: DC | PRN

## 2018-04-07 NOTE — Assessment & Plan Note (Signed)
Oxycodone, MS-contin, Tizanidine Chronic Potential benefits of a long term opioids use as well as potential risks (i.e. addiction risk, apnea etc) and complications (i.e. Somnolence, constipation and others) were explained to the patient and were aknowledged.   

## 2018-04-07 NOTE — Assessment & Plan Note (Signed)
ASA

## 2018-04-07 NOTE — Assessment & Plan Note (Signed)
On diet   cardiac CT scan for calcium scoring option offered

## 2018-04-07 NOTE — Progress Notes (Signed)
Subjective:  Patient ID: Vernon Bishop, male    DOB: 12/23/1940  Age: 78 y.o. MRN: [id number]  CC: No chief complaint on file.   HPI Vernon Bishop presents for LBP, hypogonadism, HTN f/u L arm is weaker  Outpatient Medications Prior to Visit  Medication Sig Dispense Refill  . acetaminophen (TYLENOL) 500 MG tablet Take 2 tablets (1,000 mg total) by mouth every 8 (eight) hours as needed for mild pain. 30 tablet 0  . amLODipine (NORVASC) 10 MG tablet TAKE 1 TABLET BY MOUTH EVERY MORNING. 90 tablet 3  . aspirin EC 81 MG tablet Take 81 mg by mouth daily.    . benazepril-hydrochlorthiazide (LOTENSIN HCT) 20-12.5 MG per tablet TAKE 2 TABLETS BY MOUTH DAILY. 60 tablet 5  . buPROPion (WELLBUTRIN XL) 150 MG 24 hr tablet 3  qam 90 tablet 5  . Cholecalciferol (VITAMIN D3) 1000 UNIT tablet Take 1,000 Units by mouth daily.      . clotrimazole-betamethasone (LOTRISONE) cream Apply 1 application topically 2 (two) times daily. 60 g 1  . Cyanocobalamin (VITAMIN B-12 CR) 1000 MCG TBCR Take 1,000 mcg by mouth daily.      Marland Kitchen docusate sodium 100 MG CAPS Take 100 mg by mouth 2 (two) times daily. 10 capsule 0  . dutasteride (AVODART) 0.5 MG capsule Take 1 capsule (0.5 mg total) by mouth daily. 30 capsule 11  . ferrous sulfate 325 (65 FE) MG tablet Take 1 tablet (325 mg total) by mouth 3 (three) times daily after meals.  3  . FLUoxetine (PROZAC) 40 MG capsule Take 1 capsule (40 mg total) by mouth daily. 90 capsule 3  . gabapentin (NEURONTIN) 300 MG capsule TAKE 1 CAPSULE BY MOUTH THREE TIMES A DAY 90 capsule 2  . ketoconazole (NIZORAL) 2 % shampoo APPLY AS DIRECTED 2 TIMES A WEEK 120 mL 0  . morphine (MS CONTIN) 30 MG 12 hr tablet Take 1 tablet (30 mg total) by mouth every 12 (twelve) hours. 60 tablet 0  . Multiple Vitamin (MULTIVITAMIN) tablet Take 1 tablet by mouth daily.    Marland Kitchen oxyCODONE (ROXICODONE) 15 MG immediate release tablet Take 1 tablet (15 mg total) by mouth every 6 (six) hours as needed for pain.  Please fill on or after 03/21/18 120 tablet 0  . polyethylene glycol (MIRALAX / GLYCOLAX) packet Take 17 g by mouth 2 (two) times daily. 14 each 0  . propranolol (INDERAL) 80 MG tablet TAKE 1 TABLET BY MOUTH 3 TIMES DAILY. 270 tablet 1  . testosterone cypionate (DEPOTESTOSTERONE CYPIONATE) 200 MG/ML injection INJECT 3 MILLILITERS INTO THE MUSCLE EVERY 14 DAYS 10 mL 5  . tiZANidine (ZANAFLEX) 4 MG tablet TAKE 1 TABLET BY MOUTH EVERY 8 HOURS AS NEEDED FOR MUSCLE SPASMS. 60 tablet 2  . traZODone (DESYREL) 50 MG tablet TAKE 1-2 TABLETS BY MOUTH AT BEDTIME. 60 tablet 5   No facility-administered medications prior to visit.     ROS: Review of Systems  Constitutional: Positive for fatigue. Negative for appetite change and unexpected weight change.  HENT: Negative for congestion, nosebleeds, sneezing, sore throat and trouble swallowing.   Eyes: Negative for itching and visual disturbance.  Respiratory: Negative for cough.   Cardiovascular: Negative for chest pain, palpitations and leg swelling.  Gastrointestinal: Negative for abdominal distention, blood in stool, diarrhea and nausea.  Genitourinary: Negative for frequency and hematuria.  Musculoskeletal: Positive for back pain. Negative for gait problem, joint swelling and neck pain.  Skin: Negative for rash.  Neurological: Positive for  weakness. Negative for dizziness, tremors and speech difficulty.  Psychiatric/Behavioral: Positive for dysphoric mood. Negative for agitation, sleep disturbance and suicidal ideas. The patient is nervous/anxious.     Objective:  BP 136/72 (BP Location: Left Arm, Patient Position: Sitting, Cuff Size: Large)   Pulse (!) 51   Temp 98.3 F (36.8 C) (Oral)   Ht 5\' 6"  (1.676 m)   Wt 232 lb (105.2 kg)   SpO2 95%   BMI 37.45 kg/m   BP Readings from Last 3 Encounters:  04/07/18 136/72  01/04/18 138/80  10/06/17 134/76    Wt Readings from Last 3 Encounters:  04/07/18 232 lb (105.2 kg)  01/04/18 230 lb (104.3  kg)  10/06/17 224 lb (101.6 kg)    Physical Exam Constitutional:      General: He is not in acute distress.    Appearance: He is well-developed.     Comments: NAD  Eyes:     Conjunctiva/sclera: Conjunctivae normal.     Pupils: Pupils are equal, round, and reactive to light.  Neck:     Musculoskeletal: Normal range of motion.     Thyroid: No thyromegaly.     Vascular: No JVD.  Cardiovascular:     Rate and Rhythm: Normal rate and regular rhythm.     Heart sounds: Normal heart sounds. No murmur. No friction rub. No gallop.   Pulmonary:     Effort: Pulmonary effort is normal. No respiratory distress.     Breath sounds: Normal breath sounds. No wheezing or rales.  Chest:     Chest wall: No tenderness.  Abdominal:     General: Bowel sounds are normal. There is no distension.     Palpations: Abdomen is soft. There is no mass.     Tenderness: There is no abdominal tenderness. There is no guarding or rebound.  Musculoskeletal: Normal range of motion.        General: No tenderness.  Lymphadenopathy:     Cervical: No cervical adenopathy.  Skin:    General: Skin is warm and dry.     Findings: No rash.  Neurological:     Mental Status: He is alert and oriented to person, place, and time.     Cranial Nerves: No cranial nerve deficit.     Motor: No abnormal muscle tone.     Coordination: Coordination normal.     Gait: Gait normal.     Deep Tendon Reflexes: Reflexes are normal and symmetric.  Psychiatric:        Behavior: Behavior normal.        Thought Content: Thought content normal.        Judgment: Judgment normal.       L hand interosseal muscle loss Lab Results  Component Value Date   WBC 4.8 12/30/2016   HGB 14.9 12/30/2016   HCT 43.5 12/30/2016   PLT 188.0 12/30/2016   GLUCOSE 87 10/06/2017   CHOL 136 02/03/2016   TRIG 133.0 02/03/2016   HDL 31.60 (L) 02/03/2016   LDLCALC 78 02/03/2016   ALT 12 12/30/2016   AST 13 12/30/2016   NA 139 10/06/2017   K 4.6  10/06/2017   CL 100 10/06/2017   CREATININE 1.09 10/06/2017   BUN 13 10/06/2017   CO2 29 10/06/2017   TSH 2.64 12/30/2016   PSA 0.45 12/30/2016   INR 1.00 03/07/2013   HGBA1C 5.1 01/10/2014    Dg Hip Unilat With Pelvis 2-3 Views Left  Result Date: 05/06/2016 CLINICAL DATA:  Fall 1 month  ago with persistent left hip pain EXAM: DG HIP (WITH OR WITHOUT PELVIS) 2-3V LEFT COMPARISON:  AP pelvis x-ray dated 14 March 2013 FINDINGS: The bony pelvis is subjectively adequately mineralized. No acute fracture is observed. The observed portions of the sacrum are grossly normal. 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 On the left the hip joint spaces reasonably well-maintained. There is some scleroses of the inferior aspect of the acetabulum. The femoral head and acetabular articular surfaces remain smooth. The femoral neck, intertrochanteric, and subtrochanteric regions are normal. IMPRESSION: There is mild degenerative change of the left hip without significant joint space loss. There is no acute or healing fracture or dislocation. Electronically Signed   By: David  Martinique M.D.   On: 05/06/2016 15:05    Assessment & Plan:   There are no diagnoses linked to this encounter.   No orders of the defined types were placed in this encounter.    Follow-up: No follow-ups on file.  Walker Kehr, MD

## 2018-04-07 NOTE — Assessment & Plan Note (Signed)
cardiac CT scan for calcium scoring option offered

## 2018-04-07 NOTE — Assessment & Plan Note (Signed)
L arm weakness 2019 L hand interosseal muscle loss 1/20 Neurol ref

## 2018-04-07 NOTE — Patient Instructions (Signed)

## 2018-04-21 ENCOUNTER — Other Ambulatory Visit: Payer: Self-pay | Admitting: Internal Medicine

## 2018-04-21 NOTE — Telephone Encounter (Signed)
Copied from [place] [id number]. Topic: Quick Communication - Rx Refill/Question >> Apr 21, 2018  6:32 PM Windy Kalata wrote: Medication: morphine (MS CONTIN) 30 MG 12 hr , oxyCODONE (ROXICODONE) 15 MG immediate release tablet   Has the patient contacted their pharmacy? Yes.   (Agent: If no, request that the patient contact the pharmacy for the refill.) (Agent: If yes, when and what did the pharmacy advise?) Paper version was dropped off to be filled after the new year and prescription that is over a 7 day supply has to be escribed  Preferred Pharmacy (with phone number or street name): [place], [place] - [address] [place] [phone] (Phone) [phone] (Fax)    Agent: Please be advised that RX refills may take up to 3 business days. We ask that you follow-up with your pharmacy.

## 2018-04-22 NOTE — Telephone Encounter (Signed)
[facility] called and the patient is worried because he will be out tomorrow. The pharmacy can no longer except paper scripts. Please contact them [phone]

## 2018-05-11 ENCOUNTER — Other Ambulatory Visit: Payer: Self-pay | Admitting: Internal Medicine

## 2018-05-19 ENCOUNTER — Other Ambulatory Visit: Payer: Self-pay | Admitting: Internal Medicine

## 2018-05-21 ENCOUNTER — Other Ambulatory Visit: Payer: Self-pay | Admitting: Internal Medicine

## 2018-06-11 ENCOUNTER — Other Ambulatory Visit: Payer: Self-pay | Admitting: Internal Medicine

## 2018-06-16 ENCOUNTER — Other Ambulatory Visit: Payer: Self-pay | Admitting: Internal Medicine

## 2018-06-22 ENCOUNTER — Ambulatory Visit: Payer: Self-pay | Admitting: Diagnostic Neuroimaging

## 2018-06-23 ENCOUNTER — Other Ambulatory Visit: Payer: Self-pay

## 2018-06-23 ENCOUNTER — Other Ambulatory Visit: Payer: Self-pay | Admitting: *Deleted

## 2018-06-26 MED ORDER — TESTOSTERONE CYPIONATE 200 MG/ML IM SOLN: INTRAMUSCULAR | 5 refills | Status: DC

## 2018-06-28 ENCOUNTER — Telehealth: Payer: Self-pay | Admitting: Pulmonary Disease

## 2018-06-28 NOTE — Telephone Encounter (Signed)
Spoke with pt, he is requesting CPAP supplies but he hasn't been seen since 2017. Do you think he can do a televist? The only thing is, he doesn't have download data in [place].I called [facility] and they stated he wasn't in their new system so I was transferred to the respiratory department but there was no answer so I left a message for them to call back. Please advise.

## 2018-06-28 NOTE — Telephone Encounter (Signed)
Called and spoke with patient, he has been scheduled for televisit with Aaron Edelman. Patient also stated his CPAP was 78 years old

## 2018-06-28 NOTE — Telephone Encounter (Signed)
Sounds like patient absolutely will need a tele-visit noted to proceed forward.  May just need to document based off of patient's word.  Please also asked the patient how old his CPAP is.  If it is older than 5 years we may need to get him set up with a newer CPAP. Aaron Edelman

## 2018-06-28 NOTE — Telephone Encounter (Signed)
Ok noted.  Vernon Bishop

## 2018-06-29 ENCOUNTER — Encounter: Payer: Self-pay | Admitting: Pulmonary Disease

## 2018-06-29 ENCOUNTER — Ambulatory Visit (INDEPENDENT_AMBULATORY_CARE_PROVIDER_SITE_OTHER): Payer: PPO | Admitting: Pulmonary Disease

## 2018-06-29 ENCOUNTER — Other Ambulatory Visit: Payer: Self-pay

## 2018-06-29 DIAGNOSIS — G4733 Obstructive sleep apnea (adult) (pediatric): Secondary | ICD-10-CM | POA: Diagnosis not present

## 2018-06-29 DIAGNOSIS — Z9989 Dependence on other enabling machines and devices: Secondary | ICD-10-CM | POA: Diagnosis not present

## 2018-06-29 NOTE — Assessment & Plan Note (Signed)
Assessment: 2002 sleep study shows severe obstructive sleep apnea AHI of 80 CPAP titration shows optimal pressure of 12 cm, the patient prefers to be on auto titrating pressures Last compliance report in [place] was from 2017 that shows excellent compliance Patient reports that he has excellent compliance to his CPAP he just needs to have this office visit in order to obtain supplies  Plan: New order to [place] to get supplies Patient would not like new CPAP at this time Follow-up in 1 year

## 2018-06-29 NOTE — Progress Notes (Signed)
Virtual Visit via Telephone Note  I connected with Vernon Bishop on 06/29/18 at  9:30 AM EDT by telephone and verified that I am speaking with the correct person using two identifiers.   I discussed the limitations, risks, security and privacy concerns of performing an evaluation and management service by telephone and the availability of in person appointments. I also discussed with the patient that there may be a patient responsible charge related to this service. The patient expressed understanding and agreed to proceed.   History of Present Illness:  Patient consented to consult via telephone: Yes  People present and their role in pt care: Pt   Chief complaint: OSA on CPAP   78 year old former smoker followed in our office for severe obstructive sleep apnea based off of 2002 sleep study shows an AHI of 80.  Patient prefers to be on an auto titrating pressure.  Unfortunately were unable to get a compliance download today.  Patient's machine is too old.  We are doing this visit telephonically.  Patient reports that he is having no issues with using his CPAP and he wears it every night.  He reports that he just needs to have this office visit in order to obtain supplies from his DME company [facility].  Patient CPAP is older than 5 years but he does not want a new CPAP at this time.    Observations/Objective:  NPSG 2002:  AHI 80/hr.  CPAP 12cm optimal  Auto 2013:  Optimal pressure 12cm.  >>>Pt has chosen to stay on auto setting >>>Download 07/2014 - AHi 14/h, centrals 6/h, good usage >>>Changed to 12 cm, feels refreshed, no naps  No results found for: NITRICOXIDE  Assessment and Plan:  OSA on CPAP Assessment: 2002 sleep study shows severe obstructive sleep apnea AHI of 80 CPAP titration shows optimal pressure of 12 cm, the patient prefers to be on auto titrating pressures Last compliance report in [place] was from 2017 that shows excellent compliance Patient reports that he  has excellent compliance to his CPAP he just needs to have this office visit in order to obtain supplies  Plan: New order to [place] to get supplies Patient would not like new CPAP at this time Follow-up in 1 year  Follow Up Instructions:  Return in about 1 year (around 06/29/2019), or if symptoms worsen or fail to improve, for Follow up with Dr. Elsworth Soho, Follow up with Wyn Quaker FNP-C.    I discussed the assessment and treatment plan with the patient. The patient was provided an opportunity to ask questions and all were answered. The patient agreed with the plan and demonstrated an understanding of the instructions.   The patient was advised to call back or seek an in-person evaluation if the symptoms worsen or if the condition fails to improve as anticipated.  I provided 22 minutes of non-face-to-face time during this encounter.   Lauraine Rinne, NP

## 2018-06-29 NOTE — Patient Instructions (Addendum)
New order for CPAP supplies  >>> DME: [facility] >>>mask of choice >>> Supplies for a year  If you decide that you would like a new CPAP please contact our office and we can place an order for that  [facility] now [facility] >>>Contact number for them is Dimas Chyle [phone] or [phone] ext [phone]  Return in about 1 year (around 06/29/2019), or if symptoms worsen or fail to improve, for Follow up with Dr. Elsworth Soho, Follow up with Wyn Quaker FNP-C.  We recommend that you continue using your CPAP daily >>>Keep up the hard work using your device >>> Goal should be wearing this for the entire night that you are sleeping, at least 4 to 6 hours  Remember:  . Do not drive or operate heavy machinery if tired or drowsy.  . Please notify the supply company and office if you are unable to use your device regularly due to missing supplies or machine being broken.  . Work on maintaining a healthy weight and following your recommended nutrition plan  . Maintain proper daily exercise and movement  . Maintaining proper use of your device can also help improve management of other chronic illnesses such as: Blood pressure, blood sugars, and weight management.   BiPAP/ CPAP Cleaning:  >>>Clean weekly, with Dawn soap, and bottle brush.  Set up to air dry.   Coronavirus (COVID-19) Are you at risk?  Are you at risk for the Coronavirus (COVID-19)?  To be considered HIGH RISK for Coronavirus (COVID-19), you have to meet the following criteria:  . Traveled to [place], [place], [place], [place] or [place]; or in the [place] to [place], [place], [place], or [place]; and have fever, cough, and shortness of breath within the last 2 weeks of travel OR . Been in close contact with a person diagnosed with COVID-19 within the last 2 weeks and have fever, cough, and shortness of breath . IF YOU DO NOT MEET THESE CRITERIA, YOU ARE CONSIDERED LOW RISK FOR COVID-19.  What to  do if you are HIGH RISK for COVID-19?  Marland Kitchen If you are having a medical emergency, call 911. . Seek medical care right away. Before you go to a doctor's office, urgent care or emergency department, call ahead and tell them about your recent travel, contact with someone diagnosed with COVID-19, and your symptoms. You should receive instructions from your physician's office regarding next steps of care.  . When you arrive at healthcare provider, tell the healthcare staff immediately you have returned from visiting [place], [place], [place], [place] or [place]; or traveled in the [place] to [place], [place], [place], or [place]; in the last two weeks or you have been in close contact with a person diagnosed with COVID-19 in the last 2 weeks.   . Tell the health care staff about your symptoms: fever, cough and shortness of breath. . After you have been seen by a medical provider, you will be either: o Tested for (COVID-19) and discharged home on quarantine except to seek medical care if symptoms worsen, and asked to  - Stay home and avoid contact with others until you get your results (4-5 days)  - Avoid travel on public transportation if possible (such as bus, train, or airplane) or o Sent to the Emergency Department by EMS for evaluation, COVID-19 testing, and possible admission depending on your condition and test results.  What to do if you are LOW RISK  for COVID-19?  Reduce your risk of any infection by using the same precautions used for avoiding the common cold or flu:  Marland Kitchen Wash your hands often with soap and warm water for at least 20 seconds.  If soap and water are not readily available, use an alcohol-based hand sanitizer with at least 60% alcohol.  . If coughing or sneezing, cover your mouth and nose by coughing or sneezing into the elbow areas of your shirt or coat, into a tissue or into your sleeve (not your hands). . Avoid shaking hands with others and consider head nods or verbal  greetings only. . Avoid touching your eyes, nose, or mouth with unwashed hands.  . Avoid close contact with people who are sick. . Avoid places or events with large numbers of people in one location, like concerts or sporting events. . Carefully consider travel plans you have or are making. . If you are planning any travel outside or inside the [place], visit the CDC's Travelers' Health webpage for the latest health notices. . If you have some symptoms but not all symptoms, continue to monitor at home and seek medical attention if your symptoms worsen. . If you are having a medical emergency, call 911.   [place] / e-Visit: [email or web address]         [facility]: [facility]: [phone]                   [facility]: [phone]           It is flu season:   >>> Best ways to protect herself from the flu: Receive the yearly flu vaccine, practice good hand hygiene washing with soap and also using hand sanitizer when available, eat a nutritious meals, get adequate rest, hydrate appropriately   Please contact the office if your symptoms worsen or you have concerns that you are not improving.   Thank you for choosing [facility] for your healthcare, and for allowing [place] to partner with you on your healthcare journey. I am thankful to be able to provide care to you today.   Wyn Quaker FNP-C

## 2018-06-29 NOTE — Telephone Encounter (Signed)
Will close this encounter.  

## 2018-07-02 ENCOUNTER — Other Ambulatory Visit: Payer: Self-pay | Admitting: Internal Medicine

## 2018-07-06 ENCOUNTER — Telehealth: Payer: Self-pay | Admitting: *Deleted

## 2018-07-06 NOTE — Telephone Encounter (Signed)
Called patient to inform them the nurse needs to either convert their upcoming AWV to a virtual visit or reschedule the visit out into the future due to covid-19 safety measures. Patient declined virtual visit PCP follow-up visit and AWV was reschedule on 09/20/18. Patient did not want come to the office due to covid-19 safety precautions.

## 2018-07-07 ENCOUNTER — Ambulatory Visit: Payer: Self-pay | Admitting: Internal Medicine

## 2018-07-07 DIAGNOSIS — G4733 Obstructive sleep apnea (adult) (pediatric): Secondary | ICD-10-CM | POA: Diagnosis not present

## 2018-07-11 ENCOUNTER — Ambulatory Visit: Payer: Self-pay | Admitting: Internal Medicine

## 2018-07-11 ENCOUNTER — Ambulatory Visit: Payer: Self-pay

## 2018-07-18 ENCOUNTER — Other Ambulatory Visit: Payer: Self-pay | Admitting: Internal Medicine

## 2018-07-21 ENCOUNTER — Other Ambulatory Visit: Payer: Self-pay | Admitting: Internal Medicine

## 2018-07-22 NOTE — Telephone Encounter (Signed)
Control database checked last refill: 06/20/2018 LOV: 04/07/2018 NOV:09/20/2018

## 2018-08-17 ENCOUNTER — Other Ambulatory Visit: Payer: Self-pay | Admitting: Internal Medicine

## 2018-09-19 ENCOUNTER — Other Ambulatory Visit: Payer: Self-pay | Admitting: Internal Medicine

## 2018-09-19 NOTE — Progress Notes (Signed)
Subjective:   Vernon Bishop is a 78 y.o. male who presents for Medicare Annual/Subsequent preventive examination.  Review of Systems:   Cardiac Risk Factors include: advanced age (>76men, >50 women);diabetes mellitus;dyslipidemia;male gender;hypertension Sleep patterns: awakens early, gets up 0-1 times nightly to void and sleeps 5 hours nightly. Patient reports insomnia issues, discussed recommended sleep tips and stress reduction tips.   Home Safety/Smoke Alarms: Feels safe in home. Smoke alarms in place.  Living environment; residence and Firearm Safety: 1-story house/ trailer. Lives with wife, no needs for DME, good support system  Seat Belt Safety/Bike Helmet: Wears seat belt.   PSA-  Lab Results  Component Value Date   PSA 0.45 12/30/2016   PSA 0.81 02/03/2016   PSA 0.74 02/13/2015       Objective:    Vitals: There were no vitals taken for this visit.  There is no height or weight on file to calculate BMI.  Advanced Directives 09/20/2018 04/02/2017 01/02/2015 03/14/2013 03/07/2013  Does Patient Have a Medical Advance Directive? Yes Yes Yes Patient has advance directive, copy not in chart Patient has advance directive, copy not in chart  Type of Advance Directive [place];Living will [place];Living will - [place];Living will [employer];Living will  Does patient want to make changes to medical advance directive? - - - No -  Copy of [place] in Chart? No - copy requested No - copy requested - Copy requested from family Copy requested from family  Would patient like information on creating a medical advance directive? - - Yes - Educational materials given - -  Pre-existing out of facility DNR order (yellow form or pink MOST form) - - - No -  Some encounter information is confidential and restricted. Go to Review Flowsheets activity to see all data.    Tobacco Social History    Tobacco Use  Smoking Status Former Smoker  . Packs/day: 0.50  . Years: 12.00  . Pack years: 6.00  . Types: Cigarettes  . Quit date: 03/31/1971  . Years since quitting: 47.5  Smokeless Tobacco Never Used  Tobacco Comment   Started at age 53     Counseling given: Not Answered Comment: Started at age 24  Past Medical History:  Diagnosis Date  . Atrial fibrillation ([place])    1992  . DDD (degenerative disc disease)   . DDD (degenerative disc disease)   . Depression   . Diabetes mellitus    type II  - PT STATES NOT ON ANY DIABETIC MEDICINES  . ED (erectile dysfunction)   . Hyperlipidemia   . Hypertension   . Hypogonadism male   . LBP (low back pain)   . MVP (mitral valve prolapse)   . OSA on CPAP   . Osteoarthritis   . Osteoarthritis, hand    SEVERE LOWER BACK PAIN, AND RESTLESS LEG SYNDROME  . Palpitations   . PTSD (post-traumatic stress disorder)   . Swallowing problem    HX OF PILLS GETTING "STUCK" IN THROAT AT TIMES  . Vitamin D deficiency    Past Surgical History:  Procedure Laterality Date  . APPENDECTOMY    . BACK SURGERY  1987  . CATARACT EXTRACTION    . CERVICAL LAMINECTOMY  2004   [name], rod was placed- HAS ROM LIMITATIONS  . CHOLECYSTECTOMY    . COLONOSCOPY    . KNEE SURGERY     left knee/arthroscopic  . TOTAL HIP ARTHROPLASTY Right  03/14/2013   Procedure: RIGHT TOTAL HIP ARTHROPLASTY ANTERIOR APPROACH;  Surgeon: [name], MD;  Location: [facility];  Service: Orthopedics;  Laterality: Right;   Family History  Problem Relation Age of Onset  . Allergies Mother   . Stroke Mother        Clotting disorders  . Heart disease Mother   . Heart disease Father   . Heart attack Father 10  . Heart disease Brother   . Rheum arthritis Paternal Grandmother   . Other Paternal Grandmother        Rheumatism  . Coronary artery disease Other   . Diabetes Other   . Hypertension Other   . Esophageal cancer Maternal Aunt   . Colon cancer Neg Hx   . Rectal cancer  Neg Hx   . Stomach cancer Neg Hx    Social History   Socioeconomic History  . Marital status: Married    Spouse name: Not on file  . Number of children: 4  . Years of education: Not on file  . Highest education level: Not on file  Occupational History  . 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 Financial resource strain: Not hard at all  . Food insecurity    Worry: Never true    Inability: Never true  . Transportation needs    Medical: No    Non-medical: No  Tobacco Use  . Smoking status: Former Smoker    Packs/day: 0.50    Years: 12.00    Pack years: 6.00    Types: Cigarettes    Quit date: 03/31/1971    Years since quitting: 47.5  . Smokeless tobacco: Never Used  . Tobacco comment: Started at age 85  Substance and Sexual Activity  . Alcohol use: Yes    Alcohol/week: 2.0 standard drinks    Types: 2 Cans of beer per week    Comment: occasionally  . Drug use: No  . Sexual activity: Yes  Lifestyle  . Physical activity    Days per week: 0 days    Minutes per session: 0 min  . Stress: Only a little  Relationships  . Social connections    Talks on phone: More than three times a week    Gets together: More than three times a week    Attends religious service: Not on file    Active member of club or organization: Not on file    Attends meetings of clubs or organizations: Not on file    Relationship status: Married  Other Topics Concern  . Not on file  Social History Narrative   FAMILY HISTORY   History of CAD Male 1st degree relative <50   History Diabetes 1st degree relative   History hypertension                Outpatient Encounter Medications as of 09/20/2018  Medication Sig  . acetaminophen (TYLENOL) 500 MG tablet Take 2 tablets (1,000 mg total) by mouth every 8 (eight) hours as needed for mild pain.  Marland Kitchen amLODipine (NORVASC) 10 MG tablet TAKE 1 TABLET BY MOUTH EVERY MORNING.  Marland Kitchen aspirin EC 81 MG tablet Take 81 mg  by mouth daily.  . benazepril-hydrochlorthiazide (LOTENSIN HCT) 20-12.5 MG per tablet TAKE 2 TABLETS BY MOUTH DAILY.  Marland Kitchen buPROPion (WELLBUTRIN XL) 150 MG 24 hr tablet 3  qam  . Cholecalciferol (VITAMIN D3) 1000 UNIT tablet Take 1,000 Units by mouth daily.    Marland Kitchen  clotrimazole-betamethasone (LOTRISONE) cream APPLY TO AFFECTED AREA 2 TIMES A DAY.  Marland Kitchen Cyanocobalamin (VITAMIN B-12 CR) 1000 MCG TBCR Take 1,000 mcg by mouth daily.    Marland Kitchen docusate sodium 100 MG CAPS Take 100 mg by mouth 2 (two) times daily.  Marland Kitchen dutasteride (AVODART) 0.5 MG capsule TAKE 1 CAPSULE BY MOUTH DAILY.  . ferrous sulfate 325 (65 FE) MG tablet Take 1 tablet (325 mg total) by mouth 3 (three) times daily after meals.  Marland Kitchen FLUoxetine (PROZAC) 40 MG capsule TAKE 1 CAPSULE BY MOUTH DAILY.  Marland Kitchen gabapentin (NEURONTIN) 300 MG capsule TAKE 1 CAPSULE BY MOUTH THREE TIMES A DAY  . ketoconazole (NIZORAL) 2 % shampoo APPLY AS DIRECTED 2 TIMES A WEEK  . morphine (MS CONTIN) 30 MG 12 hr tablet TAKE 1 TABLET BY MOUTH EVERY 12 HOURS.  . Multiple Vitamin (MULTIVITAMIN) tablet Take 1 tablet by mouth daily.  Marland Kitchen oxyCODONE (ROXICODONE) 15 MG immediate release tablet TAKE 1 TABLET BY MOUTH EVERY 6 HOURS AS NEEDED FOR PAIN.  Marland Kitchen polyethylene glycol (MIRALAX / GLYCOLAX) packet Take 17 g by mouth 2 (two) times daily.  . propranolol (INDERAL) 80 MG tablet TAKE 1 TABLET BY MOUTH 3 TIMES DAILY.  Marland Kitchen testosterone cypionate (DEPOTESTOSTERONE CYPIONATE) 200 MG/ML injection INJECT 3 MILLILITERS INTO THE MUSCLE EVERY 14 DAYS  . tiZANidine (ZANAFLEX) 4 MG tablet TAKE 1 TABLET BY MOUTH EVERY 8 HOURS AS NEEDED FOR MUSCLE SPASMS.  Marland Kitchen traZODone (DESYREL) 50 MG tablet TAKE 1-2 TABLETS BY MOUTH AT BEDTIME.  . [DISCONTINUED] morphine (MS CONTIN) 30 MG 12 hr tablet TAKE 1 TABLET BY MOUTH EVERY 12 HOURS.  . [DISCONTINUED] oxyCODONE (ROXICODONE) 15 MG immediate release tablet TAKE 1 TABLET BY MOUTH EVERY 6 HOURS AS NEEDED FOR PAIN.   No facility-administered encounter medications on file  as of 09/20/2018.     Activities of Daily Living In your present state of health, do you have any difficulty performing the following activities: 09/20/2018  Hearing? N  Vision? N  Difficulty concentrating or making decisions? N  Walking or climbing stairs? Y  Dressing or bathing? N  Doing errands, shopping? N  Preparing Food and eating ? N  Using the Toilet? N  In the past six months, have you accidently leaked urine? N  Do you have problems with loss of bowel control? N  Managing your Medications? N  Managing your Finances? N  Housekeeping or managing your Housekeeping? N  Some recent data might be hidden    Patient Care Team: [name], [name], MD as PCP - General (Internal Medicine) Paralee Cancel, MD as Consulting Physician (Orthopedic Surgery) Norma Fredrickson, MD as Consulting Physician (Psychiatry)   Assessment:   This is a routine wellness examination for [facility]. Physical assessment deferred to PCP.   Exercise Activities and Dietary recommendations Current Exercise Habits: The patient does not participate in regular exercise at present, Exercise limited by: orthopedic condition(s)  Diet (meal preparation, eat out, water intake, caffeinated beverages, dairy products, fruits and vegetables): in general, a "healthy" diet  , on average, 1 meals per day. Reports poor appetite.    Discussed supplementing with Ensure, samples and coupons provided. Reviewed heart healthy and diabetic diet. Encouraged patient to increase daily water and healthy fluid intake.  Goals    . Exercise 150 minutes per week (moderate activity)     Thinking about joining the [facility] and considering pool exercise; Suggested stretching exercise     . Patient Stated     Increase my physical activity by  stretching       Fall Risk Fall Risk  09/20/2018 09/20/2018 04/02/2017 04/02/2017 02/03/2016  Falls in the past year? 1 1 Yes Yes No  Number falls in past yr: 0 0 1 2 or more -  Injury with Fall? 0 0 No No  -  Risk for fall due to : Impaired balance/gait;Impaired mobility - - - -  Follow up Falls prevention discussed Falls evaluation completed - - -    Depression Screen PHQ 2/9 Scores 09/20/2018 04/02/2017 11/02/2016 02/03/2016  PHQ - 2 Score 2 5 3 4   PHQ- 9 Score 7 16 6  -    Cognitive Function MMSE - Mini Mental State Exam 04/02/2017  Not completed: Unable to complete  Orientation to time 5  Orientation to Place 5       Ad8 score reviewed for issues:  Issues making decisions: no  Less interest in hobbies / activities: no  Repeats questions, stories (family complaining): no  Trouble using ordinary gadgets (microwave, computer, phone):no  Forgets the month or year: no  Mismanaging finances: no  Remembering appts: no  Daily problems with thinking and/or memory: 1 Ad8 score is= 1  Reports poor STM  Immunization History  Administered Date(s) Administered  . Influenza Split 01/28/2011, 12/18/2011  . Influenza Whole 02/14/2007, 12/11/2009  . Influenza, High Dose Seasonal PF 02/03/2016, 12/30/2016, 01/04/2018  . Influenza,inj,Quad PF,6+ Mos 01/03/2013, 01/10/2014, 12/05/2014  . Pneumococcal Conjugate-13 05/08/2013  . Pneumococcal Polysaccharide-23 08/27/2009  . Td 03/30/2008  . Zoster 04/13/2014    Screening Tests Health Maintenance  Topic Date Due  . HEMOGLOBIN A1C  07/12/2014  . OPHTHALMOLOGY EXAM  12/28/2017  . TETANUS/TDAP  03/30/2018  . FOOT EXAM  04/02/2018  . INFLUENZA VACCINE  10/29/2018  . PNA vac Low Risk Adult  Completed        Plan:    Reviewed health maintenance screenings with patient today and relevant education, vaccines, and/or referrals were provided.   Continue doing brain stimulating activities (puzzles, reading, adult coloring books, staying active) to keep memory sharp.   Continue to eat heart healthy diet (full of fruits, vegetables, whole grains, lean protein, water--limit salt, fat, and sugar intake) and increase physical activity as  tolerated.  I have personally reviewed and noted the following in the patient's chart:   . Medical and social history . Use of alcohol, tobacco or illicit drugs  . Current medications and supplements . Functional ability and status . Nutritional status . Physical activity . Advanced directives . List of other physicians . Vitals . Screenings to include cognitive, depression, and falls . Referrals and appointments  In addition, I have reviewed and discussed with patient certain preventive protocols, quality metrics, and best practice recommendations. A written personalized care plan for preventive services as well as general preventive health recommendations were provided to patient.     Michiel Cowboy, RN  09/20/2018

## 2018-09-20 ENCOUNTER — Other Ambulatory Visit: Payer: Self-pay

## 2018-09-20 ENCOUNTER — Ambulatory Visit (INDEPENDENT_AMBULATORY_CARE_PROVIDER_SITE_OTHER): Payer: PPO | Admitting: Internal Medicine

## 2018-09-20 ENCOUNTER — Ambulatory Visit (INDEPENDENT_AMBULATORY_CARE_PROVIDER_SITE_OTHER): Payer: PPO | Admitting: *Deleted

## 2018-09-20 ENCOUNTER — Encounter: Payer: Self-pay | Admitting: Internal Medicine

## 2018-09-20 VITALS — BP 154/72 | HR 52 | Ht 66.0 in | Wt 223.0 lb

## 2018-09-20 DIAGNOSIS — M5440 Lumbago with sciatica, unspecified side: Secondary | ICD-10-CM

## 2018-09-20 DIAGNOSIS — I480 Paroxysmal atrial fibrillation: Secondary | ICD-10-CM

## 2018-09-20 DIAGNOSIS — I10 Essential (primary) hypertension: Secondary | ICD-10-CM

## 2018-09-20 DIAGNOSIS — E119 Type 2 diabetes mellitus without complications: Secondary | ICD-10-CM | POA: Diagnosis not present

## 2018-09-20 DIAGNOSIS — E785 Hyperlipidemia, unspecified: Secondary | ICD-10-CM | POA: Diagnosis not present

## 2018-09-20 DIAGNOSIS — E538 Deficiency of other specified B group vitamins: Secondary | ICD-10-CM | POA: Diagnosis not present

## 2018-09-20 DIAGNOSIS — E291 Testicular hypofunction: Secondary | ICD-10-CM

## 2018-09-20 DIAGNOSIS — Z0000 Encounter for general adult medical examination without abnormal findings: Secondary | ICD-10-CM

## 2018-09-20 NOTE — Assessment & Plan Note (Signed)
On Testosterone inj  Potential benefits of a long term testosterone  use as well as potential risks  and complications were explained to the patient and were aknowledged.

## 2018-09-20 NOTE — Assessment & Plan Note (Signed)
On B12 

## 2018-09-20 NOTE — Assessment & Plan Note (Signed)
Oxycodone, MS-contin, Tizanidine Chronic Potential benefits of a long term opioids use as well as potential risks (i.e. addiction risk, apnea etc) and complications (i.e. Somnolence, constipation and others) were explained to the patient and were aknowledged.

## 2018-09-20 NOTE — Assessment & Plan Note (Signed)
ASA

## 2018-09-20 NOTE — Patient Instructions (Addendum)
If you cannot attend class in person, you can still exercise at home. Video taped versions of [facility] classes are shown on [employer] (GTN) at 8 am and 1 pm Mondays through Fridays. You can also purchase a copy of the [facility] DVD by calling [place] (GTN) [employer]. GTN is available on [employer] channel 13 with a digital cable box and on [employer] channel 31. GTN is also available on [employer], channel 99. To view GTN, go to channel 99, press OK, select [place], then select GTN to start the channel.  Continue doing brain stimulating activities (puzzles, reading, adult coloring books, staying active) to keep memory sharp.   Continue to eat heart healthy diet (full of fruits, vegetables, whole grains, lean protein, water--limit salt, fat, and sugar intake) and increase physical activity as tolerated.   Vernon Bishop , Thank you for taking time to come for your Medicare Wellness Visit. I appreciate your ongoing commitment to your health goals. Please review the following plan we discussed and let me know if I can assist you in the future.   These are the goals we discussed: Goals    . Exercise 150 minutes per week (moderate activity)     Thinking about joining the [facility] and considering pool exercise; Suggested stretching exercise        This is a list of the screening recommended for you and due dates:  Health Maintenance  Topic Date Due  . Hemoglobin A1C  07/12/2014  . Eye exam for diabetics  12/28/2017  . Tetanus Vaccine  03/30/2018  . Complete foot exam   04/02/2018  . Flu Shot  10/29/2018  . Pneumonia vaccines  Completed    Preventive Care 24 Years and Older, Male Preventive care refers to lifestyle choices and visits with your health care provider that can promote health and wellness. What does preventive care include?   A yearly physical exam. This is also called an annual well check.  Dental exams once or twice a  year.  Routine eye exams. Ask your health care provider how often you should have your eyes checked.  Personal lifestyle choices, including: ? Daily care of your teeth and gums. ? Regular physical activity. ? Eating a healthy diet. ? Avoiding tobacco and drug use. ? Limiting alcohol use. ? Practicing safe sex. ? Taking low doses of aspirin every day. ? Taking vitamin and mineral supplements as recommended by your health care provider. What happens during an annual well check? The services and screenings done by your health care provider during your annual well check will depend on your age, overall health, lifestyle risk factors, and family history of disease. Counseling Your health care provider may ask you questions about your:  Alcohol use.  Tobacco use.  Drug use.  Emotional well-being.  Home and relationship well-being.  Sexual activity.  Eating habits.  History of falls.  Memory and ability to understand (cognition).  Work and work Statistician. Screening You may have the following tests or measurements:  Height, weight, and BMI.  Blood pressure.  Lipid and cholesterol levels. These may be checked every 5 years, or more frequently if you are over 86 years old.  Skin check.  Lung cancer screening. You may have this screening every year starting at age 76 if you have a 30-pack-year history of smoking and currently smoke or have quit within the past 15 years.  Colorectal cancer screening. All adults should have this screening starting at age 38 and  continuing until age 82. You will have tests every 1-10 years, depending on your results and the type of screening test. People at increased risk should start screening at an earlier age. Screening tests may include: ? Guaiac-based fecal occult blood testing. ? Fecal immunochemical test (FIT). ? Stool DNA test. ? Virtual colonoscopy. ? Sigmoidoscopy. During this test, a flexible tube with a tiny camera  (sigmoidoscope) is used to examine your rectum and lower colon. The sigmoidoscope is inserted through your anus into your rectum and lower colon. ? Colonoscopy. During this test, a long, thin, flexible tube with a tiny camera (colonoscope) is used to examine your entire colon and rectum.  Prostate cancer screening. Recommendations will vary depending on your family history and other risks.  Hepatitis C blood test.  Hepatitis B blood test.  Sexually transmitted disease (STD) testing.  Diabetes screening. This is done by checking your blood sugar (glucose) after you have not eaten for a while (fasting). You may have this done every 1-3 years.  Abdominal aortic aneurysm (AAA) screening. You may need this if you are a current or former smoker.  Osteoporosis. You may be screened starting at age 40 if you are at high risk. Talk with your health care provider about your test results, treatment options, and if necessary, the need for more tests. Vaccines Your health care provider may recommend certain vaccines, such as:  Influenza vaccine. This is recommended every year.  Tetanus, diphtheria, and acellular pertussis (Tdap, Td) vaccine. You may need a Td booster every 10 years.  Varicella vaccine. You may need this if you have not been vaccinated.  Zoster vaccine. You may need this after age 54.  Measles, mumps, and rubella (MMR) vaccine. You may need at least one dose of MMR if you were born in 1957 or later. You may also need a second dose.  Pneumococcal 13-valent conjugate (PCV13) vaccine. One dose is recommended after age 43.  Pneumococcal polysaccharide (PPSV23) vaccine. One dose is recommended after age 13.  Meningococcal vaccine. You may need this if you have certain conditions.  Hepatitis A vaccine. You may need this if you have certain conditions or if you travel or work in places where you may be exposed to hepatitis A.  Hepatitis B vaccine. You may need this if you have  certain conditions or if you travel or work in places where you may be exposed to hepatitis B.  Haemophilus influenzae type b (Hib) vaccine. You may need this if you have certain risk factors. Talk to your health care provider about which screenings and vaccines you need and how often you need them. This information is not intended to replace advice given to you by your health care provider. Make sure you discuss any questions you have with your health care provider. Document Released: 04/12/2015 Document Revised: 05/06/2017 Document Reviewed: 01/15/2015 Elsevier Interactive Patient Education  2019 [employer].

## 2018-09-20 NOTE — Progress Notes (Signed)
Subjective:  Patient ID: Vernon Bishop, male    DOB: 1940-12-18  Age: 78 y.o. MRN: [id number]  CC: No chief complaint on file.   HPI Vernon Bishop presents for LBP, HTN, depression  Outpatient Medications Prior to Visit  Medication Sig Dispense Refill  . acetaminophen (TYLENOL) 500 MG tablet Take 2 tablets (1,000 mg total) by mouth every 8 (eight) hours as needed for mild pain. 30 tablet 0  . amLODipine (NORVASC) 10 MG tablet TAKE 1 TABLET BY MOUTH EVERY MORNING. 90 tablet 3  . aspirin EC 81 MG tablet Take 81 mg by mouth daily.    . benazepril-hydrochlorthiazide (LOTENSIN HCT) 20-12.5 MG per tablet TAKE 2 TABLETS BY MOUTH DAILY. 60 tablet 5  . buPROPion (WELLBUTRIN XL) 150 MG 24 hr tablet 3  qam 90 tablet 5  . Cholecalciferol (VITAMIN D3) 1000 UNIT tablet Take 1,000 Units by mouth daily.      . clotrimazole-betamethasone (LOTRISONE) cream APPLY TO AFFECTED AREA 2 TIMES A DAY. 45 g 1  . Cyanocobalamin (VITAMIN B-12 CR) 1000 MCG TBCR Take 1,000 mcg by mouth daily.      Marland Kitchen docusate sodium 100 MG CAPS Take 100 mg by mouth 2 (two) times daily. 10 capsule 0  . dutasteride (AVODART) 0.5 MG capsule TAKE 1 CAPSULE BY MOUTH DAILY. 30 capsule 11  . ferrous sulfate 325 (65 FE) MG tablet Take 1 tablet (325 mg total) by mouth 3 (three) times daily after meals.  3  . FLUoxetine (PROZAC) 40 MG capsule TAKE 1 CAPSULE BY MOUTH DAILY. 90 capsule 3  . gabapentin (NEURONTIN) 300 MG capsule TAKE 1 CAPSULE BY MOUTH THREE TIMES A DAY 90 capsule 2  . ketoconazole (NIZORAL) 2 % shampoo APPLY AS DIRECTED 2 TIMES A WEEK 120 mL 0  . morphine (MS CONTIN) 30 MG 12 hr tablet TAKE 1 TABLET BY MOUTH EVERY 12 HOURS. 60 tablet 0  . Multiple Vitamin (MULTIVITAMIN) tablet Take 1 tablet by mouth daily.    Marland Kitchen oxyCODONE (ROXICODONE) 15 MG immediate release tablet TAKE 1 TABLET BY MOUTH EVERY 6 HOURS AS NEEDED FOR PAIN. 120 tablet 0  . polyethylene glycol (MIRALAX / GLYCOLAX) packet Take 17 g by mouth 2 (two) times daily. 14  each 0  . propranolol (INDERAL) 80 MG tablet TAKE 1 TABLET BY MOUTH 3 TIMES DAILY. 270 tablet 3  . testosterone cypionate (DEPOTESTOSTERONE CYPIONATE) 200 MG/ML injection INJECT 3 MILLILITERS INTO THE MUSCLE EVERY 14 DAYS 10 mL 5  . tiZANidine (ZANAFLEX) 4 MG tablet TAKE 1 TABLET BY MOUTH EVERY 8 HOURS AS NEEDED FOR MUSCLE SPASMS. 60 tablet 2  . traZODone (DESYREL) 50 MG tablet TAKE 1-2 TABLETS BY MOUTH AT BEDTIME. 60 tablet 5   No facility-administered medications prior to visit.     ROS: Review of Systems  Constitutional: Positive for fatigue. Negative for appetite change and unexpected weight change.  HENT: Negative for congestion, nosebleeds, sneezing, sore throat and trouble swallowing.   Eyes: Negative for itching and visual disturbance.  Respiratory: Negative for cough.   Cardiovascular: Negative for chest pain, palpitations and leg swelling.  Gastrointestinal: Negative for abdominal distention, blood in stool, diarrhea and nausea.  Genitourinary: Negative for frequency and hematuria.  Musculoskeletal: Positive for arthralgias and back pain. Negative for gait problem, joint swelling and neck pain.  Skin: Negative for rash.  Neurological: Positive for weakness. Negative for dizziness, tremors and speech difficulty.  Psychiatric/Behavioral: Positive for dysphoric mood. Negative for agitation, sleep disturbance and suicidal ideas. The  patient is nervous/anxious.     Objective:  BP (!) 154/72 (BP Location: Left Arm, Patient Position: Sitting, Cuff Size: Large)   Pulse (!) 52   Temp 98.8 F (37.1 C) (Oral)   Ht 5\' 6"  (1.676 m)   Wt 223 lb (101.2 kg)   SpO2 96%   BMI 35.99 kg/m   BP Readings from Last 3 Encounters:  09/20/18 (!) 154/72  04/07/18 136/72  01/04/18 138/80    Wt Readings from Last 3 Encounters:  09/20/18 223 lb (101.2 kg)  04/07/18 232 lb (105.2 kg)  01/04/18 230 lb (104.3 kg)    Physical Exam Constitutional:      General: He is not in acute distress.     Appearance: He is well-developed.     Comments: NAD  Eyes:     Conjunctiva/sclera: Conjunctivae normal.     Pupils: Pupils are equal, round, and reactive to light.  Neck:     Musculoskeletal: Normal range of motion.     Thyroid: No thyromegaly.     Vascular: No JVD.  Cardiovascular:     Rate and Rhythm: Normal rate and regular rhythm.     Heart sounds: Normal heart sounds. No murmur. No friction rub. No gallop.   Pulmonary:     Effort: Pulmonary effort is normal. No respiratory distress.     Breath sounds: Normal breath sounds. No wheezing or rales.  Chest:     Chest wall: No tenderness.  Abdominal:     General: Bowel sounds are normal. There is no distension.     Palpations: Abdomen is soft. There is no mass.     Tenderness: There is no abdominal tenderness. There is no guarding or rebound.  Musculoskeletal: Normal range of motion.        General: Tenderness present.  Lymphadenopathy:     Cervical: No cervical adenopathy.  Skin:    General: Skin is warm and dry.     Findings: No rash.  Neurological:     Mental Status: He is alert and oriented to person, place, and time.     Cranial Nerves: No cranial nerve deficit.     Motor: No abnormal muscle tone.     Coordination: Coordination normal.     Gait: Gait abnormal.     Deep Tendon Reflexes: Reflexes are normal and symmetric.  Psychiatric:        Behavior: Behavior normal.        Thought Content: Thought content normal.        Judgment: Judgment normal.   LS painful  Lab Results  Component Value Date   WBC 4.8 12/30/2016   HGB 14.9 12/30/2016   HCT 43.5 12/30/2016   PLT 188.0 12/30/2016   GLUCOSE 87 10/06/2017   CHOL 136 02/03/2016   TRIG 133.0 02/03/2016   HDL 31.60 (L) 02/03/2016   LDLCALC 78 02/03/2016   ALT 12 12/30/2016   AST 13 12/30/2016   NA 139 10/06/2017   K 4.6 10/06/2017   CL 100 10/06/2017   CREATININE 1.09 10/06/2017   BUN 13 10/06/2017   CO2 29 10/06/2017   TSH 2.64 12/30/2016   PSA 0.45  12/30/2016   INR 1.00 03/07/2013   HGBA1C 5.1 01/10/2014    Dg Hip Unilat With Pelvis 2-3 Views Left  Result Date: 05/06/2016 CLINICAL DATA:  Fall 1 month ago with persistent left hip pain EXAM: DG HIP (WITH OR WITHOUT PELVIS) 2-3V LEFT COMPARISON:  AP pelvis x-ray dated 14 March 2013 FINDINGS: The bony  pelvis is subjectively adequately mineralized. No acute fracture is observed. The observed portions of the sacrum are grossly normal. 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 On the left the hip joint spaces reasonably well-maintained. There is some scleroses of the inferior aspect of the acetabulum. The femoral head and acetabular articular surfaces remain smooth. The femoral neck, intertrochanteric, and subtrochanteric regions are normal. IMPRESSION: There is mild degenerative change of the left hip without significant joint space loss. There is no acute or healing fracture or dislocation. Electronically Signed   By: David  Martinique M.D.   On: 05/06/2016 15:05    Assessment & Plan:   There are no diagnoses linked to this encounter.   No orders of the defined types were placed in this encounter.    Follow-up: No follow-ups on file.  Walker Kehr, MD

## 2018-09-20 NOTE — Telephone Encounter (Signed)
[place] Controlled Database Checked Last filled: Morphine 08/23/18 # 60                  Oxy 08/17/18 # 120 LOV w/you: 04/07/18 Next appt w/you: 09/20/18

## 2018-09-20 NOTE — Assessment & Plan Note (Signed)
Lotensin HCT, Amlodipine, Inderal

## 2018-09-20 NOTE — Assessment & Plan Note (Signed)
Chronic, diet controlled. 

## 2018-09-20 NOTE — Assessment & Plan Note (Signed)
ASA 162 mg

## 2018-09-21 ENCOUNTER — Other Ambulatory Visit: Payer: Self-pay | Admitting: Internal Medicine

## 2018-10-17 ENCOUNTER — Other Ambulatory Visit: Payer: Self-pay | Admitting: Internal Medicine

## 2018-10-17 NOTE — Telephone Encounter (Signed)
[place] Controlled Database Checked Last filled: Oxy 09/20/18 # 120       Morphine 09/20/18 # 60 LOV w/you: 09/20/18 Next appt w/you: 12/22/18

## 2018-10-19 ENCOUNTER — Other Ambulatory Visit (INDEPENDENT_AMBULATORY_CARE_PROVIDER_SITE_OTHER): Payer: PPO

## 2018-10-19 ENCOUNTER — Telehealth: Payer: Self-pay | Admitting: *Deleted

## 2018-10-19 ENCOUNTER — Other Ambulatory Visit: Payer: Self-pay | Admitting: Internal Medicine

## 2018-10-19 DIAGNOSIS — F411 Generalized anxiety disorder: Secondary | ICD-10-CM

## 2018-10-19 DIAGNOSIS — E291 Testicular hypofunction: Secondary | ICD-10-CM

## 2018-10-19 DIAGNOSIS — N320 Bladder-neck obstruction: Secondary | ICD-10-CM | POA: Diagnosis not present

## 2018-10-19 LAB — HEPATIC FUNCTION PANEL
ALT: 31 U/L (ref 0–53)
AST: 22 U/L (ref 0–37)
Albumin: 4.4 g/dL (ref 3.5–5.2)
Alkaline Phosphatase: 86 U/L (ref 39–117)
Bilirubin, Direct: 0.2 mg/dL (ref 0.0–0.3)
Total Bilirubin: 0.8 mg/dL (ref 0.2–1.2)
Total Protein: 6.9 g/dL (ref 6.0–8.3)

## 2018-10-19 LAB — BASIC METABOLIC PANEL
BUN: 18 mg/dL (ref 6–23)
CO2: 25 mEq/L (ref 19–32)
Calcium: 9.3 mg/dL (ref 8.4–10.5)
Chloride: 104 mEq/L (ref 96–112)
Creatinine, Ser: 0.92 mg/dL (ref 0.40–1.50)
GFR: 79.59 mL/min (ref >60.00)
Glucose, Bld: 124 mg/dL — ABNORMAL HIGH (ref 70–99)
Potassium: 4.1 mEq/L (ref 3.5–5.1)
Sodium: 138 mEq/L (ref 135–145)

## 2018-10-19 LAB — URINALYSIS
Bilirubin Urine: NEGATIVE
Hgb urine dipstick: NEGATIVE
Ketones, ur: NEGATIVE
Leukocytes,Ua: NEGATIVE
Nitrite: NEGATIVE
Specific Gravity, Urine: 1.01 (ref 1.000–1.030)
Total Protein, Urine: NEGATIVE
Urine Glucose: NEGATIVE
Urobilinogen, UA: 0.2 (ref 0.0–1.0)
pH: 6 (ref 5.0–8.0)

## 2018-10-19 LAB — CBC WITH DIFFERENTIAL/PLATELET
Basophils Absolute: 0 10*3/uL (ref 0.0–0.1)
Basophils Relative: 0.4 % (ref 0.0–3.0)
Eosinophils Absolute: 0 10*3/uL (ref 0.0–0.7)
Eosinophils Relative: 0.6 % (ref 0.0–5.0)
HCT: 48.1 % (ref 39.0–52.0)
Hemoglobin: 16.2 g/dL (ref 13.0–17.0)
Lymphocytes Relative: 19.7 % (ref 12.0–46.0)
Lymphs Abs: 1.1 10*3/uL (ref 0.7–4.0)
MCHC: 33.6 g/dL (ref 30.0–36.0)
MCV: 90.8 fl (ref 78.0–100.0)
Monocytes Absolute: 0.4 10*3/uL (ref 0.1–1.0)
Monocytes Relative: 6.1 % (ref 3.0–12.0)
Neutro Abs: 4.2 10*3/uL (ref 1.4–7.7)
Neutrophils Relative %: 73.2 % (ref 43.0–77.0)
Platelets: 190 10*3/uL (ref 150.0–400.0)
RBC: 5.3 Mil/uL (ref 4.22–5.81)
RDW: 13.9 % (ref 11.5–15.5)
WBC: 5.8 10*3/uL (ref 4.0–10.5)

## 2018-10-19 LAB — LIPID PANEL
Cholesterol: 133 mg/dL (ref 0–200)
HDL: 36.9 mg/dL — ABNORMAL LOW (ref >39.00)
LDL Cholesterol: 81 mg/dL (ref 0–99)
NonHDL: 95.6
Total CHOL/HDL Ratio: 4
Triglycerides: 74 mg/dL (ref 0.0–149.0)
VLDL: 14.8 mg/dL (ref 0.0–40.0)

## 2018-10-19 LAB — TSH: TSH: 2.16 u[IU]/mL (ref 0.35–4.50)

## 2018-10-19 LAB — PSA: PSA: 1.01 ng/mL (ref 0.10–4.00)

## 2018-10-19 LAB — TESTOSTERONE: Testosterone: 166.24 ng/dL — ABNORMAL LOW (ref 300.00–890.00)

## 2018-10-19 NOTE — Telephone Encounter (Signed)
Left message for patient to call back to schedule OV with PCP  With labs and drug screen per PCP. CRM created.

## 2018-10-19 NOTE — Telephone Encounter (Signed)
Patient is going to come in for his labs today, he states his refill runs out tomorrow.

## 2018-10-19 NOTE — Telephone Encounter (Signed)
-----   Message from Cassandria Anger, MD sent at 10/18/2018  8:48 PM EDT ----- Marzetta Board,  please inform the patient that he needs to do urine drug screen and blood work before we refill his meds..  Thank you

## 2018-10-21 LAB — PMP SCREEN PROFILE (10S), URINE
Amphetamine Scrn, Ur: NEGATIVE ng/mL
BARBITURATE SCREEN URINE: NEGATIVE ng/mL
BENZODIAZEPINE SCREEN, URINE: NEGATIVE ng/mL
CANNABINOIDS UR QL SCN: NEGATIVE ng/mL
Cocaine (Metab) Scrn, Ur: NEGATIVE ng/mL
Creatinine(Crt), U: 77.6 mg/dL (ref 20.0–300.0)
Methadone Screen, Urine: NEGATIVE ng/mL
OXYCODONE+OXYMORPHONE UR QL SCN: NEGATIVE ng/mL
Opiate Scrn, Ur: POSITIVE ng/mL — AB
Ph of Urine: 5.8 (ref 4.5–8.9)
Phencyclidine Qn, Ur: NEGATIVE ng/mL
Propoxyphene Scrn, Ur: NEGATIVE ng/mL

## 2018-10-21 NOTE — Telephone Encounter (Signed)
Patient calling to make sure PCP has reviewed his labs and checking the status of his refills. Please advise.

## 2018-10-25 ENCOUNTER — Telehealth: Payer: Self-pay | Admitting: Internal Medicine

## 2018-10-25 MED ORDER — MORPHINE SULFATE ER 30 MG PO TBCR: 30.0000 mg | EXTENDED_RELEASE_TABLET | Freq: Two times a day (BID) | ORAL | 0 refills | Status: DC

## 2018-10-25 NOTE — Telephone Encounter (Signed)
Copied from [place] [id number]. Topic: Quick Communication - Rx Refill/Question >> Oct 25, 2018  8:18 AM Parke Poisson wrote: Medication: oxyCODONE (ROXICODONE) 15 MG immediate release tablet 2)morphine (MS CONTIN) 30 MG 12 hr tablet   Has the patient contacted their pharmacy? Yes  (Agent: If yes, when and what did the pharmacy advise?) No response from office  Preferred Pharmacy (with phone number or street name): [place], [place] - [address] [phone] (Phone) [phone] (Fax)    Agent: Please be advised that RX refills may take up to 3 business days. We ask that you follow-up with your pharmacy. >> Oct 25, 2018  8:40 AM Rainey Pines A wrote: Patient would like a callback from [facility] today in regards to prescriptions being rewritten

## 2018-10-25 NOTE — Telephone Encounter (Signed)
Pt informed of below. OV scheduled.

## 2018-10-25 NOTE — Telephone Encounter (Signed)
Ok RTC 1 mo Thx

## 2018-10-25 NOTE — Telephone Encounter (Signed)
Dr. Alain Marion,  I saw his drug screen results.Were you going to refill his MS Contin?

## 2018-11-22 ENCOUNTER — Other Ambulatory Visit: Payer: PPO

## 2018-11-22 ENCOUNTER — Encounter: Payer: Self-pay | Admitting: Internal Medicine

## 2018-11-22 ENCOUNTER — Ambulatory Visit (INDEPENDENT_AMBULATORY_CARE_PROVIDER_SITE_OTHER): Payer: PPO | Admitting: Internal Medicine

## 2018-11-22 ENCOUNTER — Other Ambulatory Visit: Payer: Self-pay

## 2018-11-22 VITALS — BP 130/80 | HR 48 | Temp 98.7°F | Ht 66.0 in | Wt 222.0 lb

## 2018-11-22 DIAGNOSIS — E291 Testicular hypofunction: Secondary | ICD-10-CM

## 2018-11-22 DIAGNOSIS — Z23 Encounter for immunization: Secondary | ICD-10-CM

## 2018-11-22 DIAGNOSIS — E538 Deficiency of other specified B group vitamins: Secondary | ICD-10-CM

## 2018-11-22 DIAGNOSIS — F22 Delusional disorders: Secondary | ICD-10-CM

## 2018-11-22 DIAGNOSIS — F411 Generalized anxiety disorder: Secondary | ICD-10-CM

## 2018-11-22 DIAGNOSIS — M5440 Lumbago with sciatica, unspecified side: Secondary | ICD-10-CM

## 2018-11-22 MED ORDER — MORPHINE SULFATE ER 30 MG PO TBCR: 30.0000 mg | EXTENDED_RELEASE_TABLET | Freq: Two times a day (BID) | ORAL | 0 refills | Status: DC

## 2018-11-22 NOTE — Assessment & Plan Note (Signed)
Doing fair 

## 2018-11-22 NOTE — Assessment & Plan Note (Addendum)
UDS with (-) Oxycodone (+) opiates on 10/19/18. Pt states his son took it (OXY).  Discussed - will maintain on MS contin. D/c Oxy UDS today RTC 1 mo

## 2018-11-22 NOTE — Assessment & Plan Note (Signed)
On Testosterone inj  Potential benefits of a long term testosterone  use as well as potential risks  and complications were explained to the patient and were aknowledged.  

## 2018-11-22 NOTE — Assessment & Plan Note (Signed)
Cont Rx Chronic issues

## 2018-11-22 NOTE — Assessment & Plan Note (Signed)
On B12 

## 2018-11-22 NOTE — Progress Notes (Signed)
Subjective:  Patient ID: Vernon Bishop, male    DOB: June 14, 1940  Age: 78 y.o. MRN: [id number]  CC: No chief complaint on file.   HPI Vernon Bishop presents for LBP, hypogonadism, HTN UDS with (-) Oxycodone (+) opiates on 10/19/18. Pt states his son took it (OXY). C/o hip pain - L  Outpatient Medications Prior to Visit  Medication Sig Dispense Refill  . acetaminophen (TYLENOL) 500 MG tablet Take 2 tablets (1,000 mg total) by mouth every 8 (eight) hours as needed for mild pain. 30 tablet 0  . amLODipine (NORVASC) 10 MG tablet TAKE 1 TABLET BY MOUTH EVERY MORNING. 90 tablet 3  . aspirin EC 81 MG tablet Take 81 mg by mouth daily.    . benazepril-hydrochlorthiazide (LOTENSIN HCT) 20-12.5 MG per tablet TAKE 2 TABLETS BY MOUTH DAILY. 60 tablet 5  . buPROPion (WELLBUTRIN XL) 150 MG 24 hr tablet 3  qam 90 tablet 5  . Cholecalciferol (VITAMIN D3) 1000 UNIT tablet Take 1,000 Units by mouth daily.      . clotrimazole-betamethasone (LOTRISONE) cream APPLY TO AFFECTED AREA 2 TIMES A DAY. 45 g 1  . Cyanocobalamin (VITAMIN B-12 CR) 1000 MCG TBCR Take 1,000 mcg by mouth daily.      Marland Kitchen docusate sodium 100 MG CAPS Take 100 mg by mouth 2 (two) times daily. 10 capsule 0  . dutasteride (AVODART) 0.5 MG capsule TAKE 1 CAPSULE BY MOUTH DAILY. 30 capsule 11  . ferrous sulfate 325 (65 FE) MG tablet Take 1 tablet (325 mg total) by mouth 3 (three) times daily after meals.  3  . FLUoxetine (PROZAC) 40 MG capsule TAKE 1 CAPSULE BY MOUTH DAILY. 90 capsule 3  . gabapentin (NEURONTIN) 300 MG capsule TAKE 1 CAPSULE BY MOUTH THREE TIMES A DAY 90 capsule 11  . ketoconazole (NIZORAL) 2 % shampoo APPLY AS DIRECTED 2 TIMES A WEEK 120 mL 0  . morphine (MS CONTIN) 30 MG 12 hr tablet Take 1 tablet (30 mg total) by mouth every 12 (twelve) hours. 60 tablet 0  . Multiple Vitamin (MULTIVITAMIN) tablet Take 1 tablet by mouth daily.    Marland Kitchen oxyCODONE (ROXICODONE) 15 MG immediate release tablet TAKE 1 TABLET BY MOUTH EVERY 6 HOURS AS  NEEDED FOR PAIN. 120 tablet 0  . polyethylene glycol (MIRALAX / GLYCOLAX) packet Take 17 g by mouth 2 (two) times daily. 14 each 0  . propranolol (INDERAL) 80 MG tablet TAKE 1 TABLET BY MOUTH 3 TIMES DAILY. 270 tablet 3  . testosterone cypionate (DEPOTESTOSTERONE CYPIONATE) 200 MG/ML injection INJECT 3 MILLILITERS INTO THE MUSCLE EVERY 14 DAYS 10 mL 5  . tiZANidine (ZANAFLEX) 4 MG tablet TAKE 1 TABLET BY MOUTH EVERY 8 HOURS AS NEEDED FOR MUSCLE SPASMS. 60 tablet 2  . traZODone (DESYREL) 50 MG tablet TAKE 1-2 TABLETS BY MOUTH AT BEDTIME. 60 tablet 5   No facility-administered medications prior to visit.     ROS: Review of Systems  Constitutional: Positive for fatigue. Negative for appetite change and unexpected weight change.  HENT: Negative for congestion, nosebleeds, sneezing, sore throat and trouble swallowing.   Eyes: Negative for itching and visual disturbance.  Respiratory: Negative for cough.   Cardiovascular: Negative for chest pain, palpitations and leg swelling.  Gastrointestinal: Negative for abdominal distention, blood in stool, diarrhea and nausea.  Genitourinary: Negative for frequency and hematuria.  Musculoskeletal: Positive for arthralgias, back pain and gait problem. Negative for joint swelling and neck pain.  Skin: Negative for rash.  Neurological: Negative  for dizziness, tremors, speech difficulty and weakness.  Psychiatric/Behavioral: Positive for decreased concentration. Negative for agitation, dysphoric mood, sleep disturbance and suicidal ideas. The patient is not nervous/anxious.     Objective:  There were no vitals taken for this visit.  BP Readings from Last 3 Encounters:  09/20/18 (!) 154/72  09/20/18 (!) 154/72  04/07/18 136/72    Wt Readings from Last 3 Encounters:  09/20/18 223 lb (101.2 kg)  09/20/18 223 lb (101.2 kg)  04/07/18 232 lb (105.2 kg)    Physical Exam Constitutional:      General: He is not in acute distress.    Appearance: He is  well-developed.     Comments: NAD  Eyes:     Conjunctiva/sclera: Conjunctivae normal.     Pupils: Pupils are equal, round, and reactive to light.  Neck:     Musculoskeletal: Normal range of motion.     Thyroid: No thyromegaly.     Vascular: No JVD.  Cardiovascular:     Rate and Rhythm: Normal rate and regular rhythm.     Heart sounds: Normal heart sounds. No murmur. No friction rub. No gallop.   Pulmonary:     Effort: Pulmonary effort is normal. No respiratory distress.     Breath sounds: Normal breath sounds. No wheezing or rales.  Chest:     Chest wall: No tenderness.  Abdominal:     General: Bowel sounds are normal. There is no distension.     Palpations: Abdomen is soft. There is no mass.     Tenderness: There is no abdominal tenderness. There is no guarding or rebound.  Musculoskeletal: Normal range of motion.        General: Tenderness present.  Lymphadenopathy:     Cervical: No cervical adenopathy.  Skin:    General: Skin is warm and dry.     Findings: No rash.  Neurological:     Mental Status: He is alert and oriented to person, place, and time.     Cranial Nerves: No cranial nerve deficit.     Motor: No abnormal muscle tone.     Coordination: Coordination normal.     Gait: Gait normal.     Deep Tendon Reflexes: Reflexes are normal and symmetric.  Psychiatric:        Behavior: Behavior normal.        Thought Content: Thought content normal.        Judgment: Judgment normal.   LS  Lab Results  Component Value Date   WBC 5.8 10/19/2018   HGB 16.2 10/19/2018   HCT 48.1 10/19/2018   PLT 190.0 10/19/2018   GLUCOSE 124 (H) 10/19/2018   CHOL 133 10/19/2018   TRIG 74.0 10/19/2018   HDL 36.90 (L) 10/19/2018   LDLCALC 81 10/19/2018   ALT 31 10/19/2018   AST 22 10/19/2018   NA 138 10/19/2018   K 4.1 10/19/2018   CL 104 10/19/2018   CREATININE 0.92 10/19/2018   BUN 18 10/19/2018   CO2 25 10/19/2018   TSH 2.16 10/19/2018   PSA 1.01 10/19/2018   INR 1.00  03/07/2013   HGBA1C 5.1 01/10/2014    Dg Hip Unilat With Pelvis 2-3 Views Left  Result Date: 05/06/2016 CLINICAL DATA:  Fall 1 month ago with persistent left hip pain EXAM: DG HIP (WITH OR WITHOUT PELVIS) 2-3V LEFT COMPARISON:  AP pelvis x-ray dated 14 March 2013 FINDINGS: The bony pelvis is subjectively adequately mineralized. No acute fracture is observed. The observed portions of the sacrum  are grossly normal. 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 On the left the hip joint spaces reasonably well-maintained. There is some scleroses of the inferior aspect of the acetabulum. The femoral head and acetabular articular surfaces remain smooth. The femoral neck, intertrochanteric, and subtrochanteric regions are normal. IMPRESSION: There is mild degenerative change of the left hip without significant joint space loss. There is no acute or healing fracture or dislocation. Electronically Signed   By: David  Martinique M.D.   On: 05/06/2016 15:05    Assessment & Plan:   There are no diagnoses linked to this encounter.   No orders of the defined types were placed in this encounter.    Follow-up: No follow-ups on file.  Walker Kehr, MD

## 2018-11-23 ENCOUNTER — Other Ambulatory Visit: Payer: Self-pay | Admitting: Internal Medicine

## 2018-11-23 LAB — PMP SCREEN PROFILE (10S), URINE
Amphetamine Scrn, Ur: NEGATIVE ng/mL
BARBITURATE SCREEN URINE: NEGATIVE ng/mL
BENZODIAZEPINE SCREEN, URINE: NEGATIVE ng/mL
CANNABINOIDS UR QL SCN: NEGATIVE ng/mL
Cocaine (Metab) Scrn, Ur: NEGATIVE ng/mL
Creatinine(Crt), U: 56.9 mg/dL (ref 20.0–300.0)
Methadone Screen, Urine: NEGATIVE ng/mL
OXYCODONE+OXYMORPHONE UR QL SCN: NEGATIVE ng/mL
Opiate Scrn, Ur: POSITIVE ng/mL — AB
Ph of Urine: 6.5 (ref 4.5–8.9)
Phencyclidine Qn, Ur: NEGATIVE ng/mL
Propoxyphene Scrn, Ur: NEGATIVE ng/mL

## 2018-12-21 ENCOUNTER — Other Ambulatory Visit: Payer: Self-pay | Admitting: Internal Medicine

## 2018-12-22 ENCOUNTER — Other Ambulatory Visit: Payer: Self-pay

## 2018-12-22 ENCOUNTER — Ambulatory Visit (INDEPENDENT_AMBULATORY_CARE_PROVIDER_SITE_OTHER): Payer: PPO | Admitting: Internal Medicine

## 2018-12-22 ENCOUNTER — Encounter: Payer: Self-pay | Admitting: Internal Medicine

## 2018-12-22 DIAGNOSIS — I10 Essential (primary) hypertension: Secondary | ICD-10-CM

## 2018-12-22 DIAGNOSIS — E663 Overweight: Secondary | ICD-10-CM | POA: Diagnosis not present

## 2018-12-22 DIAGNOSIS — M5440 Lumbago with sciatica, unspecified side: Secondary | ICD-10-CM | POA: Diagnosis not present

## 2018-12-22 DIAGNOSIS — E538 Deficiency of other specified B group vitamins: Secondary | ICD-10-CM | POA: Diagnosis not present

## 2018-12-22 DIAGNOSIS — E119 Type 2 diabetes mellitus without complications: Secondary | ICD-10-CM

## 2018-12-22 MED ORDER — CICLOPIROX 8 % EX SOLN: Freq: Every day | CUTANEOUS | 1 refills | Status: DC

## 2018-12-22 MED ORDER — MORPHINE SULFATE ER 30 MG PO TBCR: 30.0000 mg | EXTENDED_RELEASE_TABLET | Freq: Two times a day (BID) | ORAL | 0 refills | Status: DC

## 2018-12-22 NOTE — Assessment & Plan Note (Signed)
Rx: will maintain on MS contin.   Potential benefits of a long term opioids use as well as potential risks (i.e. addiction risk, apnea etc) and complications (i.e. Somnolence, constipation and others) were explained to the patient and were aknowledged.

## 2018-12-22 NOTE — Progress Notes (Signed)
Subjective:  Patient ID: Vernon Bishop, male    DOB: 1941/03/25  Age: 78 y.o. MRN: [id number]  CC: No chief complaint on file.   HPI Vernon Bishop presents for LBP, HTN, depression f/u Lost wt - quit junk food, sodas  Outpatient Medications Prior to Visit  Medication Sig Dispense Refill  . acetaminophen (TYLENOL) 500 MG tablet Take 2 tablets (1,000 mg total) by mouth every 8 (eight) hours as needed for mild pain. 30 tablet 0  . amLODipine (NORVASC) 10 MG tablet TAKE 1 TABLET BY MOUTH EVERY MORNING. 90 tablet 3  . aspirin EC 81 MG tablet Take 81 mg by mouth daily.    . benazepril-hydrochlorthiazide (LOTENSIN HCT) 20-12.5 MG per tablet TAKE 2 TABLETS BY MOUTH DAILY. 60 tablet 5  . buPROPion (WELLBUTRIN XL) 150 MG 24 hr tablet TAKE 3 TABLETS BY MOUTH EVERY MORNING 90 tablet 3  . Cholecalciferol (VITAMIN D3) 1000 UNIT tablet Take 1,000 Units by mouth daily.      . clotrimazole-betamethasone (LOTRISONE) cream APPLY TO AFFECTED AREA 2 TIMES A DAY. 45 g 1  . Cyanocobalamin (VITAMIN B-12 CR) 1000 MCG TBCR Take 1,000 mcg by mouth daily.      Marland Kitchen docusate sodium 100 MG CAPS Take 100 mg by mouth 2 (two) times daily. 10 capsule 0  . dutasteride (AVODART) 0.5 MG capsule TAKE 1 CAPSULE BY MOUTH DAILY. 30 capsule 11  . ferrous sulfate 325 (65 FE) MG tablet Take 1 tablet (325 mg total) by mouth 3 (three) times daily after meals.  3  . FLUoxetine (PROZAC) 40 MG capsule TAKE 1 CAPSULE BY MOUTH DAILY. 90 capsule 3  . gabapentin (NEURONTIN) 300 MG capsule TAKE 1 CAPSULE BY MOUTH THREE TIMES A DAY 90 capsule 11  . ketoconazole (NIZORAL) 2 % shampoo APPLY AS DIRECTED 2 TIMES A WEEK 120 mL 0  . morphine (MS CONTIN) 30 MG 12 hr tablet Take 1 tablet (30 mg total) by mouth every 12 (twelve) hours. 60 tablet 0  . Multiple Vitamin (MULTIVITAMIN) tablet Take 1 tablet by mouth daily.    . polyethylene glycol (MIRALAX / GLYCOLAX) packet Take 17 g by mouth 2 (two) times daily. 14 each 0  . propranolol (INDERAL) 80  MG tablet TAKE 1 TABLET BY MOUTH 3 TIMES DAILY. 270 tablet 3  . testosterone cypionate (DEPOTESTOSTERONE CYPIONATE) 200 MG/ML injection INJECT 3 MILLILITERS INTO THE MUSCLE EVERY 14 DAYS 10 mL 5  . tiZANidine (ZANAFLEX) 4 MG tablet TAKE 1 TABLET BY MOUTH EVERY 8 HOURS AS NEEDED FOR MUSCLE SPASMS. 60 tablet 2  . traZODone (DESYREL) 50 MG tablet TAKE 1-2 TABLETS BY MOUTH AT BEDTIME. 60 tablet 5   No facility-administered medications prior to visit.     ROS: Review of Systems  Constitutional: Positive for fatigue. Negative for appetite change and unexpected weight change.  HENT: Negative for congestion, nosebleeds, sneezing, sore throat and trouble swallowing.   Eyes: Negative for itching and visual disturbance.  Respiratory: Negative for cough.   Cardiovascular: Negative for chest pain, palpitations and leg swelling.  Gastrointestinal: Negative for abdominal distention, blood in stool, diarrhea and nausea.  Genitourinary: Negative for frequency and hematuria.  Musculoskeletal: Positive for arthralgias, back pain and gait problem. Negative for joint swelling and neck pain.  Skin: Negative for rash.  Neurological: Negative for dizziness, tremors, speech difficulty and weakness.  Psychiatric/Behavioral: Negative for agitation, dysphoric mood, sleep disturbance and suicidal ideas. The patient is not nervous/anxious.     Objective:  BP Marland Kitchen)  144/66 (BP Location: Left Arm, Patient Position: Sitting, Cuff Size: Large)   Pulse (!) 52   Temp 98.4 F (36.9 C) (Oral)   Ht 5\' 6"  (1.676 m)   Wt 216 lb (98 kg)   SpO2 96%   BMI 34.86 kg/m   BP Readings from Last 3 Encounters:  12/22/18 (!) 144/66  11/22/18 130/80  09/20/18 (!) 154/72    Wt Readings from Last 3 Encounters:  12/22/18 216 lb (98 kg)  11/22/18 222 lb (100.7 kg)  09/20/18 223 lb (101.2 kg)    Physical Exam Constitutional:      General: He is not in acute distress.    Appearance: He is well-developed.     Comments: NAD   Eyes:     Conjunctiva/sclera: Conjunctivae normal.     Pupils: Pupils are equal, round, and reactive to light.  Neck:     Musculoskeletal: Normal range of motion.     Thyroid: No thyromegaly.     Vascular: No JVD.  Cardiovascular:     Rate and Rhythm: Normal rate and regular rhythm.     Heart sounds: Normal heart sounds. No murmur. No friction rub. No gallop.   Pulmonary:     Effort: Pulmonary effort is normal. No respiratory distress.     Breath sounds: Normal breath sounds. No wheezing or rales.  Chest:     Chest wall: No tenderness.  Abdominal:     General: Bowel sounds are normal. There is no distension.     Palpations: Abdomen is soft. There is no mass.     Tenderness: There is no abdominal tenderness. There is no guarding or rebound.  Musculoskeletal: Normal range of motion.        General: Tenderness present.  Lymphadenopathy:     Cervical: No cervical adenopathy.  Skin:    General: Skin is warm and dry.     Findings: No rash.  Neurological:     Mental Status: He is alert and oriented to person, place, and time.     Cranial Nerves: No cranial nerve deficit.     Motor: No abnormal muscle tone.     Coordination: Coordination normal.     Gait: Gait normal.     Deep Tendon Reflexes: Reflexes are normal and symmetric.  Psychiatric:        Behavior: Behavior normal.        Thought Content: Thought content normal.        Judgment: Judgment normal.   LS w/pain Onycho  Lab Results  Component Value Date   WBC 5.8 10/19/2018   HGB 16.2 10/19/2018   HCT 48.1 10/19/2018   PLT 190.0 10/19/2018   GLUCOSE 124 (H) 10/19/2018   CHOL 133 10/19/2018   TRIG 74.0 10/19/2018   HDL 36.90 (L) 10/19/2018   LDLCALC 81 10/19/2018   ALT 31 10/19/2018   AST 22 10/19/2018   NA 138 10/19/2018   K 4.1 10/19/2018   CL 104 10/19/2018   CREATININE 0.92 10/19/2018   BUN 18 10/19/2018   CO2 25 10/19/2018   TSH 2.16 10/19/2018   PSA 1.01 10/19/2018   INR 1.00 03/07/2013   HGBA1C 5.1  01/10/2014    Dg Hip Unilat With Pelvis 2-3 Views Left  Result Date: 05/06/2016 CLINICAL DATA:  Fall 1 month ago with persistent left hip pain EXAM: DG HIP (WITH OR WITHOUT PELVIS) 2-3V LEFT COMPARISON:  AP pelvis x-ray dated 14 March 2013 FINDINGS: The bony pelvis is subjectively adequately mineralized. No acute fracture  is observed. The observed portions of the sacrum are grossly normal. 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 On the left the hip joint spaces reasonably well-maintained. There is some scleroses of the inferior aspect of the acetabulum. The femoral head and acetabular articular surfaces remain smooth. The femoral neck, intertrochanteric, and subtrochanteric regions are normal. IMPRESSION: There is mild degenerative change of the left hip without significant joint space loss. There is no acute or healing fracture or dislocation. Electronically Signed   By: David  Martinique M.D.   On: 05/06/2016 15:05    Assessment & Plan:   There are no diagnoses linked to this encounter.   No orders of the defined types were placed in this encounter.    Follow-up: No follow-ups on file.  Walker Kehr, MD

## 2018-12-22 NOTE — Assessment & Plan Note (Signed)
Diet discussed Better

## 2018-12-22 NOTE — Patient Instructions (Signed)

## 2018-12-22 NOTE — Assessment & Plan Note (Signed)
Lotensin HCT, Amlodipine, Inderal 

## 2018-12-22 NOTE — Assessment & Plan Note (Signed)
Cont w/wt loss 

## 2018-12-22 NOTE — Assessment & Plan Note (Signed)
On B12 

## 2018-12-29 ENCOUNTER — Other Ambulatory Visit: Payer: Self-pay | Admitting: Internal Medicine

## 2019-02-01 ENCOUNTER — Encounter: Payer: Self-pay | Admitting: Internal Medicine

## 2019-02-01 ENCOUNTER — Other Ambulatory Visit: Payer: Self-pay

## 2019-02-01 ENCOUNTER — Ambulatory Visit (INDEPENDENT_AMBULATORY_CARE_PROVIDER_SITE_OTHER)
Admission: RE | Admit: 2019-02-01 | Discharge: 2019-02-01 | Disposition: A | Discharge status: Home / Self Care | Payer: PPO | Source: Ambulatory Visit | Attending: Internal Medicine | Admitting: Internal Medicine

## 2019-02-01 ENCOUNTER — Ambulatory Visit (INDEPENDENT_AMBULATORY_CARE_PROVIDER_SITE_OTHER): Payer: PPO | Admitting: Internal Medicine

## 2019-02-01 DIAGNOSIS — E538 Deficiency of other specified B group vitamins: Secondary | ICD-10-CM | POA: Diagnosis not present

## 2019-02-01 DIAGNOSIS — M5440 Lumbago with sciatica, unspecified side: Secondary | ICD-10-CM

## 2019-02-01 DIAGNOSIS — E119 Type 2 diabetes mellitus without complications: Secondary | ICD-10-CM

## 2019-02-01 DIAGNOSIS — R29898 Other symptoms and signs involving the musculoskeletal system: Secondary | ICD-10-CM | POA: Diagnosis not present

## 2019-02-01 DIAGNOSIS — M545 Low back pain: Secondary | ICD-10-CM | POA: Diagnosis not present

## 2019-02-01 MED ORDER — TIZANIDINE HCL 4 MG PO TABS: ORAL_TABLET | ORAL | 1 refills | Status: DC

## 2019-02-01 MED ORDER — METHYLPREDNISOLONE ACETATE 80 MG/ML IJ SUSP
80.0000 mg | Freq: Once | INTRAMUSCULAR | Status: AC
  Administered 2019-02-01: 80 mg via INTRAMUSCULAR

## 2019-02-01 NOTE — Assessment & Plan Note (Signed)
Vit B12 

## 2019-02-01 NOTE — Assessment & Plan Note (Signed)
R/o compr fx etc Depo-medrol IM Tizanidine X ray MRI if needed RTC 2 wks

## 2019-02-01 NOTE — Progress Notes (Signed)
Subjective:  Patient ID: Vernon Bishop, male    DOB: [birth year]  Age: 78 y.o. MRN: [id number]  CC: No chief complaint on file.   HPI Vernon Bishop presents for LBP and B leg weakness after he got ice cream out of the freezer. He is using a walker now F/u HTN, insomnia    Outpatient Medications Prior to Visit  Medication Sig Dispense Refill  . acetaminophen (TYLENOL) 500 MG tablet Take 2 tablets (1,000 mg total) by mouth every 8 (eight) hours as needed for mild pain. 30 tablet 0  . amLODipine (NORVASC) 10 MG tablet TAKE 1 TABLET BY MOUTH EVERY MORNING. 90 tablet 3  . aspirin EC 81 MG tablet Take 81 mg by mouth daily.    . benazepril-hydrochlorthiazide (LOTENSIN HCT) 20-12.5 MG per tablet TAKE 2 TABLETS BY MOUTH DAILY. 60 tablet 5  . buPROPion (WELLBUTRIN XL) 150 MG 24 hr tablet TAKE 3 TABLETS BY MOUTH EVERY MORNING 90 tablet 3  . Cholecalciferol (VITAMIN D3) 1000 UNIT tablet Take 1,000 Units by mouth daily.      . ciclopirox (PENLAC) 8 % solution Apply topically at bedtime. Apply over nail and surrounding skin. Apply daily over previous coat. After seven (7) days, may remove with alcohol and continue cycle. 6.6 mL 1  . clotrimazole-betamethasone (LOTRISONE) cream APPLY TO AFFECTED AREA 2 TIMES A DAY. 45 g 1  . Cyanocobalamin (VITAMIN B-12 CR) 1000 MCG TBCR Take 1,000 mcg by mouth daily.      Marland Kitchen docusate sodium 100 MG CAPS Take 100 mg by mouth 2 (two) times daily. 10 capsule 0  . dutasteride (AVODART) 0.5 MG capsule TAKE 1 CAPSULE BY MOUTH DAILY. 30 capsule 11  . ferrous sulfate 325 (65 FE) MG tablet Take 1 tablet (325 mg total) by mouth 3 (three) times daily after meals.  3  . FLUoxetine (PROZAC) 40 MG capsule TAKE 1 CAPSULE BY MOUTH DAILY. 90 capsule 3  . gabapentin (NEURONTIN) 300 MG capsule TAKE 1 CAPSULE BY MOUTH THREE TIMES A DAY 90 capsule 11  . ketoconazole (NIZORAL) 2 % shampoo APPLY AS DIRECTED 2 TIMES A WEEK 120 mL 0  . morphine (MS CONTIN) 30 MG 12 hr tablet Take 1 tablet  (30 mg total) by mouth every 12 (twelve) hours. 60 tablet 0  . morphine (MS CONTIN) 30 MG 12 hr tablet Take 1 tablet (30 mg total) by mouth every 12 (twelve) hours. 60 tablet 0  . Multiple Vitamin (MULTIVITAMIN) tablet Take 1 tablet by mouth daily.    . polyethylene glycol (MIRALAX / GLYCOLAX) packet Take 17 g by mouth 2 (two) times daily. 14 each 0  . propranolol (INDERAL) 80 MG tablet TAKE 1 TABLET BY MOUTH 3 TIMES DAILY. 270 tablet 3  . testosterone cypionate (DEPOTESTOSTERONE CYPIONATE) 200 MG/ML injection INJECT 3 MILLILITERS INTO THE MUSCLE EVERY 14 DAYS 10 mL 5  . tiZANidine (ZANAFLEX) 4 MG tablet TAKE 1 TABLET BY MOUTH EVERY 8 HOURS AS NEEDED FOR MUSCLE SPASMS. 60 tablet 2  . traZODone (DESYREL) 50 MG tablet TAKE 1 TO 2 TABLETS BY MOUTH AT BEDTIME. 60 tablet 5   No facility-administered medications prior to visit.     ROS: Review of Systems  Constitutional: Positive for fatigue. Negative for appetite change and unexpected weight change.  HENT: Negative for congestion, nosebleeds, sneezing, sore throat and trouble swallowing.   Eyes: Negative for itching and visual disturbance.  Respiratory: Negative for cough.   Cardiovascular: Negative for chest pain, palpitations and leg  swelling.  Gastrointestinal: Negative for abdominal distention, blood in stool, diarrhea and nausea.  Genitourinary: Negative for frequency and hematuria.  Musculoskeletal: Positive for arthralgias, back pain and gait problem. Negative for joint swelling and neck pain.  Skin: Negative for rash.  Neurological: Positive for weakness. Negative for dizziness, tremors and speech difficulty.  Psychiatric/Behavioral: Positive for decreased concentration and dysphoric mood. Negative for agitation, sleep disturbance and suicidal ideas. The patient is nervous/anxious.   spasms in the R leg  Objective:  BP (!) 180/78 (BP Location: Left Arm, Patient Position: Sitting, Cuff Size: Large)   Pulse (!) 57   Temp 98.4 F (36.9  C) (Oral)   Ht 5\' 6"  (1.676 m)   Wt 210 lb (95.3 kg)   SpO2 97%   BMI 33.89 kg/m   BP Readings from Last 3 Encounters:  02/01/19 (!) 180/78  12/22/18 (!) 144/66  11/22/18 130/80    Wt Readings from Last 3 Encounters:  02/01/19 210 lb (95.3 kg)  12/22/18 216 lb (98 kg)  11/22/18 222 lb (100.7 kg)    Physical Exam Constitutional:      General: He is not in acute distress.    Appearance: He is well-developed. He is obese.     Comments: NAD  Eyes:     Conjunctiva/sclera: Conjunctivae normal.     Pupils: Pupils are equal, round, and reactive to light.  Neck:     Musculoskeletal: Normal range of motion.     Thyroid: No thyromegaly.     Vascular: No JVD.  Cardiovascular:     Rate and Rhythm: Normal rate and regular rhythm.     Heart sounds: Normal heart sounds. No murmur. No friction rub. No gallop.   Pulmonary:     Effort: Pulmonary effort is normal. No respiratory distress.     Breath sounds: Normal breath sounds. No wheezing or rales.  Chest:     Chest wall: No tenderness.  Abdominal:     General: Bowel sounds are normal. There is no distension.     Palpations: Abdomen is soft. There is no mass.     Tenderness: There is no abdominal tenderness. There is no guarding or rebound.  Musculoskeletal: Normal range of motion.        General: Tenderness present.  Lymphadenopathy:     Cervical: No cervical adenopathy.  Skin:    General: Skin is warm and dry.     Findings: No rash.  Neurological:     Mental Status: He is alert and oriented to person, place, and time.     Cranial Nerves: No cranial nerve deficit.     Motor: Weakness present. No abnormal muscle tone.     Coordination: Coordination abnormal.     Gait: Gait abnormal.     Deep Tendon Reflexes: Reflexes are normal and symmetric.  Psychiatric:        Behavior: Behavior normal.        Thought Content: Thought content normal.        Judgment: Judgment normal.   LS - pain walker  Lab Results  Component Value  Date   WBC 5.8 10/19/2018   HGB 16.2 10/19/2018   HCT 48.1 10/19/2018   PLT 190.0 10/19/2018   GLUCOSE 124 (H) 10/19/2018   CHOL 133 10/19/2018   TRIG 74.0 10/19/2018   HDL 36.90 (L) 10/19/2018   LDLCALC 81 10/19/2018   ALT 31 10/19/2018   AST 22 10/19/2018   NA 138 10/19/2018   K 4.1 10/19/2018   CL  104 10/19/2018   CREATININE 0.92 10/19/2018   BUN 18 10/19/2018   CO2 25 10/19/2018   TSH 2.16 10/19/2018   PSA 1.01 10/19/2018   INR 1.00 03/07/2013   HGBA1C 5.1 01/10/2014    Dg Hip Unilat With Pelvis 2-3 Views Left  Result Date: 05/06/2016 CLINICAL DATA:  Fall 1 month ago with persistent left hip pain EXAM: DG HIP (WITH OR WITHOUT PELVIS) 2-3V LEFT COMPARISON:  AP pelvis x-ray dated 14 March 2013 FINDINGS: The bony pelvis is subjectively adequately mineralized. No acute fracture is observed. The observed portions of the sacrum are grossly normal. 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 On the left the hip joint spaces reasonably well-maintained. There is some scleroses of the inferior aspect of the acetabulum. The femoral head and acetabular articular surfaces remain smooth. The femoral neck, intertrochanteric, and subtrochanteric regions are normal. IMPRESSION: There is mild degenerative change of the left hip without significant joint space loss. There is no acute or healing fracture or dislocation. Electronically Signed   By: David  Martinique M.D.   On: 05/06/2016 15:05    Assessment & Plan:   There are no diagnoses linked to this encounter.   No orders of the defined types were placed in this encounter.    Follow-up: No follow-ups on file.  Walker Kehr, MD

## 2019-02-01 NOTE — Addendum Note (Signed)
Addended by: Cresenciano Lick on: 02/01/2019 03:08 PM   Modules accepted: Orders

## 2019-02-01 NOTE — Assessment & Plan Note (Signed)
Worse R/o compr fx etc Depo-medrol IM Tizanidine X ray MRI if needed

## 2019-02-01 NOTE — Assessment & Plan Note (Signed)
  On diet  

## 2019-02-21 ENCOUNTER — Ambulatory Visit (INDEPENDENT_AMBULATORY_CARE_PROVIDER_SITE_OTHER): Payer: PPO | Admitting: Internal Medicine

## 2019-02-21 ENCOUNTER — Other Ambulatory Visit: Payer: Self-pay

## 2019-02-21 ENCOUNTER — Encounter: Payer: Self-pay | Admitting: Internal Medicine

## 2019-02-21 DIAGNOSIS — R29898 Other symptoms and signs involving the musculoskeletal system: Secondary | ICD-10-CM

## 2019-02-21 DIAGNOSIS — F339 Major depressive disorder, recurrent, unspecified: Secondary | ICD-10-CM | POA: Diagnosis not present

## 2019-02-21 DIAGNOSIS — M5440 Lumbago with sciatica, unspecified side: Secondary | ICD-10-CM

## 2019-02-21 DIAGNOSIS — I480 Paroxysmal atrial fibrillation: Secondary | ICD-10-CM

## 2019-02-21 DIAGNOSIS — I10 Essential (primary) hypertension: Secondary | ICD-10-CM

## 2019-02-21 MED ORDER — MORPHINE SULFATE ER 30 MG PO TBCR: 30.0000 mg | EXTENDED_RELEASE_TABLET | Freq: Two times a day (BID) | ORAL | 0 refills | Status: DC

## 2019-02-21 MED ORDER — FLUOXETINE HCL 40 MG PO CAPS: 40.0000 mg | ORAL_CAPSULE | Freq: Every day | ORAL | 3 refills | Status: DC

## 2019-02-21 MED ORDER — BUPROPION HCL ER (XL) 150 MG PO TB24: ORAL_TABLET | ORAL | 3 refills | Status: DC

## 2019-02-21 NOTE — Assessment & Plan Note (Signed)
On Fluoxetine, Wellbutrin XL 450 mg/d

## 2019-02-21 NOTE — Progress Notes (Signed)
Subjective:  Patient ID: Vernon Bishop, male    DOB: [birth year]  Age: 78 y.o. MRN: [id number]  CC: No chief complaint on file.   HPI Vernon Bishop presents for chronic pain, HTN, depression f/u Lost wt on a diet   Outpatient Medications Prior to Visit  Medication Sig Dispense Refill  . acetaminophen (TYLENOL) 500 MG tablet Take 2 tablets (1,000 mg total) by mouth every 8 (eight) hours as needed for mild pain. 30 tablet 0  . amLODipine (NORVASC) 10 MG tablet TAKE 1 TABLET BY MOUTH EVERY MORNING. 90 tablet 3  . aspirin EC 81 MG tablet Take 81 mg by mouth daily.    . benazepril-hydrochlorthiazide (LOTENSIN HCT) 20-12.5 MG per tablet TAKE 2 TABLETS BY MOUTH DAILY. 60 tablet 5  . buPROPion (WELLBUTRIN XL) 150 MG 24 hr tablet TAKE 3 TABLETS BY MOUTH EVERY MORNING 90 tablet 3  . Cholecalciferol (VITAMIN D3) 1000 UNIT tablet Take 1,000 Units by mouth daily.      . ciclopirox (PENLAC) 8 % solution Apply topically at bedtime. Apply over nail and surrounding skin. Apply daily over previous coat. After seven (7) days, may remove with alcohol and continue cycle. 6.6 mL 1  . clotrimazole-betamethasone (LOTRISONE) cream APPLY TO AFFECTED AREA 2 TIMES A DAY. 45 g 1  . Cyanocobalamin (VITAMIN B-12 CR) 1000 MCG TBCR Take 1,000 mcg by mouth daily.      Marland Kitchen docusate sodium 100 MG CAPS Take 100 mg by mouth 2 (two) times daily. 10 capsule 0  . dutasteride (AVODART) 0.5 MG capsule TAKE 1 CAPSULE BY MOUTH DAILY. 30 capsule 11  . ferrous sulfate 325 (65 FE) MG tablet Take 1 tablet (325 mg total) by mouth 3 (three) times daily after meals.  3  . FLUoxetine (PROZAC) 40 MG capsule TAKE 1 CAPSULE BY MOUTH DAILY. 90 capsule 3  . gabapentin (NEURONTIN) 300 MG capsule TAKE 1 CAPSULE BY MOUTH THREE TIMES A DAY 90 capsule 11  . ketoconazole (NIZORAL) 2 % shampoo APPLY AS DIRECTED 2 TIMES A WEEK 120 mL 0  . morphine (MS CONTIN) 30 MG 12 hr tablet Take 1 tablet (30 mg total) by mouth every 12 (twelve) hours. 60 tablet 0   . morphine (MS CONTIN) 30 MG 12 hr tablet Take 1 tablet (30 mg total) by mouth every 12 (twelve) hours. 60 tablet 0  . Multiple Vitamin (MULTIVITAMIN) tablet Take 1 tablet by mouth daily.    . polyethylene glycol (MIRALAX / GLYCOLAX) packet Take 17 g by mouth 2 (two) times daily. 14 each 0  . propranolol (INDERAL) 80 MG tablet TAKE 1 TABLET BY MOUTH 3 TIMES DAILY. 270 tablet 3  . testosterone cypionate (DEPOTESTOSTERONE CYPIONATE) 200 MG/ML injection INJECT 3 MILLILITERS INTO THE MUSCLE EVERY 14 DAYS 10 mL 5  . tiZANidine (ZANAFLEX) 4 MG tablet TAKE 1 TABLET BY MOUTH EVERY 8 HOURS AS NEEDED FOR MUSCLE SPASMS. 60 tablet 1  . traZODone (DESYREL) 50 MG tablet TAKE 1 TO 2 TABLETS BY MOUTH AT BEDTIME. 60 tablet 5   No facility-administered medications prior to visit.     ROS: Review of Systems  Constitutional: Negative for appetite change, fatigue and unexpected weight change.  HENT: Negative for congestion, nosebleeds, sneezing, sore throat and trouble swallowing.   Eyes: Negative for itching and visual disturbance.  Respiratory: Negative for cough.   Cardiovascular: Negative for chest pain, palpitations and leg swelling.  Gastrointestinal: Negative for abdominal distention, blood in stool, diarrhea and nausea.  Genitourinary: Negative  for frequency and hematuria.  Musculoskeletal: Positive for arthralgias, back pain and gait problem. Negative for joint swelling and neck pain.  Skin: Negative for rash.  Neurological: Negative for dizziness, tremors, speech difficulty and weakness.  Psychiatric/Behavioral: Positive for decreased concentration and dysphoric mood. Negative for agitation, sleep disturbance and suicidal ideas. The patient is nervous/anxious.     Objective:  BP 130/70 (BP Location: Left Arm, Patient Position: Sitting, Cuff Size: Large)   Pulse 60   Temp 98.2 F (36.8 C) (Oral)   Ht 5\' 6"  (1.676 m)   Wt 210 lb (95.3 kg)   SpO2 97%   BMI 33.89 kg/m   BP Readings from Last 3  Encounters:  02/21/19 130/70  02/01/19 (!) 180/78  12/22/18 (!) 144/66    Wt Readings from Last 3 Encounters:  02/21/19 210 lb (95.3 kg)  02/01/19 210 lb (95.3 kg)  12/22/18 216 lb (98 kg)    Physical Exam Constitutional:      General: He is not in acute distress.    Appearance: He is well-developed. He is obese.     Comments: NAD  Eyes:     Conjunctiva/sclera: Conjunctivae normal.     Pupils: Pupils are equal, round, and reactive to light.  Neck:     Musculoskeletal: Normal range of motion.     Thyroid: No thyromegaly.     Vascular: No JVD.  Cardiovascular:     Rate and Rhythm: Normal rate and regular rhythm.     Heart sounds: Normal heart sounds. No murmur. No friction rub. No gallop.   Pulmonary:     Effort: Pulmonary effort is normal. No respiratory distress.     Breath sounds: Normal breath sounds. No wheezing or rales.  Chest:     Chest wall: No tenderness.  Abdominal:     General: Bowel sounds are normal. There is no distension.     Palpations: Abdomen is soft. There is no mass.     Tenderness: There is no abdominal tenderness. There is no guarding or rebound.  Musculoskeletal: Normal range of motion.        General: Tenderness present.  Lymphadenopathy:     Cervical: No cervical adenopathy.  Skin:    General: Skin is warm and dry.     Findings: No rash.  Neurological:     Mental Status: He is alert and oriented to person, place, and time.     Cranial Nerves: No cranial nerve deficit.     Motor: No abnormal muscle tone.     Coordination: Coordination abnormal.     Gait: Gait abnormal.     Deep Tendon Reflexes: Reflexes are normal and symmetric.  Psychiatric:        Behavior: Behavior normal.        Thought Content: Thought content normal.        Judgment: Judgment normal.    Walker LS w/pain  Lab Results  Component Value Date   WBC 5.8 10/19/2018   HGB 16.2 10/19/2018   HCT 48.1 10/19/2018   PLT 190.0 10/19/2018   GLUCOSE 124 (H) 10/19/2018    CHOL 133 10/19/2018   TRIG 74.0 10/19/2018   HDL 36.90 (L) 10/19/2018   LDLCALC 81 10/19/2018   ALT 31 10/19/2018   AST 22 10/19/2018   NA 138 10/19/2018   K 4.1 10/19/2018   CL 104 10/19/2018   CREATININE 0.92 10/19/2018   BUN 18 10/19/2018   CO2 25 10/19/2018   TSH 2.16 10/19/2018   PSA 1.01  10/19/2018   INR 1.00 03/07/2013   HGBA1C 5.1 01/10/2014    Dg Lumbar Spine 2-3 Views  Result Date: 02/02/2019 CLINICAL DATA:  Severe low back pain and right-sided leg pain for 4 days. EXAM: LUMBAR SPINE - 2-3 VIEW COMPARISON:  None FINDINGS: Curvature of the lumbar spine is convex towards the left. Anterolisthesis of L4 on L5 is identified. Multi level disc space narrowing and large ventral endplate spurring noted. The vertebral body heights are well preserved. No fractures identified. Previous right hip arthroplasty noted. IMPRESSION: Mild scoliosis and moderate multi level degenerative disc disease. Electronically Signed   By: Kerby Moors M.D.   On: 02/02/2019 08:33    Assessment & Plan:   There are no diagnoses linked to this encounter.   No orders of the defined types were placed in this encounter.    Follow-up: No follow-ups on file.  Walker Kehr, MD

## 2019-02-21 NOTE — Assessment & Plan Note (Signed)
MS-contin, Tizanidine Chronic Potential benefits of a long term opioids use as well as potential risks (i.e. addiction risk, apnea etc) and complications (i.e. Somnolence, constipation and others) were explained to the patient and were aknowledged.

## 2019-02-21 NOTE — Assessment & Plan Note (Signed)
Vernon Bishop

## 2019-02-21 NOTE — Assessment & Plan Note (Signed)
BP Readings from Last 3 Encounters:  02/21/19 130/70  02/01/19 (!) 180/78  12/22/18 (!) 144/66

## 2019-02-21 NOTE — Assessment & Plan Note (Signed)
On ASA 

## 2019-04-05 ENCOUNTER — Other Ambulatory Visit: Payer: Self-pay

## 2019-04-05 ENCOUNTER — Ambulatory Visit (INDEPENDENT_AMBULATORY_CARE_PROVIDER_SITE_OTHER): Payer: PPO | Admitting: Internal Medicine

## 2019-04-05 ENCOUNTER — Encounter: Payer: Self-pay | Admitting: Internal Medicine

## 2019-04-05 VITALS — BP 132/60 | HR 56 | Temp 98.9°F | Ht 66.0 in | Wt 205.0 lb

## 2019-04-05 DIAGNOSIS — M25552 Pain in left hip: Secondary | ICD-10-CM

## 2019-04-05 DIAGNOSIS — H9201 Otalgia, right ear: Secondary | ICD-10-CM | POA: Diagnosis not present

## 2019-04-05 DIAGNOSIS — R29898 Other symptoms and signs involving the musculoskeletal system: Secondary | ICD-10-CM

## 2019-04-05 DIAGNOSIS — E559 Vitamin D deficiency, unspecified: Secondary | ICD-10-CM | POA: Diagnosis not present

## 2019-04-05 DIAGNOSIS — R253 Fasciculation: Secondary | ICD-10-CM

## 2019-04-05 DIAGNOSIS — M5440 Lumbago with sciatica, unspecified side: Secondary | ICD-10-CM | POA: Diagnosis not present

## 2019-04-05 DIAGNOSIS — R202 Paresthesia of skin: Secondary | ICD-10-CM

## 2019-04-05 LAB — CK: Total CK: 27 U/L (ref 7–232)

## 2019-04-05 LAB — URINALYSIS, ROUTINE W REFLEX MICROSCOPIC
Bilirubin Urine: NEGATIVE
Hgb urine dipstick: NEGATIVE
Ketones, ur: 15 — AB
Leukocytes,Ua: NEGATIVE
Nitrite: NEGATIVE
Specific Gravity, Urine: 1.02 (ref 1.000–1.030)
Total Protein, Urine: 30 — AB
Urine Glucose: NEGATIVE
Urobilinogen, UA: 0.2 (ref 0.0–1.0)
WBC, UA: NONE SEEN (ref 0–?)
pH: 7 (ref 5.0–8.0)

## 2019-04-05 LAB — VITAMIN B12: Vitamin B-12: 657 pg/mL (ref 211–911)

## 2019-04-05 LAB — BASIC METABOLIC PANEL
BUN: 16 mg/dL (ref 6–23)
CO2: 29 mEq/L (ref 19–32)
Calcium: 9.3 mg/dL (ref 8.4–10.5)
Chloride: 98 mEq/L (ref 96–112)
Creatinine, Ser: 0.7 mg/dL (ref 0.40–1.50)
GFR: 108.97 mL/min (ref >60.00)
Glucose, Bld: 93 mg/dL (ref 70–99)
Potassium: 3.8 mEq/L (ref 3.5–5.1)
Sodium: 134 mEq/L — ABNORMAL LOW (ref 135–145)

## 2019-04-05 LAB — CORTISOL: Cortisol, Plasma: 23.4 ug/dL

## 2019-04-05 LAB — TSH: TSH: 1.4 u[IU]/mL (ref 0.35–4.50)

## 2019-04-05 LAB — MAGNESIUM: Magnesium: 2.1 mg/dL (ref 1.5–2.5)

## 2019-04-05 MED ORDER — NEOMYCIN-POLYMYXIN-HC 3.5-10000-1 OT SOLN: 3.0000 [drp] | Freq: Three times a day (TID) | OTIC | 3 refills | Status: DC

## 2019-04-05 MED ORDER — CEFUROXIME AXETIL 250 MG PO TABS: 250.0000 mg | ORAL_TABLET | Freq: Two times a day (BID) | ORAL | 0 refills | Status: DC

## 2019-04-05 NOTE — Assessment & Plan Note (Signed)
OM and OE Cortisporin, Ceftin

## 2019-04-05 NOTE — Progress Notes (Signed)
Subjective:  Patient ID: Vernon Bishop, male    DOB: February 15, 1941  Age: 79 y.o. MRN: [id number]  CC: No chief complaint on file.   HPI Vernon Bishop presents for LBP, HTN C/o severe L hip pain C/o R ear pain - worse w/chewing C/o wt loss  Outpatient Medications Prior to Visit  Medication Sig Dispense Refill  . acetaminophen (TYLENOL) 500 MG tablet Take 2 tablets (1,000 mg total) by mouth every 8 (eight) hours as needed for mild pain. 30 tablet 0  . amLODipine (NORVASC) 10 MG tablet TAKE 1 TABLET BY MOUTH EVERY MORNING. 90 tablet 3  . aspirin EC 81 MG tablet Take 81 mg by mouth daily.    . benazepril-hydrochlorthiazide (LOTENSIN HCT) 20-12.5 MG per tablet TAKE 2 TABLETS BY MOUTH DAILY. 60 tablet 5  . buPROPion (WELLBUTRIN XL) 150 MG 24 hr tablet TAKE 3 TABLETS BY MOUTH EVERY MORNING 90 tablet 3  . Cholecalciferol (VITAMIN D3) 1000 UNIT tablet Take 1,000 Units by mouth daily.      . ciclopirox (PENLAC) 8 % solution Apply topically at bedtime. Apply over nail and surrounding skin. Apply daily over previous coat. After seven (7) days, may remove with alcohol and continue cycle. 6.6 mL 1  . clotrimazole-betamethasone (LOTRISONE) cream APPLY TO AFFECTED AREA 2 TIMES A DAY. 45 g 1  . Cyanocobalamin (VITAMIN B-12 CR) 1000 MCG TBCR Take 1,000 mcg by mouth daily.      Marland Kitchen docusate sodium 100 MG CAPS Take 100 mg by mouth 2 (two) times daily. 10 capsule 0  . dutasteride (AVODART) 0.5 MG capsule TAKE 1 CAPSULE BY MOUTH DAILY. 30 capsule 11  . ferrous sulfate 325 (65 FE) MG tablet Take 1 tablet (325 mg total) by mouth 3 (three) times daily after meals.  3  . FLUoxetine (PROZAC) 40 MG capsule Take 1 capsule (40 mg total) by mouth daily. 90 capsule 3  . gabapentin (NEURONTIN) 300 MG capsule TAKE 1 CAPSULE BY MOUTH THREE TIMES A DAY 90 capsule 11  . ketoconazole (NIZORAL) 2 % shampoo APPLY AS DIRECTED 2 TIMES A WEEK 120 mL 0  . morphine (MS CONTIN) 30 MG 12 hr tablet Take 1 tablet (30 mg total) by mouth  every 12 (twelve) hours. 60 tablet 0  . morphine (MS CONTIN) 30 MG 12 hr tablet Take 1 tablet (30 mg total) by mouth every 12 (twelve) hours. 60 tablet 0  . Multiple Vitamin (MULTIVITAMIN) tablet Take 1 tablet by mouth daily.    . polyethylene glycol (MIRALAX / GLYCOLAX) packet Take 17 g by mouth 2 (two) times daily. 14 each 0  . propranolol (INDERAL) 80 MG tablet TAKE 1 TABLET BY MOUTH 3 TIMES DAILY. 270 tablet 3  . testosterone cypionate (DEPOTESTOSTERONE CYPIONATE) 200 MG/ML injection INJECT 3 MILLILITERS INTO THE MUSCLE EVERY 14 DAYS 10 mL 5  . tiZANidine (ZANAFLEX) 4 MG tablet TAKE 1 TABLET BY MOUTH EVERY 8 HOURS AS NEEDED FOR MUSCLE SPASMS. 60 tablet 1  . traZODone (DESYREL) 50 MG tablet TAKE 1 TO 2 TABLETS BY MOUTH AT BEDTIME. 60 tablet 5   No facility-administered medications prior to visit.    ROS: Review of Systems  Constitutional: Positive for fatigue. Negative for appetite change and unexpected weight change.  HENT: Negative for congestion, nosebleeds, sneezing, sore throat and trouble swallowing.   Eyes: Negative for itching and visual disturbance.  Respiratory: Negative for cough.   Cardiovascular: Negative for chest pain, palpitations and leg swelling.  Gastrointestinal: Negative for abdominal  distention, blood in stool, diarrhea and nausea.  Genitourinary: Negative for frequency and hematuria.  Musculoskeletal: Positive for arthralgias, back pain and gait problem. Negative for joint swelling and neck pain.  Skin: Negative for rash.  Neurological: Positive for dizziness and weakness. Negative for tremors and speech difficulty.  Psychiatric/Behavioral: Positive for decreased concentration. Negative for agitation, dysphoric mood, sleep disturbance and suicidal ideas. The patient is nervous/anxious.     Objective:  BP 132/60 (BP Location: Left Arm, Patient Position: Sitting, Cuff Size: Large)   Pulse (!) 56   Temp 98.9 F (37.2 C) (Oral)   Ht 5\' 6"  (1.676 m)   Wt 205 lb  (93 kg)   SpO2 96%   BMI 33.09 kg/m   BP Readings from Last 3 Encounters:  04/05/19 132/60  02/21/19 130/70  02/01/19 (!) 180/78    Wt Readings from Last 3 Encounters:  04/05/19 205 lb (93 kg)  02/21/19 210 lb (95.3 kg)  02/01/19 210 lb (95.3 kg)    Physical Exam Constitutional:      General: He is not in acute distress.    Appearance: He is well-developed.     Comments: NAD  Eyes:     Conjunctiva/sclera: Conjunctivae normal.     Pupils: Pupils are equal, round, and reactive to light.  Neck:     Thyroid: No thyromegaly.     Vascular: No JVD.  Cardiovascular:     Rate and Rhythm: Normal rate and regular rhythm.     Heart sounds: Normal heart sounds. No murmur. No friction rub. No gallop.   Pulmonary:     Effort: Pulmonary effort is normal. No respiratory distress.     Breath sounds: Normal breath sounds. No wheezing or rales.  Chest:     Chest wall: No tenderness.  Abdominal:     General: Bowel sounds are normal. There is no distension.     Palpations: Abdomen is soft. There is no mass.     Tenderness: There is no abdominal tenderness. There is no guarding or rebound.  Musculoskeletal:        General: Tenderness present. Normal range of motion.     Cervical back: Normal range of motion.  Lymphadenopathy:     Cervical: No cervical adenopathy.  Skin:    General: Skin is warm and dry.     Findings: No rash.  Neurological:     Mental Status: He is alert and oriented to person, place, and time.     Cranial Nerves: No cranial nerve deficit.     Motor: No abnormal muscle tone.     Coordination: Coordination normal.     Gait: Gait normal.     Deep Tendon Reflexes: Reflexes are normal and symmetric.  Psychiatric:        Behavior: Behavior normal.        Thought Content: Thought content normal.        Judgment: Judgment normal.   walker L hip w/pain wasted muscles on hands, fasciculations Ataxic gait OE R, TM dull  Lab Results  Component Value Date   WBC 5.8  10/19/2018   HGB 16.2 10/19/2018   HCT 48.1 10/19/2018   PLT 190.0 10/19/2018   GLUCOSE 124 (H) 10/19/2018   CHOL 133 10/19/2018   TRIG 74.0 10/19/2018   HDL 36.90 (L) 10/19/2018   LDLCALC 81 10/19/2018   ALT 31 10/19/2018   AST 22 10/19/2018   NA 138 10/19/2018   K 4.1 10/19/2018   CL 104 10/19/2018   CREATININE  0.92 10/19/2018   BUN 18 10/19/2018   CO2 25 10/19/2018   TSH 2.16 10/19/2018   PSA 1.01 10/19/2018   INR 1.00 03/07/2013   HGBA1C 5.1 01/10/2014    DG Lumbar Spine 2-3 Views  Result Date: 02/02/2019 CLINICAL DATA:  Severe low back pain and right-sided leg pain for 4 days. EXAM: LUMBAR SPINE - 2-3 VIEW COMPARISON:  None FINDINGS: Curvature of the lumbar spine is convex towards the left. Anterolisthesis of L4 on L5 is identified. Multi level disc space narrowing and large ventral endplate spurring noted. The vertebral body heights are well preserved. No fractures identified. Previous right hip arthroplasty noted. IMPRESSION: Mild scoliosis and moderate multi level degenerative disc disease. Electronically Signed   By: Kerby Moors M.D.   On: 02/02/2019 08:33    Assessment & Plan:   There are no diagnoses linked to this encounter.   No orders of the defined types were placed in this encounter.    Follow-up: No follow-ups on file.  Walker Kehr, MD

## 2019-04-05 NOTE — Patient Instructions (Signed)
Duoderm patch

## 2019-04-05 NOTE — Assessment & Plan Note (Signed)
wasted muscles on hands, fasciculations Ataxic gait  Neurol ref

## 2019-04-05 NOTE — Assessment & Plan Note (Signed)
L hip OR Ref to Dr Alvan Dame

## 2019-04-05 NOTE — Assessment & Plan Note (Signed)
Chronic MS contin

## 2019-04-06 LAB — PMP SCREEN PROFILE (10S), URINE
Amphetamine Scrn, Ur: NEGATIVE ng/mL
BARBITURATE SCREEN URINE: NEGATIVE ng/mL
BENZODIAZEPINE SCREEN, URINE: NEGATIVE ng/mL
CANNABINOIDS UR QL SCN: NEGATIVE ng/mL
Cocaine (Metab) Scrn, Ur: NEGATIVE ng/mL
Creatinine(Crt), U: 128.3 mg/dL (ref 20.0–300.0)
Methadone Screen, Urine: NEGATIVE ng/mL
OXYCODONE+OXYMORPHONE UR QL SCN: NEGATIVE ng/mL
Opiate Scrn, Ur: POSITIVE ng/mL — AB
Ph of Urine: 6.5 (ref 4.5–8.9)
Phencyclidine Qn, Ur: NEGATIVE ng/mL
Propoxyphene Scrn, Ur: NEGATIVE ng/mL

## 2019-04-06 LAB — VITAMIN D 25 HYDROXY (VIT D DEFICIENCY, FRACTURES): VITD: 35.78 ng/mL (ref 30.00–100.00)

## 2019-04-10 ENCOUNTER — Encounter: Payer: Self-pay | Admitting: Neurology

## 2019-04-10 ENCOUNTER — Encounter: Payer: Self-pay | Admitting: Internal Medicine

## 2019-04-17 DIAGNOSIS — M1612 Unilateral primary osteoarthritis, left hip: Secondary | ICD-10-CM | POA: Diagnosis not present

## 2019-04-17 DIAGNOSIS — Z96641 Presence of right artificial hip joint: Secondary | ICD-10-CM | POA: Diagnosis not present

## 2019-04-17 DIAGNOSIS — M25552 Pain in left hip: Secondary | ICD-10-CM | POA: Diagnosis not present

## 2019-04-19 ENCOUNTER — Encounter: Payer: Self-pay | Admitting: Internal Medicine

## 2019-04-19 ENCOUNTER — Ambulatory Visit (INDEPENDENT_AMBULATORY_CARE_PROVIDER_SITE_OTHER): Payer: PPO | Admitting: Internal Medicine

## 2019-04-19 ENCOUNTER — Other Ambulatory Visit: Payer: Self-pay

## 2019-04-19 ENCOUNTER — Other Ambulatory Visit: Payer: Self-pay | Admitting: Internal Medicine

## 2019-04-19 ENCOUNTER — Ambulatory Visit (INDEPENDENT_AMBULATORY_CARE_PROVIDER_SITE_OTHER): Payer: PPO

## 2019-04-19 ENCOUNTER — Ambulatory Visit: Payer: PPO | Attending: Internal Medicine

## 2019-04-19 VITALS — BP 154/72 | HR 51 | Temp 99.0°F | Ht 66.0 in | Wt 204.0 lb

## 2019-04-19 DIAGNOSIS — Z01818 Encounter for other preprocedural examination: Secondary | ICD-10-CM

## 2019-04-19 DIAGNOSIS — R29898 Other symptoms and signs involving the musculoskeletal system: Secondary | ICD-10-CM

## 2019-04-19 DIAGNOSIS — Z23 Encounter for immunization: Secondary | ICD-10-CM | POA: Insufficient documentation

## 2019-04-19 DIAGNOSIS — F411 Generalized anxiety disorder: Secondary | ICD-10-CM | POA: Diagnosis not present

## 2019-04-19 DIAGNOSIS — E119 Type 2 diabetes mellitus without complications: Secondary | ICD-10-CM | POA: Diagnosis not present

## 2019-04-19 DIAGNOSIS — I480 Paroxysmal atrial fibrillation: Secondary | ICD-10-CM | POA: Diagnosis not present

## 2019-04-19 DIAGNOSIS — F339 Major depressive disorder, recurrent, unspecified: Secondary | ICD-10-CM

## 2019-04-19 MED ORDER — MORPHINE SULFATE ER 30 MG PO TBCR: 30.0000 mg | EXTENDED_RELEASE_TABLET | Freq: Two times a day (BID) | ORAL | 0 refills | Status: DC

## 2019-04-19 NOTE — Progress Notes (Signed)
Subjective:  Patient ID: Vernon Bishop, male    DOB: [birth year]  Age: 79 y.o. MRN: [id number]  CC: No chief complaint on file.   HPI Vernon Bishop presents for a medical consultation Req by Dr Vernon Bishop Reason: L THR under spinal anesthesia med clearance Hx: Vernon Bishop suffering with chronic low back pain and arthritis-on chronic opioid therapy (stable), hypertension (stable), hypogonadism (on testosterone injections)-stable, depression and insomnia.   Past Medical History:  Diagnosis Date  . Atrial fibrillation ([place])    1992  . DDD (degenerative disc disease)   . DDD (degenerative disc disease)   . Depression   . Diabetes mellitus    type II  - PT STATES NOT ON ANY DIABETIC MEDICINES  . ED (erectile dysfunction)   . Hyperlipidemia   . Hypertension   . Hypogonadism male   . LBP (low back pain)   . MVP (mitral valve prolapse)   . OSA on CPAP   . Osteoarthritis   . Osteoarthritis, hand    SEVERE LOWER BACK PAIN, AND RESTLESS LEG SYNDROME  . Palpitations   . PTSD (post-traumatic stress disorder)   . Swallowing problem    HX OF PILLS GETTING "STUCK" IN THROAT AT TIMES  . Vitamin D deficiency    Past Surgical History:  Procedure Laterality Date  . APPENDECTOMY    . BACK SURGERY  1987  . CATARACT EXTRACTION    . CERVICAL LAMINECTOMY  2004   [name], rod was placed- HAS ROM LIMITATIONS  . CHOLECYSTECTOMY    . COLONOSCOPY    . KNEE SURGERY     left knee/arthroscopic  . TOTAL HIP ARTHROPLASTY Right 03/14/2013   Procedure: RIGHT TOTAL HIP ARTHROPLASTY ANTERIOR APPROACH;  Surgeon: Vernon Pole, MD;  Location: [facility];  Service: Orthopedics;  Laterality: Right;    reports that he quit smoking about 48 years ago. His smoking use included cigarettes. He has a 6.00 pack-year smoking history. He has never used smokeless tobacco. He reports current alcohol use of about 2.0 standard drinks of alcohol per week. He reports that he does not use drugs. family history includes Allergies in  his mother; Coronary artery disease in an other family member; Diabetes in an other family member; Esophageal cancer in his maternal aunt; Heart attack (age of onset: 88) in his father; Heart disease in his brother, father, and mother; Hypertension in an other family member; Other in his paternal grandmother; Rheum arthritis in his paternal grandmother; Stroke in his mother. Allergies  Allergen Reactions  . Abilify [Aripiprazole]     SOB feeling  . Cymbalta [Duloxetine Hcl]     constipation  . Digoxin And Related     HR 41  . Duloxetine     REACTION: constipation  . Prednisolone     Edema, wt gain  . Temazepam     Outpatient Medications Prior to Visit  Medication Sig Dispense Refill  . acetaminophen (TYLENOL) 500 MG tablet Take 2 tablets (1,000 mg total) by mouth every 8 (eight) hours as needed for mild pain. 30 tablet 0  . amLODipine (NORVASC) 10 MG tablet TAKE 1 TABLET BY MOUTH EVERY MORNING. 90 tablet 3  . aspirin EC 81 MG tablet Take 81 mg by mouth daily.    . benazepril-hydrochlorthiazide (LOTENSIN HCT) 20-12.5 MG per tablet TAKE 2 TABLETS BY MOUTH DAILY. 60 tablet 5  . buPROPion (WELLBUTRIN XL) 150 MG 24 hr tablet TAKE 3 TABLETS BY MOUTH EVERY MORNING 90 tablet 3  . cefUROXime (CEFTIN)  250 MG tablet Take 1 tablet (250 mg total) by mouth 2 (two) times daily. 20 tablet 0  . Cholecalciferol (VITAMIN D3) 1000 UNIT tablet Take 1,000 Units by mouth daily.      . ciclopirox (PENLAC) 8 % solution Apply topically at bedtime. Apply over nail and surrounding skin. Apply daily over previous coat. After seven (7) days, may remove with alcohol and continue cycle. 6.6 mL 1  . clotrimazole-betamethasone (LOTRISONE) cream APPLY TO AFFECTED AREA 2 TIMES A DAY. 45 g 1  . Cyanocobalamin (VITAMIN B-12 CR) 1000 MCG TBCR Take 1,000 mcg by mouth daily.      Marland Kitchen docusate sodium 100 MG CAPS Take 100 mg by mouth 2 (two) times daily. 10 capsule 0  . dutasteride (AVODART) 0.5 MG capsule TAKE 1 CAPSULE BY MOUTH  DAILY. 30 capsule 11  . ferrous sulfate 325 (65 FE) MG tablet Take 1 tablet (325 mg total) by mouth 3 (three) times daily after meals.  3  . FLUoxetine (PROZAC) 40 MG capsule Take 1 capsule (40 mg total) by mouth daily. 90 capsule 3  . gabapentin (NEURONTIN) 300 MG capsule TAKE 1 CAPSULE BY MOUTH THREE TIMES A DAY 90 capsule 11  . ketoconazole (NIZORAL) 2 % shampoo APPLY AS DIRECTED 2 TIMES A WEEK 120 mL 0  . morphine (MS CONTIN) 30 MG 12 hr tablet Take 1 tablet (30 mg total) by mouth every 12 (twelve) hours. 60 tablet 0  . morphine (MS CONTIN) 30 MG 12 hr tablet Take 1 tablet (30 mg total) by mouth every 12 (twelve) hours. 60 tablet 0  . Multiple Vitamin (MULTIVITAMIN) tablet Take 1 tablet by mouth daily.    Marland Kitchen neomycin-polymyxin-hydrocortisone (CORTISPORIN) OTIC solution Place 3 drops into the right ear 3 (three) times daily. 10 mL 3  . polyethylene glycol (MIRALAX / GLYCOLAX) packet Take 17 g by mouth 2 (two) times daily. 14 each 0  . propranolol (INDERAL) 80 MG tablet TAKE 1 TABLET BY MOUTH 3 TIMES DAILY. 270 tablet 3  . testosterone cypionate (DEPOTESTOSTERONE CYPIONATE) 200 MG/ML injection INJECT 3 MILLILITERS INTO THE MUSCLE EVERY 14 DAYS 10 mL 5  . tiZANidine (ZANAFLEX) 4 MG tablet TAKE 1 TABLET BY MOUTH EVERY 8 HOURS AS NEEDED FOR MUSCLE SPASMS. 60 tablet 1  . traZODone (DESYREL) 50 MG tablet TAKE 1 TO 2 TABLETS BY MOUTH AT BEDTIME. 60 tablet 5   No facility-administered medications prior to visit.    ROS: Review of Systems  Constitutional: Positive for fatigue. Negative for appetite change and unexpected weight change.  HENT: Negative for congestion, nosebleeds, sneezing, sore throat and trouble swallowing.   Eyes: Negative for itching and visual disturbance.  Respiratory: Negative for cough.   Cardiovascular: Negative for chest pain, palpitations and leg swelling.  Gastrointestinal: Negative for abdominal distention, blood in stool, diarrhea and nausea.  Genitourinary: Negative  for frequency and hematuria.  Musculoskeletal: Positive for arthralgias, back pain and gait problem. Negative for joint swelling and neck pain.  Skin: Negative for rash.  Neurological: Negative for dizziness, tremors, speech difficulty and weakness.  Psychiatric/Behavioral: Negative for agitation, dysphoric mood, sleep disturbance and suicidal ideas. The patient is not nervous/anxious.     Objective:  BP (!) 154/72 (BP Location: Left Arm, Patient Position: Sitting, Cuff Size: Normal)   Pulse (!) 51   Temp 99 F (37.2 C) (Oral)   Ht 5\' 6"  (1.676 m)   Wt 204 lb (92.5 kg)   SpO2 93%   BMI 32.93 kg/m   BP  Readings from Last 3 Encounters:  04/19/19 (!) 154/72  04/05/19 132/60  02/21/19 130/70    Wt Readings from Last 3 Encounters:  04/19/19 204 lb (92.5 kg)  04/05/19 205 lb (93 kg)  02/21/19 210 lb (95.3 kg)    Physical Exam Constitutional:      General: He is not in acute distress.    Appearance: He is well-developed.     Comments: NAD  Eyes:     Conjunctiva/sclera: Conjunctivae normal.     Pupils: Pupils are equal, round, and reactive to light.  Neck:     Thyroid: No thyromegaly.     Vascular: No JVD.  Cardiovascular:     Rate and Rhythm: Normal rate and regular rhythm.     Heart sounds: Normal heart sounds. No murmur. No friction rub. No gallop.   Pulmonary:     Effort: Pulmonary effort is normal. No respiratory distress.     Breath sounds: Normal breath sounds. No wheezing or rales.  Chest:     Chest wall: No tenderness.  Abdominal:     General: Bowel sounds are normal. There is no distension.     Palpations: Abdomen is soft. There is no mass.     Tenderness: There is no abdominal tenderness. There is no guarding or rebound.  Musculoskeletal:        General: Tenderness present. Normal range of motion.     Cervical back: Normal range of motion.  Lymphadenopathy:     Cervical: No cervical adenopathy.  Skin:    General: Skin is warm and dry.     Findings: No  rash.  Neurological:     Mental Status: He is alert and oriented to person, place, and time.     Cranial Nerves: No cranial nerve deficit.     Motor: No abnormal muscle tone.     Coordination: Coordination normal.     Gait: Gait abnormal.     Deep Tendon Reflexes: Reflexes are normal and symmetric.  Psychiatric:        Behavior: Behavior normal.        Thought Content: Thought content normal.        Judgment: Judgment normal.     Procedure: EKG Indication: pre-op; PAF Impression: NSR. No acute changes.  Lab Results  Component Value Date   WBC 5.8 10/19/2018   HGB 16.2 10/19/2018   HCT 48.1 10/19/2018   PLT 190.0 10/19/2018   GLUCOSE 93 04/05/2019   CHOL 133 10/19/2018   TRIG 74.0 10/19/2018   HDL 36.90 (L) 10/19/2018   LDLCALC 81 10/19/2018   ALT 31 10/19/2018   AST 22 10/19/2018   NA 134 (L) 04/05/2019   K 3.8 04/05/2019   CL 98 04/05/2019   CREATININE 0.70 04/05/2019   BUN 16 04/05/2019   CO2 29 04/05/2019   TSH 1.40 04/05/2019   PSA 1.01 10/19/2018   INR 1.00 03/07/2013   HGBA1C 5.1 01/10/2014    DG Lumbar Spine 2-3 Views  Result Date: 02/02/2019 CLINICAL DATA:  Severe low back pain and right-sided leg pain for 4 days. EXAM: LUMBAR SPINE - 2-3 VIEW COMPARISON:  None FINDINGS: Curvature of the lumbar spine is convex towards the left. Anterolisthesis of L4 on L5 is identified. Multi level disc space narrowing and large ventral endplate spurring noted. The vertebral body heights are well preserved. No fractures identified. Previous right hip arthroplasty noted. IMPRESSION: Mild scoliosis and moderate multi level degenerative disc disease. Electronically Signed   By: Queen Slough.D.  On: 02/02/2019 08:33    Assessment & Plan:   There are no diagnoses linked to this encounter.   No orders of the defined types were placed in this encounter.    Follow-up: No follow-ups on file.  Walker Kehr, MD

## 2019-04-19 NOTE — Progress Notes (Signed)
   Covid-19 Vaccination Clinic  Name:  Vernon Bishop    MRN: [id number] DOB: 1940/05/26  04/19/2019  [name] was observed post Covid-19 immunization for 15 minutes without incidence. He was provided with Vaccine Information Sheet and instruction to access the V-Safe system.   [name] was instructed to call 911 with any severe reactions post vaccine: Marland Kitchen Difficulty breathing  . Swelling of your face and throat  . A fast heartbeat  . A bad rash all over your body  . Dizziness and weakness    Immunizations Administered    Name Date Dose VIS Date Route   Pfizer COVID-19 Vaccine 04/19/2019  9:35 AM 0.3 mL 03/10/2019 Intramuscular   Manufacturer: [employer], [employer]   Lot: [id number]   [place]: [id number]

## 2019-04-19 NOTE — Assessment & Plan Note (Signed)
Lab Results  Component Value Date   HGBA1C 5.1 01/10/2014

## 2019-04-19 NOTE — Assessment & Plan Note (Addendum)
ASA 162 mg EKG

## 2019-04-19 NOTE — Assessment & Plan Note (Signed)
Neurology appt in Feb pending

## 2019-04-20 NOTE — Telephone Encounter (Signed)
RX already sent, please deny [id number]

## 2019-04-22 ENCOUNTER — Other Ambulatory Visit: Payer: Self-pay | Admitting: Internal Medicine

## 2019-04-23 NOTE — Assessment & Plan Note (Signed)
Continue with Wellbutrin XL and with fluoxetine.  Follow-up with Dr. Casimiro Needle after surgery when mobile again

## 2019-04-23 NOTE — Assessment & Plan Note (Signed)
The patient is medically clear for his planned left total hip replacement under spinal anesthesia.  He is perioperative risk is average. Will obtain EKG, chest x-ray, lab work Thank you

## 2019-04-24 ENCOUNTER — Ambulatory Visit: Payer: PPO | Admitting: Internal Medicine

## 2019-04-26 NOTE — Progress Notes (Signed)
We now use an EKG machine that prints off the EKG rather than it going straight into epic. I have sent the copy to be scanned into the chart. Is there anything I need to do to drop the charge?  Sincerely,  Raynald Kemp, CMA

## 2019-05-05 ENCOUNTER — Other Ambulatory Visit: Payer: Self-pay

## 2019-05-05 ENCOUNTER — Encounter: Payer: Self-pay | Admitting: Neurology

## 2019-05-05 ENCOUNTER — Ambulatory Visit: Payer: PPO | Admitting: Neurology

## 2019-05-05 VITALS — BP 132/72 | HR 53 | Resp 16 | Ht 72.0 in | Wt 203.2 lb

## 2019-05-05 DIAGNOSIS — G5622 Lesion of ulnar nerve, left upper limb: Secondary | ICD-10-CM

## 2019-05-05 DIAGNOSIS — R531 Weakness: Secondary | ICD-10-CM | POA: Diagnosis not present

## 2019-05-05 NOTE — Progress Notes (Signed)
[facility] Clinic Note - Initial Visit   Date: 05/05/19  GAYLORD CARRINO MRN: [id number] DOB: 04-24-1940   Dear Dr. Alain Marion:   Thank you for your kind referral of Vernon Bishop for consultation of generalized weakness. Although his history is well known to you, please allow [place] to reiterate it for the purpose of our medical record. The patient was accompanied to the clinic by wife who also provided history.    History of Present Illness: Vernon Bishop is a 79 y.o. right-handed male with atrial fibrillation, hypertension. Hyperlipidemia, s/p cervical fusion presenting for evaluation of generalized weakness.   Over the past few years, whife has noticed that patient was losing muscle mass which was worse in the legs, which has become more apparent over the past 3 months.   He is very sedentary and spends most of his day sitting in a chair.  He attributes some of this because of left hip pain and is scheduled to have surgery later this month. He walks with a walker and cane, mostly because of hip pain.   He has left hand weakness for many years.  Upon further questioning, he tends to rest his elbow on surfaces.  He has constant numbness over the left 5th finger.  He also has mild numbness in the toes and feet.   He denies muscle cramps or twitches.  No problems with swallowing/talking, facial weakness, or shortness of breath. He has lost 20lb since the summer, when he claims is intentional.  Out-side paper records, electronic medical record, and images have been reviewed where available and summarized as:  Lab Results  Component Value Date   HGBA1C 5.1 01/10/2014   Lab Results  Component Value Date   [id number] 04/05/2019   Lab Results  Component Value Date   TSH 1.40 04/05/2019   Lab Results  Component Value Date   ESRSEDRATE 18 10/16/2011   Lab Results  Component Value Date   CKTOTAL 27 04/05/2019    Past Medical History:  Diagnosis  Date  . Atrial fibrillation ([place])    1992  . DDD (degenerative disc disease)   . DDD (degenerative disc disease)   . Depression   . Diabetes mellitus    type II  - PT STATES NOT ON ANY DIABETIC MEDICINES  . ED (erectile dysfunction)   . Hyperlipidemia   . Hypertension   . Hypogonadism male   . LBP (low back pain)   . MVP (mitral valve prolapse)   . OSA on CPAP   . Osteoarthritis   . Osteoarthritis, hand    SEVERE LOWER BACK PAIN, AND RESTLESS LEG SYNDROME  . Palpitations   . PTSD (post-traumatic stress disorder)   . Swallowing problem    HX OF PILLS GETTING "STUCK" IN THROAT AT TIMES  . Vitamin D deficiency     Past Surgical History:  Procedure Laterality Date  . APPENDECTOMY    . BACK SURGERY  1987  . CATARACT EXTRACTION    . CERVICAL LAMINECTOMY  2004   [name], rod was placed- HAS ROM LIMITATIONS  . CHOLECYSTECTOMY    . COLONOSCOPY    . KNEE SURGERY     left knee/arthroscopic  . TOTAL HIP ARTHROPLASTY Right 03/14/2013   Procedure: RIGHT TOTAL HIP ARTHROPLASTY ANTERIOR APPROACH;  Surgeon: [name], MD;  Location: [facility];  Service: Orthopedics;  Laterality: Right;     Medications:  Outpatient Encounter Medications as of 05/05/2019  Medication Sig  . acetaminophen (  TYLENOL) 500 MG tablet Take 2 tablets (1,000 mg total) by mouth every 8 (eight) hours as needed for mild pain.  Marland Kitchen amLODipine (NORVASC) 10 MG tablet TAKE 1 TABLET BY MOUTH EVERY MORNING.  Marland Kitchen aspirin EC 81 MG tablet Take 81 mg by mouth daily.  . benazepril-hydrochlorthiazide (LOTENSIN HCT) 20-12.5 MG per tablet TAKE 2 TABLETS BY MOUTH DAILY.  Marland Kitchen buPROPion (WELLBUTRIN XL) 150 MG 24 hr tablet TAKE 3 TABLETS BY MOUTH EVERY MORNING  . cefUROXime (CEFTIN) 250 MG tablet Take 1 tablet (250 mg total) by mouth 2 (two) times daily.  . Cholecalciferol (VITAMIN D3) 1000 UNIT tablet Take 1,000 Units by mouth daily.    . ciclopirox (PENLAC) 8 % solution Apply topically at bedtime. Apply over nail and surrounding skin.  Apply daily over previous coat. After seven (7) days, may remove with alcohol and continue cycle.  . clotrimazole-betamethasone (LOTRISONE) cream APPLY TO AFFECTED AREA 2 TIMES A DAY.  Marland Kitchen Cyanocobalamin (VITAMIN B-12 CR) 1000 MCG TBCR Take 1,000 mcg by mouth daily.    Marland Kitchen docusate sodium 100 MG CAPS Take 100 mg by mouth 2 (two) times daily.  Marland Kitchen dutasteride (AVODART) 0.5 MG capsule TAKE 1 CAPSULE BY MOUTH DAILY.  . ferrous sulfate 325 (65 FE) MG tablet Take 1 tablet (325 mg total) by mouth 3 (three) times daily after meals.  Marland Kitchen FLUoxetine (PROZAC) 40 MG capsule Take 1 capsule (40 mg total) by mouth daily.  Marland Kitchen gabapentin (NEURONTIN) 300 MG capsule TAKE 1 CAPSULE BY MOUTH THREE TIMES A DAY  . ketoconazole (NIZORAL) 2 % shampoo APPLY AS DIRECTED 2 TIMES A WEEK  . morphine (MS CONTIN) 30 MG 12 hr tablet Take 1 tablet (30 mg total) by mouth every 12 (twelve) hours.  Marland Kitchen morphine (MS CONTIN) 30 MG 12 hr tablet Take 1 tablet (30 mg total) by mouth every 12 (twelve) hours.  . Multiple Vitamin (MULTIVITAMIN) tablet Take 1 tablet by mouth daily.  Marland Kitchen neomycin-polymyxin-hydrocortisone (CORTISPORIN) OTIC solution Place 3 drops into the right ear 3 (three) times daily.  . polyethylene glycol (MIRALAX / GLYCOLAX) packet Take 17 g by mouth 2 (two) times daily.  . propranolol (INDERAL) 80 MG tablet TAKE 1 TABLET BY MOUTH 3 TIMES DAILY.  Marland Kitchen testosterone cypionate (DEPOTESTOSTERONE CYPIONATE) 200 MG/ML injection INJECT 3 MILLILITERS INTO THE MUSCLE EVERY 14 DAYS  . tiZANidine (ZANAFLEX) 4 MG tablet TAKE 1 TABLET BY MOUTH EVERY 8 HOURS AS NEEDED FOR MUSCLE SPASMS.  Marland Kitchen traZODone (DESYREL) 50 MG tablet TAKE 1 TO 2 TABLETS BY MOUTH AT BEDTIME.   No facility-administered encounter medications on file as of 05/05/2019.    Allergies:  Allergies  Allergen Reactions  . Abilify [Aripiprazole]     SOB feeling  . Cymbalta [Duloxetine Hcl]     constipation  . Digoxin And Related     HR 41  . Duloxetine     REACTION: constipation    . Prednisolone     Edema, wt gain  . Temazepam     Family History: Family History  Problem Relation Age of Onset  . Allergies Mother   . Stroke Mother        Clotting disorders  . Heart disease Mother   . Heart disease Father   . Heart attack Father 84  . Heart disease Brother   . Rheum arthritis Paternal Grandmother   . Other Paternal Grandmother        Rheumatism  . Coronary artery disease Other   . Diabetes Other   .  Hypertension Other   . Esophageal cancer Maternal Aunt   . Colon cancer Neg Hx   . Rectal cancer Neg Hx   . Stomach cancer Neg Hx     Social History: Social History   Tobacco Use  . Smoking status: Former Smoker    Packs/day: 0.50    Years: 12.00    Pack years: 6.00    Types: Cigarettes    Quit date: 03/31/1971    Years since quitting: 48.1  . Smokeless tobacco: Never Used  . Tobacco comment: Started at age 13  Substance Use Topics  . Alcohol use: Yes    Alcohol/week: 2.0 standard drinks    Types: 2 Cans of beer per week    Comment: occasionally  . Drug use: No   Social History   Social History Narrative   FAMILY HISTORY   History of CAD Male 1st degree relative <50   History Diabetes 1st degree relative   History hypertension                Vital Signs:  BP 132/72 (BP Location: Left Arm, Patient Position: Sitting)   Pulse (!) 53   Resp 16   Ht 6' (1.829 m)   Wt 203 lb 3.2 oz (92.2 kg)   SpO2 97%   BMI 27.56 kg/m   Neurological Exam: MENTAL STATUS including orientation to time, place, person, recent and remote memory, attention span and concentration, language, and fund of knowledge is normal.  Speech is not dysarthric.  CRANIAL NERVES: II:  No visual field defects.    III-IV-VI: Pupils equal round and reactive to light.  Normal conjugate, extra-ocular eye movements in all directions of gaze.  No nystagmus.  No ptosis.   V:  Normal facial sensation.    VII:  Normal facial symmetry and movements.   VIII:  Normal hearing and  vestibular function.   IX-X:  Normal palatal movement.   XI:  Normal shoulder shrug and head rotation.   XII:  Normal tongue strength and range of motion, no deviation or fasciculation.  MOTOR:  Left FDI and ADM atrophy.  No atrophy of the left APB.  In the legs, there is loss of muscle bulk throughout, no focal atrophy. Rare fasciculations over the right forearm.  No other abnormal movements.  No pronator drift.   Upper Extremity:  Right  Left  Deltoid  4+/5   5-/5   Biceps  5/5   5/5   Triceps  5/5   5/5   Infraspinatus 5/5  5/5  Medial pectoralis 5/5  5/5  Wrist extensors  5/5   5/5   Wrist flexors  5/5   5/5   Finger extensors  5/5   5/5   Finger flexors  5/5   5/5   Dorsal interossei  5/5   4/5   Abductor pollicis  5/5   5/5   Tone (Ashworth scale)  0  0   Lower Extremity:  Right  Left  Hip flexors  5/5   5/5   Hip extensors  5/5   5/5   Adductor 5/5  5/5  Abductor 5/5  5/5  Knee flexors  5/5   5/5   Knee extensors  5/5   5/5   Dorsiflexors  5/5   5/5   Plantarflexors  5/5   5/5   Toe extensors  5/5   5/5   Toe flexors  5/5   5/5   Tone (Ashworth scale)  0  0  MSRs:  Right        Left                  brachioradialis 2+  2+  biceps 2+  2+  triceps 2+  2+  patellar 2+  2+  ankle jerk 1+  1+  Hoffman no  no  plantar response down  down   SENSORY:  Reduced vibration and temperature in the feet, intact above the ankles.  COORDINATION/GAIT: Normal finger-to- nose-finger.  Intact rapid alternating movements bilaterally. Gait appears mildly antalgic, slightly bowed, unassisted.   IMPRESSION: This is a 79 year old man referred for evaluation of generalized weakness and left hand atrophy.  I believe his hand atrophy is due to chronic ulnar neuropathy at the elbow, he does not have split hand phenomenon. He has some proximal arm weakness bilaterally, ?cervical radiculopathy vs arthritic. His CK was normal, making myopathy unlikely.  He does not have classic features to  suggest motor neuron disease.  His leg weakness may be stemming from degenerative lumbar disease. I will have him return to the office for NCS/EMG of the left arm and leg Consider imaging going forward  Neuropathy manifesting with numbness in the feet, likely age-related.  He does not have diabetes, history of heavy alcohol use, or family history of neuropathy.   Further recommendations pending results.   Thank you for allowing me to participate in patient's care.  If I can answer any additional questions, I would be pleased to do so.    Sincerely,    Ligaya Cormier K. Posey Pronto, DO

## 2019-05-05 NOTE — Patient Instructions (Signed)
Nerve testing of the left arm and leg ° °ELECTROMYOGRAM AND NERVE CONDUCTION STUDIES (EMG/NCS) INSTRUCTIONS ° °How to Prepare °The neurologist conducting the EMG will need to know if you have certain medical conditions. Tell the neurologist and other EMG lab personnel if you: °Have a pacemaker or any other electrical medical device °Take blood-thinning medications °Have hemophilia, a blood-clotting disorder that causes prolonged bleeding °Bathing °Take a shower or bath shortly before your exam in order to remove oils from your skin. Don’t apply lotions or creams before the exam.  °What to Expect °You’ll likely be asked to change into a hospital gown for the procedure and lie down on an examination table. The following explanations can help you understand what will happen during the exam.  °Electrodes. The neurologist or a technician places surface electrodes at various locations on your skin depending on where you’re experiencing symptoms. Or the neurologist may insert needle electrodes at different sites depending on your symptoms.  °Sensations. The electrodes will at times transmit a tiny electrical current that you may feel as a twinge or spasm. The needle electrode may cause discomfort or pain that usually ends shortly after the needle is removed. °If you are concerned about discomfort or pain, you may want to talk to the neurologist about taking a short break during the exam.  °Instructions. During the needle EMG, the neurologist will assess whether there is any spontaneous electrical activity when the muscle is at rest - activity that isn’t present in healthy muscle tissue - and the degree of activity when you slightly contract the muscle.  °He or she will give you instructions on resting and contracting a muscle at appropriate times. Depending on what muscles and nerves the neurologist is examining, he or she may ask you to change positions during the exam.  °After your EMG °You may experience some temporary,  minor bruising where the needle electrode was inserted into your muscle. This bruising should fade within several days. If it persists, contact your primary care doctor.  ° °

## 2019-05-08 ENCOUNTER — Ambulatory Visit: Payer: PPO | Admitting: Neurology

## 2019-05-09 NOTE — Patient Instructions (Addendum)
DUE TO COVID-19 ONLY ONE VISITOR IS ALLOWED TO COME WITH YOU AND STAY IN THE WAITING ROOM ONLY DURING PRE OP AND PROCEDURE DAY OF SURGERY. THE 1 VISITOR MAY VISIT WITH YOU AFTER SURGERY IN YOUR PRIVATE ROOM DURING VISITING HOURS ONLY!  YOU NEED TO HAVE A COVID 19 TEST ON: 05/19/19 @  2:50 PM      _, THIS TEST MUST BE DONE BEFORE SURGERY, COME  [place], [place] , [address].  ([place]) ONCE YOUR COVID TEST IS COMPLETED, PLEASE BEGIN THE QUARANTINE INSTRUCTIONS AS OUTLINED IN YOUR HANDOUT.                [name]    Your procedure is scheduled on: 05/23/19   Report to [facility] Main  Entrance   Report to admitting at: 9:15 AM     Call this number if you have problems the morning of surgery [phone]    Remember:    [place], NO [place].     Take these medicines the morning of surgery with A SIP OF WATER: AMLODIPINE,BUPROPION,FLUOXETINE,GABAPENTIN,DUTASTERIDE,PROPRANOLOL.  NO SOLID FOOD AFTER MIDNIGHT THE NIGHT PRIOR TO SURGERY. NOTHING BY MOUTH EXCEPT CLEAR LIQUIDS UNTIL: 8:45 AM . PLEASE FINISH GATORADE DRINK PER SURGEON ORDER  WHICH NEEDS TO BE COMPLETED AT: 8:45 AM .   CLEAR LIQUID DIET   Foods Allowed                                                                     Foods Excluded  Coffee and tea, regular and decaf                             liquids that you cannot  Plain Jell-O any favor except red or purple                                           see through such as: Fruit ices (not with fruit pulp)                                     milk, soups, orange juice  Iced Popsicles                                    All solid food Carbonated beverages, regular and diet                                    Cranberry, grape and apple juices Sports drinks like Gatorade Lightly seasoned clear broth or consume(fat free) Sugar, honey syrup  Sample  Menu Breakfast                                Lunch  Supper Cranberry juice                    Beef broth                            Chicken broth Jell-O                                     Grape juice                           Apple juice Coffee or tea                        Jell-O                                      Popsicle                                                Coffee or tea                        Coffee or tea  _____________________________________________________________________                                  Dennis Bast may not have any metal on your body including hair pins and              piercings  Do not wear jewelry, make-up, lotions, powders or perfumes, deodorant             Do not wear nail polish on your fingernails.  Do not shave  48 hours prior to surgery.              Men may shave face and neck.   Do not bring valuables to the hospital. [place].  Contacts, dentures or bridgework may not be worn into surgery.  Leave suitcase in the car. After surgery it may be brought to your room.                  - Preparing for Surgery Before surgery, you can play an important role.  Because skin is not sterile, your skin needs to be as free of germs as possible.  You can reduce the number of germs on your skin by washing with CHG (chlorahexidine gluconate) soap before surgery.  CHG is an antiseptic cleaner which kills germs and bonds with the skin to continue killing germs even after washing. Please DO NOT use if you have an allergy to CHG or antibacterial soaps.  If your skin becomes reddened/irritated stop using the CHG and inform your nurse when you arrive at Short Stay. Do not shave (including legs and underarms) for at least 48 hours prior to the first CHG shower.  You may shave your face/neck. Please follow these instructions carefully:  1.  Shower with CHG Soap the night  before surgery and the  morning of Surgery.  2.  If you choose to wash your hair, wash your hair first as usual with your  normal  shampoo.  3.  After you shampoo, rinse your hair and body thoroughly to remove the  shampoo.                           4.  Use CHG as you would any other liquid soap.  You can apply chg directly  to the skin and wash                       Gently with a scrungie or clean washcloth.  5.  Apply the CHG Soap to your body ONLY FROM THE NECK DOWN.   Do not use on face/ open                           Wound or open sores. Avoid contact with eyes, ears mouth and genitals (private parts).                       Wash face,  Genitals (private parts) with your normal soap.             6.  Wash thoroughly, paying special attention to the area where your surgery  will be performed.  7.  Thoroughly rinse your body with warm water from the neck down.  8.  DO NOT shower/wash with your normal soap after using and rinsing off  the CHG Soap.                9.  Pat yourself dry with a clean towel.            10.  Wear clean pajamas.            11.  Place clean sheets on your bed the night of your first shower and do not  sleep with pets. Day of Surgery : Do not apply any lotions/deodorants the morning of surgery.  Please wear clean clothes to the hospital/surgery center.  FAILURE TO FOLLOW THESE INSTRUCTIONS MAY RESULT IN THE CANCELLATION OF YOUR SURGERY PATIENT SIGNATURE_________________________________  NURSE SIGNATURE__________________________________  ________________________________________________________________________   Adam Phenix  An incentive spirometer is a tool that can help keep your lungs clear and active. This tool measures how well you are filling your lungs with each breath. Taking long deep breaths may help reverse or decrease the chance of developing breathing (pulmonary) problems (especially infection) following:  A long period of time when you are  unable to move or be active. BEFORE THE PROCEDURE   If the spirometer includes an indicator to show your best effort, your nurse or respiratory therapist will set it to a desired goal.  If possible, sit up straight or lean slightly forward. Try not to slouch.  Hold the incentive spirometer in an upright position. INSTRUCTIONS FOR USE  1. Sit on the edge of your bed if possible, or sit up as far as you can in bed or on a chair. 2. Hold the incentive spirometer in an upright position. 3. Breathe out normally. 4. Place the mouthpiece in your mouth and seal your lips tightly around it. 5. Breathe in slowly and as deeply as possible, raising the piston or the ball toward the top of the column. 6. Hold your breath for 3-5 seconds or  for as long as possible. Allow the piston or ball to fall to the bottom of the column. 7. Remove the mouthpiece from your mouth and breathe out normally. 8. Rest for a few seconds and repeat Steps 1 through 7 at least 10 times every 1-2 hours when you are awake. Take your time and take a few normal breaths between deep breaths. 9. The spirometer may include an indicator to show your best effort. Use the indicator as a goal to work toward during each repetition. 10. After each set of 10 deep breaths, practice coughing to be sure your lungs are clear. If you have an incision (the cut made at the time of surgery), support your incision when coughing by placing a pillow or rolled up towels firmly against it. Once you are able to get out of bed, walk around indoors and cough well. You may stop using the incentive spirometer when instructed by your caregiver.  RISKS AND COMPLICATIONS  Take your time so you do not get dizzy or light-headed.  If you are in pain, you may need to take or ask for pain medication before doing incentive spirometry. It is harder to take a deep breath if you are having pain. AFTER USE  Rest and breathe slowly and easily.  It can be helpful to  keep track of a log of your progress. Your caregiver can provide you with a simple table to help with this. If you are using the spirometer at home, follow these instructions: [place] IF:   You are having difficultly using the spirometer.  You have trouble using the spirometer as often as instructed.  Your pain medication is not giving enough relief while using the spirometer.  You develop fever of 100.5 F (38.1 C) or higher. SEEK IMMEDIATE MEDICAL CARE IF:   You cough up bloody sputum that had not been present before.  You develop fever of 102 F (38.9 C) or greater.  You develop worsening pain at or near the incision site. MAKE SURE YOU:   Understand these instructions.  Will watch your condition.  Will get help right away if you are not doing well or get worse. Document Released: 07/27/2006 Document Revised: 06/08/2011 Document Reviewed: 09/27/2006 [facility] Patient Information 2014 [place], [place].   ________________________________________________________________________

## 2019-05-10 ENCOUNTER — Encounter (HOSPITAL_COMMUNITY): Payer: Self-pay

## 2019-05-10 ENCOUNTER — Encounter
Admission: RE | Admit: 2019-05-10 | Discharge: 2019-05-10 | Disposition: A | Discharge status: Home / Self Care | Payer: PPO | Source: Ambulatory Visit | Attending: Orthopedic Surgery | Admitting: Orthopedic Surgery

## 2019-05-10 ENCOUNTER — Encounter (HOSPITAL_COMMUNITY)
Admission: RE | Admit: 2019-05-10 | Discharge: 2019-05-10 | Disposition: A | Discharge status: Home / Self Care | Payer: PPO | Source: Ambulatory Visit | Attending: Orthopedic Surgery | Admitting: Orthopedic Surgery

## 2019-05-10 ENCOUNTER — Other Ambulatory Visit: Payer: Self-pay

## 2019-05-10 ENCOUNTER — Ambulatory Visit: Payer: PPO

## 2019-05-10 DIAGNOSIS — Z87891 Personal history of nicotine dependence: Secondary | ICD-10-CM | POA: Insufficient documentation

## 2019-05-10 DIAGNOSIS — F4310 Post-traumatic stress disorder, unspecified: Secondary | ICD-10-CM | POA: Diagnosis not present

## 2019-05-10 DIAGNOSIS — Z01818 Encounter for other preprocedural examination: Secondary | ICD-10-CM | POA: Insufficient documentation

## 2019-05-10 DIAGNOSIS — I10 Essential (primary) hypertension: Secondary | ICD-10-CM | POA: Insufficient documentation

## 2019-05-10 DIAGNOSIS — G4733 Obstructive sleep apnea (adult) (pediatric): Secondary | ICD-10-CM | POA: Insufficient documentation

## 2019-05-10 DIAGNOSIS — E559 Vitamin D deficiency, unspecified: Secondary | ICD-10-CM | POA: Diagnosis not present

## 2019-05-10 DIAGNOSIS — Z9849 Cataract extraction status, unspecified eye: Secondary | ICD-10-CM | POA: Insufficient documentation

## 2019-05-10 DIAGNOSIS — F329 Major depressive disorder, single episode, unspecified: Secondary | ICD-10-CM | POA: Diagnosis not present

## 2019-05-10 DIAGNOSIS — Z9049 Acquired absence of other specified parts of digestive tract: Secondary | ICD-10-CM | POA: Diagnosis not present

## 2019-05-10 DIAGNOSIS — I4891 Unspecified atrial fibrillation: Secondary | ICD-10-CM | POA: Diagnosis not present

## 2019-05-10 DIAGNOSIS — Z23 Encounter for immunization: Secondary | ICD-10-CM | POA: Insufficient documentation

## 2019-05-10 DIAGNOSIS — Z792 Long term (current) use of antibiotics: Secondary | ICD-10-CM | POA: Insufficient documentation

## 2019-05-10 DIAGNOSIS — M1612 Unilateral primary osteoarthritis, left hip: Secondary | ICD-10-CM | POA: Diagnosis not present

## 2019-05-10 DIAGNOSIS — E291 Testicular hypofunction: Secondary | ICD-10-CM | POA: Insufficient documentation

## 2019-05-10 DIAGNOSIS — Z79899 Other long term (current) drug therapy: Secondary | ICD-10-CM | POA: Diagnosis not present

## 2019-05-10 DIAGNOSIS — E119 Type 2 diabetes mellitus without complications: Secondary | ICD-10-CM | POA: Diagnosis not present

## 2019-05-10 DIAGNOSIS — Z96641 Presence of right artificial hip joint: Secondary | ICD-10-CM | POA: Diagnosis not present

## 2019-05-10 DIAGNOSIS — Z791 Long term (current) use of non-steroidal anti-inflammatories (NSAID): Secondary | ICD-10-CM | POA: Diagnosis not present

## 2019-05-10 DIAGNOSIS — I341 Nonrheumatic mitral (valve) prolapse: Secondary | ICD-10-CM | POA: Insufficient documentation

## 2019-05-10 HISTORY — DX: Cardiac arrhythmia, unspecified: I49.9

## 2019-05-10 LAB — BASIC METABOLIC PANEL
Anion gap: 9 (ref 5–15)
BUN: 22 mg/dL (ref 8–23)
CO2: 30 mmol/L (ref 22–32)
Calcium: 9.2 mg/dL (ref 8.9–10.3)
Chloride: 100 mmol/L (ref 98–111)
Creatinine, Ser: 1.02 mg/dL (ref 0.61–1.24)
GFR calc Af Amer: >60 mL/min (ref >60)
GFR calc non Af Amer: >60 mL/min (ref >60)
Glucose, Bld: 92 mg/dL (ref 70–99)
Potassium: 4.1 mmol/L (ref 3.5–5.1)
Sodium: 139 mmol/L (ref 135–145)

## 2019-05-10 LAB — CBC
HCT: 42.1 % (ref 39.0–52.0)
Hemoglobin: 14 g/dL (ref 13.0–17.0)
MCH: 31.3 pg (ref 26.0–34.0)
MCHC: 33.3 g/dL (ref 30.0–36.0)
MCV: 94 fL (ref 80.0–100.0)
Platelets: 220 10*3/uL (ref 150–400)
RBC: 4.48 MIL/uL (ref 4.22–5.81)
RDW: 12.6 % (ref 11.5–15.5)
WBC: 7.3 10*3/uL (ref 4.0–10.5)
nRBC: 0 % (ref 0.0–0.2)

## 2019-05-10 LAB — SURGICAL PCR SCREEN
MRSA, PCR: NEGATIVE
Staphylococcus aureus: NEGATIVE

## 2019-05-10 LAB — HEMOGLOBIN A1C
Hgb A1c MFr Bld: 4.9 % (ref 4.8–5.6)
Mean Plasma Glucose: 93.93 mg/dL

## 2019-05-10 NOTE — Progress Notes (Signed)
   Covid-19 Vaccination Clinic  Name:  Vernon Bishop    MRN: [id number] DOB: Dec 25, 1940  05/10/2019  [name] was observed post Covid-19 immunization for 15 minutes without incidence. He was provided with Vaccine Information Sheet and instruction to access the V-Safe system.   [name] was instructed to call 911 with any severe reactions post vaccine: Marland Kitchen Difficulty breathing  . Swelling of your face and throat  . A fast heartbeat  . A bad rash all over your body  . Dizziness and weakness    Immunizations Administered    Name Date Dose VIS Date Route   Pfizer COVID-19 Vaccine 05/10/2019 12:37 PM 0.3 mL 03/10/2019 Intramuscular   Manufacturer: [place]   Lot: [id number]   [place]: [id number]

## 2019-05-10 NOTE — Progress Notes (Signed)
RN assessed pt.'s skin during his visit for lab work.Pt.'s wife had told the nurse that the pt. Has a sore on his buttocks.Pt. has two small spots on each side of his inner buttocks area.The skin is dry with scar tissue.RN advised pt. To keep close look to these area of the skin,and encourage pt. To change positions more often and do not sit down for extensive periods of time.

## 2019-05-10 NOTE — Progress Notes (Addendum)
PCP - DR. [name]. LOV: 04/19/19. Epic CLEARANCE Cardiologist -  Neurologist: Dr. Posey Pronto. LOV: 05/05/19 Chest x-ray - 04/19/19. EPIC  EKG - 04/19/19. EPIC Stress Test -  ECHO -  Cardiac Cath -   Sleep Study -  CPAP -   Fasting Blood Sugar -  Checks Blood Sugar _____ times a day  Blood Thinner Instructions: Aspirin Instructions: Last Dose:  Anesthesia review:   Patient denies shortness of breath, fever, cough and chest pain at PAT appointment   Patient verbalized understanding of instructions that were given to them at the PAT appointment. Patient was also instructed that they will need to review over the PAT instructions again at home before surgery.

## 2019-05-12 NOTE — Progress Notes (Addendum)
Anesthesia Chart Review   Case: [id number] Date/Time: 05/23/19 1130   Procedure: TOTAL HIP ARTHROPLASTY ANTERIOR APPROACH (Left Hip) - 70 mins   Anesthesia type: Spinal   Pre-op diagnosis: Left hip osteoarthritis   Location: [facility] ROOM 10 / [facility]   Surgeons: Paralee Cancel, MD      DISCUSSION:79 y.o. former smoker (former smoker 6 pack years, quit 03/31/71) with h/o HTN, HLD, MVP, DM II, OSA on CPAP, atrial fibrillation, PTSD, left hip OA scheduled for above procedure 05/23/19 with [name].   Pt last seen by PCP 04/23/2019.  Per OV note, "The patient is medically clear for his planned left total hip replacement under spinal anesthesia.  He is perioperative risk is average."  Per 2014 cardiology note, "He notes having an episode of atrial fibrillation in 1992 and was started on Digoxin. No recurrence since then. Echo in 2005 with MVP, mild MR. Cardiac cath 2005 per Dr. Johnsie Cancel with no evidence of CAD."  Discussed with Dr. Gifford Shave. Anticipate pt can proceed with planned procedure barring acute status change.   VS: BP (!) 149/65 (BP Location: Right Arm)   Pulse (!) 55   Temp 36.9 C (Oral)   Resp 18   Ht 5\' 7"  (1.702 m)   Wt 91.2 kg   SpO2 100%   BMI 31.48 kg/m   PROVIDERS: [name], [name], MD is PCP   Lauree Chandler, MD is Cardiologist  LABS: Labs reviewed: Acceptable for surgery. (all labs ordered are listed, but only abnormal results are displayed)  Labs Reviewed  SURGICAL PCR SCREEN  BASIC METABOLIC PANEL  CBC  HEMOGLOBIN A1C  TYPE AND SCREEN     IMAGES:   EKG: 04/19/19 Rate 50 bpm  Sinus bradycardia Otherwise normal ECG   CV: Echo 05/09/12 Study Conclusion:  -Left ventricle: The cavity size was normal.  There was mild focal basal and mild concentric hypertrophy of the septum. Systolic function was normal.  The estimated ejection fraction was in the range of 55-60%.  Wall motion was normal; there was no regional wall motion abnomralities.  Doppler  parameters are consistent with abnormal left ventircular relaxation (grade 1 diastolic dysfunction).  -Mitral valve: Mild regurgitation -Left atrium: the atrium was moderately dilated.  -Right ventricl: the cavity size was moderately dilated.  Systolic function was mildly reduced.  -Right atrium: The atrium was moderately dilated.  -Pulmonary arteries: Systolic pressure waws mildly to moderately increased.  PA peak pressure: 45 mmHg Past Medical History:  Diagnosis Date  . Atrial fibrillation ([place])    1992  . DDD (degenerative disc disease)   . DDD (degenerative disc disease)   . Depression   . Diabetes mellitus    type II  - PT STATES NOT ON ANY DIABETIC MEDICINES  . Dysrhythmia    aFIB  . ED (erectile dysfunction)   . Hyperlipidemia   . Hypertension   . Hypogonadism male   . LBP (low back pain)   . MVP (mitral valve prolapse)   . OSA on CPAP    cpap  . Osteoarthritis   . Osteoarthritis, hand    SEVERE LOWER BACK PAIN, AND RESTLESS LEG SYNDROME  . Palpitations   . PTSD (post-traumatic stress disorder)   . Swallowing problem    HX OF PILLS GETTING "STUCK" IN THROAT AT TIMES  . Vitamin D deficiency     Past Surgical History:  Procedure Laterality Date  . APPENDECTOMY    . BACK SURGERY  1987  . CATARACT EXTRACTION    .  CERVICAL LAMINECTOMY  2004   [name], rod was placed- HAS ROM LIMITATIONS  . CHOLECYSTECTOMY    . COLONOSCOPY    . KNEE SURGERY     left knee/arthroscopic  . TOTAL HIP ARTHROPLASTY Right 03/14/2013   Procedure: RIGHT TOTAL HIP ARTHROPLASTY ANTERIOR APPROACH;  Surgeon: [name], MD;  Location: [facility];  Service: Orthopedics;  Laterality: Right;    MEDICATIONS: . amLODipine (NORVASC) 10 MG tablet  . buPROPion (WELLBUTRIN XL) 150 MG 24 hr tablet  . cefUROXime (CEFTIN) 250 MG tablet  . Cholecalciferol (VITAMIN D3) 1000 UNIT tablet  . ciclopirox (PENLAC) 8 % solution  . clotrimazole-betamethasone (LOTRISONE) cream  . Cyanocobalamin (VITAMIN B-12  CR) 1000 MCG TBCR  . dutasteride (AVODART) 0.5 MG capsule  . FLUoxetine (PROZAC) 40 MG capsule  . gabapentin (NEURONTIN) 300 MG capsule  . ibuprofen (ADVIL) 200 MG tablet  . ketoconazole (NIZORAL) 2 % shampoo  . morphine (MS CONTIN) 30 MG 12 hr tablet  . morphine (MS CONTIN) 30 MG 12 hr tablet  . neomycin-polymyxin-hydrocortisone (CORTISPORIN) OTIC solution  . Polyethyl Glycol-Propyl Glycol (LUBRICANT EYE DROPS) 0.4-0.3 % SOLN  . polyethylene glycol (MIRALAX / GLYCOLAX) packet  . propranolol (INDERAL) 80 MG tablet  . testosterone cypionate (DEPOTESTOSTERONE CYPIONATE) 200 MG/ML injection  . tiZANidine (ZANAFLEX) 4 MG tablet  . traZODone (DESYREL) 50 MG tablet   No current facility-administered medications for this encounter.     Maia Plan [facility] [phone] 05/12/19  2:13 PM

## 2019-05-17 NOTE — Anesthesia Preprocedure Evaluation (Addendum)
Anesthesia Evaluation  Patient identified by MRN, date of birth, ID band Patient awake    Reviewed: Allergy & Precautions, H&P , NPO status , Patient's Chart, lab work & pertinent test results  Airway Mallampati: II   Neck ROM: full    Dental   Pulmonary sleep apnea , former smoker,    breath sounds clear to auscultation       Cardiovascular hypertension, + dysrhythmias  Rhythm:regular Rate:Normal     Neuro/Psych Seizures -,  PSYCHIATRIC DISORDERS Anxiety Depression  Neuromuscular disease    GI/Hepatic   Endo/Other  diabetes, Type 2  Renal/GU      Musculoskeletal  (+) Arthritis ,   Abdominal   Peds  Hematology   Anesthesia Other Findings   Reproductive/Obstetrics                            Anesthesia Physical Anesthesia Plan  ASA: III  Anesthesia Plan: Spinal and MAC   Post-op Pain Management:    Induction: Intravenous  PONV Risk Score and Plan: 1 and Propofol infusion, Ondansetron and Treatment may vary due to age or medical condition  Airway Management Planned: Simple Face Mask  Additional Equipment:   Intra-op Plan:   Post-operative Plan:   Informed Consent: I have reviewed the patients History and Physical, chart, labs and discussed the procedure including the risks, benefits and alternatives for the proposed anesthesia with the patient or authorized representative who has indicated his/her understanding and acceptance.       Plan Discussed with: CRNA, Anesthesiologist and Surgeon  Anesthesia Plan Comments: (See PAT note 05/10/19, Konrad Felix, PA-C)       Anesthesia Quick Evaluation

## 2019-05-19 ENCOUNTER — Other Ambulatory Visit (HOSPITAL_COMMUNITY)
Admission: RE | Admit: 2019-05-19 | Discharge: 2019-05-19 | Disposition: A | Discharge status: Home / Self Care | Payer: PPO | Source: Ambulatory Visit | Attending: Orthopedic Surgery | Admitting: Orthopedic Surgery

## 2019-05-19 DIAGNOSIS — Z01812 Encounter for preprocedural laboratory examination: Secondary | ICD-10-CM | POA: Diagnosis not present

## 2019-05-19 DIAGNOSIS — Z20822 Contact with and (suspected) exposure to covid-19: Secondary | ICD-10-CM | POA: Insufficient documentation

## 2019-05-19 LAB — SARS CORONAVIRUS 2 (TAT 6-24 HRS): SARS Coronavirus 2: NEGATIVE

## 2019-05-22 NOTE — H&P (Signed)
TOTAL HIP ADMISSION H&P  Patient is admitted for left total hip arthroplasty.  Subjective:  Chief Complaint: left hip pain  HPI: Vernon Bishop, 79 y.o. male, has a history of pain and functional disability in the left hip(s) due to arthritis and patient has failed non-surgical conservative treatments for greater than 12 weeks to include activity modification.  Onset of symptoms was gradual starting 2 years ago with gradually worsening course since that time.The patient noted no past surgery on the left hip(s).  Patient currently rates pain in the left hip at 8 out of 10 with activity. Patient has worsening of pain with activity and weight bearing and pain that interfers with activities of daily living. Patient has evidence of joint space narrowing by imaging studies. This condition presents safety issues increasing the risk of falls.  There is no current active infection.  Patient Active Problem List   Diagnosis Date Noted  . Ear pain, right 04/05/2019  . Leg weakness, bilateral 02/01/2019  . Arm weakness 04/07/2018  . BPH (benign prostatic hyperplasia) 04/23/2017  . Hip pain, chronic, left 05/06/2016  . Delusional thoughts ([place]) 05/06/2016  . CTS (carpal tunnel syndrome) 07/29/2015  . Ulnar neuropathy 07/29/2015  . Generalized anxiety disorder 07/29/2015  . Preop exam for internal medicine 01/28/2015  . Insomnia 04/13/2014  . Left knee pain 10/10/2013  . Delusional disorder ([place]) 10/10/2013  . Overweight (BMI 25.0-29.9) 03/15/2013  . S/P total hip arthroplasty 03/14/2013  . Osteoarthritis of right hip 03/06/2013  . Bradycardia 10/09/2012  . Sinusitis, acute 06/29/2012  . Actinic keratoses 05/24/2012  . Hyponatremia 10/23/2011  . Edema 10/23/2011  . Shoulder pain, acute 07/13/2011  . Arthralgia 06/08/2011  . Cervical pain 01/28/2011  . Neoplasm of uncertain behavior of skin 06/16/2010  . BRADYCARDIA 03/17/2010  . SINUSITIS, ACUTE 12/11/2009  . PAIN IN THORACIC SPINE  04/26/2009  . TOBACCO USE, QUIT 02/19/2009  . OSA on CPAP 11/15/2008  . Hypogonadism male 05/02/2008  . Vitamin B12 deficiency 08/26/2007  . VISUAL CHANGES 08/25/2007  . OTHER CONVULSIONS 08/25/2007  . SLEEP DEPRIVATION 08/25/2007  . PALPITATIONS 08/25/2007  . HIP PAIN 04/29/2007  . Osteoarthritis 02/15/2007  . Dyslipidemia 02/14/2007  . Depression 02/14/2007  . Atrial fibrillation ([place]) 02/14/2007  . Seborrheic dermatitis 02/14/2007  . Diabetes mellitus type 2, diet-controlled ([place]) 01/11/2007  . Essential hypertension 01/11/2007  . LOW BACK PAIN 01/11/2007   Past Medical History:  Diagnosis Date  . Atrial fibrillation ([place])    1992  . DDD (degenerative disc disease)   . DDD (degenerative disc disease)   . Depression   . Diabetes mellitus    type II  - PT STATES NOT ON ANY DIABETIC MEDICINES  . Dysrhythmia    aFIB  . ED (erectile dysfunction)   . Hyperlipidemia   . Hypertension   . Hypogonadism male   . LBP (low back pain)   . MVP (mitral valve prolapse)   . OSA on CPAP    cpap  . Osteoarthritis   . Osteoarthritis, hand    SEVERE LOWER BACK PAIN, AND RESTLESS LEG SYNDROME  . Palpitations   . PTSD (post-traumatic stress disorder)   . Swallowing problem    HX OF PILLS GETTING "STUCK" IN THROAT AT TIMES  . Vitamin D deficiency     Past Surgical History:  Procedure Laterality Date  . APPENDECTOMY    . BACK SURGERY  1987  . CATARACT EXTRACTION    . CERVICAL LAMINECTOMY  2004   [name],  rod was placed- HAS ROM LIMITATIONS  . CHOLECYSTECTOMY    . COLONOSCOPY    . KNEE SURGERY     left knee/arthroscopic  . TOTAL HIP ARTHROPLASTY Right 03/14/2013   Procedure: RIGHT TOTAL HIP ARTHROPLASTY ANTERIOR APPROACH;  Surgeon: [name], MD;  Location[facility];  Service: Orthopedics;  Laterality: Right;    No current facility-administered medications for this encounter.   Current Outpatient Medications  Medication Sig Dispense Refill Last Dose  . amLODipine (NORVASC)  10 MG tablet TAKE 1 TABLET BY MOUTH EVERY MORNING. (Patient taking differently: Take 10 mg by mouth daily. ) 90 tablet 3   . buPROPion (WELLBUTRIN XL) 150 MG 24 hr tablet TAKE 3 TABLETS BY MOUTH EVERY MORNING (Patient taking differently: Take 450 mg by mouth daily. ) 90 tablet 3   . ciclopirox (PENLAC) 8 % solution Apply topically at bedtime. Apply over nail and surrounding skin. Apply daily over previous coat. After seven (7) days, may remove with alcohol and continue cycle. (Patient taking differently: Apply 1 application topically See admin instructions. Apply over nail and surrounding skin. Apply daily over previous coat. After seven (7) days, may remove with alcohol and continue cycle.) 6.6 mL 1   . clotrimazole-betamethasone (LOTRISONE) cream APPLY TO AFFECTED AREA 2 TIMES A DAY. (Patient taking differently: Apply 1 application topically 2 (two) times daily as needed (skin irritation). ) 45 g 1   . dutasteride (AVODART) 0.5 MG capsule TAKE 1 CAPSULE BY MOUTH DAILY. (Patient taking differently: Take 0.5 mg by mouth daily. ) 30 capsule 11   . FLUoxetine (PROZAC) 40 MG capsule Take 1 capsule (40 mg total) by mouth daily. 90 capsule 3   . gabapentin (NEURONTIN) 300 MG capsule TAKE 1 CAPSULE BY MOUTH THREE TIMES A DAY (Patient taking differently: Take 300 mg by mouth 3 (three) times daily. ) 90 capsule 11   . ibuprofen (ADVIL) 200 MG tablet Take 600 mg by mouth every 8 (eight) hours as needed (pain.).     Marland Kitchen ketoconazole (NIZORAL) 2 % shampoo APPLY AS DIRECTED 2 TIMES A WEEK (Patient taking differently: Apply 1 application topically daily as needed (scalp irritation/dandruff). ) 120 mL 0   . morphine (MS CONTIN) 30 MG 12 hr tablet Take 1 tablet (30 mg total) by mouth every 12 (twelve) hours. 60 tablet 0   . Polyethyl Glycol-Propyl Glycol (LUBRICANT EYE DROPS) 0.4-0.3 % SOLN Place 1 drop into both eyes 3 (three) times daily as needed (dry/irritated eyes.).     Marland Kitchen polyethylene glycol (MIRALAX / GLYCOLAX)  packet Take 17 g by mouth 2 (two) times daily. (Patient taking differently: Take 17 g by mouth every other day as needed (constipation.). ) 14 each 0   . propranolol (INDERAL) 80 MG tablet TAKE 1 TABLET BY MOUTH 3 TIMES DAILY. (Patient taking differently: Take 80 mg by mouth 3 (three) times daily. ) 270 tablet 3   . testosterone cypionate (DEPOTESTOSTERONE CYPIONATE) 200 MG/ML injection INJECT 3 MILLILITERS INTO THE MUSCLE EVERY 14 DAYS (Patient taking differently: Inject 600 mg into the muscle every 14 (fourteen) days. ) 10 mL 5   . tiZANidine (ZANAFLEX) 4 MG tablet TAKE 1 TABLET BY MOUTH EVERY 8 HOURS AS NEEDED FOR MUSCLE SPASMS. (Patient taking differently: Take 4 mg by mouth every 8 (eight) hours as needed (spasms). TAKE 1 TABLET BY MOUTH EVERY 8 HOURS AS NEEDED FOR MUSCLE SPASMS.) 60 tablet 1   . traZODone (DESYREL) 50 MG tablet TAKE 1 TO 2 TABLETS BY MOUTH  AT BEDTIME. (Patient taking differently: Take 50-100 mg by mouth at bedtime as needed for sleep. ) 60 tablet 5   . cefUROXime (CEFTIN) 250 MG tablet Take 1 tablet (250 mg total) by mouth 2 (two) times daily. (Patient not taking: Reported on 05/08/2019) 20 tablet 0 Not Taking at Unknown time  . Cholecalciferol (VITAMIN D3) 1000 UNIT tablet Take 1,000 Units by mouth daily.       . Cyanocobalamin (VITAMIN B-12 CR) 1000 MCG TBCR Take 1,000 mcg by mouth daily.       Marland Kitchen morphine (MS CONTIN) 30 MG 12 hr tablet Take 1 tablet (30 mg total) by mouth every 12 (twelve) hours. (Patient not taking: Reported on 05/08/2019) 60 tablet 0 Not Taking at Unknown time  . neomycin-polymyxin-hydrocortisone (CORTISPORIN) OTIC solution Place 3 drops into the right ear 3 (three) times daily. (Patient not taking: Reported on 05/08/2019) 10 mL 3 Not Taking at Unknown time   Allergies  Allergen Reactions  . Abilify [Aripiprazole]     SOB feeling  . Cymbalta [Duloxetine Hcl]     constipation  . Digoxin And Related     HR 41  . Duloxetine     REACTION: constipation  .  Prednisolone     Edema, wt gain  . Temazepam     Social History   Tobacco Use  . Smoking status: Former Smoker    Packs/day: 0.50    Years: 12.00    Pack years: 6.00    Types: Cigarettes    Quit date: 03/31/1971    Years since quitting: 48.1  . Smokeless tobacco: Never Used  . Tobacco comment: Started at age 66  Substance Use Topics  . Alcohol use: Yes    Alcohol/week: 2.0 standard drinks    Types: 2 Cans of beer per week    Comment: occasionally    Family History  Problem Relation Age of Onset  . Allergies Mother   . Stroke Mother        Clotting disorders  . Heart disease Mother   . Heart disease Father   . Heart attack Father 7  . Heart disease Brother   . Rheum arthritis Paternal Grandmother   . Other Paternal Grandmother        Rheumatism  . Coronary artery disease Other   . Diabetes Other   . Hypertension Other   . Esophageal cancer Maternal Aunt   . Colon cancer Neg Hx   . Rectal cancer Neg Hx   . Stomach cancer Neg Hx      Review of Systems  Constitutional: Negative for chills and fever.  Respiratory: Negative for cough and shortness of breath.   Gastrointestinal: Negative for nausea and vomiting.  Musculoskeletal: Positive for arthralgias.    Objective:  Physical Exam Patient is a 79 year old male.  Well nourished and well developed. General: Alert and oriented x3, cooperative and pleasant, no acute distress. Head: normocephalic, atraumatic, neck supple. Eyes: EOMI. Respiratory: breath sounds clear in all fields, no wheezing, rales, or rhonchi. Cardiovascular: Regular rate and rhythm, no murmurs, gallops or rubs. Abdomen: non-tender to palpation and soft, normoactive bowel sounds.  Musculoskeletal: Left hip exam: Pain with hip flexion internal rotation of 5 with pelvic tilting, external rotation to 20 with less pain Weakness and discomfort with active hip flexion and abduction  Calves soft and nontender. Motor function intact in LE.  Strength 5/5 LE bilaterally. Neuro: Distal pulses 2+. Sensation to light touch intact in LE.  Vital signs in last 24  hours:   Labs:  Estimated body mass index is 31.48 kg/m as calculated from the following:   Height as of 05/10/19: 5\' 7"  (1.702 m).   Weight as of 05/10/19: 91.2 kg.   Imaging Review Plain radiographs demonstrate severe degenerative joint disease of the left hip(s). The bone quality appears to be adequate for age and reported activity level.  Assessment/Plan:  End stage arthritis, left hip(s)  The patient history, physical examination, clinical judgement of the provider and imaging studies are consistent with end stage degenerative joint disease of the left hip(s) and total hip arthroplasty is deemed medically necessary. The treatment options including medical management, injection therapy, arthroscopy and arthroplasty were discussed at length. The risks and benefits of total hip arthroplasty were presented and reviewed. The risks due to aseptic loosening, infection, stiffness, dislocation/subluxation,  thromboembolic complications and other imponderables were discussed.  The patient acknowledged the explanation, agreed to proceed with the plan and consent was signed. Patient is being admitted for inpatient treatment for surgery, pain control, PT, OT, prophylactic antibiotics, VTE prophylaxis, progressive ambulation and ADL's and discharge planning.The patient is planning to be discharged home.  Therapy Plans: HEP Disposition: Home with wife Planned DVT Prophylaxis: aspirin 81mg  BID DME needed: none PCP: Dr. Alain Marion, clearance received TXA: IV Allergies: NKDA Anesthesia Concerns: none BMI: 27.8 Last HgbA1c: 4.9%, not diabetic  Other: On morphine 30 mg BID for chronic back pain. May want HHPT.  - Patient was instructed on what medications to stop prior to surgery. - Follow-up visit in 2 weeks with [name] - Begin physical therapy following surgery - Pre-operative  lab work as pre-surgical testing - Prescriptions will be provided in hospital at time of discharge  Griffith Citron, PA-C Orthopedic Surgery [facility] [place] [phone]

## 2019-05-23 ENCOUNTER — Observation Stay (HOSPITAL_COMMUNITY)
Admission: RE | Admit: 2019-05-23 | Discharge: 2019-05-24 | Disposition: A | Discharge status: Home / Self Care | Payer: PPO | Source: Other Acute Inpatient Hospital | Attending: Orthopedic Surgery | Admitting: Orthopedic Surgery

## 2019-05-23 ENCOUNTER — Ambulatory Visit (HOSPITAL_COMMUNITY): Payer: PPO | Admitting: Anesthesiology

## 2019-05-23 ENCOUNTER — Ambulatory Visit (HOSPITAL_COMMUNITY): Payer: PPO | Admitting: Physician Assistant

## 2019-05-23 ENCOUNTER — Ambulatory Visit (HOSPITAL_COMMUNITY): Payer: PPO

## 2019-05-23 ENCOUNTER — Encounter (HOSPITAL_COMMUNITY)
Admission: RE | Disposition: A | Discharge status: Home / Self Care | Payer: Self-pay | Source: Other Acute Inpatient Hospital | Attending: Orthopedic Surgery

## 2019-05-23 ENCOUNTER — Observation Stay (HOSPITAL_COMMUNITY): Payer: PPO

## 2019-05-23 ENCOUNTER — Encounter (HOSPITAL_COMMUNITY): Payer: Self-pay | Admitting: Orthopedic Surgery

## 2019-05-23 ENCOUNTER — Other Ambulatory Visit: Payer: Self-pay

## 2019-05-23 DIAGNOSIS — I10 Essential (primary) hypertension: Secondary | ICD-10-CM | POA: Diagnosis not present

## 2019-05-23 DIAGNOSIS — G4730 Sleep apnea, unspecified: Secondary | ICD-10-CM | POA: Insufficient documentation

## 2019-05-23 DIAGNOSIS — E119 Type 2 diabetes mellitus without complications: Secondary | ICD-10-CM | POA: Diagnosis not present

## 2019-05-23 DIAGNOSIS — F329 Major depressive disorder, single episode, unspecified: Secondary | ICD-10-CM | POA: Diagnosis not present

## 2019-05-23 DIAGNOSIS — M25552 Pain in left hip: Secondary | ICD-10-CM | POA: Diagnosis present

## 2019-05-23 DIAGNOSIS — N400 Enlarged prostate without lower urinary tract symptoms: Secondary | ICD-10-CM | POA: Insufficient documentation

## 2019-05-23 DIAGNOSIS — Z87891 Personal history of nicotine dependence: Secondary | ICD-10-CM | POA: Insufficient documentation

## 2019-05-23 DIAGNOSIS — Z96651 Presence of right artificial knee joint: Secondary | ICD-10-CM | POA: Insufficient documentation

## 2019-05-23 DIAGNOSIS — E559 Vitamin D deficiency, unspecified: Secondary | ICD-10-CM | POA: Insufficient documentation

## 2019-05-23 DIAGNOSIS — Z79891 Long term (current) use of opiate analgesic: Secondary | ICD-10-CM | POA: Diagnosis not present

## 2019-05-23 DIAGNOSIS — Z471 Aftercare following joint replacement surgery: Secondary | ICD-10-CM | POA: Diagnosis not present

## 2019-05-23 DIAGNOSIS — Z96642 Presence of left artificial hip joint: Secondary | ICD-10-CM | POA: Diagnosis not present

## 2019-05-23 DIAGNOSIS — Z419 Encounter for procedure for purposes other than remedying health state, unspecified: Secondary | ICD-10-CM

## 2019-05-23 DIAGNOSIS — Z96649 Presence of unspecified artificial hip joint: Secondary | ICD-10-CM

## 2019-05-23 DIAGNOSIS — G4733 Obstructive sleep apnea (adult) (pediatric): Secondary | ICD-10-CM | POA: Insufficient documentation

## 2019-05-23 DIAGNOSIS — Z79899 Other long term (current) drug therapy: Secondary | ICD-10-CM | POA: Diagnosis not present

## 2019-05-23 DIAGNOSIS — F418 Other specified anxiety disorders: Secondary | ICD-10-CM | POA: Diagnosis not present

## 2019-05-23 DIAGNOSIS — M1612 Unilateral primary osteoarthritis, left hip: Secondary | ICD-10-CM | POA: Diagnosis not present

## 2019-05-23 DIAGNOSIS — G4700 Insomnia, unspecified: Secondary | ICD-10-CM | POA: Diagnosis not present

## 2019-05-23 HISTORY — PX: TOTAL HIP ARTHROPLASTY: SHX124

## 2019-05-23 LAB — GLUCOSE, CAPILLARY
Glucose-Capillary: 83 mg/dL (ref 70–99)
Glucose-Capillary: 81 mg/dL (ref 70–99)

## 2019-05-23 LAB — TYPE AND SCREEN
ABO/RH(D): O NEG
Antibody Screen: NEGATIVE

## 2019-05-23 SURGERY — ARTHROPLASTY, HIP, TOTAL, ANTERIOR APPROACH
Anesthesia: Monitor Anesthesia Care | Site: Hip | Laterality: Left

## 2019-05-23 MED ORDER — METHOCARBAMOL 500 MG IVPB - SIMPLE MED
500.0000 mg | Freq: Four times a day (QID) | INTRAVENOUS | Status: DC | PRN
  Filled 2019-05-23: qty 50

## 2019-05-23 MED ORDER — FENTANYL CITRATE (PF) 100 MCG/2ML IJ SOLN
INTRAMUSCULAR | Status: AC
  Filled 2019-05-23: qty 2

## 2019-05-23 MED ORDER — ALUM & MAG HYDROXIDE-SIMETH 200-200-20 MG/5ML PO SUSP: 15.0000 mL | ORAL | Status: DC | PRN

## 2019-05-23 MED ORDER — FLUOXETINE HCL 20 MG PO CAPS
40.0000 mg | ORAL_CAPSULE | Freq: Every day | ORAL | Status: DC
  Administered 2019-05-24: 40 mg via ORAL
  Filled 2019-05-23: qty 2

## 2019-05-23 MED ORDER — ACETAMINOPHEN 325 MG PO TABS: 325.0000 mg | ORAL_TABLET | Freq: Four times a day (QID) | ORAL | Status: DC | PRN

## 2019-05-23 MED ORDER — BISACODYL 10 MG RE SUPP: 10.0000 mg | Freq: Every day | RECTAL | Status: DC | PRN

## 2019-05-23 MED ORDER — PROPOFOL 500 MG/50ML IV EMUL
INTRAVENOUS | Status: DC | PRN
  Administered 2019-05-23: 75 ug/kg/min via INTRAVENOUS

## 2019-05-23 MED ORDER — STERILE WATER FOR IRRIGATION IR SOLN
Status: DC | PRN
  Administered 2019-05-23: 2000 mL

## 2019-05-23 MED ORDER — DEXAMETHASONE SODIUM PHOSPHATE 10 MG/ML IJ SOLN
10.0000 mg | Freq: Once | INTRAMUSCULAR | Status: AC
  Administered 2019-05-24: 10 mg via INTRAVENOUS
  Filled 2019-05-23: qty 1

## 2019-05-23 MED ORDER — EPHEDRINE SULFATE-NACL 50-0.9 MG/10ML-% IV SOSY
PREFILLED_SYRINGE | INTRAVENOUS | Status: DC | PRN
  Administered 2019-05-23 (×2): 10 mg via INTRAVENOUS

## 2019-05-23 MED ORDER — BUPROPION HCL ER (XL) 300 MG PO TB24
450.0000 mg | ORAL_TABLET | Freq: Every day | ORAL | Status: DC
  Administered 2019-05-24: 450 mg via ORAL
  Filled 2019-05-23: qty 1

## 2019-05-23 MED ORDER — HYDROCODONE-ACETAMINOPHEN 5-325 MG PO TABS
1.0000 | ORAL_TABLET | ORAL | Status: DC | PRN
  Administered 2019-05-23 – 2019-05-24 (×2): 2 via ORAL
  Filled 2019-05-23 (×2): qty 2

## 2019-05-23 MED ORDER — METHOCARBAMOL 500 MG PO TABS: 500.0000 mg | ORAL_TABLET | Freq: Four times a day (QID) | ORAL | Status: DC | PRN

## 2019-05-23 MED ORDER — LACTATED RINGERS IV BOLUS
500.0000 mL | Freq: Once | INTRAVENOUS | Status: AC
  Administered 2019-05-23: 500 mL via INTRAVENOUS

## 2019-05-23 MED ORDER — TRANEXAMIC ACID-NACL 1000-0.7 MG/100ML-% IV SOLN
1000.0000 mg | INTRAVENOUS | Status: AC
  Administered 2019-05-23: 12:00:00 1000 mg via INTRAVENOUS
  Filled 2019-05-23: qty 100

## 2019-05-23 MED ORDER — ASPIRIN EC 81 MG PO TBEC
81.0000 mg | DELAYED_RELEASE_TABLET | Freq: Two times a day (BID) | ORAL | Status: DC
  Administered 2019-05-24: 81 mg via ORAL
  Filled 2019-05-23: qty 1

## 2019-05-23 MED ORDER — MORPHINE SULFATE ER 30 MG PO TBCR
30.0000 mg | EXTENDED_RELEASE_TABLET | Freq: Two times a day (BID) | ORAL | Status: DC
  Administered 2019-05-23 – 2019-05-24 (×2): 30 mg via ORAL
  Filled 2019-05-23 (×2): qty 1

## 2019-05-23 MED ORDER — ONDANSETRON HCL 4 MG/2ML IJ SOLN
INTRAMUSCULAR | Status: DC | PRN
  Administered 2019-05-23: 4 mg via INTRAVENOUS

## 2019-05-23 MED ORDER — DEXAMETHASONE SODIUM PHOSPHATE 10 MG/ML IJ SOLN
INTRAMUSCULAR | Status: DC | PRN
  Administered 2019-05-23: 10 mg via INTRAVENOUS

## 2019-05-23 MED ORDER — HYDROMORPHONE HCL 1 MG/ML IJ SOLN: 0.5000 mg | INTRAMUSCULAR | Status: DC | PRN

## 2019-05-23 MED ORDER — ONDANSETRON HCL 4 MG/2ML IJ SOLN: 4.0000 mg | Freq: Four times a day (QID) | INTRAMUSCULAR | Status: DC | PRN

## 2019-05-23 MED ORDER — PROPOFOL 1000 MG/100ML IV EMUL
INTRAVENOUS | Status: AC
  Filled 2019-05-23: qty 100

## 2019-05-23 MED ORDER — MENTHOL 3 MG MT LOZG: 1.0000 | LOZENGE | OROMUCOSAL | Status: DC | PRN

## 2019-05-23 MED ORDER — DUTASTERIDE 0.5 MG PO CAPS
0.5000 mg | ORAL_CAPSULE | Freq: Every day | ORAL | Status: DC
  Administered 2019-05-24: 0.5 mg via ORAL
  Filled 2019-05-23: qty 1

## 2019-05-23 MED ORDER — FERROUS SULFATE 325 (65 FE) MG PO TABS
325.0000 mg | ORAL_TABLET | Freq: Three times a day (TID) | ORAL | Status: DC
  Administered 2019-05-23 – 2019-05-24 (×2): 325 mg via ORAL
  Filled 2019-05-23 (×2): qty 1

## 2019-05-23 MED ORDER — DIPHENHYDRAMINE HCL 12.5 MG/5ML PO ELIX: 12.5000 mg | ORAL_SOLUTION | ORAL | Status: DC | PRN

## 2019-05-23 MED ORDER — ALBUMIN HUMAN 5 % IV SOLN: INTRAVENOUS | Status: DC | PRN

## 2019-05-23 MED ORDER — MAGNESIUM CITRATE PO SOLN: 1.0000 | Freq: Once | ORAL | Status: DC | PRN

## 2019-05-23 MED ORDER — ONDANSETRON HCL 4 MG PO TABS: 4.0000 mg | ORAL_TABLET | Freq: Four times a day (QID) | ORAL | Status: DC | PRN

## 2019-05-23 MED ORDER — EPHEDRINE 5 MG/ML INJ
INTRAVENOUS | Status: AC
  Filled 2019-05-23: qty 10

## 2019-05-23 MED ORDER — DEXAMETHASONE SODIUM PHOSPHATE 10 MG/ML IJ SOLN: 10.0000 mg | Freq: Once | INTRAMUSCULAR | Status: AC

## 2019-05-23 MED ORDER — CEFAZOLIN SODIUM-DEXTROSE 2-4 GM/100ML-% IV SOLN
2.0000 g | Freq: Four times a day (QID) | INTRAVENOUS | Status: AC
  Administered 2019-05-23 (×2): 2 g via INTRAVENOUS
  Filled 2019-05-23 (×2): qty 100

## 2019-05-23 MED ORDER — TRAZODONE HCL 50 MG PO TABS: 50.0000 mg | ORAL_TABLET | Freq: Every evening | ORAL | Status: DC | PRN

## 2019-05-23 MED ORDER — FENTANYL CITRATE (PF) 100 MCG/2ML IJ SOLN
INTRAMUSCULAR | Status: DC | PRN
  Administered 2019-05-23: 100 ug via INTRAVENOUS

## 2019-05-23 MED ORDER — HYDROCODONE-ACETAMINOPHEN 7.5-325 MG PO TABS: 1.0000 | ORAL_TABLET | ORAL | Status: DC | PRN

## 2019-05-23 MED ORDER — ALBUMIN HUMAN 5 % IV SOLN
INTRAVENOUS | Status: AC
  Filled 2019-05-23: qty 250

## 2019-05-23 MED ORDER — OXYCODONE HCL 5 MG/5ML PO SOLN: 5.0000 mg | Freq: Once | ORAL | Status: DC | PRN

## 2019-05-23 MED ORDER — ONDANSETRON HCL 4 MG/2ML IJ SOLN
INTRAMUSCULAR | Status: AC
  Filled 2019-05-23: qty 2

## 2019-05-23 MED ORDER — MIDAZOLAM HCL 5 MG/5ML IJ SOLN
INTRAMUSCULAR | Status: DC | PRN
  Administered 2019-05-23: 2 mg via INTRAVENOUS

## 2019-05-23 MED ORDER — DOCUSATE SODIUM 100 MG PO CAPS
100.0000 mg | ORAL_CAPSULE | Freq: Two times a day (BID) | ORAL | Status: DC
  Administered 2019-05-23 – 2019-05-24 (×2): 100 mg via ORAL
  Filled 2019-05-23 (×2): qty 1

## 2019-05-23 MED ORDER — CEFAZOLIN SODIUM-DEXTROSE 2-4 GM/100ML-% IV SOLN
2.0000 g | INTRAVENOUS | Status: AC
  Administered 2019-05-23: 11:00:00 2 g via INTRAVENOUS
  Filled 2019-05-23: qty 100

## 2019-05-23 MED ORDER — PHENOL 1.4 % MT LIQD: 1.0000 | OROMUCOSAL | Status: DC | PRN

## 2019-05-23 MED ORDER — SODIUM CHLORIDE 0.9 % IV SOLN: INTRAVENOUS | Status: DC

## 2019-05-23 MED ORDER — BUPIVACAINE IN DEXTROSE 0.75-8.25 % IT SOLN
INTRATHECAL | Status: DC | PRN
  Administered 2019-05-23: 1.8 mL via INTRATHECAL

## 2019-05-23 MED ORDER — METOCLOPRAMIDE HCL 5 MG PO TABS: 5.0000 mg | ORAL_TABLET | Freq: Three times a day (TID) | ORAL | Status: DC | PRN

## 2019-05-23 MED ORDER — CHLORHEXIDINE GLUCONATE 4 % EX LIQD: 60.0000 mL | Freq: Once | CUTANEOUS | Status: DC

## 2019-05-23 MED ORDER — TRANEXAMIC ACID-NACL 1000-0.7 MG/100ML-% IV SOLN
1000.0000 mg | Freq: Once | INTRAVENOUS | Status: AC
  Administered 2019-05-23: 1000 mg via INTRAVENOUS
  Filled 2019-05-23: qty 100

## 2019-05-23 MED ORDER — CELECOXIB 200 MG PO CAPS
200.0000 mg | ORAL_CAPSULE | Freq: Two times a day (BID) | ORAL | Status: DC
  Administered 2019-05-23 – 2019-05-24 (×2): 200 mg via ORAL
  Filled 2019-05-23 (×2): qty 1

## 2019-05-23 MED ORDER — MIDAZOLAM HCL 2 MG/2ML IJ SOLN
INTRAMUSCULAR | Status: AC
  Filled 2019-05-23: qty 2

## 2019-05-23 MED ORDER — PROPOFOL 500 MG/50ML IV EMUL
INTRAVENOUS | Status: AC
  Filled 2019-05-23: qty 50

## 2019-05-23 MED ORDER — PROPOFOL 10 MG/ML IV BOLUS
INTRAVENOUS | Status: DC | PRN
  Administered 2019-05-23: 20 mg via INTRAVENOUS

## 2019-05-23 MED ORDER — GABAPENTIN 300 MG PO CAPS
300.0000 mg | ORAL_CAPSULE | Freq: Three times a day (TID) | ORAL | Status: DC
  Administered 2019-05-23 – 2019-05-24 (×3): 300 mg via ORAL
  Filled 2019-05-23 (×3): qty 1

## 2019-05-23 MED ORDER — POLYETHYLENE GLYCOL 3350 17 G PO PACK
17.0000 g | PACK | Freq: Two times a day (BID) | ORAL | Status: DC
  Administered 2019-05-23 – 2019-05-24 (×2): 17 g via ORAL
  Filled 2019-05-23 (×2): qty 1

## 2019-05-23 MED ORDER — DEXAMETHASONE SODIUM PHOSPHATE 10 MG/ML IJ SOLN
INTRAMUSCULAR | Status: AC
  Filled 2019-05-23: qty 1

## 2019-05-23 MED ORDER — METOCLOPRAMIDE HCL 5 MG/ML IJ SOLN: 5.0000 mg | Freq: Three times a day (TID) | INTRAMUSCULAR | Status: DC | PRN

## 2019-05-23 MED ORDER — PHENYLEPHRINE HCL-NACL 10-0.9 MG/250ML-% IV SOLN
INTRAVENOUS | Status: DC | PRN
  Administered 2019-05-23: 40 ug/min via INTRAVENOUS

## 2019-05-23 MED ORDER — SODIUM CHLORIDE 0.9 % IR SOLN
Status: DC | PRN
  Administered 2019-05-23: 1000 mL

## 2019-05-23 MED ORDER — FENTANYL CITRATE (PF) 100 MCG/2ML IJ SOLN
25.0000 ug | INTRAMUSCULAR | Status: DC | PRN
  Administered 2019-05-23: 50 ug via INTRAVENOUS
  Administered 2019-05-23 (×2): 25 ug via INTRAVENOUS

## 2019-05-23 MED ORDER — OXYCODONE HCL 5 MG PO TABS: 5.0000 mg | ORAL_TABLET | Freq: Once | ORAL | Status: DC | PRN

## 2019-05-23 MED ORDER — AMLODIPINE BESYLATE 10 MG PO TABS
10.0000 mg | ORAL_TABLET | Freq: Every day | ORAL | Status: DC
  Administered 2019-05-24: 10 mg via ORAL
  Filled 2019-05-23: qty 1

## 2019-05-23 MED ORDER — LACTATED RINGERS IV SOLN: INTRAVENOUS | Status: DC

## 2019-05-23 MED ORDER — PHENYLEPHRINE HCL (PRESSORS) 10 MG/ML IV SOLN
INTRAVENOUS | Status: AC
  Filled 2019-05-23: qty 1

## 2019-05-23 MED ORDER — PROPRANOLOL HCL 80 MG PO TABS
80.0000 mg | ORAL_TABLET | Freq: Three times a day (TID) | ORAL | Status: DC
  Administered 2019-05-23 – 2019-05-24 (×2): 80 mg via ORAL
  Filled 2019-05-23 (×3): qty 1

## 2019-05-23 SURGICAL SUPPLY — 51 items
ADH SKN CLS APL DERMABOND .7 (GAUZE/BANDAGES/DRESSINGS) ×1
ARTICULEZE HEAD (Hips) ×3 IMPLANT
BAG DECANTER FOR FLEXI CONT (MISCELLANEOUS) IMPLANT
BAG SPEC THK2 15X12 ZIP CLS (MISCELLANEOUS)
BAG ZIPLOCK 12X15 (MISCELLANEOUS) IMPLANT
BLADE SAG 18X100X1.27 (BLADE) ×3 IMPLANT
BLADE SURG SZ10 CARB STEEL (BLADE) ×6 IMPLANT
COVER PERINEAL POST (MISCELLANEOUS) ×3 IMPLANT
COVER SURGICAL LIGHT HANDLE (MISCELLANEOUS) ×3 IMPLANT
COVER WAND RF STERILE (DRAPES) IMPLANT
CUP ACET PINNACLE SECTR 58MM (Hips) IMPLANT
DERMABOND ADVANCED (GAUZE/BANDAGES/DRESSINGS) ×2
DERMABOND ADVANCED .7 DNX12 (GAUZE/BANDAGES/DRESSINGS) ×1 IMPLANT
DRAPE STERI IOBAN 125X83 (DRAPES) ×3 IMPLANT
DRAPE U-SHAPE 47X51 STRL (DRAPES) ×6 IMPLANT
DRESSING AQUACEL AG SP 3.5X10 (GAUZE/BANDAGES/DRESSINGS) ×1 IMPLANT
DRSG AQUACEL AG ADV 3.5X10 (GAUZE/BANDAGES/DRESSINGS) ×2 IMPLANT
DRSG AQUACEL AG SP 3.5X10 (GAUZE/BANDAGES/DRESSINGS) ×3
DURAPREP 26ML APPLICATOR (WOUND CARE) ×3 IMPLANT
ELECT BLADE TIP CTD 4 INCH (ELECTRODE) ×3 IMPLANT
ELECT REM PT RETURN 15FT ADLT (MISCELLANEOUS) ×3 IMPLANT
ELIMINATOR HOLE APEX DEPUY (Hips) ×2 IMPLANT
GLOVE BIO SURGEON STRL SZ 6 (GLOVE) ×6 IMPLANT
GLOVE BIOGEL PI IND STRL 6.5 (GLOVE) ×1 IMPLANT
GLOVE BIOGEL PI IND STRL 7.5 (GLOVE) ×1 IMPLANT
GLOVE BIOGEL PI IND STRL 8.5 (GLOVE) ×1 IMPLANT
GLOVE BIOGEL PI INDICATOR 6.5 (GLOVE) ×2
GLOVE BIOGEL PI INDICATOR 7.5 (GLOVE) ×2
GLOVE BIOGEL PI INDICATOR 8.5 (GLOVE) ×2
GLOVE ECLIPSE 8.0 STRL XLNG CF (GLOVE) ×6 IMPLANT
GLOVE ORTHO TXT STRL SZ7.5 (GLOVE) ×6 IMPLANT
GOWN STRL REUS W/TWL LRG LVL3 (GOWN DISPOSABLE) ×6 IMPLANT
GOWN STRL REUS W/TWL XL LVL3 (GOWN DISPOSABLE) ×3 IMPLANT
HEAD ARTICULEZE (Hips) IMPLANT
HOLDER FOLEY CATH W/STRAP (MISCELLANEOUS) ×3 IMPLANT
KIT TURNOVER KIT A (KITS) IMPLANT
LINER NEUTRAL 36X58 PLUS4 ×2 IMPLANT
PACK ANTERIOR HIP CUSTOM (KITS) ×3 IMPLANT
PENCIL SMOKE EVACUATOR (MISCELLANEOUS) IMPLANT
PINNACLE SECTOR CUP 58MM (Hips) ×3 IMPLANT
SCREW 6.5MMX25MM (Screw) ×2 IMPLANT
STEM FEM ACTIS HIGH SZ7 (Stem) ×2 IMPLANT
SUT MNCRL AB 4-0 PS2 18 (SUTURE) ×3 IMPLANT
SUT STRATAFIX 0 PDS 27 VIOLET (SUTURE) ×3
SUT VIC AB 1 CT1 36 (SUTURE) ×9 IMPLANT
SUT VIC AB 2-0 CT1 27 (SUTURE) ×6
SUT VIC AB 2-0 CT1 TAPERPNT 27 (SUTURE) ×2 IMPLANT
SUTURE STRATFX 0 PDS 27 VIOLET (SUTURE) ×1 IMPLANT
TRAY FOLEY MTR SLVR 16FR STAT (SET/KITS/TRAYS/PACK) IMPLANT
WATER STERILE IRR 1000ML POUR (IV SOLUTION) ×3 IMPLANT
YANKAUER SUCT BULB TIP 10FT TU (MISCELLANEOUS) IMPLANT

## 2019-05-23 NOTE — Op Note (Signed)
NAME:  Vernon ANTOLINI.: [id number]      MEDICAL RECORD NO.: [id number]      FACILITY:  [facility]      PHYSICIAN:  [name]  DATE OF BIRTH:  08/27/1940     DATE OF PROCEDURE:  05/23/2019                                 OPERATIVE REPORT         PREOPERATIVE DIAGNOSIS: Left  hip osteoarthritis.      POSTOPERATIVE DIAGNOSIS:  Left hip osteoarthritis.      PROCEDURE:  Left total hip replacement through an anterior approach   utilizing DePuy THR system, component size 45mm pinnacle cup, a size 36+4 neutral   Altrex liner, a size 7 Hi Actis stem with a 36+5 Articuleze metal head ball.      SURGEON:  Pietro Cassis. Alvan Dame, M.D.      ASSISTANT:  Danae Orleans, PA-C     ANESTHESIA:  Spinal.      SPECIMENS:  None.      COMPLICATIONS:  None.      BLOOD LOSS:  600 cc     DRAINS:  None.      INDICATION OF THE PROCEDURE:  Vernon Bishop is a 79 y.o. male who had   presented to office for evaluation of left hip pain.  Radiographs revealed   progressive degenerative changes with bone-on-bone   articulation of the  hip joint, including subchondral cystic changes and osteophytes.  The patient had painful limited range of   motion significantly affecting their overall quality of life and function.  The patient was failing to    respond to conservative measures including medications and/or injections and activity modification and at this point was ready   to proceed with more definitive measures.  Consent was obtained for   benefit of pain relief.  Specific risks of infection, DVT, component   failure, dislocation, neurovascular injury, and need for revision surgery were reviewed in the office as well discussion of   the anterior versus posterior approach were reviewed.     PROCEDURE IN DETAIL:  The patient was brought to operative theater.   Once adequate anesthesia, preoperative antibiotics, 2 gm of Ancef, 1 gm of Tranexamic Acid, and 10 mg  of Decadron were administered, the patient was positioned supine on the [employer] table.  Once the patient was safely positioned with adequate padding of boney prominences we predraped out the hip, and used fluoroscopy to confirm orientation of the pelvis.      The left hip was then prepped and draped from proximal iliac crest to   mid thigh with a shower curtain technique.      Time-out was performed identifying the patient, planned procedure, and the appropriate extremity.     An incision was then made 2 cm lateral to the   anterior superior iliac spine extending over the orientation of the   tensor fascia lata muscle and sharp dissection was carried down to the   fascia of the muscle.      The fascia was then incised.  The muscle belly was identified and swept   laterally and retractor placed along the superior neck.  Following   cauterization of the circumflex vessels and removing some pericapsular  fat, a second cobra retractor was placed on the inferior neck.  A T-capsulotomy was made along the line of the   superior neck to the trochanteric fossa, then extended proximally and   distally.  Tag sutures were placed and the retractors were then placed   intracapsular.  We then identified the trochanteric fossa and   orientation of my neck cut and then made a neck osteotomy with the femur on traction.  The femoral   head was removed without difficulty or complication.  Traction was let   off and retractors were placed posterior and anterior around the   acetabulum.      The labrum and foveal tissue were debrided.  I began reaming with a 48 mm   reamer and reamed up to 57 mm reamer with good bony bed preparation and a 58 mm  cup was chosen.  The final 58 mm Pinnacle cup was then impacted under fluoroscopy to confirm the depth of penetration and orientation with respect to   Abduction and forward flexion.  A screw was placed into the ilium followed by the hole eliminator.  The final    36+4 neutral Altrex liner was impacted with good visualized rim fit.  The cup was positioned anatomically within the acetabular portion of the pelvis.      At this point, the femur was rolled to 100 degrees.  Further capsule was   released off the inferior aspect of the femoral neck.  I then   released the superior capsule proximally.  With the leg in a neutral position the hook was placed laterally   along the femur under the vastus lateralis origin and elevated manually and then held in position using the hook attachment on the bed.  The leg was then extended and adducted with the leg rolled to 100   degrees of external rotation.  Retractors were placed along the medial calcar and posteriorly over the greater trochanter.  Once the proximal femur was fully   exposed, I used a box osteotome to set orientation.  I then began   broaching with the starting chili pepper broach and passed this by hand and then broached up to 7.  With the 7 broach in place I chose a high offset neck and did several trial reductions.  The offset was appropriate, leg lengths   appeared to be equal best matched with the +5 head ball trial confirmed radiographically.   Given these findings, I went ahead and dislocated the hip, repositioned all   retractors and positioned the right hip in the extended and abducted position.  The final 7 Hi Actis stem was   chosen and it was impacted down to the level of neck cut.  Based on this   and the trial reductions, a final 36+5 Articuleze metal head ball was chosen and   impacted onto a clean and dry trunnion, and the hip was reduced.  The   hip had been irrigated throughout the case again at this point.  I did   reapproximate the superior capsular leaflet to the anterior leaflet   using #1 Vicryl.  The fascia of the   tensor fascia lata muscle was then reapproximated using #1 Vicryl and #0 Stratafix sutures.  The   remaining wound was closed with 2-0 Vicryl and running 4-0 Monocryl.    The hip was cleaned, dried, and dressed sterilely using Dermabond and   Aquacel dressing.  The patient was then brought   to recovery room  in stable condition tolerating the procedure well.    Danae Orleans, PA-C was present for the entirety of the case involved from   preoperative positioning, perioperative retractor management, general   facilitation of the case, as well as primary wound closure as assistant.            Pietro Cassis Alvan Dame, M.D.        05/23/2019 10:10 AM

## 2019-05-23 NOTE — Transfer of Care (Signed)
Immediate Anesthesia Transfer of Care Note  Patient: Vernon Bishop  Procedure(s) Performed: TOTAL HIP ARTHROPLASTY ANTERIOR APPROACH (Left Hip)  Patient Location: PACU  Anesthesia Type:Spinal  Level of Consciousness: drowsy  Airway & Oxygen Therapy: Patient Spontanous Breathing and Patient connected to face mask oxygen  Post-op Assessment: Report given to RN and Post -op Vital signs reviewed and stable  Post vital signs: Reviewed and stable  Last Vitals:  Vitals Value Taken Time  BP 89/48 05/23/19 1317  Temp    Pulse 46 05/23/19 1319  Resp 13 05/23/19 1319  SpO2 100 % 05/23/19 1319  Vitals shown include unvalidated device data.  Last Pain:  Vitals:   05/23/19 0931  TempSrc: Oral         Complications: No apparent anesthesia complications

## 2019-05-23 NOTE — Interval H&P Note (Signed)
History and Physical Interval Note:  05/23/2019 10:03 AM  Vernon Bishop  has presented today for surgery, with the diagnosis of Left hip osteoarthritis.  The various methods of treatment have been discussed with the patient and family. After consideration of risks, benefits and other options for treatment, the patient has consented to  Procedure(s) with comments: TOTAL HIP ARTHROPLASTY ANTERIOR APPROACH (Left) - 70 mins as a surgical intervention.  The patient's history has been reviewed, patient examined, no change in status, stable for surgery.  I have reviewed the patient's chart and labs.  Questions were answered to the patient's satisfaction.     [name]

## 2019-05-23 NOTE — Discharge Instructions (Signed)

## 2019-05-23 NOTE — Progress Notes (Signed)
Per patient's request, this writer will return around 2300-2330 to assist with nocturnal cpap.

## 2019-05-23 NOTE — Anesthesia Procedure Notes (Signed)
Date/Time: 05/23/2019 11:16 AM Performed by: Sharlette Dense, CRNA Oxygen Delivery Method: Simple face mask

## 2019-05-23 NOTE — Plan of Care (Signed)

## 2019-05-23 NOTE — Evaluation (Signed)
Physical Therapy Evaluation Patient Details Name: Vernon Bishop MRN: [id number] DOB: [birth year] Today's Date: 05/23/2019   History of Present Illness  Pt is 79 y.o. male s/p Lt THA anterior approach on 05/23/19 with PMH significant for PTSD, OA, LBP, HTN, HLD, DM, Afib, Rt THA in 2014.    Clinical Impression  Vernon Bishop is a 79 y.o. male POD 0 s/p Lt THA anterior approach. Patient reports modified independence with use of SPC for mobility at baseline. Patient is now limited by functional impairments (see PT problem list below) and requires min-mod assist for transfers and gait with RW. Patient was able to ambulate ~120 feet with RW and min assist. Patient instructed in exercise to facilitate circulation and LE strengthening. Patient will benefit from continued skilled PT interventions to address impairments and progress towards PLOF. Acute PT will follow to progress mobility and stair training in preparation for safe discharge home.     Follow Up Recommendations Follow surgeon's recommendation for DC plan and follow-up therapies    Equipment Recommendations  None recommended by PT    Recommendations for Other Services       Precautions / Restrictions Precautions Precautions: Fall Restrictions Weight Bearing Restrictions: No      Mobility  Bed Mobility Overal bed mobility: Needs Assistance Bed Mobility: Supine to Sit     Supine to sit: HOB elevated;Min assist     General bed mobility comments: cues for use of bed rail, assist required to walk LE's to EOB and to initiate raising trunk to sit upright  Transfers Overall transfer level: Needs assistance Equipment used: Rolling walker (2 wheeled) Transfers: Sit to/from Stand Sit to Stand: Mod assist         General transfer comment: cues for safe hand placement and technique with RW, assist required from low surface to initiate power up and complete rise  Ambulation/Gait Ambulation/Gait assistance: Min assist Gait  Distance (Feet): 120 Feet Assistive device: Rolling walker (2 wheeled) Gait Pattern/deviations: Step-through pattern;Narrow base of support;Decreased stride length;Decreased stance time - left Gait velocity: fair   General Gait Details: cues for safe step pattern and proximity to RW, assist to manage RW placement. pt required cues to slow down gait speed intermittently for safety and to increase step width.  Stairs            Wheelchair Mobility    Modified Rankin (Stroke Patients Only)       Balance Overall balance assessment: Needs assistance Sitting-balance support: Feet supported Sitting balance-Leahy Scale: Good     Standing balance support: During functional activity;Bilateral upper extremity supported Standing balance-Leahy Scale: Poor              Pertinent Vitals/Pain Pain Assessment: 0-10 Pain Score: 7  Pain Location: Lt hip; and back Pain Descriptors / Indicators: Aching;Burning Pain Intervention(s): Limited activity within patient's tolerance;Repositioned;Monitored during session    Home Living Family/patient expects to be discharged to:: Private residence Living Arrangements: Spouse/significant other Available Help at Discharge: Family Type of Home: House Home Access: Stairs to enter Entrance Stairs-Rails: Can reach both Entrance Stairs-Number of Steps: 6 Home Layout: One level Home Equipment: Grab bars - tub/shower;Bedside commode;Walker - 2 wheels;Cane - single point      Prior Function Level of Independence: Independent               Hand Dominance   Dominant Hand: Right    Extremity/Trunk Assessment   Upper Extremity Assessment Upper Extremity Assessment: Overall WFL for tasks assessed  Lower Extremity Assessment Lower Extremity Assessment: Generalized weakness;LLE deficits/detail LLE Sensation: WNL LLE Coordination: WNL    Cervical / Trunk Assessment Cervical / Trunk Assessment: Normal  Communication    Communication: No difficulties  Cognition Arousal/Alertness: Awake/alert Behavior During Therapy: WFL for tasks assessed/performed Overall Cognitive Status: Within Functional Limits for tasks assessed           General Comments      Exercises Total Joint Exercises Ankle Circles/Pumps: AROM;Both;10 reps;Seated Quad Sets: AROM;Seated;5 reps;Left   Assessment/Plan    PT Assessment Patient needs continued PT services  PT Problem List Decreased strength;Decreased mobility;Decreased range of motion;Decreased activity tolerance;Decreased balance;Decreased knowledge of use of DME       PT Treatment Interventions DME instruction;Therapeutic exercise;Balance training;Gait training;Stair training;Functional mobility training;Therapeutic activities;Patient/family education    PT Goals (Current goals can be found in the Care Plan section)  Acute Rehab PT Goals Patient Stated Goal: to get walking with less pain PT Goal Formulation: With patient Time For Goal Achievement: 05/30/19 Potential to Achieve Goals: Good    Frequency 7X/week    AM-PAC PT "6 Clicks" Mobility  Outcome Measure Help needed turning from your back to your side while in a flat bed without using bedrails?: A Little Help needed moving from lying on your back to sitting on the side of a flat bed without using bedrails?: A Little Help needed moving to and from a bed to a chair (including a wheelchair)?: A Little Help needed standing up from a chair using your arms (e.g., wheelchair or bedside chair)?: A Little Help needed to walk in hospital room?: A Little Help needed climbing 3-5 steps with a railing? : A Lot 6 Click Score: 17    End of Session Equipment Utilized During Treatment: Gait belt Activity Tolerance: Patient tolerated treatment well Patient left: in chair;with chair alarm set;with call bell/phone within reach Nurse Communication: Mobility status PT Visit Diagnosis: Unsteadiness on feet (R26.81);Muscle  weakness (generalized) (M62.81);Difficulty in walking, not elsewhere classified (R26.2)    Time: [id number] PT Time Calculation (min) (ACUTE ONLY): 15 min   Charges:   PT Evaluation $PT Eval Low Complexity: 1 Low        Verner Mould, DPT Physical Therapist with [facility] [phone]  05/23/2019 7:14 PM

## 2019-05-23 NOTE — Anesthesia Procedure Notes (Signed)
Spinal  Patient location during procedure: OR Start time: 05/23/2019 11:21 AM End time: 05/23/2019 11:30 AM Staffing Performed: anesthesiologist  Anesthesiologist: Albertha Ghee, MD Preanesthetic Checklist Completed: patient identified, IV checked, risks and benefits discussed, surgical consent, monitors and equipment checked, pre-op evaluation and timeout performed Spinal Block Patient position: sitting Prep: DuraPrep Patient monitoring: cardiac monitor, continuous pulse ox and blood pressure Approach: left paramedian Location: L3-4 Injection technique: single-shot Needle Needle type: Spinocan  Needle gauge: 22 G Needle length: 9 cm Assessment Sensory level: T10 Additional Notes Functioning IV was confirmed and monitors were applied. Sterile prep and drape, including hand hygiene and sterile gloves were used. The patient was positioned and the spine was prepped. The skin was anesthetized with lidocaine.  Free flow of clear CSF was obtained prior to injecting local anesthetic into the CSF.  The spinal needle aspirated freely following injection.  The needle was carefully withdrawn.  The patient tolerated the procedure well.

## 2019-05-24 ENCOUNTER — Encounter (HOSPITAL_COMMUNITY): Payer: Self-pay | Admitting: Orthopedic Surgery

## 2019-05-24 DIAGNOSIS — M1612 Unilateral primary osteoarthritis, left hip: Secondary | ICD-10-CM | POA: Diagnosis not present

## 2019-05-24 LAB — CBC
HCT: 30.5 % — ABNORMAL LOW (ref 39.0–52.0)
Hemoglobin: 10 g/dL — ABNORMAL LOW (ref 13.0–17.0)
MCH: 31.7 pg (ref 26.0–34.0)
MCHC: 32.8 g/dL (ref 30.0–36.0)
MCV: 96.8 fL (ref 80.0–100.0)
Platelets: 159 10*3/uL (ref 150–400)
RBC: 3.15 MIL/uL — ABNORMAL LOW (ref 4.22–5.81)
RDW: 13.3 % (ref 11.5–15.5)
WBC: 9.5 10*3/uL (ref 4.0–10.5)
nRBC: 0 % (ref 0.0–0.2)

## 2019-05-24 LAB — BASIC METABOLIC PANEL
Anion gap: 7 (ref 5–15)
BUN: 12 mg/dL (ref 8–23)
CO2: 25 mmol/L (ref 22–32)
Calcium: 8 mg/dL — ABNORMAL LOW (ref 8.9–10.3)
Chloride: 103 mmol/L (ref 98–111)
Creatinine, Ser: 0.75 mg/dL (ref 0.61–1.24)
GFR calc Af Amer: >60 mL/min (ref >60)
GFR calc non Af Amer: >60 mL/min (ref >60)
Glucose, Bld: 108 mg/dL — ABNORMAL HIGH (ref 70–99)
Potassium: 3.7 mmol/L (ref 3.5–5.1)
Sodium: 135 mmol/L (ref 135–145)

## 2019-05-24 MED ORDER — POLYETHYLENE GLYCOL 3350 17 G PO PACK: 17.0000 g | PACK | Freq: Two times a day (BID) | ORAL | 0 refills | Status: DC

## 2019-05-24 MED ORDER — DOCUSATE SODIUM 100 MG PO CAPS: 100.0000 mg | ORAL_CAPSULE | Freq: Two times a day (BID) | ORAL | 0 refills | Status: DC

## 2019-05-24 MED ORDER — ASPIRIN 81 MG PO CHEW: 81.0000 mg | CHEWABLE_TABLET | Freq: Two times a day (BID) | ORAL | 0 refills | Status: AC

## 2019-05-24 MED ORDER — TIZANIDINE HCL 4 MG PO TABS: 4.0000 mg | ORAL_TABLET | Freq: Three times a day (TID) | ORAL | 0 refills | Status: DC | PRN

## 2019-05-24 MED ORDER — FERROUS SULFATE 325 (65 FE) MG PO TABS: 325.0000 mg | ORAL_TABLET | Freq: Three times a day (TID) | ORAL | 0 refills | Status: DC

## 2019-05-24 MED ORDER — HYDROCODONE-ACETAMINOPHEN 5-325 MG PO TABS: 1.0000 | ORAL_TABLET | ORAL | 0 refills | Status: DC | PRN

## 2019-05-24 NOTE — H&P (Deleted)
     Subjective: 1 Day Post-Op Procedure(s) (LRB): TOTAL HIP ARTHROPLASTY ANTERIOR APPROACH (Left)   Patient reports pain as mild, pain controlled.  No reported events throughout the night.  [name] discussed the procedure, findings and expectations moving forward.  Patient is ready be discharged home, if he does well therapy.     Objective:   VITALS:   Vitals:   05/24/19 0308 05/24/19 0647  BP: (!) 132/56 (!) 116/55  Pulse: 62 62  Resp: 16 16  Temp: 98.7 F (37.1 C) 98.7 F (37.1 C)  SpO2: 97%     Dorsiflexion/Plantar flexion intact Incision: dressing C/D/I No cellulitis present Compartment soft  LABS Recent Labs    05/24/19 0518  HGB 10.0*  HCT 30.5*  WBC 9.5  PLT 159    Recent Labs    05/24/19 0518  NA 135  K 3.7  BUN 12  CREATININE 0.75  GLUCOSE 108*     Assessment/Plan: 1 Day Post-Op Procedure(s) (LRB): TOTAL HIP ARTHROPLASTY ANTERIOR APPROACH (Left) Foley cath d/c'ed Advance diet Up with therapy D/C IV fluids Discharge home Follow up in 2 weeks at [facility] Follow up with OLIN,Jemario Poitras D in 2 weeks.  Contact information:  [facility] [address], [address] [place] [id number]         Danae Orleans PA-C  [facility]  [place] [address], [place], [place], [place] [address] Phone: [phone] [email or web address] Facebook  [employer]

## 2019-05-24 NOTE — Progress Notes (Signed)
Physical Therapy Treatment Patient Details Name: Vernon Bishop MRN: [id number] DOB: 02/21/1941 Today's Date: 05/24/2019    History of Present Illness Pt is 79 y.o. male s/p Lt THA anterior approach on 05/23/19 with PMH significant for PTSD, OA, LBP, HTN, HLD, DM, Afib, Rt THA in 2014.    PT Comments    POD # 1  Spouse present during session General transfer comment: 25% VC's on proper hand placement and increased time due to pain.  General Gait Details: with spouse present for "hnads on" instruction, pt amb in hallway to the stairwell and back. General stair comments: with spouse "hands on" assisted with 5 stair steps/one rail and and 25% VC's on safety and proper sequencing.  Performed well.Then returned to room to perform some TE's following HEP handout.  Instructed on proper tech, freq as well as use of ICE.   Addressed all mobility questions, discussed appropriate activity, educated on use of ICE.  Pt ready for D/C to home.   Follow Up Recommendations  Follow surgeon's recommendation for DC plan and follow-up therapies(HEP)     Equipment Recommendations  None recommended by PT    Recommendations for Other Services       Precautions / Restrictions Precautions Precautions: Fall Restrictions Weight Bearing Restrictions: No    Mobility  Bed Mobility               General bed mobility comments: OOB in recliner  Transfers Overall transfer level: Needs assistance Equipment used: Rolling walker (2 wheeled) Transfers: Sit to/from Stand Sit to Stand: Supervision;Min guard         General transfer comment: 25% VC's on proper hand placement and increased time due to pain  Ambulation/Gait Ambulation/Gait assistance: Supervision;Min guard Gait Distance (Feet): 85 Feet Assistive device: Rolling walker (2 wheeled) Gait Pattern/deviations: Step-through pattern;Narrow base of support;Decreased stride length;Decreased stance time - left Gait velocity: decreased   General  Gait Details: with spouse present for "hnads on" instruction, pt amb in hallway to the stairwell and back.   Stairs Stairs: Yes Stairs assistance: Supervision;Min guard Stair Management: One rail Left;Step to pattern;Sideways;Forwards Number of Stairs: 5 General stair comments: with spouse "hands on" assisted with 5 stair steps/one rail and and 25% VC's on safety and proper sequencing.  Performed well.   Wheelchair Mobility    Modified Rankin (Stroke Patients Only)       Balance                                            Cognition Arousal/Alertness: Awake/alert Behavior During Therapy: WFL for tasks assessed/performed Overall Cognitive Status: Within Functional Limits for tasks assessed                                        Exercises   Total Hip Replacement TE's 10 reps ankle pumps 10 reps knee presses 10 reps heel slides 10 reps SAQ's 10 reps ABD Followed by ICE     General Comments        Pertinent Vitals/Pain Pain Assessment: 0-10 Pain Score: 4  Pain Location: L hip Pain Descriptors / Indicators: Aching;Burning;Tender;Operative site guarding Pain Intervention(s): Monitored during session;Repositioned;Ice applied;Premedicated before session    Home Living  Prior Function            PT Goals (current goals can now be found in the care plan section) Progress towards PT goals: Progressing toward goals    Frequency    7X/week      PT Plan Current plan remains appropriate    Co-evaluation              AM-PAC PT "6 Clicks" Mobility   Outcome Measure  Help needed turning from your back to your side while in a flat bed without using bedrails?: None Help needed moving from lying on your back to sitting on the side of a flat bed without using bedrails?: None Help needed moving to and from a bed to a chair (including a wheelchair)?: None Help needed standing up from a chair using  your arms (e.g., wheelchair or bedside chair)?: None Help needed to walk in hospital room?: None Help needed climbing 3-5 steps with a railing? : A Little 6 Click Score: 23    End of Session Equipment Utilized During Treatment: Gait belt Activity Tolerance: Patient tolerated treatment well Patient left: in chair;with chair alarm set;with call bell/phone within reach Nurse Communication: (pt ready for D/C to home) PT Visit Diagnosis: Unsteadiness on feet (R26.81);Muscle weakness (generalized) (M62.81);Difficulty in walking, not elsewhere classified (R26.2)     Time: 1005-1036 PT Time Calculation (min) (ACUTE ONLY): 31 min  Charges:  $Gait Training: 8-22 mins $Therapeutic Exercise: 8-22 mins                     Rica Koyanagi  PTA Acute  Rehabilitation Services Pager      [phone] Office      [phone]

## 2019-05-24 NOTE — Progress Notes (Signed)
     Subjective: 1 Day Post-Op Procedure(s) (LRB): TOTAL HIP ARTHROPLASTY ANTERIOR APPROACH (Left)   Patient reports pain as mild, pain controlled.  No reported events throughout the night.  [name] discussed the procedure, findings and expectations moving forward.  Patient is ready be discharged home, if he does well therapy.   Objective:   VITALS:   Vitals:   05/24/19 0308 05/24/19 0647  BP: (!) 132/56 (!) 116/55  Pulse: 62 62  Resp: 16 16  Temp: 98.7 F (37.1 C) 98.7 F (37.1 C)  SpO2: 97%     Dorsiflexion/Plantar flexion intact Incision: dressing C/D/I No cellulitis present Compartment soft  LABS Recent Labs    05/24/19 0518  HGB 10.0*  HCT 30.5*  WBC 9.5  PLT 159    Recent Labs    05/24/19 0518  NA 135  K 3.7  BUN 12  CREATININE 0.75  GLUCOSE 108*     Assessment/Plan: 1 Day Post-Op Procedure(s) (LRB): TOTAL HIP ARTHROPLASTY ANTERIOR APPROACH (Left) Foley cath d/c'ed Advance diet Up with therapy D/C IV fluids Discharge home Follow up in 2 weeks at [facility] Follow up with OLIN,Caroleann Casler D in 2 weeks.  Contact information:  [facility] [address], [address] [place] [id number]          Danae Orleans PA-C  [facility]  [place] [address], [place], [place], [place] [address] Phone: [phone] [email or web address] Facebook  [employer]

## 2019-05-25 ENCOUNTER — Telehealth: Payer: Self-pay | Admitting: *Deleted

## 2019-05-25 NOTE — Telephone Encounter (Signed)
Pt was on TCM report admitted 05/23/19 for Lefthip primary OA / pain. Patient underwent TOTAL HIP ARTHROPLASTY ANTERIOR APPROACH (Left). Pt tolerated procedure well, and D/C 05/24/19. Pt will f/u w/Dr. Honor Loh in 2 weeks.Marland KitchenJohny Chess

## 2019-05-25 NOTE — Discharge Summary (Signed)
Physician Discharge Summary  Patient ID: Vernon Bishop MRN: [id number] DOB/AGE: Mar 03, 1941 79 y.o.  Admit date: 05/23/2019 Discharge date: 05/24/2019   Procedures:  Procedure(s) (LRB): TOTAL HIP ARTHROPLASTY ANTERIOR APPROACH (Left)  Attending Physician:  [name]   Admission Diagnoses:   Left hip primary OA / pain  Discharge Diagnoses:  Active Problems:   S/P hip replacement, left  Past Medical History:  Diagnosis Date  . Atrial fibrillation ([place])    1992  . DDD (degenerative disc disease)   . DDD (degenerative disc disease)   . Depression   . Diabetes mellitus    type II  - PT STATES NOT ON ANY DIABETIC MEDICINES  . Dysrhythmia    aFIB  . ED (erectile dysfunction)   . Hyperlipidemia   . Hypertension   . Hypogonadism male   . LBP (low back pain)   . MVP (mitral valve prolapse)   . OSA on CPAP    cpap  . Osteoarthritis   . Osteoarthritis, hand    SEVERE LOWER BACK PAIN, AND RESTLESS LEG SYNDROME  . Palpitations   . PTSD (post-traumatic stress disorder)   . Swallowing problem    HX OF PILLS GETTING "STUCK" IN THROAT AT TIMES  . Vitamin D deficiency     HPI:    Vernon Bishop, 79 y.o. male, has a history of pain and functional disability in the left hip(s) due to arthritis and patient has failed non-surgical conservative treatments for greater than 12 weeks to include activity modification.  Onset of symptoms was gradual starting 2 years ago with gradually worsening course since that time.The patient noted no past surgery on the left hip(s).  Patient currently rates pain in the left hip at 8 out of 10 with activity. Patient has worsening of pain with activity and weight bearing and pain that interfers with activities of daily living. Patient has evidence of joint space narrowing by imaging studies. This condition presents safety issues increasing the risk of falls. There is no current active infection.   PCP: [name], [name], MD   Discharged  Condition: good  Hospital Course:  Patient underwent the above stated procedure on 05/23/2019. Patient tolerated the procedure well and brought to the recovery room in good condition and subsequently to the floor.  POD #1 BP: 116/55 ; Pulse: 62 ; Temp: 98.7 F (37.1 C) ; Resp: 16 Patient reports pain asmild,pain controlled. No reported events throughout the night. [name] discussed the procedure, findings and expectations moving forward. Patient is ready be discharged home. Dorsiflexion/plantar flexion intact, incision: dressing C/D/I, no cellulitis present and compartment soft.   LABS  Basename    HGB     10.0  HCT     30.5     Discharge Exam: General appearance: alert, cooperative and no distress Extremities: Homans sign is negative, no sign of DVT, no edema, redness or tenderness in the calves or thighs and no ulcers, gangrene or trophic changes  Disposition: Home with follow up in 2 weeks   Follow-up Information    Paralee Cancel, MD. Schedule an appointment as soon as possible for a visit in 2 weeks.   Specialty: Orthopedic Surgery Contact information: [address] [place] [address] [id number]           Discharge Instructions    Call MD / Call 911   Complete by: As directed    If you experience chest pain or shortness of breath, CALL 911 and be  transported to the hospital emergency room.  If you develope a fever above 101 F, pus (white drainage) or increased drainage or redness at the wound, or calf pain, call your surgeon's office.   Change dressing   Complete by: As directed    Maintain surgical dressing until follow up in the clinic. If the edges start to pull up, may reinforce with tape. If the dressing is no longer working, may remove and cover with gauze and tape, but must keep the area dry and clean.  Call with any questions or concerns.   Constipation Prevention   Complete by: As directed    Drink plenty of fluids.  Prune juice may  be helpful.  You may use a stool softener, such as Colace (over the counter) 100 mg twice a day.  Use MiraLax (over the counter) for constipation as needed.   Diet - low sodium heart healthy   Complete by: As directed    Discharge instructions   Complete by: As directed    Maintain surgical dressing until follow up in the clinic. If the edges start to pull up, may reinforce with tape. If the dressing is no longer working, may remove and cover with gauze and tape, but must keep the area dry and clean.  Follow up in 2 weeks at [facility]. Call with any questions or concerns.   Increase activity slowly as tolerated   Complete by: As directed    Weight bearing as tolerated with assist device (walker, cane, etc) as directed, use it as long as suggested by your surgeon or therapist, typically at least 4-6 weeks.   TED hose   Complete by: As directed    Use stockings (TED hose) for 2 weeks on both leg(s).  You may remove them at night for sleeping.      Allergies as of 05/24/2019      Reactions   Abilify [aripiprazole]    SOB feeling   Cymbalta [duloxetine Hcl]    constipation   Digoxin And Related    HR 41   Duloxetine    REACTION: constipation   Prednisolone    Edema, wt gain   Temazepam       Medication List    STOP taking these medications   cefUROXime 250 MG tablet Commonly known as: Ceftin   ibuprofen 200 MG tablet Commonly known as: ADVIL   neomycin-polymyxin-hydrocortisone OTIC solution Commonly known as: CORTISPORIN     TAKE these medications   amLODipine 10 MG tablet Commonly known as: NORVASC TAKE 1 TABLET BY MOUTH EVERY MORNING. What changed: when to take this   aspirin 81 MG chewable tablet Commonly known as: Aspirin Childrens Chew 1 tablet (81 mg total) by mouth 2 (two) times daily. Take for 4 weeks, then resume regular dose.   buPROPion 150 MG 24 hr tablet Commonly known as: WELLBUTRIN XL TAKE 3 TABLETS BY MOUTH EVERY MORNING What changed:   how much  to take  how to take this  when to take this  additional instructions   cholecalciferol 25 MCG (1000 UNIT) tablet Commonly known as: VITAMIN D Take 1,000 Units by mouth daily.   ciclopirox 8 % solution Commonly known as: Penlac Apply topically at bedtime. Apply over nail and surrounding skin. Apply daily over previous coat. After seven (7) days, may remove with alcohol and continue cycle. What changed:   how much to take  when to take this   clotrimazole-betamethasone cream Commonly known as: [place]  2 TIMES A DAY. What changed: See the new instructions.   docusate sodium 100 MG capsule Commonly known as: Colace Take 1 capsule (100 mg total) by mouth 2 (two) times daily.   dutasteride 0.5 MG capsule Commonly known as: AVODART TAKE 1 CAPSULE BY MOUTH DAILY.   ferrous sulfate 325 (65 FE) MG tablet Commonly known as: FerrouSul Take 1 tablet (325 mg total) by mouth 3 (three) times daily with meals for 14 days.   FLUoxetine 40 MG capsule Commonly known as: PROZAC Take 1 capsule (40 mg total) by mouth daily.   gabapentin 300 MG capsule Commonly known as: NEURONTIN TAKE 1 CAPSULE BY MOUTH THREE TIMES A DAY What changed: See the new instructions.   HYDROcodone-acetaminophen 5-325 MG tablet Commonly known as: Norco Take 1-2 tablets by mouth every 4 (four) hours as needed for moderate pain or severe pain.   ketoconazole 2 % shampoo Commonly known as: NIZORAL APPLY AS DIRECTED 2 TIMES A WEEK What changed: See the new instructions.   Lubricant Eye Drops 0.4-0.3 % Soln Generic drug: Polyethyl Glycol-Propyl Glycol Place 1 drop into both eyes 3 (three) times daily as needed (dry/irritated eyes.).   morphine 30 MG 12 hr tablet Commonly known as: MS CONTIN Take 1 tablet (30 mg total) by mouth every 12 (twelve) hours. What changed: Another medication with the same name was removed. Continue taking this medication, and follow the directions you see  here.   polyethylene glycol 17 g packet Commonly known as: MIRALAX / GLYCOLAX Take 17 g by mouth 2 (two) times daily. What changed:   when to take this  reasons to take this   propranolol 80 MG tablet Commonly known as: INDERAL TAKE 1 TABLET BY MOUTH 3 TIMES DAILY.   testosterone cypionate 200 MG/ML injection Commonly known as: DEPOTESTOSTERONE CYPIONATE INJECT 3 MILLILITERS INTO THE MUSCLE EVERY 14 DAYS What changed:   how much to take  how to take this  when to take this  additional instructions   tiZANidine 4 MG tablet Commonly known as: ZANAFLEX Take 1 tablet (4 mg total) by mouth every 8 (eight) hours as needed (spasms). TAKE 1 TABLET BY MOUTH EVERY 8 HOURS AS NEEDED FOR MUSCLE SPASMS.   traZODone 50 MG tablet Commonly known as: DESYREL TAKE 1 TO 2 TABLETS BY MOUTH AT BEDTIME. What changed:   when to take this  reasons to take this   Vitamin B-12 CR 1000 MCG Tbcr Take 1,000 mcg by mouth daily.            Discharge Care Instructions  (From admission, onward)         Start     Ordered   05/24/19 0000  Change dressing    Comments: Maintain surgical dressing until follow up in the clinic. If the edges start to pull up, may reinforce with tape. If the dressing is no longer working, may remove and cover with gauze and tape, but must keep the area dry and clean.  Call with any questions or concerns.   05/24/19 0803           Signed: West Pugh. Ailed Defibaugh   PA-C  05/25/2019, 9:05 AM

## 2019-05-26 ENCOUNTER — Encounter: Payer: Self-pay | Admitting: Anesthesiology

## 2019-05-26 NOTE — Anesthesia Postprocedure Evaluation (Signed)
Anesthesia Post Note  Patient: Vernon Bishop  Procedure(s) Performed: TOTAL HIP ARTHROPLASTY ANTERIOR APPROACH (Left Hip)     Patient location during evaluation: PACU Anesthesia Type: MAC and Spinal Level of consciousness: oriented and awake and alert Pain management: pain level controlled Vital Signs Assessment: post-procedure vital signs reviewed and stable Respiratory status: spontaneous breathing, respiratory function stable and patient connected to nasal cannula oxygen Cardiovascular status: blood pressure returned to baseline and stable Postop Assessment: no headache, no backache and no apparent nausea or vomiting Anesthetic complications: no    Last Vitals:  Vitals:   05/24/19 0647 05/24/19 0842  BP: (!) 116/55 117/70  Pulse: 62 68  Resp: 16 16  Temp: 37.1 C 37.1 C  SpO2:  98%    Last Pain:  Vitals:   05/24/19 0842  TempSrc: Oral  PainSc:    Pain Goal: Patients Stated Pain Goal: 6 (05/23/19 1807)                 [name] S

## 2019-06-06 ENCOUNTER — Other Ambulatory Visit: Payer: Self-pay

## 2019-06-06 ENCOUNTER — Ambulatory Visit (INDEPENDENT_AMBULATORY_CARE_PROVIDER_SITE_OTHER): Payer: PPO | Admitting: Neurology

## 2019-06-06 DIAGNOSIS — R531 Weakness: Secondary | ICD-10-CM | POA: Diagnosis not present

## 2019-06-06 DIAGNOSIS — M5417 Radiculopathy, lumbosacral region: Secondary | ICD-10-CM

## 2019-06-06 DIAGNOSIS — G5622 Lesion of ulnar nerve, left upper limb: Secondary | ICD-10-CM

## 2019-06-08 NOTE — Procedures (Signed)
[facility]  [address]  [place], [place] [address] Tel: [phone] Fax:  [phone] Test Date:  06/06/2019  Patient: Vernon Bishop DOB: 09/29/1940 Physician: Narda Amber, DO  Sex: Male Height: 6' " Ref Phys: Narda Amber, DO  ID#: [id number] Temp: 33.0C Technician:    Patient Complaints: This is a 79 year-old referred for evaluation of generalized weakness of the arms and legs.   NCV & EMG Findings: Extensive electrodiagnostic testing of the left upper and lower extremities shows: 1.  Left median and radial sensory responses are within normal limits.  Left ulnar, sural, and superficial peroneal sensory responses are absent. 2. Left medial motor response is within normal limits.  Left ulnar motor response is prolonged (4.4 ms) and markedly reduced (0.8 mV) at the abductor digiti minimi and absent at the first dorsal interosseus muscle.  Left peroneal motor response at the extensor digitorum brevis is absent, and reduced at the tibialis anterior (1.5 mV).  Left tibial motor response is reduced (2.5 mV). 3. Left tibial H-reflex study is absent. 4. In the left upper extremity, no motor unit recruitment is seen in ulnar-innervated muscles.  5. In the left lower extremity, chronic motor axon loss changes are seen in the L5 myotomes and sparse small, complex motor units are see in the rectus femoris muscle.  Impression: 1. 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 2. Chronic L5 radiculopathy affecting the left lower extremity, severe. 3. Left ulnar axonal neuropathy,  nonlocalizable, severe. 4. Sparse myopathic motor units were isolated to the rectus femoris muscle and too limited for definitive diagnosis.  Correlate clinically.    ___________________________ Narda Amber, DO     Nerve Conduction Studies Anti Sensory Summary Table   Stim Site NR Peak (ms) Norm Peak (ms) P-T Amp (V) Norm P-T Amp  Left Median  Anti Sensory (2nd Digit)  Wrist    3.8 <3.8 10.2 >10  Left Radial Anti Sensory (Base 1st Digit)  Wrist    2.7 <2.8 12.8 >10  Left Sup Peroneal Anti Sensory (Ant Lat Mall)  12 cm NR  <4.6  >3  Left Sural Anti Sensory (Lat Mall)  Calf NR  <4.6  >3  Left Ulnar Anti Sensory (5th Digit)  Wrist NR  <3.2  >5   Motor Summary Table   Stim Site NR Onset (ms) Norm Onset (ms) O-P Amp (mV) Norm O-P Amp Site1 Site2 Delta-0 (ms) Dist (cm) Vel (m/s) Norm Vel (m/s)  Left Median Motor (Abd Poll Brev)  Wrist    3.8 <4.0 8.8 >5 Elbow Wrist 5.8 31.0 53 >50  Elbow    9.6  7.4         Left Peroneal Motor (Ext Dig Brev)  Ankle NR  <6.0  >2.5 B Fib Ankle  0.0  >40  B Fib NR     Poplt B Fib  0.0  >40  Poplt NR            Left Peroneal TA Motor (Tib Ant)  Fib Head    4.2 <4.5 1.5 >3 Poplit Fib Head 1.3 8.0 62 >40  Poplit    5.5  1.2         Left Tibial Motor (Abd Hall Brev)  Ankle    3.9 <6.0 2.5 >4 Knee Ankle 10.9 45.0 41 >40  Knee    14.8  2.5         Left Ulnar Motor (Abd Dig Minimi)  Wrist  4.4 <3.1 0.8 >7 B Elbow Wrist  28.0  >50  B Elbow NR     A Elbow B Elbow  10.0  >50  A Elbow NR            Left Ulnar (FDI) Motor (1st DI)  Wrist NR  <4.5  >7 B Elbow Wrist  0.0  >50  B Elbow NR     A Elbow B Elbow  0.0  >50  A Elbow NR             H Reflex Studies   NR H-Lat (ms) Lat Norm (ms) L-R H-Lat (ms)  Left Tibial (Gastroc)  NR  <35    EMG   Side Muscle Ins Act Fibs Psw Fasc Number Recrt Dur Dur. Amp Amp. Poly Poly. Comment  Left 1stDorInt Nml Nml Nml Nml NE None - - - - - - ATR  Left Abd Poll Brev Nml Nml Nml Nml Nml Nml Nml Nml Nml Nml Nml Nml N/A  Left Ext Indicis Nml Nml Nml Nml Nml Nml Nml Nml Nml Nml Nml Nml N/A  Left Triceps Nml Nml Nml Nml Nml Nml Nml Nml Nml Nml Nml Nml N/A  Left PronatorTeres Nml Nml Nml Nml Nml Nml Nml Nml Nml Nml Nml Nml N/A  Left Biceps Nml Nml Nml Nml Nml Nml Nml Nml Nml Nml Nml Nml N/A  Left Deltoid Nml Nml Nml Nml Nml Nml Nml Nml Nml Nml Nml Nml N/A  Left  AntTibialis Nml Nml Nml Nml SMU Rapid All 1+ All 1+ All 1+ ATR  Left Gastroc Nml Nml Nml Nml Nml Nml Nml Nml Nml Nml Nml Nml N/A  Left Flex Dig Long Nml Nml Nml Nml 3- Rapid All 1+ All 1+ All 1+ N/A  Left RectFemoris Nml Nml Nml Nml 1+ Nml Some 1+ Some 1- Some 1+ N/A  Left GluteusMed Nml Nml Nml Nml 1- Rapid Some 1+ Some 1+ Nml Nml N/A  Left ABD Dig Min Nml Nml Nml Nml NE None - - - - - - ATR      Waveforms:

## 2019-06-19 ENCOUNTER — Other Ambulatory Visit: Payer: Self-pay

## 2019-06-19 ENCOUNTER — Ambulatory Visit (INDEPENDENT_AMBULATORY_CARE_PROVIDER_SITE_OTHER): Payer: PPO | Admitting: Internal Medicine

## 2019-06-19 ENCOUNTER — Encounter: Payer: Self-pay | Admitting: Internal Medicine

## 2019-06-19 DIAGNOSIS — M1611 Unilateral primary osteoarthritis, right hip: Secondary | ICD-10-CM | POA: Diagnosis not present

## 2019-06-19 DIAGNOSIS — F411 Generalized anxiety disorder: Secondary | ICD-10-CM

## 2019-06-19 DIAGNOSIS — E871 Hypo-osmolality and hyponatremia: Secondary | ICD-10-CM | POA: Diagnosis not present

## 2019-06-19 DIAGNOSIS — M5417 Radiculopathy, lumbosacral region: Secondary | ICD-10-CM

## 2019-06-19 DIAGNOSIS — E538 Deficiency of other specified B group vitamins: Secondary | ICD-10-CM

## 2019-06-19 MED ORDER — MORPHINE SULFATE ER 30 MG PO TBCR: 30.0000 mg | EXTENDED_RELEASE_TABLET | Freq: Two times a day (BID) | ORAL | 0 refills | Status: DC

## 2019-06-19 NOTE — Assessment & Plan Note (Signed)
BMET 

## 2019-06-19 NOTE — Progress Notes (Signed)
Subjective:  Patient ID: Vernon Bishop, male    DOB: 06/08/1940  Age: 79 y.o. MRN: [id number]  CC: No chief complaint on file.   HPI Vernon Bishop presents for LBP, HTN, wt loss f/u S/p THR L - Dr Alvan Dame  Outpatient Medications Prior to Visit  Medication Sig Dispense Refill  . amLODipine (NORVASC) 10 MG tablet TAKE 1 TABLET BY MOUTH EVERY MORNING. (Patient taking differently: Take 10 mg by mouth daily. ) 90 tablet 3  . aspirin (ASPIRIN CHILDRENS) 81 MG chewable tablet Chew 1 tablet (81 mg total) by mouth 2 (two) times daily. Take for 4 weeks, then resume regular dose. 60 tablet 0  . buPROPion (WELLBUTRIN XL) 150 MG 24 hr tablet TAKE 3 TABLETS BY MOUTH EVERY MORNING (Patient taking differently: Take 450 mg by mouth daily. ) 90 tablet 3  . Cholecalciferol (VITAMIN D3) 1000 UNIT tablet Take 1,000 Units by mouth daily.      . ciclopirox (PENLAC) 8 % solution Apply topically at bedtime. Apply over nail and surrounding skin. Apply daily over previous coat. After seven (7) days, may remove with alcohol and continue cycle. (Patient taking differently: Apply 1 application topically See admin instructions. Apply over nail and surrounding skin. Apply daily over previous coat. After seven (7) days, may remove with alcohol and continue cycle.) 6.6 mL 1  . clotrimazole-betamethasone (LOTRISONE) cream APPLY TO AFFECTED AREA 2 TIMES A DAY. (Patient taking differently: Apply 1 application topically 2 (two) times daily as needed (skin irritation). ) 45 g 1  . Cyanocobalamin (VITAMIN B-12 CR) 1000 MCG TBCR Take 1,000 mcg by mouth daily.      Marland Kitchen docusate sodium (COLACE) 100 MG capsule Take 1 capsule (100 mg total) by mouth 2 (two) times daily. 28 capsule 0  . dutasteride (AVODART) 0.5 MG capsule TAKE 1 CAPSULE BY MOUTH DAILY. (Patient taking differently: Take 0.5 mg by mouth daily. ) 30 capsule 11  . FLUoxetine (PROZAC) 40 MG capsule Take 1 capsule (40 mg total) by mouth daily. 90 capsule 3  . gabapentin  (NEURONTIN) 300 MG capsule TAKE 1 CAPSULE BY MOUTH THREE TIMES A DAY (Patient taking differently: Take 300 mg by mouth 3 (three) times daily. ) 90 capsule 11  . HYDROcodone-acetaminophen (NORCO) 5-325 MG tablet Take 1-2 tablets by mouth every 4 (four) hours as needed for moderate pain or severe pain. 60 tablet 0  . ketoconazole (NIZORAL) 2 % shampoo APPLY AS DIRECTED 2 TIMES A WEEK (Patient taking differently: Apply 1 application topically daily as needed (scalp irritation/dandruff). ) 120 mL 0  . morphine (MS CONTIN) 30 MG 12 hr tablet Take 1 tablet (30 mg total) by mouth every 12 (twelve) hours. 60 tablet 0  . oxyCODONE (ROXICODONE) 15 MG immediate release tablet oxycodone 15 mg tablet    . Polyethyl Glycol-Propyl Glycol (LUBRICANT EYE DROPS) 0.4-0.3 % SOLN Place 1 drop into both eyes 3 (three) times daily as needed (dry/irritated eyes.).    Marland Kitchen polyethylene glycol (MIRALAX / GLYCOLAX) 17 g packet Take 17 g by mouth 2 (two) times daily. 28 packet 0  . propranolol (INDERAL) 80 MG tablet TAKE 1 TABLET BY MOUTH 3 TIMES DAILY. (Patient taking differently: Take 80 mg by mouth 3 (three) times daily. ) 270 tablet 3  . testosterone cypionate (DEPOTESTOSTERONE CYPIONATE) 200 MG/ML injection INJECT 3 MILLILITERS INTO THE MUSCLE EVERY 14 DAYS (Patient taking differently: Inject 600 mg into the muscle every 14 (fourteen) days. ) 10 mL 5  . tiZANidine (ZANAFLEX)  4 MG tablet Take 1 tablet (4 mg total) by mouth every 8 (eight) hours as needed (spasms). TAKE 1 TABLET BY MOUTH EVERY 8 HOURS AS NEEDED FOR MUSCLE SPASMS. 40 tablet 0  . traZODone (DESYREL) 50 MG tablet TAKE 1 TO 2 TABLETS BY MOUTH AT BEDTIME. (Patient taking differently: Take 50-100 mg by mouth at bedtime as needed for sleep. ) 60 tablet 5  . ferrous sulfate (FERROUSUL) 325 (65 FE) MG tablet Take 1 tablet (325 mg total) by mouth 3 (three) times daily with meals for 14 days. 42 tablet 0   No facility-administered medications prior to visit.     ROS: Review of Systems  Constitutional: Positive for fatigue. Negative for appetite change and unexpected weight change.  HENT: Negative for congestion, nosebleeds, sneezing, sore throat and trouble swallowing.   Eyes: Negative for itching and visual disturbance.  Respiratory: Negative for cough.   Cardiovascular: Negative for chest pain, palpitations and leg swelling.  Gastrointestinal: Negative for abdominal distention, blood in stool, diarrhea and nausea.  Genitourinary: Negative for frequency and hematuria.  Musculoskeletal: Positive for arthralgias, back pain and gait problem. Negative for joint swelling and neck pain.  Skin: Negative for rash.  Neurological: Negative for dizziness, tremors, speech difficulty and weakness.  Psychiatric/Behavioral: Negative for agitation, dysphoric mood and sleep disturbance. The patient is not nervous/anxious.     Objective:  BP (!) 132/56 (BP Location: Left Arm, Patient Position: Sitting, Cuff Size: Large)   Pulse (!) 48   Temp 98.3 F (36.8 C) (Oral)   Ht 6' (1.829 m)   Wt 201 lb (91.2 kg)   SpO2 98%   BMI 27.26 kg/m   BP Readings from Last 3 Encounters:  06/19/19 (!) 132/56  05/24/19 117/70  05/10/19 (!) 149/65    Wt Readings from Last 3 Encounters:  06/19/19 201 lb (91.2 kg)  05/23/19 197 lb 12.8 oz (89.7 kg)  05/10/19 201 lb (91.2 kg)    Physical Exam Constitutional:      General: He is not in acute distress.    Appearance: He is well-developed.     Comments: NAD  Eyes:     Conjunctiva/sclera: Conjunctivae normal.     Pupils: Pupils are equal, round, and reactive to light.  Neck:     Thyroid: No thyromegaly.     Vascular: No JVD.  Cardiovascular:     Rate and Rhythm: Normal rate and regular rhythm.     Heart sounds: Normal heart sounds. No murmur. No friction rub. No gallop.   Pulmonary:     Effort: Pulmonary effort is normal. No respiratory distress.     Breath sounds: Normal breath sounds. No wheezing or rales.   Chest:     Chest wall: No tenderness.  Abdominal:     General: Bowel sounds are normal. There is no distension.     Palpations: Abdomen is soft. There is no mass.     Tenderness: There is no abdominal tenderness. There is no guarding or rebound.  Musculoskeletal:        General: Tenderness present. Normal range of motion.     Cervical back: Normal range of motion.  Lymphadenopathy:     Cervical: No cervical adenopathy.  Skin:    General: Skin is warm and dry.     Findings: No rash.  Neurological:     Mental Status: He is alert and oriented to person, place, and time.     Cranial Nerves: No cranial nerve deficit.     Motor:  No abnormal muscle tone.     Coordination: Coordination normal.     Gait: Gait normal.     Deep Tendon Reflexes: Reflexes are normal and symmetric.  Psychiatric:        Behavior: Behavior normal.        Thought Content: Thought content normal.        Judgment: Judgment normal.    Cane Ataxic L hip tender post-op Lab Results  Component Value Date   WBC 9.5 05/24/2019   HGB 10.0 (L) 05/24/2019   HCT 30.5 (L) 05/24/2019   PLT 159 05/24/2019   GLUCOSE 108 (H) 05/24/2019   CHOL 133 10/19/2018   TRIG 74.0 10/19/2018   HDL 36.90 (L) 10/19/2018   LDLCALC 81 10/19/2018   ALT 31 10/19/2018   AST 22 10/19/2018   NA 135 05/24/2019   K 3.7 05/24/2019   CL 103 05/24/2019   CREATININE 0.75 05/24/2019   BUN 12 05/24/2019   CO2 25 05/24/2019   TSH 1.40 04/05/2019   PSA 1.01 10/19/2018   INR 1.00 03/07/2013   HGBA1C 4.9 05/10/2019    No results found.  Assessment & Plan:    Walker Kehr, MD

## 2019-06-19 NOTE — Assessment & Plan Note (Signed)
On B12 

## 2019-06-19 NOTE — Assessment & Plan Note (Signed)
F/u w/Dr Plovsky 

## 2019-06-19 NOTE — Assessment & Plan Note (Signed)
S/p THR L - Dr Alvan Dame - better

## 2019-06-19 NOTE — Assessment & Plan Note (Signed)
MS-contin, Tizanidine Chronic Potential benefits of a long term opioids use as well as potential risks (i.e. addiction risk, apnea etc) and complications (i.e. Somnolence, constipation and others) were explained to the patient and were aknowledged.

## 2019-06-20 ENCOUNTER — Other Ambulatory Visit: Payer: Self-pay | Admitting: Internal Medicine

## 2019-06-20 NOTE — Progress Notes (Signed)
No prior auth required based on members plan HTA

## 2019-06-23 DIAGNOSIS — H18413 Arcus senilis, bilateral: Secondary | ICD-10-CM | POA: Diagnosis not present

## 2019-06-23 DIAGNOSIS — H04123 Dry eye syndrome of bilateral lacrimal glands: Secondary | ICD-10-CM | POA: Diagnosis not present

## 2019-06-23 DIAGNOSIS — Z961 Presence of intraocular lens: Secondary | ICD-10-CM | POA: Diagnosis not present

## 2019-06-23 DIAGNOSIS — H26491 Other secondary cataract, right eye: Secondary | ICD-10-CM | POA: Diagnosis not present

## 2019-07-01 ENCOUNTER — Other Ambulatory Visit: Payer: Self-pay | Admitting: Internal Medicine

## 2019-07-03 ENCOUNTER — Other Ambulatory Visit: Payer: Self-pay | Admitting: Internal Medicine

## 2019-07-05 DIAGNOSIS — Z96642 Presence of left artificial hip joint: Secondary | ICD-10-CM | POA: Diagnosis not present

## 2019-07-05 DIAGNOSIS — Z471 Aftercare following joint replacement surgery: Secondary | ICD-10-CM | POA: Diagnosis not present

## 2019-07-10 ENCOUNTER — Other Ambulatory Visit: Payer: Self-pay | Admitting: Internal Medicine

## 2019-07-12 MED ORDER — TESTOSTERONE CYPIONATE 200 MG/ML IM SOLN: INTRAMUSCULAR | 5 refills | Status: DC

## 2019-08-24 DIAGNOSIS — Q7649 Other congenital malformations of spine, not associated with scoliosis: Secondary | ICD-10-CM | POA: Diagnosis not present

## 2019-08-24 DIAGNOSIS — M5116 Intervertebral disc disorders with radiculopathy, lumbar region: Secondary | ICD-10-CM | POA: Diagnosis not present

## 2019-08-24 DIAGNOSIS — M5114 Intervertebral disc disorders with radiculopathy, thoracic region: Secondary | ICD-10-CM | POA: Diagnosis not present

## 2019-08-24 DIAGNOSIS — M5117 Intervertebral disc disorders with radiculopathy, lumbosacral region: Secondary | ICD-10-CM | POA: Diagnosis not present

## 2019-09-01 ENCOUNTER — Telehealth: Payer: Self-pay | Admitting: Neurology

## 2019-09-01 NOTE — Telephone Encounter (Signed)
Contacted [facility], done last Thursday. Fax to come

## 2019-09-01 NOTE — Telephone Encounter (Signed)
Patient's wife called in to get results of MRI.

## 2019-09-01 NOTE — Telephone Encounter (Signed)
I do not see results are back as of yet.

## 2019-09-04 ENCOUNTER — Telehealth: Payer: Self-pay

## 2019-09-04 NOTE — Telephone Encounter (Signed)
No answer at  11:33

## 2019-09-04 NOTE — Telephone Encounter (Signed)
Patient notified of restults,he will contact insurance and let us know what he would like to do.

## 2019-09-04 NOTE — Telephone Encounter (Signed)
-----   Message from Venetia Night, [place] sent at 09/04/2019 11:22 AM EDT -----  ----- Message ----- From: Pieter Partridge, DO Sent: 09/01/2019   7:05 AM EDT To: [facility] Clinical Pool  Reviewed MRI results of Dr. Serita Grit patient.  He has significant degenerative changes in the lower spine affecting his nerves that is like contributing to his weakness.  I would recommend physical therapy if not already ordered.  Only other option is referral to spine specialist.

## 2019-09-04 NOTE — Telephone Encounter (Signed)
Patient called stating he was returning a call about results from earlier today.

## 2019-09-04 NOTE — Telephone Encounter (Signed)
Patient notified

## 2019-09-19 ENCOUNTER — Ambulatory Visit (INDEPENDENT_AMBULATORY_CARE_PROVIDER_SITE_OTHER): Payer: PPO | Admitting: Internal Medicine

## 2019-09-19 ENCOUNTER — Encounter: Payer: Self-pay | Admitting: Internal Medicine

## 2019-09-19 ENCOUNTER — Other Ambulatory Visit: Payer: Self-pay

## 2019-09-19 VITALS — BP 160/82 | HR 57 | Temp 98.9°F | Ht 72.0 in | Wt 205.0 lb

## 2019-09-19 DIAGNOSIS — Z01818 Encounter for other preprocedural examination: Secondary | ICD-10-CM

## 2019-09-19 DIAGNOSIS — R29898 Other symptoms and signs involving the musculoskeletal system: Secondary | ICD-10-CM

## 2019-09-19 DIAGNOSIS — M5440 Lumbago with sciatica, unspecified side: Secondary | ICD-10-CM | POA: Diagnosis not present

## 2019-09-19 DIAGNOSIS — E538 Deficiency of other specified B group vitamins: Secondary | ICD-10-CM

## 2019-09-19 DIAGNOSIS — I10 Essential (primary) hypertension: Secondary | ICD-10-CM

## 2019-09-19 DIAGNOSIS — M1611 Unilateral primary osteoarthritis, right hip: Secondary | ICD-10-CM

## 2019-09-19 MED ORDER — MORPHINE SULFATE ER 30 MG PO TBCR: 30.0000 mg | EXTENDED_RELEASE_TABLET | Freq: Two times a day (BID) | ORAL | 0 refills | Status: DC

## 2019-09-19 NOTE — Assessment & Plan Note (Signed)
BP Readings from Last 3 Encounters:  09/19/19 (!) 160/82  06/19/19 (!) 132/56  05/24/19 117/70

## 2019-09-19 NOTE — Assessment & Plan Note (Signed)
s/p L THR w/Dr Alvan Dame 05/23/19  Per Dr Posey Pronto - " He has significant degenerative changes in the lower spine affecting his nerves that is like contributing to his weakness, leg pain.  I would recommend physical therapy if not already ordered.  Only other option is referral to spine specialist. "  Ref to Dr Nelva Bush

## 2019-09-19 NOTE — Progress Notes (Signed)
Subjective:  Patient ID: Vernon Bishop, male    DOB: [birth year]  Age: 79 y.o. MRN: [id number]  CC: No chief complaint on file.   HPI   IM consult Reason planned L TKA - spinal anesthesia Requested by Dr Alvan Dame  Hx: Vernon Bishop presents for chronic LBP, hip pain - s/p L THR w/Dr Alvan Dame 05/23/19. C/o L knee pain F/u HTN, depression, anxiety - stable  Past Medical History:  Diagnosis Date  . Atrial fibrillation ([place])    1992  . DDD (degenerative disc disease)   . DDD (degenerative disc disease)   . Depression   . Diabetes mellitus    type II  - PT STATES NOT ON ANY DIABETIC MEDICINES  . Dysrhythmia    aFIB  . ED (erectile dysfunction)   . Hyperlipidemia   . Hypertension   . Hypogonadism male   . LBP (low back pain)   . MVP (mitral valve prolapse)   . OSA on CPAP    cpap  . Osteoarthritis   . Osteoarthritis, hand    SEVERE LOWER BACK PAIN, AND RESTLESS LEG SYNDROME  . Palpitations   . PTSD (post-traumatic stress disorder)   . Swallowing problem    HX OF PILLS GETTING "STUCK" IN THROAT AT TIMES  . Vitamin D deficiency    Past Surgical History:  Procedure Laterality Date  . APPENDECTOMY    . BACK SURGERY  1987  . CATARACT EXTRACTION    . CERVICAL LAMINECTOMY  2004   [name], rod was placed- HAS ROM LIMITATIONS  . CHOLECYSTECTOMY    . COLONOSCOPY    . KNEE SURGERY     left knee/arthroscopic  . TOTAL HIP ARTHROPLASTY Right 03/14/2013   Procedure: RIGHT TOTAL HIP ARTHROPLASTY ANTERIOR APPROACH;  Surgeon: [name], MD;  Location: [facility];  Service: Orthopedics;  Laterality: Right;  . TOTAL HIP ARTHROPLASTY Left 05/23/2019   Procedure: TOTAL HIP ARTHROPLASTY ANTERIOR APPROACH;  Surgeon: Paralee Cancel, MD;  Location: [facility];  Service: Orthopedics;  Laterality: Left;  70 mins    reports that he quit smoking about 48 years ago. His smoking use included cigarettes. He has a 6.00 pack-year smoking history. He has never used smokeless tobacco. He reports current  alcohol use of about 2.0 standard drinks of alcohol per week. He reports that he does not use drugs. family history includes Allergies in his mother; Coronary artery disease in an other family member; Diabetes in an other family member; Esophageal cancer in his maternal aunt; Heart attack (age of onset: 80) in his father; Heart disease in his brother, father, and mother; Hypertension in an other family member; Other in his paternal grandmother; Rheum arthritis in his paternal grandmother; Stroke in his mother. Allergies  Allergen Reactions  . Abilify [Aripiprazole]     SOB feeling  . Cymbalta [Duloxetine Hcl]     constipation  . Digoxin And Related     HR 41  . Duloxetine     REACTION: constipation  . Prednisolone     Edema, wt gain  . Temazepam      Outpatient Medications Prior to Visit  Medication Sig Dispense Refill  . amLODipine (NORVASC) 10 MG tablet TAKE 1 TABLET BY MOUTH EVERY MORNING. 90 tablet 3  . Cholecalciferol (VITAMIN D3) 1000 UNIT tablet Take 1,000 Units by mouth daily.      . ciclopirox (PENLAC) 8 % solution Apply topically at bedtime. Apply over nail and surrounding skin. Apply daily over previous coat. After  seven (7) days, may remove with alcohol and continue cycle. (Patient taking differently: Apply 1 application topically See admin instructions. Apply over nail and surrounding skin. Apply daily over previous coat. After seven (7) days, may remove with alcohol and continue cycle.) 6.6 mL 1  . clotrimazole-betamethasone (LOTRISONE) cream APPLY TO AFFECTED AREA 2 TIMES A DAY. 45 g 1  . Cyanocobalamin (VITAMIN B-12 CR) 1000 MCG TBCR Take 1,000 mcg by mouth daily.      Marland Kitchen docusate sodium (COLACE) 100 MG capsule Take 1 capsule (100 mg total) by mouth 2 (two) times daily. 28 capsule 0  . dutasteride (AVODART) 0.5 MG capsule TAKE 1 CAPSULE BY MOUTH DAILY. 30 capsule 5  . FLUoxetine (PROZAC) 40 MG capsule TAKE 1 CAPSULE BY MOUTH DAILY. 90 capsule 3  . gabapentin (NEURONTIN) 300  MG capsule TAKE 1 CAPSULE BY MOUTH THREE TIMES A DAY (Patient taking differently: Take 300 mg by mouth 3 (three) times daily. ) 90 capsule 11  . ketoconazole (NIZORAL) 2 % shampoo APPLY AS DIRECTED 2 TIMES A WEEK (Patient taking differently: Apply 1 application topically daily as needed (scalp irritation/dandruff). ) 120 mL 0  . morphine (MS CONTIN) 30 MG 12 hr tablet Take 1 tablet (30 mg total) by mouth every 12 (twelve) hours. 60 tablet 0  . morphine (MS CONTIN) 30 MG 12 hr tablet Take 1 tablet (30 mg total) by mouth every 12 (twelve) hours. 60 tablet 0  . morphine (MS CONTIN) 30 MG 12 hr tablet Take 1 tablet (30 mg total) by mouth every 12 (twelve) hours. 60 tablet 0  . Polyethyl Glycol-Propyl Glycol (LUBRICANT EYE DROPS) 0.4-0.3 % SOLN Place 1 drop into both eyes 3 (three) times daily as needed (dry/irritated eyes.).    Marland Kitchen polyethylene glycol (MIRALAX / GLYCOLAX) 17 g packet Take 17 g by mouth 2 (two) times daily. 28 packet 0  . propranolol (INDERAL) 80 MG tablet TAKE 1 TABLET BY MOUTH 3 TIMES DAILY. (Patient taking differently: Take 80 mg by mouth 3 (three) times daily. ) 270 tablet 3  . testosterone cypionate (DEPOTESTOSTERONE CYPIONATE) 200 MG/ML injection INJECT 3 MILLILITERS INTO THE MUSCLE EVERY 14 DAYS 10 mL 5  . tiZANidine (ZANAFLEX) 4 MG tablet Take 1 tablet (4 mg total) by mouth every 8 (eight) hours as needed (spasms). TAKE 1 TABLET BY MOUTH EVERY 8 HOURS AS NEEDED FOR MUSCLE SPASMS. 40 tablet 0  . traZODone (DESYREL) 50 MG tablet TAKE 1 TO 2 TABLETS BY MOUTH AT BEDTIME. (Patient taking differently: Take 50-100 mg by mouth at bedtime as needed for sleep. ) 60 tablet 5  . buPROPion (WELLBUTRIN XL) 150 MG 24 hr tablet TAKE 3 TABLETS BY MOUTH EVERY MORNING (Patient not taking: Reported on 09/19/2019) 90 tablet 3  . ferrous sulfate (FERROUSUL) 325 (65 FE) MG tablet Take 1 tablet (325 mg total) by mouth 3 (three) times daily with meals for 14 days. 42 tablet 0  . FLUoxetine (PROZAC) 20 MG  capsule Take 20 mg by mouth daily.     No facility-administered medications prior to visit.    ROS: Review of Systems  Constitutional: Positive for fatigue. Negative for appetite change and unexpected weight change.  HENT: Negative for congestion, nosebleeds, sneezing, sore throat and trouble swallowing.   Eyes: Negative for itching and visual disturbance.  Respiratory: Negative for cough.   Cardiovascular: Negative for chest pain, palpitations and leg swelling.  Gastrointestinal: Negative for abdominal distention, blood in stool, diarrhea and nausea.  Genitourinary: Negative for frequency  and hematuria.  Musculoskeletal: Positive for back pain and gait problem. Negative for joint swelling and neck pain.  Skin: Negative for rash.  Neurological: Positive for weakness. Negative for dizziness, tremors and speech difficulty.  Psychiatric/Behavioral: Positive for dysphoric mood. Negative for agitation, self-injury, sleep disturbance and suicidal ideas. The patient is not nervous/anxious.     Objective:  BP (!) 160/82 (BP Location: Right Arm, Patient Position: Sitting, Cuff Size: Normal)   Pulse (!) 57   Temp 98.9 F (37.2 C) (Oral)   Ht 6' (1.829 m)   Wt 205 lb (93 kg)   SpO2 98%   BMI 27.80 kg/m   BP Readings from Last 3 Encounters:  09/19/19 (!) 160/82  06/19/19 (!) 132/56  05/24/19 117/70    Wt Readings from Last 3 Encounters:  09/19/19 205 lb (93 kg)  06/19/19 201 lb (91.2 kg)  05/23/19 197 lb 12.8 oz (89.7 kg)    Physical Exam Constitutional:      General: He is not in acute distress.    Appearance: He is well-developed.     Comments: NAD  Eyes:     Conjunctiva/sclera: Conjunctivae normal.     Pupils: Pupils are equal, round, and reactive to light.  Neck:     Thyroid: No thyromegaly.     Vascular: No JVD.  Cardiovascular:     Rate and Rhythm: Normal rate and regular rhythm.     Heart sounds: Normal heart sounds. No murmur heard.  No friction rub. No gallop.     Pulmonary:     Effort: Pulmonary effort is normal. No respiratory distress.     Breath sounds: Normal breath sounds. No wheezing or rales.  Chest:     Chest wall: No tenderness.  Abdominal:     General: Bowel sounds are normal. There is no distension.     Palpations: Abdomen is soft. There is no mass.     Tenderness: There is no abdominal tenderness. There is no guarding or rebound.  Musculoskeletal:        General: Tenderness present. Normal range of motion.     Cervical back: Normal range of motion.  Lymphadenopathy:     Cervical: No cervical adenopathy.  Skin:    General: Skin is warm and dry.     Findings: No rash.  Neurological:     Mental Status: He is alert and oriented to person, place, and time.     Cranial Nerves: No cranial nerve deficit.     Motor: No abnormal muscle tone.     Coordination: Coordination normal.     Gait: Gait abnormal.     Deep Tendon Reflexes: Reflexes are normal and symmetric.  Psychiatric:        Behavior: Behavior normal.        Thought Content: Thought content normal.        Judgment: Judgment normal.    LS, L hip w/pain Cane, limping   Lab Results  Component Value Date   WBC 9.5 05/24/2019   HGB 10.0 (L) 05/24/2019   HCT 30.5 (L) 05/24/2019   PLT 159 05/24/2019   GLUCOSE 108 (H) 05/24/2019   CHOL 133 10/19/2018   TRIG 74.0 10/19/2018   HDL 36.90 (L) 10/19/2018   LDLCALC 81 10/19/2018   ALT 31 10/19/2018   AST 22 10/19/2018   NA 135 05/24/2019   K 3.7 05/24/2019   CL 103 05/24/2019   CREATININE 0.75 05/24/2019   BUN 12 05/24/2019   CO2 25 05/24/2019  TSH 1.40 04/05/2019   PSA 1.01 10/19/2018   INR 1.00 03/07/2013   HGBA1C 4.9 05/10/2019    No results found.  Assessment & Plan:    Walker Kehr, MD

## 2019-09-19 NOTE — Assessment & Plan Note (Signed)
s/p L THR w/Dr Alvan Dame 05/23/19  Per Dr Posey Pronto - " He has significant degenerative changes in the lower spine affecting his nerves that is like contributing to his weakness, leg pain.  I would recommend physical therapy if not already ordered.  Only other option is referral to spine specialist. "  Ref to Dr Nelva Bush  MS contin  Potential benefits of a long term opioids use as well as potential risks (i.e. addiction risk, apnea etc) and complications (i.e. Somnolence, constipation and others) were explained to the patient and were aknowledged.

## 2019-09-19 NOTE — Assessment & Plan Note (Addendum)
s/p L THR w/Dr Alvan Dame 05/23/19   He has significant degenerative changes in the lower spine affecting his nerves that is like contributing to his weakness, leg pain.  I would recommend physical therapy if not already ordered.  Only other option is referral to spine specialist.   Ref to Dr Nelva Bush

## 2019-09-20 DIAGNOSIS — M25562 Pain in left knee: Secondary | ICD-10-CM | POA: Diagnosis not present

## 2019-09-20 DIAGNOSIS — Z471 Aftercare following joint replacement surgery: Secondary | ICD-10-CM | POA: Diagnosis not present

## 2019-09-20 DIAGNOSIS — Z96642 Presence of left artificial hip joint: Secondary | ICD-10-CM | POA: Diagnosis not present

## 2019-09-20 DIAGNOSIS — M25552 Pain in left hip: Secondary | ICD-10-CM | POA: Diagnosis not present

## 2019-09-20 DIAGNOSIS — M545 Low back pain: Secondary | ICD-10-CM | POA: Diagnosis not present

## 2019-09-20 DIAGNOSIS — M1712 Unilateral primary osteoarthritis, left knee: Secondary | ICD-10-CM | POA: Diagnosis not present

## 2019-09-21 ENCOUNTER — Other Ambulatory Visit: Payer: Self-pay | Admitting: Internal Medicine

## 2019-09-25 ENCOUNTER — Telehealth: Payer: Self-pay

## 2019-09-25 NOTE — Telephone Encounter (Signed)
New message   Patient calling   Last office visit on  6.22.21   Upcoming surgery on  7.20.21  Need cardiac clearance

## 2019-09-27 DIAGNOSIS — M48062 Spinal stenosis, lumbar region with neurogenic claudication: Secondary | ICD-10-CM | POA: Diagnosis not present

## 2019-09-28 NOTE — Assessment & Plan Note (Signed)
The patient is medically clear for his planned left TKA under spinal anesthesia.  His perioperative risk for complications is average.  Please follow your routine perioperative protocols as usual.  Thank you

## 2019-09-28 NOTE — Assessment & Plan Note (Signed)
On B12 

## 2019-09-29 NOTE — Telephone Encounter (Signed)
Done. Thx.

## 2019-09-29 NOTE — Telephone Encounter (Signed)
Pt's wife informed of below.  

## 2019-10-05 DIAGNOSIS — M5416 Radiculopathy, lumbar region: Secondary | ICD-10-CM | POA: Diagnosis not present

## 2019-10-05 DIAGNOSIS — M545 Low back pain: Secondary | ICD-10-CM | POA: Diagnosis not present

## 2019-10-09 ENCOUNTER — Encounter (HOSPITAL_COMMUNITY): Payer: PPO

## 2019-10-09 DIAGNOSIS — M5416 Radiculopathy, lumbar region: Secondary | ICD-10-CM | POA: Diagnosis not present

## 2019-10-17 ENCOUNTER — Ambulatory Visit: Admit: 2019-10-17 | Payer: PPO | Admitting: Orthopedic Surgery

## 2019-10-17 SURGERY — ARTHROPLASTY, KNEE, TOTAL
Anesthesia: Spinal | Site: Knee | Laterality: Left

## 2019-10-19 DIAGNOSIS — M5136 Other intervertebral disc degeneration, lumbar region: Secondary | ICD-10-CM | POA: Diagnosis not present

## 2019-10-23 DIAGNOSIS — M5416 Radiculopathy, lumbar region: Secondary | ICD-10-CM | POA: Diagnosis not present

## 2019-10-31 ENCOUNTER — Other Ambulatory Visit: Payer: Self-pay | Admitting: Internal Medicine

## 2019-11-09 DIAGNOSIS — Z79891 Long term (current) use of opiate analgesic: Secondary | ICD-10-CM | POA: Diagnosis not present

## 2019-11-13 DIAGNOSIS — M5416 Radiculopathy, lumbar region: Secondary | ICD-10-CM | POA: Diagnosis not present

## 2019-11-20 DIAGNOSIS — M5416 Radiculopathy, lumbar region: Secondary | ICD-10-CM | POA: Diagnosis not present

## 2019-11-27 DIAGNOSIS — M5416 Radiculopathy, lumbar region: Secondary | ICD-10-CM | POA: Diagnosis not present

## 2019-12-05 DIAGNOSIS — M5416 Radiculopathy, lumbar region: Secondary | ICD-10-CM | POA: Diagnosis not present

## 2019-12-07 DIAGNOSIS — M546 Pain in thoracic spine: Secondary | ICD-10-CM | POA: Diagnosis not present

## 2019-12-07 DIAGNOSIS — G894 Chronic pain syndrome: Secondary | ICD-10-CM | POA: Diagnosis not present

## 2019-12-19 DIAGNOSIS — M5134 Other intervertebral disc degeneration, thoracic region: Secondary | ICD-10-CM | POA: Diagnosis not present

## 2019-12-20 ENCOUNTER — Encounter: Payer: Self-pay | Admitting: Internal Medicine

## 2019-12-20 ENCOUNTER — Ambulatory Visit (INDEPENDENT_AMBULATORY_CARE_PROVIDER_SITE_OTHER): Payer: PPO | Admitting: Internal Medicine

## 2019-12-20 ENCOUNTER — Other Ambulatory Visit: Payer: Self-pay

## 2019-12-20 VITALS — BP 126/82 | HR 59 | Temp 98.6°F | Ht 72.0 in | Wt 223.0 lb

## 2019-12-20 DIAGNOSIS — F339 Major depressive disorder, recurrent, unspecified: Secondary | ICD-10-CM

## 2019-12-20 DIAGNOSIS — G4700 Insomnia, unspecified: Secondary | ICD-10-CM

## 2019-12-20 DIAGNOSIS — R609 Edema, unspecified: Secondary | ICD-10-CM | POA: Diagnosis not present

## 2019-12-20 DIAGNOSIS — M5440 Lumbago with sciatica, unspecified side: Secondary | ICD-10-CM

## 2019-12-20 DIAGNOSIS — N138 Other obstructive and reflux uropathy: Secondary | ICD-10-CM

## 2019-12-20 DIAGNOSIS — Z23 Encounter for immunization: Secondary | ICD-10-CM

## 2019-12-20 DIAGNOSIS — I480 Paroxysmal atrial fibrillation: Secondary | ICD-10-CM | POA: Diagnosis not present

## 2019-12-20 DIAGNOSIS — E119 Type 2 diabetes mellitus without complications: Secondary | ICD-10-CM | POA: Diagnosis not present

## 2019-12-20 DIAGNOSIS — I10 Essential (primary) hypertension: Secondary | ICD-10-CM

## 2019-12-20 DIAGNOSIS — N401 Enlarged prostate with lower urinary tract symptoms: Secondary | ICD-10-CM | POA: Diagnosis not present

## 2019-12-20 MED ORDER — AMLODIPINE BESYLATE 10 MG PO TABS
5.0000 mg | ORAL_TABLET | Freq: Every morning | ORAL | 3 refills | Status: DC
Start: 2019-12-20 — End: 2020-08-11

## 2019-12-20 MED ORDER — DUTASTERIDE 0.5 MG PO CAPS: 0.5000 mg | ORAL_CAPSULE | Freq: Every day | ORAL | 11 refills | Status: DC

## 2019-12-20 NOTE — Addendum Note (Signed)
Addended by: Cresenciano Lick on: 12/20/2019 03:16 PM   Modules accepted: Orders

## 2019-12-20 NOTE — Assessment & Plan Note (Signed)
On Fluoxetine 40 mg  Wellbutrin XL 450 mg/d -  Pt d/c'd - not covered

## 2019-12-20 NOTE — Progress Notes (Signed)
Subjective:  Patient ID: Vernon Bishop, male    DOB: 1940-10-19  Age: 79 y.o. MRN: [id number]  CC: No chief complaint on file.   HPI [name] presents for LBP - Dr Nelva Bush will prescribe pain meds  F/u HTN, depression. Pt lost wt on diet - lost 47 and gained 23 lbs again  Outpatient Medications Prior to Visit  Medication Sig Dispense Refill  . amLODipine (NORVASC) 10 MG tablet TAKE 1 TABLET BY MOUTH EVERY MORNING. 90 tablet 3  . Cholecalciferol (VITAMIN D3) 1000 UNIT tablet Take 1,000 Units by mouth daily.      . ciclopirox (PENLAC) 8 % solution Apply topically at bedtime. Apply over nail and surrounding skin. Apply daily over previous coat. After seven (7) days, may remove with alcohol and continue cycle. (Patient taking differently: Apply 1 application topically See admin instructions. Apply over nail and surrounding skin. Apply daily over previous coat. After seven (7) days, may remove with alcohol and continue cycle.) 6.6 mL 1  . clotrimazole-betamethasone (LOTRISONE) cream APPLY TO AFFECTED AREA 2 TIMES A DAY. 45 g 1  . Cyanocobalamin (VITAMIN B-12 CR) 1000 MCG TBCR Take 1,000 mcg by mouth daily.      Marland Kitchen docusate sodium (COLACE) 100 MG capsule Take 1 capsule (100 mg total) by mouth 2 (two) times daily. 28 capsule 0  . dutasteride (AVODART) 0.5 MG capsule TAKE 1 CAPSULE BY MOUTH DAILY. 30 capsule 5  . FLUoxetine (PROZAC) 40 MG capsule TAKE 1 CAPSULE BY MOUTH DAILY. 90 capsule 3  . gabapentin (NEURONTIN) 300 MG capsule TAKE 1 CAPSULE BY MOUTH THREE TIMES A DAY 90 capsule 11  . ketoconazole (NIZORAL) 2 % shampoo APPLY AS DIRECTED 2 TIMES A WEEK (Patient taking differently: Apply 1 application topically daily as needed (scalp irritation/dandruff). ) 120 mL 0  . morphine (MS CONTIN) 30 MG 12 hr tablet Take 1 tablet (30 mg total) by mouth every 12 (twelve) hours. 60 tablet 0  . morphine (MS CONTIN) 30 MG 12 hr tablet Take 1 tablet (30 mg total) by mouth every 12 (twelve) hours. 60  tablet 0  . morphine (MS CONTIN) 30 MG 12 hr tablet Take 1 tablet (30 mg total) by mouth every 12 (twelve) hours. 60 tablet 0  . Polyethyl Glycol-Propyl Glycol (LUBRICANT EYE DROPS) 0.4-0.3 % SOLN Place 1 drop into both eyes 3 (three) times daily as needed (dry/irritated eyes.).    Marland Kitchen polyethylene glycol (MIRALAX / GLYCOLAX) 17 g packet Take 17 g by mouth 2 (two) times daily. 28 packet 0  . propranolol (INDERAL) 80 MG tablet TAKE 1 TABLET BY MOUTH 3 TIMES DAILY. 270 tablet 3  . testosterone cypionate (DEPOTESTOSTERONE CYPIONATE) 200 MG/ML injection INJECT 3 MILLILITERS INTO THE MUSCLE EVERY 14 DAYS 10 mL 5  . tiZANidine (ZANAFLEX) 4 MG tablet Take 1 tablet (4 mg total) by mouth every 8 (eight) hours as needed (spasms). TAKE 1 TABLET BY MOUTH EVERY 8 HOURS AS NEEDED FOR MUSCLE SPASMS. 40 tablet 0  . traZODone (DESYREL) 50 MG tablet TAKE 1 TO 2 TABLETS BY MOUTH AT BEDTIME. 60 tablet 5  . buPROPion (WELLBUTRIN XL) 150 MG 24 hr tablet TAKE 3 TABLETS BY MOUTH EVERY MORNING (Patient not taking: Reported on 12/20/2019) 90 tablet 3  . ferrous sulfate (FERROUSUL) 325 (65 FE) MG tablet Take 1 tablet (325 mg total) by mouth 3 (three) times daily with meals for 14 days. 42 tablet 0  . FLUoxetine (PROZAC) 20 MG capsule Take 20 mg  by mouth daily. (Patient not taking: Reported on 12/20/2019)     No facility-administered medications prior to visit.    ROS: Review of Systems  Constitutional: Negative for appetite change, fatigue and unexpected weight change.  HENT: Negative for congestion, nosebleeds, sneezing, sore throat and trouble swallowing.   Eyes: Negative for itching and visual disturbance.  Respiratory: Negative for cough.   Cardiovascular: Negative for chest pain, palpitations and leg swelling.  Gastrointestinal: Negative for abdominal distention, blood in stool, diarrhea and nausea.  Genitourinary: Negative for frequency and hematuria.  Musculoskeletal: Positive for back pain and gait problem.  Negative for joint swelling and neck pain.  Skin: Negative for rash.  Neurological: Negative for dizziness, tremors, speech difficulty and weakness.  Psychiatric/Behavioral: Positive for decreased concentration and dysphoric mood. Negative for agitation and sleep disturbance. The patient is nervous/anxious.     Objective:  BP 126/82   Pulse (!) 59   Temp 98.6 F (37 C) (Oral)   Ht 6' (1.829 m)   Wt 223 lb (101.2 kg)   SpO2 98%   BMI 30.24 kg/m   BP Readings from Last 3 Encounters:  12/20/19 126/82  09/19/19 (!) 160/82  06/19/19 (!) 132/56    Wt Readings from Last 3 Encounters:  12/20/19 223 lb (101.2 kg)  09/19/19 205 lb (93 kg)  06/19/19 201 lb (91.2 kg)    Physical Exam Constitutional:      General: He is not in acute distress.    Appearance: He is well-developed.     Comments: NAD  Eyes:     Conjunctiva/sclera: Conjunctivae normal.     Pupils: Pupils are equal, round, and reactive to light.  Neck:     Thyroid: No thyromegaly.     Vascular: No JVD.  Cardiovascular:     Rate and Rhythm: Normal rate and regular rhythm.     Heart sounds: Normal heart sounds. No murmur heard.  No friction rub. No gallop.   Pulmonary:     Effort: Pulmonary effort is normal. No respiratory distress.     Breath sounds: Normal breath sounds. No wheezing or rales.  Chest:     Chest wall: No tenderness.  Abdominal:     General: Bowel sounds are normal. There is no distension.     Palpations: Abdomen is soft. There is no mass.     Tenderness: There is no abdominal tenderness. There is no guarding or rebound.  Musculoskeletal:        General: Tenderness present. Normal range of motion.     Cervical back: Normal range of motion.  Lymphadenopathy:     Cervical: No cervical adenopathy.  Skin:    General: Skin is warm and dry.     Findings: No rash.  Neurological:     Mental Status: He is alert and oriented to person, place, and time.     Cranial Nerves: No cranial nerve deficit.      Motor: Weakness present. No abnormal muscle tone.     Coordination: Coordination normal.     Gait: Gait abnormal.     Deep Tendon Reflexes: Reflexes are normal and symmetric.  Psychiatric:        Behavior: Behavior normal.        Thought Content: Thought content normal.        Judgment: Judgment normal.   LS w/pain Ankle edema 1+   Lab Results  Component Value Date   WBC 9.5 05/24/2019   HGB 10.0 (L) 05/24/2019   HCT 30.5 (L)  05/24/2019   PLT 159 05/24/2019   GLUCOSE 108 (H) 05/24/2019   CHOL 133 10/19/2018   TRIG 74.0 10/19/2018   HDL 36.90 (L) 10/19/2018   LDLCALC 81 10/19/2018   ALT 31 10/19/2018   AST 22 10/19/2018   NA 135 05/24/2019   K 3.7 05/24/2019   CL 103 05/24/2019   CREATININE 0.75 05/24/2019   BUN 12 05/24/2019   CO2 25 05/24/2019   TSH 1.40 04/05/2019   PSA 1.01 10/19/2018   INR 1.00 03/07/2013   HGBA1C 4.9 05/10/2019    No results found.  Assessment & Plan:    Walker Kehr, MD

## 2019-12-20 NOTE — Assessment & Plan Note (Signed)
Pt lost wt on diet - lost 47  And gain 23 lbs again

## 2019-12-20 NOTE — Assessment & Plan Note (Signed)
ASA

## 2019-12-20 NOTE — Assessment & Plan Note (Signed)
Reduce Amlodipine to 5 mg/day due to ankle swelling

## 2019-12-20 NOTE — Patient Instructions (Signed)
Reduce Amlodipine to 5 mg/day due to ankle swelling

## 2019-12-20 NOTE — Addendum Note (Signed)
Addended by: Hinda Kehr on: 12/20/2019 03:12 PM   Modules accepted: Orders

## 2019-12-20 NOTE — Assessment & Plan Note (Signed)
Pt lost wt on diet - lost 47 and gained 23 lbs again Dr Nelva Bush will prescribe pain meds

## 2019-12-20 NOTE — Assessment & Plan Note (Signed)
Trazodone  °

## 2019-12-21 LAB — CBC WITH DIFFERENTIAL/PLATELET
Absolute Monocytes: 583 cells/uL (ref 200–950)
Basophils Absolute: 11 cells/uL (ref 0–200)
Basophils Relative: 0.1 %
Eosinophils Absolute: 0 cells/uL — ABNORMAL LOW (ref 15–500)
Eosinophils Relative: 0 %
HCT: 49.2 % (ref 38.5–50.0)
Hemoglobin: 16.1 g/dL (ref 13.2–17.1)
Lymphs Abs: 1166 cells/uL (ref 850–3900)
MCH: 29.5 pg (ref 27.0–33.0)
MCHC: 32.7 g/dL (ref 32.0–36.0)
MCV: 90.1 fL (ref 80.0–100.0)
MPV: 10.4 fL (ref 7.5–12.5)
Monocytes Relative: 5.5 %
Neutro Abs: 8840 cells/uL — ABNORMAL HIGH (ref 1500–7800)
Neutrophils Relative %: 83.4 %
Platelets: 227 10*3/uL (ref 140–400)
RBC: 5.46 10*6/uL (ref 4.20–5.80)
RDW: 13.3 % (ref 11.0–15.0)
Total Lymphocyte: 11 %
WBC: 10.6 10*3/uL (ref 3.8–10.8)

## 2019-12-21 LAB — COMPLETE METABOLIC PANEL WITH GFR
AG Ratio: 1.7 (calc) (ref 1.0–2.5)
ALT: 12 U/L (ref 9–46)
AST: 17 U/L (ref 10–35)
Albumin: 4.5 g/dL (ref 3.6–5.1)
Alkaline phosphatase (APISO): 80 U/L (ref 35–144)
BUN: 17 mg/dL (ref 7–25)
CO2: 27 mmol/L (ref 20–32)
Calcium: 9.4 mg/dL (ref 8.6–10.3)
Chloride: 101 mmol/L (ref 98–110)
Creat: 0.75 mg/dL (ref 0.70–1.18)
GFR, Est African American: 101 mL/min/{1.73_m2} (ref >=60)
GFR, Est Non African American: 87 mL/min/{1.73_m2} (ref >=60)
Globulin: 2.6 g/dL (calc) (ref 1.9–3.7)
Glucose, Bld: 85 mg/dL (ref 65–99)
Potassium: 3.8 mmol/L (ref 3.5–5.3)
Sodium: 137 mmol/L (ref 135–146)
Total Bilirubin: 0.7 mg/dL (ref 0.2–1.2)
Total Protein: 7.1 g/dL (ref 6.1–8.1)

## 2019-12-21 LAB — URINALYSIS
Bilirubin Urine: NEGATIVE
Glucose, UA: NEGATIVE
Hgb urine dipstick: NEGATIVE
Ketones, ur: NEGATIVE
Leukocytes,Ua: NEGATIVE
Nitrite: NEGATIVE
Protein, ur: NEGATIVE
Specific Gravity, Urine: 1.007 (ref 1.001–1.03)
pH: 7 (ref 5.0–8.0)

## 2019-12-21 LAB — TSH: TSH: 2.34 mIU/L (ref 0.40–4.50)

## 2019-12-21 LAB — HEMOGLOBIN A1C
Hgb A1c MFr Bld: 4.7 % of total Hgb (ref <5.7)
Mean Plasma Glucose: 88 (calc)
eAG (mmol/L): 4.9 (calc)

## 2019-12-21 LAB — MICROALBUMIN / CREATININE URINE RATIO
Creatinine, Urine: 29 mg/dL (ref 20–320)
Microalb Creat Ratio: 134 mcg/mg creat — ABNORMAL HIGH (ref <30)
Microalb, Ur: 3.9 mg/dL

## 2019-12-21 LAB — LIPID PANEL
Cholesterol: 178 mg/dL (ref <200)
HDL: 37 mg/dL — ABNORMAL LOW (ref >=40)
LDL Cholesterol (Calc): 120 mg/dL (calc) — ABNORMAL HIGH
Non-HDL Cholesterol (Calc): 141 mg/dL (calc) — ABNORMAL HIGH (ref <130)
Total CHOL/HDL Ratio: 4.8 (calc) (ref <5.0)
Triglycerides: 99 mg/dL (ref <150)

## 2019-12-21 LAB — PSA: PSA: 0.73 ng/mL (ref <=4.0)

## 2020-01-01 DIAGNOSIS — M542 Cervicalgia: Secondary | ICD-10-CM | POA: Diagnosis not present

## 2020-01-01 DIAGNOSIS — M546 Pain in thoracic spine: Secondary | ICD-10-CM | POA: Diagnosis not present

## 2020-02-26 ENCOUNTER — Other Ambulatory Visit: Payer: Self-pay | Admitting: Internal Medicine

## 2020-03-02 ENCOUNTER — Other Ambulatory Visit: Payer: Self-pay | Admitting: Internal Medicine

## 2020-04-01 DIAGNOSIS — Z5181 Encounter for therapeutic drug level monitoring: Secondary | ICD-10-CM | POA: Diagnosis not present

## 2020-04-01 DIAGNOSIS — Z79899 Other long term (current) drug therapy: Secondary | ICD-10-CM | POA: Diagnosis not present

## 2020-04-01 DIAGNOSIS — M5416 Radiculopathy, lumbar region: Secondary | ICD-10-CM | POA: Diagnosis not present

## 2020-04-13 DIAGNOSIS — M5416 Radiculopathy, lumbar region: Secondary | ICD-10-CM | POA: Diagnosis not present

## 2020-04-25 ENCOUNTER — Other Ambulatory Visit: Payer: Self-pay | Admitting: Internal Medicine

## 2020-04-30 DIAGNOSIS — M546 Pain in thoracic spine: Secondary | ICD-10-CM | POA: Diagnosis not present

## 2020-05-16 DIAGNOSIS — M5414 Radiculopathy, thoracic region: Secondary | ICD-10-CM | POA: Diagnosis not present

## 2020-05-30 DIAGNOSIS — G894 Chronic pain syndrome: Secondary | ICD-10-CM | POA: Diagnosis not present

## 2020-06-12 ENCOUNTER — Other Ambulatory Visit: Payer: Self-pay | Admitting: Internal Medicine

## 2020-06-19 ENCOUNTER — Ambulatory Visit: Payer: PPO | Admitting: Internal Medicine

## 2020-06-19 DIAGNOSIS — Z0289 Encounter for other administrative examinations: Secondary | ICD-10-CM

## 2020-07-26 ENCOUNTER — Other Ambulatory Visit: Payer: Self-pay | Admitting: Internal Medicine

## 2020-08-10 ENCOUNTER — Other Ambulatory Visit: Payer: Self-pay | Admitting: Internal Medicine

## 2020-08-21 ENCOUNTER — Ambulatory Visit: Payer: PPO | Admitting: Internal Medicine

## 2020-08-23 ENCOUNTER — Ambulatory Visit: Payer: PPO | Admitting: Internal Medicine

## 2020-08-30 ENCOUNTER — Encounter: Payer: Self-pay | Admitting: Internal Medicine

## 2020-08-30 ENCOUNTER — Ambulatory Visit (INDEPENDENT_AMBULATORY_CARE_PROVIDER_SITE_OTHER): Payer: PPO | Admitting: Internal Medicine

## 2020-08-30 ENCOUNTER — Other Ambulatory Visit: Payer: Self-pay

## 2020-08-30 VITALS — BP 160/90 | HR 49 | Temp 99.6°F | Ht 72.0 in | Wt 218.0 lb

## 2020-08-30 DIAGNOSIS — I10 Essential (primary) hypertension: Secondary | ICD-10-CM

## 2020-08-30 DIAGNOSIS — E785 Hyperlipidemia, unspecified: Secondary | ICD-10-CM

## 2020-08-30 DIAGNOSIS — R609 Edema, unspecified: Secondary | ICD-10-CM

## 2020-08-30 DIAGNOSIS — E538 Deficiency of other specified B group vitamins: Secondary | ICD-10-CM | POA: Diagnosis not present

## 2020-08-30 DIAGNOSIS — M5440 Lumbago with sciatica, unspecified side: Secondary | ICD-10-CM | POA: Diagnosis not present

## 2020-08-30 DIAGNOSIS — Z23 Encounter for immunization: Secondary | ICD-10-CM

## 2020-08-30 LAB — TSH: TSH: 3.59 u[IU]/mL (ref 0.35–4.50)

## 2020-08-30 MED ORDER — AMLODIPINE BESYLATE 5 MG PO TABS: 5.0000 mg | ORAL_TABLET | Freq: Every day | ORAL | 3 refills | Status: DC

## 2020-08-30 NOTE — Progress Notes (Signed)
Subjective:  Patient ID: Vernon Bishop, male    DOB: Mar 14, 1941  Age: 80 y.o. MRN: [id number]  CC: Follow-up (6 month check)   HPI Vernon Bishop presents for LBP, anxiety, hip pain f/u  Outpatient Medications Prior to Visit  Medication Sig Dispense Refill  . amLODipine (NORVASC) 10 MG tablet TAKE 1 TABLET BY MOUTH EVERY MORNING. 90 tablet 3  . Cholecalciferol (VITAMIN D3) 1000 UNIT tablet Take 1,000 Units by mouth daily.      . ciclopirox (PENLAC) 8 % solution APPLY OVER NAIL AND SURROUNDING SKIN. APPLY DAILY OVER PREVIOUS COAT. AFTER SEVEN (7) DAYS, MAY REMOVE WITH ALCOHOL AND AND CONTINUE CYCLE. 6.6 mL 1  . clotrimazole-betamethasone (LOTRISONE) cream APPLY TO AFFECTED AREA 2 TIMES A DAY. 45 g 1  . Cyanocobalamin (VITAMIN B-12 CR) 1000 MCG TBCR Take 1,000 mcg by mouth daily.      Marland Kitchen dutasteride (AVODART) 0.5 MG capsule Take 1 capsule (0.5 mg total) by mouth daily. 30 capsule 11  . FLUoxetine (PROZAC) 40 MG capsule TAKE 1 CAPSULE BY MOUTH DAILY. 90 capsule 2  . gabapentin (NEURONTIN) 300 MG capsule TAKE 1 CAPSULE BY MOUTH THREE TIMES A DAY 90 capsule 11  . morphine (MS CONTIN) 30 MG 12 hr tablet Take 1 tablet (30 mg total) by mouth every 12 (twelve) hours. 60 tablet 0  . morphine (MS CONTIN) 30 MG 12 hr tablet Take 1 tablet (30 mg total) by mouth every 12 (twelve) hours. 60 tablet 0  . morphine (MS CONTIN) 30 MG 12 hr tablet Take 1 tablet (30 mg total) by mouth every 12 (twelve) hours. 60 tablet 0  . Polyethyl Glycol-Propyl Glycol (LUBRICANT EYE DROPS) 0.4-0.3 % SOLN Place 1 drop into both eyes 3 (three) times daily as needed (dry/irritated eyes.).    Marland Kitchen polyethylene glycol (MIRALAX / GLYCOLAX) 17 g packet Take 17 g by mouth 2 (two) times daily. 28 packet 0  . propranolol (INDERAL) 80 MG tablet TAKE 1 TABLET BY MOUTH 3 TIMES DAILY. 270 tablet 3  . testosterone cypionate (DEPOTESTOSTERONE CYPIONATE) 200 MG/ML injection INJECT 3 MILLILITERS INTO THE MUSCLE EVERY 14 DAYS 10 mL 5  .  tiZANidine (ZANAFLEX) 4 MG tablet TAKE 1 TABLET BY MOUTH EVERY 8 HOURS AS NEEDED FOR MUSCLE SPASMS. 60 tablet 1  . traZODone (DESYREL) 50 MG tablet TAKE 1 TO 2 TABLETS BY MOUTH AT BEDTIME. 60 tablet 5  . docusate sodium (COLACE) 100 MG capsule Take 1 capsule (100 mg total) by mouth 2 (two) times daily. (Patient not taking: Reported on 08/30/2020) 28 capsule 0  . ferrous sulfate (FERROUSUL) 325 (65 FE) MG tablet Take 1 tablet (325 mg total) by mouth 3 (three) times daily with meals for 14 days. 42 tablet 0  . ketoconazole (NIZORAL) 2 % shampoo APPLY AS DIRECTED 2 TIMES A WEEK (Patient not taking: Reported on 08/30/2020) 120 mL 0   No facility-administered medications prior to visit.    ROS: Review of Systems  Constitutional: Positive for fatigue. Negative for appetite change and unexpected weight change.  HENT: Negative for congestion, nosebleeds, sneezing, sore throat and trouble swallowing.   Eyes: Negative for itching and visual disturbance.  Respiratory: Negative for cough.   Cardiovascular: Negative for chest pain, palpitations and leg swelling.  Gastrointestinal: Negative for abdominal distention, blood in stool, diarrhea and nausea.  Genitourinary: Negative for frequency and hematuria.  Musculoskeletal: Positive for arthralgias, back pain and gait problem. Negative for joint swelling and neck pain.  Skin: Negative for  rash.  Neurological: Negative for dizziness, tremors, speech difficulty and weakness.  Psychiatric/Behavioral: Negative for agitation, dysphoric mood, sleep disturbance and suicidal ideas. The patient is not nervous/anxious.     Objective:  BP (!) 160/90   Pulse (!) 49   Temp 99.6 F (37.6 C) (Oral)   Ht 6' (1.829 m)   Wt 218 lb (98.9 kg)   SpO2 99%   BMI 29.57 kg/m   BP Readings from Last 3 Encounters:  08/30/20 (!) 160/90  12/20/19 126/82  09/19/19 (!) 160/82    Wt Readings from Last 3 Encounters:  08/30/20 218 lb (98.9 kg)  12/20/19 223 lb (101.2 kg)   09/19/19 205 lb (93 kg)    Physical Exam Constitutional:      General: He is not in acute distress.    Appearance: He is well-developed. He is obese.     Comments: NAD  Eyes:     Conjunctiva/sclera: Conjunctivae normal.     Pupils: Pupils are equal, round, and reactive to light.  Neck:     Thyroid: No thyromegaly.     Vascular: No JVD.  Cardiovascular:     Rate and Rhythm: Normal rate and regular rhythm.     Heart sounds: Normal heart sounds. No murmur heard. No friction rub. No gallop.   Pulmonary:     Effort: Pulmonary effort is normal. No respiratory distress.     Breath sounds: Normal breath sounds. No wheezing or rales.  Chest:     Chest wall: No tenderness.  Abdominal:     General: Bowel sounds are normal. There is no distension.     Palpations: Abdomen is soft. There is no mass.     Tenderness: There is no abdominal tenderness. There is no guarding or rebound.  Musculoskeletal:        General: Tenderness present. Normal range of motion.     Cervical back: Normal range of motion.  Lymphadenopathy:     Cervical: No cervical adenopathy.  Skin:    General: Skin is warm and dry.     Findings: No rash.  Neurological:     Mental Status: He is alert and oriented to person, place, and time.     Cranial Nerves: No cranial nerve deficit.     Motor: No abnormal muscle tone.     Coordination: Coordination normal.     Gait: Gait normal.     Deep Tendon Reflexes: Reflexes are normal and symmetric.  Psychiatric:        Behavior: Behavior normal.        Thought Content: Thought content normal.        Judgment: Judgment normal.   LS spine w/pain Using a cane  Lab Results  Component Value Date   WBC 10.6 12/20/2019   HGB 16.1 12/20/2019   HCT 49.2 12/20/2019   PLT 227 12/20/2019   GLUCOSE 85 12/20/2019   CHOL 178 12/20/2019   TRIG 99 12/20/2019   HDL 37 (L) 12/20/2019   LDLCALC 120 (H) 12/20/2019   ALT 12 12/20/2019   AST 17 12/20/2019   NA 137 12/20/2019   K 3.8  12/20/2019   CL 101 12/20/2019   CREATININE 0.75 12/20/2019   BUN 17 12/20/2019   CO2 27 12/20/2019   TSH 2.34 12/20/2019   PSA 0.73 12/20/2019   INR 1.00 03/07/2013   HGBA1C 4.7 12/20/2019   MICROALBUR 3.9 12/20/2019    No results found.  Assessment & Plan:    Walker Kehr, MD

## 2020-08-30 NOTE — Addendum Note (Signed)
Addended by: Boris Lown B on: 08/30/2020 03:11 PM   Modules accepted: Orders

## 2020-08-30 NOTE — Assessment & Plan Note (Signed)
Resolved on lower dose of Norvasc

## 2020-08-30 NOTE — Assessment & Plan Note (Signed)
Cont pain meds per Dr Nelva Bush

## 2020-08-30 NOTE — Assessment & Plan Note (Signed)
Stable. Cont w/Vit B12

## 2020-08-30 NOTE — Assessment & Plan Note (Signed)
Elevated BP BP ok at home Cont w/Lotensin HCT, Amlodipine, Inderal

## 2020-08-30 NOTE — Addendum Note (Signed)
Addended by: Alfredia Ferguson A on: 08/30/2020 03:10 PM   Modules accepted: Orders

## 2020-08-30 NOTE — Assessment & Plan Note (Signed)
Not on statins, declined statins - low fat diet

## 2020-09-02 LAB — COMPREHENSIVE METABOLIC PANEL
ALT: 19 U/L (ref 0–53)
AST: 27 U/L (ref 0–37)
Albumin: 4.4 g/dL (ref 3.5–5.2)
Alkaline Phosphatase: 79 U/L (ref 39–117)
BUN: 19 mg/dL (ref 6–23)
CO2: 22 mEq/L (ref 19–32)
Calcium: 9.5 mg/dL (ref 8.4–10.5)
Chloride: 99 mEq/L (ref 96–112)
Creatinine, Ser: 1.09 mg/dL (ref 0.40–1.50)
GFR: 64.44 mL/min (ref >60.00)
Glucose, Bld: 77 mg/dL (ref 70–99)
Potassium: 4.8 mEq/L (ref 3.5–5.1)
Sodium: 135 mEq/L (ref 135–145)
Total Bilirubin: 0.9 mg/dL (ref 0.2–1.2)
Total Protein: 7.3 g/dL (ref 6.0–8.3)

## 2020-09-20 DIAGNOSIS — M5416 Radiculopathy, lumbar region: Secondary | ICD-10-CM | POA: Diagnosis not present

## 2020-09-20 DIAGNOSIS — Z96642 Presence of left artificial hip joint: Secondary | ICD-10-CM | POA: Diagnosis not present

## 2020-09-20 DIAGNOSIS — M48062 Spinal stenosis, lumbar region with neurogenic claudication: Secondary | ICD-10-CM | POA: Diagnosis not present

## 2020-09-20 DIAGNOSIS — M4180 Other forms of scoliosis, site unspecified: Secondary | ICD-10-CM | POA: Diagnosis not present

## 2020-10-02 DIAGNOSIS — M961 Postlaminectomy syndrome, not elsewhere classified: Secondary | ICD-10-CM | POA: Diagnosis not present

## 2020-10-07 ENCOUNTER — Other Ambulatory Visit: Payer: Self-pay | Admitting: Internal Medicine

## 2020-10-07 NOTE — Telephone Encounter (Signed)
Check [place] registry last filled 08/10/2020.Marland KitchenJohny Bishop

## 2020-10-08 ENCOUNTER — Encounter: Payer: Self-pay | Admitting: Pulmonary Disease

## 2020-10-08 ENCOUNTER — Ambulatory Visit: Payer: PPO | Admitting: Pulmonary Disease

## 2020-10-08 ENCOUNTER — Other Ambulatory Visit: Payer: Self-pay

## 2020-10-08 VITALS — BP 152/78 | HR 53 | Ht 72.0 in | Wt 218.0 lb

## 2020-10-08 DIAGNOSIS — G4733 Obstructive sleep apnea (adult) (pediatric): Secondary | ICD-10-CM | POA: Diagnosis not present

## 2020-10-08 DIAGNOSIS — Z9989 Dependence on other enabling machines and devices: Secondary | ICD-10-CM

## 2020-10-08 NOTE — Progress Notes (Signed)
Subjective:    Patient ID: Vernon Bishop, male    DOB: 03-Sep-1940, 80 y.o.   MRN: [id number]  HPI  Vernon Bishop is a 80 year old man who presents to reestablish care for OSA. He was last seen by me in 2016 and by APP in 2017 Few centrals on download attributed to chronic narcotics  PMH -atrial fibrillation, chronic pain on narcotics  His weight has dropped from 228 to 218 pounds.  He is very compliant with his CPAP by report and uses it 6 to 7 hours every night. His sleep is interrupted due to chronic pain and reports numerous nocturnal awakenings. Epworth sleepiness score is 5. Bedtime is between 11 PM and 1 AM, he sleeps on his side with 1 pillow, sleep latency can be up to an hour, reports numerous awakenings until he is out of bed at 7:30 AM feeling tired but denies dryness of mouth or headaches  There is no history suggestive of cataplexy, sleep paralysis or parasomnias  He would like to obtain a new CPAP machine.  His current machine was obtained in 2013 and he is getting an error message on his machine   Significant tests/ events reviewed  NPSG 2002:  AHI 80/hr.  CPAP 12cm optimal Auto 2013:  Optimal pressure 12cm.   Past Medical History:  Diagnosis Date   Arthralgia    Atrial fibrillation ([place])    1992   Benign prostatic hyperplasia    DDD (degenerative disc disease)    Depression    Diabetes mellitus    type II  - PT STATES NOT ON ANY DIABETIC MEDICINES   Dysrhythmia    aFIB   Dyssomnia    ED (erectile dysfunction)    Hyperlipidemia    Hypertension    Hypogonadism male    Joint pain    LBP (low back pain)    Multiple actinic keratoses    Muscle weakness    Upper limb   MVP (mitral valve prolapse)    Neck pain    OSA on CPAP    cpap   Osteoarthritis of hip    Osteoarthritis, hand    SEVERE LOWER BACK PAIN, AND RESTLESS LEG SYNDROME   Otalgia of right ear    Pain in left knee    Pain in thoracic spine    Palpitations    PTSD (post-traumatic stress  disorder)    Seizure ([facility])    Shoulder pain    Subjective visual disturbance    Swallowing problem    HX OF PILLS GETTING "STUCK" IN THROAT AT TIMES   Swelling    Type 2 diabetes mellitus ([place])    Vitamin D deficiency    Past Surgical History:  Procedure Laterality Date   APPENDECTOMY     BACK SURGERY  1987   CATARACT EXTRACTION     CERVICAL LAMINECTOMY  2004   [name], rod was placed- HAS ROM LIMITATIONS   CHOLECYSTECTOMY     COLONOSCOPY     KNEE SURGERY     left knee/arthroscopic   TOTAL HIP ARTHROPLASTY Right 03/14/2013   Procedure: RIGHT TOTAL HIP ARTHROPLASTY ANTERIOR APPROACH;  Surgeon: [name], MD;  Location: [facility];  Service: Orthopedics;  Laterality: Right;   TOTAL HIP ARTHROPLASTY Left 05/23/2019   Procedure: TOTAL HIP ARTHROPLASTY ANTERIOR APPROACH;  Surgeon: Paralee Cancel, MD;  Location: [facility];  Service: Orthopedics;  Laterality: Left;  70 mins    Allergies  Allergen Reactions   Abilify [Aripiprazole]     SOB  feeling   Cymbalta [Duloxetine Hcl]     constipation   Digoxin And Related     HR 41   Duloxetine     REACTION: constipation   Prednisolone     Edema, wt gain   Temazepam     .soc  Family History  Problem Relation Age of Onset   Allergies Mother    Stroke Mother        Clotting disorders   Heart disease Mother    Heart disease Father    Heart attack Father 62   Heart disease Brother    Rheum arthritis Paternal Grandmother    Other Paternal Grandmother        Rheumatism   Coronary artery disease Other    Diabetes Other    Hypertension Other    Esophageal cancer Maternal Aunt    Colon cancer Neg Hx    Rectal cancer Neg Hx    Stomach cancer Neg Hx       Review of Systems   Constitutional: negative for anorexia, fevers and sweats  Eyes: negative for irritation, redness and visual disturbance  Ears, nose, mouth, throat, and face: negative for earaches, epistaxis, nasal congestion and sore throat  Respiratory: negative for  cough, dyspnea on exertion, sputum and wheezing  Cardiovascular: negative for chest pain, dyspnea, lower extremity edema, orthopnea, palpitations and syncope  Gastrointestinal: negative for abdominal pain, constipation, diarrhea, melena, nausea and vomiting  Genitourinary:negative for dysuria, frequency and hematuria  Hematologic/lymphatic: negative for bleeding, easy bruising and lymphadenopathy  Musculoskeletal:negative for arthralgias, muscle weakness and stiff joints  Neurological: negative for coordination problems, gait problems, headaches and weakness  Endocrine: negative for diabetic symptoms including polydipsia, polyuria and weight loss     Objective:   Physical Exam  Gen. Pleasant, elderly, well-nourished, in no distress, normal affect ENT - no pallor,icterus, no post nasal drip, large tongue, class II airway Neck: No JVD, no thyromegaly, no carotid bruits Lungs: no use of accessory muscles, no dullness to percussion, clear without rales or rhonchi  Cardiovascular: Rhythm regular, heart sounds  normal, no murmurs or gallops, no peripheral edema Abdomen: soft and non-tender, no hepatosplenomegaly, BS normal. Musculoskeletal: No deformities, no cyanosis or clubbing, ambulates with cane Neuro:  alert, non focal       Assessment & Plan:

## 2020-10-08 NOTE — Patient Instructions (Signed)
Rx for CPAP 12 cm will be sent to [facility]

## 2020-10-08 NOTE — Assessment & Plan Note (Signed)
CPAP is certainly helped improve his daytime somnolence and fatigue.  He is very compliant We will send in a prescription for replacement CPAP of 12 cm. CPAP supplies will also be renewed for a year We will follow-up with a download once he gets his new machine, few centrals have been noted on his previous download for [place] attributed to chronic narcotics, we will ensure that he does not have central apneas.  If this are present then he will need a formal titration study  Weight loss encouraged, compliance with goal of at least 4-6 hrs every night is the expectation. Advised against medications with sedative side effects Cautioned against driving when sleepy - understanding that sleepiness will vary on a day to day basis

## 2020-10-18 DIAGNOSIS — M5416 Radiculopathy, lumbar region: Secondary | ICD-10-CM | POA: Diagnosis not present

## 2020-10-18 DIAGNOSIS — M545 Low back pain, unspecified: Secondary | ICD-10-CM | POA: Diagnosis not present

## 2020-10-19 ENCOUNTER — Other Ambulatory Visit: Payer: Self-pay | Admitting: Orthopedic Surgery

## 2020-10-19 DIAGNOSIS — M545 Low back pain, unspecified: Secondary | ICD-10-CM

## 2020-10-23 ENCOUNTER — Other Ambulatory Visit: Payer: Self-pay

## 2020-10-23 ENCOUNTER — Ambulatory Visit
Admission: RE | Admit: 2020-10-23 | Discharge: 2020-10-23 | Disposition: A | Discharge status: Home / Self Care | Payer: PPO | Source: Ambulatory Visit | Attending: Orthopedic Surgery | Admitting: Orthopedic Surgery

## 2020-10-23 DIAGNOSIS — M545 Low back pain, unspecified: Secondary | ICD-10-CM

## 2020-10-23 DIAGNOSIS — M48061 Spinal stenosis, lumbar region without neurogenic claudication: Secondary | ICD-10-CM | POA: Diagnosis not present

## 2020-10-28 DIAGNOSIS — M5416 Radiculopathy, lumbar region: Secondary | ICD-10-CM | POA: Diagnosis not present

## 2020-10-31 ENCOUNTER — Other Ambulatory Visit: Payer: Self-pay | Admitting: Internal Medicine

## 2020-10-31 DIAGNOSIS — M5416 Radiculopathy, lumbar region: Secondary | ICD-10-CM | POA: Diagnosis not present

## 2020-11-01 ENCOUNTER — Telehealth: Payer: Self-pay | Admitting: Internal Medicine

## 2020-11-01 NOTE — Telephone Encounter (Signed)
Seth Bake called in about patient medical clearance forms..  Says forms are incomplete & missing info  Would like a callback [phone]

## 2020-11-05 ENCOUNTER — Telehealth: Payer: Self-pay | Admitting: Internal Medicine

## 2020-11-05 NOTE — Chronic Care Management (AMB) (Signed)
  Chronic Care Management   Note  11/05/2020 Name: Vernon Bishop MRN: [id number] DOB: 1941-01-02  Vernon Bishop is a 80 y.o. year old male who is a primary care patient of [name], [name], MD. I reached out to [name] by phone today in response to a referral sent by Vernon Bishop PCP, [name], [name], MD.   Vernon Bishop was given information about Chronic Care Management services today including:  CCM service includes personalized support from designated clinical staff supervised by his physician, including individualized plan of care and coordination with other care providers 24/7 contact phone numbers for assistance for urgent and routine care needs. Service will only be billed when office clinical staff spend 20 minutes or more in a month to coordinate care. Only one practitioner may furnish and bill the service in a calendar month. The patient may stop CCM services at any time (effective at the end of the month) by phone call to the office staff.   Patient agreed to services and verbal consent obtained.   Follow up plan:   Lauretta Grill [employer] Scheduler

## 2020-11-12 DIAGNOSIS — G4733 Obstructive sleep apnea (adult) (pediatric): Secondary | ICD-10-CM | POA: Diagnosis not present

## 2020-11-14 ENCOUNTER — Ambulatory Visit (INDEPENDENT_AMBULATORY_CARE_PROVIDER_SITE_OTHER): Payer: PPO

## 2020-11-14 ENCOUNTER — Other Ambulatory Visit: Payer: Self-pay

## 2020-11-14 VITALS — BP 128/70 | HR 50 | Temp 97.5°F | Ht 72.0 in | Wt 198.6 lb

## 2020-11-14 DIAGNOSIS — Z0000 Encounter for general adult medical examination without abnormal findings: Secondary | ICD-10-CM

## 2020-11-14 NOTE — Patient Instructions (Addendum)
[name] , Thank you for taking time to come for your Medicare Wellness Visit. I appreciate your ongoing commitment to your health goals. Please review the following plan we discussed and let me know if I can assist you in the future.   Screening recommendations/referrals: Colonoscopy: last done 12/17/2011; no repeat due to age Recommended yearly ophthalmology/optometry visit for glaucoma screening and checkup Recommended yearly dental visit for hygiene and checkup  Vaccinations: Influenza vaccine: 12/20/2019; due Fall 2022 Pneumococcal vaccine: 08/27/2009, 08/30/2020 Tdap vaccine: 03/30/2008; overdue; not covered by Medicare as preventative but will cover as treatment for an injury. Shingles vaccine: Please call your insurance company to determine your out of pocket expense for the Shingrix vaccine. You may receive this vaccine at your local pharmacy.   Covid-19: 04/19/2019, 05/10/2019, 12/24/2019, 07/05/2020  Advanced directives: Please bring a copy of your health care power of attorney and living will to the office at your convenience.  Conditions/risks identified: Yes; Client understands the importance of follow-up with providers by attending scheduled visits and discussed goals to eat healthier, increase physical activity, exercise the brain, socialize more, get enough sleep and make time for laughter.  Next appointment: Please schedule your next Medicare Wellness Visit with your Nurse Health Advisor in 1 year by calling [phone].  Preventive Care 41 Years and Older, Male Preventive care refers to lifestyle choices and visits with your health care provider that can promote health and wellness. What does preventive care include? A yearly physical exam. This is also called an annual well check. Dental exams once or twice a year. Routine eye exams. Ask your health care provider how often you should have your eyes checked. Personal lifestyle choices, including: Daily care of your teeth and  gums. Regular physical activity. Eating a healthy diet. Avoiding tobacco and drug use. Limiting alcohol use. Practicing safe sex. Taking low doses of aspirin every day. Taking vitamin and mineral supplements as recommended by your health care provider. What happens during an annual well check? The services and screenings done by your health care provider during your annual well check will depend on your age, overall health, lifestyle risk factors, and family history of disease. Counseling  Your health care provider may ask you questions about your: Alcohol use. Tobacco use. Drug use. Emotional well-being. Home and relationship well-being. Sexual activity. Eating habits. History of falls. Memory and ability to understand (cognition). Work and work Statistician. Screening  You may have the following tests or measurements: Height, weight, and BMI. Blood pressure. Lipid and cholesterol levels. These may be checked every 5 years, or more frequently if you are over 79 years old. Skin check. Lung cancer screening. You may have this screening every year starting at age 47 if you have a 30-pack-year history of smoking and currently smoke or have quit within the past 15 years. Fecal occult blood test (FOBT) of the stool. You may have this test every year starting at age 7. Flexible sigmoidoscopy or colonoscopy. You may have a sigmoidoscopy every 5 years or a colonoscopy every 10 years starting at age 24. Prostate cancer screening. Recommendations will vary depending on your family history and other risks. Hepatitis C blood test. Hepatitis B blood test. Sexually transmitted disease (STD) testing. Diabetes screening. This is done by checking your blood sugar (glucose) after you have not eaten for a while (fasting). You may have this done every 1-3 years. Abdominal aortic aneurysm (AAA) screening. You may need this if you are a current or former smoker. Osteoporosis. You  may be screened  starting at age 3 if you are at high risk. Talk with your health care provider about your test results, treatment options, and if necessary, the need for more tests. Vaccines  Your health care provider may recommend certain vaccines, such as: Influenza vaccine. This is recommended every year. Tetanus, diphtheria, and acellular pertussis (Tdap, Td) vaccine. You may need a Td booster every 10 years. Zoster vaccine. You may need this after age 72. Pneumococcal 13-valent conjugate (PCV13) vaccine. One dose is recommended after age 13. Pneumococcal polysaccharide (PPSV23) vaccine. One dose is recommended after age 110. Talk to your health care provider about which screenings and vaccines you need and how often you need them. This information is not intended to replace advice given to you by your health care provider. Make sure you discuss any questions you have with your health care provider. Document Released: 04/12/2015 Document Revised: 12/04/2015 Document Reviewed: 01/15/2015 Elsevier Interactive Patient Education  2017 [employer] Prevention in the Home Falls can cause injuries. They can happen to people of all ages. There are many things you can do to make your home safe and to help prevent falls. What can I do on the outside of my home? Regularly fix the edges of walkways and driveways and fix any cracks. Remove anything that might make you trip as you walk through a door, such as a raised step or threshold. Trim any bushes or trees on the path to your home. Use bright outdoor lighting. Clear any walking paths of anything that might make someone trip, such as rocks or tools. Regularly check to see if handrails are loose or broken. Make sure that both sides of any steps have handrails. Any raised decks and porches should have guardrails on the edges. Have any leaves, snow, or ice cleared regularly. Use sand or salt on walking paths during winter. Clean up any spills in your garage  right away. This includes oil or grease spills. What can I do in the bathroom? Use night lights. Install grab bars by the toilet and in the tub and shower. Do not use towel bars as grab bars. Use non-skid mats or decals in the tub or shower. If you need to sit down in the shower, use a plastic, non-slip stool. Keep the floor dry. Clean up any water that spills on the floor as soon as it happens. Remove soap buildup in the tub or shower regularly. Attach bath mats securely with double-sided non-slip rug tape. Do not have throw rugs and other things on the floor that can make you trip. What can I do in the bedroom? Use night lights. Make sure that you have a light by your bed that is easy to reach. Do not use any sheets or blankets that are too big for your bed. They should not hang down onto the floor. Have a firm chair that has side arms. You can use this for support while you get dressed. Do not have throw rugs and other things on the floor that can make you trip. What can I do in the kitchen? Clean up any spills right away. Avoid walking on wet floors. Keep items that you use a lot in easy-to-reach places. If you need to reach something above you, use a strong step stool that has a grab bar. Keep electrical cords out of the way. Do not use floor polish or wax that makes floors slippery. If you must use wax, use non-skid floor wax. Do  not have throw rugs and other things on the floor that can make you trip. What can I do with my stairs? Do not leave any items on the stairs. Make sure that there are handrails on both sides of the stairs and use them. Fix handrails that are broken or loose. Make sure that handrails are as long as the stairways. Check any carpeting to make sure that it is firmly attached to the stairs. Fix any carpet that is loose or worn. Avoid having throw rugs at the top or bottom of the stairs. If you do have throw rugs, attach them to the floor with carpet tape. Make  sure that you have a light switch at the top of the stairs and the bottom of the stairs. If you do not have them, ask someone to add them for you. What else can I do to help prevent falls? Wear shoes that: Do not have high heels. Have rubber bottoms. Are comfortable and fit you well. Are closed at the toe. Do not wear sandals. If you use a stepladder: Make sure that it is fully opened. Do not climb a closed stepladder. Make sure that both sides of the stepladder are locked into place. Ask someone to hold it for you, if possible. Clearly mark and make sure that you can see: Any grab bars or handrails. First and last steps. Where the edge of each step is. Use tools that help you move around (mobility aids) if they are needed. These include: Canes. Walkers. Scooters. Crutches. Turn on the lights when you go into a dark area. Replace any light bulbs as soon as they burn out. Set up your furniture so you have a clear path. Avoid moving your furniture around. If any of your floors are uneven, fix them. If there are any pets around you, be aware of where they are. Review your medicines with your doctor. Some medicines can make you feel dizzy. This can increase your chance of falling. Ask your doctor what other things that you can do to help prevent falls. This information is not intended to replace advice given to you by your health care provider. Make sure you discuss any questions you have with your health care provider. Document Released: 01/10/2009 Document Revised: 08/22/2015 Document Reviewed: 04/20/2014 Elsevier Interactive Patient Education  2017 [employer].

## 2020-11-14 NOTE — Progress Notes (Addendum)
Subjective:   Vernon Bishop is a 80 y.o. male who presents for Medicare Annual/Subsequent preventive examination.  Review of Systems     Cardiac Risk Factors include: advanced age (>57mn, >>42women);dyslipidemia;hypertension;family history of premature cardiovascular disease;male gender     Objective:    Today's Vitals   11/14/20 1450  BP: 128/70  Pulse: (!) 50  Temp: (!) 97.5 F (36.4 C)  TempSrc: Temporal  SpO2: 91%  Weight: 198 lb 9.6 oz (90.1 kg)  Height: 6' (1.829 m)  PainSc: 8    Body mass index is 26.94 kg/m.  Advanced Directives 11/14/2020 05/23/2019 05/10/2019 09/20/2018 04/02/2017 01/02/2015 03/14/2013  Does Patient Have a Medical Advance Directive? Yes Yes Yes Yes Yes Yes Patient has advance directive, copy not in chart  Type of Advance Directive [place] will [place] will [place] will [place] will [place]Living will - [place] will  Does patient want to make changes to medical advance directive? No - Patient declined No - Patient declined - - - - No  Copy of Healthcare Power of Attorney in Chart? No - copy requested No - copy requested - No - copy requested No - copy requested - Copy requested from family  Would patient like information on creating a medical advance directive? - - - - - Yes - Educational materials given -  Pre-existing out of facility DNR order (yellow form or pink MOST form) - - - - - - No  Some encounter information is confidential and restricted. Go to Review Flowsheets activity to see all data.    Current Medications (verified) Outpatient Encounter Medications as of 11/14/2020  Medication Sig   amLODipine (NORVASC) 5 MG tablet Take 1 tablet (5 mg total) by mouth daily.   Cholecalciferol (VITAMIN D3) 1000 UNIT tablet Take 1,000 Units by mouth daily.     ciclopirox (PENLAC) 8 % solution  APPLY OVER NAIL AND SURROUNDING SKIN. APPLY DAILY OVER PREVIOUS COAT. AFTER SEVEN (7) DAYS, MAY REMOVE WITH ALCOHOL AND AND CONTINUE CYCLE.   clotrimazole-betamethasone (LOTRISONE) cream APPLY TO AFFECTED AREA 2 TIMES A DAY.   Cyanocobalamin (VITAMIN B-12 CR) 1000 MCG TBCR Take 1,000 mcg by mouth daily.     docusate sodium (COLACE) 100 MG capsule Take 1 capsule (100 mg total) by mouth 2 (two) times daily.   dutasteride (AVODART) 0.5 MG capsule Take 1 capsule (0.5 mg total) by mouth daily.   ferrous sulfate (FERROUSUL) 325 (65 FE) MG tablet Take 1 tablet (325 mg total) by mouth 3 (three) times daily with meals for 14 days.   FLUoxetine (PROZAC) 40 MG capsule TAKE 1 CAPSULE BY MOUTH DAILY.   gabapentin (NEURONTIN) 300 MG capsule TAKE 1 CAPSULE BY MOUTH THREE TIMES A DAY   ketoconazole (NIZORAL) 2 % shampoo APPLY AS DIRECTED 2 TIMES A WEEK   morphine (MS CONTIN) 30 MG 12 hr tablet Take 1 tablet (30 mg total) by mouth every 12 (twelve) hours.   Polyethyl Glycol-Propyl Glycol (LUBRICANT EYE DROPS) 0.4-0.3 % SOLN Place 1 drop into both eyes 3 (three) times daily as needed (dry/irritated eyes.).   polyethylene glycol (MIRALAX / GLYCOLAX) 17 g packet Take 17 g by mouth 2 (two) times daily.   propranolol (INDERAL) 80 MG tablet TAKE 1 TABLET BY MOUTH 3 TIMES DAILY.   testosterone cypionate (DEPOTESTOSTERONE CYPIONATE) 200 MG/ML injection INJECT 3 MILLILITERS INTO THE MUSCLE EVERY 14 DAYS   tiZANidine (ZANAFLEX)  4 MG tablet TAKE 1 TABLET BY MOUTH EVERY 8 HOURS AS NEEDED FOR MUSCLE SPASMS.   traZODone (DESYREL) 50 MG tablet TAKE 1 TO 2 TABLETS BY MOUTH AT BEDTIME.   No facility-administered encounter medications on file as of 11/14/2020.    Allergies (verified) Abilify [aripiprazole], Cymbalta [duloxetine hcl], Digoxin and related, Duloxetine, Prednisolone, and Temazepam   History: Past Medical History:  Diagnosis Date   Arthralgia    Atrial fibrillation ([place])    1992   Benign prostatic hyperplasia     DDD (degenerative disc disease)    Depression    Diabetes mellitus    type II  - PT STATES NOT ON ANY DIABETIC MEDICINES   Dysrhythmia    aFIB   Dyssomnia    ED (erectile dysfunction)    Hyperlipidemia    Hypertension    Hypogonadism male    Joint pain    LBP (low back pain)    Multiple actinic keratoses    Muscle weakness    Upper limb   MVP (mitral valve prolapse)    Neck pain    OSA on CPAP    cpap   Osteoarthritis of hip    Osteoarthritis, hand    SEVERE LOWER BACK PAIN, AND RESTLESS LEG SYNDROME   Otalgia of right ear    Pain in left knee    Pain in thoracic spine    Palpitations    PTSD (post-traumatic stress disorder)    Seizure (HCC)    Shoulder pain    Subjective visual disturbance    Swallowing problem    HX OF PILLS GETTING "STUCK" IN THROAT AT TIMES   Swelling    Type 2 diabetes mellitus ([place])    Vitamin D deficiency    Past Surgical History:  Procedure Laterality Date   APPENDECTOMY     BACK SURGERY  1987   CATARACT EXTRACTION     CERVICAL LAMINECTOMY  2004   [name], rod was placed- HAS ROM LIMITATIONS   CHOLECYSTECTOMY     COLONOSCOPY     KNEE SURGERY     left knee/arthroscopic   TOTAL HIP ARTHROPLASTY Right 03/14/2013   Procedure: RIGHT TOTAL HIP ARTHROPLASTY ANTERIOR APPROACH;  Surgeon: [name], MD;  Location: [facility];  Service: Orthopedics;  Laterality: Right;   TOTAL HIP ARTHROPLASTY Left 05/23/2019   Procedure: TOTAL HIP ARTHROPLASTY ANTERIOR APPROACH;  Surgeon: Paralee Cancel, MD;  Location: [facility];  Service: Orthopedics;  Laterality: Left;  70 mins   Family History  Problem Relation Age of Onset   Allergies Mother    Stroke Mother        Clotting disorders   Heart disease Mother    Heart disease Father    Heart attack Father 51   Heart disease Brother    Rheum arthritis Paternal Grandmother    Other Paternal Grandmother        Rheumatism   Coronary artery disease Other    Diabetes Other    Hypertension Other    Esophageal  cancer Maternal Aunt    Colon cancer Neg Hx    Rectal cancer Neg Hx    Stomach cancer Neg Hx    Social History   Socioeconomic History   Marital status: Married    Spouse name: Not on file   Number of children: 4   Years of education: Not on file   Highest education level: Not on file  Occupational History   Occupation: Retired-    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  Tobacco Use   Smoking status: Former    Packs/day: 0.50    Years: 12.00    Pack years: 6.00    Types: Cigarettes    Quit date: 03/31/1971    Years since quitting: 49.6   Smokeless tobacco: Never   Tobacco comments:    Started at age 4  Vaping Use   Vaping Use: Never used  Substance and Sexual Activity   Alcohol use: Yes    Alcohol/week: 2.0 standard drinks    Types: 2 Cans of beer per week    Comment: occasionally   Drug use: No   Sexual activity: Yes  Other Topics Concern   Not on file  Social History Narrative   FAMILY HISTORY   History of CAD Male 1st degree relative <50   History Diabetes 1st degree relative   History hypertension               Social Determinants of Health   Financial Resource Strain: Low Risk    Difficulty of Paying Living Expenses: Not hard at all  Food Insecurity: No Food Insecurity   Worried About Charity fundraiser in the Last Year: Never true   Ran Out of Food in the Last Year: Never true  Transportation Needs: No Transportation Needs   Lack of Transportation (Medical): No   Lack of Transportation (Non-Medical): No  Physical Activity: Inactive   Days of Exercise per Week: 0 days   Minutes of Exercise per Session: 0 min  Stress: No Stress Concern Present   Feeling of Stress : Not at all  Social Connections: Socially Integrated   Frequency of Communication with Friends and Family: More than three times a week   Frequency of Social Gatherings with Friends and Family: More than three times a week   Attends Religious Services: 1 to 4 times per year    Active Member of [employer] or Organizations: Yes   Attends Archivist Meetings: 1 to 4 times per year   Marital Status: Married    Tobacco Counseling Counseling given: Not Answered Tobacco comments: Started at age 40   Clinical Intake:  Pre-visit preparation completed: Yes  Pain : 0-10 Pain Score: 8  Pain Type: Chronic pain Pain Location: Back Pain Orientation: Lower Pain Radiating Towards: left lower ext. Pain Descriptors / Indicators: Constant, Discomfort, Dull, Crushing, Restless Pain Onset: More than a month ago Pain Frequency: Constant Pain Relieving Factors: Morphine '30mg'$  TID Effect of Pain on Daily Activities: Pain can diminish job performance, lower motivation to exercise, and prevent you from completing daily tasks. Pain produces disability and affects the quality of life.  Pain Relieving Factors: Morphine '30mg'$  TID  BMI - recorded: 26.94 Nutritional Status: BMI 25 -29 Overweight Nutritional Risks: None Diabetes: No CBG done?: No Did pt. 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 no  Interpreter Needed?: No  Information entered by :: Lisette Abu, LPN   Activities of Daily Living In your present state of health, do you have any difficulty performing the following activities: 11/14/2020  Hearing? Y  Vision? N  Difficulty concentrating or making decisions? N  Walking or climbing stairs? Y  Dressing or bathing? N  Doing errands, shopping? N  Preparing  Food and eating ? N  Using the Toilet? N  In the past six months, have you accidently leaked urine? N  Do you have problems with loss of bowel control? N  Managing your Medications? N  Managing your Finances? N  Housekeeping or managing your  Housekeeping? N  Some recent data might be hidden    Patient Care Team: [name], [name], MD as PCP - General (Internal Medicine) Paralee Cancel, MD as Consulting Physician (Orthopedic Surgery) Norma Fredrickson, MD as Consulting Physician (Psychiatry) Melina Schools, MD as Consulting Physician (Orthopedic Surgery) Szabat, Darnelle Maffucci, [facility] as Pharmacist (Pharmacist) [facility] [place] as Consulting Physician (Ophthalmology)  Indicate any recent Medical Services you may have received from other than [facility] providers in the past year (date may be approximate).     Assessment:   This is a routine wellness examination for [facility].  Hearing/Vision screen Hearing Screening - Comments:: Patient stated that he has issues with hearing.  No hearing aids. Vision Screening - Comments:: Eye exam done annually by Dr. Maudie Mercury at [facility] [place].  Dietary issues and exercise activities discussed: Current Exercise Habits: The patient does not participate in regular exercise at present, Exercise limited by: neurologic condition(s);orthopedic condition(s)   Goals Addressed   None   Depression Screen PHQ 2/9 Scores 11/14/2020 09/20/2018 04/02/2017 11/02/2016 02/03/2016 10/15/2014  PHQ - 2 Score 0 '2 5 3 4 1  '$ PHQ- 9 Score - '7 16 6 '$ - -    Fall Risk Fall Risk  11/14/2020 08/30/2020 05/05/2019 09/20/2018 09/20/2018  Falls in the past year? '1 1 1 1 1  '$ Number falls in past yr: 0 0 1 0 0  Injury with Fall? 0 0 0 0 0  Risk for fall due to : No Fall Risks;Impaired balance/gait No Fall Risks - Impaired balance/gait;Impaired mobility -  Follow up - Falls evaluation completed - Falls prevention discussed Falls evaluation completed    FALL RISK PREVENTION PERTAINING TO THE HOME:  Any stairs in or around the home? Yes  If so, are there any without handrails? No  Home free of loose throw rugs in walkways, pet beds, electrical cords, etc? Yes  Adequate lighting in your home to reduce  risk of falls? Yes   ASSISTIVE DEVICES UTILIZED TO PREVENT FALLS:  Life alert? No  Use of a cane, walker or w/c? Yes  Grab bars in the bathroom? Yes  Shower chair or bench in shower? Yes  Elevated toilet seat or a handicapped toilet? No   TIMED UP AND GO:  Was the test performed? Yes .  Length of time to ambulate 10 feet: 11 sec.   Gait slow and steady with assistive device  Cognitive Function: Normal cognitive status assessed by direct observation by this Nurse Health Advisor. No abnormalities found.   MMSE - Mini Mental State Exam 04/02/2017  Not completed: Unable to complete  Orientation to time 5  Orientation to Place 5        Immunizations Immunization History  Administered Date(s) Administered   Fluad Quad(high Dose 65+) 11/22/2018, 12/20/2019   Influenza Split 01/28/2011, 12/18/2011   Influenza Whole 02/14/2007, 12/11/2009   Influenza, High Dose Seasonal PF 02/03/2016, 12/30/2016, 01/04/2018   Influenza,inj,Quad PF,6+ Mos 01/03/2013, 01/10/2014, 12/05/2014   PFIZER(Purple Top)SARS-COV-2 Vaccination 04/19/2019, 05/10/2019, 12/24/2019, 07/05/2020   PNEUMOCOCCAL CONJUGATE-20 08/30/2020   Pneumococcal Conjugate-13 05/08/2013   Pneumococcal Polysaccharide-23 08/27/2009   Td 03/30/2008   Zoster, Live 04/13/2014    TDAP status: Due,  Education has been provided regarding the importance of this vaccine. Advised may receive this vaccine at local pharmacy or Health Dept. Aware to provide a copy of the vaccination record if obtained from local pharmacy or Health Dept. Verbalized acceptance and understanding.  Flu Vaccine status: Up to date  Pneumococcal vaccine status: Up to date  Covid-19 vaccine status: Completed vaccines  Qualifies for Shingles Vaccine? Yes   Zostavax completed Yes   Shingrix Completed?: No.    Education has been provided regarding the importance of this vaccine. Patient has been advised to call insurance company to determine out of pocket expense if  they have not yet received this vaccine. Advised may also receive vaccine at local pharmacy or Health Dept. Verbalized acceptance and understanding.  Screening Tests Health Maintenance  Topic Date Due   OPHTHALMOLOGY EXAM  12/28/2017   HEMOGLOBIN A1C  06/18/2020   COVID-19 Vaccine (5 - Booster for Pfizer series) 11/04/2020   INFLUENZA VACCINE  10/28/2020   URINE MICROALBUMIN  12/19/2020   Zoster Vaccines- Shingrix (1 of 2) 11/30/2020 (Originally 11/29/1959)   FOOT EXAM  08/30/2021 (Originally 09/20/2019)   TETANUS/TDAP  08/30/2021 (Originally 03/30/2018)   Hepatitis C Screening  08/30/2021 (Originally 11/29/1958)   PNA vac Low Risk Adult  Completed   HPV VACCINES  Aged Out    Health Maintenance  Health Maintenance Due  Topic Date Due   OPHTHALMOLOGY EXAM  12/28/2017   HEMOGLOBIN A1C  06/18/2020   COVID-19 Vaccine (5 - Booster for Pfizer series) 11/04/2020   INFLUENZA VACCINE  10/28/2020   URINE MICROALBUMIN  12/19/2020    Colorectal cancer screening: No longer required.   Lung Cancer Screening: (Low Dose CT Chest recommended if Age 42-80 years, 30 pack-year currently smoking OR have quit w/in 15years.) does not qualify.   Lung Cancer Screening Referral: no  Additional Screening:  Hepatitis C Screening: does qualify; Completed no  Vision Screening: Recommended annual ophthalmology exams for early detection of glaucoma and other disorders of the eye. Is the patient up to date with their annual eye exam?  Yes  Who is the provider or what is the name of the office in which the patient attends annual eye exams? [facility]  If pt is not established with a provider, would they like to be referred to a provider to establish care? No .   Dental Screening: Recommended annual dental exams for proper oral hygiene  Community Resource Referral / Chronic Care Management: CRR required this visit?  No   CCM required this visit?  No      Plan:     I have  personally reviewed and noted the following in the patient's chart:   Medical and social history Use of alcohol, tobacco or illicit drugs  Current medications and supplements including opioid prescriptions. Patient is currently taking opioid prescriptions. Information provided to patient regarding non-opioid alternatives. Patient advised to discuss non-opioid treatment plan with their provider. Functional ability and status Nutritional status Physical activity Advanced directives List of other physicians Hospitalizations, surgeries, and ER visits in previous 12 months Vitals Screenings to include cognitive, depression, and falls Referrals and appointments  In addition, I have reviewed and discussed with patient certain preventive protocols, quality metrics, and best practice recommendations. A written personalized care plan for preventive services as well as general preventive health recommendations were provided to patient.     Sheral Flow, LPN   [id number]   Nurse Notes: n/a  Medical screening examination/treatment/procedure(s) were  performed by non-physician practitioner and as supervising physician I was immediately available for consultation/collaboration.  I agree with above. Lew Dawes, MD

## 2020-11-15 ENCOUNTER — Ambulatory Visit: Payer: PPO

## 2020-12-05 ENCOUNTER — Telehealth: Payer: Self-pay

## 2020-12-05 NOTE — Progress Notes (Signed)
Chronic Care Management Pharmacy Assistant   Name: JACUB FIFITA  MRN: [id number] DOB: 12-01-1940  Vernon Bishop is an 80 y.o. year old male who presents for his initial CCM visit with the clinical pharmacist.   Recent office visits:  08/30/20 [name]. [name] MD - Seen for vaccination - No medication changes - Follow up in 6 months   Recent consult visits:  10/08/20 Rigoberto Noel - Pulmonary Care - Seen for OSA on CPAP - Ambulatory referral to DME - Rx for CPAP - No medication changes noted - Follow up in 6 months     Hospital visits:  None in previous 6 months  Medications: Outpatient Encounter Medications as of 12/05/2020  Medication Sig Note   amLODipine (NORVASC) 5 MG tablet Take 1 tablet (5 mg total) by mouth daily.    Cholecalciferol (VITAMIN D3) 1000 UNIT tablet Take 1,000 Units by mouth daily.   05/08/2019: On hold until patient purchases more   ciclopirox (PENLAC) 8 % solution APPLY OVER NAIL AND SURROUNDING SKIN. APPLY DAILY OVER PREVIOUS COAT. AFTER SEVEN (7) DAYS, MAY REMOVE WITH ALCOHOL AND AND CONTINUE CYCLE.    clotrimazole-betamethasone (LOTRISONE) cream APPLY TO AFFECTED AREA 2 TIMES A DAY.    Cyanocobalamin (VITAMIN B-12 CR) 1000 MCG TBCR Take 1,000 mcg by mouth daily.   05/08/2019: On hold until patient purchases more   docusate sodium (COLACE) 100 MG capsule Take 1 capsule (100 mg total) by mouth 2 (two) times daily.    dutasteride (AVODART) 0.5 MG capsule Take 1 capsule (0.5 mg total) by mouth daily.    ferrous sulfate (FERROUSUL) 325 (65 FE) MG tablet Take 1 tablet (325 mg total) by mouth 3 (three) times daily with meals for 14 days. 08/30/2020: Not taking   FLUoxetine (PROZAC) 40 MG capsule TAKE 1 CAPSULE BY MOUTH DAILY.    gabapentin (NEURONTIN) 300 MG capsule TAKE 1 CAPSULE BY MOUTH THREE TIMES A DAY    ketoconazole (NIZORAL) 2 % shampoo APPLY AS DIRECTED 2 TIMES A WEEK    morphine (MS CONTIN) 30 MG 12 hr tablet Take 1 tablet (30 mg total) by mouth every 12  (twelve) hours. 08/30/2020: Every 8 hours   Polyethyl Glycol-Propyl Glycol (LUBRICANT EYE DROPS) 0.4-0.3 % SOLN Place 1 drop into both eyes 3 (three) times daily as needed (dry/irritated eyes.).    polyethylene glycol (MIRALAX / GLYCOLAX) 17 g packet Take 17 g by mouth 2 (two) times daily.    propranolol (INDERAL) 80 MG tablet TAKE 1 TABLET BY MOUTH 3 TIMES DAILY.    testosterone cypionate (DEPOTESTOSTERONE CYPIONATE) 200 MG/ML injection INJECT 3 MILLILITERS INTO THE MUSCLE EVERY 14 DAYS    tiZANidine (ZANAFLEX) 4 MG tablet TAKE 1 TABLET BY MOUTH EVERY 8 HOURS AS NEEDED FOR MUSCLE SPASMS.    traZODone (DESYREL) 50 MG tablet TAKE 1 TO 2 TABLETS BY MOUTH AT BEDTIME.    No facility-administered encounter medications on file as of 12/05/2020.   amLODipine (NORVASC) 5 MG tablet - Last filled 08/30/20 90 DS clotrimazole-betamethasone (LOTRISONE) cream - Last filled 07/05/19 dutasteride (AVODART) 0.5 MG capsule - Last filled 12/20/19 30 DS FLUoxetine (PROZAC) 40 MG capsule - Last filled 07/26/20 90 DS gabapentin (NEURONTIN) 300 MG capsule - Last filled 11/01/20 30 DS morphine (MS CONTIN) 30 MG 12 hr tabletv - Last filled 09/19/19 30 DS propranolol (INDERAL) 80 MG tablet - Last filled 10/31/19 90 DS testosterone cypionate (DEPOTESTOSTERONE CYPIONATE) 200 MG/ML injection - Last filled 10/14/20 tiZANidine (ZANAFLEX) 4  MG tablet - Last filled 04/25/20 60 tablets  traZODone (DESYREL) 50 MG tablet - Last filled 06/12/20 60 tablets   Star Rating Drugs: None    Andee Poles, CMA

## 2020-12-09 ENCOUNTER — Ambulatory Visit (INDEPENDENT_AMBULATORY_CARE_PROVIDER_SITE_OTHER): Payer: PPO

## 2020-12-09 ENCOUNTER — Other Ambulatory Visit: Payer: Self-pay

## 2020-12-09 DIAGNOSIS — F339 Major depressive disorder, recurrent, unspecified: Secondary | ICD-10-CM

## 2020-12-09 DIAGNOSIS — E785 Hyperlipidemia, unspecified: Secondary | ICD-10-CM

## 2020-12-09 DIAGNOSIS — E291 Testicular hypofunction: Secondary | ICD-10-CM

## 2020-12-09 DIAGNOSIS — M5440 Lumbago with sciatica, unspecified side: Secondary | ICD-10-CM

## 2020-12-09 DIAGNOSIS — I10 Essential (primary) hypertension: Secondary | ICD-10-CM

## 2020-12-09 DIAGNOSIS — M21372 Foot drop, left foot: Secondary | ICD-10-CM | POA: Diagnosis not present

## 2020-12-09 NOTE — Patient Instructions (Signed)
Vernon Bishop,  It was great to talk to you today!  Please call me with any questions or concerns.  Visit Information   PATIENT GOALS:   Goals Addressed             This Visit's Progress    Manage Chronic Pain       Timeframe:  Long-Range Goal Priority:  High Start Date:  12/09/2020                          Expected End Date: 06/08/2021                     Follow Up Date 04/08/2021   - call for medicine refill 2 or 3 days before it runs out - develop a personal pain management plan - keep track of prescription refills - plan exercise or activity when pain is best controlled - prioritize tasks for the day - track times pain is worst and when it is best - track what makes the pain worse and what makes it better - use ice or heat for pain relief - work slower and less intense when having pain    Why is this important?   Day-to-day life can be hard when you have chronic pain.  Pain medicine is just one piece of the treatment puzzle.  You can try these action steps to help you manage your pain.       Track and Manage My Blood Pressure-Hypertension       Timeframe:  Long-Range Goal Priority:  High Start Date:  12/09/2020                       Expected End Date:  06/08/2021               Follow Up Date 04/08/2021   - check blood pressure 3 times per week - choose a place to take my blood pressure (home, clinic or office, retail store) - write blood pressure results in a log or diary    Why is this important?   You won't feel high blood pressure, but it can still hurt your blood vessels.  High blood pressure can cause heart or kidney problems. It can also cause a stroke.  Making lifestyle changes like losing a little weight or eating less salt will help.  Checking your blood pressure at home and at different times of the day can help to control blood pressure.  If the doctor prescribes medicine remember to take it the way the doctor ordered.  Call the office if you  cannot afford the medicine or if there are questions about it.          Consent to CCM Services: Vernon Bishop was given information about Chronic Care Management services including:  CCM service includes personalized support from designated clinical staff supervised by his physician, including individualized plan of care and coordination with other care providers 24/7 contact phone numbers for assistance for urgent and routine care needs. Service will only be billed when office clinical staff spend 20 minutes or more in a month to coordinate care. Only one practitioner may furnish and bill the service in a calendar month. The patient may stop CCM services at any time (effective at the end of the month) by phone call to the office staff. The patient will be responsible for cost sharing (co-pay) of up to 20% of the service fee (  after annual deductible is met).  Patient agreed to services and verbal consent obtained.   Patient verbalizes understanding of instructions provided today and agrees to view in [place].   Telephone follow up appointment with care management team member scheduled for: 4 months The patient has been provided with contact information for the care management team and has been advised to call with any health related questions or concerns.   Vernon Bishop, PharmD Clinical Pharmacist, [place]    CLINICAL CARE PLAN: Patient Care Plan: CCM Care Plan     Problem Identified: Hypertension, Hyperlipidemia, Depression, BPH, Chronic Back Pain, and Hypogonadism   Priority: High  Onset Date: 12/09/2020     Long-Range Goal: Disease Management   Start Date: 12/09/2020  Expected End Date: 06/08/2021  This Visit's Progress: On track  Priority: High  Note:   Current Barriers:  Unable to independently monitor therapeutic efficacy Unable to achieve control of cholesterol   Pharmacist Clinical Goal(s):  Patient will achieve adherence to monitoring guidelines and  medication adherence to achieve therapeutic efficacy achieve improvement in LDL as evidenced by next lipid panel through collaboration with PharmD and provider.   Interventions: 1:1 collaboration with [name], [name], MD regarding development and update of comprehensive plan of care as evidenced by provider attestation and co-signature Inter-disciplinary care team collaboration (see longitudinal plan of care) Comprehensive medication review performed; medication list updated in electronic medical record  Hypertension (BP goal <140/90) -Controlled -Current treatment: Amlodipine 5mg  - 1 tablet daily  Propranolol 80mg  - 1 tablet 3 times daily  -Medications previously tried: amlodipine 10mg  daily (caused edema) , benazepril, HCTZ, torsemide  -Current home readings: reports that blood pressures average 130/84 -Current dietary habits: reports to drinking 1 cup of coffee daily, does not use much salt  -Current exercise habits: minimal at this time due to back pain -Denies hypotensive/hypertensive symptoms -Educated on BP goals and benefits of medications for prevention of heart attack, stroke and kidney damage; Daily salt intake goal < 2300 mg; Exercise goal of 150 minutes per week; Importance of home blood pressure monitoring; Proper BP monitoring technique; -Counseled to monitor BP at home 1-2 times weekly, document, and provide log at future appointments -Counseled on diet and exercise extensively Recommended to continue current medication  Hyperlipidemia: (LDL goal < 100) -Not ideally controlled Lab Results  Component Value Date   LDLCALC 120 (H) 12/20/2019  -Current treatment: N/a - has refused statins in the past -Medications previously tried: aspirin  -Current dietary patterns: reports that he is trying to reduce amount of fried/ fatty foods he is eating - has lost 40lbs over the past year form dietary changes -Current exercise habits: limited at this time due to back pain   -Educated on Cholesterol goals;  Benefits of statin for ASCVD risk reduction; Importance of limiting foods high in cholesterol; -Counseled on diet and exercise extensively Recommended for patient to start rosuvastatin 5mg  daily   Depression/Insomnia (Goal: promotion of positive mood/ improved sleep quality) -Controlled -Current treatment: Fluoxetine 40mg  - 1 capsule daily  Trazodone 50mg  - 1-2 tablets at bedtime  -Medications previously tried/failed: bupropion, abilify, cymbalta, temazepam, clonazepam  -PHQ2 -9: 0 -Educated on Benefits of medication for symptom control Benefits of cognitive-behavioral therapy with or without medication -Recommended to continue current medication  Chronic Pain (Goal: Pain control) - Managed by Dr. Nelva Bush -Not ideally controlled -last MRI shows - severe canal stenosis, severe foraminal stenosis, in lumbar vertebrae  -Current treatment  Morphine Sulfate ER 30mg  -  1 tablet every 8 hours Gabapentin 367m - 1 capsule 3 times daily  Tizanidine 410m- 1 tablet every 8 hours as needed  Acetaminophen 50071m 2 tablets daily  -Medications previously tried: cyclobenzaprine, norco, ibuprofen  - will defer pain management to Dr. RamNelva Bushpatient reports that he is scheduled for surgery 10/5 with Dr. BroRolena Infantespinal fusion surgery (L4-L5-L6)  Hypogonadism (Goal: Maintenance of appropriate testosterone levels) -Controlled Lab Results  Component Value Date   TESTOSTERONE 166.24 (L) 10/19/2018  -Current treatment  Testosterone cypionate 200m69m - inject 3 mL every 12 days  -Medications previously tried: n/a  -Recommended to continue current medication'  BPH (Goal: Prevention of disease progression/ treatment of urinary symptoms) -Controlled -Current treatment  Dutasteride 0.5mg 32m capsule daily  -Medications previously tried: n/a  -Recommended to continue current medication  Health Maintenance -Vaccine gaps: Shingles, Influenza, COVID booster -Current  therapy:  Ciclopirox 8% solution - applied to nails daily x 7 days cycles Clotrimazole-Betamethasone Cream - applied twice daily as needed  Docusate 100mg 16mcapsule twice daily  Miralax - 17g twice daily  Lubricant eye drops - 1 drop into each eye 3 times daily as needed  Vitamin B12 1000mcg 18mtablet daily  Lab Results  Component Value Date   VITAMINB12 657 04/05/2019  Vitamin D3 1000units - 1 tablet daily  Lab Results  Component Value Date   VD25OH 35.78 04/05/2019  -Educated on Cost vs benefit of each product must be carefully weighed by individual consumer -Patient is satisfied with current therapy and denies issues -Recommended to continue current medication  Patient Goals/Self-Care Activities Patient will:  - take medications as prescribed check blood pressure 1-2 times weekly, document, and provide at future appointments -Continue dietary changes, reducing fried and fatty foods / increase fresh ingredients and lean proteins   Follow Up Plan: Telephone follow up appointment with care management team member scheduled for: The patient has been provided with contact information for the care management team and has been advised to call with any health related questions or concerns.

## 2020-12-09 NOTE — Progress Notes (Addendum)
 Chronic Care Management Pharmacy Note  12/09/2020 Name:  Vernon Bishop MRN:  [id number] DOB:  05/06/1940  Summary: - Patient notes that recently he feels depression/ mood has been controlled, notes that sleep quality has been poor due to his back pain - has surgery scheduled 01/01/2021 with Dr. Books, planned for fusion of L4-L5-L6. -Patient reports to 40lbs of weight loss over the last year from changes in his diet, continues to make improvements in his diet, hoping to continue his weight loss  -Checking blood pressures throughout the week, averaging 130/84 - afib noted in problem list, per note history afib occurred once in 1992 and there have been no additional episodes since - per patient aspirin stopped with last PCP visit - will reach out and confirm with PCP  Recommendations/Changes made from today's visit: - Recommending for patient to start rosuvastatin 5mg daily due hyperlipidemia/ CV risk reduction -Patient to continue monitoring blood pressure at least 1-2 times weekly, patient to reach out should BP average >140/90  Subjective: Vernon Bishop is an 80 y.o. year old male who is a primary patient of [name], Aleksei V, MD.  The CCM team was consulted for assistance with disease management and care coordination needs.    Engaged with patient by telephone for initial visit in response to provider referral for pharmacy case management and/or care coordination services.   Consent to Services:  The patient was given the following information about Chronic Care Management services today, agreed to services, and gave verbal consent: 1. CCM service includes personalized support from designated clinical staff supervised by the primary care provider, including individualized plan of care and coordination with other care providers 2. 24/7 contact phone numbers for assistance for urgent and routine care needs. 3. Service will only be billed when office clinical staff spend 20 minutes or more  in a month to coordinate care. 4. Only one practitioner may furnish and bill the service in a calendar month. 5.The patient may stop CCM services at any time (effective at the end of the month) by phone call to the office staff. 6. The patient will be responsible for cost sharing (co-pay) of up to 20% of the service fee (after annual deductible is met). Patient agreed to services and consent obtained.  Patient Care Team: [name], Aleksei V, MD as PCP - General (Internal Medicine) Olin, Matthew, MD as Consulting Physician (Orthopedic Surgery) Plovsky, Gerald, MD as Consulting Physician (Psychiatry) Brooks, Dahari, MD as Consulting Physician (Orthopedic Surgery) ,  C, RPH as Pharmacist (Pharmacist) Center, [facility] as Consulting Physician (Ophthalmology)  Recent office visits:  08/30/20 Aleksei V. [name] MD - Seen for vaccination - No medication changes - Follow up in 6 months    Recent consult visits:  10/31/20 - Richard Ramos- Pain medicine - notes not available  10/28/20 - Dahari Brooks - Pain medicine - notes not available  10/18/20 - Dahari Brooks - Pain medicine - notes not available  10/08/20 Rakesh V Alva - Pulmonary Care - Seen for OSA on CPAP - Ambulatory referral to DME - Rx for CPAP - No medication changes noted - Follow up in 6 months  05/30/20 - Carol Knowles - Chiropractic medicine - notes not available    Hospital visits:  None in previous 6 months  Objective:  Lab Results  Component Value Date   CREATININE 1.09 08/30/2020   BUN 19 08/30/2020   GFR 64.44 08/30/2020   GFRNONAA 87 12/20/2019   GFRAA 101 12/20/2019     NA 135 08/30/2020   K 4.8 08/30/2020   CALCIUM 9.5 08/30/2020   CO2 22 08/30/2020   GLUCOSE 77 08/30/2020    Lab Results  Component Value Date/Time   HGBA1C 4.7 12/20/2019 03:16 PM   HGBA1C 4.9 05/10/2019 03:02 PM   GFR 64.44 08/30/2020 03:11 PM   GFR 108.97 04/05/2019 02:46 PM   MICROALBUR 3.9 12/20/2019 03:16 PM     Last diabetic Eye exam:  No results found for: HMDIABEYEEXA  Last diabetic Foot exam:  No results found for: HMDIABFOOTEX   Lab Results  Component Value Date   CHOL 178 12/20/2019   HDL 37 (L) 12/20/2019   LDLCALC 120 (H) 12/20/2019   TRIG 99 12/20/2019   CHOLHDL 4.8 12/20/2019    Hepatic Function Latest Ref Rng & Units 08/30/2020 12/20/2019 10/19/2018  Total Protein 6.0 - 8.3 g/dL 7.3 7.1 6.9  Albumin 3.5 - 5.2 g/dL 4.4 - 4.4  AST 0 - 37 U/L 27 17 22  ALT 0 - 53 U/L 19 12 31  Alk Phosphatase 39 - 117 U/L 79 - 86  Total Bilirubin 0.2 - 1.2 mg/dL 0.9 0.7 0.8  Bilirubin, Direct 0.0 - 0.3 mg/dL - - 0.2    Lab Results  Component Value Date/Time   TSH 3.59 08/30/2020 03:11 PM   TSH 2.34 12/20/2019 03:16 PM    CBC Latest Ref Rng & Units 12/20/2019 05/24/2019 05/10/2019  WBC 3.8 - 10.8 Thousand/uL 10.6 9.5 7.3  Hemoglobin 13.2 - 17.1 g/dL 16.1 10.0(L) 14.0  Hematocrit 38.5 - 50.0 % 49.2 30.5(L) 42.1  Platelets 140 - 400 Thousand/uL 227 159 220    Lab Results  Component Value Date/Time   VD25OH 35.78 04/05/2019 02:46 PM   VD25OH 21 (L) 11/01/2008 09:33 PM    Clinical ASCVD: No  The ASCVD Risk score (Arnett DK, et al., 2019) failed to calculate for the following reasons:   The 2019 ASCVD risk score is only valid for ages 40 to 79    Depression screen PHQ 2/9 12/09/2020 11/14/2020 09/20/2018  Decreased Interest 0 0 1  Down, Depressed, Hopeless 0 0 1  PHQ - 2 Score 0 0 2  Altered sleeping - - 2  Tired, decreased energy - - 1  Change in appetite - - 2  Feeling bad or failure about yourself  - - 0  Trouble concentrating - - 0  Moving slowly or fidgety/restless - - 0  Suicidal thoughts - - 0  PHQ-9 Score - - 7  Difficult doing work/chores - - Not difficult at all  Some recent data might be hidden    Social History   Tobacco Use  Smoking Status Former   Packs/day: 0.50   Years: 12.00   Pack years: 6.00   Types: Cigarettes   Quit date: 03/31/1971   Years since quitting:  49.7  Smokeless Tobacco Never  Tobacco Comments   Started at age 14   BP Readings from Last 3 Encounters:  11/14/20 128/70  10/08/20 (!) 152/78  08/30/20 (!) 160/90   Pulse Readings from Last 3 Encounters:  11/14/20 (!) 50  10/08/20 (!) 53  08/30/20 (!) 49   Wt Readings from Last 3 Encounters:  11/14/20 198 lb 9.6 oz (90.1 kg)  10/08/20 218 lb (98.9 kg)  08/30/20 218 lb (98.9 kg)   BMI Readings from Last 3 Encounters:  11/14/20 26.94 kg/m  10/08/20 29.57 kg/m  08/30/20 29.57 kg/m    Assessment/Interventions: Review of patient past medical history, allergies, medications, health   status, including review of consultants reports, laboratory and other test data, was performed as part of comprehensive evaluation and provision of chronic care management services.   SDOH:  (Social Determinants of Health) assessments and interventions performed: Yes  SDOH Screenings   Alcohol Screen: Low Risk    Last Alcohol Screening Score (AUDIT): 0  Depression (PHQ2-9): Low Risk    PHQ-2 Score: 0  Financial Resource Strain: Low Risk    Difficulty of Paying Living Expenses: Not hard at all  Food Insecurity: No Food Insecurity   Worried About Running Out of Food in the Last Year: Never true   Ran Out of Food in the Last Year: Never true  Housing: Low Risk    Last Housing Risk Score: 0  Physical Activity: Inactive   Days of Exercise per Week: 0 days   Minutes of Exercise per Session: 0 min  Social Connections: Socially Integrated   Frequency of Communication with Friends and Family: More than three times a week   Frequency of Social Gatherings with Friends and Family: More than three times a week   Attends Religious Services: 1 to 4 times per year   Active Member of Clubs or Organizations: Yes   Attends Club or Organization Meetings: 1 to 4 times per year   Marital Status: Married  Stress: No Stress Concern Present   Feeling of Stress : Not at all  Tobacco Use: Medium Risk   Smoking  Tobacco Use: Former   Smokeless Tobacco Use: Never  Transportation Needs: No Transportation Needs   Lack of Transportation (Medical): No   Lack of Transportation (Non-Medical): No    CCM Care Plan  Allergies  Allergen Reactions   Abilify [Aripiprazole]     SOB feeling   Cymbalta [Duloxetine Hcl]     constipation   Digoxin And Related     HR 41   Duloxetine     REACTION: constipation   Prednisolone     Edema, wt gain   Temazepam     Medications Reviewed Today     Reviewed by Hatfield, Shenika N, LPN (Licensed Practical Nurse) on 11/14/20 at 1447  Med List Status: <None>   Medication Order Taking? Sig Documenting Provider Last Dose Status Informant  amLODipine (NORVASC) 5 MG tablet [id number] No Take 1 tablet (5 mg total) by mouth daily. [name], Aleksei V, MD Taking Active   Cholecalciferol (VITAMIN D3) 1000 UNIT tablet [id number] No Take 1,000 Units by mouth daily.   [provider] Taking Active Self           Med Note (HARRIS, YOLINDA T   Mon May 08, 2019 12:19 PM) On hold until patient purchases more  ciclopirox (PENLAC) 8 % solution [id number] No APPLY OVER NAIL AND SURROUNDING SKIN. APPLY DAILY OVER PREVIOUS COAT. AFTER SEVEN (7) DAYS, MAY REMOVE WITH ALCOHOL AND AND CONTINUE CYCLE. [name], Aleksei V, MD Taking Active   clotrimazole-betamethasone (LOTRISONE) cream [id number] No APPLY TO AFFECTED AREA 2 TIMES A DAY. [name], Aleksei V, MD Taking Active   Cyanocobalamin (VITAMIN B-12 CR) 1000 MCG TBCR [id number] No Take 1,000 mcg by mouth daily.   [provider] Taking Active Self           Med Note (HARRIS, YOLINDA T   Mon May 08, 2019 12:19 PM) On hold until patient purchases more  docusate sodium (COLACE) 100 MG capsule [id number] No Take 1 capsule (100 mg total) by mouth 2 (two) times daily. Babish, Matthew, PA-C Taking Active   dutasteride (  AVODART) 0.5 MG capsule [id number] No Take 1 capsule (0.5 mg total) by mouth daily. [name], Aleksei V, MD Taking  Active   ferrous sulfate (FERROUSUL) 325 (65 FE) MG tablet [id number]  Take 1 tablet (325 mg total) by mouth 3 (three) times daily with meals for 14 days. Babish, Matthew, PA-C  Active            Med Note (JOYCE, CYNTHIA A   Fri Aug 30, 2020  2:26 PM) Not taking  FLUoxetine (PROZAC) 40 MG capsule [id number] No TAKE 1 CAPSULE BY MOUTH DAILY. [name], Aleksei V, MD Taking Active   gabapentin (NEURONTIN) 300 MG capsule [id number]  TAKE 1 CAPSULE BY MOUTH THREE TIMES A DAY [name], Aleksei V, MD  Active   ketoconazole (NIZORAL) 2 % shampoo [id number] No APPLY AS DIRECTED 2 TIMES A WEEK [name], Aleksei V, MD Taking Active   morphine (MS CONTIN) 30 MG 12 hr tablet [id number] No Take 1 tablet (30 mg total) by mouth every 12 (twelve) hours. [name], Aleksei V, MD Taking Active            Med Note (JOYCE, CYNTHIA A   Fri Aug 30, 2020  2:27 PM) Every 8 hours  Polyethyl Glycol-Propyl Glycol (LUBRICANT EYE DROPS) 0.4-0.3 % SOLN [id number] No Place 1 drop into both eyes 3 (three) times daily as needed (dry/irritated eyes.). [provider] Taking Active Self  polyethylene glycol (MIRALAX / GLYCOLAX) 17 g packet [id number] No Take 17 g by mouth 2 (two) times daily. Babish, Matthew, PA-C Taking Active   propranolol (INDERAL) 80 MG tablet [id number] No TAKE 1 TABLET BY MOUTH 3 TIMES DAILY. [name], Aleksei V, MD Taking Active   testosterone cypionate (DEPOTESTOSTERONE CYPIONATE) 200 MG/ML injection [id number]  INJECT 3 MILLILITERS INTO THE MUSCLE EVERY 14 DAYS [name], Aleksei V, MD  Active   tiZANidine (ZANAFLEX) 4 MG tablet [id number] No TAKE 1 TABLET BY MOUTH EVERY 8 HOURS AS NEEDED FOR MUSCLE SPASMS. [name], Aleksei V, MD Taking Active   traZODone (DESYREL) 50 MG tablet [id number] No TAKE 1 TO 2 TABLETS BY MOUTH AT BEDTIME. [name], Aleksei V, MD Taking Active             Patient Active Problem List   Diagnosis Date Noted   S/P hip replacement, left 05/23/2019   Ear pain, right 04/05/2019    Leg weakness, bilateral 02/01/2019   Arm weakness 04/07/2018   BPH (benign prostatic hyperplasia) 04/23/2017   Hip pain, chronic, left 05/06/2016   Delusional thoughts (HCC) 05/06/2016   CTS (carpal tunnel syndrome) 07/29/2015   Ulnar neuropathy 07/29/2015   Generalized anxiety disorder 07/29/2015   Preop exam for internal medicine 01/28/2015   Insomnia 04/13/2014   Left knee pain 10/10/2013   Delusional disorder (HCC) 10/10/2013   Overweight (BMI 25.0-29.9) 03/15/2013   S/P total hip arthroplasty 03/14/2013   Hip osteoarthritis 03/06/2013   Bradycardia 10/09/2012   Sinusitis, acute 06/29/2012   Actinic keratoses 05/24/2012   Hyponatremia 10/23/2011   Edema 10/23/2011   Shoulder pain, acute 07/13/2011   Arthralgia 06/08/2011   Cervical pain 01/28/2011   Neoplasm of uncertain behavior of skin 06/16/2010   BRADYCARDIA 03/17/2010   SINUSITIS, ACUTE 12/11/2009   PAIN IN THORACIC SPINE 04/26/2009   TOBACCO USE, QUIT 02/19/2009   OSA on CPAP 11/15/2008   Hypogonadism male 05/02/2008   Vitamin B12 deficiency 08/26/2007   VISUAL CHANGES 08/25/2007   OTHER CONVULSIONS 08/25/2007   SLEEP DEPRIVATION 08/25/2007   PALPITATIONS 08/25/2007   HIP PAIN 04/29/2007     Osteoarthritis 02/15/2007   Dyslipidemia 02/14/2007   Depression 02/14/2007   Atrial fibrillation (HCC) 02/14/2007   Seborrheic dermatitis 02/14/2007   Diabetes mellitus type 2, diet-controlled (HCC) 01/11/2007   Essential hypertension 01/11/2007   LOW BACK PAIN 01/11/2007    Immunization History  Administered Date(s) Administered   Fluad Quad(high Dose 65+) 11/22/2018, 12/20/2019   Influenza Split 01/28/2011, 12/18/2011   Influenza Whole 02/14/2007, 12/11/2009   Influenza, High Dose Seasonal PF 02/03/2016, 12/30/2016, 01/04/2018   Influenza,inj,Quad PF,6+ Mos 01/03/2013, 01/10/2014, 12/05/2014   PFIZER(Purple Top)SARS-COV-2 Vaccination 04/19/2019, 05/10/2019, 12/24/2019, 07/05/2020   PNEUMOCOCCAL CONJUGATE-20  08/30/2020   Pneumococcal Conjugate-13 05/08/2013   Pneumococcal Polysaccharide-23 08/27/2009   Td 03/30/2008   Zoster, Live 04/13/2014    Conditions to be addressed/monitored:  Hypertension, Hyperlipidemia, Depression, BPH, Chronic Back Pain, and Hypogonadism   Care Plan : CCM Care Plan  Updates made by ,  C, RPH since 12/09/2020 12:00 AM     Problem: Hypertension, Hyperlipidemia, Depression, BPH, Chronic Back Pain, and Hypogonadism   Priority: High  Onset Date: 12/09/2020     Long-Range Goal: Disease Management   Start Date: 12/09/2020  Expected End Date: 06/08/2021  This Visit's Progress: On track  Priority: High  Note:   Current Barriers:  Unable to independently monitor therapeutic efficacy Unable to achieve control of cholesterol   Pharmacist Clinical Goal(s):  Patient will achieve adherence to monitoring guidelines and medication adherence to achieve therapeutic efficacy achieve improvement in LDL as evidenced by next lipid panel through collaboration with PharmD and provider.   Interventions: 1:1 collaboration with [name], Aleksei V, MD regarding development and update of comprehensive plan of care as evidenced by provider attestation and co-signature Inter-disciplinary care team collaboration (see longitudinal plan of care) Comprehensive medication review performed; medication list updated in electronic medical record  Hypertension (BP goal <140/90) -Controlled -Current treatment: Amlodipine 5mg - 1 tablet daily  Propranolol 80mg - 1 tablet 3 times daily  -Medications previously tried: amlodipine 10mg daily (caused edema) , benazepril, HCTZ, torsemide  -Current home readings: reports that blood pressures average 130/84 -Current dietary habits: reports to drinking 1 cup of coffee daily, does not use much salt  -Current exercise habits: minimal at this time due to back pain -Denies hypotensive/hypertensive symptoms -Educated on BP goals and benefits  of medications for prevention of heart attack, stroke and kidney damage; Daily salt intake goal < 2300 mg; Exercise goal of 150 minutes per week; Importance of home blood pressure monitoring; Proper BP monitoring technique; -Counseled to monitor BP at home 1-2 times weekly, document, and provide log at future appointments -Counseled on diet and exercise extensively Recommended to continue current medication  Hyperlipidemia: (LDL goal < 100) -Not ideally controlled Lab Results  Component Value Date   LDLCALC 120 (H) 12/20/2019  -Current treatment: N/a - has refused statins in the past -Medications previously tried: aspirin  -Current dietary patterns: reports that he is trying to reduce amount of fried/ fatty foods he is eating - has lost 40lbs over the past year form dietary changes -Current exercise habits: limited at this time due to back pain  -Educated on Cholesterol goals;  Benefits of statin for ASCVD risk reduction; Importance of limiting foods high in cholesterol; -Counseled on diet and exercise extensively Recommended for patient to start rosuvastatin 5mg daily   Depression/Insomnia (Goal: promotion of positive mood/ improved sleep quality) -Controlled -Current treatment: Fluoxetine 40mg - 1 capsule daily  Trazodone 50mg - 1-2 tablets at bedtime  -Medications previously tried/failed: bupropion, abilify,   cymbalta, temazepam, clonazepam  -PHQ2 -9: 0 -Educated on Benefits of medication for symptom control Benefits of cognitive-behavioral therapy with or without medication -Recommended to continue current medication  Chronic Pain (Goal: Pain control) - Managed by Dr. Nelva Bush -Not ideally controlled -last MRI shows - severe canal stenosis, severe foraminal stenosis, in lumbar vertebrae  -Current treatment  Morphine Sulfate ER 33m - 1 tablet every 8 hours Gabapentin 3025m- 1 capsule 3 times daily  Tizanidine 54m79m 1 tablet every 8 hours as needed  Acetaminophen 500m36m2  tablets daily  -Medications previously tried: cyclobenzaprine, norco, ibuprofen  - will defer pain management to Dr. RamoNelva Bushatient reports that he is scheduled for surgery 10/5 with Dr. BrooRolena Infantepinal fusion surgery (L4-L5-L6)  Hypogonadism (Goal: Maintenance of appropriate testosterone levels) -Controlled Lab Results  Component Value Date   TESTOSTERONE 166.24 (L) 10/19/2018  -Current treatment  Testosterone cypionate 200mg66m- inject 3 mL every 12 days  -Medications previously tried: n/a  -Recommended to continue current medication'  BPH (Goal: Prevention of disease progression/ treatment of urinary symptoms) -Controlled -Current treatment  Dutasteride 0.5mg -22mcapsule daily  -Medications previously tried: n/a  -Recommended to continue current medication  Health Maintenance -Vaccine gaps: Shingles, Influenza, COVID booster -Current therapy:  Ciclopirox 8% solution - applied to nails daily x 7 days cycles Clotrimazole-Betamethasone Cream - applied twice daily as needed  Docusate 100mg -13mapsule twice daily  Miralax - 17g twice daily  Lubricant eye drops - 1 drop into each eye 3 times daily as needed  Vitamin B12 1000mcg -16mablet daily  Lab Results  Component Value Date   VITAMINB12 657 04/05/2019  Vitamin D3 1000units - 1 tablet daily  Lab Results  Component Value Date   VD25OH 35.78 04/05/2019  -Educated on Cost vs benefit of each product must be carefully weighed by individual consumer -Patient is satisfied with current therapy and denies issues -Recommended to continue current medication  Patient Goals/Self-Care Activities Patient will:  - take medications as prescribed check blood pressure 1-2 times weekly, document, and provide at future appointments -Continue dietary changes, reducing fried and fatty foods / increase fresh ingredients and lean proteins   Follow Up Plan: Telephone follow up appointment with care management team member scheduled for: The  patient has been provided with contact information for the care management team and has been advised to call with any health related questions or concerns.        Medication Assistance: None required.  Patient affirms current coverage meets needs.  [phone] box? Yes Pt endorses 100% compliance  Care Plan and Follow Up Patient Decision:  Patient agrees to Care Plan and Follow-up.  Plan: Telephone follow up appointment with care management team member scheduled for:  4 months and The patient has been provided with contact information for the care management team and has been advised to call with any health related questions or concerns.   Stonewall Doss CTomasa Blase Clinical Pharmacist, [place] examination/treatment/procedure(s) were performed by non-physician practitioner and as supervising physician I was immediately available for consultation/collaboration.  I agree with above. Aleksei Lew Dawes

## 2020-12-11 NOTE — Telephone Encounter (Signed)
   Spouse and Dr Rolena Infante office calling for status of clearance form

## 2020-12-12 ENCOUNTER — Other Ambulatory Visit: Payer: Self-pay | Admitting: Internal Medicine

## 2020-12-12 NOTE — Telephone Encounter (Signed)
Called Seth Bake @ [facility] there was no answer LMOM RTC.Marland KitchenJohny Chess

## 2020-12-12 NOTE — Telephone Encounter (Signed)
Seth Bake called back she states Vernon Bishop is schedule for back surgery 01/22/21, and is needing medical clearance. She states form was faxed on yesterday. Inform Seth Bake I will call pt to schedule surgical clearance appt.Marland KitchenJohny Chess

## 2020-12-13 ENCOUNTER — Ambulatory Visit: Payer: Self-pay | Admitting: Orthopedic Surgery

## 2020-12-13 NOTE — Telephone Encounter (Signed)
Called pt made medical clearance appt for 01/06/21.Marland KitchenJohny Chess

## 2020-12-27 DIAGNOSIS — I10 Essential (primary) hypertension: Secondary | ICD-10-CM

## 2020-12-27 DIAGNOSIS — F339 Major depressive disorder, recurrent, unspecified: Secondary | ICD-10-CM | POA: Diagnosis not present

## 2020-12-27 DIAGNOSIS — E785 Hyperlipidemia, unspecified: Secondary | ICD-10-CM | POA: Diagnosis not present

## 2021-01-06 ENCOUNTER — Other Ambulatory Visit: Payer: Self-pay

## 2021-01-06 ENCOUNTER — Encounter: Payer: Self-pay | Admitting: Internal Medicine

## 2021-01-06 ENCOUNTER — Ambulatory Visit (INDEPENDENT_AMBULATORY_CARE_PROVIDER_SITE_OTHER): Payer: PPO | Admitting: Internal Medicine

## 2021-01-06 VITALS — BP 132/84 | HR 54 | Temp 98.7°F | Ht 72.0 in | Wt 210.2 lb

## 2021-01-06 DIAGNOSIS — E119 Type 2 diabetes mellitus without complications: Secondary | ICD-10-CM

## 2021-01-06 DIAGNOSIS — M5440 Lumbago with sciatica, unspecified side: Secondary | ICD-10-CM

## 2021-01-06 DIAGNOSIS — Z01818 Encounter for other preprocedural examination: Secondary | ICD-10-CM

## 2021-01-06 DIAGNOSIS — R002 Palpitations: Secondary | ICD-10-CM | POA: Diagnosis not present

## 2021-01-06 DIAGNOSIS — E538 Deficiency of other specified B group vitamins: Secondary | ICD-10-CM | POA: Diagnosis not present

## 2021-01-06 DIAGNOSIS — I480 Paroxysmal atrial fibrillation: Secondary | ICD-10-CM | POA: Diagnosis not present

## 2021-01-06 DIAGNOSIS — I272 Pulmonary hypertension, unspecified: Secondary | ICD-10-CM | POA: Diagnosis not present

## 2021-01-06 DIAGNOSIS — E785 Hyperlipidemia, unspecified: Secondary | ICD-10-CM | POA: Diagnosis not present

## 2021-01-06 NOTE — Progress Notes (Signed)
Subjective:  Patient ID: Vernon Bishop, male    DOB: 1940-08-23  Age: 80 y.o. MRN: [id number]  CC: Medical Clearance   HPI Vernon Bishop presents for chronic pain  Pre-op eval for internal medicine Req by Dr Rolena Infante Reason -Vernon Bishop is planning to have a 2 day surgery Oct 26-27: Day #1 OLIF L4-L5, XLIF L2-L4. Day #2: Posterior spinal fusion L2-L5 -needs medical clearance History: Chronic low back pain, hypertension, diet-controlled diabetes, atrial fibrillation No recent issues: no CP, no DOE. Will get a 2D ECHO pre-op. CXR and labs      Outpatient Medications Prior to Visit  Medication Sig Dispense Refill   acetaminophen (TYLENOL) 500 MG tablet Take 1,000 mg by mouth every 8 (eight) hours as needed for moderate pain.     amLODipine (NORVASC) 5 MG tablet Take 1 tablet (5 mg total) by mouth daily. 90 tablet 3   Cholecalciferol (VITAMIN D3) 1000 UNIT tablet Take 1,000 Units by mouth daily.       ciclopirox (PENLAC) 8 % solution APPLY OVER NAIL AND SURROUNDING SKIN. APPLY DAILY OVER PREVIOUS COAT. AFTER SEVEN (7) DAYS, MAY REMOVE WITH ALCOHOL AND AND CONTINUE CYCLE. 6.6 mL 1   clotrimazole-betamethasone (LOTRISONE) cream APPLY TO AFFECTED AREA 2 TIMES A DAY. (Patient taking differently: Apply 1 application topically 2 (two) times daily as needed (rash).) 45 g 1   Cyanocobalamin (VITAMIN B-12 CR) 1000 MCG TBCR Take 1,000 mcg by mouth daily.       FLUoxetine (PROZAC) 40 MG capsule TAKE 1 CAPSULE BY MOUTH DAILY. 90 capsule 2   gabapentin (NEURONTIN) 300 MG capsule TAKE 1 CAPSULE BY MOUTH THREE TIMES A DAY 90 capsule 11   morphine (MS CONTIN) 30 MG 12 hr tablet Take 1 tablet (30 mg total) by mouth every 12 (twelve) hours. (Patient taking differently: Take 30 mg by mouth every 8 (eight) hours.) 60 tablet 0   Polyethyl Glycol-Propyl Glycol (LUBRICANT EYE DROPS) 0.4-0.3 % SOLN Place 1 drop into both eyes 3 (three) times daily as needed (dry/irritated eyes.).     polyethylene glycol (MIRALAX /  GLYCOLAX) 17 g packet Take 17 g by mouth 2 (two) times daily. (Patient taking differently: Take 17 g by mouth daily.) 28 packet 0   propranolol (INDERAL) 80 MG tablet TAKE 1 TABLET BY MOUTH 3 TIMES DAILY. 270 tablet 3   testosterone cypionate (DEPOTESTOSTERONE CYPIONATE) 200 MG/ML injection INJECT 3 MILLILITERS INTO THE MUSCLE EVERY 14 DAYS 10 mL 5   tiZANidine (ZANAFLEX) 4 MG tablet TAKE 1 TABLET BY MOUTH EVERY 8 HOURS AS NEEDED FOR MUSCLE SPASMS. 60 tablet 1   traZODone (DESYREL) 50 MG tablet TAKE 1 TO 2 TABLETS BY MOUTH AT BEDTIME. (Patient taking differently: Take 100 mg by mouth at bedtime.) 60 tablet 5   docusate sodium (COLACE) 100 MG capsule Take 1 capsule (100 mg total) by mouth 2 (two) times daily. (Patient taking differently: Take 100 mg by mouth daily.) 28 capsule 0   dutasteride (AVODART) 0.5 MG capsule Take 1 capsule (0.5 mg total) by mouth daily. 30 capsule 11   ketoconazole (NIZORAL) 2 % shampoo APPLY AS DIRECTED 2 TIMES A WEEK 120 mL 0   No facility-administered medications prior to visit.    ROS: Review of Systems  Constitutional:  Positive for fatigue. Negative for appetite change and unexpected weight change.  HENT:  Negative for congestion, nosebleeds, sneezing, sore throat and trouble swallowing.   Eyes:  Negative for itching and visual disturbance.  Respiratory:  Negative  for cough.   Cardiovascular:  Negative for chest pain, palpitations and leg swelling.  Gastrointestinal:  Negative for abdominal distention, blood in stool, diarrhea and nausea.  Genitourinary:  Negative for frequency and hematuria.  Musculoskeletal:  Positive for arthralgias, back pain and gait problem. Negative for joint swelling, neck pain and neck stiffness.  Skin:  Negative for rash.  Neurological:  Negative for dizziness, tremors, speech difficulty and weakness.  Psychiatric/Behavioral:  Negative for agitation, dysphoric mood and sleep disturbance. The patient is not nervous/anxious.     Objective:  BP 132/84 (BP Location: Left Arm)   Pulse (!) 54   Temp 98.7 F (37.1 C) (Oral)   Ht 6' (1.829 m)   Wt 210 lb 3.2 oz (95.3 kg)   SpO2 95%   BMI 28.51 kg/m   BP Readings from Last 3 Encounters:  01/14/21 (!) 152/79  01/07/21 (!) 182/74  01/06/21 132/84    Wt Readings from Last 3 Encounters:  01/14/21 211 lb 3.2 oz (95.8 kg)  01/07/21 210 lb (95.3 kg)  01/06/21 210 lb 3.2 oz (95.3 kg)    Physical Exam Constitutional:      General: He is not in acute distress.    Appearance: He is well-developed.     Comments: NAD  Eyes:     Conjunctiva/sclera: Conjunctivae normal.     Pupils: Pupils are equal, round, and reactive to light.  Neck:     Thyroid: No thyromegaly.     Vascular: No JVD.  Cardiovascular:     Rate and Rhythm: Normal rate and regular rhythm.     Heart sounds: Normal heart sounds. No murmur heard.   No friction rub. No gallop.  Pulmonary:     Effort: Pulmonary effort is normal. No respiratory distress.     Breath sounds: Normal breath sounds. No wheezing or rales.  Chest:     Chest wall: No tenderness.  Abdominal:     General: Bowel sounds are normal. There is no distension.     Palpations: Abdomen is soft. There is no mass.     Tenderness: There is no abdominal tenderness. There is no guarding or rebound.  Musculoskeletal:        General: Tenderness present. Normal range of motion.     Cervical back: Normal range of motion.  Lymphadenopathy:     Cervical: No cervical adenopathy.  Skin:    General: Skin is warm and dry.     Findings: No rash.  Neurological:     Mental Status: He is alert and oriented to person, place, and time.     Cranial Nerves: No cranial nerve deficit.     Motor: No abnormal muscle tone.     Coordination: Coordination normal.     Gait: Gait normal.     Deep Tendon Reflexes: Reflexes are normal and symmetric.  Psychiatric:        Behavior: Behavior normal.        Thought Content: Thought content normal.         Judgment: Judgment normal.   Lumbar spine-stiff with pain  Procedure: EKG Indication: pre-op Impression: S brady. No acute changes.  Lab Results  Component Value Date   WBC 5.8 01/14/2021   HGB 15.4 01/14/2021   HCT 46.9 01/14/2021   PLT 174 01/14/2021   GLUCOSE 79 01/14/2021   CHOL 178 12/20/2019   TRIG 99 12/20/2019   HDL 37 (L) 12/20/2019   LDLCALC 120 (H) 12/20/2019   ALT 19 08/30/2020   AST  27 08/30/2020   NA 138 01/14/2021   K 3.9 01/14/2021   CL 101 01/14/2021   CREATININE 1.01 01/14/2021   BUN 18 01/14/2021   CO2 28 01/14/2021   TSH 3.59 08/30/2020   PSA 0.73 12/20/2019   INR 1.0 01/14/2021   HGBA1C 4.9 01/14/2021   MICROALBUR 3.9 12/20/2019    MR LUMBAR SPINE WO CONTRAST  Result Date: 10/24/2020 CLINICAL DATA:  Low back pain radiating down left leg for 1 year. EXAM: MRI LUMBAR SPINE WITHOUT CONTRAST TECHNIQUE: Multiplanar, multisequence MR imaging of the lumbar spine was performed. No intravenous contrast was administered. COMPARISON:  Lumbar radiographs over 4, 2020. FINDINGS: Segmentation: Stairs a mentation is assumed. The inferior-most fully formed intervertebral disc is labeled L5-S1. Alignment:  Levocurvature.  Grade 1 anterolisthesis of L4 on L5. Vertebrae: Discogenic/degenerative endplate signal changes at multiple levels, most conspicuous at L3-L4. Heterogeneous marrow without suspicious bone lesion. No specific evidence of acute fracture or discitis/osteomyelitis. Conus medullaris and cauda equina: Conus extends to the L1 level. Conus appears normal. Paraspinal and other soft tissues: Psoas muscle atrophy. Disc levels: T12-L1: Broad disc bulge with superimposed central disc protrusion. Bilateral facet hypertrophy. No significant canal or foraminal stenosis. L1-L2: Broad disc bulge with moderate right and mild left facet hypertrophy. Prominent dorsal epidural fat. Resulting moderate right foraminal stenosis and moderate right subarticular recess stenosis with  mild to moderate central canal stenosis. L2-L3: Broad disc bulge with large right subarticular/foraminal disc protrusion and severe right subarticular recess narrowing. Moderate central canal stenosis. There is mild-to-moderate right foraminal stenosis with right far lateral/extraforaminal disc closely approximating the exiting/exited right L2 nerve. Mild left foraminal stenosis. L3-L4: Broad disc bulge with superimposed left subarticular/foraminal disc protrusion and severe left greater than right facet hypertrophy. Resulting severe canal stenosis and moderate to severe left foraminal stenosis. Mild right foraminal stenosis. L4-L5: Grade 1 anterolisthesis of L4 on L5 with broad disc bulge, severe bilateral facet hypertrophy. Resulting severe bilateral subarticular recess stenosis with mild to moderate canal stenosis. Moderate left foraminal stenosis. L5-S1: Posterior disc and endplate spurring. Moderate right greater than left facet hypertrophy. Mild bilateral subarticular recess narrowing without significant canal stenosis. Moderate left foraminal stenosis with left far lateral osteophyte contacting the exiting left L5 nerve. IMPRESSION: 1. At L3-L4, severe canal and left subarticular recess stenosis with moderate to severe left foraminal stenosis. 2. At L2-L3, severe right subarticular recess stenosis, moderate canal stenosis, and mild-to-moderate right foraminal stenosis with right far lateral/extraforaminal disc closely approximating the exiting/exited right L2 nerve. 3. At L4-L5, grade 1 anterolisthesis with severe bilateral subarticular recess stenosis, mild to moderate canal stenosis, and moderate left foraminal stenosis. 4. At L5-S1, moderate left foraminal stenosis with left far lateral osteophyte contacting the exiting left L5 nerve. 5. At L1-L2, moderate right subarticular and foraminal stenosis with mild to moderate canal stenosis. Electronically Signed   By: Margaretha Sheffield MD   On: 10/24/2020 17:23     Assessment & Plan:   Problem List Items Addressed This Visit     Atrial fibrillation (HCC)    ASA 162 mg/d      Relevant Medications   furosemide (LASIX) 20 MG tablet   Other Relevant Orders   ECHOCARDIOGRAM COMPLETE   EKG 12-Lead (Completed)   Diabetes mellitus type 2, diet-controlled (HCC)    Check A1c      Relevant Orders   Comprehensive metabolic panel   Hemoglobin A1c   Protime-INR   TSH   Urinalysis   Dyslipidemia  Lipids, TSH      Relevant Orders   TSH   LOW BACK PAIN    No recent issues: no CP, no DOE. Will get a 2D ECHO pre-op. CXR and labs Vernon Bishop should be medically clear for  his 2 day surgery Oct 26-27: Day #1 OLIF L4-L5, XLIF L2-L4. Day #2: Posterior spinal fusion L2-L5. Please follow your routine perioperative protocols as usual.  I would recommend that hospitalist is seeing Tom while in the hospital to monitor his cardiovascular and pulmonary state. Thank you      Palpitations    No recent issues. Will get a 2D ECHO pre-op      Relevant Orders   ECHOCARDIOGRAM COMPLETE (Completed)   DG Chest 2 View (Completed)   Comprehensive metabolic panel   CBC with Differential/Platelet   Hemoglobin A1c   Protime-INR   TSH   Urinalysis   Preop exam for internal medicine - Primary    Tom should be medically clear for  his 2 day surgery Oct 26-27: Day #1 OLIF L4-L5, XLIF L2-L4. Day #2: Posterior spinal fusion L2-L5. No recent clinical issues: no CP, no DOE. Will get a 2D ECHO pre-op. CXR and labs Please follow your routine perioperative protocols as usual.  I would recommend that hospitalist is seeing Tom while in the hospital to monitor his cardiovascular and pulmonary state. Thank you      Relevant Orders   ECHOCARDIOGRAM COMPLETE (Completed)   DG Chest 2 View (Completed)   Comprehensive metabolic panel   CBC with Differential/Platelet   Hemoglobin A1c   Protime-INR   TSH   Urinalysis   ECHOCARDIOGRAM COMPLETE   EKG 12-Lead (Completed)    Pulmonary hypertension (HCC)    No recent clinical issues: no CP, no DOE.  2D ECHO , CXR - started on furosemide.  Will involve hospitalist in Tom's perioperative care.      Relevant Medications   furosemide (LASIX) 20 MG tablet   Vitamin B12 deficiency    On B12         Follow-up: No follow-ups on file.  Walker Kehr, MD

## 2021-01-06 NOTE — Assessment & Plan Note (Signed)
Lipids , TSH 

## 2021-01-06 NOTE — Assessment & Plan Note (Signed)
No recent issues. Will get a 2D ECHO pre-op

## 2021-01-06 NOTE — Assessment & Plan Note (Signed)
On B12 

## 2021-01-06 NOTE — Assessment & Plan Note (Signed)
Check A1c. 

## 2021-01-06 NOTE — Assessment & Plan Note (Addendum)
No recent issues: no CP, no DOE. Will get a 2D ECHO pre-op. CXR and labs [name] should be medically clear for  his 2 day surgery Oct 26-27: Day #1 OLIF L4-L5, XLIF L2-L4. Day #2: Posterior spinal fusion L2-L5. Please follow your routine perioperative protocols as usual.  I would recommend that hospitalist is seeing Vernon Bishop while in the hospital to monitor his cardiovascular and pulmonary state. Thank you

## 2021-01-06 NOTE — Assessment & Plan Note (Signed)
ASA 162 mg/d 

## 2021-01-07 ENCOUNTER — Encounter: Payer: Self-pay | Admitting: Vascular Surgery

## 2021-01-07 ENCOUNTER — Ambulatory Visit: Payer: Self-pay | Admitting: Orthopedic Surgery

## 2021-01-07 ENCOUNTER — Ambulatory Visit (INDEPENDENT_AMBULATORY_CARE_PROVIDER_SITE_OTHER): Payer: PPO | Admitting: Vascular Surgery

## 2021-01-07 DIAGNOSIS — G8929 Other chronic pain: Secondary | ICD-10-CM

## 2021-01-07 DIAGNOSIS — M545 Low back pain, unspecified: Secondary | ICD-10-CM | POA: Diagnosis not present

## 2021-01-07 NOTE — H&P (Signed)
Subjective:   Left foot drop at baseline Pain management with [facility]. XLIF L2-4, L4-5 CONE 01/22/21-01/23/21  Patient Active Problem List   Diagnosis Date Noted   Chronic back pain 01/07/2021   S/P hip replacement, left 05/23/2019   Ear pain, right 04/05/2019   Leg weakness, bilateral 02/01/2019   Arm weakness 04/07/2018   BPH (benign prostatic hyperplasia) 04/23/2017   Hip pain, chronic, left 05/06/2016   Delusional thoughts ([place]) 05/06/2016   CTS (carpal tunnel syndrome) 07/29/2015   Ulnar neuropathy 07/29/2015   Generalized anxiety disorder 07/29/2015   Preop exam for internal medicine 01/28/2015   Insomnia 04/13/2014   Left knee pain 10/10/2013   Delusional disorder ([place]) 10/10/2013   Overweight (BMI 25.0-29.9) 03/15/2013   S/P total hip arthroplasty 03/14/2013   Hip osteoarthritis 03/06/2013   Bradycardia 10/09/2012   Sinusitis, acute 06/29/2012   Actinic keratoses 05/24/2012   Hyponatremia 10/23/2011   Edema 10/23/2011   Shoulder pain, acute 07/13/2011   Arthralgia 06/08/2011   Cervical pain 01/28/2011   Neoplasm of uncertain behavior of skin 06/16/2010   BRADYCARDIA 03/17/2010   SINUSITIS, ACUTE 12/11/2009   PAIN IN THORACIC SPINE 04/26/2009   TOBACCO USE, QUIT 02/19/2009   OSA on CPAP 11/15/2008   Hypogonadism male 05/02/2008   Vitamin B12 deficiency 08/26/2007   VISUAL CHANGES 08/25/2007   OTHER CONVULSIONS 08/25/2007   SLEEP DEPRIVATION 08/25/2007   Palpitations 08/25/2007   HIP PAIN 04/29/2007   Osteoarthritis 02/15/2007   Dyslipidemia 02/14/2007   Depression 02/14/2007   Atrial fibrillation ([place]) 02/14/2007   Seborrheic dermatitis 02/14/2007   Diabetes mellitus type 2, diet-controlled ([place]) 01/11/2007   Essential hypertension 01/11/2007   LOW BACK PAIN 01/11/2007   Past Medical History:  Diagnosis Date   Arthralgia    Atrial fibrillation ([place])    1992   Benign prostatic hyperplasia    DDD (degenerative disc disease)    Depression     Diabetes mellitus    type II  - PT STATES NOT ON ANY DIABETIC MEDICINES   Dysrhythmia    aFIB   Dyssomnia    ED (erectile dysfunction)    Hyperlipidemia    Hypertension    Hypogonadism male    Joint pain    LBP (low back pain)    Multiple actinic keratoses    Muscle weakness    Upper limb   MVP (mitral valve prolapse)    Neck pain    OSA on CPAP    cpap   Osteoarthritis of hip    Osteoarthritis, hand    SEVERE LOWER BACK PAIN, AND RESTLESS LEG SYNDROME   Otalgia of right ear    Pain in left knee    Pain in thoracic spine    Palpitations    PTSD (post-traumatic stress disorder)    Seizure (HCC)    Shoulder pain    Subjective visual disturbance    Swallowing problem    HX OF PILLS GETTING "STUCK" IN THROAT AT TIMES   Swelling    Type 2 diabetes mellitus ([place])    Vitamin D deficiency     Past Surgical History:  Procedure Laterality Date   APPENDECTOMY     BACK SURGERY  1987   CATARACT EXTRACTION     CERVICAL LAMINECTOMY  2004   [name], rod was placed- HAS ROM LIMITATIONS   CHOLECYSTECTOMY     COLONOSCOPY     KNEE SURGERY     left knee/arthroscopic   TOTAL HIP ARTHROPLASTY Right 03/14/2013   Procedure: RIGHT  TOTAL HIP ARTHROPLASTY ANTERIOR APPROACH;  Surgeon: [name], MD;  Location: [facility];  Service: Orthopedics;  Laterality: Right;   TOTAL HIP ARTHROPLASTY Left 05/23/2019   Procedure: TOTAL HIP ARTHROPLASTY ANTERIOR APPROACH;  Surgeon: Paralee Cancel, MD;  Location: [facility];  Service: Orthopedics;  Laterality: Left;  70 mins    Current Outpatient Medications  Medication Sig Dispense Refill Last Dose   acetaminophen (TYLENOL) 500 MG tablet Take 1,000 mg by mouth daily.      amLODipine (NORVASC) 5 MG tablet Take 1 tablet (5 mg total) by mouth daily. 90 tablet 3    Cholecalciferol (VITAMIN D3) 1000 UNIT tablet Take 1,000 Units by mouth daily.        ciclopirox (PENLAC) 8 % solution APPLY OVER NAIL AND SURROUNDING SKIN. APPLY DAILY OVER PREVIOUS COAT. AFTER SEVEN  (7) DAYS, MAY REMOVE WITH ALCOHOL AND AND CONTINUE CYCLE. 6.6 mL 1    clotrimazole-betamethasone (LOTRISONE) cream APPLY TO AFFECTED AREA 2 TIMES A DAY. 45 g 1    Cyanocobalamin (VITAMIN B-12 CR) 1000 MCG TBCR Take 1,000 mcg by mouth daily.        docusate sodium (COLACE) 100 MG capsule Take 1 capsule (100 mg total) by mouth 2 (two) times daily. 28 capsule 0    dutasteride (AVODART) 0.5 MG capsule Take 1 capsule (0.5 mg total) by mouth daily. 30 capsule 11    FLUoxetine (PROZAC) 40 MG capsule TAKE 1 CAPSULE BY MOUTH DAILY. 90 capsule 2    gabapentin (NEURONTIN) 300 MG capsule TAKE 1 CAPSULE BY MOUTH THREE TIMES A DAY 90 capsule 11    ketoconazole (NIZORAL) 2 % shampoo APPLY AS DIRECTED 2 TIMES A WEEK 120 mL 0    morphine (MS CONTIN) 30 MG 12 hr tablet Take 1 tablet (30 mg total) by mouth every 12 (twelve) hours. (Patient taking differently: Take 30 mg by mouth every 8 (eight) hours.) 60 tablet 0    Polyethyl Glycol-Propyl Glycol (LUBRICANT EYE DROPS) 0.4-0.3 % SOLN Place 1 drop into both eyes 3 (three) times daily as needed (dry/irritated eyes.).      polyethylene glycol (MIRALAX / GLYCOLAX) 17 g packet Take 17 g by mouth 2 (two) times daily. (Patient taking differently: Take 17 g by mouth every other day.) 28 packet 0    propranolol (INDERAL) 80 MG tablet TAKE 1 TABLET BY MOUTH 3 TIMES DAILY. 270 tablet 3    testosterone cypionate (DEPOTESTOSTERONE CYPIONATE) 200 MG/ML injection INJECT 3 MILLILITERS INTO THE MUSCLE EVERY 14 DAYS 10 mL 5    tiZANidine (ZANAFLEX) 4 MG tablet TAKE 1 TABLET BY MOUTH EVERY 8 HOURS AS NEEDED FOR MUSCLE SPASMS. 60 tablet 1    traZODone (DESYREL) 50 MG tablet TAKE 1 TO 2 TABLETS BY MOUTH AT BEDTIME. 60 tablet 5    No current facility-administered medications for this visit.   Allergies  Allergen Reactions   Abilify [Aripiprazole]     SOB feeling   Cymbalta [Duloxetine Hcl]     constipation   Digoxin And Related     HR 41   Duloxetine     REACTION: constipation    Prednisolone     Edema, wt gain   Temazepam     Social History   Tobacco Use   Smoking status: Former    Packs/day: 0.50    Years: 12.00    Pack years: 6.00    Types: Cigarettes    Quit date: 03/31/1971    Years since quitting: 49.8   Smokeless tobacco:  Never   Tobacco comments:    Started at age 89  Substance Use Topics   Alcohol use: Yes    Alcohol/week: 2.0 standard drinks    Types: 2 Cans of beer per week    Comment: occasionally    Family History  Problem Relation Age of Onset   Allergies Mother    Stroke Mother        Clotting disorders   Heart disease Mother    Heart disease Father    Heart attack Father 63   Heart disease Brother    Rheum arthritis Paternal Grandmother    Other Paternal Grandmother        Rheumatism   Coronary artery disease Other    Diabetes Other    Hypertension Other    Esophageal cancer Maternal Aunt    Colon cancer Neg Hx    Rectal cancer Neg Hx    Stomach cancer Neg Hx     Review of Systems Pertinent items are noted in HPI.  Objective:   Vitals: Ht: 6 ft 01/07/2021 03:17 pm BP: 160/78 01/07/2021 03:17 pm Pulse: 48 bpm 01/07/2021 03:20 pm  General: Alert and oriented 3  Ambulation: Abnormal uses a left AFO brace and cane for ambulation  Heart: Bradycardic regular rhythm no rubs, murmurs, or gallops  Lungs: Clear auscultation bilaterally  Abdomen: Bowel sounds 4, nondistended, nontender, no rebound tenderness. No loss of bladder or bowel control  Neuro: He continues to have severe debilitating back pain with bilateral lower extremity numbness and dysesthesias as well as a chronic left foot drop. X-rays confirm multilevel degenerative disc disease with degenerative scoliosis as well as a degenerative spondylolisthesis at L4-5  Lumbar MRI: completed on 10/23/20 was reviewed with the patient. It was completed at [facility]; I have independently reviewed the images as well as the radiology report. No significant canal or  foraminal stenosis T12-L1. Mild to moderate degenerative changes at L1/2. Large right hard disc osteophyte at L2-3 producing moderate central canal stenosis and moderate to severe foraminal stenosis. L3-4: Severe central canal stenosis and moderate to severe left foraminal stenosis. Grade 1 anterolisthesis at L4-5 producing severe bilateral lateral recess stenosis. Moderate to severe degenerative disc disease L2-5. Moderate left foraminal stenosis at L5-S1 with some compression to the exiting L5 nerve root. Mild central stenosis.  Assessment:   Tibor is a very pleasant 80 year old gentleman with chronic debilitating back buttock and bilateral neuropathic leg pain. He has had a chronic foot drop for more than a year now. At this point time despite pain medical management his quality-of-life his deteriorated. He returns today to see me. Imaging studies demonstrate degenerative scoliosis with primarily 3 level pathology L2-5. In order to address his deformity and spondylolisthesis as well as the foraminal stenosis he will require lateral interbody fusions L2-5 with supplemental posterior pedicle screw fixation and possible open decompression especially at the L2-3 level. The patient is already had a prior operation at L4-5.   I have gone over the surgery with the patient and his wife and I have explained to them how this is a very extensive operation 1 in which I have not performed in someone of his age. I did tell him that there is a 30-35% chance that he will be either unimproved or worse than his current status. There is also the potential risks of neurological deficits as well as possible death or stroke. The first day of the surgery would be an oblique lumbar interbody fusion at L4-5 followed by  lateral interbody fusions L2-3 and L3-4. The next day provided that the neuropathic pain has improved we would consider just a straightforward supplemental posterior pedicle screw fixation. If he still had  significant neuropathic leg pain that we may need to consider additional open decompression at that time.   OLIF/XLIF risks, benefits of surgery were reviewed with the patient. These include: infection, bleeding, death, stroke, paralysis, ongoing or worse pain, need for additional surgery, injury to the lumbar plexus resulting in hip flexor weakness and difficulty walking without assistive devices. Adjacent segment degenerative disease, need for additional surgery including fusing other levels, leak of spinal fluid, Nonunion, hardware failure, breakage, or mal-position. Deep venous thrombosis (DVT) requiring additional treatment such as filter, and/or medications. Injury to abdominal contents, loss in bowel and bladder control.   Risks and benefits of spinal fusion: Infection, bleeding, death, stroke, paralysis, ongoing or worse pain, need for additional surgery, nonunion, leak of spinal fluid, adjacent segment degeneration requiring additional fusion surgery, Injury to abdominal vessels that can require anterior surgery to stop bleeding. Malposition of the cage and/or pedicle screws that could require additional surgery. Loss of bowel and bladder control. Postoperative hematoma causing neurologic compression that could require urgent or emergent re-operation.   Plan:   Day 1 will be an L4-5 oblique lumbar interbody fusion and a 2 level L2-3, L3-4 extreme lateral interbody fusion. Day 2 will be possible decompression with posterior spinal fusion instrumentation from L2-L5  We have obtained preoperative medical clearance for the patient's primary care provider. PCP has also ordered an echo and a chest x-ray to be done  I reviewed the patient's medication list with him. He is not on any blood thinners. Not using any aspirin. Not using NSAIDs. He is in pain management with Dr. Nelva Bush on morphine 30mg  TID. We will plan to have Dr. Nelva Bush manage the patient's pain postoperatively  We have also discussed  the post-operative recovery period to include: bathing/showering restrictions, wound healing, activity (and driving) restrictions, medications/pain mangement.  We have also discussed post-operative redflags to include: signs and symptoms of postoperative infection, DVT/PE.  Discharge instructions were reviewed with the patient and he was given a copy.  Patient is scheduled to have an LSO brace fitting and preop testing at the hospital  All questions were invited and answered.  Follow-up: 2 weeks postop

## 2021-01-07 NOTE — Progress Notes (Signed)
Patient name: Vernon Bishop MRN: [id number] DOB: [birth year] Sex: male  REASON FOR CONSULT: Evaluate for L4-L5 OLIF  HPI: Vernon Bishop is a 80 y.o. male, with history of hypertension hyperlipidemia and chronic lower back pain who presents for evaluation of L4-L5 OLIF.  Patient states he has had lower back pain for years.  He has been evaluated Dr. Rolena Infante after failing conservative management who has recommended L4-L5 OLIF and vascular surgery was asked to assist with abdominal exposure.  Previous abdominal surgery includes open cholecystectomy and open appendectomy.  He is a retired Airline pilot and worked with Engineer, materials for [employer]  Past Medical History:  Diagnosis Date   Arthralgia    Atrial fibrillation ([place])    1992   Benign prostatic hyperplasia    DDD (degenerative disc disease)    Depression    Diabetes mellitus    type II  - PT STATES NOT ON ANY DIABETIC MEDICINES   Dysrhythmia    aFIB   Dyssomnia    ED (erectile dysfunction)    Hyperlipidemia    Hypertension    Hypogonadism male    Joint pain    LBP (low back pain)    Multiple actinic keratoses    Muscle weakness    Upper limb   MVP (mitral valve prolapse)    Neck pain    OSA on CPAP    cpap   Osteoarthritis of hip    Osteoarthritis, hand    SEVERE LOWER BACK PAIN, AND RESTLESS LEG SYNDROME   Otalgia of right ear    Pain in left knee    Pain in thoracic spine    Palpitations    PTSD (post-traumatic stress disorder)    Seizure (HCC)    Shoulder pain    Subjective visual disturbance    Swallowing problem    HX OF PILLS GETTING "STUCK" IN THROAT AT TIMES   Swelling    Type 2 diabetes mellitus ([place])    Vitamin D deficiency     Past Surgical History:  Procedure Laterality Date   APPENDECTOMY     BACK SURGERY  1987   CATARACT EXTRACTION     CERVICAL LAMINECTOMY  2004   [name], rod was placed- HAS ROM LIMITATIONS   CHOLECYSTECTOMY     COLONOSCOPY     KNEE SURGERY     left  knee/arthroscopic   TOTAL HIP ARTHROPLASTY Right 03/14/2013   Procedure: RIGHT TOTAL HIP ARTHROPLASTY ANTERIOR APPROACH;  Surgeon: [name], MD;  [facility];  Service: Orthopedics;  Laterality: Right;   TOTAL HIP ARTHROPLASTY Left 05/23/2019   Procedure: TOTAL HIP ARTHROPLASTY ANTERIOR APPROACH;  Surgeon: Paralee Cancel, MD;  Location[facility];  Service: Orthopedics;  Laterality: Left;  70 mins    Family History  Problem Relation Age of Onset   Allergies Mother    Stroke Mother        Clotting disorders   Heart disease Mother    Heart disease Father    Heart attack Father 58   Heart disease Brother    Rheum arthritis Paternal Grandmother    Other Paternal Grandmother        Rheumatism   Coronary artery disease Other    Diabetes Other    Hypertension Other    Esophageal cancer Maternal Aunt    Colon cancer Neg Hx    Rectal cancer Neg Hx    Stomach cancer Neg Hx     SOCIAL HISTORY: Social History   Socioeconomic  History   Marital status: Married    Spouse name: Not on file   Number of children: 4   Years of education: Not on file   Highest education level: Not on file  Occupational History   Occupation: Retired-    Fish farm manager: RETIRED    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  Tobacco Use   Smoking status: Former    Packs/day: 0.50    Years: 12.00    Pack years: 6.00    Types: Cigarettes    Quit date: 03/31/1971    Years since quitting: 49.8   Smokeless tobacco: Never   Tobacco comments:    Started at age 54  Vaping Use   Vaping Use: Never used  Substance and Sexual Activity   Alcohol use: Yes    Alcohol/week: 2.0 standard drinks    Types: 2 Cans of beer per week    Comment: occasionally   Drug use: No   Sexual activity: Yes  Other Topics Concern   Not on file  Social History Narrative   FAMILY HISTORY   History of CAD Male 1st degree relative <50   History Diabetes 1st degree relative   History hypertension               Social Determinants of Health    Financial Resource Strain: Low Risk    Difficulty of Paying Living Expenses: Not hard at all  Food Insecurity: No Food Insecurity   Worried About Charity fundraiser in the Last Year: Never true   Ran Out of Food in the Last Year: Never true  Transportation Needs: No Transportation Needs   Lack of Transportation (Medical): No   Lack of Transportation (Non-Medical): No  Physical Activity: Inactive   Days of Exercise per Week: 0 days   Minutes of Exercise per Session: 0 min  Stress: No Stress Concern Present   Feeling of Stress : Not at all  Social Connections: Socially Integrated   Frequency of Communication with Friends and Family: More than three times a week   Frequency of Social Gatherings with Friends and Family: More than three times a week   Attends Religious Services: 1 to 4 times per year   Active Member of [employer] or Organizations: Yes   Attends Archivist Meetings: 1 to 4 times per year   Marital Status: Married  Human resources officer Violence: Not At Risk   Fear of Current or Ex-Partner: No   Emotionally Abused: No   Physically Abused: No   Sexually Abused: No    Allergies  Allergen Reactions   Abilify [Aripiprazole]     SOB feeling   Cymbalta [Duloxetine Hcl]     constipation   Digoxin And Related     HR 41   Duloxetine     REACTION: constipation   Prednisolone     Edema, wt gain   Temazepam     Current Outpatient Medications  Medication Sig Dispense Refill   acetaminophen (TYLENOL) 500 MG tablet Take 1,000 mg by mouth daily.     amLODipine (NORVASC) 5 MG tablet Take 1 tablet (5 mg total) by mouth daily. 90 tablet 3   Cholecalciferol (VITAMIN D3) 1000 UNIT tablet Take 1,000 Units by mouth daily.       ciclopirox (PENLAC) 8 % solution APPLY OVER NAIL AND SURROUNDING SKIN. APPLY DAILY OVER PREVIOUS COAT. AFTER SEVEN (7) DAYS, MAY REMOVE WITH ALCOHOL AND AND CONTINUE CYCLE. 6.6 mL 1   clotrimazole-betamethasone (LOTRISONE) cream APPLY TO AFFECTED AREA 2  TIMES A DAY. 45 g 1   Cyanocobalamin (VITAMIN B-12 CR) 1000 MCG TBCR Take 1,000 mcg by mouth daily.       docusate sodium (COLACE) 100 MG capsule Take 1 capsule (100 mg total) by mouth 2 (two) times daily. 28 capsule 0   dutasteride (AVODART) 0.5 MG capsule Take 1 capsule (0.5 mg total) by mouth daily. 30 capsule 11   FLUoxetine (PROZAC) 40 MG capsule TAKE 1 CAPSULE BY MOUTH DAILY. 90 capsule 2   gabapentin (NEURONTIN) 300 MG capsule TAKE 1 CAPSULE BY MOUTH THREE TIMES A DAY 90 capsule 11   ketoconazole (NIZORAL) 2 % shampoo APPLY AS DIRECTED 2 TIMES A WEEK 120 mL 0   morphine (MS CONTIN) 30 MG 12 hr tablet Take 1 tablet (30 mg total) by mouth every 12 (twelve) hours. (Patient taking differently: Take 30 mg by mouth every 8 (eight) hours.) 60 tablet 0   Polyethyl Glycol-Propyl Glycol (LUBRICANT EYE DROPS) 0.4-0.3 % SOLN Place 1 drop into both eyes 3 (three) times daily as needed (dry/irritated eyes.).     polyethylene glycol (MIRALAX / GLYCOLAX) 17 g packet Take 17 g by mouth 2 (two) times daily. (Patient taking differently: Take 17 g by mouth every other day.) 28 packet 0   propranolol (INDERAL) 80 MG tablet TAKE 1 TABLET BY MOUTH 3 TIMES DAILY. 270 tablet 3   testosterone cypionate (DEPOTESTOSTERONE CYPIONATE) 200 MG/ML injection INJECT 3 MILLILITERS INTO THE MUSCLE EVERY 14 DAYS 10 mL 5   tiZANidine (ZANAFLEX) 4 MG tablet TAKE 1 TABLET BY MOUTH EVERY 8 HOURS AS NEEDED FOR MUSCLE SPASMS. 60 tablet 1   traZODone (DESYREL) 50 MG tablet TAKE 1 TO 2 TABLETS BY MOUTH AT BEDTIME. 60 tablet 5   No current facility-administered medications for this visit.    REVIEW OF SYSTEMS:  [X]  denotes positive finding, [ ]  denotes negative finding Cardiac  Comments:  Chest pain or chest pressure:    Shortness of breath upon exertion:    Short of breath when lying flat:    Irregular heart rhythm:        Vascular    Pain in calf, thigh, or hip brought on by ambulation:    Pain in feet at night that wakes you  up from your sleep:     Blood clot in your veins:    Leg swelling:         Pulmonary    Oxygen at home:    Productive cough:     Wheezing:         Neurologic    Sudden weakness in arms or legs:     Sudden numbness in arms or legs:     Sudden onset of difficulty speaking or slurred speech:    Temporary loss of vision in one eye:     Problems with dizziness:         Gastrointestinal    Blood in stool:     Vomited blood:         Genitourinary    Burning when urinating:     Blood in urine:        Psychiatric    Major depression:         Hematologic    Bleeding problems:    Problems with blood clotting too easily:        Skin    Rashes or ulcers:        Constitutional    Fever or chills:      PHYSICAL  EXAM: Vitals:   01/07/21 0953 01/07/21 0959  BP: (!) 193/77 (!) 182/74  Pulse: (!) 47 (!) 47  Resp: 16   Temp: 97.9 F (36.6 C)   TempSrc: Temporal   SpO2: (!) 47%   Weight: 210 lb (95.3 kg)   Height: 6' (1.829 m)     GENERAL: The patient is a well-nourished male, in no acute distress. The vital signs are documented above. CARDIAC: There is a regular rate and rhythm.  VASCULAR:  Palpable femoral pulses bilaterally Palpable PT pulses bilaterally PULMONARY: No respiratory distress. ABDOMEN: Soft and non-tender.  Previous open cholecystectomy incision right upper quadrant. MUSCULOSKELETAL: There are no major deformities or cyanosis. NEUROLOGIC: No focal weakness or paresthesias are detected. SKIN: There are no ulcers or rashes noted. PSYCHIATRIC: The patient has a normal affect.  DATA:   MRI lumbar spine reviewed from 10/24/2020 and aortic bifurcation at L3-L4 with vein bifurcation at L4-L5 disc space  Assessment/Plan:  80 year old male with chronic lower back pain that presents for preop evaluation of planned L4-L5 OLIF with Dr. Rolena Infante.  I have reviewed his MRI and I think he would be a good candidate for oblique approach.  I discussed him being in the right  lateral position with a oblique incision over the left oblique muscles and then mobilizing the peritoneum and left ureter as well as the left psoas to expose the disc space at L4-L5.  We talked about possibly needing to mobilize the iliac vessels as well.  Discussed risk of injury to the above structures.  Look forward to assisting Dr. Rolena Infante.  All questions answered.  Marty Heck, MD [facility] Office: [phone]

## 2021-01-08 ENCOUNTER — Telehealth: Payer: Self-pay | Admitting: Internal Medicine

## 2021-01-08 NOTE — Telephone Encounter (Signed)
Patient requesting order for echo cardiogram for upcoming surgery on 01-22-2021  Please advise

## 2021-01-08 NOTE — Telephone Encounter (Signed)
Patient requesting order for chest xray for upcoming surgery  Please advise

## 2021-01-09 NOTE — Telephone Encounter (Signed)
It was ordered on 10/10.  Thanks

## 2021-01-09 NOTE — Telephone Encounter (Signed)
Notified pt w/ MD response.wife states no one has called her abt the echocardiogram. Verified in chart MD did order but incorrectly. Will re-enter..Inform pt wife someone will give them a call soon to schedule.....[name]

## 2021-01-10 ENCOUNTER — Ambulatory Visit (INDEPENDENT_AMBULATORY_CARE_PROVIDER_SITE_OTHER)
Admission: RE | Admit: 2021-01-10 | Discharge: 2021-01-10 | Disposition: A | Discharge status: Home / Self Care | Payer: PPO | Source: Ambulatory Visit | Attending: Internal Medicine | Admitting: Internal Medicine

## 2021-01-10 ENCOUNTER — Other Ambulatory Visit: Payer: Self-pay

## 2021-01-10 DIAGNOSIS — R002 Palpitations: Secondary | ICD-10-CM

## 2021-01-10 DIAGNOSIS — I517 Cardiomegaly: Secondary | ICD-10-CM | POA: Diagnosis not present

## 2021-01-10 DIAGNOSIS — M5416 Radiculopathy, lumbar region: Secondary | ICD-10-CM | POA: Diagnosis not present

## 2021-01-10 DIAGNOSIS — Z01818 Encounter for other preprocedural examination: Secondary | ICD-10-CM

## 2021-01-13 NOTE — Progress Notes (Signed)
Surgical Instructions   Your procedure is scheduled on Wednesday 01/22/2021.  Report to [facility] Main Entrance "A" at 05:30 A.M., then check in with the Admitting office.  Call [phone] if you have problems or questions between now and the morning of surgery:   Remember: Do not eat after midnight the night before your surgery  You may drink clear liquids until 04:30am the morning of your surgery.   Clear liquids allowed are: Water, Non-Citrus Juices (without pulp), Carbonated Beverages, Clear Tea, Black Coffee Only (NO MILK, CREAM, or POWDERED CREAMER of any kind), and Gatorade   Take these medicines the morning of surgery with A SIP OF WATER:  Amlodipine (Norvasc) Fluoxetine (Prozac) Gabapentin (Neurontin) Morphine (MS Contin) Propanolol (Inderol)   If needed you may take these medications the morning of surgery: Acetaminophen (Tylenol) Polyethyl Glycol-Propyl Glycol (lubricant eye drops) Sodium Chloride (Ocean) nasal spray Tizanidine (Zanaflex)  As of today, STOP taking any Aspirin (unless otherwise instructed by your surgeon) or Aspirin-containing products; NSAIDS - Aleve, Naproxen, Ibuprofen, Motrin, Advil, Goody's, BC's, all herbal medications (including Saw Palmetto), fish oil, and all vitamins.   After your COVID test   You are not required to quarantine however you are required to wear a well-fitting mask when you are out and around people not in your household.  If your mask becomes wet or soiled, replace with a new one.  Wash your hands often with soap and water for 20 seconds or clean your hands with an alcohol-based hand sanitizer that contains at least 60% alcohol.  Do not share personal items.  Notify your provider: if you are in close contact with someone who has COVID  or if you develop a fever of 100.4 or greater, sneezing, cough, sore throat, shortness of breath or body aches.          Do not wear jewelry  Do not wear lotions, powders, colognes,  or deodorant.  Men may shave face and neck.  Do not bring valuables to the hospital - [facility] is not responsible for any belongings or valuables.  Do NOT Smoke (Tobacco/Vaping) or drink Alcohol 24 hours prior to your procedure  If you use a CPAP at night, you may bring all equipment for your overnight stay.   Contacts, glasses, hearing aids, dentures or partials may not be worn into surgery, please bring cases for these belongings   For patients admitted to the hospital, discharge time will be determined by your treatment team.   Patients discharged the day of surgery will not be allowed to drive home, and someone needs to stay with them for 24 hours.  NO VISITORS WILL BE ALLOWED IN PRE-OP WHERE PATIENTS ARE PREPPED FOR SURGERY.  ONLY 1 SUPPORT PERSON MAY BE PRESENT IN THE WAITING ROOM WHILE YOU ARE IN SURGERY.  IF YOU ARE TO BE ADMITTED, ONCE YOU ARE IN YOUR ROOM YOU WILL BE ALLOWED TWO (2) VISITORS. 1 (ONE) VISITOR MAY STAY OVERNIGHT BUT MUST ARRIVE TO THE ROOM BY 8pm.  Minor children may have two parents present. Special consideration for safety and communication needs will be reviewed on a case by case basis.  Special instructions:    Oral Hygiene is also important to reduce your risk of infection.  Remember - BRUSH YOUR TEETH THE MORNING OF SURGERY WITH YOUR REGULAR TOOTHPASTE   [place]- Preparing For Surgery  Before surgery, you can play an important role. Because skin is not sterile, your skin needs to be as free of germs  as possible. You can reduce the number of germs on your skin by washing with CHG (chlorahexidine gluconate) Soap before surgery.  CHG is an antiseptic cleaner which kills germs and bonds with the skin to continue killing germs even after washing.     Please do not use if you have an allergy to CHG or antibacterial soaps. If your skin becomes reddened/irritated stop using the CHG.  Do not shave (including legs and underarms) for at least 48 hours prior to  first CHG shower. It is OK to shave your face.  Please follow these instructions carefully.     Shower the NIGHT BEFORE SURGERY and the MORNING OF SURGERY with CHG Soap.   If you chose to wash your hair, wash your hair first as usual with your normal shampoo. After you shampoo, rinse your hair and body thoroughly to remove the shampoo.    Then [employer] and genitals (private parts) with your normal soap and rinse thoroughly to remove soap.  Next use the CHG Soap as you would any other liquid soap. You can apply CHG directly to the skin and wash gently with a clean washcloth.   Apply the CHG Soap to your body ONLY FROM THE NECK DOWN.  Do not use on open wounds or open sores. Avoid contact with your eyes, ears, mouth and genitals (private parts). Wash Face and genitals (private parts)  with your normal soap.   Wash thoroughly, paying special attention to the area where your surgery will be performed.  Thoroughly rinse your body with warm water from the neck down.  DO NOT shower/wash with your normal soap after using and rinsing off the CHG Soap.  Pat yourself dry with a CLEAN TOWEL.  Wear CLEAN PAJAMAS to bed the night before surgery  Place CLEAN SHEETS on your bed the night before your surgery  DO NOT SLEEP WITH PETS.   Day of Surgery:  Take a shower with CHG soap. Wear Clean/Comfortable clothing the morning of surgery Do not apply any deodorants/lotions.   Remember to brush your teeth WITH YOUR REGULAR TOOTHPASTE.   Please read over the following fact sheets that you were given.

## 2021-01-14 ENCOUNTER — Encounter (HOSPITAL_COMMUNITY): Payer: Self-pay

## 2021-01-14 ENCOUNTER — Other Ambulatory Visit: Payer: Self-pay

## 2021-01-14 ENCOUNTER — Encounter (HOSPITAL_COMMUNITY)
Admission: RE | Admit: 2021-01-14 | Discharge: 2021-01-14 | Disposition: A | Discharge status: Home / Self Care | Payer: PPO | Source: Ambulatory Visit | Attending: Orthopedic Surgery | Admitting: Orthopedic Surgery

## 2021-01-14 DIAGNOSIS — F4310 Post-traumatic stress disorder, unspecified: Secondary | ICD-10-CM | POA: Diagnosis not present

## 2021-01-14 DIAGNOSIS — E119 Type 2 diabetes mellitus without complications: Secondary | ICD-10-CM | POA: Insufficient documentation

## 2021-01-14 DIAGNOSIS — M5136 Other intervertebral disc degeneration, lumbar region: Secondary | ICD-10-CM | POA: Diagnosis not present

## 2021-01-14 DIAGNOSIS — Z96643 Presence of artificial hip joint, bilateral: Secondary | ICD-10-CM | POA: Insufficient documentation

## 2021-01-14 DIAGNOSIS — Z79899 Other long term (current) drug therapy: Secondary | ICD-10-CM | POA: Insufficient documentation

## 2021-01-14 DIAGNOSIS — G4733 Obstructive sleep apnea (adult) (pediatric): Secondary | ICD-10-CM | POA: Diagnosis not present

## 2021-01-14 DIAGNOSIS — R609 Edema, unspecified: Secondary | ICD-10-CM | POA: Insufficient documentation

## 2021-01-14 DIAGNOSIS — Z01812 Encounter for preprocedural laboratory examination: Secondary | ICD-10-CM | POA: Diagnosis not present

## 2021-01-14 DIAGNOSIS — Z9989 Dependence on other enabling machines and devices: Secondary | ICD-10-CM | POA: Diagnosis not present

## 2021-01-14 DIAGNOSIS — Z87891 Personal history of nicotine dependence: Secondary | ICD-10-CM | POA: Diagnosis not present

## 2021-01-14 DIAGNOSIS — M48061 Spinal stenosis, lumbar region without neurogenic claudication: Secondary | ICD-10-CM | POA: Insufficient documentation

## 2021-01-14 DIAGNOSIS — R001 Bradycardia, unspecified: Secondary | ICD-10-CM | POA: Insufficient documentation

## 2021-01-14 DIAGNOSIS — M4156 Other secondary scoliosis, lumbar region: Secondary | ICD-10-CM | POA: Diagnosis not present

## 2021-01-14 DIAGNOSIS — I080 Rheumatic disorders of both mitral and aortic valves: Secondary | ICD-10-CM | POA: Diagnosis not present

## 2021-01-14 DIAGNOSIS — I10 Essential (primary) hypertension: Secondary | ICD-10-CM | POA: Diagnosis not present

## 2021-01-14 DIAGNOSIS — E785 Hyperlipidemia, unspecified: Secondary | ICD-10-CM | POA: Insufficient documentation

## 2021-01-14 DIAGNOSIS — M79606 Pain in leg, unspecified: Secondary | ICD-10-CM | POA: Diagnosis not present

## 2021-01-14 DIAGNOSIS — R002 Palpitations: Secondary | ICD-10-CM | POA: Diagnosis not present

## 2021-01-14 HISTORY — DX: Bradycardia, unspecified: R00.1

## 2021-01-14 LAB — GLUCOSE, CAPILLARY: Glucose-Capillary: 93 mg/dL (ref 70–99)

## 2021-01-14 LAB — BASIC METABOLIC PANEL
Anion gap: 9 (ref 5–15)
BUN: 18 mg/dL (ref 8–23)
CO2: 28 mmol/L (ref 22–32)
Calcium: 9.2 mg/dL (ref 8.9–10.3)
Chloride: 101 mmol/L (ref 98–111)
Creatinine, Ser: 1.01 mg/dL (ref 0.61–1.24)
GFR, Estimated: >60 mL/min (ref >60)
Glucose, Bld: 79 mg/dL (ref 70–99)
Potassium: 3.9 mmol/L (ref 3.5–5.1)
Sodium: 138 mmol/L (ref 135–145)

## 2021-01-14 LAB — URINALYSIS, ROUTINE W REFLEX MICROSCOPIC
Bacteria, UA: NONE SEEN
Bilirubin Urine: NEGATIVE
Glucose, UA: NEGATIVE mg/dL
Ketones, ur: NEGATIVE mg/dL
Leukocytes,Ua: NEGATIVE
Nitrite: NEGATIVE
Protein, ur: NEGATIVE mg/dL
Specific Gravity, Urine: 1.008 (ref 1.005–1.030)
pH: 5 (ref 5.0–8.0)

## 2021-01-14 LAB — CBC
HCT: 46.9 % (ref 39.0–52.0)
Hemoglobin: 15.4 g/dL (ref 13.0–17.0)
MCH: 30 pg (ref 26.0–34.0)
MCHC: 32.8 g/dL (ref 30.0–36.0)
MCV: 91.4 fL (ref 80.0–100.0)
Platelets: 174 10*3/uL (ref 150–400)
RBC: 5.13 MIL/uL (ref 4.22–5.81)
RDW: 13.5 % (ref 11.5–15.5)
WBC: 5.8 10*3/uL (ref 4.0–10.5)
nRBC: 0 % (ref 0.0–0.2)

## 2021-01-14 LAB — HEMOGLOBIN A1C
Hgb A1c MFr Bld: 4.9 % (ref 4.8–5.6)
Mean Plasma Glucose: 93.93 mg/dL

## 2021-01-14 LAB — PROTIME-INR
INR: 1 (ref 0.8–1.2)
Prothrombin Time: 13.5 seconds (ref 11.4–15.2)

## 2021-01-14 LAB — TYPE AND SCREEN
ABO/RH(D): O NEG
Antibody Screen: NEGATIVE

## 2021-01-14 LAB — APTT: aPTT: 34 seconds (ref 24–36)

## 2021-01-14 LAB — SURGICAL PCR SCREEN
MRSA, PCR: NEGATIVE
Staphylococcus aureus: POSITIVE — AB

## 2021-01-14 NOTE — Progress Notes (Signed)
PCP - Lew Dawes, MD Cardiologist - pt denies  PPM/ICD - n/a  Chest x-ray - 01/10/21 EKG - done 01/06/21 at PCP-tracing requested Stress Test - "over 20 years ago" ECHO - reports that he is having one done tomorrow, 01/15/21 Cardiac Cath - "over 20 years ago"  Sleep Study - 07/18/00 CPAP - yes  Fasting Blood Sugar - does not check, pt reports that he does not have diabetes. CBG 93 in PAT   Blood Thinner Instructions: n/a Aspirin Instructions: As of today, STOP taking any Aspirin (unless otherwise instructed by your surgeon) or Aspirin-containing products; NSAIDS - Aleve, Naproxen, Ibuprofen, Motrin, Advil, Goody's, BC's, all herbal medications (including Saw Palmetto), fish oil, and all vitamins.  ERAS Protcol - yes, clears until 0430 PRE-SURGERY Ensure or G2- n/a  COVID TEST- scheduled for 01/20/21 at 11am  Anesthesia review: yes; cardiac hx  Patient denies shortness of breath, fever, cough and chest pain at PAT appointment   All instructions explained to the patient, with a verbal understanding of the material. Patient agrees to go over the instructions while at home for a better understanding. Patient also instructed to self quarantine after being tested for COVID-19. The opportunity to ask questions was provided.

## 2021-01-15 ENCOUNTER — Encounter (HOSPITAL_COMMUNITY): Payer: Self-pay | Admitting: Vascular Surgery

## 2021-01-15 ENCOUNTER — Ambulatory Visit (HOSPITAL_COMMUNITY): Payer: PPO | Attending: Cardiology

## 2021-01-15 DIAGNOSIS — Z01818 Encounter for other preprocedural examination: Secondary | ICD-10-CM | POA: Diagnosis not present

## 2021-01-15 DIAGNOSIS — G4730 Sleep apnea, unspecified: Secondary | ICD-10-CM | POA: Insufficient documentation

## 2021-01-15 DIAGNOSIS — R609 Edema, unspecified: Secondary | ICD-10-CM | POA: Insufficient documentation

## 2021-01-15 DIAGNOSIS — Z01810 Encounter for preprocedural cardiovascular examination: Secondary | ICD-10-CM | POA: Diagnosis not present

## 2021-01-15 DIAGNOSIS — Z87891 Personal history of nicotine dependence: Secondary | ICD-10-CM | POA: Diagnosis not present

## 2021-01-15 DIAGNOSIS — E119 Type 2 diabetes mellitus without complications: Secondary | ICD-10-CM | POA: Diagnosis not present

## 2021-01-15 DIAGNOSIS — I10 Essential (primary) hypertension: Secondary | ICD-10-CM | POA: Diagnosis not present

## 2021-01-15 DIAGNOSIS — R001 Bradycardia, unspecified: Secondary | ICD-10-CM | POA: Diagnosis not present

## 2021-01-15 DIAGNOSIS — I4891 Unspecified atrial fibrillation: Secondary | ICD-10-CM | POA: Insufficient documentation

## 2021-01-15 DIAGNOSIS — R002 Palpitations: Secondary | ICD-10-CM

## 2021-01-15 LAB — ECHOCARDIOGRAM COMPLETE
Area-P 1/2: 2.62 cm2
S' Lateral: 3.4 cm

## 2021-01-15 MED ORDER — FUROSEMIDE 20 MG PO TABS: 20.0000 mg | ORAL_TABLET | Freq: Every day | ORAL | 3 refills | Status: DC

## 2021-01-15 NOTE — Progress Notes (Addendum)
Anesthesia Chart Review:  Case: [id number] Date/Time: 01/22/21 0715   Procedures:      OBLIQUE LUMBAR INTERBODY FUSION 1 LEVEL (OLIF L4-5, XLIF L2-4) - Left Tap Block with exparel     ANTERIOR LATERAL LUMBAR FUSION 2 LEVELS     ABDOMINAL EXPOSURE   Anesthesia type: General   Pre-op diagnosis: Degenerative scoliosis with degenerative slip L4-5, spinal stenosis with neuropathic leg pain   Location: [facility] OR ROOM 04 / [place] [facility]   Surgeons: Melina Schools, MD; Marty Heck, MD       DISCUSSION: Patient is an 80 year old male scheduled for the above procedure. HE is then scheduled for L2-5 posterior lumber fusion, possible decompression by Dr. Rolena Infante on 01/23/21.   History includes former smoker (quit 03/31/71), HTN, HLD, afib (1992), MVP (2005 echo; normal MV structure with trivial MR 01/15/21 echo), palpitations, bradycardia, PTSD, DM2, OSA (uses CPAP), edema, spinal surgery, THA (right 03/14/13, left 05/23/19).  Per 05/05/2012 cardiology note by Lauree Chandler, MD, "He notes having an episode of atrial fibrillation in 1992 and was started on Digoxin. No recurrence since then. Echo in 2005 with MVP, mild MR. Cardiac cath 2005 per Dr. Johnsie Cancel with no evidence of CAD." Following 01/06/21 preoperative evaluation visit, PCP [name], [name], MD ordered an echo which was done on 01/15/21 and showed normal LVEF, grade 1 diastolic dysfunction, moderately elevated pulmonary artery systolic pressure, ~ RVSP 47.2 mmHg, trivial MR. Dr. Alain Marion is referring Vernon Bishop for a preoperative cardiology evaluation.  Preoperative COVID-19 testing is scheduled for 01/20/2021.  Will leave chart for follow-up cardiology evaluation.    VS: BP (!) 152/79   Pulse (!) 46   Temp 36.8 C (Oral)   Resp 18   Ht 6' (1.829 m)   Wt 95.8 kg   SpO2 96%   BMI 28.64 kg/m    PROVIDERS: [name], [name], MD is PCP  Kara Mead, MD is pulmonologist. Last visit 10/08/20 for OSA.  - Currently last cardiology visit  is from 05/05/12 with Lauree Chandler, MD. He has pending new patient cardiology evaluation/preoperative assessement for finding of moderately elevated PASP on recent echo.   LABS: Labs reviewed: Acceptable for surgery. (all labs ordered are listed, but only abnormal results are displayed)  Labs Reviewed  SURGICAL PCR SCREEN - Abnormal; Notable for the following components:      Result Value   Staphylococcus aureus POSITIVE (*)    All other components within normal limits  URINALYSIS, ROUTINE W REFLEX MICROSCOPIC - Abnormal; Notable for the following components:   Hgb urine dipstick MODERATE (*)    All other components within normal limits  HEMOGLOBIN A1C  CBC  BASIC METABOLIC PANEL  PROTIME-INR  APTT  GLUCOSE, CAPILLARY  TYPE AND SCREEN     IMAGES: CXR 01/10/21: FINDINGS: Heart is mildly enlarged. Slight increased diffuse interstitial opacities at the bases suggest mild edema. No significant effusions are present. Calcified granuloma again noted in the left upper lobe. Lungs are otherwise clear. Calcified mediastinal lymph nodes are again seen. IMPRESSION: Mild cardiomegaly and mild bibasilar interstitial edema.   EKG: 01/06/21: Sinus bradycardia at 45 bpm.  Nonspecific ST abnormality.   CV: Echo 01/15/21: IMPRESSIONS   1. Left ventricular ejection fraction, by estimation, is 55 to 60%. The  left ventricle has normal function. The left ventricle has no regional  wall motion abnormalities. Left ventricular diastolic parameters are  consistent with Grade I diastolic  dysfunction (impaired relaxation).   2. Right ventricular systolic function is normal. The  right ventricular  size is normal. There is moderately elevated pulmonary artery systolic  pressure. The estimated right ventricular systolic pressure is 70.6 mmHg.   3. Left atrial size was moderately dilated.   4. The mitral valve is normal in structure. Trivial mitral valve  regurgitation. No evidence of  mitral stenosis.   5. The aortic valve is normal in structure. Aortic valve regurgitation is  trivial. No aortic stenosis is present.   6. Aortic dilatation noted. There is mild dilatation of the ascending  aorta, measuring 40 mm.   7. The inferior vena cava is normal in size with greater than 50%  respiratory variability, suggesting right atrial pressure of 3 mmHg.  - Comparison(s): Changes from prior study are noted. 05/09/12 EF 55-60%. PA  pressure 76mmHg.    Past Medical History:  Diagnosis Date   Arthralgia    Atrial fibrillation ([place])    1992   Benign prostatic hyperplasia    Bradycardia    "my doctor says I have bradycardia, my heart rate is always in the 40s"   DDD (degenerative disc disease)    Depression    Diabetes mellitus    type II  - PT STATES NOT ON ANY DIABETIC MEDICINES   Dysrhythmia    aFIB   Dyssomnia    ED (erectile dysfunction)    Hyperlipidemia    Hypertension    Hypogonadism male    Joint pain    LBP (low back pain)    Multiple actinic keratoses    Muscle weakness    Upper limb   MVP (mitral valve prolapse)    Neck pain    OSA on CPAP    cpap   Osteoarthritis of hip    Osteoarthritis, hand    SEVERE LOWER BACK PAIN, AND RESTLESS LEG SYNDROME   Otalgia of right ear    Pain in left knee    Pain in thoracic spine    Palpitations    PTSD (post-traumatic stress disorder)    Seizure ([place])    Pt denies ever having seizures   Shoulder pain    Subjective visual disturbance    Swallowing problem    HX OF PILLS GETTING "STUCK" IN THROAT AT TIMES   Swelling    Type 2 diabetes mellitus ([place])    Vitamin D deficiency     Past Surgical History:  Procedure Laterality Date   APPENDECTOMY     BACK SURGERY  1987   CATARACT EXTRACTION     CERVICAL LAMINECTOMY  2004   [name], rod was placed- HAS ROM LIMITATIONS   CHOLECYSTECTOMY     COLONOSCOPY     KNEE SURGERY     left knee/arthroscopic   TOTAL HIP ARTHROPLASTY Right 03/14/2013   Procedure:  RIGHT TOTAL HIP ARTHROPLASTY ANTERIOR APPROACH;  Surgeon: [name], MD;  Location: [facility] ORS;  Service: Orthopedics;  Laterality: Right;   TOTAL HIP ARTHROPLASTY Left 05/23/2019   Procedure: TOTAL HIP ARTHROPLASTY ANTERIOR APPROACH;  Surgeon: Paralee Cancel, MD;  Location: [facility] ORS;  Service: Orthopedics;  Laterality: Left;  70 mins    MEDICATIONS:  acetaminophen (TYLENOL) 500 MG tablet   amLODipine (NORVASC) 5 MG tablet   Cholecalciferol (VITAMIN D3) 1000 UNIT tablet   ciclopirox (PENLAC) 8 % solution   clotrimazole-betamethasone (LOTRISONE) cream   Cyanocobalamin (VITAMIN B-12 CR) 1000 MCG TBCR   FLUoxetine (PROZAC) 40 MG capsule   furosemide (LASIX) 20 MG tablet   gabapentin (NEURONTIN) 300 MG capsule   morphine (MS CONTIN) 30  MG 12 hr tablet   Polyethyl Glycol-Propyl Glycol (LUBRICANT EYE DROPS) 0.4-0.3 % SOLN   polyethylene glycol (MIRALAX / GLYCOLAX) 17 g packet   propranolol (INDERAL) 80 MG tablet   Saw Palmetto 450 MG CAPS   sodium chloride (OCEAN) 0.65 % SOLN nasal spray   testosterone cypionate (DEPOTESTOSTERONE CYPIONATE) 200 MG/ML injection   tiZANidine (ZANAFLEX) 4 MG tablet   traZODone (DESYREL) 50 MG tablet   Zinc 30 MG TABS   No current facility-administered medications for this encounter.    Myra Gianotti, PA-C Surgical Short Stay/Anesthesiology [facility] Phone [phone] [facility] Phone [phone] 01/16/2021 10:13 AM

## 2021-01-16 ENCOUNTER — Other Ambulatory Visit: Payer: Self-pay | Admitting: Internal Medicine

## 2021-01-16 ENCOUNTER — Telehealth: Payer: Self-pay

## 2021-01-16 DIAGNOSIS — I272 Pulmonary hypertension, unspecified: Secondary | ICD-10-CM

## 2021-01-16 NOTE — Telephone Encounter (Signed)
[facility] attempted to contact the patient to schedule. Patient was questioning the reasoning for the referral so they told the patient the referral coordinator would follow up with the patient.   Patient is requesting a follow up call to discuss the referral further.

## 2021-01-16 NOTE — Telephone Encounter (Signed)
Patient wife is wondering why patient is no needing furosemide?   Needing clarification before taking. [phone]  Patient wondering the reasoning for the cardiologist referral.

## 2021-01-17 NOTE — Telephone Encounter (Signed)
Patient wife Fraser Din calling in  Reviewed previous telephone note & advised her about the furosemide as well as why provider submitted referral  Patient wife concerned referral would interfere w/ patient scheduled surgery next week... advised her to contact office of dr doing surgery

## 2021-01-17 NOTE — Telephone Encounter (Signed)
We are planning to sch an appt before surgery.  Thanks

## 2021-01-20 ENCOUNTER — Other Ambulatory Visit (HOSPITAL_COMMUNITY): Payer: PPO

## 2021-01-20 DIAGNOSIS — I272 Pulmonary hypertension, unspecified: Secondary | ICD-10-CM | POA: Insufficient documentation

## 2021-01-20 NOTE — Assessment & Plan Note (Addendum)
Vernon Bishop should be medically clear for  his 2 day surgery Oct 26-27: Day #1 OLIF L4-L5, XLIF L2-L4. Day #2: Posterior spinal fusion L2-L5. No recent clinical issues: no CP, no DOE. Will get a 2D ECHO pre-op. CXR and labs Please follow your routine perioperative protocols as usual.  I would recommend that hospitalist is seeing Vernon Bishop while in the hospital to monitor his cardiovascular and pulmonary state. Thank you

## 2021-01-20 NOTE — Assessment & Plan Note (Signed)
No recent clinical issues: no CP, no DOE.  2D ECHO , CXR - started on furosemide.  Will involve hospitalist in Tom's perioperative care.

## 2021-01-22 ENCOUNTER — Inpatient Hospital Stay (HOSPITAL_COMMUNITY): Admission: RE | Admit: 2021-01-22 | Payer: PPO | Source: Home / Self Care | Admitting: Orthopedic Surgery

## 2021-01-22 ENCOUNTER — Ambulatory Visit: Payer: PPO | Admitting: Cardiology

## 2021-01-22 ENCOUNTER — Other Ambulatory Visit: Payer: Self-pay

## 2021-01-22 ENCOUNTER — Encounter (HOSPITAL_COMMUNITY): Admission: RE | Payer: Self-pay | Source: Home / Self Care

## 2021-01-22 ENCOUNTER — Encounter: Payer: Self-pay | Admitting: Cardiology

## 2021-01-22 VITALS — BP 158/58 | HR 47 | Temp 97.8°F | Resp 16 | Ht 72.0 in | Wt 208.0 lb

## 2021-01-22 DIAGNOSIS — I272 Pulmonary hypertension, unspecified: Secondary | ICD-10-CM

## 2021-01-22 DIAGNOSIS — G4733 Obstructive sleep apnea (adult) (pediatric): Secondary | ICD-10-CM

## 2021-01-22 DIAGNOSIS — Z01818 Encounter for other preprocedural examination: Secondary | ICD-10-CM

## 2021-01-22 DIAGNOSIS — I10 Essential (primary) hypertension: Secondary | ICD-10-CM | POA: Diagnosis not present

## 2021-01-22 DIAGNOSIS — R002 Palpitations: Secondary | ICD-10-CM | POA: Diagnosis not present

## 2021-01-22 DIAGNOSIS — Z8679 Personal history of other diseases of the circulatory system: Secondary | ICD-10-CM | POA: Diagnosis not present

## 2021-01-22 DIAGNOSIS — Z9989 Dependence on other enabling machines and devices: Secondary | ICD-10-CM | POA: Diagnosis not present

## 2021-01-22 SURGERY — OBLIQUE LUMBAR INTERBODY FUSION 1 LEVEL
Anesthesia: General

## 2021-01-22 NOTE — Progress Notes (Signed)
Patient referred by [name], [name], MD for pulmonary hypertension, preoperative risk stratification  Subjective:   Vernon Bishop, male    DOB: 1941/03/17, 80 y.o.   MRN: [id number]  Chief Complaint  Patient presents with   Paroxysmal atrial fibrillation   Hypertension   Medical Clearance     HPI  80 y.o. Caucasian male with hypertension, OSA, PAF, pulmonary hypertension  Patient is here with his wife today.  He was originally scheduled to undergo L4-L5 OLIF surgery with Dr. Rolena Infante, assisted by Dr. Carlis Abbott abdominal exposure.  However, surgery was delayed due to recent finding of pulmonary hypertension on echocardiogram.  He was referred to me for evaluation and management of the same, along with preoperative cardiac risk stratification.  Patient is retired, previously worked as a Programmer, applications.  Currently, his physical activity is significantly limited due to his back pain.  He walks with a cane or a grocery store cart when he is shopping.  He does not have to climb a flight of stairs anywhere.  He walks with a slow pace.  With this level of physical activity, he denies any significant chest pain or shortness of breath symptoms.  He has had longstanding history of OSA, on CPAP for at least 10 years.  He quit smoking in 1970s.  He had atrial fibrillation in 1992, after which he was hospitalized for a day.  He had spontaneous self conversion back to sinus rhythm.  He did report significant palpitations with his A. fib at 99 2.  Since then, he has not experienced similar palpitation symptoms, but does report "skipped beat" every now and then.   Past Medical History:  Diagnosis Date   Arthralgia    Atrial fibrillation ([place])    1992   Benign prostatic hyperplasia    Bradycardia    "my doctor says I have bradycardia, my heart rate is always in the 40s"   DDD (degenerative disc disease)    Depression    Diabetes mellitus    type II  - PT STATES NOT ON ANY DIABETIC MEDICINES    Dysrhythmia    aFIB   Dyssomnia    ED (erectile dysfunction)    Hyperlipidemia    Hypertension    Hypogonadism male    Joint pain    LBP (low back pain)    Multiple actinic keratoses    Muscle weakness    Upper limb   MVP (mitral valve prolapse)    Neck pain    OSA on CPAP    cpap   Osteoarthritis of hip    Osteoarthritis, hand    SEVERE LOWER BACK PAIN, AND RESTLESS LEG SYNDROME   Otalgia of right ear    Pain in left knee    Pain in thoracic spine    Palpitations    PTSD (post-traumatic stress disorder)    Seizure ([place])    Pt denies ever having seizures   Shoulder pain    Subjective visual disturbance    Swallowing problem    HX OF PILLS GETTING "STUCK" IN THROAT AT TIMES   Swelling    Type 2 diabetes mellitus ([place])    Vitamin D deficiency      Past Surgical History:  Procedure Laterality Date   APPENDECTOMY     BACK SURGERY  1987   CATARACT EXTRACTION     CERVICAL LAMINECTOMY  2004   [name], rod was placed- HAS ROM LIMITATIONS   CHOLECYSTECTOMY     COLONOSCOPY  KNEE SURGERY     left knee/arthroscopic   TOTAL HIP ARTHROPLASTY Right 03/14/2013   Procedure: RIGHT TOTAL HIP ARTHROPLASTY ANTERIOR APPROACH;  Surgeon: [name], MD;  Location: [facility];  Service: Orthopedics;  Laterality: Right;   TOTAL HIP ARTHROPLASTY Left 05/23/2019   Procedure: TOTAL HIP ARTHROPLASTY ANTERIOR APPROACH;  Surgeon: Paralee Cancel, MD;  Location: [facility];  Service: Orthopedics;  Laterality: Left;  70 mins     Social History   Tobacco Use  Smoking Status Former   Packs/day: 0.50   Years: 12.00   Pack years: 6.00   Types: Cigarettes   Quit date: 03/31/1971   Years since quitting: 49.8  Smokeless Tobacco Never  Tobacco Comments   Started at age 65    Social History   Substance and Sexual Activity  Alcohol Use Yes   Alcohol/week: 2.0 standard drinks   Types: 2 Cans of beer per week   Comment: occasionally     Family History  Problem Relation Age of Onset    Allergies Mother    Stroke Mother        Clotting disorders   Heart disease Mother    Heart disease Father    Heart attack Father 28   Heart disease Brother    Rheum arthritis Paternal Grandmother    Other Paternal Grandmother        Rheumatism   Coronary artery disease Other    Diabetes Other    Hypertension Other    Esophageal cancer Maternal Aunt    Colon cancer Neg Hx    Rectal cancer Neg Hx    Stomach cancer Neg Hx      Current Outpatient Medications on File Prior to Visit  Medication Sig Dispense Refill   acetaminophen (TYLENOL) 500 MG tablet Take 1,000 mg by mouth every 8 (eight) hours as needed for moderate pain.     amLODipine (NORVASC) 5 MG tablet Take 1 tablet (5 mg total) by mouth daily. 90 tablet 3   Cholecalciferol (VITAMIN D3) 1000 UNIT tablet Take 1,000 Units by mouth daily.       ciclopirox (PENLAC) 8 % solution APPLY OVER NAIL AND SURROUNDING SKIN. APPLY DAILY OVER PREVIOUS COAT. AFTER SEVEN (7) DAYS, MAY REMOVE WITH ALCOHOL AND AND CONTINUE CYCLE. 6.6 mL 1   clotrimazole-betamethasone (LOTRISONE) cream APPLY TO AFFECTED AREA 2 TIMES A DAY. (Patient taking differently: Apply 1 application topically 2 (two) times daily as needed (rash).) 45 g 1   Cyanocobalamin (VITAMIN B-12 CR) 1000 MCG TBCR Take 1,000 mcg by mouth daily.       FLUoxetine (PROZAC) 40 MG capsule TAKE 1 CAPSULE BY MOUTH DAILY. 90 capsule 2   furosemide (LASIX) 20 MG tablet Take 1 tablet (20 mg total) by mouth daily. 30 tablet 3   gabapentin (NEURONTIN) 300 MG capsule TAKE 1 CAPSULE BY MOUTH THREE TIMES A DAY 90 capsule 11   morphine (MS CONTIN) 30 MG 12 hr tablet Take 1 tablet (30 mg total) by mouth every 12 (twelve) hours. (Patient taking differently: Take 30 mg by mouth every 8 (eight) hours.) 60 tablet 0   Polyethyl Glycol-Propyl Glycol (LUBRICANT EYE DROPS) 0.4-0.3 % SOLN Place 1 drop into both eyes 3 (three) times daily as needed (dry/irritated eyes.).     polyethylene glycol (MIRALAX / GLYCOLAX)  17 g packet Take 17 g by mouth 2 (two) times daily. (Patient taking differently: Take 17 g by mouth daily.) 28 packet 0   propranolol (INDERAL) 80 MG tablet TAKE 1  TABLET BY MOUTH 3 TIMES DAILY. 270 tablet 3   Saw Palmetto 450 MG CAPS Take 450 mg by mouth daily.     sodium chloride (OCEAN) 0.65 % SOLN nasal spray Place 1 spray into both nostrils as needed for congestion.     testosterone cypionate (DEPOTESTOSTERONE CYPIONATE) 200 MG/ML injection INJECT 3 MILLILITERS INTO THE MUSCLE EVERY 14 DAYS 10 mL 5   tiZANidine (ZANAFLEX) 4 MG tablet TAKE 1 TABLET BY MOUTH EVERY 8 HOURS AS NEEDED FOR MUSCLE SPASMS. 60 tablet 1   traZODone (DESYREL) 50 MG tablet TAKE 1 TO 2 TABLETS BY MOUTH AT BEDTIME. (Patient taking differently: Take 100 mg by mouth at bedtime.) 60 tablet 5   Zinc 30 MG TABS Take 30 mg by mouth daily.     No current facility-administered medications on file prior to visit.    Cardiovascular and other pertinent studies:   EKG 01/22/2021: Sinus bradycardia 48 bpm   Echocardiogram 01/15/2021:  1. Left ventricular ejection fraction, by estimation, is 55 to 60%. The  left ventricle has normal function. The left ventricle has no regional  wall motion abnormalities. Left ventricular diastolic parameters are  consistent with Grade I diastolic dysfunction (impaired relaxation).   2. Right ventricular systolic function is normal. The right ventricular  size is normal. There is moderately elevated pulmonary artery systolic  pressure. The estimated right ventricular systolic pressure is 50.9 mmHg.   3. Left atrial size was moderately dilated.   4. The mitral valve is normal in structure. Trivial mitral valve  regurgitation. No evidence of mitral stenosis.   5. The aortic valve is normal in structure. Aortic valve regurgitation is  trivial. No aortic stenosis is present.   6. Aortic dilatation noted. There is mild dilatation of the ascending  aorta, measuring 40 mm.   7. The inferior vena  cava is normal in size with greater than 50%  respiratory variability, suggesting right atrial pressure of 3 mmHg.   Comparison(s): Changes from prior study are noted. 05/09/12 EF 55-60%. PA  pressure 49mHg.   Recent labs: 01/14/2021: Glucose 79, BUN/Cr 18/1.01. EGFR >60. Na/K 138/3.9. Rest of the CMP normal H/H 15/46. MCV 91. Platelets 174  2021: Chol 178, TG 79, HDL 37, LDL 120    Review of Systems  Cardiovascular:  Positive for palpitations. Negative for chest pain, dyspnea on exertion, leg swelling and syncope.  Musculoskeletal:  Positive for back pain.        Vitals:   01/22/21 1051  BP: (!) 158/58  Pulse: (!) 47  Resp: 16  Temp: 97.8 F (36.6 C)  SpO2: 96%     Body mass index is 28.21 kg/m. Filed Weights   01/22/21 1051  Weight: 208 lb (94.3 kg)     Objective:   Physical Exam Vitals and nursing note reviewed.  Constitutional:      General: He is not in acute distress. Neck:     Vascular: No JVD.  Cardiovascular:     Rate and Rhythm: Regular rhythm. Bradycardia present.     Heart sounds: Normal heart sounds. No murmur heard. Pulmonary:     Effort: Pulmonary effort is normal.     Breath sounds: Normal breath sounds. No wheezing or rales.  Musculoskeletal:     Right lower leg: No edema.     Left lower leg: No edema.         Assessment & Recommendations:    80y.o. Caucasian male with hypertension, OSA, PAF, pulmonary hypertension  Pulmonary hypertension:  I personally reviewed and independently interpreted prior EKG as an echocardiogram. Patient has moderate left atrial dilatation, grade 1 diastolic dysfunction with preserved LVEF, no right-sided dilation, moderate TR, with estimated RVSP 45 mmHg. 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  I do not anticipate this to have any  prohibitive impact on his upcoming surgery. Continue ongoing management of OSA. Given his moderate left atrial dilatation, I suspect he may have had more A. fib than what is aware of.  Ideally, would like to start with 2-week cardiac telemetry.  This can be performed after upoming spine surgery. Even if he were to have atrial fibrillation, management would most likely be watchful monitoring.  His resting heart rate is in 40s and does not seem to have significant symptoms related with any possible A. fib.  Should he have A. fib, he will need anticoagulation given CHA2DS2-VASc score of at least 3.  This will be best initiated after any upcoming spine surgery.  Preop risk stratification: Given his low baseline functional capacity, I am unable to evaluate for any angina or angina equivalent symptoms.  Given that the surgery is elective, I recommend pharmacological nuclear stress test.  Unless any high risk findings identified, may be able to proceed with spine surgery with acceptable cardiac risk.  Hypertension: Blood pressure elevated today, usually well controlled.  No change made to his antihypertensive therapy today.  Further recommendations after above testing.  Thank you for referring the patient to [place]. Please feel free to contact with any questions.   Nigel Mormon, MD Pager: [phone] Office: [phone]

## 2021-01-23 ENCOUNTER — Inpatient Hospital Stay: Admit: 2021-01-23 | Payer: PPO | Admitting: Orthopedic Surgery

## 2021-01-23 SURGERY — POSTERIOR LUMBAR FUSION 3 LEVEL
Anesthesia: General

## 2021-01-28 DIAGNOSIS — G4733 Obstructive sleep apnea (adult) (pediatric): Secondary | ICD-10-CM | POA: Diagnosis not present

## 2021-01-29 ENCOUNTER — Other Ambulatory Visit: Payer: Self-pay

## 2021-01-29 ENCOUNTER — Ambulatory Visit: Payer: PPO

## 2021-01-29 DIAGNOSIS — Z01818 Encounter for other preprocedural examination: Secondary | ICD-10-CM

## 2021-01-29 DIAGNOSIS — Z01810 Encounter for preprocedural cardiovascular examination: Secondary | ICD-10-CM | POA: Diagnosis not present

## 2021-01-31 LAB — PCV MYOCARDIAL PERFUSION WITH LEXISCAN: ST Depression (mm): 0 mm

## 2021-02-02 NOTE — Progress Notes (Signed)
No significant heart muscle circulation abnormalities noted on stress test.  Will need pre-op letter. Okay to proceed.   Thanks MJP

## 2021-02-03 NOTE — Progress Notes (Signed)
Patient called back he is aware of results

## 2021-02-03 NOTE — Progress Notes (Signed)
Called pt no answer, left a vm Bev are you able to do this pre-op?

## 2021-02-04 DIAGNOSIS — M5451 Vertebrogenic low back pain: Secondary | ICD-10-CM | POA: Diagnosis not present

## 2021-02-07 ENCOUNTER — Ambulatory Visit: Payer: Self-pay | Admitting: Orthopedic Surgery

## 2021-02-07 DIAGNOSIS — Z01818 Encounter for other preprocedural examination: Secondary | ICD-10-CM

## 2021-02-10 ENCOUNTER — Other Ambulatory Visit: Payer: Self-pay

## 2021-02-26 ENCOUNTER — Other Ambulatory Visit: Payer: Self-pay | Admitting: Internal Medicine

## 2021-02-27 DIAGNOSIS — G4733 Obstructive sleep apnea (adult) (pediatric): Secondary | ICD-10-CM | POA: Diagnosis not present

## 2021-02-27 DIAGNOSIS — Z5181 Encounter for therapeutic drug level monitoring: Secondary | ICD-10-CM | POA: Diagnosis not present

## 2021-02-27 DIAGNOSIS — Z79891 Long term (current) use of opiate analgesic: Secondary | ICD-10-CM | POA: Diagnosis not present

## 2021-02-27 DIAGNOSIS — G894 Chronic pain syndrome: Secondary | ICD-10-CM | POA: Diagnosis not present

## 2021-02-28 ENCOUNTER — Ambulatory Visit: Payer: Self-pay | Admitting: Orthopedic Surgery

## 2021-02-28 NOTE — H&P (Deleted)
  The note originally documented on this encounter has been moved the the encounter in which it belongs.  

## 2021-02-28 NOTE — H&P (Signed)
Subjective:   Left foot drop at baseline Pain management with Ramos. Previously postponed until we got cardiac clearance, which we have. Also have updated PCP clearance LIF L4-5, XLIF L2-4 Dr. Carlis Abbott, PSFI L2-5 w/ possible decompression [facility] 03/12/21, 03/13/21  Patient Active Problem List   Diagnosis Date Noted   History of atrial fibrillation 01/22/2021   Pulmonary hypertension, unspecified ([place]) 01/20/2021   Chronic back pain 01/07/2021   S/P hip replacement, left 05/23/2019   Ear pain, right 04/05/2019   Leg weakness, bilateral 02/01/2019   Arm weakness 04/07/2018   BPH (benign prostatic hyperplasia) 04/23/2017   Hip pain, chronic, left 05/06/2016   Delusional thoughts ([place]) 05/06/2016   CTS (carpal tunnel syndrome) 07/29/2015   Ulnar neuropathy 07/29/2015   Generalized anxiety disorder 07/29/2015   Pre-op evaluation 01/28/2015   Insomnia 04/13/2014   Left knee pain 10/10/2013   Delusional disorder ([place]) 10/10/2013   Overweight (BMI 25.0-29.9) 03/15/2013   S/P total hip arthroplasty 03/14/2013   Hip osteoarthritis 03/06/2013   Bradycardia 10/09/2012   Sinusitis, acute 06/29/2012   Actinic keratoses 05/24/2012   Hyponatremia 10/23/2011   Edema 10/23/2011   Shoulder pain, acute 07/13/2011   Arthralgia 06/08/2011   Cervical pain 01/28/2011   Neoplasm of uncertain behavior of skin 06/16/2010   BRADYCARDIA 03/17/2010   SINUSITIS, ACUTE 12/11/2009   PAIN IN THORACIC SPINE 04/26/2009   TOBACCO USE, QUIT 02/19/2009   OSA on CPAP 11/15/2008   Hypogonadism male 05/02/2008   Vitamin B12 deficiency 08/26/2007   VISUAL CHANGES 08/25/2007   OTHER CONVULSIONS 08/25/2007   SLEEP DEPRIVATION 08/25/2007   Palpitations 08/25/2007   HIP PAIN 04/29/2007   Osteoarthritis 02/15/2007   Dyslipidemia 02/14/2007   Depression 02/14/2007   Atrial fibrillation ([place]) 02/14/2007   Seborrheic dermatitis 02/14/2007   Diabetes mellitus type 2, diet-controlled ([place]) 01/11/2007   Essential  hypertension 01/11/2007   LOW BACK PAIN 01/11/2007   Past Medical History:  Diagnosis Date   Arthralgia    Atrial fibrillation ([place])    1992   Benign prostatic hyperplasia    Bradycardia    "my doctor says I have bradycardia, my heart rate is always in the 40s"   DDD (degenerative disc disease)    Depression    Diabetes mellitus    type II  - PT STATES NOT ON ANY DIABETIC MEDICINES   Dysrhythmia    aFIB   Dyssomnia    ED (erectile dysfunction)    Hyperlipidemia    Hypertension    Hypogonadism male    Joint pain    LBP (low back pain)    Multiple actinic keratoses    Muscle weakness    Upper limb   MVP (mitral valve prolapse)    Neck pain    OSA on CPAP    cpap   Osteoarthritis of hip    Osteoarthritis, hand    SEVERE LOWER BACK PAIN, AND RESTLESS LEG SYNDROME   Otalgia of right ear    Pain in left knee    Pain in thoracic spine    Palpitations    PTSD (post-traumatic stress disorder)    Seizure ([place])    Pt denies ever having seizures   Shoulder pain    Subjective visual disturbance    Swallowing problem    HX OF PILLS GETTING "STUCK" IN THROAT AT TIMES   Swelling    Type 2 diabetes mellitus ([place])    Vitamin D deficiency     Past Surgical History:  Procedure Laterality Date  APPENDECTOMY     BACK SURGERY  1987   CATARACT EXTRACTION     CERVICAL LAMINECTOMY  2004   [name], rod was placed- HAS ROM LIMITATIONS   CHOLECYSTECTOMY     COLONOSCOPY     KNEE SURGERY     left knee/arthroscopic   TOTAL HIP ARTHROPLASTY Right 03/14/2013   Procedure: RIGHT TOTAL HIP ARTHROPLASTY ANTERIOR APPROACH;  Surgeon: [name], MD;  Location: [facility];  Service: Orthopedics;  Laterality: Right;   TOTAL HIP ARTHROPLASTY Left 05/23/2019   Procedure: TOTAL HIP ARTHROPLASTY ANTERIOR APPROACH;  Surgeon: Paralee Cancel, MD;  Location: [facility];  Service: Orthopedics;  Laterality: Left;  70 mins    Current Outpatient Medications  Medication Sig Dispense Refill Last Dose    acetaminophen (TYLENOL) 500 MG tablet Take 1,000 mg by mouth every 8 (eight) hours as needed for moderate pain.      amLODipine (NORVASC) 5 MG tablet Take 1 tablet (5 mg total) by mouth daily. 90 tablet 3    Cholecalciferol (VITAMIN D3) 1000 UNIT tablet Take 1,000 Units by mouth daily.        ciclopirox (PENLAC) 8 % solution APPLY OVER NAIL AND SURROUNDING SKIN. APPLY DAILY OVER PREVIOUS COAT. AFTER SEVEN (7) DAYS, MAY REMOVE WITH ALCOHOL AND AND CONTINUE CYCLE. 6.6 mL 1    clotrimazole-betamethasone (LOTRISONE) cream APPLY TO AFFECTED AREA 2 TIMES A DAY. (Patient taking differently: Apply 1 application topically 2 (two) times daily as needed (rash).) 45 g 1    Cyanocobalamin (VITAMIN B-12 CR) 1000 MCG TBCR Take 1,000 mcg by mouth daily.        FLUoxetine (PROZAC) 40 MG capsule TAKE 1 CAPSULE BY MOUTH DAILY. 90 capsule 2    furosemide (LASIX) 20 MG tablet Take 1 tablet (20 mg total) by mouth daily. 30 tablet 3    gabapentin (NEURONTIN) 300 MG capsule TAKE 1 CAPSULE BY MOUTH THREE TIMES A DAY 90 capsule 11    morphine (MS CONTIN) 30 MG 12 hr tablet Take 1 tablet (30 mg total) by mouth every 12 (twelve) hours. (Patient taking differently: Take 30 mg by mouth every 8 (eight) hours.) 60 tablet 0    Polyethyl Glycol-Propyl Glycol (LUBRICANT EYE DROPS) 0.4-0.3 % SOLN Place 1 drop into both eyes 3 (three) times daily as needed (dry/irritated eyes.).      polyethylene glycol (MIRALAX / GLYCOLAX) 17 g packet Take 17 g by mouth 2 (two) times daily. (Patient taking differently: Take 17 g by mouth daily.) 28 packet 0    propranolol (INDERAL) 80 MG tablet TAKE 1 TABLET BY MOUTH 3 TIMES DAILY. 270 tablet 3    Saw Palmetto 450 MG CAPS Take 450 mg by mouth daily.      sodium chloride (OCEAN) 0.65 % SOLN nasal spray Place 1 spray into both nostrils as needed for congestion.      testosterone cypionate (DEPOTESTOSTERONE CYPIONATE) 200 MG/ML injection INJECT 3 MILLILITERS INTO THE MUSCLE EVERY 14 DAYS 10 mL 5     tiZANidine (ZANAFLEX) 4 MG tablet TAKE 1 TABLET BY MOUTH EVERY 8 HOURS AS NEEDED FOR MUSCLE SPASMS. 60 tablet 1    traZODone (DESYREL) 50 MG tablet TAKE 1 TO 2 TABLETS BY MOUTH AT BEDTIME. 60 tablet 5    Zinc 30 MG TABS Take 30 mg by mouth daily.      No current facility-administered medications for this visit.   Allergies  Allergen Reactions   Abilify [Aripiprazole]     SOB feeling   Cymbalta [  Duloxetine Hcl]     constipation   Digoxin And Related     HR 41   Prednisolone     Edema, wt gain   Temazepam     Pt unsure of this     Social History   Tobacco Use   Smoking status: Former    Packs/day: 0.50    Years: 12.00    Pack years: 6.00    Types: Cigarettes    Quit date: 03/31/1971    Years since quitting: 49.9   Smokeless tobacco: Never   Tobacco comments:    Started at age 35  Substance Use Topics   Alcohol use: Yes    Alcohol/week: 2.0 standard drinks    Types: 2 Cans of beer per week    Comment: occasionally    Family History  Problem Relation Age of Onset   Allergies Mother    Stroke Mother        Clotting disorders   Heart disease Mother    Heart disease Father    Heart attack Father 50   Heart disease Brother    Rheum arthritis Paternal Grandmother    Other Paternal Grandmother        Rheumatism   Coronary artery disease Other    Diabetes Other    Hypertension Other    Esophageal cancer Maternal Aunt    Colon cancer Neg Hx    Rectal cancer Neg Hx    Stomach cancer Neg Hx     Review of Systems Pertinent items are noted in HPI.  Objective:   General: Alert and oriented 3 Ambulation: Abnormal uses a left AFO brace and cane for ambulation Heart: Bradycardic regular rhythm no rubs, murmurs, or gallops Lungs: Clear auscultation bilaterally Abdomen: Bowel sounds 4, nondistended, nontender, no rebound tenderness. No loss of bladder or bowel control Neuro: He continues to have severe debilitating back pain with bilateral lower extremity numbness and  dysesthesias as well as a chronic left foot drop. X-rays confirm multilevel degenerative disc disease with degenerative scoliosis as well as a degenerative spondylolisthesis at L4-5 Lumbar MRI: No significant canal or foraminal stenosis T12-L1. Mild to moderate degenerative changes at L1/2. Large right hard disc osteophyte at L2-3 producing moderate central canal stenosis and moderate to severe foraminal stenosis. L3-4: Severe central canal stenosis and moderate to severe left foraminal stenosis. Grade 1 anterolisthesis at L4-5 producing severe bilateral lateral recess stenosis. Moderate to severe degenerative disc disease L2-5. Moderate left foraminal stenosis at L5-S1 with some compression to the exiting L5 nerve root. Mild central stenosis.  Assessment:   Diagnosis: Denys returns today for preop evaluation. There is been no change in his clinical exam. He has subsequently been cleared for surgery by his primary care physician and cardiologist. We have again gone over how extensive the surgery is and the potential for complications and worsening pain. Both he and his wife understand that this is a significant surgery and that there are potential risks that he will not improve or be worse. All of his questions were addressed.   Plan:   Treatment plan: Plan on moving forward with scheduling the three-level lumbar fusion L2-5 over a 2-day period. The first day will be the interbody fixation with XLIF L2-4 and OLIF at  L4 5. The second day we will move forward with posterior pedicle screw fixation L2-5.  Plan: Day 1 will be an L4-5 oblique lumbar interbody fusion and a 2 level L2-3, L3-4 extreme lateral interbody fusion. Day 2 will be  possible decompression with posterior spinal fusion instrumentation from L2-L5 We have obtained preoperative medical clearance for the patient's primary care provider and cardiologist. I reviewed the patient's medication list with him. He is not on any blood thinners.  Not using any aspirin. Not using NSAIDs. He is in pain management with Dr. Nelva Bush on morphine 30mg  TID. We will plan to have Dr. Nelva Bush manage the patient's pain postoperatively We have also discussed the post-operative recovery period to include: bathing/showering restrictions, wound healing, activity (and driving) restrictions, medications/pain mangement. We have also discussed post-operative redflags to include: signs and symptoms of postoperative infection, DVT/PE. Discharge instructions were reviewed with the patient and he was given a copy. Patient has LSO brace fitting and preop testing at the hospital All questions were invited and answered.  Follow-up: 2 weeks postop

## 2021-03-03 ENCOUNTER — Other Ambulatory Visit: Payer: Self-pay

## 2021-03-03 ENCOUNTER — Ambulatory Visit (INDEPENDENT_AMBULATORY_CARE_PROVIDER_SITE_OTHER): Payer: PPO | Admitting: Internal Medicine

## 2021-03-03 ENCOUNTER — Encounter: Payer: Self-pay | Admitting: Internal Medicine

## 2021-03-03 DIAGNOSIS — Z8679 Personal history of other diseases of the circulatory system: Secondary | ICD-10-CM | POA: Diagnosis not present

## 2021-03-03 DIAGNOSIS — Z01818 Encounter for other preprocedural examination: Secondary | ICD-10-CM

## 2021-03-03 DIAGNOSIS — I272 Pulmonary hypertension, unspecified: Secondary | ICD-10-CM

## 2021-03-03 DIAGNOSIS — I10 Essential (primary) hypertension: Secondary | ICD-10-CM

## 2021-03-03 NOTE — Assessment & Plan Note (Signed)
Per Dr Bonney Roussel note: "Pulmonary hypertension: I personally reviewed and independently interpreted prior EKG as an echocardiogram. Patient has moderate left atrial dilatation, grade 1 diastolic dysfunction with preserved LVEF, no right-sided dilation, moderate TR, with estimated RVSP 45 mmHg. 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  I do not anticipate this to have any prohibitive impact on his upcoming surgery. Continue ongoing management of OSA. Given his moderate left atrial dilatation, I suspect he may have had more A. fib than what is aware of.  Ideally, would like to start with 2-week cardiac telemetry.  This can be performed after upoming spine surgery. Even if he were to have atrial fibrillation, management would most likely be watchful monitoring.  His resting heart rate is in 40s and does not seem to have significant symptoms related with any possible A. fib.  Should he have A. fib, he will need anticoagulation given CHA2DS2-VASc score of at least 3.  This will be best initiated after any upcoming spine surgery.   Preop risk stratification: Given his low baseline functional capacity, I am unable to evaluate for any angina or angina equivalent symptoms.  Given that the surgery is elective, I recommend pharmacological nuclear stress test.  Unless any high risk findings identified, may be able to proceed with spine surgery with acceptable cardiac risk.

## 2021-03-03 NOTE — Assessment & Plan Note (Signed)
Elevated BP SBP 140 at home

## 2021-03-03 NOTE — Assessment & Plan Note (Signed)
Will monitor for A fib

## 2021-03-03 NOTE — Assessment & Plan Note (Addendum)
Per Dr Bonney Roussel note:  "Pulmonary hypertension: I personally reviewed and independently interpreted prior EKG as an echocardiogram. Patient has moderate left atrial dilatation, grade 1 diastolic dysfunction with preserved LVEF, no right-sided dilation, moderate TR, with estimated RVSP 45 mmHg. 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  I do not anticipate this to have any prohibitive impact on his upcoming surgery. Continue ongoing management of OSA. Given his moderate left atrial dilatation, I suspect he may have had more A. fib than what is aware of.  Ideally, would like to start with 2-week cardiac telemetry.  This can be performed after upoming spine surgery. Even if he were to have atrial fibrillation, management would most likely be watchful monitoring.  His resting heart rate is in 40s and does not seem to have significant symptoms related with any possible A. fib.  Should he have A. fib, he will need anticoagulation given CHA2DS2-VASc score of at least 3.  This will be best initiated after any upcoming spine surgery.   Preop risk stratification: Given his low baseline functional capacity, I am unable to evaluate for any angina or angina equivalent symptoms.  Given that the surgery is elective, I recommend pharmacological nuclear stress test.  Unless any high risk findings identified, may be able to proceed with spine surgery with acceptable cardiac risk.   Hypertension: Blood pressure elevated today, usually well controlled.  No change made to his antihypertensive therapy today."  He is clear for surgery

## 2021-03-03 NOTE — Progress Notes (Signed)
Subjective:  Patient ID: Vernon Bishop, male    DOB: 1940-08-23  Age: 80 y.o. MRN: [id number]  CC: Follow-up (6 month f/u)   HPI Vernon Bishop presents for pre-op f/u - surgery is next week. F/u pulmonary HTN, A fib. Stress test was ok  No falls  Per Dr Bonney Roussel note:  "Pulmonary hypertension: I personally reviewed and independently interpreted prior EKG as an echocardiogram. Patient has moderate left atrial dilatation, grade 1 diastolic dysfunction with preserved LVEF, no right-sided dilation, moderate TR, with estimated RVSP 45 mmHg. 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  I do not anticipate this to have any prohibitive impact on his upcoming surgery. Continue ongoing management of OSA. Given his moderate left atrial dilatation, I suspect he may have had more A. fib than what is aware of.  Ideally, would like to start with 2-week cardiac telemetry.  This can be performed after upoming spine surgery. Even if he were to have atrial fibrillation, management would most likely be watchful monitoring.  His resting heart rate is in 40s and does not seem to have significant symptoms related with any possible A. fib.  Should he have A. fib, he will need anticoagulation given CHA2DS2-VASc score of at least 3.  This will be best initiated after any upcoming spine surgery.   Preop risk stratification: Given his low baseline functional capacity, I am unable to evaluate for any angina or angina equivalent symptoms.  Given that the surgery is elective, I recommend pharmacological nuclear stress test.  Unless any high risk findings identified, may be able to proceed with spine surgery with acceptable cardiac risk.   Hypertension: Blood pressure elevated today, usually well controlled.  No change made to his antihypertensive therapy  today."  Outpatient Medications Prior to Visit  Medication Sig Dispense Refill   acetaminophen (TYLENOL) 500 MG tablet Take 1,000 mg by mouth every 8 (eight) hours as needed for moderate pain.     amLODipine (NORVASC) 5 MG tablet Take 1 tablet (5 mg total) by mouth daily. 90 tablet 3   Cholecalciferol (VITAMIN D3) 1000 UNIT tablet Take 1,000 Units by mouth daily.       ciclopirox (PENLAC) 8 % solution APPLY OVER NAIL AND SURROUNDING SKIN. APPLY DAILY OVER PREVIOUS COAT. AFTER SEVEN (7) DAYS, MAY REMOVE WITH ALCOHOL AND AND CONTINUE CYCLE. 6.6 mL 1   clotrimazole-betamethasone (LOTRISONE) cream APPLY TO AFFECTED AREA 2 TIMES A DAY. (Patient taking differently: Apply 1 application topically 2 (two) times daily as needed (rash).) 45 g 1   Cyanocobalamin (VITAMIN B-12 CR) 1000 MCG TBCR Take 1,000 mcg by mouth daily.       FLUoxetine (PROZAC) 40 MG capsule TAKE 1 CAPSULE BY MOUTH DAILY. 90 capsule 2   furosemide (LASIX) 20 MG tablet Take 1 tablet (20 mg total) by mouth daily. 30 tablet 3   gabapentin (NEURONTIN) 300 MG capsule TAKE 1 CAPSULE BY MOUTH THREE TIMES A DAY 90 capsule 11   morphine (MS CONTIN) 30 MG 12 hr tablet Take 1 tablet (30 mg total) by mouth every 12 (twelve) hours. (Patient taking differently: Take 30 mg by mouth every 8 (eight) hours.) 60 tablet 0   Polyethyl Glycol-Propyl Glycol (LUBRICANT EYE DROPS) 0.4-0.3 % SOLN Place 1 drop into both eyes 3 (three) times daily as needed (dry/irritated eyes.).     polyethylene glycol (MIRALAX /  GLYCOLAX) 17 g packet Take 17 g by mouth 2 (two) times daily. (Patient taking differently: Take 17 g by mouth daily.) 28 packet 0   propranolol (INDERAL) 80 MG tablet TAKE 1 TABLET BY MOUTH 3 TIMES DAILY. 270 tablet 3   Saw Palmetto 450 MG CAPS Take 450 mg by mouth daily.     sodium chloride (OCEAN) 0.65 % SOLN nasal spray Place 1 spray into both nostrils as needed for congestion.     testosterone cypionate (DEPOTESTOSTERONE CYPIONATE) 200 MG/ML injection  INJECT 3 MILLILITERS INTO THE MUSCLE EVERY 14 DAYS 10 mL 5   tiZANidine (ZANAFLEX) 4 MG tablet TAKE 1 TABLET BY MOUTH EVERY 8 HOURS AS NEEDED FOR MUSCLE SPASMS. 60 tablet 1   traZODone (DESYREL) 50 MG tablet TAKE 1 TO 2 TABLETS BY MOUTH AT BEDTIME. 60 tablet 5   Zinc 30 MG TABS Take 30 mg by mouth daily.     No facility-administered medications prior to visit.    ROS: Review of Systems  Constitutional:  Positive for fatigue. Negative for appetite change and unexpected weight change.  HENT:  Negative for congestion, nosebleeds, sneezing, sore throat and trouble swallowing.   Eyes:  Negative for itching and visual disturbance.  Respiratory:  Negative for cough.   Cardiovascular:  Negative for chest pain, palpitations and leg swelling.  Gastrointestinal:  Negative for abdominal distention, blood in stool, diarrhea and nausea.  Genitourinary:  Negative for frequency and hematuria.  Musculoskeletal:  Positive for back pain and gait problem. Negative for joint swelling and neck pain.  Skin:  Negative for rash.  Neurological:  Negative for dizziness, tremors, speech difficulty and weakness.  Psychiatric/Behavioral:  Negative for agitation, dysphoric mood and sleep disturbance. The patient is not nervous/anxious.    Objective:  BP (!) 154/60 (BP Location: Left Arm)   Pulse (!) 47   Temp 98.3 F (36.8 C) (Oral)   Ht 6' (1.829 m)   Wt 208 lb 9.6 oz (94.6 kg)   SpO2 94%   BMI 28.29 kg/m   BP Readings from Last 3 Encounters:  03/03/21 (!) 154/60  01/22/21 (!) 158/58  01/14/21 (!) 152/79    Wt Readings from Last 3 Encounters:  03/03/21 208 lb 9.6 oz (94.6 kg)  01/22/21 208 lb (94.3 kg)  01/14/21 211 lb 3.2 oz (95.8 kg)    Physical Exam Constitutional:      General: He is not in acute distress.    Appearance: He is well-developed. He is obese.     Comments: NAD  Eyes:     Conjunctiva/sclera: Conjunctivae normal.     Pupils: Pupils are equal, round, and reactive to light.  Neck:      Thyroid: No thyromegaly.     Vascular: No JVD.  Cardiovascular:     Rate and Rhythm: Normal rate and regular rhythm.     Heart sounds: Normal heart sounds. No murmur heard.   No friction rub. No gallop.  Pulmonary:     Effort: Pulmonary effort is normal. No respiratory distress.     Breath sounds: Normal breath sounds. No wheezing or rales.  Chest:     Chest wall: No tenderness.  Abdominal:     General: Bowel sounds are normal. There is no distension.     Palpations: Abdomen is soft. There is no mass.     Tenderness: There is no abdominal tenderness. There is no guarding or rebound.  Musculoskeletal:        General: No tenderness. Normal range of  motion.     Cervical back: Normal range of motion.  Lymphadenopathy:     Cervical: No cervical adenopathy.  Skin:    General: Skin is warm and dry.     Findings: No rash.  Neurological:     Mental Status: He is alert and oriented to person, place, and time.     Cranial Nerves: No cranial nerve deficit.     Motor: No abnormal muscle tone.     Coordination: Coordination normal.     Gait: Gait normal.     Deep Tendon Reflexes: Reflexes are normal and symmetric.  Psychiatric:        Behavior: Behavior normal.        Thought Content: Thought content normal.        Judgment: Judgment normal.  Using a cane LS w/pain  Lab Results  Component Value Date   WBC 5.8 01/14/2021   HGB 15.4 01/14/2021   HCT 46.9 01/14/2021   PLT 174 01/14/2021   GLUCOSE 79 01/14/2021   CHOL 178 12/20/2019   TRIG 99 12/20/2019   HDL 37 (L) 12/20/2019   LDLCALC 120 (H) 12/20/2019   ALT 19 08/30/2020   AST 27 08/30/2020   NA 138 01/14/2021   K 3.9 01/14/2021   CL 101 01/14/2021   CREATININE 1.01 01/14/2021   BUN 18 01/14/2021   CO2 28 01/14/2021   TSH 3.59 08/30/2020   PSA 0.73 12/20/2019   INR 1.0 01/14/2021   HGBA1C 4.9 01/14/2021   MICROALBUR 3.9 12/20/2019    ECHOCARDIOGRAM COMPLETE  Result Date: 01/15/2021    ECHOCARDIOGRAM REPORT    Patient Name:   GWENDOLYN NISHI Date of Exam: 01/15/2021 Medical Rec #:  [id number]       Height:       72.0 in Accession #:    [id number]      Weight:       211.2 lb Date of Birth:  29-Aug-1940        BSA:          2.180 m Patient Age:    32 years        BP:           182/74 mmHg Patient Gender: M               HR:           5 bpm. Exam Location:  [facility] Procedure: 2D Echo, 3D Echo, Cardiac Doppler, Color Doppler and Strain Analysis Indications:    R00.2 Palpitation  History:        Patient has prior history of Echocardiogram examinations, most                 recent 05/09/2012. Arrythmias:Atrial Fibrillation, Bradycardia                 and Palpitation, Signs/Symptoms:Edema; Risk                 Factors:Hypertension, Sleep Apnea, Diabetes and Former Smoker.  Sonographer:    Basilia Jumbo BS, RDCS Referring Phys: [place]  1. Left ventricular ejection fraction, by estimation, is 55 to 60%. The left ventricle has normal function. The left ventricle has no regional wall motion abnormalities. Left ventricular diastolic parameters are consistent with Grade I diastolic dysfunction (impaired relaxation).  2. Right ventricular systolic function is normal. The right ventricular size is normal. There is moderately elevated pulmonary artery systolic pressure. The estimated right ventricular systolic pressure is 47.2  mmHg.  3. Left atrial size was moderately dilated.  4. The mitral valve is normal in structure. Trivial mitral valve regurgitation. No evidence of mitral stenosis.  5. The aortic valve is normal in structure. Aortic valve regurgitation is trivial. No aortic stenosis is present.  6. Aortic dilatation noted. There is mild dilatation of the ascending aorta, measuring 40 mm.  7. The inferior vena cava is normal in size with greater than 50% respiratory variability, suggesting right atrial pressure of 3 mmHg. Comparison(s): Changes from prior study are noted. 05/09/12 EF 55-60%. PA pressure  43mmHg. FINDINGS  Left Ventricle: Left ventricular ejection fraction, by estimation, is 55 to 60%. The left ventricle has normal function. The left ventricle has no regional wall motion abnormalities. The left ventricular internal cavity size was normal in size. There is  no left ventricular hypertrophy. Left ventricular diastolic parameters are consistent with Grade I diastolic dysfunction (impaired relaxation). Right Ventricle: The right ventricular size is normal. No increase in right ventricular wall thickness. Right ventricular systolic function is normal. There is moderately elevated pulmonary artery systolic pressure. The tricuspid regurgitant velocity is 3.13 m/s, and with an assumed right atrial pressure of 8 mmHg, the estimated right ventricular systolic pressure is 57.3 mmHg. Left Atrium: Left atrial size was moderately dilated. Right Atrium: Right atrial size was normal in size. Pericardium: There is no evidence of pericardial effusion. Mitral Valve: The mitral valve is normal in structure. Trivial mitral valve regurgitation. No evidence of mitral valve stenosis. Tricuspid Valve: The tricuspid valve is normal in structure. Tricuspid valve regurgitation is trivial. No evidence of tricuspid stenosis. Aortic Valve: The aortic valve is normal in structure. Aortic valve regurgitation is trivial. No aortic stenosis is present. Pulmonic Valve: The pulmonic valve was normal in structure. Pulmonic valve regurgitation is not visualized. No evidence of pulmonic stenosis. Aorta: Aortic dilatation noted. There is mild dilatation of the ascending aorta, measuring 40 mm. Venous: The inferior vena cava is normal in size with greater than 50% respiratory variability, suggesting right atrial pressure of 3 mmHg. IAS/Shunts: No atrial level shunt detected by color flow Doppler.  LEFT VENTRICLE PLAX 2D LVIDd:         5.50 cm   Diastology LVIDs:         3.40 cm   LV e' medial:    5.56 cm/s LV PW:         1.10 cm   LV E/e'  medial:  12.2 LV IVS:        1.00 cm   LV e' lateral:   9.24 cm/s LVOT diam:     2.30 cm   LV E/e' lateral: 7.4 LV SV:         128 LV SV Index:   59        2D Longitudinal Strain LVOT Area:     4.15 cm  2D Strain GLS (A2C):   -25.8 %                          2D Strain GLS (A3C):   -20.8 %                          2D Strain GLS (A4C):   -24.4 %                          2D Strain GLS Avg:     -23.7 %  3D Volume EF:                          3D EF:        59 %                          LV EDV:       176 ml                          LV ESV:       72 ml                          LV SV:        104 ml RIGHT VENTRICLE             IVC RV Basal diam:  4.50 cm     IVC diam: 2.00 cm RV S prime:     11.10 cm/s RVSP:           47.2 mmHg LEFT ATRIUM              Index        RIGHT ATRIUM           Index LA diam:        5.60 cm  2.57 cm/m   RA Pressure: 8.00 mmHg LA Vol (A2C):   100.0 ml 45.86 ml/m  RA Area:     17.80 cm LA Vol (A4C):   93.6 ml  42.93 ml/m  RA Volume:   42.80 ml  19.63 ml/m LA Biplane Vol: 98.8 ml  45.31 ml/m  AORTIC VALVE LVOT Vmax:   124.00 cm/s LVOT Vmean:  78.600 cm/s LVOT VTI:    0.308 m  AORTA Ao Root diam: 3.70 cm Ao Asc diam:  4.00 cm MITRAL VALVE               TRICUSPID VALVE                            TR Peak grad:   39.2 mmHg MV Decel Time: 290 msec    TR Vmax:        313.00 cm/s MV E velocity: 68.10 cm/s  Estimated RAP:  8.00 mmHg MV A velocity: 75.50 cm/s  RVSP:           47.2 mmHg MV E/A ratio:  0.90                            SHUNTS                            Systemic VTI:  0.31 m                            Systemic Diam: 2.30 cm Candee Furbish MD Electronically signed by Candee Furbish MD Signature Date/Time: 01/15/2021/3:30:21 PM    Final     Assessment & Plan:   Problem List Items Addressed This Visit     Essential hypertension    Elevated BP SBP 140 at home      History of atrial fibrillation    Will monitor for A fib      Pre-op evaluation  Per Dr  Bonney Roussel note:  "Pulmonary hypertension: I personally reviewed and independently interpreted prior EKG as an echocardiogram. Patient has moderate left atrial dilatation, grade 1 diastolic dysfunction with preserved LVEF, no right-sided dilation, moderate TR, with estimated RVSP 45 mmHg. 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  I do not anticipate this to have any prohibitive impact on his upcoming surgery. Continue ongoing management of OSA. Given his moderate left atrial dilatation, I suspect he may have had more A. fib than what is aware of.  Ideally, would like to start with 2-week cardiac telemetry.  This can be performed after upoming spine surgery. Even if he were to have atrial fibrillation, management would most likely be watchful monitoring.  His resting heart rate is in 40s and does not seem to have significant symptoms related with any possible A. fib.  Should he have A. fib, he will need anticoagulation given CHA2DS2-VASc score of at least 3.  This will be best initiated after any upcoming spine surgery.   Preop risk stratification: Given his low baseline functional capacity, I am unable to evaluate for any angina or angina equivalent symptoms.  Given that the surgery is elective, I recommend pharmacological nuclear stress test.  Unless any high risk findings identified, may be able to proceed with spine surgery with acceptable cardiac risk.   Hypertension: Blood pressure elevated today, usually well controlled.  No change made to his antihypertensive therapy today."      Pulmonary hypertension, unspecified ([place])    Per Dr Bonney Roussel note: "Pulmonary hypertension: I personally reviewed and independently interpreted prior EKG as an echocardiogram. Patient has moderate left atrial dilatation, grade 1 diastolic  dysfunction with preserved LVEF, no right-sided dilation, moderate TR, with estimated RVSP 45 mmHg. 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  I do not anticipate this to have any prohibitive impact on his upcoming surgery. Continue ongoing management of OSA. Given his moderate left atrial dilatation, I suspect he may have had more A. fib than what is aware of.  Ideally, would like to start with 2-week cardiac telemetry.  This can be performed after upoming spine surgery. Even if he were to have atrial fibrillation, management would most likely be watchful monitoring.  His resting heart rate is in 40s and does not seem to have significant symptoms related with any possible A. fib.  Should he have A. fib, he will need anticoagulation given CHA2DS2-VASc score of at least 3.  This will be best initiated after any upcoming spine surgery.   Preop risk stratification: Given his low baseline functional capacity, I am unable to evaluate for any angina or angina equivalent symptoms.  Given that the surgery is elective, I recommend pharmacological nuclear stress test.  Unless any high risk findings identified, may be able to proceed with spine surgery with acceptable cardiac risk.           No orders of the defined types were placed in this encounter.     Follow-up: No follow-ups on file.  Walker Kehr, MD

## 2021-03-07 NOTE — Progress Notes (Signed)
Surgical Instructions    Your procedure is scheduled on 03/12/21.  Report to [facility] Main Entrance "A" at 11:45 A.M., then check in with the Admitting office.  Call this number if you have problems the morning of surgery:  [phone]   If you have any questions prior to your surgery date call [phone]: Open Monday-Friday 8am-4pm    Remember:  Do not eat or drink after midnight the night before your surgery     Take these medicines the morning of surgery with A SIP OF WATER  amLODipine (NORVASC) FLUoxetine (PROZAC) gabapentin (NEURONTIN)  propranolol (INDERAL)  IF NEEDED: acetaminophen (TYLENOL)  morphine (MS CONTIN) Polyethyl Glycol-Propyl Glycol (LUBRICANT EYE DROPS) sodium chloride (OCEAN) 0.65 % SOLN nasal spray tiZANidine (ZANAFLEX)   As of today, STOP taking any Aspirin (unless otherwise instructed by your surgeon) Aleve, Naproxen, Ibuprofen, Motrin, Advil, Goody's, BC's, all herbal medications, fish oil, and all vitamins.    HOW TO MANAGE YOUR DIABETES BEFORE AND AFTER SURGERY  Why is it important to control my blood sugar before and after surgery? Improving blood sugar levels before and after surgery helps healing and can limit problems. A way of improving blood sugar control is eating a healthy diet by:  Eating less sugar and carbohydrates  Increasing activity/exercise  Talking with your doctor about reaching your blood sugar goals High blood sugars (greater than 180 mg/dL) can raise your risk of infections and slow your recovery, so you will need to focus on controlling your diabetes during the weeks before surgery. Make sure that the doctor who takes care of your diabetes knows about your planned surgery including the date and location.  How do I manage my blood sugar before surgery? Check your blood sugar at least 4 times a day, starting 2 days before surgery, to make sure that the level is not too high or low.  Check your blood sugar the morning of  your surgery when you wake up and every 2 hours until you get to the Short Stay unit.  If your blood sugar is less than 70 mg/dL, you will need to treat for low blood sugar: Do not take insulin. Treat a low blood sugar (less than 70 mg/dL) with  cup of clear juice (cranberry or apple), 4 glucose tablets, OR glucose gel. Recheck blood sugar in 15 minutes after treatment (to make sure it is greater than 70 mg/dL). If your blood sugar is not greater than 70 mg/dL on recheck, call [phone] for further instructions. Report your blood sugar to the short stay nurse when you get to Short Stay.  If you are admitted to the hospital after surgery: Your blood sugar will be checked by the staff and you will probably be given insulin after surgery (instead of oral diabetes medicines) to make sure you have good blood sugar levels. The goal for blood sugar control after surgery is 80-180 mg/dL.   After your COVID test   You are not required to quarantine however you are required to wear a well-fitting mask when you are out and around people not in your household.  If your mask becomes wet or soiled, replace with a new one.  Wash your hands often with soap and water for 20 seconds or clean your hands with an alcohol-based hand sanitizer that contains at least 60% alcohol.  Do not share personal items.  Notify your provider: if you are in close contact with someone who has COVID  or if you develop a fever of  100.4 or greater, sneezing, cough, sore throat, shortness of breath or body aches.             Do not wear jewelry or makeup Do not wear lotions, powders, perfumes/colognes, or deodorant. Men may shave face and neck. Do not bring valuables to the hospital. DO Not wear nail polish, gel polish, artificial nails, or any other type of covering on natural nails including finger and toenails. If patients have artificial nails, gel coating, etc. that need to be removed by a nail salon, please have  this removed prior to surgery or surgery may need to be canceled/delayed if the surgeon/ anesthesia feels like the patient is unable to be adequately monitored.             [facility] is not responsible for any belongings or valuables.  Do NOT Smoke (Tobacco/Vaping)  24 hours prior to your procedure  If you use a CPAP at night, you may bring your mask for your overnight stay.   Contacts, glasses, hearing aids, dentures or partials may not be worn into surgery, please bring cases for these belongings   For patients admitted to the hospital, discharge time will be determined by your treatment team.   Patients discharged the day of surgery will not be allowed to drive home, and someone needs to stay with them for 24 hours.  NO VISITORS WILL BE ALLOWED IN PRE-OP WHERE PATIENTS ARE PREPPED FOR SURGERY.  ONLY 1 SUPPORT PERSON MAY BE PRESENT IN THE WAITING ROOM WHILE YOU ARE IN SURGERY.  IF YOU ARE TO BE ADMITTED, ONCE YOU ARE IN YOUR ROOM YOU WILL BE ALLOWED TWO (2) VISITORS. 1 (ONE) VISITOR MAY STAY OVERNIGHT BUT MUST ARRIVE TO THE ROOM BY 8pm.  Minor children may have two parents present. Special consideration for safety and communication needs will be reviewed on a case by case basis.  Special instructions:    Oral Hygiene is also important to reduce your risk of infection.  Remember - BRUSH YOUR TEETH THE MORNING OF SURGERY WITH YOUR REGULAR TOOTHPASTE   [facility]- Preparing For Surgery  Before surgery, you can play an important role. Because skin is not sterile, your skin needs to be as free of germs as possible. You can reduce the number of germs on your skin by washing with CHG (chlorahexidine gluconate) Soap before surgery.  CHG is an antiseptic cleaner which kills germs and bonds with the skin to continue killing germs even after washing.     Please do not use if you have an allergy to CHG or antibacterial soaps. If your skin becomes reddened/irritated stop using the CHG.  Do not  shave (including legs and underarms) for at least 48 hours prior to first CHG shower. It is OK to shave your face.  Please follow these instructions carefully.     Shower the NIGHT BEFORE SURGERY and the MORNING OF SURGERY with CHG Soap.   If you chose to wash your hair, wash your hair first as usual with your normal shampoo. After you shampoo, rinse your hair and body thoroughly to remove the shampoo.  Then [employer] and genitals (private parts) with your normal soap and rinse thoroughly to remove soap.  After that Use CHG Soap as you would any other liquid soap. You can apply CHG directly to the skin and wash gently with a scrungie or a clean washcloth.   Apply the CHG Soap to your body ONLY FROM THE NECK DOWN.  Do not  use on open wounds or open sores. Avoid contact with your eyes, ears, mouth and genitals (private parts). Wash Face and genitals (private parts)  with your normal soap.   Wash thoroughly, paying special attention to the area where your surgery will be performed.  Thoroughly rinse your body with warm water from the neck down.  DO NOT shower/wash with your normal soap after using and rinsing off the CHG Soap.  Pat yourself dry with a CLEAN TOWEL.  Wear CLEAN PAJAMAS to bed the night before surgery  Place CLEAN SHEETS on your bed the night before your surgery  DO NOT SLEEP WITH PETS.   Day of Surgery: Take a shower with CHG soap. Wear Clean/Comfortable clothing the morning of surgery Do not apply any deodorants/lotions.   Remember to brush your teeth WITH YOUR REGULAR TOOTHPASTE.   Please read over the following fact sheets that you were given.

## 2021-03-10 ENCOUNTER — Encounter (HOSPITAL_COMMUNITY)
Admission: RE | Admit: 2021-03-10 | Discharge: 2021-03-10 | Disposition: A | Discharge status: Home / Self Care | Payer: PPO | Source: Ambulatory Visit | Attending: Orthopedic Surgery | Admitting: Orthopedic Surgery

## 2021-03-10 ENCOUNTER — Encounter (HOSPITAL_COMMUNITY): Payer: Self-pay

## 2021-03-10 ENCOUNTER — Other Ambulatory Visit: Payer: Self-pay

## 2021-03-10 VITALS — BP 153/64 | HR 58 | Temp 98.7°F | Resp 19 | Ht 72.0 in | Wt 210.0 lb

## 2021-03-10 DIAGNOSIS — Z20822 Contact with and (suspected) exposure to covid-19: Secondary | ICD-10-CM | POA: Diagnosis not present

## 2021-03-10 DIAGNOSIS — G8929 Other chronic pain: Secondary | ICD-10-CM | POA: Diagnosis not present

## 2021-03-10 DIAGNOSIS — M4807 Spinal stenosis, lumbosacral region: Secondary | ICD-10-CM | POA: Diagnosis present

## 2021-03-10 DIAGNOSIS — Z87891 Personal history of nicotine dependence: Secondary | ICD-10-CM | POA: Diagnosis not present

## 2021-03-10 DIAGNOSIS — F32A Depression, unspecified: Secondary | ICD-10-CM | POA: Diagnosis not present

## 2021-03-10 DIAGNOSIS — N400 Enlarged prostate without lower urinary tract symptoms: Secondary | ICD-10-CM | POA: Diagnosis present

## 2021-03-10 DIAGNOSIS — Z01818 Encounter for other preprocedural examination: Secondary | ICD-10-CM | POA: Insufficient documentation

## 2021-03-10 DIAGNOSIS — M2578 Osteophyte, vertebrae: Secondary | ICD-10-CM | POA: Diagnosis present

## 2021-03-10 DIAGNOSIS — I272 Pulmonary hypertension, unspecified: Secondary | ICD-10-CM | POA: Diagnosis not present

## 2021-03-10 DIAGNOSIS — M4186 Other forms of scoliosis, lumbar region: Secondary | ICD-10-CM | POA: Diagnosis not present

## 2021-03-10 DIAGNOSIS — Z96643 Presence of artificial hip joint, bilateral: Secondary | ICD-10-CM | POA: Diagnosis present

## 2021-03-10 DIAGNOSIS — M5136 Other intervertebral disc degeneration, lumbar region: Secondary | ICD-10-CM | POA: Diagnosis not present

## 2021-03-10 DIAGNOSIS — E119 Type 2 diabetes mellitus without complications: Secondary | ICD-10-CM | POA: Diagnosis not present

## 2021-03-10 DIAGNOSIS — M48062 Spinal stenosis, lumbar region with neurogenic claudication: Secondary | ICD-10-CM | POA: Diagnosis not present

## 2021-03-10 DIAGNOSIS — M4316 Spondylolisthesis, lumbar region: Secondary | ICD-10-CM | POA: Diagnosis present

## 2021-03-10 DIAGNOSIS — G4733 Obstructive sleep apnea (adult) (pediatric): Secondary | ICD-10-CM | POA: Diagnosis not present

## 2021-03-10 DIAGNOSIS — I2722 Pulmonary hypertension due to left heart disease: Secondary | ICD-10-CM | POA: Diagnosis not present

## 2021-03-10 DIAGNOSIS — R001 Bradycardia, unspecified: Secondary | ICD-10-CM | POA: Diagnosis present

## 2021-03-10 DIAGNOSIS — E538 Deficiency of other specified B group vitamins: Secondary | ICD-10-CM | POA: Diagnosis present

## 2021-03-10 DIAGNOSIS — M48061 Spinal stenosis, lumbar region without neurogenic claudication: Secondary | ICD-10-CM | POA: Diagnosis not present

## 2021-03-10 DIAGNOSIS — M5116 Intervertebral disc disorders with radiculopathy, lumbar region: Secondary | ICD-10-CM | POA: Diagnosis not present

## 2021-03-10 DIAGNOSIS — I10 Essential (primary) hypertension: Secondary | ICD-10-CM | POA: Diagnosis not present

## 2021-03-10 DIAGNOSIS — M4156 Other secondary scoliosis, lumbar region: Secondary | ICD-10-CM | POA: Diagnosis not present

## 2021-03-10 DIAGNOSIS — I4891 Unspecified atrial fibrillation: Secondary | ICD-10-CM | POA: Diagnosis not present

## 2021-03-10 DIAGNOSIS — F411 Generalized anxiety disorder: Secondary | ICD-10-CM | POA: Diagnosis present

## 2021-03-10 DIAGNOSIS — I480 Paroxysmal atrial fibrillation: Secondary | ICD-10-CM | POA: Diagnosis not present

## 2021-03-10 DIAGNOSIS — G8918 Other acute postprocedural pain: Secondary | ICD-10-CM | POA: Diagnosis not present

## 2021-03-10 DIAGNOSIS — Z981 Arthrodesis status: Secondary | ICD-10-CM | POA: Diagnosis not present

## 2021-03-10 DIAGNOSIS — Z9989 Dependence on other enabling machines and devices: Secondary | ICD-10-CM | POA: Diagnosis not present

## 2021-03-10 DIAGNOSIS — E559 Vitamin D deficiency, unspecified: Secondary | ICD-10-CM | POA: Diagnosis not present

## 2021-03-10 DIAGNOSIS — Z789 Other specified health status: Secondary | ICD-10-CM

## 2021-03-10 DIAGNOSIS — K5900 Constipation, unspecified: Secondary | ICD-10-CM | POA: Diagnosis not present

## 2021-03-10 DIAGNOSIS — M4326 Fusion of spine, lumbar region: Secondary | ICD-10-CM | POA: Diagnosis not present

## 2021-03-10 DIAGNOSIS — M961 Postlaminectomy syndrome, not elsewhere classified: Secondary | ICD-10-CM | POA: Diagnosis not present

## 2021-03-10 DIAGNOSIS — F1090 Alcohol use, unspecified, uncomplicated: Secondary | ICD-10-CM

## 2021-03-10 DIAGNOSIS — M4726 Other spondylosis with radiculopathy, lumbar region: Secondary | ICD-10-CM | POA: Diagnosis not present

## 2021-03-10 DIAGNOSIS — E785 Hyperlipidemia, unspecified: Secondary | ICD-10-CM | POA: Diagnosis present

## 2021-03-10 DIAGNOSIS — M21372 Foot drop, left foot: Secondary | ICD-10-CM | POA: Diagnosis present

## 2021-03-10 LAB — BASIC METABOLIC PANEL
Anion gap: 7 (ref 5–15)
BUN: 20 mg/dL (ref 8–23)
CO2: 26 mmol/L (ref 22–32)
Calcium: 9.1 mg/dL (ref 8.9–10.3)
Chloride: 104 mmol/L (ref 98–111)
Creatinine, Ser: 0.96 mg/dL (ref 0.61–1.24)
GFR, Estimated: >60 mL/min (ref >60)
Glucose, Bld: 93 mg/dL (ref 70–99)
Potassium: 4 mmol/L (ref 3.5–5.1)
Sodium: 137 mmol/L (ref 135–145)

## 2021-03-10 LAB — CBC
HCT: 41.5 % (ref 39.0–52.0)
Hemoglobin: 14.1 g/dL (ref 13.0–17.0)
MCH: 30.8 pg (ref 26.0–34.0)
MCHC: 34 g/dL (ref 30.0–36.0)
MCV: 90.6 fL (ref 80.0–100.0)
Platelets: 211 10*3/uL (ref 150–400)
RBC: 4.58 MIL/uL (ref 4.22–5.81)
RDW: 13.2 % (ref 11.5–15.5)
WBC: 7.9 10*3/uL (ref 4.0–10.5)
nRBC: 0 % (ref 0.0–0.2)

## 2021-03-10 LAB — SURGICAL PCR SCREEN
MRSA, PCR: NEGATIVE
Staphylococcus aureus: POSITIVE — AB

## 2021-03-10 LAB — URINALYSIS, ROUTINE W REFLEX MICROSCOPIC
Bilirubin Urine: NEGATIVE
Glucose, UA: NEGATIVE mg/dL
Hgb urine dipstick: NEGATIVE
Ketones, ur: NEGATIVE mg/dL
Leukocytes,Ua: NEGATIVE
Nitrite: NEGATIVE
Protein, ur: NEGATIVE mg/dL
Specific Gravity, Urine: 1.015 (ref 1.005–1.030)
pH: 6 (ref 5.0–8.0)

## 2021-03-10 LAB — TYPE AND SCREEN
ABO/RH(D): O NEG
Antibody Screen: NEGATIVE

## 2021-03-10 LAB — SARS CORONAVIRUS 2 (TAT 6-24 HRS): SARS Coronavirus 2: NEGATIVE

## 2021-03-10 NOTE — Progress Notes (Signed)
PCP - Tyrone Apple plotnikov Cardiologist - Manish Patwardhan and peter nishan Pulmonology: Kara Mead  PPM/ICD - denies   Chest x-ray - n/a EKG - 01/22/21 Stress Test - 01/29/21 ECHO - 01/15/21 Cardiac Cath - over 10 years ago, patient states it was normal  Sleep Study - wears cpap nightly, settings 12   No diabetes  Patient instructed to hold all Aspirin, NSAID's, herbal medications, fish oil and vitamins 7 days prior to surgery.   ERAS Protcol -no   COVID TEST- 03/19/21 in PAT   Anesthesia review: yes, per order  Patient denies shortness of breath, fever, cough and chest pain at PAT appointment   All instructions explained to the patient, with a verbal understanding of the material. Patient agrees to go over the instructions while at home for a better understanding. Patient also instructed to self quarantine after being tested for COVID-19. The opportunity to ask questions was provided.

## 2021-03-11 ENCOUNTER — Encounter (HOSPITAL_COMMUNITY): Payer: Self-pay

## 2021-03-11 NOTE — Progress Notes (Signed)
Anesthesia Chart Review:  Case: [id number] Date/Time: 03/12/21 1330   Procedures:      OBLIQUE LUMBAR INTERBODY FUSION 1 LEVEL (OLIF L4-5, XLIF L2-4) - 5 hrs Left Tap Block with exparel 3 C-Bed     ANTERIOR LATERAL LUMBAR FUSION 2 LEVELS     ABDOMINAL EXPOSURE   Anesthesia type: General   Pre-op diagnosis: Degenerative scoliosis with degenerative slip L4-5, spinal stenosis with neuropathic leg pain   Location: [facility] OR ROOM 04 / [place] [facility]   Surgeons: Melina Schools, MD; Marty Heck, MD       DISCUSSION: Patient is an 80 year old male scheduled for the above procedure.  He is then scheduled for L2-5 posterior lumber fusion, possible decompression by Dr. Rolena Infante on 03/13/2021. These surgeries were initially scheduled for end of October, but delayed for further cardiac testing. See below.   History includes former smoker (quit 03/31/71), HTN, HLD, afib (1992), MVP (2005 echo; normal MV structure with trivial MR 01/15/21 echo), palpitations, bradycardia, PTSD, OSA (uses CPAP), edema, spinal surgery (C3-7 ACDF 06/28/02), THA (right 03/14/13, left 05/23/19), BPH, pill dysphagia (occasionally). DM2 and seizure history are listed, but he denied. A1c 4.9% 01/14/21 (A1c 5.5% or less since 06/2008, so if a true diagnosis, it may have been > 12 years ago.) Seizure history was added by a LPN in 6295, but not noted in neurology notes that same year, so will remove from history given he also denied this history,     Per 05/05/2012 cardiology note by Lauree Chandler, MD, "He notes having an episode of atrial fibrillation in 1992 and was started on Digoxin. No recurrence since then. Echo in 2005 with MVP, mild MR. Cardiac cath 2005 per Dr. Johnsie Cancel with no evidence of CAD." Following 01/06/21 preoperative evaluation visit, PCP [name], [name], MD ordered an echo which was done on 01/15/21 and showed normal LVEF, grade 1 diastolic dysfunction, moderately elevated pulmonary artery systolic pressure, ~ RVSP 47.2  mmHg, trivial MR.   Dr. Alain Marion referred Mr. Pecore to Dr. Virgina Jock for preoperative cardiology evaluation. He was initially seen on 01/22/21. Given echo findings and longstanding history of OSA and remote afib, he thought Mr. Greenen likely had pulmonary hypertension WHO group 2, and wrote" I do not anticipate this to have any prohibitive impact on his upcoming surgery." Given moderate LA dilatation, he suspected patient may be having episodes of breakthrough afib, so recommended a 2 week cardiac monitor to assess for afib burden, but felt this could be done after spina surgery. He added that "Even if he were to have atrial fibrillation, management would most likely be watchful monitoring.  His resting heart rate is in 40s and does not seem to have significant symptoms related with any possible A. fib.  Should he have A. fib, he will need anticoagulation given CHA2DS2-VASc score of at least 3.  This will be best initiated after any upcoming spine surgery." Lastly, given low baseline functional capacity, he recommended a preoperative nuclear stress test. This was done on 01/31/21 and classified as low risk, and thus felt "Okay to proceed."  03/10/2021 presurgical COVID-19 test negative.  Anesthesia team to evaluate on the day of surgery.  VS: BP (!) 153/64    Pulse (!) 58    Temp 37.1 C (Oral)    Resp 19    Ht 6' (1.829 m)    Wt 95.3 kg    SpO2 96%    BMI 28.48 kg/m    PROVIDERS: [name], [name], MD is PCP  Kara Mead, MD is pulmonologist. Last visit 10/08/20 for OSA. Vernell Leep, MD is cardiologist. (Previously saw Lauree Chandler, MD in 2014.) Narda Amber, DO is neurologist. 05/05/19 note for generalized weakness and left hand atrophy.  Left hand atrophy felt due to chronic ulnar neuropathy at the elbow.  Some proximal arm weakness felt due to either cervical radiculopathy versus arthritic.  Lower extremity weakness felt due to degenerative lumbar disease. There was no mention  of seizure history.  LABS: Labs reviewed: Acceptable for surgery. A1c 4.9% and PT/PTT normal on 01/14/21.  (all labs ordered are listed, but only abnormal results are displayed)  Labs Reviewed  SURGICAL PCR SCREEN - Abnormal; Notable for the following components:      Result Value   Staphylococcus aureus POSITIVE (*)    All other components within normal limits  SARS CORONAVIRUS 2 (TAT 6-24 HRS)  CBC  BASIC METABOLIC PANEL  URINALYSIS, ROUTINE W REFLEX MICROSCOPIC  TYPE AND SCREEN    IMAGES: CXR 01/10/21: FINDINGS: Heart is mildly enlarged. Slight increased diffuse interstitial opacities at the bases suggest mild edema. No significant effusions are present. Calcified granuloma again noted in the left upper lobe. Lungs are otherwise clear. Calcified mediastinal lymph nodes are again seen. IMPRESSION: Mild cardiomegaly and mild bibasilar interstitial edema.   MRI L-spine 10/23/20: IMPRESSION: 1. At L3-L4, severe canal and left subarticular recess stenosis with moderate to severe left foraminal stenosis. 2. At L2-L3, severe right subarticular recess stenosis, moderate canal stenosis, and mild-to-moderate right foraminal stenosis with right far lateral/extraforaminal disc closely approximating the exiting/exited right L2 nerve. 3. At L4-L5, grade 1 anterolisthesis with severe bilateral subarticular recess stenosis, mild to moderate canal stenosis, and moderate left foraminal stenosis. 4. At L5-S1, moderate left foraminal stenosis with left far lateral osteophyte contacting the exiting left L5 nerve. 5. At L1-L2, moderate right subarticular and foraminal stenosis with mild to moderate canal stenosis.    EKG:  EKG 01/22/21: Sinus bradycardia 48 bpm  EKG 01/06/21: Sinus bradycardia at 45 bpm.  Nonspecific ST abnormality.     CV: Lexiscan Tetrofosmin stress test 01/31/2021: Lexiscan nuclear stress test performed using 1-day protocol. SPECT images show decreased tracer  uptake in inferior myocardium, more prominent at rest. In absence of myocardial thickening or wall motion abnormality, this likely represents attenuation artifact. Stress LVEF 52%. Low risk study.   Echo 01/15/2021: IMPRESSIONS   1. Left ventricular ejection fraction, by estimation, is 55 to 60%. The  left ventricle has normal function. The left ventricle has no regional  wall motion abnormalities. Left ventricular diastolic parameters are  consistent with Grade I diastolic  dysfunction (impaired relaxation).   2. Right ventricular systolic function is normal. The right ventricular  size is normal. There is moderately elevated pulmonary artery systolic  pressure. The estimated right ventricular systolic pressure is 78.5 mmHg.   3. Left atrial size was moderately dilated.   4. The mitral valve is normal in structure. Trivial mitral valve  regurgitation. No evidence of mitral stenosis.   5. The aortic valve is normal in structure. Aortic valve regurgitation is  trivial. No aortic stenosis is present.   6. Aortic dilatation noted. There is mild dilatation of the ascending  aorta, measuring 40 mm.   7. The inferior vena cava is normal in size with greater than 50%  respiratory variability, suggesting right atrial pressure of 3 mmHg.  - Comparison(s): Changes from prior study are noted. 05/09/12 EF 55-60%. PA pressure 40mmHg.    Cardiac cath 01/23/2004: Normal  coronary arteries. Normal LV, EF 60%. No gradient across the AV and no MR. No aortic dissection.    Past Medical History:  Diagnosis Date   Arthralgia    Atrial fibrillation ([place])    1992   Benign prostatic hyperplasia    Bradycardia    "my doctor says I have bradycardia, my heart rate is always in the 40s"   DDD (degenerative disc disease)    Depression    Diabetes mellitus    type II  - PT STATES NOT ON ANY DIABETIC MEDICINES   Dysrhythmia    aFIB   Dyssomnia    ED (erectile dysfunction)    Hyperlipidemia     Hypertension    Hypogonadism male    Joint pain    LBP (low back pain)    Multiple actinic keratoses    Muscle weakness    Upper limb   MVP (mitral valve prolapse)    Neck pain    OSA on CPAP    cpap   Osteoarthritis of hip    Osteoarthritis, hand    SEVERE LOWER BACK PAIN, AND RESTLESS LEG SYNDROME   Otalgia of right ear    Pain in left knee    Pain in thoracic spine    Palpitations    PTSD (post-traumatic stress disorder)    Shoulder pain    Subjective visual disturbance    Swallowing problem    HX OF PILLS GETTING "STUCK" IN THROAT AT TIMES   Swelling    Type 2 diabetes mellitus ([place])    pt states he has never had it   Vitamin D deficiency     Past Surgical History:  Procedure Laterality Date   APPENDECTOMY     BACK SURGERY  1987   CATARACT EXTRACTION     CERVICAL LAMINECTOMY  2004   [name], rod was placed- HAS ROM LIMITATIONS   CHOLECYSTECTOMY     COLONOSCOPY     JOINT REPLACEMENT     KNEE SURGERY     left knee/arthroscopic   TOTAL HIP ARTHROPLASTY Right 03/14/2013   Procedure: RIGHT TOTAL HIP ARTHROPLASTY ANTERIOR APPROACH;  Surgeon: [name], MD;  Location: [facility] ORS;  Service: Orthopedics;  Laterality: Right;   TOTAL HIP ARTHROPLASTY Left 05/23/2019   Procedure: TOTAL HIP ARTHROPLASTY ANTERIOR APPROACH;  Surgeon: Paralee Cancel, MD;  Location: [facility] ORS;  Service: Orthopedics;  Laterality: Left;  70 mins    MEDICATIONS:  acetaminophen (TYLENOL) 500 MG tablet   amLODipine (NORVASC) 5 MG tablet   Cholecalciferol (VITAMIN D3) 1000 UNIT tablet   ciclopirox (PENLAC) 8 % solution   clotrimazole-betamethasone (LOTRISONE) cream   Cyanocobalamin (VITAMIN B-12 CR) 1000 MCG TBCR   FLUoxetine (PROZAC) 40 MG capsule   furosemide (LASIX) 20 MG tablet   gabapentin (NEURONTIN) 300 MG capsule   morphine (MS CONTIN) 30 MG 12 hr tablet   Polyethyl Glycol-Propyl Glycol (LUBRICANT EYE DROPS) 0.4-0.3 % SOLN   polyethylene glycol (MIRALAX / GLYCOLAX) 17 g packet    propranolol (INDERAL) 80 MG tablet   sodium chloride (OCEAN) 0.65 % SOLN nasal spray   testosterone cypionate (DEPOTESTOSTERONE CYPIONATE) 200 MG/ML injection   tiZANidine (ZANAFLEX) 4 MG tablet   traZODone (DESYREL) 50 MG tablet   Zinc 30 MG TABS   No current facility-administered medications for this encounter.    Myra Gianotti, PA-C Surgical Short Stay/Anesthesiology [facility] Phone [phone] [facility] Phone [phone] 03/11/2021 11:41 AM

## 2021-03-11 NOTE — Anesthesia Preprocedure Evaluation (Addendum)
Anesthesia Evaluation  Patient identified by MRN, date of birth, ID band Patient awake    Reviewed: Allergy & Precautions, NPO status , Patient's Chart, lab work & pertinent test results  Airway Mallampati: III  TM Distance: <3 FB Neck ROM: Full    Dental  (+) Teeth Intact, Dental Advisory Given   Pulmonary sleep apnea and Continuous Positive Airway Pressure Ventilation , former smoker,    Pulmonary exam normal breath sounds clear to auscultation       Cardiovascular hypertension, Normal cardiovascular exam+ dysrhythmias Atrial Fibrillation + Valvular Problems/Murmurs MVP  Rhythm:Regular Rate:Normal  Lexiscan Tetrofosmin stress test 01/31/2021: Lexiscan nuclear stress test performed using 1-day protocol. SPECT images show decreased tracer uptake in inferior myocardium, more prominent at rest. In absence of myocardial thickening or wall motion abnormality, this likely represents attenuation artifact. Stress LVEF 52%. Low risk study.   Echo 01/15/2021: IMPRESSIONS  1. Left ventricular ejection fraction, by estimation, is 55 to 60%. The  left ventricle has normal function. The left ventricle has no regional  wall motion abnormalities. Left ventricular diastolic parameters are  consistent with Grade I diastolic  dysfunction (impaired relaxation).  2. Right ventricular systolic function is normal. The right ventricular  size is normal. There is moderately elevated pulmonary artery systolic  pressure. The estimated right ventricular systolic pressure is 07.8 mmHg.  3. Left atrial size was moderately dilated.  4. The mitral valve is normal in structure. Trivial mitral valve  regurgitation. No evidence of mitral stenosis.  5. The aortic valve is normal in structure. Aortic valve regurgitation is  trivial. No aortic stenosis is present.  6. Aortic dilatation noted. There is mild dilatation of the ascending  aorta, measuring 40  mm.  7. The inferior vena cava is normal in size with greater than 50%  respiratory variability, suggesting right atrial pressure of 3 mmHg.  -Comparison(s): Changes from prior study are noted. 05/09/12 EF 55-60%. PA pressure 85mmHg.      Neuro/Psych Seizures -,  PSYCHIATRIC DISORDERS Anxiety Depression  Neuromuscular disease    GI/Hepatic negative GI ROS, Neg liver ROS,   Endo/Other  diabetes, Type 2, Oral Hypoglycemic Agents  Renal/GU negative Renal ROS     Musculoskeletal negative musculoskeletal ROS (+)   Abdominal   Peds  Hematology negative hematology ROS (+)   Anesthesia Other Findings Day of surgery medications reviewed with the patient.  Reproductive/Obstetrics                            Anesthesia Physical Anesthesia Plan  ASA: 3  Anesthesia Plan: General   Post-op Pain Management:    Induction: Intravenous  PONV Risk Score and Plan: 3 and Dexamethasone, Ondansetron and TIVA  Airway Management Planned: Oral ETT and Video Laryngoscope Planned  Additional Equipment:   Intra-op Plan:   Post-operative Plan: Extubation in OR  Informed Consent: I have reviewed the patients History and Physical, chart, labs and discussed the procedure including the risks, benefits and alternatives for the proposed anesthesia with the patient or authorized representative who has indicated his/her understanding and acceptance.     Dental advisory given  Plan Discussed with: CRNA  Anesthesia Plan Comments: (PAT note written 03/11/2021 by Myra Gianotti, PA-C.  CLEAR SIGHT, 2nd PIV.)       Anesthesia Quick Evaluation

## 2021-03-12 ENCOUNTER — Inpatient Hospital Stay (HOSPITAL_COMMUNITY): Payer: PPO

## 2021-03-12 ENCOUNTER — Inpatient Hospital Stay (HOSPITAL_COMMUNITY): Payer: PPO | Admitting: Anesthesiology

## 2021-03-12 ENCOUNTER — Inpatient Hospital Stay (HOSPITAL_COMMUNITY): Payer: PPO | Admitting: Vascular Surgery

## 2021-03-12 ENCOUNTER — Other Ambulatory Visit: Payer: Self-pay

## 2021-03-12 ENCOUNTER — Encounter (HOSPITAL_COMMUNITY)
Admission: RE | Disposition: A | Discharge status: Home / Self Care | Payer: Self-pay | Source: Home / Self Care | Attending: Orthopedic Surgery

## 2021-03-12 ENCOUNTER — Encounter (HOSPITAL_COMMUNITY): Payer: Self-pay | Admitting: Orthopedic Surgery

## 2021-03-12 ENCOUNTER — Inpatient Hospital Stay (HOSPITAL_COMMUNITY)
Admission: RE | Admit: 2021-03-12 | Discharge: 2021-03-15 | DRG: 458 | Disposition: A | Discharge status: Home / Self Care | Payer: PPO | Attending: Orthopedic Surgery | Admitting: Orthopedic Surgery

## 2021-03-12 DIAGNOSIS — M2578 Osteophyte, vertebrae: Secondary | ICD-10-CM | POA: Diagnosis present

## 2021-03-12 DIAGNOSIS — I480 Paroxysmal atrial fibrillation: Secondary | ICD-10-CM | POA: Diagnosis not present

## 2021-03-12 DIAGNOSIS — M21372 Foot drop, left foot: Secondary | ICD-10-CM | POA: Diagnosis present

## 2021-03-12 DIAGNOSIS — G8929 Other chronic pain: Secondary | ICD-10-CM | POA: Diagnosis present

## 2021-03-12 DIAGNOSIS — F32A Depression, unspecified: Secondary | ICD-10-CM | POA: Diagnosis present

## 2021-03-12 DIAGNOSIS — E538 Deficiency of other specified B group vitamins: Secondary | ICD-10-CM | POA: Diagnosis present

## 2021-03-12 DIAGNOSIS — E119 Type 2 diabetes mellitus without complications: Secondary | ICD-10-CM | POA: Diagnosis present

## 2021-03-12 DIAGNOSIS — M4316 Spondylolisthesis, lumbar region: Secondary | ICD-10-CM | POA: Diagnosis present

## 2021-03-12 DIAGNOSIS — K5900 Constipation, unspecified: Secondary | ICD-10-CM | POA: Diagnosis not present

## 2021-03-12 DIAGNOSIS — Z96643 Presence of artificial hip joint, bilateral: Secondary | ICD-10-CM | POA: Diagnosis present

## 2021-03-12 DIAGNOSIS — G4733 Obstructive sleep apnea (adult) (pediatric): Secondary | ICD-10-CM | POA: Diagnosis not present

## 2021-03-12 DIAGNOSIS — I272 Pulmonary hypertension, unspecified: Secondary | ICD-10-CM | POA: Diagnosis not present

## 2021-03-12 DIAGNOSIS — Z87891 Personal history of nicotine dependence: Secondary | ICD-10-CM | POA: Diagnosis not present

## 2021-03-12 DIAGNOSIS — F411 Generalized anxiety disorder: Secondary | ICD-10-CM | POA: Diagnosis present

## 2021-03-12 DIAGNOSIS — M4807 Spinal stenosis, lumbosacral region: Secondary | ICD-10-CM | POA: Diagnosis present

## 2021-03-12 DIAGNOSIS — M5136 Other intervertebral disc degeneration, lumbar region: Secondary | ICD-10-CM | POA: Diagnosis not present

## 2021-03-12 DIAGNOSIS — Z888 Allergy status to other drugs, medicaments and biological substances status: Secondary | ICD-10-CM

## 2021-03-12 DIAGNOSIS — Z20822 Contact with and (suspected) exposure to covid-19: Secondary | ICD-10-CM | POA: Diagnosis present

## 2021-03-12 DIAGNOSIS — I4891 Unspecified atrial fibrillation: Secondary | ICD-10-CM | POA: Diagnosis present

## 2021-03-12 DIAGNOSIS — N400 Enlarged prostate without lower urinary tract symptoms: Secondary | ICD-10-CM | POA: Diagnosis present

## 2021-03-12 DIAGNOSIS — Z9989 Dependence on other enabling machines and devices: Secondary | ICD-10-CM | POA: Diagnosis not present

## 2021-03-12 DIAGNOSIS — Z833 Family history of diabetes mellitus: Secondary | ICD-10-CM

## 2021-03-12 DIAGNOSIS — R001 Bradycardia, unspecified: Secondary | ICD-10-CM | POA: Diagnosis not present

## 2021-03-12 DIAGNOSIS — Z419 Encounter for procedure for purposes other than remedying health state, unspecified: Secondary | ICD-10-CM

## 2021-03-12 DIAGNOSIS — Z981 Arthrodesis status: Secondary | ICD-10-CM | POA: Diagnosis not present

## 2021-03-12 DIAGNOSIS — G8918 Other acute postprocedural pain: Secondary | ICD-10-CM | POA: Diagnosis not present

## 2021-03-12 DIAGNOSIS — M48062 Spinal stenosis, lumbar region with neurogenic claudication: Secondary | ICD-10-CM | POA: Diagnosis present

## 2021-03-12 DIAGNOSIS — E785 Hyperlipidemia, unspecified: Secondary | ICD-10-CM | POA: Diagnosis present

## 2021-03-12 DIAGNOSIS — M961 Postlaminectomy syndrome, not elsewhere classified: Secondary | ICD-10-CM | POA: Diagnosis not present

## 2021-03-12 DIAGNOSIS — M4156 Other secondary scoliosis, lumbar region: Secondary | ICD-10-CM | POA: Diagnosis present

## 2021-03-12 DIAGNOSIS — I2722 Pulmonary hypertension due to left heart disease: Secondary | ICD-10-CM | POA: Diagnosis present

## 2021-03-12 DIAGNOSIS — Z9049 Acquired absence of other specified parts of digestive tract: Secondary | ICD-10-CM

## 2021-03-12 DIAGNOSIS — M4326 Fusion of spine, lumbar region: Secondary | ICD-10-CM | POA: Diagnosis not present

## 2021-03-12 DIAGNOSIS — Z8249 Family history of ischemic heart disease and other diseases of the circulatory system: Secondary | ICD-10-CM

## 2021-03-12 DIAGNOSIS — Z800 Family history of malignant neoplasm of digestive organs: Secondary | ICD-10-CM

## 2021-03-12 DIAGNOSIS — I10 Essential (primary) hypertension: Secondary | ICD-10-CM | POA: Diagnosis not present

## 2021-03-12 DIAGNOSIS — Z823 Family history of stroke: Secondary | ICD-10-CM

## 2021-03-12 DIAGNOSIS — Z79899 Other long term (current) drug therapy: Secondary | ICD-10-CM

## 2021-03-12 HISTORY — PX: ABDOMINAL EXPOSURE: SHX5708

## 2021-03-12 HISTORY — PX: ANTERIOR LAT LUMBAR FUSION: SHX1168

## 2021-03-12 LAB — GLUCOSE, CAPILLARY
Glucose-Capillary: 99 mg/dL (ref 70–99)
Glucose-Capillary: 104 mg/dL — ABNORMAL HIGH (ref 70–99)

## 2021-03-12 SURGERY — OBLIQUE LUMBAR INTERBODY FUSION 1 LEVEL
Anesthesia: General

## 2021-03-12 MED ORDER — ACETAMINOPHEN 500 MG PO TABS: 1000.0000 mg | ORAL_TABLET | Freq: Once | ORAL | Status: DC | PRN

## 2021-03-12 MED ORDER — ACETAMINOPHEN 160 MG/5ML PO SOLN: 1000.0000 mg | Freq: Once | ORAL | Status: DC | PRN

## 2021-03-12 MED ORDER — CHLORHEXIDINE GLUCONATE CLOTH 2 % EX PADS: 6.0000 | MEDICATED_PAD | Freq: Once | CUTANEOUS | Status: DC

## 2021-03-12 MED ORDER — ACETAMINOPHEN 10 MG/ML IV SOLN: 1000.0000 mg | Freq: Once | INTRAVENOUS | Status: DC | PRN

## 2021-03-12 MED ORDER — ACETAMINOPHEN 500 MG PO TABS
1000.0000 mg | ORAL_TABLET | Freq: Once | ORAL | Status: AC
  Administered 2021-03-12: 12:00:00 1000 mg via ORAL
  Filled 2021-03-12: qty 2

## 2021-03-12 MED ORDER — LACTATED RINGERS IV SOLN: INTRAVENOUS | Status: DC

## 2021-03-12 MED ORDER — SODIUM CHLORIDE 0.9 % IV SOLN: 250.0000 mL | INTRAVENOUS | Status: DC

## 2021-03-12 MED ORDER — ONDANSETRON HCL 4 MG/2ML IJ SOLN: 4.0000 mg | Freq: Four times a day (QID) | INTRAMUSCULAR | Status: DC | PRN

## 2021-03-12 MED ORDER — SODIUM CHLORIDE 0.9% FLUSH: 3.0000 mL | INTRAVENOUS | Status: DC | PRN

## 2021-03-12 MED ORDER — ORAL CARE MOUTH RINSE: 15.0000 mL | Freq: Once | OROMUCOSAL | Status: AC

## 2021-03-12 MED ORDER — ACETAMINOPHEN 500 MG PO TABS: 1000.0000 mg | ORAL_TABLET | Freq: Three times a day (TID) | ORAL | Status: DC | PRN

## 2021-03-12 MED ORDER — FLEET ENEMA 7-19 GM/118ML RE ENEM: 1.0000 | ENEMA | Freq: Once | RECTAL | Status: DC | PRN

## 2021-03-12 MED ORDER — OXYCODONE HCL 5 MG PO TABS: 5.0000 mg | ORAL_TABLET | Freq: Once | ORAL | Status: DC | PRN

## 2021-03-12 MED ORDER — PROPRANOLOL HCL 80 MG PO TABS
80.0000 mg | ORAL_TABLET | Freq: Three times a day (TID) | ORAL | Status: DC
  Administered 2021-03-12 – 2021-03-14 (×7): 80 mg via ORAL
  Filled 2021-03-12 (×12): qty 1

## 2021-03-12 MED ORDER — BUPIVACAINE-EPINEPHRINE (PF) 0.25% -1:200000 IJ SOLN
INTRAMUSCULAR | Status: AC
  Filled 2021-03-12: qty 30

## 2021-03-12 MED ORDER — LIDOCAINE 2% (20 MG/ML) 5 ML SYRINGE
INTRAMUSCULAR | Status: DC | PRN
  Administered 2021-03-12 (×2): 100 mg via INTRAVENOUS

## 2021-03-12 MED ORDER — BUPIVACAINE-EPINEPHRINE 0.25% -1:200000 IJ SOLN
INTRAMUSCULAR | Status: DC | PRN
  Administered 2021-03-12: 10 mL

## 2021-03-12 MED ORDER — AMLODIPINE BESYLATE 5 MG PO TABS
5.0000 mg | ORAL_TABLET | Freq: Every day | ORAL | Status: DC
  Administered 2021-03-13 – 2021-03-14 (×2): 5 mg via ORAL
  Filled 2021-03-12 (×2): qty 1

## 2021-03-12 MED ORDER — SODIUM CHLORIDE 0.9% FLUSH
3.0000 mL | Freq: Two times a day (BID) | INTRAVENOUS | Status: DC
  Administered 2021-03-12: 22:00:00 3 mL via INTRAVENOUS

## 2021-03-12 MED ORDER — KETAMINE HCL 50 MG/5ML IJ SOSY
PREFILLED_SYRINGE | INTRAMUSCULAR | Status: AC
  Filled 2021-03-12: qty 5

## 2021-03-12 MED ORDER — FENTANYL CITRATE (PF) 100 MCG/2ML IJ SOLN: 25.0000 ug | INTRAMUSCULAR | Status: DC | PRN

## 2021-03-12 MED ORDER — METHOCARBAMOL 1000 MG/10ML IJ SOLN
500.0000 mg | Freq: Four times a day (QID) | INTRAVENOUS | Status: DC | PRN
  Filled 2021-03-12: qty 5

## 2021-03-12 MED ORDER — 0.9 % SODIUM CHLORIDE (POUR BTL) OPTIME
TOPICAL | Status: DC | PRN
  Administered 2021-03-12 (×2): 1000 mL

## 2021-03-12 MED ORDER — FENTANYL CITRATE (PF) 250 MCG/5ML IJ SOLN
INTRAMUSCULAR | Status: DC | PRN
  Administered 2021-03-12 (×3): 50 ug via INTRAVENOUS

## 2021-03-12 MED ORDER — ONDANSETRON HCL 4 MG PO TABS: 4.0000 mg | ORAL_TABLET | Freq: Four times a day (QID) | ORAL | Status: DC | PRN

## 2021-03-12 MED ORDER — FENTANYL CITRATE (PF) 100 MCG/2ML IJ SOLN
INTRAMUSCULAR | Status: AC
  Administered 2021-03-12: 12:00:00 50 ug via INTRAVENOUS
  Filled 2021-03-12: qty 2

## 2021-03-12 MED ORDER — ROCURONIUM BROMIDE 10 MG/ML (PF) SYRINGE
PREFILLED_SYRINGE | INTRAVENOUS | Status: DC | PRN
  Administered 2021-03-12: 10 mg via INTRAVENOUS
  Administered 2021-03-12: 50 mg via INTRAVENOUS
  Administered 2021-03-12 (×2): 20 mg via INTRAVENOUS

## 2021-03-12 MED ORDER — OXYCODONE HCL 5 MG/5ML PO SOLN: 5.0000 mg | Freq: Once | ORAL | Status: DC | PRN

## 2021-03-12 MED ORDER — OXYCODONE HCL 5 MG PO TABS
5.0000 mg | ORAL_TABLET | ORAL | Status: DC | PRN
  Administered 2021-03-12 – 2021-03-13 (×2): 5 mg via ORAL
  Filled 2021-03-12 (×2): qty 1

## 2021-03-12 MED ORDER — HYDROMORPHONE HCL 1 MG/ML IJ SOLN
0.2500 mg | INTRAMUSCULAR | Status: DC | PRN
  Administered 2021-03-12: 19:00:00 0.5 mg via INTRAVENOUS

## 2021-03-12 MED ORDER — MENTHOL 3 MG MT LOZG: 1.0000 | LOZENGE | OROMUCOSAL | Status: DC | PRN

## 2021-03-12 MED ORDER — PROPOFOL 500 MG/50ML IV EMUL
INTRAVENOUS | Status: DC | PRN
  Administered 2021-03-12: 75 ug/kg/min via INTRAVENOUS

## 2021-03-12 MED ORDER — SUGAMMADEX SODIUM 200 MG/2ML IV SOLN
INTRAVENOUS | Status: DC | PRN
  Administered 2021-03-12: 400 mg via INTRAVENOUS

## 2021-03-12 MED ORDER — OXYCODONE HCL 5 MG PO TABS
10.0000 mg | ORAL_TABLET | ORAL | Status: DC | PRN
  Administered 2021-03-13: 10 mg via ORAL

## 2021-03-12 MED ORDER — POLYETHYLENE GLYCOL 3350 17 G PO PACK: 17.0000 g | PACK | ORAL | Status: DC

## 2021-03-12 MED ORDER — CEFAZOLIN SODIUM-DEXTROSE 1-4 GM/50ML-% IV SOLN
1.0000 g | Freq: Three times a day (TID) | INTRAVENOUS | Status: AC
  Administered 2021-03-12: 23:00:00 1 g via INTRAVENOUS
  Administered 2021-03-13 (×2): 2 g via INTRAVENOUS
  Filled 2021-03-12: qty 50

## 2021-03-12 MED ORDER — DEXAMETHASONE SODIUM PHOSPHATE 10 MG/ML IJ SOLN
INTRAMUSCULAR | Status: DC | PRN
  Administered 2021-03-12: 10 mg via INTRAVENOUS

## 2021-03-12 MED ORDER — PHENOL 1.4 % MT LIQD: 1.0000 | OROMUCOSAL | Status: DC | PRN

## 2021-03-12 MED ORDER — CEFAZOLIN SODIUM 1 G IJ SOLR
INTRAMUSCULAR | Status: AC
  Filled 2021-03-12: qty 40

## 2021-03-12 MED ORDER — PHENYLEPHRINE HCL-NACL 20-0.9 MG/250ML-% IV SOLN
INTRAVENOUS | Status: DC | PRN
  Administered 2021-03-12: 30 ug/min via INTRAVENOUS

## 2021-03-12 MED ORDER — KETAMINE HCL 10 MG/ML IJ SOLN
INTRAMUSCULAR | Status: DC | PRN
  Administered 2021-03-12: 20 mg via INTRAVENOUS
  Administered 2021-03-12: 10 mg via INTRAVENOUS

## 2021-03-12 MED ORDER — BUPIVACAINE LIPOSOME 1.3 % IJ SUSP
INTRAMUSCULAR | Status: DC | PRN
  Administered 2021-03-12: 10 mL via PERINEURAL

## 2021-03-12 MED ORDER — CEFAZOLIN SODIUM-DEXTROSE 2-4 GM/100ML-% IV SOLN
2.0000 g | INTRAVENOUS | Status: AC
  Administered 2021-03-12 (×2): 2 g via INTRAVENOUS
  Filled 2021-03-12: qty 100

## 2021-03-12 MED ORDER — MAGNESIUM SULFATE 2 GM/50ML IV SOLN
2.0000 g | INTRAVENOUS | Status: AC
  Administered 2021-03-12: 16:00:00 2 g via INTRAVENOUS
  Filled 2021-03-12: qty 50

## 2021-03-12 MED ORDER — BUPIVACAINE-EPINEPHRINE (PF) 0.25% -1:200000 IJ SOLN: INTRAMUSCULAR | Status: DC | PRN

## 2021-03-12 MED ORDER — METHOCARBAMOL 500 MG PO TABS
500.0000 mg | ORAL_TABLET | Freq: Four times a day (QID) | ORAL | Status: DC | PRN
  Administered 2021-03-12 – 2021-03-15 (×6): 500 mg via ORAL
  Filled 2021-03-12 (×6): qty 1

## 2021-03-12 MED ORDER — ACETAMINOPHEN 650 MG RE SUPP: 650.0000 mg | RECTAL | Status: DC | PRN

## 2021-03-12 MED ORDER — FUROSEMIDE 20 MG PO TABS
20.0000 mg | ORAL_TABLET | Freq: Every day | ORAL | Status: DC
  Administered 2021-03-14: 20 mg via ORAL
  Filled 2021-03-12: qty 1

## 2021-03-12 MED ORDER — ACETAMINOPHEN 325 MG PO TABS
650.0000 mg | ORAL_TABLET | ORAL | Status: DC | PRN
  Administered 2021-03-13: 650 mg via ORAL
  Filled 2021-03-12: qty 2

## 2021-03-12 MED ORDER — FLUOXETINE HCL 20 MG PO CAPS
40.0000 mg | ORAL_CAPSULE | Freq: Every day | ORAL | Status: DC
  Administered 2021-03-13 – 2021-03-14 (×2): 40 mg via ORAL
  Filled 2021-03-12 (×2): qty 2

## 2021-03-12 MED ORDER — BUPIVACAINE HCL (PF) 0.25 % IJ SOLN
INTRAMUSCULAR | Status: DC | PRN
  Administered 2021-03-12: 20 mL via PERINEURAL

## 2021-03-12 MED ORDER — ONDANSETRON HCL 4 MG/2ML IJ SOLN
INTRAMUSCULAR | Status: DC | PRN
  Administered 2021-03-12: 4 mg via INTRAVENOUS

## 2021-03-12 MED ORDER — FENTANYL CITRATE (PF) 100 MCG/2ML IJ SOLN: 50.0000 ug | Freq: Once | INTRAMUSCULAR | Status: AC

## 2021-03-12 MED ORDER — HYDROMORPHONE HCL 1 MG/ML IJ SOLN
INTRAMUSCULAR | Status: AC
  Filled 2021-03-12: qty 1

## 2021-03-12 MED ORDER — FENTANYL CITRATE (PF) 250 MCG/5ML IJ SOLN
INTRAMUSCULAR | Status: AC
  Filled 2021-03-12: qty 5

## 2021-03-12 MED ORDER — THROMBIN 20000 UNITS EX SOLR
CUTANEOUS | Status: AC
  Filled 2021-03-12: qty 20000

## 2021-03-12 MED ORDER — CHLORHEXIDINE GLUCONATE 0.12 % MT SOLN
15.0000 mL | Freq: Once | OROMUCOSAL | Status: AC
  Administered 2021-03-12: 12:00:00 15 mL via OROMUCOSAL
  Filled 2021-03-12: qty 15

## 2021-03-12 MED ORDER — PROPOFOL 10 MG/ML IV BOLUS
INTRAVENOUS | Status: DC | PRN
  Administered 2021-03-12: 130 mg via INTRAVENOUS

## 2021-03-12 MED ORDER — HYDROMORPHONE HCL 1 MG/ML IJ SOLN: 1.0000 mg | INTRAMUSCULAR | Status: DC | PRN

## 2021-03-12 MED ORDER — EPHEDRINE SULFATE 50 MG/ML IJ SOLN
INTRAMUSCULAR | Status: DC | PRN
  Administered 2021-03-12: 5 mg via INTRAVENOUS
  Administered 2021-03-12 (×2): 10 mg via INTRAVENOUS

## 2021-03-12 SURGICAL SUPPLY — 100 items
AGENT HMST KT MTR STRL THRMB (HEMOSTASIS) ×3
APPLIER CLIP 11 MED OPEN (CLIP) ×3
APR CLP MED 11 20 MLT OPN (CLIP) ×1
BAG COUNTER SPONGE SURGICOUNT (BAG) ×7 IMPLANT
BAG SPNG CNTER NS LX DISP (BAG) ×1
BAG SURGICOUNT SPONGE COUNTING (BAG) ×1
BLADE CLIPPER SURG (BLADE) ×2 IMPLANT
BLADE SURG 10 STRL SS (BLADE) ×8 IMPLANT
BONE MATRIX OSTEOCEL PRO LRG (Bone Implant) ×4 IMPLANT
CATH FOLEY 2WAY SLVR  5CC 16FR (CATHETERS)
CATH FOLEY 2WAY SLVR 30CC 16FR (CATHETERS) ×2 IMPLANT
CATH FOLEY 2WAY SLVR 5CC 16FR (CATHETERS) ×2 IMPLANT
CLIP APPLIE 11 MED OPEN (CLIP) ×2 IMPLANT
CLIP LIGATING EXTRA MED SLVR (CLIP) ×4 IMPLANT
CLIP NEUROVISION LG (CLIP) ×2 IMPLANT
CLOSURE STERI-STRIP 1/2X4 (GAUZE/BANDAGES/DRESSINGS)
CLOSURE WOUND 1/2 X4 (GAUZE/BANDAGES/DRESSINGS) ×2
CLSR STERI-STRIP ANTIMIC 1/2X4 (GAUZE/BANDAGES/DRESSINGS) ×2 IMPLANT
COVER SURGICAL LIGHT HANDLE (MISCELLANEOUS) ×6 IMPLANT
DISSECTOR BLUNT TIP ENDO 5MM (MISCELLANEOUS) ×4 IMPLANT
DRAIN CHANNEL 15F RND FF W/TCR (WOUND CARE) IMPLANT
DRAPE C-ARM 42X72 X-RAY (DRAPES) ×8 IMPLANT
DRAPE C-ARMOR (DRAPES) ×10 IMPLANT
DRAPE INCISE IOBAN 66X45 STRL (DRAPES) ×2 IMPLANT
DRAPE U-SHAPE 47X51 STRL (DRAPES) ×8 IMPLANT
DRSG OPSITE POSTOP 4X6 (GAUZE/BANDAGES/DRESSINGS) ×4 IMPLANT
DRSG OPSITE POSTOP 4X8 (GAUZE/BANDAGES/DRESSINGS) ×4 IMPLANT
DURAPREP 26ML APPLICATOR (WOUND CARE) ×8 IMPLANT
ELECT BLADE 4.0 EZ CLEAN MEGAD (MISCELLANEOUS) ×3
ELECT PENCIL ROCKER SW 15FT (MISCELLANEOUS) ×6 IMPLANT
ELECT REM PT RETURN 9FT ADLT (ELECTROSURGICAL) ×3
ELECTRODE BLDE 4.0 EZ CLN MEGD (MISCELLANEOUS) ×6 IMPLANT
ELECTRODE REM PT RTRN 9FT ADLT (ELECTROSURGICAL) ×4 IMPLANT
FORCEPS BIPOLAR ANGLED (INSTRUMENTS) ×2 IMPLANT
FORCEPS BIPOLAR BAYONET STR (FORCEP) ×2 IMPLANT
GLOVE SRG 8 PF TXTR STRL LF DI (GLOVE) ×2 IMPLANT
GLOVE SURG ENC MOIS LTX SZ6.5 (GLOVE) ×6 IMPLANT
GLOVE SURG ENC MOIS LTX SZ7.5 (GLOVE) ×4 IMPLANT
GLOVE SURG MICRO LTX SZ7.5 (GLOVE) ×4 IMPLANT
GLOVE SURG MICRO LTX SZ8.5 (GLOVE) ×6 IMPLANT
GLOVE SURG UNDER POLY LF SZ6.5 (GLOVE) ×6 IMPLANT
GLOVE SURG UNDER POLY LF SZ8 (GLOVE) ×3
GLOVE SURG UNDER POLY LF SZ8.5 (GLOVE) ×6 IMPLANT
GOWN STRL REUS W/ TWL LRG LVL3 (GOWN DISPOSABLE) ×6 IMPLANT
GOWN STRL REUS W/ TWL XL LVL3 (GOWN DISPOSABLE) ×6 IMPLANT
GOWN STRL REUS W/TWL 2XL LVL3 (GOWN DISPOSABLE) ×16 IMPLANT
GOWN STRL REUS W/TWL LRG LVL3 (GOWN DISPOSABLE) ×6
GOWN STRL REUS W/TWL XL LVL3 (GOWN DISPOSABLE) ×3
INSERT FOGARTY 61MM (MISCELLANEOUS) IMPLANT
INSERT FOGARTY SM (MISCELLANEOUS) IMPLANT
K-WIRE  1.6X 450L (WIRE) ×3
K-WIRE 1.6X 450L (WIRE) ×1
KIT BASIN OR (CUSTOM PROCEDURE TRAY) ×6 IMPLANT
KIT PEDICLE ACCESS (KITS) ×2 IMPLANT
KIT TURNOVER KIT B (KITS) ×6 IMPLANT
KWIRE 1.6X 450L (WIRE) IMPLANT
MODULE EMG NDL SSEP NVM5 (NEEDLE) IMPLANT
MODULE EMG NEEDLE SSEP NVM5 (NEEDLE) ×3 IMPLANT
MODULE NVM5 NEXT GEN EMG (NEEDLE) ×2 IMPLANT
NDL SPNL 18GX3.5 QUINCKE PK (NEEDLE) ×4 IMPLANT
NEEDLE 22X1 1/2 (OR ONLY) (NEEDLE) ×6 IMPLANT
NEEDLE SPNL 18GX3.5 QUINCKE PK (NEEDLE) ×3 IMPLANT
NS IRRIG 1000ML POUR BTL (IV SOLUTION) ×6 IMPLANT
PACK LAMINECTOMY ORTHO (CUSTOM PROCEDURE TRAY) ×6 IMPLANT
PACK UNIVERSAL I (CUSTOM PROCEDURE TRAY) ×6 IMPLANT
PAD ARMBOARD 7.5X6 YLW CONV (MISCELLANEOUS) ×20 IMPLANT
PROBE BALL TIP NVM5 SNG USE (BALLOONS) ×2 IMPLANT
SHIM DISC ALUMINUM (MISCELLANEOUS) ×2 IMPLANT
SHIM WIDENING (MISCELLANEOUS) ×2 IMPLANT
SPACER RISEL 22X50 7-14MM (Spacer) ×2 IMPLANT
SPACER RISEL 22X55 7-14MM (Spacer) ×4 IMPLANT
SPONGE INTESTINAL PEANUT (DISPOSABLE) ×6 IMPLANT
SPONGE SURGIFOAM ABS GEL 100 (HEMOSTASIS) ×2 IMPLANT
SPONGE T-LAP 18X18 ~~LOC~~+RFID (SPONGE) ×4 IMPLANT
SPONGE T-LAP 4X18 ~~LOC~~+RFID (SPONGE) ×14 IMPLANT
STAPLER VISISTAT 35W (STAPLE) ×6 IMPLANT
STRIP CLOSURE SKIN 1/2X4 (GAUZE/BANDAGES/DRESSINGS) ×2 IMPLANT
SURGIFLO W/THROMBIN 8M KIT (HEMOSTASIS) ×6 IMPLANT
SUT BONE WAX W31G (SUTURE) IMPLANT
SUT MNCRL AB 3-0 PS2 27 (SUTURE) ×10 IMPLANT
SUT PDS AB 1 CTX 36 (SUTURE) ×6 IMPLANT
SUT PROLENE 4 0 RB 1 (SUTURE)
SUT PROLENE 4-0 RB1 .5 CRCL 36 (SUTURE) IMPLANT
SUT PROLENE 5 0 CC1 (SUTURE) IMPLANT
SUT PROLENE 6 0 C 1 30 (SUTURE) ×2 IMPLANT
SUT PROLENE 6 0 CC (SUTURE) IMPLANT
SUT SILK 0 TIES 10X30 (SUTURE) ×2 IMPLANT
SUT SILK 2 0 TIES 10X30 (SUTURE) ×4 IMPLANT
SUT SILK 2 0SH CR/8 30 (SUTURE) IMPLANT
SUT SILK 3 0 TIES 17X18 (SUTURE)
SUT SILK 3 0SH CR/8 30 (SUTURE) IMPLANT
SUT SILK 3-0 18XBRD TIE BLK (SUTURE) ×2 IMPLANT
SUT VIC AB 1 CT1 18XCR BRD 8 (SUTURE) ×8 IMPLANT
SUT VIC AB 1 CT1 8-18 (SUTURE) ×6
SUT VIC AB 2-0 CT1 18 (SUTURE) ×12 IMPLANT
SYR BULB IRRIG 60ML STRL (SYRINGE) ×6 IMPLANT
TAPE CLOTH 4X10 WHT NS (GAUZE/BANDAGES/DRESSINGS) ×6 IMPLANT
TOWEL GREEN STERILE (TOWEL DISPOSABLE) ×10 IMPLANT
TOWEL GREEN STERILE FF (TOWEL DISPOSABLE) ×6 IMPLANT
WATER STERILE IRR 1000ML POUR (IV SOLUTION) ×4 IMPLANT

## 2021-03-12 NOTE — Anesthesia Procedure Notes (Signed)
Procedure Name: MAC Date/Time: 03/12/2021 1:25 PM Performed by: Annamary Carolin, CRNA Pre-anesthesia Checklist: Patient identified Patient Re-evaluated:Patient Re-evaluated prior to induction Oxygen Delivery Method: Circle System Utilized Preoxygenation: Pre-oxygenation with 100% oxygen Induction Type: IV induction Ventilation: Mask ventilation without difficulty Laryngoscope Size: Glidescope and 4 Grade View: Grade I Tube type: Oral Tube size: 8.0 mm Number of attempts: 1 Airway Equipment and Method: Stylet Placement Confirmation: ETT inserted through vocal cords under direct vision, positive ETCO2 and breath sounds checked- equal and bilateral Secured at: 24 cm Tube secured with: Tape Dental Injury: Teeth and Oropharynx as per pre-operative assessment

## 2021-03-12 NOTE — OR Nursing (Signed)
A/P XRAY TAKEN OF ABDOMEN. NO RETAINED OBJECTS REPORTED BY RADIOLOGIST AT 1815

## 2021-03-12 NOTE — Op Note (Signed)
OPERATIVE REPORT  DATE OF SURGERY: 03/12/2021  PATIENT NAME:  Vernon Bishop MRN: [id number] DOB: 02/08/1941  PCP: Cassandria Anger, MD  PRE-OPERATIVE DIAGNOSIS:  Degenerative lumbar disc disease with degenerative scoliosis and spinal stenosis with neurogenic claudication L2-5  POST-OPERATIVE DIAGNOSIS: Same  PROCEDURE:   Lateral interbody fusion L2-5.  XLIF: L2-3.  OLIF: L3-5.  SURGEON:  Melina Schools, MD  PHYSICIAN ASSISTANT: Nelson Chimes, PA  Approach surgeon/vascular surgeon: Dr. Monica Martinez  ANESTHESIA:   General  EBL: 626 ml   Complications: None  Implants: Globus Rise-L expandable cage.  L2/3: 50 mm length parallel cage.  L3-4 and L4-5: 55 mm length parallel cage.  Graft: Osteocell  Neuro monitoring: No adverse free running EMG or SSEP activity throughout the case.  BRIEF HISTORY: Vernon Bishop is a 80 y.o. male who presents to my office with complaints of severe back buttock and neuropathic leg pain.  Attempts at conservative management had failed to alleviate results well for quality of life we elected to proceed with surgery.  All appropriate risks, benefits, and alternatives were discussed with the patient and consent was obtained.  Preoperative medical evaluation was done as well.  PROCEDURE DETAILS: Patient was brought into the operating room and was properly positioned on the operating room table.  After induction with general anesthesia the patient was endotracheally intubated.  A timeout was taken to confirm all important data: including patient, procedure, and the level. Teds, SCD's were applied.  Foley was also inserted.  Neuro monitoring representative then placed all appropriate needles and pads for intraoperative EMG and SSEP monitoring.  The patient was turned into the lateral decubitus position left side up.  An axillary roll was placed and all bony prominences well-padded.  The left arm was placed into the elevated arm holder and the patient  was secured directly to the pro axis spine needle.  Fluoroscopy was then used to confirm satisfactory positioning of the patient in both the AP and lateral planes.  Once we confirm satisfactory positioning the patient was prepped and draped in a standard fashion.  At this point time Dr. Carlis Abbott performed a standard oblique retroperitoneal approach to the spine.  He was able to adequately expose the L4-5 and the L3-4 level.  Since we are able to expose both I elected to place L3-4 level through an OLIF approach rather than exiting.  Once the appropriate retractors were placed and the L4-5 level was exposed I scrubbed into the case and Dr. Carlis Abbott scrubbed out.  An annulotomy was performed and I used pituitary rongeurs, box osteotome, and curettes to remove all of the disc material.  I then used a fine curette to begin working posteriorly along the posterior aspect of the annulus.  Using fluoroscopy I confirmed that I was at the posterior annulus removing the osteophyte.  I used a fine nerve hook to pass behind the vertebral body of L5 and L4.  I confirmed that I had adequately decompressed this level.  I then used the curved curette to completely release the annulus along the posterior aspect of the vertebral bodies.  I then used my sidecutting curettes to ensure I removed all the cartilaginous endplate and I had bleeding subchondral bone.  At this point with the discectomy complete I then trialed and elected to use the 7 to 14 mm parallel 55 mm length expandable cage.  The cage was packed initially with allograft and malleted into position.  I then began expanding the cage.  I was  able to expanded approximately 4 mm to a final height of 12.5 mm.  I had excellent restoration of intervertebral height with indirect foraminal decompression.  The cage was properly seated and was firmly in position.  I then backfilled additional bone graft into the cage.  I then placed bone graft along the anterior margin of the cage and  along the lateral surface. at this point the OLIF at L4-5 was complete.  Dr. Carlis Abbott then scrubbed back into the case and reposition the retractors to expose the L3-4 level.  At this point using the same technique an annulotomy was performed at L3-4 and I removed all the disc material.  I again used a box osteotome, curettes, and Kerrison rongeurs to effectively remove the disc material.  I was able to scrape the endplates to remove the cartilaginous portion and expose the bleeding subchondral bone.  I made sure to release the contralateral annulus and run my small curved curette behind the vertebral body of L3 and L4 to adequately released the annulus.  At this point I used the same size cage that I had used at L4-5.  The cage was advanced and then expanded to a final height of approximately 11 mm.  The inserting device was removed and the cage was backfilled with additional bone graft.  I again placed bone graft along the anterior margin of the graft and along the lateral surface.  I removed all the retractors after copiously irrigating wound and confirming hemostasis.  I then placed Floseal to aid in gaining hemostasis.  The fascia of the external oblique was identified and closed with a running #1 PDS suture.  I then closed the fascia with interrupted 2-0 Vicryl suture, 3-0 Monocryl for the skin.  At this point I then identified the lateral border of the L2-3 disc space and marked out the lateral incision site.  I infiltrated this incision site with quarter percent Marcaine with epinephrine and then made a small incision centered over the L2-3 disc space.  I sharply dissected down to the fascia of the external oblique.  A second small incision was made 1 fingerbreadth posterior and I bluntly dissected through this down to the posterior aspect of the retroperitoneal fascia I advanced into the retroperitoneal fascia and mobilized the soft tissue and then began dissecting from the undersurface of the external  oblique until I could see my finger in the lateral incision site I then advanced the first dilating tube to the lateral aspect of the L2-3 disc space.  I circumferentially stimulated to confirm I was not traumatizing the plexus.  I then placed the second dilator and the working tracking system over the final dilator.  I then stimulated behind each of the blades confirming there was no compression or trauma to the lumbar plexus.  I then gently mobilized my posterior blade back to the posterior third of the annulus.  I confirmed satisfactory position in the AP and lateral planes.  The shim was then advanced to secured to the disc space.  I then mobilized the lateral retracting blades anteriorly so I had complete visualization of the lateral aspect of the L2-3 annulus.  I confirmed satisfactory position of my retracting blades in both the AP and lateral planes.  At this point time an annulotomy was performed with a 10 blade scalpel and I used a box osteotome as well as curettes and Kerrison rongeurs to remove all of the disc material.  I again made sure to release the contralateral annulus.  Once I had removed the disc material, I rasped the endplates still had bleeding subchondral bone.  Once I had removed the disc material and released the contralateral annulus I then placed the trialing device and elected to use the 7 to 14 mm parallel cage that was 50 mm in length.  The cage was obtained and packed with bone graft and then malleted to then gently inserted to the appropriate depth.  I then expanded the cage to approximately 10 mm.  At this point his overall alignment had improved and I was pleased with the cages L2-5.  I then remove the inserting device and backfilled the cage as it had done at the other levels.  I then irrigated the wound copiously normal saline placed Floseal to aid in postoperative hemostasis.  I then remove the retracting devices.  I closed the fascia of the external oblique with #1 PDS suture  then a layer 2-0 Vicryl suture, followed by 3-0 Monocryl for the skin.  The posterior small incision was irrigated out and closed in a layered fashion with interrupted #1 Vicryl suture, 2-0 Vicryl suture, and a 3-0 Monocryl.  A final AP x-ray was taken and read by the radiologist confirming there were no retained surgical implants/instruments.  Dry dressings were applied to all 3 wounds and the patient was ultimately extubated and transferred the PACU without incident.  The end of the case all needle sponge counts were correct.  Patient will return tomorrow for planned posterior supplemental pedicle screw fixation L2-5. First Assistant: Nelson Chimes: She was instrumental in assisting with positioning, retraction, visualization during surgery and implantation of the devices.  She was also responsible for wound closure.  Melina Schools, MD  03/12/2021 6:09 PM

## 2021-03-12 NOTE — Anesthesia Postprocedure Evaluation (Signed)
Anesthesia Post Note  Patient: Vernon Bishop  Procedure(s) Performed: OBLIQUE LUMBAR INTERBODY FUSION 1 LEVEL (OLIF L4-5, XLIF L2-4) ANTERIOR LATERAL LUMBAR FUSION 2 LEVELS ABDOMINAL EXPOSURE     Patient location during evaluation: PACU Anesthesia Type: General Level of consciousness: awake and alert, patient cooperative and oriented Pain management: pain level controlled Vital Signs Assessment: post-procedure vital signs reviewed and stable Respiratory status: spontaneous breathing, nonlabored ventilation and respiratory function stable Cardiovascular status: blood pressure returned to baseline and stable Postop Assessment: no apparent nausea or vomiting Anesthetic complications: no   No notable events documented.  Last Vitals:  Vitals:   03/12/21 1955 03/12/21 2011  BP: (!) 158/56 (!) 160/66  Pulse: (!) 49 (!) 55  Resp: 13 18  Temp: 36.7 C 36.8 C  SpO2: 99% 93%                  Hailei Besser,E. Mera Gunkel

## 2021-03-12 NOTE — Anesthesia Procedure Notes (Signed)
Anesthesia Regional Block: TAP block   Pre-Anesthetic Checklist: , timeout performed,  Correct Patient, Correct Site, Correct Laterality,  Correct Procedure, Correct Position, site marked,  Risks and benefits discussed,  Surgical consent,  Pre-op evaluation,  At surgeon's request and post-op pain management  Laterality: Left  Prep: chloraprep       Needles:  Injection technique: Single-shot  Needle Type: Echogenic Stimulator Needle     Needle Length: 10cm  Needle Gauge: 21     Additional Needles:   Procedures:,,,, ultrasound used (permanent image in chart),,    Narrative:  Start time: 03/12/2021 12:18 PM End time: 03/12/2021 12:24 PM Injection made incrementally with aspirations every 5 mL.  Performed by: Personally   Additional Notes: No pain on injection. No increased resistance to injection. Injection made in 5cc increments.  Good needle visualization.  Patient tolerated procedure well.

## 2021-03-12 NOTE — H&P (Signed)
Patient name: Vernon Bishop        MRN: [id number]        DOB: Apr 07, 1940            Sex: male   REASON FOR CONSULT: Evaluate for L4-L5 OLIF   HPI: Vernon Bishop is a 80 y.o. male, with history of hypertension hyperlipidemia and chronic lower back pain who presents for evaluation of L4-L5 OLIF.  Patient states he has had lower back pain for years.  He has been evaluated Dr. Rolena Infante after failing conservative management who has recommended L4-L5 OLIF and vascular surgery was asked to assist with abdominal exposure.  Previous abdominal surgery includes open cholecystectomy and open appendectomy.  He is a retired Airline pilot and worked with Engineer, materials for [employer]       Past Medical History:  Diagnosis Date   Arthralgia     Atrial fibrillation ([place])      1992   Benign prostatic hyperplasia     DDD (degenerative disc disease)     Depression     Diabetes mellitus      type II  - PT STATES NOT ON ANY DIABETIC MEDICINES   Dysrhythmia      aFIB   Dyssomnia     ED (erectile dysfunction)     Hyperlipidemia     Hypertension     Hypogonadism male     Joint pain     LBP (low back pain)     Multiple actinic keratoses     Muscle weakness      Upper limb   MVP (mitral valve prolapse)     Neck pain     OSA on CPAP      cpap   Osteoarthritis of hip     Osteoarthritis, hand      SEVERE LOWER BACK PAIN, AND RESTLESS LEG SYNDROME   Otalgia of right ear     Pain in left knee     Pain in thoracic spine     Palpitations     PTSD (post-traumatic stress disorder)     Seizure (H[facility])     Shoulder pain     Subjective visual disturbance     Swallowing problem      HX OF PILLS GETTING "STUCK" IN THROAT AT TIMES   Swelling     Type 2 diabetes mellitus ([place])     Vitamin D deficiency             Past Surgical History:  Procedure Laterality Date   APPENDECTOMY       BACK SURGERY   1987   CATARACT EXTRACTION       CERVICAL LAMINECTOMY   2004    [name], rod was placed- HAS ROM  LIMITATIONS   CHOLECYSTECTOMY       COLONOSCOPY       KNEE SURGERY        left knee/arthroscopic   TOTAL HIP ARTHROPLASTY Right 03/14/2013    Procedure: RIGHT TOTAL HIP ARTHROPLASTY ANTERIOR APPROACH;  Surgeon: [name], MD;  Location: [facility];  Service: Orthopedics;  Laterality: Right;   TOTAL HIP ARTHROPLASTY Left 05/23/2019    Procedure: TOTAL HIP ARTHROPLASTY ANTERIOR APPROACH;  Surgeon: Paralee Cancel, MD;  Location: [facility];  Service: Orthopedics;  Laterality: Left;  70 mins           Family History  Problem Relation Age of Onset   Allergies Mother     Stroke Mother  Clotting disorders   Heart disease Mother     Heart disease Father     Heart attack Father 14   Heart disease Brother     Rheum arthritis Paternal Grandmother     Other Paternal Grandmother          Rheumatism   Coronary artery disease Other     Diabetes Other     Hypertension Other     Esophageal cancer Maternal Aunt     Colon cancer Neg Hx     Rectal cancer Neg Hx     Stomach cancer Neg Hx        SOCIAL HISTORY: Social History         Socioeconomic History   Marital status: Married      Spouse name: Not on file   Number of children: 4   Years of education: Not on file   Highest education level: Not on file  Occupational History   Occupation: Retired-      Fish farm manager: RETIRED      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  Tobacco Use   Smoking status: Former      Packs/day: 0.50      Years: 12.00      Pack years: 6.00      Types: Cigarettes      Quit date: 03/31/1971      Years since quitting: 49.8   Smokeless tobacco: Never   Tobacco comments:      Started at age 54  Vaping Use   Vaping Use: Never used  Substance and Sexual Activity   Alcohol use: Yes      Alcohol/week: 2.0 standard drinks      Types: 2 Cans of beer per week      Comment: occasionally   Drug use: No   Sexual activity: Yes  Other Topics Concern   Not on file  Social History Narrative    FAMILY HISTORY    History of CAD  Male 1st degree relative <50    History Diabetes 1st degree relative    History hypertension                        Social Determinants of Health       Financial Resource Strain: Low Risk    Difficulty of Paying Living Expenses: Not hard at all  Food Insecurity: No Food Insecurity   Worried About Charity fundraiser in the Last Year: Never true   Ran Out of Food in the Last Year: Never true  Transportation Needs: No Transportation Needs   Lack of Transportation (Medical): No   Lack of Transportation (Non-Medical): No  Physical Activity: Inactive   Days of Exercise per Week: 0 days   Minutes of Exercise per Session: 0 min  Stress: No Stress Concern Present   Feeling of Stress : Not at all  Social Connections: Socially Integrated   Frequency of Communication with Friends and Family: More than three times a week   Frequency of Social Gatherings with Friends and Family: More than three times a week   Attends Religious Services: 1 to 4 times per year   Active Member of [employer] or Organizations: Yes   Attends Archivist Meetings: 1 to 4 times per year   Marital Status: Married  Human resources officer Violence: Not At Risk   Fear of Current or Ex-Partner: No   Emotionally Abused: No   Physically Abused: No   Sexually Abused: No  Allergies  Allergen Reactions   Abilify [Aripiprazole]        SOB feeling   Cymbalta [Duloxetine Hcl]        constipation   Digoxin And Related        HR 41   Duloxetine        REACTION: constipation   Prednisolone        Edema, wt gain   Temazepam              Current Outpatient Medications  Medication Sig Dispense Refill   acetaminophen (TYLENOL) 500 MG tablet Take 1,000 mg by mouth daily.       amLODipine (NORVASC) 5 MG tablet Take 1 tablet (5 mg total) by mouth daily. 90 tablet 3   Cholecalciferol (VITAMIN D3) 1000 UNIT tablet Take 1,000 Units by mouth daily.         ciclopirox (PENLAC) 8 % solution APPLY OVER NAIL AND  SURROUNDING SKIN. APPLY DAILY OVER PREVIOUS COAT. AFTER SEVEN (7) DAYS, MAY REMOVE WITH ALCOHOL AND AND CONTINUE CYCLE. 6.6 mL 1   clotrimazole-betamethasone (LOTRISONE) cream APPLY TO AFFECTED AREA 2 TIMES A DAY. 45 g 1   Cyanocobalamin (VITAMIN B-12 CR) 1000 MCG TBCR Take 1,000 mcg by mouth daily.         docusate sodium (COLACE) 100 MG capsule Take 1 capsule (100 mg total) by mouth 2 (two) times daily. 28 capsule 0   dutasteride (AVODART) 0.5 MG capsule Take 1 capsule (0.5 mg total) by mouth daily. 30 capsule 11   FLUoxetine (PROZAC) 40 MG capsule TAKE 1 CAPSULE BY MOUTH DAILY. 90 capsule 2   gabapentin (NEURONTIN) 300 MG capsule TAKE 1 CAPSULE BY MOUTH THREE TIMES A DAY 90 capsule 11   ketoconazole (NIZORAL) 2 % shampoo APPLY AS DIRECTED 2 TIMES A WEEK 120 mL 0   morphine (MS CONTIN) 30 MG 12 hr tablet Take 1 tablet (30 mg total) by mouth every 12 (twelve) hours. (Patient taking differently: Take 30 mg by mouth every 8 (eight) hours.) 60 tablet 0   Polyethyl Glycol-Propyl Glycol (LUBRICANT EYE DROPS) 0.4-0.3 % SOLN Place 1 drop into both eyes 3 (three) times daily as needed (dry/irritated eyes.).       polyethylene glycol (MIRALAX / GLYCOLAX) 17 g packet Take 17 g by mouth 2 (two) times daily. (Patient taking differently: Take 17 g by mouth every other day.) 28 packet 0   propranolol (INDERAL) 80 MG tablet TAKE 1 TABLET BY MOUTH 3 TIMES DAILY. 270 tablet 3   testosterone cypionate (DEPOTESTOSTERONE CYPIONATE) 200 MG/ML injection INJECT 3 MILLILITERS INTO THE MUSCLE EVERY 14 DAYS 10 mL 5   tiZANidine (ZANAFLEX) 4 MG tablet TAKE 1 TABLET BY MOUTH EVERY 8 HOURS AS NEEDED FOR MUSCLE SPASMS. 60 tablet 1   traZODone (DESYREL) 50 MG tablet TAKE 1 TO 2 TABLETS BY MOUTH AT BEDTIME. 60 tablet 5    No current facility-administered medications for this visit.      REVIEW OF SYSTEMS:  [X]  denotes positive finding, [ ]  denotes negative finding Cardiac   Comments:  Chest pain or chest pressure:       Shortness of breath upon exertion:      Short of breath when lying flat:      Irregular heart rhythm:             Vascular      Pain in calf, thigh, or hip brought on by ambulation:      Pain in feet at night  that wakes you up from your sleep:       Blood clot in your veins:      Leg swelling:              Pulmonary      Oxygen at home:      Productive cough:       Wheezing:              Neurologic      Sudden weakness in arms or legs:       Sudden numbness in arms or legs:       Sudden onset of difficulty speaking or slurred speech:      Temporary loss of vision in one eye:       Problems with dizziness:              Gastrointestinal      Blood in stool:       Vomited blood:              Genitourinary      Burning when urinating:       Blood in urine:             Psychiatric      Major depression:              Hematologic      Bleeding problems:      Problems with blood clotting too easily:             Skin      Rashes or ulcers:             Constitutional      Fever or chills:          PHYSICAL EXAM:     Vitals:    01/07/21 0953 01/07/21 0959  BP: (!) 193/77 (!) 182/74  Pulse: (!) 47 (!) 47  Resp: 16    Temp: 97.9 F (36.6 C)    TempSrc: Temporal    SpO2: (!) 47%    Weight: 210 lb (95.3 kg)    Height: 6' (1.829 m)        GENERAL: The patient is a well-nourished male, in no acute distress. The vital signs are documented above. CARDIAC: There is a regular rate and rhythm.  VASCULAR:  Palpable femoral pulses bilaterally Palpable PT pulses bilaterally PULMONARY: No respiratory distress. ABDOMEN: Soft and non-tender.  Previous open cholecystectomy incision right upper quadrant. MUSCULOSKELETAL: There are no major deformities or cyanosis. NEUROLOGIC: No focal weakness or paresthesias are detected. SKIN: There are no ulcers or rashes noted. PSYCHIATRIC: The patient has a normal affect.   DATA:    MRI lumbar spine reviewed from 10/24/2020 and aortic  bifurcation at L3-L4 with vein bifurcation at L4-L5 disc space   Assessment/Plan:   80 year old male with chronic lower back pain that presents for preop evaluation of planned L4-L5 OLIF with Dr. Rolena Infante.  I have reviewed his MRI and I think he would be a good candidate for oblique approach.  I discussed him being in the right lateral position with a oblique incision over the left oblique muscles and then mobilizing the peritoneum and left ureter as well as the left psoas to expose the disc space at L4-L5.  We talked about possibly needing to mobilize the iliac vessels as well.  Discussed risk of injury to the above structures.    I also discussed possible exposure at L3-L4 as well today through the oblique exposure.  He is amendable  to proceed.  All questions answered.   Marty Heck, MD [facility] Office: [phone]

## 2021-03-12 NOTE — Op Note (Signed)
Date: March 12, 2021  Preoperative diagnosis: Chronic lower back pain with neuropathic leg pain  Postoperative diagnosis: Same  Procedure: Oblique spine exposure at the L3-L4 and L4-L5 disc space via retroperitoneal approach for L3-L4 OLIF and L4-L5 OLIF  Surgeon: Dr. Marty Heck, MD  Co-surgeon: Dr. Melina Schools, MD  Assistant: Nelson Chimes, PA  Indications: Patient is an 80 year old male with chronic lower back pain  and neuropathic leg pain who has failed conservative management and has been evaluated by Dr. Rolena Infante.  Ultimately Dr. Rolena Infante has recommended multi-level fusion at L2-L5.  Vascular surgery has been asked to assist with oblique exposure at L4-L5 and possibly at L3-L4.  Risk benefits discussed.  Findings: The patient was placed in right lateral position and all pressure points were padded.  The L3-L4 and L4-L5 disc space were marked over the left oblique muscles with a fluoroscopic C arm.  An incision was made 2 fingerbreadths off the iliac crest over the left oblique muscles.  Dissected down and entered the retroperitoneum using a muscle-sparing technique through the oblique muscles and the peritoneum and left ureter were mobilized across midline.  The left psoas was then mobilized away from the disc base to get good working room initially at L4-L5 and then at L3-L4.  Retractors were placed at each position and we confirmrf we were at the correct level on lateral fluoroscopy.  Anesthesia: General  Details: Patient was taken to the operating room after informed consent was obtained.  Placed on the operating table in the supine position and general endotracheal anesthesia was induced.  Patient was then rolled in the right lateral position and all pressure points were padded.  Fluoroscopic C-arm was brought in the lateral position and the L3-L4 and L4-L5 disc space were marked over the left oblique muscles.  The abdomen, left flank, and back were then prepped and draped in  standard sterile fashion with the patient in the right lateral position.  Antibiotics were given and timeout was performed.  Initially made an incision over the left oblique muscles at the preoperative mark about two fingerbreadths off the iliac crest.  Dissected down through the subcutaneous tissue with Bovie cautery and used cerebellar retractors for added visualization.  The external oblique fascia was then opened with Bovie cautery and the other layers of the oblique muscles were divided with a muscle-sparing technique and then we entered the retroperitoneum and the peritoneum was mobilized out of the retroperitoneum including left ureter to visualize the left psoas and the left iliac vessels.  Initially went to the L4-L5 disc space and used hand-held Wiley retractors to bluntly mobilize the left psoas off the L4-L5 disc space.  There were number of osteophytes here and this was done with a laparoscopic dissector and suction.  We had good working room at the L4-5 disc space and ultimately spinal needle was placed to confirm we were at the correct level.  I then went slightly higher to the L3-L4 disc space that I could manually palpate in the wound and mobilized the left psoas off of this disc space as well and lateral to the disc space.  At this point, I brought fixed retractors on the field for OLIF.  140 reverse lip was placed medial to the disc space with the patient in the right lateral position to move the iliac vessels out of the way.  Two 160 reverse lips were then placed lateral to the disc base with the patient in the right lateral position to move  the left psoas away from the disc space.  Case was turned over to Dr. Rolena Infante.  After he was done with the implant at L4-L5, I was called back into the room.  I had already mobilized left psoas bluntly off the L3-L4 disc space.  I then used the same retractor system and used a 140 reverse lip that was placed to the medial part of the disc space with the  patient in the right lateral to move the aorta away from the disc space.  I then used two 160 reverse lip retractors to pull the psoas lateral to the disc space with the patient in the right lateral position.  Spinal needle was placed in the disc space and we confirmed we were at the L3-L4 level on lateral fluoroscopy.  Case was turned over to Dr. Rolena Infante.  Complication: None  Condition: Stable  Marty Heck, MD [facility] Office: [place]

## 2021-03-12 NOTE — Consult Note (Signed)
[facility] Medical Consultation  Vernon Bishop IZT:[id number] DOB: [birth year] DOA: 03/12/2021 PCP: Cassandria Anger, MD   Requesting physician: Dr. Melina Schools Date of consultation: 03/12/2021 Reason for consultation: medical management  Impression/Recommendations Principal Problem:   S/P lumbar fusion    Atrial fibrillation -reportedly this was in the remote past back around 1998. Not yet on anticoagulation. Per outpatient [facility] Cardiology they would like to pursue 2 week cardiac telemetry monitor after spinal surgery to decided on anticoagulation with CHA2DS2-VASc score of at least 3.   -If he goes into A.fib but is rate control, would just monitor at this time  2. Chronic Bradycardia Asymptomatic. Continue to monitor with normal vital sign checks Continue home propranolol 80mg  TID   3.Pulmonary HTN  Last echo-01/15/21- Patient has moderate left atrial dilatation, grade 1 diastolic dysfunction with preserved LVEF 55-60%, no right-sided dilation, moderate TR, with estimated RVSP 47 mmHg. Most likely WHO group 2 per cardiology  4.HTN Mildly elevated -suspect secondary to pain from surgery. Will hold on any PRN unless more significantly elevated  Continue amlodipine, Lasix   5. OSA Continue CPAP  6. Questionable Type 2 DM -pt denies diagnosis of this although unsure if he has been diagnosed with pre-diabetes in the past. Last  HbA1C well controlled at 4.9 at 01/14/21   7.Lumbar spinal surgery S/p OLIF L4-5, XLIF L2-4 on 03/12/21 Planned posterior approach tomorrow with ortho Surgical and Pain management per primary orthopedic team   I will followup again tomorrow. Please contact me if I can be of assistance in the meanwhile. Thank you for this consultation.  Chief Complaint: back pain  HPI:  Vernon Bishop is an 80yo male with hx of chronic back pain HTN, atrial fibrillation not on anticoagulation, bradycardia, OSA on CPAP, BPH who hospitalist is being  consulted on for management of medical comorbidies by orthopedics.   Patient has chronic back, buttock and neuropathic leg pain and underwent lateral interbody fusion of L2-5 today 03/12/21 with orthopedics Dr. Rolena Infante. Vascular surgery Dr. Carlis Abbott was also involved with abdominal exposure during surgery. Pt was seen pre-operatively by [facility] cardiology on 01/22/21 and cleared with nuclear stress test although I am not able to locate results of this.   There is plans for him to return to OR tomorrow for more posterior instructmentation with XLIF at L 2-3, OLIF at L4-5 and if feasible at L3-4 as well per ortho.   He is Stable postoperatively.  Reports current back pain and 8 out of 10. Reports a remote diagnosis of atrial fibrillation back in the 90s.  Denies any issues with chest pain or palpitation.  Denies any history of diabetes which is on his medical record.  Reports compliance on CPAP and brought machine from home today.   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 Lives with wife. Quit tobacco use about 50 yrs ago. Occasional ETOH use about once a month. Denies recreational drug use.  Past Medical History:  Diagnosis Date   Arthralgia    Atrial fibrillation ([place])    1992   Benign prostatic hyperplasia    Bradycardia    "my doctor says I have bradycardia, my heart rate is always in the 40s"   DDD (degenerative disc disease)    Depression    Diabetes mellitus    type II  - PT STATES NOT ON ANY DIABETIC MEDICINES   Dysrhythmia    aFIB  Dyssomnia    ED (erectile dysfunction)    Hyperlipidemia    Hypertension    Hypogonadism male    Joint pain    LBP (low back pain)    Multiple actinic keratoses    Muscle weakness    Upper limb   MVP (mitral valve prolapse)    Neck pain    OSA on CPAP    cpap   Osteoarthritis of hip    Osteoarthritis, hand    SEVERE LOWER BACK PAIN, AND RESTLESS  LEG SYNDROME   Otalgia of right ear    Pain in left knee    Pain in thoracic spine    Palpitations    PTSD (post-traumatic stress disorder)    Shoulder pain    Subjective visual disturbance    Swallowing problem    HX OF PILLS GETTING "STUCK" IN THROAT AT TIMES   Swelling    Type 2 diabetes mellitus ([place]    pt states he has never had it   Vitamin D deficiency    Past Surgical History:  Procedure Laterality Date   APPENDECTOMY     BACK SURGERY  1987   CATARACT EXTRACTION     CERVICAL LAMINECTOMY  2004   [name], rod was placed- HAS ROM LIMITATIONS   CHOLECYSTECTOMY     COLONOSCOPY     JOINT REPLACEMENT     KNEE SURGERY     left knee/arthroscopic   TOTAL HIP ARTHROPLASTY Right 03/14/2013   Procedure: RIGHT TOTAL HIP ARTHROPLASTY ANTERIOR APPROACH;  Surgeon: [name], MD;  Location: [facility];  Service: Orthopedics;  Laterality: Right;   TOTAL HIP ARTHROPLASTY Left 05/23/2019   Procedure: TOTAL HIP ARTHROPLASTY ANTERIOR APPROACH;  Surgeon: Paralee Cancel, MD;  Location: [facility];  Service: Orthopedics;  Laterality: Left;  70 mins   Social History:  reports that he quit smoking about 49 years ago. His smoking use included cigarettes. He has a 6.00 pack-year smoking history. He has never used smokeless tobacco. He reports current alcohol use of about 2.0 standard drinks per week. He reports that he does not use drugs.  Allergies  Allergen Reactions   Abilify [Aripiprazole] Shortness Of Breath    SOB feeling   Cymbalta [Duloxetine Hcl]     constipation   Digoxin And Related     HR 41   Prednisolone Swelling    Edema, wt gain   Temazepam     Pt unsure of this    Family History  Problem Relation Age of Onset   Allergies Mother    Stroke Mother        Clotting disorders   Heart disease Mother    Heart disease Father    Heart attack Father 23   Heart disease Brother    Rheum arthritis Paternal Grandmother    Other Paternal Grandmother        Rheumatism   Coronary  artery disease Other    Diabetes Other    Hypertension Other    Esophageal cancer Maternal Aunt    Colon cancer Neg Hx    Rectal cancer Neg Hx    Stomach cancer Neg Hx     Prior to Admission medications   Medication Sig Start Date End Date Taking? Authorizing Provider  acetaminophen (TYLENOL) 500 MG tablet Take 1,000 mg by mouth every 8 (eight) hours as needed for moderate pain.   Yes [provider]  amLODipine (NORVASC) 5 MG tablet Take 1 tablet (5 mg total) by mouth daily. 08/30/20 08/30/21  Yes [name], [name], MD  Cholecalciferol (VITAMIN D3) 1000 UNIT tablet Take 1,000 Units by mouth daily.     Yes [provider]  Cyanocobalamin (VITAMIN B-12 CR) 1000 MCG TBCR Take 1,000 mcg by mouth daily.     Yes [provider]  FLUoxetine (PROZAC) 40 MG capsule TAKE 1 CAPSULE BY MOUTH DAILY. 07/26/20  Yes [name], [name], MD  furosemide (LASIX) 20 MG tablet Take 1 tablet (20 mg total) by mouth daily. 01/15/21  Yes [name], [name], MD  gabapentin (NEURONTIN) 300 MG capsule TAKE 1 CAPSULE BY MOUTH THREE TIMES A DAY 11/01/20  Yes [name], [name], MD  morphine (MS CONTIN) 30 MG 12 hr tablet Take 1 tablet (30 mg total) by mouth every 12 (twelve) hours. Patient taking differently: Take 30 mg by mouth every 8 (eight) hours. 09/19/19  Yes [name], [name], MD  Polyethyl Glycol-Propyl Glycol (LUBRICANT EYE DROPS) 0.4-0.3 % SOLN Place 1 drop into both eyes 3 (three) times daily as needed (dry/irritated eyes.).   Yes [provider]  polyethylene glycol (MIRALAX / GLYCOLAX) 17 g packet Take 17 g by mouth 2 (two) times daily. Patient taking differently: Take 17 g by mouth every other day. 05/24/19  Yes Babish, Rodman Key, PA-C  propranolol (INDERAL) 80 MG tablet TAKE 1 TABLET BY MOUTH 3 TIMES DAILY. 12/12/20  Yes [name], [name], MD  sodium chloride (OCEAN) 0.65 % SOLN nasal spray Place 1 spray into both nostrils as needed for congestion.   Yes [provider]  testosterone cypionate (DEPOTESTOSTERONE CYPIONATE) 200 MG/ML injection INJECT 3 MILLILITERS INTO THE MUSCLE EVERY 14 DAYS 10/14/20  Yes [name], [name], MD  tiZANidine (ZANAFLEX) 4 MG tablet TAKE 1 TABLET BY MOUTH EVERY 8 HOURS AS NEEDED FOR MUSCLE SPASMS. 04/25/20  Yes [name], [name], MD  traZODone (DESYREL) 50 MG tablet TAKE 1 TO 2 TABLETS BY MOUTH AT BEDTIME. 02/26/21  Yes [name], [name], MD  Zinc 30 MG TABS Take 30 mg by mouth daily.   Yes [provider]  ciclopirox (PENLAC) 8 % solution APPLY OVER NAIL AND SURROUNDING SKIN. APPLY DAILY OVER PREVIOUS COAT. AFTER SEVEN (7) DAYS, MAY REMOVE WITH ALCOHOL AND AND CONTINUE CYCLE. Patient not taking: Reported on 03/04/2021 02/26/20   [name], [name], MD  clotrimazole-betamethasone (LOTRISONE) cream APPLY TO AFFECTED AREA 2 TIMES A DAY. Patient not taking: Reported on 03/04/2021 07/05/19   [name], [name], MD   Physical Exam: Blood pressure (!) 160/66, pulse (!) 55, temperature 98.2 F (36.8 C), temperature source Oral, resp. rate 18, height 6' (1.829 m), weight 95.3 kg, SpO2 93 %. Vitals:   03/12/21 1955 03/12/21 2011  BP: (!) 158/56 (!) 160/66  Pulse: (!) 49 (!) 55  Resp: 13 18  Temp: 98 F (36.7 C) 98.2 F (36.8 C)  SpO2: 99% 93%  Constitutional: NAD, calm, comfortable, laying flat in bed Eyes: lids and conjunctivae normal ENMT: Mucous membranes are moist.  Neck: normal, supple Respiratory: clear to auscultation bilaterally, no wheezing, no crackles. Normal respiratory effort.  Cardiovascular: Regular rate and rhythm, no murmurs / rubs / gallops. No extremity edema. Abdomen: soft, non-distended, no tenderness,  Bowel sounds positive.  Musculoskeletal: no clubbing / cyanosis. No joint deformity upper and lower extremities. Back exam deferred due to pain and pt is immediately post-op Skin: no rashes, lesions, ulcers.  Neurologic: CN 2-12 grossly intact. Sensation intact, Strength 5/5 in all  4.  Psychiatric: Normal judgment and insight. Alert and oriented x  3. Normal mood.   Labs on Admission:  Basic Metabolic Panel: Recent Labs  Lab 03/10/21 1132  NA 137  K 4.0  CL 104  CO2 26  GLUCOSE 93  BUN 20  CREATININE 0.96  CALCIUM 9.1   Liver Function Tests: No results for input(s): AST, ALT, ALKPHOS, BILITOT, PROT, ALBUMIN in the last 168 hours. No results for input(s): LIPASE, AMYLASE in the last 168 hours. No results for input(s): AMMONIA in the last 168 hours. CBC: Recent Labs  Lab 03/10/21 1132  WBC 7.9  HGB 14.1  HCT 41.5  MCV 90.6  PLT 211   Cardiac Enzymes: No results for input(s): CKTOTAL, CKMB, CKMBINDEX, TROPONINI in the last 168 hours. BNP: Invalid input(s): POCBNP CBG: Recent Labs  Lab 03/12/21 1154 03/12/21 1854  GLUCAP 99 104*    Radiological Exams on Admission: DG C-Arm 1-60 Min-No Report  Result Date: 03/12/2021 Fluoroscopy was utilized by the requesting physician.  No radiographic interpretation.   DG C-Arm 1-60 Min-No Report  Result Date: 03/12/2021 Fluoroscopy was utilized by the requesting physician.  No radiographic interpretation.   DG C-Arm 1-60 Min-No Report  Result Date: 03/12/2021 Fluoroscopy was utilized by the requesting physician.  No radiographic interpretation.   DG C-Arm 1-60 Min-No Report  Result Date: 03/12/2021 Fluoroscopy was utilized by the requesting physician.  No radiographic interpretation.   DG C-Arm 1-60 Min-No Report  Result Date: 03/12/2021 Fluoroscopy was utilized by the requesting physician.  No radiographic interpretation.   DG OR LOCAL ABDOMEN  Result Date: 03/12/2021 CLINICAL DATA:  Status post surgical lumbar fusion. EXAM: OR LOCAL ABDOMEN COMPARISON:  None. FINDINGS: The patient appears to be status post surgical fusion of L2-3, L3-4 and L4-5. Two vascular clips are seen projected over the sacrum. Status post bilateral total hip arthroplasty. No other radiopaque foreign body is noted.  IMPRESSION: Status post surgical posterior fusion of multiple levels of the lower lumbar spine. 2 vascular clips are seen over sacrum. No other radiopaque foreign body is noted. These results were called by telephone at the time of interpretation on 03/12/2021 at 6:18 pm to provider Grafton in OR 4, who verbally acknowledged these results. Electronically Signed   By: Marijo Conception M.D.   On: 03/12/2021 18:18      Time spent: >40 mins spent reviewing imaging, documentation, lab work, bedside evaluation and coordination of care and management  Orene Desanctis [facility]   If 7PM-7AM, please contact night-coverage [email or web address]  03/12/2021, 9:33 PM

## 2021-03-12 NOTE — H&P (Signed)
Addendum H&P: Patient continues to have severe back buttock and neuropathic leg pain.  Despite appropriate conservative management his quality of life is continued to deteriorate.  We will plan on moving forward with a staged anterior posterior fusion L2-5.  Today we will be performing lateral interbody fusions L2-5.  Tomorrow will be doing supplemental posterior instrumentation.  I have gone over the risks, benefits, and alternatives to surgery with the patient and all of his questions were addressed.  Surgical plan is an XLIF at L 2-3, OLIF at L4-5 and if feasible at L3-4 as well.  There is been no change in his clinical exam since his last office visit of 02/28/2021.

## 2021-03-12 NOTE — Brief Op Note (Signed)
03/12/2021  6:31 PM  PATIENT:  Vernon Bishop  81 y.o. male  PRE-OPERATIVE DIAGNOSIS:  Degenerative scoliosis with degenerative slip L4-5, spinal stenosis with neuropathic leg pain  POST-OPERATIVE DIAGNOSIS:  Degenerative scoliosis with degenerative slip L4-5, spinal stenosis with neuropathic leg pain  PROCEDURE:  Procedure(s) with comments: OBLIQUE LUMBAR INTERBODY FUSION 1 LEVEL (OLIF L4-5, XLIF L2-4) (N/A) - 5 hrs Left Tap Block with exparel 3 C-Bed ANTERIOR LATERAL LUMBAR FUSION 2 LEVELS (N/A) ABDOMINAL EXPOSURE (N/A)  SURGEON:  Surgeon(s) and Role: Panel 1:    Melina Schools, MD - Primary Panel 2:    Marty Heck, MD - Primary    * Marty Heck, MD - Assisting    * Broadus John, MD  PHYSICIAN ASSISTANT:   ASSISTANTS: Nelson Chimes, PA   ANESTHESIA:   general  EBL:  250 mL   BLOOD ADMINISTERED:none  DRAINS: none   LOCAL MEDICATIONS USED:  MARCAINE     SPECIMEN:  No Specimen  DISPOSITION OF SPECIMEN:  N/A  COUNTS:  YES  TOURNIQUET:  * No tourniquets in log *  DICTATION: .Dragon Dictation  PLAN OF CARE: Admit to inpatient   PATIENT DISPOSITION:  PACU - hemodynamically stable.

## 2021-03-12 NOTE — Transfer of Care (Signed)
Immediate Anesthesia Transfer of Care Note  Patient: JOHNHENRY TIPPIN  Procedure(s) Performed: OBLIQUE LUMBAR INTERBODY FUSION 1 LEVEL (OLIF L4-5, XLIF L2-4) ANTERIOR LATERAL LUMBAR FUSION 2 LEVELS ABDOMINAL EXPOSURE  Patient Location: PACU  Anesthesia Type:General  Level of Consciousness: drowsy and patient cooperative  Airway & Oxygen Therapy: Patient Spontanous Breathing and Patient connected to face mask oxygen  Post-op Assessment: Report given to RN and Patient moving all extremities X 4  Post vital signs: Reviewed and stable  Last Vitals:  Vitals Value Taken Time  BP 116/83 03/12/21 1854  Temp    Pulse 51 03/12/21 1854  Resp 14 03/12/21 1854  SpO2 100 % 03/12/21 1854  Vitals shown include unvalidated device data.  Last Pain:  Vitals:   03/12/21 1201  TempSrc:   PainSc: 8       Patients Stated Pain Goal: 2 (94/32/76 [id number])  Complications: No notable events documented.

## 2021-03-13 ENCOUNTER — Inpatient Hospital Stay (HOSPITAL_COMMUNITY): Payer: PPO

## 2021-03-13 ENCOUNTER — Inpatient Hospital Stay (HOSPITAL_COMMUNITY): Admission: RE | Admit: 2021-03-13 | Payer: PPO | Source: Ambulatory Visit | Admitting: Orthopedic Surgery

## 2021-03-13 ENCOUNTER — Inpatient Hospital Stay (HOSPITAL_COMMUNITY): Payer: PPO | Admitting: Anesthesiology

## 2021-03-13 ENCOUNTER — Encounter (HOSPITAL_COMMUNITY)
Admission: RE | Disposition: A | Discharge status: Home / Self Care | Payer: Self-pay | Source: Home / Self Care | Attending: Orthopedic Surgery

## 2021-03-13 ENCOUNTER — Encounter (HOSPITAL_COMMUNITY): Payer: Self-pay | Admitting: Orthopedic Surgery

## 2021-03-13 DIAGNOSIS — R001 Bradycardia, unspecified: Secondary | ICD-10-CM

## 2021-03-13 DIAGNOSIS — G4733 Obstructive sleep apnea (adult) (pediatric): Secondary | ICD-10-CM

## 2021-03-13 DIAGNOSIS — Z9989 Dependence on other enabling machines and devices: Secondary | ICD-10-CM

## 2021-03-13 DIAGNOSIS — Z981 Arthrodesis status: Secondary | ICD-10-CM

## 2021-03-13 DIAGNOSIS — I10 Essential (primary) hypertension: Secondary | ICD-10-CM

## 2021-03-13 DIAGNOSIS — I480 Paroxysmal atrial fibrillation: Secondary | ICD-10-CM

## 2021-03-13 LAB — GLUCOSE, CAPILLARY
Glucose-Capillary: 110 mg/dL — ABNORMAL HIGH (ref 70–99)
Glucose-Capillary: 118 mg/dL — ABNORMAL HIGH (ref 70–99)

## 2021-03-13 SURGERY — POSTERIOR LUMBAR FUSION 3 LEVEL
Anesthesia: General | Site: Spine Lumbar

## 2021-03-13 MED ORDER — CEFAZOLIN SODIUM-DEXTROSE 2-4 GM/100ML-% IV SOLN
INTRAVENOUS | Status: AC
  Filled 2021-03-13: qty 100

## 2021-03-13 MED ORDER — EPHEDRINE 5 MG/ML INJ
INTRAVENOUS | Status: AC
  Filled 2021-03-13: qty 5

## 2021-03-13 MED ORDER — OXYCODONE HCL 5 MG PO TABS
ORAL_TABLET | ORAL | Status: AC
  Filled 2021-03-13: qty 2

## 2021-03-13 MED ORDER — SODIUM CHLORIDE 0.9% FLUSH: 3.0000 mL | INTRAVENOUS | Status: DC | PRN

## 2021-03-13 MED ORDER — OXYCODONE-ACETAMINOPHEN 10-325 MG PO TABS: 1.0000 | ORAL_TABLET | Freq: Four times a day (QID) | ORAL | 0 refills | Status: AC | PRN

## 2021-03-13 MED ORDER — OXYCODONE HCL 5 MG PO TABS
10.0000 mg | ORAL_TABLET | ORAL | Status: DC | PRN
  Administered 2021-03-13 – 2021-03-15 (×9): 10 mg via ORAL
  Filled 2021-03-13 (×8): qty 2

## 2021-03-13 MED ORDER — PHENOL 1.4 % MT LIQD: 1.0000 | OROMUCOSAL | Status: DC | PRN

## 2021-03-13 MED ORDER — HYDROMORPHONE HCL 1 MG/ML IJ SOLN
1.0000 mg | INTRAMUSCULAR | Status: AC | PRN
  Administered 2021-03-13: 1 mg via INTRAVENOUS
  Filled 2021-03-13: qty 1

## 2021-03-13 MED ORDER — PHENYLEPHRINE 40 MCG/ML (10ML) SYRINGE FOR IV PUSH (FOR BLOOD PRESSURE SUPPORT)
PREFILLED_SYRINGE | INTRAVENOUS | Status: AC
  Filled 2021-03-13: qty 10

## 2021-03-13 MED ORDER — ROCURONIUM BROMIDE 10 MG/ML (PF) SYRINGE
PREFILLED_SYRINGE | INTRAVENOUS | Status: AC
  Filled 2021-03-13: qty 10

## 2021-03-13 MED ORDER — LACTATED RINGERS IV SOLN: INTRAVENOUS | Status: DC

## 2021-03-13 MED ORDER — BUPIVACAINE-EPINEPHRINE 0.25% -1:200000 IJ SOLN
INTRAMUSCULAR | Status: DC | PRN
  Administered 2021-03-13: 20 mL

## 2021-03-13 MED ORDER — PROPOFOL 1000 MG/100ML IV EMUL
INTRAVENOUS | Status: AC
  Filled 2021-03-13: qty 100

## 2021-03-13 MED ORDER — SODIUM CHLORIDE 0.9% FLUSH
3.0000 mL | Freq: Two times a day (BID) | INTRAVENOUS | Status: DC
  Administered 2021-03-13 – 2021-03-14 (×2): 3 mL via INTRAVENOUS

## 2021-03-13 MED ORDER — MENTHOL 3 MG MT LOZG: 1.0000 | LOZENGE | OROMUCOSAL | Status: DC | PRN

## 2021-03-13 MED ORDER — ONDANSETRON HCL 4 MG PO TABS: 4.0000 mg | ORAL_TABLET | Freq: Three times a day (TID) | ORAL | 0 refills | Status: DC | PRN

## 2021-03-13 MED ORDER — FENTANYL CITRATE (PF) 100 MCG/2ML IJ SOLN
INTRAMUSCULAR | Status: AC
  Filled 2021-03-13: qty 2

## 2021-03-13 MED ORDER — BUPIVACAINE-EPINEPHRINE (PF) 0.25% -1:200000 IJ SOLN
INTRAMUSCULAR | Status: AC
  Filled 2021-03-13: qty 30

## 2021-03-13 MED ORDER — DEXMEDETOMIDINE (PRECEDEX) IN NS 20 MCG/5ML (4 MCG/ML) IV SYRINGE
PREFILLED_SYRINGE | INTRAVENOUS | Status: AC
  Filled 2021-03-13: qty 5

## 2021-03-13 MED ORDER — ONDANSETRON HCL 4 MG PO TABS: 4.0000 mg | ORAL_TABLET | Freq: Four times a day (QID) | ORAL | Status: DC | PRN

## 2021-03-13 MED ORDER — FENTANYL CITRATE (PF) 250 MCG/5ML IJ SOLN
INTRAMUSCULAR | Status: AC
  Filled 2021-03-13: qty 5

## 2021-03-13 MED ORDER — LACTATED RINGERS IV SOLN: INTRAVENOUS | Status: DC | PRN

## 2021-03-13 MED ORDER — ACETAMINOPHEN 650 MG RE SUPP: 650.0000 mg | RECTAL | Status: DC | PRN

## 2021-03-13 MED ORDER — FENTANYL CITRATE (PF) 100 MCG/2ML IJ SOLN
25.0000 ug | INTRAMUSCULAR | Status: DC | PRN
  Administered 2021-03-13 (×3): 50 ug via INTRAVENOUS

## 2021-03-13 MED ORDER — PROPOFOL 10 MG/ML IV BOLUS
INTRAVENOUS | Status: AC
  Filled 2021-03-13: qty 20

## 2021-03-13 MED ORDER — DEXMEDETOMIDINE (PRECEDEX) IN NS 20 MCG/5ML (4 MCG/ML) IV SYRINGE
PREFILLED_SYRINGE | INTRAVENOUS | Status: DC | PRN
  Administered 2021-03-13 (×2): 8 ug via INTRAVENOUS
  Administered 2021-03-13: 4 ug via INTRAVENOUS

## 2021-03-13 MED ORDER — FENTANYL CITRATE (PF) 250 MCG/5ML IJ SOLN
INTRAMUSCULAR | Status: DC | PRN
  Administered 2021-03-13 (×3): 50 ug via INTRAVENOUS
  Administered 2021-03-13 (×2): 25 ug via INTRAVENOUS
  Administered 2021-03-13: 50 ug via INTRAVENOUS

## 2021-03-13 MED ORDER — ONDANSETRON HCL 4 MG/2ML IJ SOLN
INTRAMUSCULAR | Status: DC | PRN
  Administered 2021-03-13: 4 mg via INTRAVENOUS

## 2021-03-13 MED ORDER — HYDRALAZINE HCL 20 MG/ML IJ SOLN: 10.0000 mg | Freq: Four times a day (QID) | INTRAMUSCULAR | Status: DC | PRN

## 2021-03-13 MED ORDER — ONDANSETRON HCL 4 MG/2ML IJ SOLN: 4.0000 mg | Freq: Four times a day (QID) | INTRAMUSCULAR | Status: DC | PRN

## 2021-03-13 MED ORDER — DOCUSATE SODIUM 100 MG PO CAPS
100.0000 mg | ORAL_CAPSULE | Freq: Two times a day (BID) | ORAL | Status: DC
  Administered 2021-03-13 – 2021-03-14 (×3): 100 mg via ORAL
  Filled 2021-03-13 (×3): qty 1

## 2021-03-13 MED ORDER — DEXAMETHASONE SODIUM PHOSPHATE 10 MG/ML IJ SOLN
INTRAMUSCULAR | Status: AC
  Filled 2021-03-13: qty 1

## 2021-03-13 MED ORDER — SORBITOL 70 % SOLN
60.0000 mL | Freq: Once | Status: DC
  Filled 2021-03-13: qty 60

## 2021-03-13 MED ORDER — LIDOCAINE 2% (20 MG/ML) 5 ML SYRINGE
INTRAMUSCULAR | Status: AC
  Filled 2021-03-13: qty 5

## 2021-03-13 MED ORDER — PHENYLEPHRINE HCL-NACL 20-0.9 MG/250ML-% IV SOLN
INTRAVENOUS | Status: DC | PRN
  Administered 2021-03-13: 50 ug/min via INTRAVENOUS
  Administered 2021-03-13: 100 ug/min via INTRAVENOUS

## 2021-03-13 MED ORDER — ONDANSETRON HCL 4 MG/2ML IJ SOLN
INTRAMUSCULAR | Status: AC
  Filled 2021-03-13: qty 2

## 2021-03-13 MED ORDER — THROMBIN 20000 UNITS EX SOLR
CUTANEOUS | Status: AC
  Filled 2021-03-13: qty 20000

## 2021-03-13 MED ORDER — THROMBIN 20000 UNITS EX SOLR
CUTANEOUS | Status: DC | PRN
  Administered 2021-03-13: 20 mL via TOPICAL

## 2021-03-13 MED ORDER — PROPOFOL 500 MG/50ML IV EMUL
INTRAVENOUS | Status: DC | PRN
  Administered 2021-03-13: 150 mg via INTRAVENOUS

## 2021-03-13 MED ORDER — 0.9 % SODIUM CHLORIDE (POUR BTL) OPTIME
TOPICAL | Status: DC | PRN
  Administered 2021-03-13: 500 mL

## 2021-03-13 MED ORDER — CEFAZOLIN SODIUM-DEXTROSE 1-4 GM/50ML-% IV SOLN
1.0000 g | Freq: Three times a day (TID) | INTRAVENOUS | Status: AC
  Administered 2021-03-13 – 2021-03-14 (×2): 1 g via INTRAVENOUS
  Filled 2021-03-13 (×2): qty 50

## 2021-03-13 MED ORDER — PHENYLEPHRINE 40 MCG/ML (10ML) SYRINGE FOR IV PUSH (FOR BLOOD PRESSURE SUPPORT)
PREFILLED_SYRINGE | INTRAVENOUS | Status: DC | PRN
  Administered 2021-03-13 (×2): 80 ug via INTRAVENOUS

## 2021-03-13 MED ORDER — LIDOCAINE 2% (20 MG/ML) 5 ML SYRINGE
INTRAMUSCULAR | Status: DC | PRN
  Administered 2021-03-13: 60 mg via INTRAVENOUS

## 2021-03-13 MED ORDER — ACETAMINOPHEN 325 MG PO TABS
650.0000 mg | ORAL_TABLET | ORAL | Status: DC | PRN
  Administered 2021-03-13 – 2021-03-15 (×4): 650 mg via ORAL
  Filled 2021-03-13 (×4): qty 2

## 2021-03-13 MED ORDER — DEXAMETHASONE SODIUM PHOSPHATE 10 MG/ML IJ SOLN
INTRAMUSCULAR | Status: DC | PRN
  Administered 2021-03-13: 10 mg via INTRAVENOUS

## 2021-03-13 MED ORDER — PROPOFOL 500 MG/50ML IV EMUL
INTRAVENOUS | Status: DC | PRN
  Administered 2021-03-13: 25 ug/kg/min via INTRAVENOUS

## 2021-03-13 MED ORDER — POLYETHYLENE GLYCOL 3350 17 G PO PACK: 17.0000 g | PACK | Freq: Every day | ORAL | Status: DC | PRN

## 2021-03-13 MED ORDER — METHOCARBAMOL 500 MG PO TABS: 500.0000 mg | ORAL_TABLET | Freq: Three times a day (TID) | ORAL | 0 refills | Status: AC | PRN

## 2021-03-13 MED ORDER — MIDAZOLAM HCL 2 MG/2ML IJ SOLN
INTRAMUSCULAR | Status: AC
  Filled 2021-03-13: qty 2

## 2021-03-13 MED ORDER — FLEET ENEMA 7-19 GM/118ML RE ENEM: 1.0000 | ENEMA | Freq: Once | RECTAL | Status: DC | PRN

## 2021-03-13 MED ORDER — OXYCODONE HCL 5 MG PO TABS
5.0000 mg | ORAL_TABLET | ORAL | Status: DC | PRN
  Filled 2021-03-13 (×2): qty 1

## 2021-03-13 MED ORDER — SUCCINYLCHOLINE CHLORIDE 200 MG/10ML IV SOSY
PREFILLED_SYRINGE | INTRAVENOUS | Status: DC | PRN
  Administered 2021-03-13: 140 mg via INTRAVENOUS

## 2021-03-13 MED ORDER — GABAPENTIN 300 MG PO CAPS
300.0000 mg | ORAL_CAPSULE | Freq: Three times a day (TID) | ORAL | Status: DC
  Administered 2021-03-13 – 2021-03-15 (×6): 300 mg via ORAL
  Filled 2021-03-13 (×6): qty 1

## 2021-03-13 MED ORDER — EPHEDRINE SULFATE-NACL 50-0.9 MG/10ML-% IV SOSY
PREFILLED_SYRINGE | INTRAVENOUS | Status: DC | PRN
  Administered 2021-03-13 (×3): 10 mg via INTRAVENOUS
  Administered 2021-03-13 (×2): 5 mg via INTRAVENOUS

## 2021-03-13 MED ORDER — GLYCOPYRROLATE PF 0.2 MG/ML IJ SOSY
PREFILLED_SYRINGE | INTRAMUSCULAR | Status: DC | PRN
  Administered 2021-03-13: .2 mg via INTRAVENOUS

## 2021-03-13 MED ORDER — SODIUM CHLORIDE 0.9 % IV SOLN: 250.0000 mL | INTRAVENOUS | Status: DC

## 2021-03-13 MED ORDER — BACITRACIN-NEOMYCIN-POLYMYXIN OINTMENT TUBE
TOPICAL_OINTMENT | CUTANEOUS | Status: AC
  Filled 2021-03-13: qty 14.17

## 2021-03-13 MED ORDER — SORBITOL 70 % SOLN
30.0000 mL | Freq: Once | Status: AC
  Administered 2021-03-13: 30 mL via ORAL

## 2021-03-13 SURGICAL SUPPLY — 76 items
AGENT HMST KT MTR STRL THRMB (HEMOSTASIS) ×1
BAG COUNTER SPONGE SURGICOUNT (BAG) ×3 IMPLANT
BAG SPNG CNTER NS LX DISP (BAG) ×1
BLADE CLIPPER SURG (BLADE) IMPLANT
BUR EGG ELITE 4.0 (BURR) IMPLANT
CABLE BIPOLOR RESECTION CORD (MISCELLANEOUS) ×3 IMPLANT
CLIP NEUROVISION LG (CLIP) ×1 IMPLANT
CLSR STERI-STRIP ANTIMIC 1/2X4 (GAUZE/BANDAGES/DRESSINGS) ×2 IMPLANT
COVER MAYO STAND STRL (DRAPES) ×6 IMPLANT
COVER SURGICAL LIGHT HANDLE (MISCELLANEOUS) ×3 IMPLANT
DRAIN CHANNEL 15F RND FF W/TCR (WOUND CARE) IMPLANT
DRAPE C-ARM 42X72 X-RAY (DRAPES) ×5 IMPLANT
DRAPE C-ARMOR (DRAPES) ×3 IMPLANT
DRAPE POUCH INSTRU U-SHP 10X18 (DRAPES) ×3 IMPLANT
DRAPE SURG 17X23 STRL (DRAPES) ×3 IMPLANT
DRAPE U-SHAPE 47X51 STRL (DRAPES) ×3 IMPLANT
DRSG OPSITE POSTOP 4X6 (GAUZE/BANDAGES/DRESSINGS) ×2 IMPLANT
DRSG OPSITE POSTOP 4X8 (GAUZE/BANDAGES/DRESSINGS) ×3 IMPLANT
DURAPREP 26ML APPLICATOR (WOUND CARE) ×3 IMPLANT
ELECT BLADE 4.0 EZ CLEAN MEGAD (MISCELLANEOUS)
ELECT BLADE 6.5 EXT (BLADE) ×2 IMPLANT
ELECT CAUTERY BLADE 6.4 (BLADE) ×3 IMPLANT
ELECT PENCIL ROCKER SW 15FT (MISCELLANEOUS) ×3 IMPLANT
ELECT REM PT RETURN 9FT ADLT (ELECTROSURGICAL) ×2
ELECTRODE BLDE 4.0 EZ CLN MEGD (MISCELLANEOUS) IMPLANT
ELECTRODE REM PT RTRN 9FT ADLT (ELECTROSURGICAL) ×2 IMPLANT
GLOVE SURG MICRO LTX SZ8.5 (GLOVE) ×3 IMPLANT
GLOVE SURG UNDER POLY LF SZ8.5 (GLOVE) ×3 IMPLANT
GOWN STRL REUS W/ TWL LRG LVL3 (GOWN DISPOSABLE) ×2 IMPLANT
GOWN STRL REUS W/TWL 2XL LVL3 (GOWN DISPOSABLE) ×6 IMPLANT
GOWN STRL REUS W/TWL LRG LVL3 (GOWN DISPOSABLE) ×2
GUIDEWIRE NITINOL BEVEL TIP (WIRE) ×8 IMPLANT
KIT BASIN OR (CUSTOM PROCEDURE TRAY) ×3 IMPLANT
KIT POSITION SURG JACKSON T1 (MISCELLANEOUS) ×3 IMPLANT
KIT TURNOVER KIT B (KITS) ×3 IMPLANT
MODULE EMG NDL SSEP NVM5 (NEEDLE) IMPLANT
MODULE EMG NEEDLE SSEP NVM5 (NEEDLE) ×2 IMPLANT
MODULE NVM5 NEXT GEN EMG (NEEDLE) ×1 IMPLANT
NDL SPNL 18GX3.5 QUINCKE PK (NEEDLE) ×2 IMPLANT
NEEDLE 22X1 1/2 (OR ONLY) (NEEDLE) ×3 IMPLANT
NEEDLE SPNL 18GX3.5 QUINCKE PK (NEEDLE) ×2 IMPLANT
NS IRRIG 1000ML POUR BTL (IV SOLUTION) ×3 IMPLANT
PACK LAMINECTOMY ORTHO (CUSTOM PROCEDURE TRAY) ×3 IMPLANT
PACK UNIVERSAL I (CUSTOM PROCEDURE TRAY) ×3 IMPLANT
PAD ARMBOARD 7.5X6 YLW CONV (MISCELLANEOUS) ×7 IMPLANT
PATTIES SURGICAL .5 X.5 (GAUZE/BANDAGES/DRESSINGS) IMPLANT
PATTIES SURGICAL .5 X1 (DISPOSABLE) ×2 IMPLANT
POSITIONER HEAD PRONE TRACH (MISCELLANEOUS) ×3 IMPLANT
PROBE BALL TIP NVM5 SNG USE (BALLOONS) ×1 IMPLANT
ROD RELINE MAS LORD 5.5X100MM (Rod) ×1 IMPLANT
ROD RELINE MAS LORD 5.5X90MM (Rod) ×1 IMPLANT
SCREW LOCK RELINE 5.5 TULIP (Screw) ×8 IMPLANT
SCREW MAS RED RELINE 6.5X55 (Screw) ×1 IMPLANT
SCREW RELINE MAS RED 5.5X45MM (Screw) ×1 IMPLANT
SCREW RELINE RED 5.5X50MM (Screw) ×1 IMPLANT
SCREW RELINE RED 6.5X45MM POLY (Screw) ×1 IMPLANT
SCREW RELINE RED 6.5X50MM POLY (Screw) ×4 IMPLANT
SPONGE SURGIFOAM ABS GEL 100 (HEMOSTASIS) ×3 IMPLANT
SPONGE T-LAP 18X18 ~~LOC~~+RFID (SPONGE) ×1 IMPLANT
SPONGE T-LAP 4X18 ~~LOC~~+RFID (SPONGE) ×6 IMPLANT
STRIP CLOSURE SKIN 1/2X4 (GAUZE/BANDAGES/DRESSINGS) ×2 IMPLANT
SURGIFLO W/THROMBIN 8M KIT (HEMOSTASIS) ×1 IMPLANT
SUT BONE WAX W31G (SUTURE) ×3 IMPLANT
SUT MNCRL AB 3-0 PS2 18 (SUTURE) ×4 IMPLANT
SUT VIC AB 1 CT1 18XCR BRD 8 (SUTURE) ×2 IMPLANT
SUT VIC AB 1 CT1 8-18 (SUTURE) ×4
SUT VIC AB 1 CTX 36 (SUTURE)
SUT VIC AB 1 CTX36XBRD ANBCTR (SUTURE) IMPLANT
SUT VIC AB 2-0 CT1 18 (SUTURE) ×4 IMPLANT
SYR BULB IRRIG 60ML STRL (SYRINGE) ×3 IMPLANT
SYR CONTROL 10ML LL (SYRINGE) ×3 IMPLANT
TOWEL GREEN STERILE (TOWEL DISPOSABLE) ×3 IMPLANT
TOWEL GREEN STERILE FF (TOWEL DISPOSABLE) ×3 IMPLANT
TRAY FOLEY MTR SLVR 16FR STAT (SET/KITS/TRAYS/PACK) ×2 IMPLANT
WATER STERILE IRR 1000ML POUR (IV SOLUTION) ×2 IMPLANT
YANKAUER SUCT BULB TIP NO VENT (SUCTIONS) ×3 IMPLANT

## 2021-03-13 NOTE — Progress Notes (Signed)
[facility] consult progress note                                                                              Patient Demographics  Vernon Bishop, is a 80 y.o. male, DOB - 11-23-1940, GDJ:[id number]  Admit date - 03/12/2021   Admitting Physician Melina Schools, MD  Outpatient Primary MD for the patient is [name], [name], MD  Outpatient specialists:   LOS - 1  days   Medical records reviewed and are as summarized below:    No chief complaint on file.      Brief summary   Patient is a 79 year old male with chronic back pain, HTN, A. fib, not on AC, bradycardia, OSA on CPAP, BPH admitted for lumbar fusion surgery. Patient was seen preoperatively by cardiology on 10/26 and was cleared for surgery.  [facility] hospitalist service was consulted for medical management of the comorbidities.   Assessment & Plan    Principal Problem: Chronic back pain S/P lumbar fusion - S/p OLIF L4-5, XLIF L2-4 on 03/12/21 -Underwent posterior spinal fusion with pedicle screw instrumentation L2-5 on 12/15   Active Problems:  Atrial fibrillation ([place]) -In the remote past, not on anticoagulation.  Per outpatient [facility] cardiology, they would like to pursue 2-week cardiac telemetry monitor after spinal surgery to decide on anticoagulation -Currently no acute issues, recommend outpatient follow-up with cardiology after discharge    Bradycardia, chronic -Asymptomatic -On home propranolol 80 mg 3 times daily, will continue  Essential hypertension -BP currently stable, mildly elevated at times due to surgery, pain -Continue current management, added as needed hydralazine if needed with parameters    Pulmonary hypertension, unspecified ([place]) -Last echo 10/22, moderate left atrial dilatation, G1 DD, EF 55 to 60%, moderate TR, estimated RVSP 47 mmHg -Outpatient follow-up with cardiology    OSA on CPAP -Continue CPAP  Code Status: Full code DVT Prophylaxis:  SCD's Start:  03/12/21 2007   Level of Care: Level of care: Med-Surg Family Communication: Discussed all imaging results, lab results, explained to the patient    Time Spent in minutes   25 minutes  Antimicrobials:   Anti-infectives (From admission, onward)    Start     Dose/Rate Route Frequency Ordered Stop   03/13/21 0731  ceFAZolin (ANCEF) 2-4 GM/100ML-% IVPB       Note to Pharmacy: Granville Lewis, Lindsi R: cabinet override      03/13/21 0731 03/13/21 1944   03/13/21 0000  [MAR Hold]  ceFAZolin (ANCEF) IVPB 1 g/50 mL premix        (MAR Hold since Thu 03/13/2021 at [place].Hold Reason: Transfer to a Procedural area)   1 g 100 mL/hr over 30 Minutes Intravenous Every 8 hours 03/12/21 2006 03/13/21 1210   03/12/21 1153  ceFAZolin (ANCEF) IVPB 2g/100 mL premix        2 g 200 mL/hr over 30 Minutes Intravenous 30 min pre-op 03/12/21 1153 03/12/21 1729          Medications  Scheduled Meds:  [MAR Hold] amLODipine  5 mg Oral Daily   fentaNYL       fentaNYL       [MAR  Hold] FLUoxetine  40 mg Oral Daily   [MAR Hold] furosemide  20 mg Oral Daily   oxyCODONE       [MAR Hold] polyethylene glycol  17 g Oral QODAY   [MAR Hold] propranolol  80 mg Oral TID   [MAR Hold] sodium chloride flush  3 mL Intravenous Q12H   Continuous Infusions:  sodium chloride     ceFAZolin     lactated ringers     [MAR Hold] methocarbamol (ROBAXIN) IV     PRN Meds:.[MAR Hold] acetaminophen **OR** [MAR Hold] acetaminophen, fentaNYL (SUBLIMAZE) injection, [MAR Hold]  HYDROmorphone (DILAUDID) injection, [MAR Hold] menthol-cetylpyridinium **OR** [MAR Hold] phenol, [MAR Hold] methocarbamol **OR** [MAR Hold] methocarbamol (ROBAXIN) IV, [MAR Hold] ondansetron **OR** [MAR Hold] ondansetron (ZOFRAN) IV, [MAR Hold] oxyCODONE, [MAR Hold] oxyCODONE, [MAR Hold] sodium chloride flush, [MAR Hold] sodium phosphate      Subjective:   Vernon Bishop was seen and examined today.  Seen in PACU, alert and oriented, no acute complaints.  No  neurological deficits.  Patient denies dizziness, chest pain, shortness of breath, abdominal pain, N/V.  No fevers Objective:   Vitals:   03/13/21 1155 03/13/21 1210 03/13/21 1225 03/13/21 1240  BP: (!) 149/65 (!) 157/64 129/88 (!) 142/57  Pulse: 64 62 (!) 58 63  Resp: 19 (!) 32 20 17  Temp: 98 F (36.7 C)     TempSrc:      SpO2: 100% 100% 100% 94%  Weight:      Height:        Intake/Output Summary (Last 24 hours) at 03/13/2021 1301 Last data filed at 03/13/2021 1155 Gross per 24 hour  Intake 3700 ml  Output 3105 ml  Net 595 ml     Wt Readings from Last 3 Encounters:  03/12/21 95.3 kg  03/10/21 95.3 kg  03/03/21 94.6 kg     Exam General: Alert and oriented x 3, NAD Cardiovascular: S1 S2 auscultated, RRR Respiratory: Clear to auscultation bilaterally, no wheezing Gastrointestinal: Soft, nontender, nondistended, + bowel sounds Ext: no pedal edema bilaterally Neuro: no new deficits Psych: Normal affect and demeanor, alert and oriented x3    Data Reviewed:  I have personally reviewed following labs and imaging studies  Micro Results Recent Results (from the past 240 hour(s))  SARS CORONAVIRUS 2 (TAT 6-24 HRS) Nasopharyngeal Nasopharyngeal Swab     Status: None   Collection Time: 03/10/21 11:03 AM   Specimen: Nasopharyngeal Swab  Result Value Ref Range Status   SARS Coronavirus 2 NEGATIVE NEGATIVE Final    Comment: (NOTE) SARS-CoV-2 target nucleic acids are NOT DETECTED.  The SARS-CoV-2 RNA is generally detectable in upper and lower respiratory specimens during the acute phase of infection. Negative results do not preclude SARS-CoV-2 infection, do not rule out co-infections with other pathogens, and should not be used as the sole basis for treatment or other patient management decisions. Negative results must be combined with clinical observations, patient history, and epidemiological information. The expected result is Negative.  Fact Sheet for  Patients: SugarRoll.be  Fact Sheet for Healthcare Providers: https://www.woods-mathews.com/  This test is not yet approved or cleared by the [place] FDA and  has been authorized for detection and/or diagnosis of SARS-CoV-2 by FDA under an Emergency Use Authorization (EUA). This EUA will remain  in effect (meaning this test can be used) for the duration of the COVID-19 declaration under Se ction 564(b)(1) of the Act, 21 U.S.C. section 360bbb-3(b)(1), unless the authorization is terminated or revoked sooner.  Performed at  [facility], [place] [address] [place], [place] [address]   Surgical pcr screen     Status: Abnormal   Collection Time: 03/10/21 11:06 AM   Specimen: Nasal Mucosa; Nasal Swab  Result Value Ref Range Status   MRSA, PCR NEGATIVE NEGATIVE Final   Staphylococcus aureus POSITIVE (A) NEGATIVE Final    Comment: (NOTE) The Xpert SA Assay (FDA approved for NASAL specimens in patients 65 years of age and older), is one component of a comprehensive surveillance program. It is not intended to diagnose infection nor to guide or monitor treatment. Performed at [facility], [place] [address], [place], [place] [address]     Radiology Reports DG Lumbar Spine 2-3 Views  Result Date: 03/13/2021 CLINICAL DATA:  Surgical posterior fusion extending from L2-L5. EXAM: LUMBAR SPINE - 2-3 VIEW; DG C-ARM 1-60 MIN-NO REPORT Radiation exposure index: 330.15 mGy. COMPARISON:  February 01, 2019. FINDINGS: Five intraoperative fluoroscopic images were obtained of the lumbar spine. These demonstrate the patient be status post surgical posterior fusion of L2-3, L3-4, and L4-5 with bilateral intrapedicular screw placement and interbody fusion. IMPRESSION: Fluoroscopic guidance provided during lower lumbar surgery. Electronically Signed   By: Marijo Conception M.D.   On: 03/13/2021 12:47   DG C-Arm 1-60 Min-No Report  Result Date:  03/13/2021 Fluoroscopy was utilized by the requesting physician.  No radiographic interpretation.   DG C-Arm 1-60 Min-No Report  Result Date: 03/13/2021 Fluoroscopy was utilized by the requesting physician.  No radiographic interpretation.   DG C-Arm 1-60 Min-No Report  Result Date: 03/13/2021 Fluoroscopy was utilized by the requesting physician.  No radiographic interpretation.   DG C-Arm 1-60 Min-No Report  Result Date: 03/12/2021 Fluoroscopy was utilized by the requesting physician.  No radiographic interpretation.   DG C-Arm 1-60 Min-No Report  Result Date: 03/12/2021 Fluoroscopy was utilized by the requesting physician.  No radiographic interpretation.   DG C-Arm 1-60 Min-No Report  Result Date: 03/12/2021 Fluoroscopy was utilized by the requesting physician.  No radiographic interpretation.   DG C-Arm 1-60 Min-No Report  Result Date: 03/12/2021 Fluoroscopy was utilized by the requesting physician.  No radiographic interpretation.   DG C-Arm 1-60 Min-No Report  Result Date: 03/12/2021 Fluoroscopy was utilized by the requesting physician.  No radiographic interpretation.   DG OR LOCAL ABDOMEN  Result Date: 03/12/2021 CLINICAL DATA:  Status post surgical lumbar fusion. EXAM: OR LOCAL ABDOMEN COMPARISON:  None. FINDINGS: The patient appears to be status post surgical fusion of L2-3, L3-4 and L4-5. Two vascular clips are seen projected over the sacrum. Status post bilateral total hip arthroplasty. No other radiopaque foreign body is noted. IMPRESSION: Status post surgical posterior fusion of multiple levels of the lower lumbar spine. 2 vascular clips are seen over sacrum. No other radiopaque foreign body is noted. These results were called by telephone at the time of interpretation on 03/12/2021 at 6:18 pm to provider Gordon in OR 4, who verbally acknowledged these results. Electronically Signed   By: Marijo Conception M.D.   On: 03/12/2021 18:18    Lab Data:  CBC: Recent  Labs  Lab 03/10/21 1132  WBC 7.9  HGB 14.1  HCT 41.5  MCV 90.6  PLT 875   Basic Metabolic Panel: Recent Labs  Lab 03/10/21 1132  NA 137  K 4.0  CL 104  CO2 26  GLUCOSE 93  BUN 20  CREATININE 0.96  CALCIUM 9.1   GFR: Estimated Creatinine Clearance: 73.5 mL/min (by C-G formula based  on SCr of 0.96 mg/dL). Liver Function Tests: No results for input(s): AST, ALT, ALKPHOS, BILITOT, PROT, ALBUMIN in the last 168 hours. No results for input(s): LIPASE, AMYLASE in the last 168 hours. No results for input(s): AMMONIA in the last 168 hours. Coagulation Profile: No results for input(s): INR, PROTIME in the last 168 hours. Cardiac Enzymes: No results for input(s): CKTOTAL, CKMB, CKMBINDEX, TROPONINI in the last 168 hours. BNP (last 3 results) No results for input(s): PROBNP in the last 8760 hours. HbA1C: No results for input(s): HGBA1C in the last 72 hours. CBG: Recent Labs  Lab 03/12/21 1154 03/12/21 1854 03/12/21 2110 03/13/21 0613  GLUCAP 99 104* 110* 118*   Lipid Profile: No results for input(s): CHOL, HDL, LDLCALC, TRIG, CHOLHDL, LDLDIRECT in the last 72 hours. Thyroid Function Tests: No results for input(s): TSH, T4TOTAL, FREET4, T3FREE, THYROIDAB in the last 72 hours. Anemia Panel: No results for input(s): VITAMINB12, FOLATE, FERRITIN, TIBC, IRON, RETICCTPCT in the last 72 hours. Urine analysis:    Component Value Date/Time   COLORURINE YELLOW 03/10/2021 [place] 03/10/2021 1132   LABSPEC 1.015 03/10/2021 1132   PHURINE 6.0 03/10/2021 1132   GLUCOSEU NEGATIVE 03/10/2021 1132   GLUCOSEU NEGATIVE 04/05/2019 1446   HGBUR NEGATIVE 03/10/2021 1132   BILIRUBINUR NEGATIVE 03/10/2021 1132   KETONESUR NEGATIVE 03/10/2021 1132   PROTEINUR NEGATIVE 03/10/2021 1132   UROBILINOGEN 0.2 04/05/2019 1446   NITRITE NEGATIVE 03/10/2021 1132   LEUKOCYTESUR NEGATIVE 03/10/2021 1132     Ikran Patman M.D. [facility] 03/13/2021, 1:01 PM  Available  via Epic secure chat 7am-7pm After 7 pm, please refer to night coverage provider listed on amion.

## 2021-03-13 NOTE — Discharge Instructions (Signed)

## 2021-03-13 NOTE — Op Note (Signed)
OPERATIVE REPORT  DATE OF SURGERY: 03/13/2021  PATIENT NAME:  Vernon Bishop MRN: [id number] DOB: February 10, 1941  PCP: Cassandria Anger, MD  PRE-OPERATIVE DIAGNOSIS: Status post L2-5 lateral interbody fusion for multilevel degenerative disc disease with mild degenerative scoliosis.  Postlaminectomy syndrome.Marland Kitchen  POST-OPERATIVE DIAGNOSIS: Same  PROCEDURE:   Posterior spinal fusion with pedicle screw instrumentation L2-5.  SURGEON:  Melina Schools, MD  PHYSICIAN ASSISTANT: Nelson Chimes, PA  ANESTHESIA:   General  EBL: 50 ml   Complications: None  Implants: Nu[employer]asive MIS pedicle screws.  Right side: L2 5.5 x 45 mm length.  L4: 6.5 x 50 mm length.  L5: 6.5 x 50 mm length.  Right L3 pedicle screw was removed as it was not allowing for proper positioning of the rod.  Left side: L2: 5.5 x 50 mm length.  L3: 6.5 x 55 mm length.  L4: 6.5 x 50 mm length.  L5: 6.5 x 45 mm length.  Left side: 90 mm length rod except with 4 locking caps.  Right side: 100 mm length rod affixed with 3 locking caps.  Neuro monitoring: All 7 screws were directly stimulated and there was no adverse activity at greater than 40 mA.  Normal SSEP and free running EMG activity throughout the case.  BRIEF HISTORY: Vernon Bishop is a 80 y.o. male who had a previous lumbar decompression and has ongoing severe back buttock and neuropathic leg pain.  Attempts at conservative management had failed to alleviate his symptoms and his quality of life deteriorated.  As a result we elected to move forward with surgery.  All appropriate risks, benefits, and alternatives were discussed with patient and consent was obtained.  The patient presents today for planned posterior supplemental pedicle screw fixation.  PROCEDURE DETAILS: Patient was brought into the operating room and was properly positioned on the operating room table.  After induction with general anesthesia the patient was endotracheally intubated.  A timeout was taken to  confirm all important data: including patient, procedure, and the level. Teds, SCD's were applied.   Patient was turned prone onto the [facility] spine frame and all bony prominences were well-padded.  The back was then prepped and draped in the standard fashion.  Using fluoroscopy identified the lateral border of the L2, 3, 4, and 5 pedicles.  I marked out incision site and infiltrated with quarter percent Marcaine with epinephrine.  This was done on the contralateral side as well.  Small incision was made over the lateral aspect of the L2 pedicle and a Jamshidi needle was advanced percutaneously down to the lateral aspect of the pedicle.  Once I confirmed satisfactory positioning in the AP plane I then connected the Jamshidi needle to the neuro monitoring device and advanced the Jamshidi needle into the pedicle.  As I neared the medial wall of the pedicle I switched to the lateral view.  I confirmed that I was just beyond the posterior wall of the vertebral body confirming my trajectory.  I then advanced into the vertebral body.  I then placed a guidepin to cannulate the pedicle I repeated this exact same technique at the L3, L4, and L5 pedicles.  Once all 4 pedicles were cannulated I then went to the contralateral side and using the same technique I cannulated all 4 pedicles.  Imaging confirmed satisfactory position of all the guidepins.  I then measured and then placed the left pedicle screws.  The appropriate size screw was placed over the guidepin and advanced using fluoroscopic guidance  and neuro monitoring.  The remaining screws were then placed.  I then went to the right side and placed all 4 screws.  All 8 pedicle screws were then stimulated and there was no adverse activity at greater than 40 mA.  At this point the right L3 pedicle screw noted to be malpositioned and not in line with the remainder of the construct.  I felt as though quite difficult to pass the rod.  As result I elected to remove this  pedicle screw.  I then measured the rod length and passed the rods on both sides.  I confirmed that the rods were properly seated and then secured with a locking caps.  All 7 locking caps were then tightened and torqued according manufacture standards.  With the construct now secured I remove the insertion tabs and irrigated the wounds copiously with normal saline.  I made sure hemostasis using bipolar cautery and Floseal.  After an additional washout I then closed the wounds in a layered fashion with interrupted #1 Vicryl suture, 2-0 Vicryl suture, and a 3-0 Monocryl for the skin.  Final x-rays AP and lateral demonstrate satisfactory positioning of the hardware in both planes.  There is no subsidence or change in position of the intervertebral cages and all 7 pedicle screws were properly seated.  Patient was ultimately extubated transfer the PACU without incident.  The end of the case all needle sponge counts were correct.  First Assistant: Nelson Chimes, PA.  She was instrumental in assisting with positioning the patient, retraction for visualization, insertion of hardware, and wound closure.    Melina Schools, MD 03/13/2021 10:35 AM

## 2021-03-13 NOTE — Progress Notes (Signed)
Patient has home CPAP unit for the night

## 2021-03-13 NOTE — Anesthesia Postprocedure Evaluation (Signed)
Anesthesia Post Note  Patient: Vernon Bishop  Procedure(s) Performed: POSTERIOR SPINAL FUSION INSTRUMENTATION LUMBAR TWO THROUGH FIVE (Spine Lumbar)     Patient location during evaluation: PACU Anesthesia Type: General Level of consciousness: awake Pain management: pain level controlled Vital Signs Assessment: post-procedure vital signs reviewed and stable Respiratory status: spontaneous breathing Cardiovascular status: stable Postop Assessment: no apparent nausea or vomiting Anesthetic complications: no   No notable events documented.  Last Vitals:  Vitals:   03/13/21 1309 03/13/21 1636  BP: (!) 154/57 (!) 160/98  Pulse: 64 (!) 55  Resp: 20 18  Temp: 36.7 C 36.9 C  SpO2: 95% 99%    Last Pain:  Vitals:   03/13/21 1636  TempSrc: Oral  PainSc:                  [name]

## 2021-03-13 NOTE — Progress Notes (Signed)
° ° °  Subjective: Procedure(s) (LRB): POSTERIOR LUMBAR FUSION 3 LEVEL (POSTERIOR SPINAL FUSION INTERBODY L2-5, POSSIBLE DECOMPRESSION) (N/A) Day of Surgery  Patient reports pain as 2 on 0-10 scale.  Reports decreased leg pain reports incisional back pain   N/A void - foley in place Negative bowel movement Positive flatus Negative chest pain or shortness of breath  Objective: Vital signs in last 24 hours: Temp:  [97.3 F (36.3 C)-98.9 F (37.2 C)] 98.2 F (36.8 C) (12/15 0432) Pulse Rate:  [46-60] 59 (12/15 0432) Resp:  [12-18] 18 (12/15 0432) BP: (116-160)/(42-85) 150/62 (12/15 0432) SpO2:  [93 %-100 %] 100 % (12/15 0432) Weight:  [95.3 kg] 95.3 kg (12/14 1151)  Intake/Output from previous day: 12/14 0701 - 12/15 0700 In: 2100 [I.V.:2000; IV Piggyback:100] Out: 2455 [Urine:2205; Blood:250]  Labs: Recent Labs    03/10/21 1132  WBC 7.9  RBC 4.58  HCT 41.5  PLT 211   Recent Labs    03/10/21 1132  NA 137  K 4.0  CL 104  CO2 26  BUN 20  CREATININE 0.96  GLUCOSE 93  CALCIUM 9.1   No results for input(s): LABPT, INR in the last 72 hours.  Physical Exam: Neurologically intact ABD soft Intact pulses distally Incision: dressing C/D/I No cellulitis present Body mass index is 28.48 kg/m.   Assessment/Plan: Patient stable  Patient reports improvement in preoperative neuropathic leg pain. He is able to stand and ambulate in the room yesterday.  His primary complaint is incisional left flank/groin plain. Overall he is doing exceptionally well.  Since his preoperative neuropathic leg pain has improved we will move forward with supplemental posterior fixation only.  I do not believe supplemental decompression is going to be required at this time.   Melina Schools, MD [facility] [phone]

## 2021-03-13 NOTE — Anesthesia Procedure Notes (Signed)
Procedure Name: Intubation Date/Time: 03/13/2021 7:49 AM Performed by: Michele Rockers, CRNA Pre-anesthesia Checklist: Patient identified, Patient being monitored, Timeout performed, Emergency Drugs available and Suction available Patient Re-evaluated:Patient Re-evaluated prior to induction Oxygen Delivery Method: Circle system utilized Preoxygenation: Pre-oxygenation with 100% oxygen Induction Type: IV induction Ventilation: Mask ventilation without difficulty Laryngoscope Size: 3 and Glidescope Grade View: Grade I Tube type: Oral Tube size: 7.5 mm Number of attempts: 1 Airway Equipment and Method: Stylet Placement Confirmation: ETT inserted through vocal cords under direct vision, positive ETCO2 and breath sounds checked- equal and bilateral Secured at: 23 cm Tube secured with: Tape Dental Injury: Teeth and Oropharynx as per pre-operative assessment  Difficulty Due To: Difficult Airway- due to limited oral opening and Difficult Airway- due to reduced neck mobility

## 2021-03-13 NOTE — Transfer of Care (Signed)
Immediate Anesthesia Transfer of Care Note  Patient: Vernon Bishop  Procedure(s) Performed: POSTERIOR SPINAL FUSION INSTRUMENTATION LUMBAR TWO THROUGH FIVE (Spine Lumbar)  Patient Location: PACU  Anesthesia Type:General  Level of Consciousness: awake, patient cooperative and responds to stimulation  Airway & Oxygen Therapy: Patient Spontanous Breathing and Patient connected to face mask oxygen  Post-op Assessment: Report given to RN, Post -op Vital signs reviewed and stable and Patient moving all extremities X 4  Post vital signs: Reviewed and stable  Last Vitals:  Vitals Value Taken Time  BP 149/65 03/13/21 1155  Temp    Pulse    Resp 18 03/13/21 1158  SpO2    Vitals shown include unvalidated device data.  Last Pain:  Vitals:   03/13/21 0516  TempSrc:   PainSc: 3       Patients Stated Pain Goal: 2 (38/[id number])  Complications: No notable events documented.

## 2021-03-13 NOTE — Anesthesia Preprocedure Evaluation (Signed)
Anesthesia Evaluation  Patient identified by MRN, date of birth, ID band Patient awake    Reviewed: Allergy & Precautions, NPO status , Patient's Chart, lab work & pertinent test results  Airway Mallampati: II  TM Distance: >3 FB     Dental   Pulmonary sleep apnea , former smoker,    breath sounds clear to auscultation       Cardiovascular hypertension,  Rhythm:Regular Rate:Normal     Neuro/Psych Seizures -,   Neuromuscular disease    GI/Hepatic negative GI ROS,   Endo/Other  diabetes  Renal/GU negative Renal ROS     Musculoskeletal   Abdominal   Peds  Hematology   Anesthesia Other Findings   Reproductive/Obstetrics                             Anesthesia Physical Anesthesia Plan  ASA: 3  Anesthesia Plan: General   Post-op Pain Management:    Induction: Intravenous  PONV Risk Score and Plan: 2 and Ondansetron, Dexamethasone and Midazolam  Airway Management Planned: Oral ETT  Additional Equipment:   Intra-op Plan:   Post-operative Plan: Possible Post-op intubation/ventilation  Informed Consent: I have reviewed the patients History and Physical, chart, labs and discussed the procedure including the risks, benefits and alternatives for the proposed anesthesia with the patient or authorized representative who has indicated his/her understanding and acceptance.     Dental advisory given  Plan Discussed with: CRNA and Anesthesiologist  Anesthesia Plan Comments:         Anesthesia Quick Evaluation

## 2021-03-14 LAB — COMPREHENSIVE METABOLIC PANEL
ALT: 15 U/L (ref 0–44)
AST: 32 U/L (ref 15–41)
Albumin: 2.7 g/dL — ABNORMAL LOW (ref 3.5–5.0)
Alkaline Phosphatase: 52 U/L (ref 38–126)
Anion gap: 8 (ref 5–15)
BUN: 13 mg/dL (ref 8–23)
CO2: 25 mmol/L (ref 22–32)
Calcium: 8.3 mg/dL — ABNORMAL LOW (ref 8.9–10.3)
Chloride: 100 mmol/L (ref 98–111)
Creatinine, Ser: 0.89 mg/dL (ref 0.61–1.24)
GFR, Estimated: >60 mL/min (ref >60)
Glucose, Bld: 100 mg/dL — ABNORMAL HIGH (ref 70–99)
Potassium: 3.8 mmol/L (ref 3.5–5.1)
Sodium: 133 mmol/L — ABNORMAL LOW (ref 135–145)
Total Bilirubin: 0.8 mg/dL (ref 0.3–1.2)
Total Protein: 4.8 g/dL — ABNORMAL LOW (ref 6.5–8.1)

## 2021-03-14 LAB — CBC
HCT: 26.4 % — ABNORMAL LOW (ref 39.0–52.0)
Hemoglobin: 8.9 g/dL — ABNORMAL LOW (ref 13.0–17.0)
MCH: 31.2 pg (ref 26.0–34.0)
MCHC: 33.7 g/dL (ref 30.0–36.0)
MCV: 92.6 fL (ref 80.0–100.0)
Platelets: 140 10*3/uL — ABNORMAL LOW (ref 150–400)
RBC: 2.85 MIL/uL — ABNORMAL LOW (ref 4.22–5.81)
RDW: 14.2 % (ref 11.5–15.5)
WBC: 7.9 10*3/uL (ref 4.0–10.5)
nRBC: 0 % (ref 0.0–0.2)

## 2021-03-14 MED ORDER — DOCUSATE SODIUM 100 MG PO CAPS: 100.0000 mg | ORAL_CAPSULE | Freq: Two times a day (BID) | ORAL | Status: DC

## 2021-03-14 MED ORDER — BISACODYL 10 MG RE SUPP
10.0000 mg | Freq: Once | RECTAL | Status: AC
Start: 2021-03-14 — End: 2021-03-14
  Administered 2021-03-14: 10 mg via RECTAL
  Filled 2021-03-14: qty 1

## 2021-03-14 MED ORDER — POLYETHYLENE GLYCOL 3350 17 G PO PACK
17.0000 g | PACK | Freq: Every day | ORAL | Status: DC
Start: 2021-03-14 — End: 2021-03-15
  Administered 2021-03-14 – 2021-03-15 (×2): 17 g via ORAL
  Filled 2021-03-14 (×2): qty 1

## 2021-03-14 MED FILL — Heparin Sodium (Porcine) Inj 1000 Unit/ML: INTRAMUSCULAR | Qty: 30 | Status: AC

## 2021-03-14 MED FILL — Sodium Chloride IV Soln 0.9%: INTRAVENOUS | Qty: 1000 | Status: AC

## 2021-03-14 NOTE — Progress Notes (Signed)
[facility] consult progress note                                                                              Patient Demographics  Vernon Bishop, is a 80 y.o. male, DOB - [birth year], FTD:[id number]  Admit date - 03/12/2021   Admitting Physician Melina Schools, MD  Outpatient Primary MD for the patient is [name], [name], MD  Outpatient specialists:   LOS - 2  days   Medical records reviewed and are as summarized below:    No chief complaint on file.      Brief summary   Patient is a 80 year old male with chronic back pain, HTN, A. fib, not on AC, bradycardia, OSA on CPAP, BPH admitted for lumbar fusion surgery. Patient was seen preoperatively by cardiology on 10/26 and was cleared for surgery.  [facility] hospitalist service was consulted for medical management of the comorbidities.   Assessment & Plan    Principal Problem: Chronic back pain S/P lumbar fusion - S/p OLIF L4-5, XLIF L2-4 on 03/12/21 -Underwent posterior spinal fusion with pedicle screw instrumentation L2-5 on 12/15 -Per primary service  Active Problems:  Atrial fibrillation ([place]) -In the remote past, not on anticoagulation.  Per outpatient [facility] cardiology, they would like to pursue 2-week cardiac telemetry monitor after spinal surgery to decide on anticoagulation -No acute issues.  Outpatient follow-up with cardiology.    Bradycardia, chronic -Asymptomatic -Continue propranolol 80 mg  TID, home dose   Essential hypertension -BP stable continue current management     Pulmonary hypertension, unspecified ([place]) -Last echo 10/22, moderate left atrial dilatation, G1 DD, EF 55 to 60%, moderate TR, estimated RVSP 47 mmHg -Outpatient follow-up with cardiology  Constipation Added MiraLAX, Dulcolax suppository, Colace.  Overnight had received sorbitol. Will likely need Fleet Enema if constipation not resolved.    OSA on CPAP -Continue CPAP  Code Status: Full code DVT Prophylaxis:   SCD's Start: 03/13/21 1306 SCD's Start: 03/12/21 2007   Level of Care: Level of care: Med-Surg Family Communication: Discussed all imaging results, lab results, explained to the patient    Time Spent in minutes   25 minutes  Antimicrobials:   Anti-infectives (From admission, onward)    Start     Dose/Rate Route Frequency Ordered Stop   03/13/21 1930  ceFAZolin (ANCEF) IVPB 1 g/50 mL premix        1 g 100 mL/hr over 30 Minutes Intravenous Every 8 hours 03/13/21 1305 03/14/21 0407   03/13/21 0731  ceFAZolin (ANCEF) 2-4 GM/100ML-% IVPB       Note to Pharmacy: Granville Lewis, Lindsi R: cabinet override      03/13/21 0731 03/13/21 1944   03/13/21 0000  ceFAZolin (ANCEF) IVPB 1 g/50 mL premix        1 g 100 mL/hr over 30 Minutes Intravenous Every 8 hours 03/12/21 2006 03/13/21 1210   03/12/21 1153  ceFAZolin (ANCEF) IVPB 2g/100 mL premix        2 g 200 mL/hr over 30 Minutes Intravenous 30 min pre-op 03/12/21 1153 03/12/21 1729          Medications  Scheduled Meds:  amLODipine  5 mg Oral Daily   docusate sodium  100 mg Oral BID   FLUoxetine  40 mg Oral Daily   furosemide  20 mg Oral Daily   gabapentin  300 mg Oral TID   polyethylene glycol  17 g Oral Daily   propranolol  80 mg Oral TID   sodium chloride flush  3 mL Intravenous Q12H   Continuous Infusions:  sodium chloride     lactated ringers     methocarbamol (ROBAXIN) IV     PRN Meds:.acetaminophen **OR** acetaminophen, hydrALAZINE, menthol-cetylpyridinium **OR** phenol, methocarbamol **OR** methocarbamol (ROBAXIN) IV, ondansetron **OR** ondansetron (ZOFRAN) IV, oxyCODONE, oxyCODONE, sodium chloride flush, sodium phosphate      Subjective:   Vernon Bishop was seen and examined today.  No acute complaints except constipation.  Otherwise stable.  No acute overnight issues.  No nausea vomiting or abdominal pain.    Objective:   Vitals:   03/14/21 0000 03/14/21 0358 03/14/21 0814 03/14/21 1310  BP: (!) 134/47 (!)  124/52 (!) 116/47 (!) 113/46  Pulse: (!) 54 (!) 56 (!) 53 63  Resp: 20 18 18 18   Temp: 97.8 F (36.6 C) 98.6 F (37 C) 99.1 F (37.3 C) 98.9 F (37.2 C)  TempSrc: Oral Oral Oral Oral  SpO2: 99% 99% 98% 95%  Weight:      Height:       No intake or output data in the 24 hours ending 03/14/21 1339    Wt Readings from Last 3 Encounters:  03/12/21 95.3 kg  03/10/21 95.3 kg  03/03/21 94.6 kg    Physical Exam General: Alert and oriented x 3, NAD Cardiovascular: S1 S2 clear, RRR. No pedal edema b/l Respiratory: CTAB, no wheezing Gastrointestinal: Soft, nontender, nondistended, NBS Ext: no pedal edema bilaterally Neuro: no new deficits   Data Reviewed:  I have personally reviewed following labs and imaging studies  Micro Results Recent Results (from the past 240 hour(s))  SARS CORONAVIRUS 2 (TAT 6-24 HRS) Nasopharyngeal Nasopharyngeal Swab     Status: None   Collection Time: 03/10/21 11:03 AM   Specimen: Nasopharyngeal Swab  Result Value Ref Range Status   SARS Coronavirus 2 NEGATIVE NEGATIVE Final    Comment: (NOTE) SARS-CoV-2 target nucleic acids are NOT DETECTED.  The SARS-CoV-2 RNA is generally detectable in upper and lower respiratory specimens during the acute phase of infection. Negative results do not preclude SARS-CoV-2 infection, do not rule out co-infections with other pathogens, and should not be used as the sole basis for treatment or other patient management decisions. Negative results must be combined with clinical observations, patient history, and epidemiological information. The expected result is Negative.  Fact Sheet for Patients: SugarRoll.be  Fact Sheet for Healthcare Providers: https://www.woods-mathews.com/  This test is not yet approved or cleared by the [place] FDA and  has been authorized for detection and/or diagnosis of SARS-CoV-2 by FDA under an Emergency Use Authorization (EUA). This EUA  will remain  in effect (meaning this test can be used) for the duration of the COVID-19 declaration under Se ction 564(b)(1) of the Act, 21 U.S.C. section 360bbb-3(b)(1), unless the authorization is terminated or revoked sooner.  Performed at [facility], [place] [address] [place], [place] [address]   Surgical pcr screen     Status: Abnormal   Collection Time: 03/10/21 11:06 AM   Specimen: Nasal Mucosa; Nasal Swab  Result Value Ref Range Status   MRSA, PCR NEGATIVE NEGATIVE Final   Staphylococcus aureus POSITIVE (A) NEGATIVE Final  Comment: (NOTE) The Xpert SA Assay (FDA approved for NASAL specimens in patients 77 years of age and older), is one component of a comprehensive surveillance program. It is not intended to diagnose infection nor to guide or monitor treatment. Performed at [facility], [place] [address], [place], [place] [address]     Radiology Reports DG Lumbar Spine 2-3 Views  Result Date: 03/13/2021 CLINICAL DATA:  Surgical posterior fusion extending from L2-L5. EXAM: LUMBAR SPINE - 2-3 VIEW; DG C-ARM 1-60 MIN-NO REPORT Radiation exposure index: 330.15 mGy. COMPARISON:  February 01, 2019. FINDINGS: Five intraoperative fluoroscopic images were obtained of the lumbar spine. These demonstrate the patient be status post surgical posterior fusion of L2-3, L3-4, and L4-5 with bilateral intrapedicular screw placement and interbody fusion. IMPRESSION: Fluoroscopic guidance provided during lower lumbar surgery. Electronically Signed   By: Marijo Conception M.D.   On: 03/13/2021 12:47   DG C-Arm 1-60 Min-No Report  Result Date: 03/13/2021 CLINICAL DATA:  Surgical posterior fusion extending from L2-L5. EXAM: LUMBAR SPINE - 2-3 VIEW; DG C-ARM 1-60 MIN-NO REPORT Radiation exposure index: 330.15 mGy. COMPARISON:  February 01, 2019. FINDINGS: Five intraoperative fluoroscopic images were obtained of the lumbar spine. These demonstrate the patient be status post surgical  posterior fusion of L2-3, L3-4, and L4-5 with bilateral intrapedicular screw placement and interbody fusion. IMPRESSION: Fluoroscopic guidance provided during lower lumbar surgery. Electronically Signed   By: Marijo Conception M.D.   On: 03/13/2021 12:47   DG C-Arm 1-60 Min-No Report  Result Date: 03/13/2021 CLINICAL DATA:  Surgical posterior fusion extending from L2-L5. EXAM: LUMBAR SPINE - 2-3 VIEW; DG C-ARM 1-60 MIN-NO REPORT Radiation exposure index: 330.15 mGy. COMPARISON:  February 01, 2019. FINDINGS: Five intraoperative fluoroscopic images were obtained of the lumbar spine. These demonstrate the patient be status post surgical posterior fusion of L2-3, L3-4, and L4-5 with bilateral intrapedicular screw placement and interbody fusion. IMPRESSION: Fluoroscopic guidance provided during lower lumbar surgery. Electronically Signed   By: Marijo Conception M.D.   On: 03/13/2021 12:47   DG C-Arm 1-60 Min-No Report  Result Date: 03/13/2021 CLINICAL DATA:  Surgical posterior fusion extending from L2-L5. EXAM: LUMBAR SPINE - 2-3 VIEW; DG C-ARM 1-60 MIN-NO REPORT Radiation exposure index: 330.15 mGy. COMPARISON:  February 01, 2019. FINDINGS: Five intraoperative fluoroscopic images were obtained of the lumbar spine. These demonstrate the patient be status post surgical posterior fusion of L2-3, L3-4, and L4-5 with bilateral intrapedicular screw placement and interbody fusion. IMPRESSION: Fluoroscopic guidance provided during lower lumbar surgery. Electronically Signed   By: Marijo Conception M.D.   On: 03/13/2021 12:47   DG C-Arm 1-60 Min-No Report  Result Date: 03/12/2021 Fluoroscopy was utilized by the requesting physician.  No radiographic interpretation.   DG C-Arm 1-60 Min-No Report  Result Date: 03/12/2021 Fluoroscopy was utilized by the requesting physician.  No radiographic interpretation.   DG C-Arm 1-60 Min-No Report  Result Date: 03/12/2021 Fluoroscopy was utilized by the requesting physician.   No radiographic interpretation.   DG C-Arm 1-60 Min-No Report  Result Date: 03/12/2021 Fluoroscopy was utilized by the requesting physician.  No radiographic interpretation.   DG C-Arm 1-60 Min-No Report  Result Date: 03/12/2021 Fluoroscopy was utilized by the requesting physician.  No radiographic interpretation.   DG OR LOCAL ABDOMEN  Result Date: 03/12/2021 CLINICAL DATA:  Status post surgical lumbar fusion. EXAM: OR LOCAL ABDOMEN COMPARISON:  None. FINDINGS: The patient appears to be status post surgical fusion of L2-3, L3-4 and  L4-5. Two vascular clips are seen projected over the sacrum. Status post bilateral total hip arthroplasty. No other radiopaque foreign body is noted. IMPRESSION: Status post surgical posterior fusion of multiple levels of the lower lumbar spine. 2 vascular clips are seen over sacrum. No other radiopaque foreign body is noted. These results were called by telephone at the time of interpretation on 03/12/2021 at 6:18 pm to provider Abrams in OR 4, who verbally acknowledged these results. Electronically Signed   By: Marijo Conception M.D.   On: 03/12/2021 18:18    Lab Data:  CBC: Recent Labs  Lab 03/10/21 1132 03/14/21 0937  WBC 7.9 7.9  HGB 14.1 8.9*  HCT 41.5 26.4*  MCV 90.6 92.6  PLT 211 026*   Basic Metabolic Panel: Recent Labs  Lab 03/10/21 1132 03/14/21 0652  NA 137 133*  K 4.0 3.8  CL 104 100  CO2 26 25  GLUCOSE 93 100*  BUN 20 13  CREATININE 0.96 0.89  CALCIUM 9.1 8.3*   GFR: Estimated Creatinine Clearance: 79.3 mL/min (by C-G formula based on SCr of 0.89 mg/dL). Liver Function Tests: Recent Labs  Lab 03/14/21 0652  AST 32  ALT 15  ALKPHOS 52  BILITOT 0.8  PROT 4.8*  ALBUMIN 2.7*   No results for input(s): LIPASE, AMYLASE in the last 168 hours. No results for input(s): AMMONIA in the last 168 hours. Coagulation Profile: No results for input(s): INR, PROTIME in the last 168 hours. Cardiac Enzymes: No results for input(s):  CKTOTAL, CKMB, CKMBINDEX, TROPONINI in the last 168 hours. BNP (last 3 results) No results for input(s): PROBNP in the last 8760 hours. HbA1C: No results for input(s): HGBA1C in the last 72 hours. CBG: Recent Labs  Lab 03/12/21 1154 03/12/21 1854 03/12/21 2110 03/13/21 0613  GLUCAP 99 104* 110* 118*   Lipid Profile: No results for input(s): CHOL, HDL, LDLCALC, TRIG, CHOLHDL, LDLDIRECT in the last 72 hours. Thyroid Function Tests: No results for input(s): TSH, T4TOTAL, FREET4, T3FREE, THYROIDAB in the last 72 hours. Anemia Panel: No results for input(s): VITAMINB12, FOLATE, FERRITIN, TIBC, IRON, RETICCTPCT in the last 72 hours. Urine analysis:    Component Value Date/Time   COLORURINE YELLOW 03/10/2021 [place] 03/10/2021 1132   LABSPEC 1.015 03/10/2021 1132   PHURINE 6.0 03/10/2021 1132   GLUCOSEU NEGATIVE 03/10/2021 1132   GLUCOSEU NEGATIVE 04/05/2019 1446   HGBUR NEGATIVE 03/10/2021 1132   BILIRUBINUR NEGATIVE 03/10/2021 1132   KETONESUR NEGATIVE 03/10/2021 1132   PROTEINUR NEGATIVE 03/10/2021 1132   UROBILINOGEN 0.2 04/05/2019 1446   NITRITE NEGATIVE 03/10/2021 1132   LEUKOCYTESUR NEGATIVE 03/10/2021 1132     Debralee Braaksma M.D. [facility] 03/14/2021, 1:39 PM  Available via Epic secure chat 7am-7pm After 7 pm, please refer to night coverage provider listed on amion.

## 2021-03-14 NOTE — Evaluation (Signed)
Physical Therapy Evaluation  Patient Details Name: Vernon Bishop MRN: [id number] DOB: 05-Jun-1940 Today's Date: 03/14/2021  History of Present Illness  Pt is an 80 y.o. M admitted on 12/15 due to L4-L5 PLIF. PMH significant of DDD, Arthralgia, HTN, PTSD, DM2.  Clinical Impression  Pt admitted with above diagnosis. At the time of PT eval, pt was able to demonstrate transfers and ambulation with up to min guard assist progressing to gross supervision by end of session. Pt was educated on precautions, brace application/wearing schedule, appropriate activity progression, and car transfer. Pt currently with functional limitations due to the deficits listed below (see PT Problem List). Pt will benefit from skilled PT to increase their independence and safety with mobility to allow discharge to the venue listed below.         Recommendations for follow up therapy are one component of a multi-disciplinary discharge planning process, led by the attending physician.  Recommendations may be updated based on patient status, additional functional criteria and insurance authorization.  Follow Up Recommendations No PT follow up    Assistance Recommended at Discharge PRN  Functional Status Assessment Patient has had a recent decline in their functional status and demonstrates the ability to make significant improvements in function in a reasonable and predictable amount of time.  Equipment Recommendations  None recommended by PT    Recommendations for Other Services       Precautions / Restrictions Precautions Precautions: Back;Fall Precaution Booklet Issued: Yes (comment) Precaution Comments: Reviewed precautions, and educated compensatory strategies for ADL's. Required Braces or Orthoses: Spinal Brace Spinal Brace: Lumbar corset Restrictions Weight Bearing Restrictions: No      Mobility  Bed Mobility Overal bed mobility: Modified Independent             General bed mobility comments: Pt  was received sitting up in the bathroom.    Transfers Overall transfer level: Needs assistance Equipment used: Rolling walker (2 wheels) Transfers: Sit to/from Stand Sit to Stand: Supervision           General transfer comment: VC's for hand placement on seated surface for safety. No assist required.    Ambulation/Gait Ambulation/Gait assistance: Supervision Gait Distance (Feet): 250 Feet Assistive device: Rolling walker (2 wheels) Gait Pattern/deviations: Step-through pattern;Decreased stride length;Trunk flexed Gait velocity: Decreased Gait velocity interpretation: <1.31 ft/sec, indicative of household ambulator   General Gait Details: VC's for improved posture, closer walker proximity, and forward gaze. No assist required however pt ambulating slow and guarded. Light supervision provided for safety.  Stairs Stairs: Yes Stairs assistance: Supervision Stair Management: One rail Right;Step to pattern;Forwards Number of Stairs: 5 General stair comments: VC's for sequencing and general safety.  Wheelchair Mobility    Modified Rankin (Stroke Patients Only)       Balance Overall balance assessment: Mild deficits observed, not formally tested                                           Pertinent Vitals/Pain Pain Assessment: Faces Faces Pain Scale: Hurts little more Pain Location: Surgical site Pain Descriptors / Indicators: Aching;Discomfort Pain Intervention(s): Limited activity within patient's tolerance;Monitored during session;Repositioned    Home Living Family/patient expects to be discharged to:: Private residence Living Arrangements: Spouse/significant other Available Help at Discharge: Family;Available 24 hours/day Type of Home: House Home Access: Stairs to enter   [employer] of Steps: 5 steps  Home Layout: One level Home Equipment: Conservation officer, nature (2 wheels);Cane - single point;Shower seat;Grab bars - toilet;BSC/3in1       Prior Function Prior Level of Function : Independent/Modified Independent;Driving             Mobility Comments: Indep, uses cane intermittently at baseline. ADLs Comments: Indep.     Hand Dominance   Dominant Hand: Right    Extremity/Trunk Assessment   Upper Extremity Assessment Upper Extremity Assessment: Defer to OT evaluation LUE Deficits / Details: Weak ~4/5 MMT overall, limited ROM in all planes. LUE Sensation: WNL LUE Coordination: decreased gross motor    Lower Extremity Assessment Lower Extremity Assessment: Generalized weakness;LLE deficits/detail LLE Deficits / Details: Baseline knee deficits and extreme varus. Unable to fully extend knee in standing. Pt reports he is in need of a knee replacement.    Cervical / Trunk Assessment Cervical / Trunk Assessment: Back Surgery  Communication   Communication: No difficulties  Cognition Arousal/Alertness: Awake/alert Behavior During Therapy: WFL for tasks assessed/performed Overall Cognitive Status: Within Functional Limits for tasks assessed                                          General Comments General comments (skin integrity, edema, etc.): Educated on compensatory strategies.    Exercises     Assessment/Plan    PT Assessment Patient needs continued PT services  PT Problem List Decreased strength;Decreased activity tolerance;Decreased balance;Decreased mobility;Decreased knowledge of use of DME;Decreased safety awareness;Decreased knowledge of precautions;Pain       PT Treatment Interventions DME instruction;Gait training;Stair training;Functional mobility training;Therapeutic activities;Therapeutic exercise;Neuromuscular re-education;Patient/family education    PT Goals (Current goals can be found in the Care Plan section)  Acute Rehab PT Goals Patient Stated Goal: Home today PT Goal Formulation: With patient Time For Goal Achievement: 03/21/21 Potential to Achieve Goals: Good     Frequency Min 5X/week   Barriers to discharge        Co-evaluation               AM-PAC PT "6 Clicks" Mobility  Outcome Measure Help needed turning from your back to your side while in a flat bed without using bedrails?: None Help needed moving from lying on your back to sitting on the side of a flat bed without using bedrails?: None Help needed moving to and from a bed to a chair (including a wheelchair)?: A Little Help needed standing up from a chair using your arms (e.g., wheelchair or bedside chair)?: A Little Help needed to walk in hospital room?: A Little Help needed climbing 3-5 steps with a railing? : A Little 6 Click Score: 20    End of Session Equipment Utilized During Treatment: Gait belt;Back brace Activity Tolerance: Patient tolerated treatment well Patient left: in chair;with call bell/phone within reach Nurse Communication: Mobility status PT Visit Diagnosis: Unsteadiness on feet (R26.81);Pain Pain - part of body:  (back)    Time: 7510-2585 PT Time Calculation (min) (ACUTE ONLY): 22 min   Charges:   PT Evaluation $PT Eval Low Complexity: 1 Low          Rolinda Roan, PT, DPT Acute Rehabilitation Services Pager: [phone] Office: [phone]   Thelma Comp 03/14/2021, 10:52 AM

## 2021-03-14 NOTE — Progress Notes (Signed)
[facility]  Subjective  -no complaints.  Abdomen soft.  Tolerating p.o.   Objective (!) 116/47 (!) 53 99.1 F (37.3 C) (Oral) 18 98%  Intake/Output Summary (Last 24 hours) at 03/14/2021 0913 Last data filed at 03/13/2021 1155 Gross per 24 hour  Intake 1600 ml  Output 250 ml  Net 1350 ml    Abdomen with appropriate postop incisional tenderness Abdominal incision over left oblique muscles clean dry and intact Left DP palpable  Laboratory Lab Results: No results for input(s): WBC, HGB, HCT, PLT in the last 72 hours. BMET Recent Labs    03/14/21 0652  NA 133*  K 3.8  CL 100  CO2 25  GLUCOSE 100*  BUN 13  CREATININE 0.89  CALCIUM 8.3*    COAG Lab Results  Component Value Date   INR 1.0 01/14/2021   INR 1.00 03/07/2013   No results found for: PTT  Assessment/Planning:  Postop day 2 status post oblique retroperitoneal spine exposure at L3-L4 and L4-L5 for OLIF.  Yesterday went back for a second stage with Dr. Rolena Infante.  Today he looks great and has a nice soft abdomen and a palpable pedal pulse in the left foot.  No concerns from my standpoint.  Marty Heck 03/14/2021 9:13 AM --

## 2021-03-14 NOTE — Progress Notes (Signed)
° ° °  Subjective: Procedure(s) (LRB): POSTERIOR SPINAL FUSION INSTRUMENTATION LUMBAR TWO THROUGH FIVE (N/A) 1 Day Post-Op  Patient reports pain as 2 on 0-10 scale.  Reports decreased leg pain reports incisional back pain   Positive void Negative bowel movement Negative flatus Negative chest pain or shortness of breath  Objective: Vital signs in last 24 hours: Temp:  [97.8 F (36.6 C)-99 F (37.2 C)] 98.6 F (37 C) (12/16 0358) Pulse Rate:  [54-64] 56 (12/16 0358) Resp:  [17-32] 18 (12/16 0358) BP: (124-160)/(47-98) 124/52 (12/16 0358) SpO2:  [94 %-100 %] 99 % (12/16 0358)  Intake/Output from previous day: 12/15 0701 - 12/16 0700 In: 1600 [I.V.:1600] Out: 650 [Urine:600; Blood:50]  Labs: No results for input(s): WBC, RBC, HCT, PLT in the last 72 hours. No results for input(s): NA, K, CL, CO2, BUN, CREATININE, GLUCOSE, CALCIUM in the last 72 hours. No results for input(s): LABPT, INR in the last 72 hours.  Physical Exam: Neurologically intact ABD soft Intact pulses distally Incision: dressing C/D/I and no drainage Compartment soft Body mass index is 28.48 kg/m.   Assessment/Plan: Patient stable  Continue mobilization with physical therapy Continue care  Advance diet Up with therapy Patient doing well s/p ant/post L2-5 fusion.   Ambulating with assistance, and pain well controlled.  Pre-op neuropathic leg pain improving. Plan on additional therapy today.  D/C in AM if cleared by PT and positive flatus/BM  Melina Schools, MD [facility] [phone]

## 2021-03-14 NOTE — Evaluation (Signed)
Occupational Therapy Evaluation Patient Details Name: Vernon Bishop MRN: [id number] DOB: 11-22-1940 Today's Date: 03/14/2021   History of Present Illness 80 y.o. M admitted on 12/15 due to L4-L5 OLIF. PMH significant of DDD, Arthralgia, HTN, PTSD, DM2.   Clinical Impression   Pt admitted for procedure listed above. PTA pt reported that he was independent with all ADL's and IADL's, including driving. AT this time, pt presents with increased weakness and mild balance deficits. Pt requiring supervision for transfers and increased time for functional mobility and safety. He is able to complete ADL's with compensatory strategies and light assist. Pt has no further OT needs and acute OT will sign off.       Recommendations for follow up therapy are one component of a multi-disciplinary discharge planning process, led by the attending physician.  Recommendations may be updated based on patient status, additional functional criteria and insurance authorization.   Follow Up Recommendations  No OT follow up    Assistance Recommended at Discharge Set up Supervision/Assistance  Functional Status Assessment  Patient has had a recent decline in their functional status and demonstrates the ability to make significant improvements in function in a reasonable and predictable amount of time.  Equipment Recommendations  None recommended by OT    Recommendations for Other Services       Precautions / Restrictions Precautions Precautions: Back Precaution Booklet Issued: Yes (comment) Precaution Comments: Reviewed precautions, and educated compensatory strategies for ADL's. Required Braces or Orthoses: Spinal Brace Spinal Brace: Lumbar corset Restrictions Weight Bearing Restrictions: No      Mobility Bed Mobility Overal bed mobility: Modified Independent             General bed mobility comments: Completed log roll with no cuing or assist.    Transfers Overall transfer level: Needs  assistance Equipment used: Rolling walker (2 wheels) Transfers: Sit to/from Stand Sit to Stand: Supervision           General transfer comment: Increased time to rise and steady, leaning heavily on RW initially.      Balance Overall balance assessment: Mild deficits observed, not formally tested                                         ADL either performed or assessed with clinical judgement   ADL Overall ADL's : Modified independent                                       General ADL Comments: Pt able to complete ADL's with compensatory strategies and minimal assistance from wife     Vision Baseline Vision/History: 1 Wears glasses Ability to See in Adequate Light: 0 Adequate Patient Visual Report: No change from baseline Vision Assessment?: No apparent visual deficits     Perception     Praxis      Pertinent Vitals/Pain Pain Assessment: Faces Faces Pain Scale: Hurts little more Pain Location: Surgical site Pain Descriptors / Indicators: Aching;Discomfort Pain Intervention(s): Monitored during session;Repositioned     Hand Dominance Right   Extremity/Trunk Assessment Upper Extremity Assessment Upper Extremity Assessment: LUE deficits/detail LUE Deficits / Details: Weak ~4/5 MMT overall, limited ROM in all planes. LUE Sensation: WNL LUE Coordination: decreased gross motor   Lower Extremity Assessment Lower Extremity Assessment: Defer to PT evaluation  Cervical / Trunk Assessment Cervical / Trunk Assessment: Back Surgery   Communication Communication Communication: No difficulties   Cognition Arousal/Alertness: Awake/alert Behavior During Therapy: WFL for tasks assessed/performed Overall Cognitive Status: Within Functional Limits for tasks assessed                                       General Comments  Educated on compensatory strategies.    Exercises     Shoulder Instructions      Home Living  Family/patient expects to be discharged to:: Private residence Living Arrangements: Spouse/significant other Available Help at Discharge: Family;Available 24 hours/day Type of Home: House Home Access: Stairs to enter [employer] of Steps: 5 steps   Home Layout: One level     Bathroom Shower/Tub: Occupational psychologist: Handicapped height Bathroom Accessibility: Yes How Accessible: Accessible via walker Home Equipment: [place] (2 wheels);Cane - single point;Shower seat;Grab bars - toilet;BSC/3in1          Prior Functioning/Environment Prior Level of Function : Independent/Modified Independent;Driving             Mobility Comments: Indep, uses cane intermittently at baseline. ADLs Comments: Indep.        OT Problem List: Decreased strength;Decreased range of motion;Decreased activity tolerance;Impaired balance (sitting and/or standing);Decreased knowledge of use of DME or AE;Impaired UE functional use;Pain      OT Treatment/Interventions:      OT Goals(Current goals can be found in the care plan section) Acute Rehab OT Goals Patient Stated Goal: To get stronger OT Goal Formulation: With patient Time For Goal Achievement: 03/14/21 Potential to Achieve Goals: Good  OT Frequency:     Barriers to D/C:            Co-evaluation              AM-PAC OT "6 Clicks" Daily Activity     Outcome Measure Help from another person eating meals?: None Help from another person taking care of personal grooming?: None Help from another person toileting, which includes using toliet, bedpan, or urinal?: None Help from another person bathing (including washing, rinsing, drying)?: A Little Help from another person to put on and taking off regular upper body clothing?: None Help from another person to put on and taking off regular lower body clothing?: A Little 6 Click Score: 22   End of Session Equipment Utilized During Treatment: Rolling walker (2  wheels);Back brace Nurse Communication: Mobility status  Activity Tolerance: Patient tolerated treatment well Patient left: Other (comment) (In bathroom, PT coming in)  OT Visit Diagnosis: Unsteadiness on feet (R26.81);Other abnormalities of gait and mobility (R26.89);Muscle weakness (generalized) (M62.81)                Time: 9702-6378 OT Time Calculation (min): 17 min Charges:  OT General Charges $OT Visit: 1 Visit OT Evaluation $OT Eval Low Complexity: [place]., OTR/L Acute Rehabilitation  Savyon Loken Elane Walterine Amodei 03/14/2021, 10:10 AM

## 2021-03-15 ENCOUNTER — Encounter: Payer: Self-pay | Admitting: Student

## 2021-03-15 NOTE — Progress Notes (Signed)
[facility] consult progress note                                                                              Patient Demographics  Vernon Bishop, is a 80 y.o. male, DOB - 06-04-1940, TJQ:[id number]  Admit date - 03/12/2021   Admitting Physician Melina Schools, MD  Outpatient Primary MD for the patient is [name], [name], MD  Outpatient specialists:   LOS - 3  days   Medical records reviewed and are as summarized below:    No chief complaint on file.       Brief summary   Patient is a 80 year old male with chronic back pain, HTN, A. fib, not on AC, bradycardia, OSA on CPAP, BPH admitted for lumbar fusion surgery. Patient was seen preoperatively by cardiology on 10/26 and was cleared for surgery.  [facility] hospitalist service was consulted for medical management of the comorbidities.   Assessment & Plan    Principal Problem: Chronic back pain S/P lumbar fusion - S/p OLIF L4-5, XLIF L2-4 on 03/12/21 -Underwent posterior spinal fusion with pedicle screw instrumentation L2-5 on 12/15 -Per primary service  Active Problems:  Atrial fibrillation ([place]) -In the remote past, not on anticoagulation.  Per outpatient [facility] cardiology, they would like to pursue 2-week cardiac telemetry monitor after spinal surgery to decide on anticoagulation -Outpatient follow-up with cardiology.    Bradycardia, chronic -Heart rate in 60s, asymptomatic, continue home dose of propranolol 80 mg 3 times daily   Essential hypertension -BP fairly stable.  No acute issues    Pulmonary hypertension, unspecified ([place]) -Last echo 10/22, moderate left atrial dilatation, G1 DD, EF 55 to 60%, moderate TR, estimated RVSP 47 mmHg -Outpatient follow-up with cardiology  Constipation Resolved, had a good BM today    OSA on CPAP -Continue CPAP  Code Status: Full code DVT Prophylaxis:  SCD's Start: 03/13/21 1306 SCD's Start: 03/12/21 2007   Level of Care: Level of care:  Med-Surg Family Communication: Discussed all imaging results, lab results, explained to the patient   Okay to DC from medical standpoint, I will sign off  Time Spent in minutes  43mins   Antimicrobials:   Anti-infectives (From admission, onward)    Start     Dose/Rate Route Frequency Ordered Stop   03/13/21 1930  ceFAZolin (ANCEF) IVPB 1 g/50 mL premix        1 g 100 mL/hr over 30 Minutes Intravenous Every 8 hours 03/13/21 1305 03/14/21 0407   03/13/21 0731  ceFAZolin (ANCEF) 2-4 GM/100ML-% IVPB       Note to Pharmacy: Granville Lewis, Lindsi R: cabinet override      03/13/21 0731 03/13/21 1944   03/13/21 0000  ceFAZolin (ANCEF) IVPB 1 g/50 mL premix        1 g 100 mL/hr over 30 Minutes Intravenous Every 8 hours 03/12/21 2006 03/13/21 1210   03/12/21 1153  ceFAZolin (ANCEF) IVPB 2g/100 mL premix        2 g 200 mL/hr over 30 Minutes Intravenous 30 min pre-op 03/12/21 1153 03/12/21 1729          Medications  Scheduled Meds:  amLODipine  5 mg Oral Daily   docusate sodium  100 mg Oral BID   FLUoxetine  40 mg Oral Daily   furosemide  20 mg Oral Daily   gabapentin  300 mg Oral TID   polyethylene glycol  17 g Oral Daily   propranolol  80 mg Oral TID   sodium chloride flush  3 mL Intravenous Q12H   Continuous Infusions:  sodium chloride     lactated ringers     methocarbamol (ROBAXIN) IV     PRN Meds:.acetaminophen **OR** acetaminophen, hydrALAZINE, menthol-cetylpyridinium **OR** phenol, methocarbamol **OR** methocarbamol (ROBAXIN) IV, ondansetron **OR** ondansetron (ZOFRAN) IV, oxyCODONE, oxyCODONE, sodium chloride flush, sodium phosphate      Subjective:   Vernon Bishop was seen and examined today.  Constipation resolved, had a good BM today, pain controlled.  States he is going home today.   Objective:   Vitals:   03/14/21 1943 03/14/21 2326 03/15/21 0436 03/15/21 0850  BP: (!) 116/43 135/61 (!) 143/58 (!) 135/51  Pulse: (!) 57 66 67 65  Resp: 20 20 20 18   Temp: 98.8  F (37.1 C) 99 F (37.2 C) 99.5 F (37.5 C) 99.1 F (37.3 C)  TempSrc: Oral Oral Oral Oral  SpO2: 96% 97% 97% 97%  Weight:      Height:       No intake or output data in the 24 hours ending 03/15/21 1102    Wt Readings from Last 3 Encounters:  03/12/21 95.3 kg  03/10/21 95.3 kg  03/03/21 94.6 kg   Physical Exam General: Alert and oriented x 3, NAD Cardiovascular: S1 S2 clear, RRR Respiratory: CTAB Gastrointestinal: Soft, nontender, nondistended, NBS Ext: no pedal edema bilaterally Psych: Normal affect and demeanor, alert and oriented x3   Data Reviewed:  I have personally reviewed following labs and imaging studies  Micro Results Recent Results (from the past 240 hour(s))  SARS CORONAVIRUS 2 (TAT 6-24 HRS) Nasopharyngeal Nasopharyngeal Swab     Status: None   Collection Time: 03/10/21 11:03 AM   Specimen: Nasopharyngeal Swab  Result Value Ref Range Status   SARS Coronavirus 2 NEGATIVE NEGATIVE Final    Comment: (NOTE) SARS-CoV-2 target nucleic acids are NOT DETECTED.  The SARS-CoV-2 RNA is generally detectable in upper and lower respiratory specimens during the acute phase of infection. Negative results do not preclude SARS-CoV-2 infection, do not rule out co-infections with other pathogens, and should not be used as the sole basis for treatment or other patient management decisions. Negative results must be combined with clinical observations, patient history, and epidemiological information. The expected result is Negative.  Fact Sheet for Patients: SugarRoll.be  Fact Sheet for Healthcare Providers: https://www.woods-mathews.com/  This test is not yet approved or cleared by the [place] FDA and  has been authorized for detection and/or diagnosis of SARS-CoV-2 by FDA under an Emergency Use Authorization (EUA). This EUA will remain  in effect (meaning this test can be used) for the duration of the COVID-19  declaration under Se ction 564(b)(1) of the Act, 21 U.S.C. section 360bbb-3(b)(1), unless the authorization is terminated or revoked sooner.  Performed at [facility], [place] [address] [place],  [address]   Surgical pcr screen     Status: Abnormal   Collection Time: 03/10/21 11:06 AM   Specimen: Nasal Mucosa; Nasal Swab  Result Value Ref Range Status   MRSA, PCR NEGATIVE NEGATIVE Final   Staphylococcus aureus POSITIVE (A) NEGATIVE Final    Comment: (NOTE) The Xpert SA Assay (FDA  approved for NASAL specimens in patients 59 years of age and older), is one component of a comprehensive surveillance program. It is not intended to diagnose infection nor to guide or monitor treatment. Performed at [facility], [place] [address], [place], [place] [address]     Radiology Reports DG Lumbar Spine 2-3 Views  Result Date: 03/13/2021 CLINICAL DATA:  Surgical posterior fusion extending from L2-L5. EXAM: LUMBAR SPINE - 2-3 VIEW; DG C-ARM 1-60 MIN-NO REPORT Radiation exposure index: 330.15 mGy. COMPARISON:  February 01, 2019. FINDINGS: Five intraoperative fluoroscopic images were obtained of the lumbar spine. These demonstrate the patient be status post surgical posterior fusion of L2-3, L3-4, and L4-5 with bilateral intrapedicular screw placement and interbody fusion. IMPRESSION: Fluoroscopic guidance provided during lower lumbar surgery. Electronically Signed   By: Marijo Conception M.D.   On: 03/13/2021 12:47   DG C-Arm 1-60 Min-No Report  Result Date: 03/13/2021 CLINICAL DATA:  Surgical posterior fusion extending from L2-L5. EXAM: LUMBAR SPINE - 2-3 VIEW; DG C-ARM 1-60 MIN-NO REPORT Radiation exposure index: 330.15 mGy. COMPARISON:  February 01, 2019. FINDINGS: Five intraoperative fluoroscopic images were obtained of the lumbar spine. These demonstrate the patient be status post surgical posterior fusion of L2-3, L3-4, and L4-5 with bilateral intrapedicular screw placement and  interbody fusion. IMPRESSION: Fluoroscopic guidance provided during lower lumbar surgery. Electronically Signed   By: Marijo Conception M.D.   On: 03/13/2021 12:47   DG C-Arm 1-60 Min-No Report  Result Date: 03/13/2021 CLINICAL DATA:  Surgical posterior fusion extending from L2-L5. EXAM: LUMBAR SPINE - 2-3 VIEW; DG C-ARM 1-60 MIN-NO REPORT Radiation exposure index: 330.15 mGy. COMPARISON:  February 01, 2019. FINDINGS: Five intraoperative fluoroscopic images were obtained of the lumbar spine. These demonstrate the patient be status post surgical posterior fusion of L2-3, L3-4, and L4-5 with bilateral intrapedicular screw placement and interbody fusion. IMPRESSION: Fluoroscopic guidance provided during lower lumbar surgery. Electronically Signed   By: Marijo Conception M.D.   On: 03/13/2021 12:47   DG C-Arm 1-60 Min-No Report  Result Date: 03/13/2021 CLINICAL DATA:  Surgical posterior fusion extending from L2-L5. EXAM: LUMBAR SPINE - 2-3 VIEW; DG C-ARM 1-60 MIN-NO REPORT Radiation exposure index: 330.15 mGy. COMPARISON:  February 01, 2019. FINDINGS: Five intraoperative fluoroscopic images were obtained of the lumbar spine. These demonstrate the patient be status post surgical posterior fusion of L2-3, L3-4, and L4-5 with bilateral intrapedicular screw placement and interbody fusion. IMPRESSION: Fluoroscopic guidance provided during lower lumbar surgery. Electronically Signed   By: Marijo Conception M.D.   On: 03/13/2021 12:47   DG C-Arm 1-60 Min-No Report  Result Date: 03/12/2021 Fluoroscopy was utilized by the requesting physician.  No radiographic interpretation.   DG C-Arm 1-60 Min-No Report  Result Date: 03/12/2021 Fluoroscopy was utilized by the requesting physician.  No radiographic interpretation.   DG C-Arm 1-60 Min-No Report  Result Date: 03/12/2021 Fluoroscopy was utilized by the requesting physician.  No radiographic interpretation.   DG C-Arm 1-60 Min-No Report  Result Date:  03/12/2021 Fluoroscopy was utilized by the requesting physician.  No radiographic interpretation.   DG C-Arm 1-60 Min-No Report  Result Date: 03/12/2021 Fluoroscopy was utilized by the requesting physician.  No radiographic interpretation.   DG OR LOCAL ABDOMEN  Result Date: 03/12/2021 CLINICAL DATA:  Status post surgical lumbar fusion. EXAM: OR LOCAL ABDOMEN COMPARISON:  None. FINDINGS: The patient appears to be status post surgical fusion of L2-3, L3-4 and L4-5. Two vascular clips are seen projected  over the sacrum. Status post bilateral total hip arthroplasty. No other radiopaque foreign body is noted. IMPRESSION: Status post surgical posterior fusion of multiple levels of the lower lumbar spine. 2 vascular clips are seen over sacrum. No other radiopaque foreign body is noted. These results were called by telephone at the time of interpretation on 03/12/2021 at 6:18 pm to provider [place] in OR 4, who verbally acknowledged these results. Electronically Signed   By: Marijo Conception M.D.   On: 03/12/2021 18:18    Lab Data:  CBC: Recent Labs  Lab 03/10/21 1132 03/14/21 0937  WBC 7.9 7.9  HGB 14.1 8.9*  HCT 41.5 26.4*  MCV 90.6 92.6  PLT 211 694*   Basic Metabolic Panel: Recent Labs  Lab 03/10/21 1132 03/14/21 0652  NA 137 133*  K 4.0 3.8  CL 104 100  CO2 26 25  GLUCOSE 93 100*  BUN 20 13  CREATININE 0.96 0.89  CALCIUM 9.1 8.3*   GFR: Estimated Creatinine Clearance: 79.3 mL/min (by C-G formula based on SCr of 0.89 mg/dL). Liver Function Tests: Recent Labs  Lab 03/14/21 0652  AST 32  ALT 15  ALKPHOS 52  BILITOT 0.8  PROT 4.8*  ALBUMIN 2.7*   No results for input(s): LIPASE, AMYLASE in the last 168 hours. No results for input(s): AMMONIA in the last 168 hours. Coagulation Profile: No results for input(s): INR, PROTIME in the last 168 hours. Cardiac Enzymes: No results for input(s): CKTOTAL, CKMB, CKMBINDEX, TROPONINI in the last 168 hours. BNP (last 3  results) No results for input(s): PROBNP in the last 8760 hours. HbA1C: No results for input(s): HGBA1C in the last 72 hours. CBG: Recent Labs  Lab 03/12/21 1154 03/12/21 1854 03/12/21 2110 03/13/21 0613  GLUCAP 99 104* 110* 118*   Lipid Profile: No results for input(s): CHOL, HDL, LDLCALC, TRIG, CHOLHDL, LDLDIRECT in the last 72 hours. Thyroid Function Tests: No results for input(s): TSH, T4TOTAL, FREET4, T3FREE, THYROIDAB in the last 72 hours. Anemia Panel: No results for input(s): VITAMINB12, FOLATE, FERRITIN, TIBC, IRON, RETICCTPCT in the last 72 hours. Urine analysis:    Component Value Date/Time   COLORURINE YELLOW 03/10/2021 [place] 03/10/2021 1132   LABSPEC 1.015 03/10/2021 1132   PHURINE 6.0 03/10/2021 1132   GLUCOSEU NEGATIVE 03/10/2021 1132   GLUCOSEU NEGATIVE 04/05/2019 1446   HGBUR NEGATIVE 03/10/2021 1132   BILIRUBINUR NEGATIVE 03/10/2021 1132   KETONESUR NEGATIVE 03/10/2021 1132   PROTEINUR NEGATIVE 03/10/2021 1132   UROBILINOGEN 0.2 04/05/2019 1446   NITRITE NEGATIVE 03/10/2021 1132   LEUKOCYTESUR NEGATIVE 03/10/2021 1132     Dalyn Kjos M.D. [facility] 03/15/2021, 11:02 AM  Available via Epic secure chat 7am-7pm After 7 pm, please refer to night coverage provider listed on amion.

## 2021-03-15 NOTE — Progress Notes (Signed)
Subjective: 2 Days Post-Op Procedure(s) (LRB): POSTERIOR SPINAL FUSION INSTRUMENTATION LUMBAR TWO THROUGH FIVE (N/A) Patient reports pain as 6 on 0-10 scale.   Patient seen in rounds for Dr. Rolena Infante. Patient was up walking the halls with PT when I arrived. He reports he has been doing well ambulating with therapy. He reports moderate pain, which is primarily incisional back pain. Reduced leg pain. He reports flatus and bowel movement this morning. He is ready to go home.    Objective: Vital signs in last 24 hours: Temp:  [98.8 F (37.1 C)-99.5 F (37.5 C)] 99.1 F (37.3 C) (12/17 0850) Pulse Rate:  [57-67] 65 (12/17 0850) Resp:  [18-20] 18 (12/17 0850) BP: (113-151)/(43-61) 135/51 (12/17 0850) SpO2:  [95 %-97 %] 97 % (12/17 0850)  Intake/Output from previous day: No intake or output data in the 24 hours ending 03/15/21 0909   Intake/Output this shift: No intake/output data recorded.  Labs: Recent Labs    03/14/21 0937  HGB 8.9*   Recent Labs    03/14/21 0937  WBC 7.9  RBC 2.85*  HCT 26.4*  PLT 140*   Recent Labs    03/14/21 0652  NA 133*  K 3.8  CL 100  CO2 25  BUN 13  CREATININE 0.89  GLUCOSE 100*  CALCIUM 8.3*   No results for input(s): LABPT, INR in the last 72 hours.  Exam: General - Patient is Alert and Oriented Extremity - Neurologically intact Sensation intact distally Intact pulses distally Dorsiflexion/Plantar flexion intact Dressing - dressing C/D/I   Past Medical History:  Diagnosis Date   Arthralgia    Atrial fibrillation ([place])    1992   Benign prostatic hyperplasia    Bradycardia    "my doctor says I have bradycardia, my heart rate is always in the 40s"   DDD (degenerative disc disease)    Depression    Diabetes mellitus    type II  - PT STATES NOT ON ANY DIABETIC MEDICINES   Dysrhythmia    aFIB   Dyssomnia    ED (erectile dysfunction)    Hyperlipidemia    Hypertension    Hypogonadism male    Joint pain    LBP (low back  pain)    Multiple actinic keratoses    Muscle weakness    Upper limb   MVP (mitral valve prolapse)    Neck pain    OSA on CPAP    cpap   Osteoarthritis of hip    Osteoarthritis, hand    SEVERE LOWER BACK PAIN, AND RESTLESS LEG SYNDROME   Otalgia of right ear    Pain in left knee    Pain in thoracic spine    Palpitations    PTSD (post-traumatic stress disorder)    Shoulder pain    Subjective visual disturbance    Swallowing problem    HX OF PILLS GETTING "STUCK" IN THROAT AT TIMES   Swelling    Type 2 diabetes mellitus ([place])    pt states he has never had it   Vitamin D deficiency     Assessment/Plan: 2 Days Post-Op Procedure(s) (LRB): POSTERIOR SPINAL FUSION INSTRUMENTATION LUMBAR TWO THROUGH FIVE (N/A) Principal Problem:   S/P lumbar fusion Active Problems:   OSA on CPAP   Essential hypertension   Atrial fibrillation ([facility])   Bradycardia   Pulmonary hypertension, unspecified ([facility])  Estimated body mass index is 28.48 kg/m as calculated from the following:   Height as of this encounter: 6' (1.829 m).  Weight as of this encounter: 95.3 kg. Advance diet D/C IV fluids  Patient doing well s/p ant/post L2-5 fusion.   Ambulating with assistance, and pain well controlled.  Pre-op neuropathic leg pain improving.  Patient passed PT today. He reports his pain is well controlled, and he has +BM. Plan for discharge home today. He will call with any concerns. Otherwise follow up with Dr. Rolena Infante.   Griffith Citron, PA-C Orthopedic Surgery [phone] 03/15/2021, 9:09 AM

## 2021-03-15 NOTE — Progress Notes (Signed)
Patient alert and oriented, mae's well, voiding adequate amount of urine, swallowing without difficulty, no c/o pain at time of discharge. Patient discharged home with family. Script and discharged instructions given to patient. Patient and family stated understanding of instructions given. Patient has an appointment with Dr. Brooks in 2 weeks 

## 2021-03-15 NOTE — Progress Notes (Signed)
Physical Therapy Treatment and Discharge Patient Details Name: Vernon Bishop MRN: [id number] DOB: 1940-11-16 Today's Date: 03/15/2021   History of Present Illness Pt is an 80 y.o. M admitted on 12/15 due to L4-L5 PLIF. PMH significant of DDD, Arthralgia, HTN, PTSD, DM2.    PT Comments    Pt made good progress towards his physical therapy goals during his inpatient stay. Reviewed supine TA contractions for core activation, spinal precautions and activity recommendations. Pt ambulating hallway distances with a walker at a supervision level. Able to complete ADL task at sink without physical assist. No further acute PT needs. Thank you for this consult.    Recommendations for follow up therapy are one component of a multi-disciplinary discharge planning process, led by the attending physician.  Recommendations may be updated based on patient status, additional functional criteria and insurance authorization.  Follow Up Recommendations  No PT follow up     Assistance Recommended at Discharge PRN  Equipment Recommendations  None recommended by PT    Recommendations for Other Services       Precautions / Restrictions Precautions Precautions: Back;Fall Precaution Booklet Issued: Yes (comment) Precaution Comments: Reviewed precautions, and educated compensatory strategies for ADL's. Required Braces or Orthoses: Spinal Brace Spinal Brace: Lumbar corset Restrictions Weight Bearing Restrictions: No     Mobility  Bed Mobility Overal bed mobility: Modified Independent             General bed mobility comments: HOB slightly elevated, good log roll technique, use of rail    Transfers Overall transfer level: Needs assistance Equipment used: Rolling walker (2 wheels) Transfers: Sit to/from Stand Sit to Stand: Supervision           General transfer comment: VC's for hand placement on seated surface for safety. No assist required.    Ambulation/Gait Ambulation/Gait  assistance: Supervision Gait Distance (Feet): 400 Feet Assistive device: Rolling walker (2 wheels) Gait Pattern/deviations: Step-through pattern;Decreased stride length;Trunk flexed Gait velocity: Decreased     General Gait Details: Verbal cues for glute activation, upright posture, upward gaze. Worsens with fatigue   Stairs             Wheelchair Mobility    Modified Rankin (Stroke Patients Only)       Balance Overall balance assessment: Mild deficits observed, not formally tested                                          Cognition Arousal/Alertness: Awake/alert Behavior During Therapy: WFL for tasks assessed/performed Overall Cognitive Status: Within Functional Limits for tasks assessed                                          Exercises Other Exercises Other Exercises: x5 TA contractions (3 s hold)    General Comments        Pertinent Vitals/Pain Pain Assessment: Faces Faces Pain Scale: Hurts little more Pain Location: Surgical site Pain Descriptors / Indicators: Aching;Discomfort Pain Intervention(s): Monitored during session    Home Living                          Prior Function            PT Goals (current goals can now be found in the care  plan section) Acute Rehab PT Goals Patient Stated Goal: Home today PT Goal Formulation: With patient Time For Goal Achievement: 03/21/21 Potential to Achieve Goals: Good Progress towards PT goals: Goals met/education completed, patient discharged from PT    Frequency    Min 5X/week      PT Plan Current plan remains appropriate    Co-evaluation              AM-PAC PT "6 Clicks" Mobility   Outcome Measure  Help needed turning from your back to your side while in a flat bed without using bedrails?: None Help needed moving from lying on your back to sitting on the side of a flat bed without using bedrails?: None Help needed moving to and from a bed  to a chair (including a wheelchair)?: A Little Help needed standing up from a chair using your arms (e.g., wheelchair or bedside chair)?: A Little Help needed to walk in hospital room?: A Little Help needed climbing 3-5 steps with a railing? : A Little 6 Click Score: 20    End of Session Equipment Utilized During Treatment: Gait belt;Back brace Activity Tolerance: Patient tolerated treatment well Patient left: with call bell/phone within reach;in bed Nurse Communication: Mobility status PT Visit Diagnosis: Unsteadiness on feet (R26.81);Pain Pain - part of body:  (back)     Time: 6190-1222 PT Time Calculation (min) (ACUTE ONLY): 18 min  Charges:  $Gait Training: 8-22 mins                     Wyona Almas, PT, DPT Acute Rehabilitation Services Pager [phone] Office [phone]    Deno Etienne 03/15/2021, 8:47 AM

## 2021-03-21 NOTE — Discharge Summary (Signed)
Patient ID: NATE COMMON MRN: [id number] DOB/AGE: 1940-12-18 80 y.o.  Admit date: 03/12/2021 Discharge date: 03/15/2021  Admission Diagnoses:  Principal Problem:   S/P lumbar fusion Active Problems:   OSA on CPAP   Essential hypertension   Atrial fibrillation (HCC)   Bradycardia   Pulmonary hypertension, unspecified ([place])   Discharge Diagnoses:  Principal Problem:   S/P lumbar fusion Active Problems:   OSA on CPAP   Essential hypertension   Atrial fibrillation (HCC)   Bradycardia   Pulmonary hypertension, unspecified ([place])  status post Procedure(s): POSTERIOR SPINAL FUSION INSTRUMENTATION LUMBAR TWO THROUGH FIVE  Past Medical History:  Diagnosis Date   Arthralgia    Atrial fibrillation ([place])    1992   Benign prostatic hyperplasia    Bradycardia    "my doctor says I have bradycardia, my heart rate is always in the 40s"   DDD (degenerative disc disease)    Depression    Diabetes mellitus    type II  - PT STATES NOT ON ANY DIABETIC MEDICINES   Dysrhythmia    aFIB   Dyssomnia    ED (erectile dysfunction)    Hyperlipidemia    Hypertension    Hypogonadism male    Joint pain    LBP (low back pain)    Multiple actinic keratoses    Muscle weakness    Upper limb   MVP (mitral valve prolapse)    Neck pain    OSA on CPAP    cpap   Osteoarthritis of hip    Osteoarthritis, hand    SEVERE LOWER BACK PAIN, AND RESTLESS LEG SYNDROME   Otalgia of right ear    Pain in left knee    Pain in thoracic spine    Palpitations    PTSD (post-traumatic stress disorder)    Shoulder pain    Subjective visual disturbance    Swallowing problem    HX OF PILLS GETTING "STUCK" IN THROAT AT TIMES   Swelling    Type 2 diabetes mellitus ([place])    pt states he has never had it   Vitamin D deficiency     Surgeries: Procedure(s): POSTERIOR SPINAL FUSION INSTRUMENTATION LUMBAR TWO THROUGH FIVE on 03/13/2021   Consultants: Treatment Team:  Marty Heck,  MD  Discharged Condition: Improved  Hospital Course: RASHEE MARSCHALL is an 80 y.o. male who was admitted 03/12/2021 for operative treatment of S/P lumbar fusion. Patient failed conservative treatments (please see the history and physical for the specifics) and had severe unremitting pain that affects sleep, daily activities and work/hobbies. After pre-op clearance, the patient was taken to the operating room on 03/13/2021 and underwent  Procedure(s): [place].    Patient was given perioperative antibiotics:  Anti-infectives (From admission, onward)    Start     Dose/Rate Route Frequency Ordered Stop   03/13/21 1930  ceFAZolin (ANCEF) IVPB 1 g/50 mL premix        1 g 100 mL/hr over 30 Minutes Intravenous Every 8 hours 03/13/21 1305 03/14/21 0407   03/13/21 0731  ceFAZolin (ANCEF) 2-4 GM/100ML-% IVPB       Note to Pharmacy: Granville Lewis, Lindsi R: cabinet override      03/13/21 0731 03/13/21 1944   03/13/21 0000  ceFAZolin (ANCEF) IVPB 1 g/50 mL premix        1 g 100 mL/hr over 30 Minutes Intravenous Every 8 hours 03/12/21 2006 03/13/21 1210   03/12/21 1153  ceFAZolin (ANCEF)  IVPB 2g/100 mL premix        2 g 200 mL/hr over 30 Minutes Intravenous 30 min pre-op 03/12/21 1153 03/12/21 1729        Patient was given sequential compression devices and early ambulation to prevent DVT.   Patient benefited maximally from hospital stay and there were no complications. At the time of discharge, the patient was urinating/moving their bowels without difficulty, tolerating a regular diet, pain is controlled with oral pain medications and they have been cleared by PT/OT.   Recent vital signs: No data found.   Recent laboratory studies: No results for input(s): WBC, HGB, HCT, PLT, NA, K, CL, CO2, BUN, CREATININE, GLUCOSE, INR, CALCIUM in the last 72 hours.  Invalid input(s): PT, 2   Discharge Medications:   Allergies as of 03/15/2021        Reactions   Abilify [aripiprazole] Shortness Of Breath   SOB feeling   Cymbalta [duloxetine Hcl]    constipation   Digoxin And Related    HR 41   Prednisolone Swelling   Edema, wt gain   Temazepam    Pt unsure of this         Medication List     STOP taking these medications    cholecalciferol 25 MCG (1000 UNIT) tablet Commonly known as: VITAMIN D   ciclopirox 8 % solution Commonly known as: PENLAC   clotrimazole-betamethasone cream Commonly known as: LOTRISONE   morphine 30 MG 12 hr tablet Commonly known as: MS CONTIN   tiZANidine 4 MG tablet Commonly known as: ZANAFLEX   Zinc 30 MG Tabs       TAKE these medications    acetaminophen 500 MG tablet Commonly known as: TYLENOL Take 1,000 mg by mouth every 8 (eight) hours as needed for moderate pain.   amLODipine 5 MG tablet Commonly known as: NORVASC Take 1 tablet (5 mg total) by mouth daily.   FLUoxetine 40 MG capsule Commonly known as: PROZAC TAKE 1 CAPSULE BY MOUTH DAILY.   furosemide 20 MG tablet Commonly known as: LASIX Take 1 tablet (20 mg total) by mouth daily.   gabapentin 300 MG capsule Commonly known as: NEURONTIN TAKE 1 CAPSULE BY MOUTH THREE TIMES A DAY   Lubricant Eye Drops 0.4-0.3 % Soln Generic drug: Polyethyl Glycol-Propyl Glycol Place 1 drop into both eyes 3 (three) times daily as needed (dry/irritated eyes.).   ondansetron 4 MG tablet Commonly known as: Zofran Take 1 tablet (4 mg total) by mouth every 8 (eight) hours as needed for nausea or vomiting.   polyethylene glycol 17 g packet Commonly known as: MIRALAX / GLYCOLAX Take 17 g by mouth 2 (two) times daily. What changed: when to take this   propranolol 80 MG tablet Commonly known as: INDERAL TAKE 1 TABLET BY MOUTH 3 TIMES DAILY.   sodium chloride 0.65 % Soln nasal spray Commonly known as: OCEAN Place 1 spray into both nostrils as needed for congestion.   testosterone cypionate 200 MG/ML injection Commonly known as:  DEPOTESTOSTERONE CYPIONATE INJECT 3 MILLILITERS INTO THE MUSCLE EVERY 14 DAYS   traZODone 50 MG tablet Commonly known as: DESYREL TAKE 1 TO 2 TABLETS BY MOUTH AT BEDTIME.   Vitamin B-12 CR 1000 MCG Tbcr Take 1,000 mcg by mouth daily.       ASK your doctor about these medications    methocarbamol 500 MG tablet Commonly known as: Robaxin Take 1 tablet (500 mg total) by mouth every 8 (eight) hours as needed for up  to 5 days for muscle spasms. Ask about: Should I take this medication?   oxyCODONE-acetaminophen 10-325 MG tablet Commonly known as: Percocet Take 1 tablet by mouth every 6 (six) hours as needed for up to 5 days for pain. Ask about: Should I take this medication?        Diagnostic Studies: DG Lumbar Spine 2-3 Views  Result Date: 03/13/2021 CLINICAL DATA:  Surgical posterior fusion extending from L2-L5. EXAM: LUMBAR SPINE - 2-3 VIEW; DG C-ARM 1-60 MIN-NO REPORT Radiation exposure index: 330.15 mGy. COMPARISON:  February 01, 2019. FINDINGS: Five intraoperative fluoroscopic images were obtained of the lumbar spine. These demonstrate the patient be status post surgical posterior fusion of L2-3, L3-4, and L4-5 with bilateral intrapedicular screw placement and interbody fusion. IMPRESSION: Fluoroscopic guidance provided during lower lumbar surgery. Electronically Signed   By: Marijo Conception M.D.   On: 03/13/2021 12:47   DG C-Arm 1-60 Min-No Report  Result Date: 03/13/2021 CLINICAL DATA:  Surgical posterior fusion extending from L2-L5. EXAM: LUMBAR SPINE - 2-3 VIEW; DG C-ARM 1-60 MIN-NO REPORT Radiation exposure index: 330.15 mGy. COMPARISON:  February 01, 2019. FINDINGS: Five intraoperative fluoroscopic images were obtained of the lumbar spine. These demonstrate the patient be status post surgical posterior fusion of L2-3, L3-4, and L4-5 with bilateral intrapedicular screw placement and interbody fusion. IMPRESSION: Fluoroscopic guidance provided during lower lumbar surgery.  Electronically Signed   By: Marijo Conception M.D.   On: 03/13/2021 12:47   DG C-Arm 1-60 Min-No Report  Result Date: 03/13/2021 CLINICAL DATA:  Surgical posterior fusion extending from L2-L5. EXAM: LUMBAR SPINE - 2-3 VIEW; DG C-ARM 1-60 MIN-NO REPORT Radiation exposure index: 330.15 mGy. COMPARISON:  February 01, 2019. FINDINGS: Five intraoperative fluoroscopic images were obtained of the lumbar spine. These demonstrate the patient be status post surgical posterior fusion of L2-3, L3-4, and L4-5 with bilateral intrapedicular screw placement and interbody fusion. IMPRESSION: Fluoroscopic guidance provided during lower lumbar surgery. Electronically Signed   By: Marijo Conception M.D.   On: 03/13/2021 12:47   DG C-Arm 1-60 Min-No Report  Result Date: 03/13/2021 CLINICAL DATA:  Surgical posterior fusion extending from L2-L5. EXAM: LUMBAR SPINE - 2-3 VIEW; DG C-ARM 1-60 MIN-NO REPORT Radiation exposure index: 330.15 mGy. COMPARISON:  February 01, 2019. FINDINGS: Five intraoperative fluoroscopic images were obtained of the lumbar spine. These demonstrate the patient be status post surgical posterior fusion of L2-3, L3-4, and L4-5 with bilateral intrapedicular screw placement and interbody fusion. IMPRESSION: Fluoroscopic guidance provided during lower lumbar surgery. Electronically Signed   By: Marijo Conception M.D.   On: 03/13/2021 12:47   DG C-Arm 1-60 Min-No Report  Result Date: 03/12/2021 Fluoroscopy was utilized by the requesting physician.  No radiographic interpretation.   DG C-Arm 1-60 Min-No Report  Result Date: 03/12/2021 Fluoroscopy was utilized by the requesting physician.  No radiographic interpretation.   DG C-Arm 1-60 Min-No Report  Result Date: 03/12/2021 Fluoroscopy was utilized by the requesting physician.  No radiographic interpretation.   DG C-Arm 1-60 Min-No Report  Result Date: 03/12/2021 Fluoroscopy was utilized by the requesting physician.  No radiographic interpretation.    DG C-Arm 1-60 Min-No Report  Result Date: 03/12/2021 Fluoroscopy was utilized by the requesting physician.  No radiographic interpretation.   DG OR LOCAL ABDOMEN  Result Date: 03/12/2021 CLINICAL DATA:  Status post surgical lumbar fusion. EXAM: OR LOCAL ABDOMEN COMPARISON:  None. FINDINGS: The patient appears to be status post surgical fusion of L2-3, L3-4 and L4-5. Two  vascular clips are seen projected over the sacrum. Status post bilateral total hip arthroplasty. No other radiopaque foreign body is noted. IMPRESSION: Status post surgical posterior fusion of multiple levels of the lower lumbar spine. 2 vascular clips are seen over sacrum. No other radiopaque foreign body is noted. These results were called by telephone at the time of interpretation on 03/12/2021 at 6:18 pm to provider [place] in OR 4, who verbally acknowledged these results. Electronically Signed   By: Marijo Conception M.D.   On: 03/12/2021 18:18    Discharge Instructions     Incentive spirometry RT   Complete by: As directed         Follow-up Information     Melina Schools, MD. Schedule an appointment as soon as possible for a visit in 2 week(s).   Specialty: Orthopedic Surgery Why: As needed, If symptoms worsen, For suture removal, For wound re-check Contact information: [address] [place] [address] [phone]                 Discharge Plan:  discharge to home  Disposition: stable    Signed: Charlyne Petrin for [facility] PA-C [facility] [phone] 03/21/2021, 8:22 AM

## 2021-03-30 DIAGNOSIS — G4733 Obstructive sleep apnea (adult) (pediatric): Secondary | ICD-10-CM | POA: Diagnosis not present

## 2021-04-07 ENCOUNTER — Ambulatory Visit (INDEPENDENT_AMBULATORY_CARE_PROVIDER_SITE_OTHER): Payer: PPO

## 2021-04-07 DIAGNOSIS — I272 Pulmonary hypertension, unspecified: Secondary | ICD-10-CM

## 2021-04-07 DIAGNOSIS — I10 Essential (primary) hypertension: Secondary | ICD-10-CM

## 2021-04-07 DIAGNOSIS — M5440 Lumbago with sciatica, unspecified side: Secondary | ICD-10-CM

## 2021-04-07 NOTE — Progress Notes (Addendum)
Chronic Care Management Pharmacy Note  04/07/2021 Name:  Vernon Bishop MRN:  [id number] DOB:  07/20/1940  Summary: - Patient recently had lumbar fusion, released from hospital 03/15/2021 - feels that he has been improving since procedure - notes that pain in right leg has resolved - no longer using morphine, has been using oxycodone/APAP no more than once daily for pain control -BP at home has been controlled, averaging 130/75 -Confirms compliance with current medications, denies any issues or concerns with current medications   Recommendations/Changes made from today's visit: -Patient to continue monitoring blood pressure at least 1-2 times weekly, patient to reach out should BP average >140/90 -Follow up with cardiology regarding cardiac telemetry study planned now that procedure has been completed - Recommending for patient to discuss starting rosuvastatin 78m daily due hyperlipidemia/ CV risk reduction with next PCP visit   Subjective: Vernon BANNISTERis an 81y.o. year old male who is a primary patient of Vernon Bishop.  The CCM team was consulted for assistance with disease management and care coordination needs.    Engaged with patient by telephone for follow up visit in response to provider referral for pharmacy case management and/or care coordination services.   Consent to Services:  The patient was given the following information about Chronic Care Management services today, agreed to services, and gave verbal consent: 1. CCM service includes personalized support from designated clinical staff supervised by the primary care provider, including individualized plan of care and coordination with other care providers 2. 24/7 contact phone numbers for assistance for urgent and routine care needs. 3. Service will only be billed when office clinical staff spend 20 minutes or more in a month to coordinate care. 4. Only one practitioner may furnish and bill the service in a  calendar month. 5.The patient may stop CCM services at any time (effective at the end of the month) by phone call to the office staff. 6. The patient will be responsible for cost sharing (co-pay) of up to 20% of the service fee (after annual deductible is met). Patient agreed to services and consent obtained.  Patient Care Team: Vernon Bishop as PCP - General (Internal Medicine) OParalee Cancel Bishop as Consulting Physician (Orthopedic Surgery) PNorma Fredrickson Bishop as Consulting Physician (Psychiatry) BMelina Schools Bishop as Consulting Physician (Orthopedic Surgery) Charlestine Bishop, DDarnelle Maffucci [facility] ([facility]armacist[facility], PMonticelloas Consulting Physician (Ophthalmology) PNigel Mormon Bishop as Consulting Physician (Cardiology)  Recent office visits:  03/03/2021 - Dr. PAlain Marion - pre-procedure clearance - clear for surgery  01/06/2021 - Dr. PAlain Marion- preop clearance    Recent consult visits:  01/22/2021 - Dr. PVirgina Jock- Cardiology  - plans for a 2 week cardiac telemetry after upcoming surgery - recommended pharmacological nuclear stress - unless any high risk findings identified - able to proceed with spine surgery  01/07/2021 - Dr. CCarlis Abbott- Vascular Surgery - discussion of planned L4-L5 fusion [facility]  03/12/2021 - Lumbar Fusion procedure -  Objective:  Lab Results  Component Value Date   CREATININE 0.89 03/14/2021   BUN 13 03/14/2021   GFR 64.44 08/30/2020   GFRNONAA >60 03/14/2021   GFRAA 101 12/20/2019   NA 133 (L) 03/14/2021   K 3.8 03/14/2021   CALCIUM 8.3 (L) 03/14/2021   CO2 25 03/14/2021   GLUCOSE 100 (H) 03/14/2021    Lab Results  Component Value Date/Time   HGBA1C 4.9  01/14/2021 03:00 PM   HGBA1C 4.7 12/20/2019 03:16 PM   GFR 64.44 08/30/2020 03:11 PM   GFR 108.97 04/05/2019 02:46 PM   MICROALBUR 3.9 12/20/2019 03:16 PM    Last diabetic Eye exam:  No results found for: HMDIABEYEEXA  Last diabetic  Foot exam:  No results found for: HMDIABFOOTEX   Lab Results  Component Value Date   CHOL 178 12/20/2019   HDL 37 (L) 12/20/2019   LDLCALC 120 (H) 12/20/2019   TRIG 99 12/20/2019   CHOLHDL 4.8 12/20/2019    Hepatic Function Latest Ref Rng & Units 03/14/2021 08/30/2020 12/20/2019  Total Protein 6.5 - 8.1 g/dL 4.8(L) 7.3 7.1  Albumin 3.5 - 5.0 g/dL 2.7(L) 4.4 -  AST 15 - 41 U/L 32 27 17  ALT 0 - 44 U/L _0 Alk Phosphatase 38 - 126 U/L 52 79 -  Total Bilirubin 0.3 - 1.2 mg/dL 0.8 0.9 0.7  Bilirubin, Direct 0.0 - 0.3 mg/dL - - -    Lab Results  Component Value Date/Time   TSH 3.59 08/30/2020 03:11 PM   TSH 2.34 12/20/2019 03:16 PM    CBC Latest Ref Rng & Units 03/14/2021 03/10/2021 01/14/2021  WBC 4.0 - 10.5 K/uL 7.9 7.9 5.8  Hemoglobin 13.0 - 17.0 g/dL 8.9(L) 14.1 15.4  Hematocrit 39.0 - 52.0 % 26.4(L) 41.5 46.9  Platelets 150 - 400 K/uL 140(L) 211 174    Lab Results  Component Value Date/Time   VD25OH 35.78 04/05/2019 02:46 PM   VD25OH 21 (L) 11/01/2008 09:33 PM    Clinical ASCVD: No  The ASCVD Risk score (Arnett DK, et al., 2019) failed to calculate for the following reasons:   The 2019 ASCVD risk score is only valid for ages 84 to 29    Depression screen PHQ 2/9 12/09/2020 11/14/2020 09/20/2018  Decreased Interest 0 0 1  Down, Depressed, Hopeless 0 0 1  PHQ - 2 Score 0 0 2  Altered sleeping - - 2  Tired, decreased energy - - 1  Change in appetite - - 2  Feeling bad or failure about yourself  - - 0  Trouble concentrating - - 0  Moving slowly or fidgety/restless - - 0  Suicidal thoughts - - 0  PHQ-9 Score - - 7  Difficult doing work/chores - - Not difficult at all  Some recent data might be hidden    Social History   Tobacco Use  Smoking Status Former   Packs/day: 0.50   Years: 12.00   Pack years: 6.00   Types: Cigarettes   Quit date: 03/31/1971   Years since quitting: 50.0  Smokeless Tobacco Never  Tobacco Comments   Started at age 51   BP  Readings from Last 3 Encounters:  03/15/21 (!) 135/51  03/10/21 (!) 153/64  03/03/21 (!) 154/60   Pulse Readings from Last 3 Encounters:  03/15/21 65  03/10/21 (!) 58  03/03/21 (!) 47   Wt Readings from Last 3 Encounters:  03/12/21 210 lb (95.3 kg)  03/10/21 210 lb (95.3 kg)  03/03/21 208 lb 9.6 oz (94.6 kg)   BMI Readings from Last 3 Encounters:  03/12/21 28.48 kg/m  03/10/21 28.48 kg/m  03/03/21 28.29 kg/m    Assessment/Interventions: Review of patient past medical history, allergies, medications, health status, including review of consultants reports, laboratory and other test data, was performed as part of comprehensive evaluation and provision of chronic care management services.   SDOH:  (Social Determinants of Health) assessments and interventions  performed: Yes  SDOH Screenings   Alcohol Screen: Low Risk    Last Alcohol Screening Score (AUDIT): 0  Depression (PHQ2-9): Low Risk    PHQ-2 Score: 0  Financial Resource Strain: Low Risk    Difficulty of Paying Living Expenses: Not hard at all  Food Insecurity: No Food Insecurity   Worried About Charity fundraiser in the Last Year: Never true   Ran Out of Food in the Last Year: Never true  Housing: Low Risk    Last Housing Risk Score: 0  Physical Activity: Inactive   Days of Exercise per Week: 0 days   Minutes of Exercise per Session: 0 min  Social Connections: Engineer, building services of Communication with Friends and Family: More than three times a week   Frequency of Social Gatherings with Friends and Family: More than three times a week   Attends Religious Services: 1 to 4 times per year   Active Member of [employer] or Organizations: Yes   Attends Archivist Meetings: 1 to 4 times per year   Marital Status: Married  Stress: No Stress Concern Present   Feeling of Stress : Not at all  Tobacco Use: Medium Risk   Smoking Tobacco Use: Former   Smokeless Tobacco Use: Never   Passive Exposure: Not  on Pensions consultant Needs: No Transportation Needs   Lack of Transportation (Medical): No   Lack of Transportation (Non-Medical): No    CCM Care Plan  Allergies  Allergen Reactions   Abilify [Aripiprazole] Shortness Of Breath    SOB feeling   Cymbalta [Duloxetine Hcl]     constipation   Digoxin And Related     HR 41   Prednisolone Swelling    Edema, wt gain   Temazepam     Pt unsure of this     Medications Reviewed Today     Reviewed by Tomasa Blase, [facility] (Pharmacist) on 04/07/21 at [place] List Status: <None>   Medication Order Taking? Sig Documenting Provider Last Dose Status Informant  acetaminophen (TYLENOL) 500 MG tablet [id number] Yes Take 1,000 mg by mouth every 8 (eight) hours as needed for moderate pain. Provider, Historical, Bishop Taking Active Self  amLODipine (NORVASC) 5 MG tablet [id number] Yes Take 1 tablet (5 mg total) by mouth daily. [name], [name], Bishop Taking Active Self  Cholecalciferol (VITAMIN D3 PO) [id number] Yes Take 1 tablet by mouth daily. Provider, Historical, Bishop Taking Active   Cyanocobalamin (VITAMIN B-12 CR) 1000 MCG TBCR [id number] Yes Take 1,000 mcg by mouth daily.   Provider, Historical, Bishop Taking Active Self           Med Note Marta Lamas Dec 09, 2020  2:53 PM)    FLUoxetine (PROZAC) 40 MG capsule [id number] Yes TAKE 1 CAPSULE BY MOUTH DAILY. [name], [name], Bishop Taking Active Self  furosemide (LASIX) 20 MG tablet [id number] Yes Take 1 tablet (20 mg total) by mouth daily. [name], [name], Bishop Taking Active Self  gabapentin (NEURONTIN) 300 MG capsule [id number] Yes TAKE 1 CAPSULE BY MOUTH THREE TIMES A DAY [name], [name], Bishop Taking Active Self  ondansetron (ZOFRAN) 4 MG tablet [id number] Yes Take 1 tablet (4 mg total) by mouth every 8 (eight) hours as needed for nausea or vomiting. [employer], Bishop Taking Active   oxyCODONE-acetaminophen (PERCOCET) 10-325 MG tablet [id number] Yes Take 1 tablet by mouth daily as needed  for pain. Provider, Historical, Bishop Taking Active  Polyethyl Glycol-Propyl Glycol (LUBRICANT EYE DROPS) 0.4-0.3 % SOLN [id number] Yes Place 1 drop into both eyes 3 (three) times daily as needed (dry/irritated eyes.). Provider, Historical, Bishop Taking Active Self  polyethylene glycol (MIRALAX / GLYCOLAX) 17 g packet [id number] Yes Take 17 g by mouth 2 (two) times daily.  Patient taking differently: Take 17 g by mouth every other day.   Danae Orleans, PA-C Taking Active Self  propranolol (INDERAL) 80 MG tablet [id number] Yes TAKE 1 TABLET BY MOUTH 3 TIMES DAILY. [name], [name], Bishop Taking Active Self  sodium chloride (OCEAN) 0.65 % SOLN nasal spray [id number] Yes Place 1 spray into both nostrils as needed for congestion. Provider, Historical, Bishop Taking Active Self  testosterone cypionate (DEPOTESTOSTERONE CYPIONATE) 200 MG/ML injection [id number] Yes INJECT 3 MILLILITERS INTO THE MUSCLE EVERY 14 DAYS [name], [name], Bishop Taking Active Self  traZODone (DESYREL) 50 MG tablet [id number] Yes TAKE 1 TO 2 TABLETS BY MOUTH AT BEDTIME. [name], [name], Bishop Taking Active Self  zinc gluconate 50 MG tablet [id number] Yes Take 50 mg by mouth daily. Provider, Historical, Bishop Taking Active             Patient Active Problem List   Diagnosis Date Noted   S/P lumbar fusion 03/12/2021   History of atrial fibrillation 01/22/2021   Pulmonary hypertension, unspecified ([place]) 01/20/2021   Chronic back pain 01/07/2021   S/P hip replacement, left 05/23/2019   Ear pain, right 04/05/2019   Leg weakness, bilateral 02/01/2019   Arm weakness 04/07/2018   BPH (benign prostatic hyperplasia) 04/23/2017   Hip pain, chronic, left 05/06/2016   Delusional thoughts ([place]) 05/06/2016   CTS (carpal tunnel syndrome) 07/29/2015   Ulnar neuropathy 07/29/2015   Generalized anxiety disorder 07/29/2015   Pre-op evaluation 01/28/2015   Insomnia 04/13/2014   Left knee pain 10/10/2013   Delusional disorder ([place]) 10/10/2013    Overweight (BMI 25.0-29.9) 03/15/2013   S/P total hip arthroplasty 03/14/2013   Hip osteoarthritis 03/06/2013   Bradycardia 10/09/2012   Sinusitis, acute 06/29/2012   Actinic keratoses 05/24/2012   Hyponatremia 10/23/2011   Edema 10/23/2011   Shoulder pain, acute 07/13/2011   Arthralgia 06/08/2011   Cervical pain 01/28/2011   Neoplasm of uncertain behavior of skin 06/16/2010   BRADYCARDIA 03/17/2010   SINUSITIS, ACUTE 12/11/2009   PAIN IN THORACIC SPINE 04/26/2009   TOBACCO USE, QUIT 02/19/2009   OSA on CPAP 11/15/2008   Hypogonadism male 05/02/2008   Vitamin B12 deficiency 08/26/2007   VISUAL CHANGES 08/25/2007   OTHER CONVULSIONS 08/25/2007   SLEEP DEPRIVATION 08/25/2007   Palpitations 08/25/2007   HIP PAIN 04/29/2007   Osteoarthritis 02/15/2007   Dyslipidemia 02/14/2007   Depression 02/14/2007   Atrial fibrillation ([place]) 02/14/2007   Seborrheic dermatitis 02/14/2007   Diabetes mellitus type 2, diet-controlled ([place]) 01/11/2007   Essential hypertension 01/11/2007   LOW BACK PAIN 01/11/2007    Immunization History  Administered Date(s) Administered   Fluad Quad(high Dose 65+) 11/22/2018, 12/20/2019   Influenza Split 01/28/2011, 12/18/2011   Influenza Whole 02/14/2007, 12/11/2009   Influenza, High Dose Seasonal PF 02/03/2016, 12/30/2016, 01/04/2018, 12/09/2020   Influenza,inj,Quad PF,6+ Mos 01/03/2013, 01/10/2014, 12/05/2014   PFIZER(Purple Top)SARS-COV-2 Vaccination 04/19/2019, 05/10/2019, 12/24/2019, 07/05/2020   PNEUMOCOCCAL CONJUGATE-20 08/30/2020   Pfizer Covid-19 Vaccine Bivalent Booster 8yrs & up 12/09/2020   Pneumococcal Conjugate-13 05/08/2013   Pneumococcal Polysaccharide-23 08/27/2009   Td 03/30/2008   Zoster, Live 04/13/2014    Conditions to be addressed/monitored:  Hypertension, Hyperlipidemia, Depression, BPH, Chronic Back Pain, and Hypogonadism  Care Plan : CCM Care Plan  Updates made by Tomasa Blase, RPH since 04/07/2021 12:00 AM      Problem: Hypertension, Hyperlipidemia, Depression, Chronic Back Pain, and Hypogonadism   Priority: High  Onset Date: 12/09/2020     Long-Range Goal: Disease Management   Start Date: 12/09/2020  Expected End Date: 06/08/2021  This Visit's Progress: On track  Recent Progress: On track  Priority: High  Note:   Current Barriers:  Unable to independently monitor therapeutic efficacy Unable to achieve control of cholesterol   Pharmacist Clinical Goal(s):  Patient will achieve adherence to monitoring guidelines and medication adherence to achieve therapeutic efficacy achieve improvement in LDL as evidenced by next lipid panel through collaboration with PharmD and provider.   Interventions: 1:1 collaboration with [name], [name], Bishop regarding development and update of comprehensive plan of care as evidenced by provider attestation and co-signature Inter-disciplinary care team collaboration (see longitudinal plan of care) Comprehensive medication review performed; medication list updated in electronic medical record  Hypertension (BP goal <140/90) -Controlled -Current treatment: Amlodipine 5mg  - 1 tablet daily  Propranolol 80mg  - 1 tablet 3 times daily  -Medications previously tried: amlodipine 10mg  daily (caused edema) , benazepril, HCTZ, torsemide  -Current home readings: reports that blood pressures average 130/75 -Current dietary habits: reports to drinking 1 cup of coffee daily, does not use much salt  -Current exercise habits: minimal at this time due to back pain -Denies hypotensive/hypertensive symptoms -Educated on BP goals and benefits of medications for prevention of heart attack, stroke and kidney damage; Daily salt intake goal < 2300 mg; Exercise goal of 150 minutes per week; Importance of home blood pressure monitoring; Proper BP monitoring technique; -Counseled to monitor BP at home 1-2 times weekly, document, and provide log at future appointments -Counseled on  diet and exercise extensively Recommended to continue current medication  Hyperlipidemia: (LDL goal < 100) -Not ideally controlled Lab Results  Component Value Date   LDLCALC 120 (H) 12/20/2019  -Current treatment: N/a - has refused statins in the past -Medications previously tried: aspirin  -Current dietary patterns: reports that he is trying to reduce amount of fried/ fatty foods he is eating - has lost 40lbs over the past year form dietary changes -Current exercise habits: limited at this time due to back pain  -Educated on Cholesterol goals;  Benefits of statin for ASCVD risk reduction; Importance of limiting foods high in cholesterol; -Counseled on diet and exercise extensively Recommended for patient to start rosuvastatin 5mg  daily - to discuss with PCP at next appointment   Depression/Insomnia (Goal: promotion of positive mood/ improved sleep quality) -Controlled -Current treatment: Fluoxetine 40mg  - 1 capsule daily  Trazodone 50mg  - 1-2 tablets at bedtime  -Medications previously tried/failed: bupropion, abilify, cymbalta, temazepam, clonazepam  -PHQ2 -9: 0 -Educated on Benefits of medication for symptom control Benefits of cognitive-behavioral therapy with or without medication -Recommended to continue current medication  Chronic Pain (Goal: Pain control) - Managed by Dr. Nelva Bush -Improved - patient reports that right sided pain has subsided since most recent procedure - no longer using morphine for pain control, using oxycodon/APAP no more than once daily  -last MRI shows - severe canal stenosis, severe foraminal stenosis, in lumbar vertebrae  -Current treatment  Oxycodone/APAP 10-325mg  - 1 tablet daily as needed  Gabapentin 300mg  - 1 capsule 3 times daily  Tizanidine 4mg  - 1 tablet every 8 hours as needed  Acetaminophen 500mg  - 2 tablets daily  -Medications previously tried: cyclobenzaprine, norco, ibuprofen,  morphine ER   - will defer pain management to Dr.  Nelva Bush  Hypogonadism (Goal: Maintenance of appropriate testosterone levels) -Controlled Lab Results  Component Value Date   TESTOSTERONE 166.24 (L) 10/19/2018  -Current treatment  Testosterone cypionate 216m/mL - inject 3 mL every 12 days  -Medications previously tried: n/a  -Recommended to continue current medication'  Health Maintenance -Vaccine gaps: Shingles, Influenza, COVID booster -Current therapy: Miralax - 17g twice daily  Lubricant eye drops - 1 drop into each eye 3 times daily as needed  Vitamin B12 10061m - 1 tablet daily  Lab Results  Component Value Date   VITAMINB12 657 04/05/2019  Vitamin D3 1000units - 1 tablet daily  Last vitamin D Lab Results  Component Value Date   VD25OH 35.78 04/05/2019  -Educated on Cost vs benefit of each product must be carefully weighed by individual consumer -Patient is satisfied with current therapy and denies issues -Recommended to continue current medication  Patient Goals/Self-Care Activities Patient will:  - take medications as prescribed check blood pressure 1-2 times weekly, document, and provide at future appointments -Continue dietary changes, reducing fried and fatty foods / increase fresh ingredients and lean proteins   Follow Up Plan: Telephone follow up appointment with care management team member scheduled for: 3 months  The patient has been provided with contact information for the care management team and has been advised to call with any health related questions or concerns.     Medication Assistance: None required.  Patient affirms current coverage meets needs.  [facility] [facility] [place]  [place] [place]one: [phone]: [phone]  Uses pill box? Yes Pt endorses 100% compliance  Care Plan and Follow Up Patient Decision:  Patient agrees to Care Plan and Follow-up.  Plan: Telephone follow up  appointment with care management team member scheduled for:  4 months and The patient has been provided with contact information for the care management team and has been advised to call with any health related questions or concerns.   DaTomasa BlasePharmD Clinical Pharmacist, LeJacksonportcreening examination/treatment/procedure(s) were performed by non-physician practitioner and as supervising physician I was immediately available for consultation/collaboration.  I agree with above. AlLew DawesMD

## 2021-04-07 NOTE — Patient Instructions (Signed)
Visit Information  Following are the goals we discussed today:   Track and Manage My Blood Pressure   Timeframe:  Long-Range Goal Priority:  High Start Date:  12/09/2020                       Expected End Date: 04/10/2022            Follow Up Date 07/07/2021   - check blood pressure 3 times per week - choose a place to take my blood pressure (home, clinic or office, retail store) - write blood pressure results in a log or diary    Why is this important?   You won't feel high blood pressure, but it can still hurt your blood vessels.  High blood pressure can cause heart or kidney problems. It can also cause a stroke.  Making lifestyle changes like losing a little weight or eating less salt will help.  Checking your blood pressure at home and at different times of the day can help to control blood pressure.  If the doctor prescribes medicine remember to take it the way the doctor ordered.  Call the office if you cannot afford the medicine or if there are questions about it.  Plan: Telephone follow up appointment with care management team member scheduled for:  3 months  The patient has been provided with contact information for the care management team and has been advised to call with any health related questions or concerns.   Tomasa Blase, PharmD Clinical Pharmacist, [facility]   Please call the care guide team at [phone] if you need to cancel or reschedule your appointment.   Patient verbalizes understanding of instructions provided today and agrees to view in [place].

## 2021-04-10 ENCOUNTER — Ambulatory Visit (INDEPENDENT_AMBULATORY_CARE_PROVIDER_SITE_OTHER): Payer: PPO | Admitting: Adult Health

## 2021-04-10 ENCOUNTER — Other Ambulatory Visit: Payer: Self-pay

## 2021-04-10 ENCOUNTER — Encounter: Payer: Self-pay | Admitting: Adult Health

## 2021-04-10 DIAGNOSIS — Z9989 Dependence on other enabling machines and devices: Secondary | ICD-10-CM | POA: Diagnosis not present

## 2021-04-10 DIAGNOSIS — G4733 Obstructive sleep apnea (adult) (pediatric): Secondary | ICD-10-CM

## 2021-04-10 NOTE — Progress Notes (Signed)
@Patient  ID: Vernon Bishop, male    DOB: 1940/05/18, 81 y.o.   MRN: [id number]  Chief Complaint  Patient presents with   Follow-up    Referring provider: PlotnikovEvie Lacks, MD  HPI: 81 year old followed for severe sleep apnea on nocturnal CPAP Medical history significant for DM and HTN .   TEST/EVENTS :  NPSG 2002:  AHI 80/hr.  CPAP 12cm optimal   04/10/2021 Follow up ; OSA  Patient returns for a follow-up visit.  Patient has underlying severe obstructive sleep apnea.  Patient is on nocturnal CPAP.  Patient says he is doing well on CPAP.  He wears it every single night.  Patient typically gets in about 6 to 7 hours each night.  Patient says he feels rested with no significant daytime sleepiness.  CPAP download shows excellent compliance with 93% usage.  Daily average usage at 7 hours.  Patient is on CPAP 12 cm H2O.  AHI 2.2/hour.  Positive mask leaks. Got new machine November 2022.   Recent lumbar fusion last month , doing very well. Back pain is much better. Did well surgery , wore CPAP at hospital.  Starts PT later this month. Staying active at home.  Wearing back brace.    Allergies  Allergen Reactions   Abilify [Aripiprazole] Shortness Of Breath    SOB feeling   Cymbalta [Duloxetine Hcl]     constipation   Digoxin And Related     HR 41   Prednisolone Swelling    Edema, wt gain   Temazepam     Pt unsure of this     Immunization History  Administered Date(s) Administered   Fluad Quad(high Dose 65+) 11/22/2018, 12/20/2019   Influenza Split 01/28/2011, 12/18/2011   Influenza Whole 02/14/2007, 12/11/2009   Influenza, High Dose Seasonal PF 02/03/2016, 12/30/2016, 01/04/2018, 12/09/2020   Influenza,inj,Quad PF,6+ Mos 01/03/2013, 01/10/2014, 12/05/2014   PFIZER(Purple Top)SARS-COV-2 Vaccination 04/19/2019, 05/10/2019, 12/24/2019, 07/05/2020   PNEUMOCOCCAL CONJUGATE-20 08/30/2020   Pfizer Covid-19 Vaccine Bivalent Booster 65yrs & up 12/09/2020   Pneumococcal  Conjugate-13 05/08/2013   Pneumococcal Polysaccharide-23 08/27/2009   Td 03/30/2008   Zoster, Live 04/13/2014    Past Medical History:  Diagnosis Date   Arthralgia    Atrial fibrillation ([place])    1992   Benign prostatic hyperplasia    Bradycardia    "my doctor says I have bradycardia, my heart rate is always in the 40s"   DDD (degenerative disc disease)    Depression    Diabetes mellitus    type II  - PT STATES NOT ON ANY DIABETIC MEDICINES   Dysrhythmia    aFIB   Dyssomnia    ED (erectile dysfunction)    Hyperlipidemia    Hypertension    Hypogonadism male    Joint pain    LBP (low back pain)    Multiple actinic keratoses    Muscle weakness    Upper limb   MVP (mitral valve prolapse)    Neck pain    OSA on CPAP    cpap   Osteoarthritis of hip    Osteoarthritis, hand    SEVERE LOWER BACK PAIN, AND RESTLESS LEG SYNDROME   Otalgia of right ear    Pain in left knee    Pain in thoracic spine    Palpitations    PTSD (post-traumatic stress disorder)    Shoulder pain    Subjective visual disturbance    Swallowing problem    HX OF PILLS GETTING "STUCK" IN THROAT AT  TIMES   Swelling    Type 2 diabetes mellitus ([facility])    pt states he has never had it   Vitamin D deficiency     Tobacco History: Social History   Tobacco Use  Smoking Status Former   Packs/day: 0.50   Years: 12.00   Pack years: 6.00   Types: Cigarettes   Quit date: 03/31/1971   Years since quitting: 50.0  Smokeless Tobacco Never  Tobacco Comments   Started at age 28   Counseling given: Not Answered Tobacco comments: Started at age 34   Outpatient Medications Prior to Visit  Medication Sig Dispense Refill   acetaminophen (TYLENOL) 500 MG tablet Take 1,000 mg by mouth every 8 (eight) hours as needed for moderate pain.     amLODipine (NORVASC) 5 MG tablet Take 1 tablet (5 mg total) by mouth daily. 90 tablet 3   Cholecalciferol (VITAMIN D3 PO) Take 1 tablet by mouth daily.     Cyanocobalamin  (VITAMIN B-12 CR) 1000 MCG TBCR Take 1,000 mcg by mouth daily.       FLUoxetine (PROZAC) 40 MG capsule TAKE 1 CAPSULE BY MOUTH DAILY. 90 capsule 2   furosemide (LASIX) 20 MG tablet Take 1 tablet (20 mg total) by mouth daily. 30 tablet 3   gabapentin (NEURONTIN) 300 MG capsule TAKE 1 CAPSULE BY MOUTH THREE TIMES A DAY 90 capsule 11   ondansetron (ZOFRAN) 4 MG tablet Take 1 tablet (4 mg total) by mouth every 8 (eight) hours as needed for nausea or vomiting. 20 tablet 0   oxyCODONE-acetaminophen (PERCOCET) 10-325 MG tablet Take 1 tablet by mouth daily as needed for pain.     Polyethyl Glycol-Propyl Glycol (LUBRICANT EYE DROPS) 0.4-0.3 % SOLN Place 1 drop into both eyes 3 (three) times daily as needed (dry/irritated eyes.).     polyethylene glycol (MIRALAX / GLYCOLAX) 17 g packet Take 17 g by mouth 2 (two) times daily. (Patient taking differently: Take 17 g by mouth every other day.) 28 packet 0   propranolol (INDERAL) 80 MG tablet TAKE 1 TABLET BY MOUTH 3 TIMES DAILY. 270 tablet 3   sodium chloride (OCEAN) 0.65 % SOLN nasal spray Place 1 spray into both nostrils as needed for congestion.     testosterone cypionate (DEPOTESTOSTERONE CYPIONATE) 200 MG/ML injection INJECT 3 MILLILITERS INTO THE MUSCLE EVERY 14 DAYS 10 mL 5   traZODone (DESYREL) 50 MG tablet TAKE 1 TO 2 TABLETS BY MOUTH AT BEDTIME. 60 tablet 5   zinc gluconate 50 MG tablet Take 50 mg by mouth daily.     No facility-administered medications prior to visit.     Review of Systems:   Constitutional:   No  weight loss, night sweats,  Fevers, chills, fatigue, or  lassitude.  HEENT:   No headaches,  Difficulty swallowing,  Tooth/dental problems, or  Sore throat,                No sneezing, itching, ear ache, nasal congestion, post nasal drip,   CV:  No chest pain,  Orthopnea, PND, swelling in lower extremities, anasarca, dizziness, palpitations, syncope.   GI  No heartburn, indigestion, abdominal pain, nausea, vomiting, diarrhea, change  in bowel habits, loss of appetite, bloody stools.   Resp: No shortness of breath with exertion or at rest.  No excess mucus, no productive cough,  No non-productive cough,  No coughing up of blood.  No change in color of mucus.  No wheezing.  No chest wall deformity  Skin:  no rash or lesions.  GU: no dysuria, change in color of urine, no urgency or frequency.  No flank pain, no hematuria   MS:  No joint pain or swelling.  No decreased range of motion.     Physical Exam  BP (!) 160/68 (BP Location: Left Arm, Patient Position: Sitting, Cuff Size: Large)    Pulse (!) 59    Temp 98.6 F (37 C) (Oral)    Ht 6' (1.829 m)    Wt 212 lb (96.2 kg)    SpO2 97%    BMI 28.75 kg/m   GEN: A/Ox3; pleasant , NAD, well nourished    HEENT:  [place]/AT,   , NOSE-clear, THROAT-clear, no lesions, no postnasal drip or exudate noted.  Class 2-3 MP airway   NECK:  Supple w/ fair ROM; no JVD; normal carotid impulses w/o bruits; no thyromegaly or nodules palpated; no lymphadenopathy.    RESP  Clear  P & A; w/o, wheezes/ rales/ or rhonchi. no accessory muscle use, no dullness to percussion  CARD:  RRR, no m/r/g, no peripheral edema, pulses intact, no cyanosis or clubbing.  GI:   Soft & nt; nml bowel sounds; no organomegaly or masses detected.   Musco: Warm bil, no deformities or joint swelling noted. Back brace   Neuro: alert, no focal deficits noted.    Skin: Warm, no lesions or rashes    Lab Results:      BNP No results found for: BNP    No flowsheet data found.  No results found for: NITRICOXIDE      Assessment & Plan:   OSA on CPAP Excellent control and compliance on CPAP   Plan  Patient Instructions  Continue on CPAP At bedtime   Work on weight loss.  Do not drive if sleepy.  Follow up with Dr. Elsworth Soho in 1 year and As needed           Rexene Edison, NP 04/10/2021

## 2021-04-10 NOTE — Patient Instructions (Signed)
Continue on CPAP At bedtime   Work on weight loss.  Do not drive if sleepy.  Follow up with Dr. Elsworth Soho in 1 year and As needed

## 2021-04-10 NOTE — Assessment & Plan Note (Signed)
Excellent control and compliance on CPAP   Plan  Patient Instructions  Continue on CPAP At bedtime   Work on weight loss.  Do not drive if sleepy.  Follow up with Dr. Elsworth Soho in 1 year and As needed

## 2021-04-17 ENCOUNTER — Other Ambulatory Visit: Payer: Self-pay | Admitting: Internal Medicine

## 2021-04-22 DIAGNOSIS — Z4889 Encounter for other specified surgical aftercare: Secondary | ICD-10-CM | POA: Diagnosis not present

## 2021-04-24 DIAGNOSIS — M5451 Vertebrogenic low back pain: Secondary | ICD-10-CM | POA: Diagnosis not present

## 2021-04-25 ENCOUNTER — Inpatient Hospital Stay: Payer: PPO

## 2021-04-25 ENCOUNTER — Other Ambulatory Visit: Payer: Self-pay

## 2021-04-25 ENCOUNTER — Encounter: Payer: Self-pay | Admitting: Cardiology

## 2021-04-25 ENCOUNTER — Ambulatory Visit: Payer: PPO | Admitting: Cardiology

## 2021-04-25 VITALS — BP 150/71 | HR 71 | Temp 97.6°F | Resp 16 | Ht 72.0 in | Wt 208.0 lb

## 2021-04-25 DIAGNOSIS — I272 Pulmonary hypertension, unspecified: Secondary | ICD-10-CM

## 2021-04-25 DIAGNOSIS — Z8679 Personal history of other diseases of the circulatory system: Secondary | ICD-10-CM

## 2021-04-25 DIAGNOSIS — I10 Essential (primary) hypertension: Secondary | ICD-10-CM | POA: Diagnosis not present

## 2021-04-25 DIAGNOSIS — R002 Palpitations: Secondary | ICD-10-CM | POA: Diagnosis not present

## 2021-04-25 DIAGNOSIS — G4733 Obstructive sleep apnea (adult) (pediatric): Secondary | ICD-10-CM

## 2021-04-25 DIAGNOSIS — Z9989 Dependence on other enabling machines and devices: Secondary | ICD-10-CM | POA: Diagnosis not present

## 2021-04-25 NOTE — Progress Notes (Signed)
Patient referred by [name], [name], MD for pulmonary hypertension, preoperative risk stratification  Subjective:   Vernon Bishop, male    DOB: June 14, 1940, 81 y.o.   MRN: [id number]  Chief Complaint  Patient presents with   pulmonary hypertension   Follow-up    3 month     HPI  81 y.o. Caucasian male with hypertension, OSA, remote h/o Afib, pulmonary hypertension  Patient underwent lumbar fusion surgery without any significant cardiac issues. Blood pressure is elevated today. He has upcoming f/u w/his PCP Dr Alain Marion soon.   Initial consultation visit 12/2020: Patient is here with his wife today.  He was originally scheduled to undergo L4-L5 OLIF surgery with Dr. Rolena Infante, assisted by Dr. Carlis Abbott abdominal exposure.  However, surgery was delayed due to recent finding of pulmonary hypertension on echocardiogram.  He was referred to me for evaluation and management of the same, along with preoperative cardiac risk stratification.  Patient is retired, previously worked as a Programmer, applications.  Currently, his physical activity is significantly limited due to his back pain.  He walks with a cane or a grocery store cart when he is shopping.  He does not have to climb a flight of stairs anywhere.  He walks with a slow pace.  With this level of physical activity, he denies any significant chest pain or shortness of breath symptoms.  He has had longstanding history of OSA, on CPAP for at least 10 years.  He quit smoking in 1970s.  He had atrial fibrillation in 1992, after which he was hospitalized for a day.  He had spontaneous self conversion back to sinus rhythm.  He did report significant palpitations with his A. fib in 1992.  Since then, he has not experienced similar palpitation symptoms, but does report "skipped beat" every now and then.   Current Outpatient Medications on File Prior to Visit  Medication Sig Dispense Refill   acetaminophen (TYLENOL) 500 MG tablet Take 1,000 mg by mouth  every 8 (eight) hours as needed for moderate pain.     amLODipine (NORVASC) 5 MG tablet Take 1 tablet (5 mg total) by mouth daily. 90 tablet 3   Cholecalciferol (VITAMIN D3 PO) Take 1 tablet by mouth daily.     Cyanocobalamin (VITAMIN B-12 CR) 1000 MCG TBCR Take 1,000 mcg by mouth daily.       FLUoxetine (PROZAC) 40 MG capsule TAKE 1 CAPSULE BY MOUTH DAILY. 90 capsule 2   furosemide (LASIX) 20 MG tablet Take 1 tablet (20 mg total) by mouth daily. 30 tablet 3   gabapentin (NEURONTIN) 300 MG capsule TAKE 1 CAPSULE BY MOUTH THREE TIMES A DAY 90 capsule 11   ondansetron (ZOFRAN) 4 MG tablet Take 1 tablet (4 mg total) by mouth every 8 (eight) hours as needed for nausea or vomiting. 20 tablet 0   oxyCODONE-acetaminophen (PERCOCET) 10-325 MG tablet Take 1 tablet by mouth daily as needed for pain.     Polyethyl Glycol-Propyl Glycol (LUBRICANT EYE DROPS) 0.4-0.3 % SOLN Place 1 drop into both eyes 3 (three) times daily as needed (dry/irritated eyes.).     polyethylene glycol (MIRALAX / GLYCOLAX) 17 g packet Take 17 g by mouth 2 (two) times daily. (Patient taking differently: Take 17 g by mouth every other day.) 28 packet 0   propranolol (INDERAL) 80 MG tablet TAKE 1 TABLET BY MOUTH 3 TIMES DAILY. 270 tablet 3   sodium chloride (OCEAN) 0.65 % SOLN nasal spray Place 1 spray into both nostrils as  needed for congestion.     testosterone cypionate (DEPOTESTOSTERONE CYPIONATE) 200 MG/ML injection INJECT 3 MILLILITERS INTO THE MUSCLE EVERY 14 DAYS 10 mL 5   tiZANidine (ZANAFLEX) 4 MG tablet TAKE 1 TABLET BY MOUTH EVERY 8 HOURS AS NEEDED FOR MUSCLE SPASMS. 60 tablet 1   traZODone (DESYREL) 50 MG tablet TAKE 1 TO 2 TABLETS BY MOUTH AT BEDTIME. 60 tablet 5   zinc gluconate 50 MG tablet Take 50 mg by mouth daily.     No current facility-administered medications on file prior to visit.    Cardiovascular and other pertinent studies:  Lexiscan Tetrofosmin stress test 01/31/2021: Lexiscan nuclear stress test performed  using 1-day protocol. SPECT images show decreased tracer uptake in inferior myocardium, more prominent at rest. In absence of myocardial thickening or wall motion abnormality, this likely represents attenuation artifact. Stress LVEF 52%. Low risk study.  EKG 01/22/2021: Sinus bradycardia 48 bpm   Echocardiogram 01/15/2021:  1. Left ventricular ejection fraction, by estimation, is 55 to 60%. The  left ventricle has normal function. The left ventricle has no regional  wall motion abnormalities. Left ventricular diastolic parameters are  consistent with Grade I diastolic dysfunction (impaired relaxation).   2. Right ventricular systolic function is normal. The right ventricular  size is normal. There is moderately elevated pulmonary artery systolic  pressure. The estimated right ventricular systolic pressure is 00.7 mmHg.   3. Left atrial size was moderately dilated.   4. The mitral valve is normal in structure. Trivial mitral valve  regurgitation. No evidence of mitral stenosis.   5. The aortic valve is normal in structure. Aortic valve regurgitation is  trivial. No aortic stenosis is present.   6. Aortic dilatation noted. There is mild dilatation of the ascending  aorta, measuring 40 mm.   7. The inferior vena cava is normal in size with greater than 50%  respiratory variability, suggesting right atrial pressure of 3 mmHg.   Comparison(s): Changes from prior study are noted. 05/09/12 EF 55-60%. PA  pressure 30mHg.   Recent labs: 01/14/2021: Glucose 79, BUN/Cr 18/1.01. EGFR >60. Na/K 138/3.9. Rest of the CMP normal H/H 15/46. MCV 91. Platelets 174  2021: Chol 178, TG 79, HDL 37, LDL 120    Review of Systems  Cardiovascular:  Positive for palpitations. Negative for chest pain, dyspnea on exertion, leg swelling and syncope.  Musculoskeletal:  Positive for back pain.        Vitals:   04/25/21 1248  BP: (!) 168/72  Pulse: 66  Resp: 16  Temp: 97.6 F (36.4 C)  SpO2: 95%      Body mass index is 28.21 kg/m. Filed Weights   04/25/21 1248  Weight: 208 lb (94.3 kg)     Objective:   Physical Exam Vitals and nursing note reviewed.  Constitutional:      General: He is not in acute distress. Neck:     Vascular: No JVD.  Cardiovascular:     Rate and Rhythm: Regular rhythm. Bradycardia present.     Heart sounds: Normal heart sounds. No murmur heard. Pulmonary:     Effort: Pulmonary effort is normal.     Breath sounds: Normal breath sounds. No wheezing or rales.  Musculoskeletal:     Right lower leg: No edema.     Left lower leg: No edema.         Assessment & Recommendations:    81y.o. Caucasian male with hypertension, OSA, PAF, pulmonary hypertension  Pulmonary hypertension: I personally reviewed and  independently interpreted prior EKG as an echocardiogram. Patient has moderate left atrial dilatation, grade 1 diastolic dysfunction with preserved LVEF, no right-sided dilation, moderate TR, with estimated RVSP 45 mmHg. 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   He is clinically stable and does not need any further cardiac workup at this time.   He has remote h/o Afib and moderate LA dilatation on echocardiogram. Recommend 2 week cardiac telemetry for screening.   Hypertension: Blood pressure elevated today, usually well controlled.  No change made to his antihypertensive therapy today. He has f/u w/PCP soon. Could consider adding ACEi/ARB/diuretic, if needed.   F/u as needed   Nigel Mormon, MD Pager: [phone] Office: [phone]

## 2021-04-29 DIAGNOSIS — I10 Essential (primary) hypertension: Secondary | ICD-10-CM

## 2021-04-30 DIAGNOSIS — G4733 Obstructive sleep apnea (adult) (pediatric): Secondary | ICD-10-CM | POA: Diagnosis not present

## 2021-05-01 ENCOUNTER — Telehealth (INDEPENDENT_AMBULATORY_CARE_PROVIDER_SITE_OTHER): Payer: PPO | Admitting: Nurse Practitioner

## 2021-05-01 ENCOUNTER — Encounter: Payer: Self-pay | Admitting: Nurse Practitioner

## 2021-05-01 ENCOUNTER — Other Ambulatory Visit: Payer: Self-pay

## 2021-05-01 VITALS — Temp 98.2°F

## 2021-05-01 DIAGNOSIS — U071 COVID-19: Secondary | ICD-10-CM | POA: Diagnosis not present

## 2021-05-01 MED ORDER — MOLNUPIRAVIR EUA 200MG CAPSULE: 4.0000 | ORAL_CAPSULE | Freq: Two times a day (BID) | ORAL | 0 refills | Status: AC

## 2021-05-01 NOTE — Progress Notes (Signed)
An audio/visual tele-health visit was completed today for this patient. I connected with  Vernon Bishop on 05/01/21 utilizing audio/visual technology and verified that I am speaking with the correct person using two identifiers. The patient was located at their home, and I was located at the office of [place] at [facility] during the encounter. I discussed the limitations of evaluation and management by telemedicine. The patient expressed understanding and agreed to proceed.    Subjective:  Patient ID: Vernon Bishop, male    DOB: 1941-02-06  Age: 81 y.o. MRN: [id number]  CC:  Chief Complaint  Patient presents with   Covid Positive    04/30/21    Cough      HPI  This patient arrives today for the above.  Symptoms started 2 days ago but he tested positive for COVID yesterday.  He tells me symptoms were worse yesterday, however he is feeling much better today.  He does still have a cough but is not coughing as much as he was.  He did have a fever yesterday but has not had 1 today and has not taken any Tylenol or ibuprofen.  He does have a runny nose.  Past Medical History:  Diagnosis Date   Arthralgia    Atrial fibrillation ([place])    1992   Benign prostatic hyperplasia    Bradycardia    "my doctor says I have bradycardia, my heart rate is always in the 40s"   DDD (degenerative disc disease)    Depression    Diabetes mellitus    type II  - PT STATES NOT ON ANY DIABETIC MEDICINES   Dysrhythmia    aFIB   Dyssomnia    ED (erectile dysfunction)    Hyperlipidemia    Hypertension    Hypogonadism male    Joint pain    LBP (low back pain)    Multiple actinic keratoses    Muscle weakness    Upper limb   MVP (mitral valve prolapse)    Neck pain    OSA on CPAP    cpap   Osteoarthritis of hip    Osteoarthritis, hand    SEVERE LOWER BACK PAIN, AND RESTLESS LEG SYNDROME   Otalgia of right ear    Pain in left knee    Pain in thoracic spine    Palpitations     PTSD (post-traumatic stress disorder)    Shoulder pain    Subjective visual disturbance    Swallowing problem    HX OF PILLS GETTING "STUCK" IN THROAT AT TIMES   Swelling    Type 2 diabetes mellitus ([place])    pt states he has never had it   Vitamin D deficiency       Family History  Problem Relation Age of Onset   Allergies Mother    Stroke Mother        Clotting disorders   Heart disease Mother    Heart disease Father    Heart attack Father 63   Heart disease Brother    Rheum arthritis Paternal Grandmother    Other Paternal Grandmother        Rheumatism   Coronary artery disease Other    Diabetes Other    Hypertension Other    Esophageal cancer Maternal Aunt    Colon cancer Neg Hx    Rectal cancer Neg Hx    Stomach cancer Neg Hx     Social History   Social History Narrative  FAMILY HISTORY   History of CAD Male 1st degree relative <50   History Diabetes 1st degree relative   History hypertension               Social History   Tobacco Use   Smoking status: Former    Packs/day: 0.50    Years: 12.00    Pack years: 6.00    Types: Cigarettes    Quit date: 03/31/1971    Years since quitting: 50.1   Smokeless tobacco: Never   Tobacco comments:    Started at age 82  Substance Use Topics   Alcohol use: Yes    Alcohol/week: 2.0 standard drinks    Types: 2 Cans of beer per week    Comment: occasionally     Current Meds  Medication Sig   acetaminophen (TYLENOL) 500 MG tablet Take 1,000 mg by mouth every 8 (eight) hours as needed for moderate pain.   amLODipine (NORVASC) 5 MG tablet Take 1 tablet (5 mg total) by mouth daily.   Cholecalciferol (VITAMIN D3 PO) Take 1 tablet by mouth daily.   Cyanocobalamin (VITAMIN B-12 CR) 1000 MCG TBCR Take 1,000 mcg by mouth daily.     FLUoxetine (PROZAC) 40 MG capsule TAKE 1 CAPSULE BY MOUTH DAILY.   furosemide (LASIX) 20 MG tablet Take 1 tablet (20 mg total) by mouth daily.   gabapentin (NEURONTIN) 300 MG capsule TAKE 1  CAPSULE BY MOUTH THREE TIMES A DAY   molnupiravir EUA (LAGEVRIO) 200 mg CAPS capsule Take 4 capsules (800 mg total) by mouth 2 (two) times daily for 5 days.   ondansetron (ZOFRAN) 4 MG tablet Take 1 tablet (4 mg total) by mouth every 8 (eight) hours as needed for nausea or vomiting.   Polyethyl Glycol-Propyl Glycol (LUBRICANT EYE DROPS) 0.4-0.3 % SOLN Place 1 drop into both eyes 3 (three) times daily as needed (dry/irritated eyes.).   polyethylene glycol (MIRALAX / GLYCOLAX) 17 g packet Take 17 g by mouth 2 (two) times daily. (Patient taking differently: Take 17 g by mouth every other day.)   propranolol (INDERAL) 80 MG tablet TAKE 1 TABLET BY MOUTH 3 TIMES DAILY.   sodium chloride (OCEAN) 0.65 % SOLN nasal spray Place 1 spray into both nostrils as needed for congestion.   testosterone cypionate (DEPOTESTOSTERONE CYPIONATE) 200 MG/ML injection INJECT 3 MILLILITERS INTO THE MUSCLE EVERY 14 DAYS   tiZANidine (ZANAFLEX) 4 MG tablet TAKE 1 TABLET BY MOUTH EVERY 8 HOURS AS NEEDED FOR MUSCLE SPASMS.   traZODone (DESYREL) 50 MG tablet TAKE 1 TO 2 TABLETS BY MOUTH AT BEDTIME.   zinc gluconate 50 MG tablet Take 50 mg by mouth daily.    ROS:  Review of Systems  Constitutional:  Positive for fever (low-grade fever yesterday).  HENT:  Positive for congestion (rhinorrhea). Negative for sinus pain.   Respiratory:  Positive for cough. Negative for shortness of breath.   Cardiovascular:  Negative for chest pain.  Gastrointestinal:  Negative for abdominal pain and diarrhea.  Neurological:  Negative for headaches (had a headache yesterday).    Objective:   Today's Vitals: Temp 98.2 F (36.8 C)  Vitals with BMI 05/01/2021 04/25/2021 04/25/2021  Height - - 6\' 0"   Weight - - 208 lbs  BMI - - 40.9  Systolic (No Data) 811 914  Diastolic (No Data) 71 72  Pulse - 71 66  Some encounter information is confidential and restricted. Go to Review Flowsheets activity to see all data.     Physical  Exam Comprehensive physical exam not completed today as office visit was conducted remotely.  Patient appeared well over the phone, he was not coughing, he was not in acute respiratory distress.  Patient was alert and oriented, and appeared to have appropriate judgment.       Assessment and Plan   1. COVID-19      Plan: 1.  He does have comorbid conditions increasing his risk for severe disease from COVID-19.  These include diabetes and hypertension as well as his age.  I have prescribed molnupiravir, and per shared decision making he will only start it if over the next 3 days his symptoms worsen as he is feeling much better today.  He was educated that you need to start the medication within 5 days of symptom onset in order for it to be considered effective.  He was also told if symptoms worsen especially starts to feel short of breath he needs to proceed to the emergency department over the weekend he tells me he understands.   Tests ordered No orders of the defined types were placed in this encounter.     Meds ordered this encounter  Medications   molnupiravir EUA (LAGEVRIO) 200 mg CAPS capsule    Sig: Take 4 capsules (800 mg total) by mouth 2 (two) times daily for 5 days.    Dispense:  40 capsule    Refill:  0    Order Specific Question:   Supervising Provider    Answer:   Binnie Rail [[id number]]    Patient to follow-up if symptoms worsen or persist.  Ailene Ards, NP'

## 2021-05-14 DIAGNOSIS — M5451 Vertebrogenic low back pain: Secondary | ICD-10-CM | POA: Diagnosis not present

## 2021-05-16 ENCOUNTER — Other Ambulatory Visit: Payer: Self-pay | Admitting: Internal Medicine

## 2021-05-16 DIAGNOSIS — M5451 Vertebrogenic low back pain: Secondary | ICD-10-CM | POA: Diagnosis not present

## 2021-05-20 DIAGNOSIS — M5451 Vertebrogenic low back pain: Secondary | ICD-10-CM | POA: Diagnosis not present

## 2021-05-22 DIAGNOSIS — M5451 Vertebrogenic low back pain: Secondary | ICD-10-CM | POA: Diagnosis not present

## 2021-05-26 DIAGNOSIS — M5451 Vertebrogenic low back pain: Secondary | ICD-10-CM | POA: Diagnosis not present

## 2021-05-27 DIAGNOSIS — G4733 Obstructive sleep apnea (adult) (pediatric): Secondary | ICD-10-CM | POA: Diagnosis not present

## 2021-05-28 DIAGNOSIS — G4733 Obstructive sleep apnea (adult) (pediatric): Secondary | ICD-10-CM | POA: Diagnosis not present

## 2021-05-29 DIAGNOSIS — R002 Palpitations: Secondary | ICD-10-CM | POA: Diagnosis not present

## 2021-05-29 DIAGNOSIS — M5451 Vertebrogenic low back pain: Secondary | ICD-10-CM | POA: Diagnosis not present

## 2021-06-02 DIAGNOSIS — M5451 Vertebrogenic low back pain: Secondary | ICD-10-CM | POA: Diagnosis not present

## 2021-06-03 ENCOUNTER — Ambulatory Visit (INDEPENDENT_AMBULATORY_CARE_PROVIDER_SITE_OTHER): Payer: PPO | Admitting: Internal Medicine

## 2021-06-03 ENCOUNTER — Other Ambulatory Visit: Payer: Self-pay

## 2021-06-03 ENCOUNTER — Encounter: Payer: Self-pay | Admitting: Internal Medicine

## 2021-06-03 DIAGNOSIS — I480 Paroxysmal atrial fibrillation: Secondary | ICD-10-CM | POA: Diagnosis not present

## 2021-06-03 DIAGNOSIS — M5440 Lumbago with sciatica, unspecified side: Secondary | ICD-10-CM | POA: Diagnosis not present

## 2021-06-03 DIAGNOSIS — R002 Palpitations: Secondary | ICD-10-CM | POA: Diagnosis not present

## 2021-06-03 DIAGNOSIS — L219 Seborrheic dermatitis, unspecified: Secondary | ICD-10-CM

## 2021-06-03 DIAGNOSIS — I10 Essential (primary) hypertension: Secondary | ICD-10-CM | POA: Diagnosis not present

## 2021-06-03 DIAGNOSIS — E119 Type 2 diabetes mellitus without complications: Secondary | ICD-10-CM | POA: Diagnosis not present

## 2021-06-03 DIAGNOSIS — D649 Anemia, unspecified: Secondary | ICD-10-CM | POA: Diagnosis not present

## 2021-06-03 DIAGNOSIS — Z4889 Encounter for other specified surgical aftercare: Secondary | ICD-10-CM | POA: Diagnosis not present

## 2021-06-03 LAB — CBC WITH DIFFERENTIAL/PLATELET
Basophils Absolute: 0 10*3/uL (ref 0.0–0.1)
Basophils Relative: 0.8 % (ref 0.0–3.0)
Eosinophils Absolute: 0.1 10*3/uL (ref 0.0–0.7)
Eosinophils Relative: 1.9 % (ref 0.0–5.0)
HCT: 38.7 % — ABNORMAL LOW (ref 39.0–52.0)
Hemoglobin: 13.2 g/dL (ref 13.0–17.0)
Lymphocytes Relative: 28 % (ref 12.0–46.0)
Lymphs Abs: 1.6 10*3/uL (ref 0.7–4.0)
MCHC: 34.1 g/dL (ref 30.0–36.0)
MCV: 89.7 fl (ref 78.0–100.0)
Monocytes Absolute: 0.5 10*3/uL (ref 0.1–1.0)
Monocytes Relative: 7.8 % (ref 3.0–12.0)
Neutro Abs: 3.6 10*3/uL (ref 1.4–7.7)
Neutrophils Relative %: 61.5 % (ref 43.0–77.0)
Platelets: 183 10*3/uL (ref 150.0–400.0)
RBC: 4.32 Mil/uL (ref 4.22–5.81)
RDW: 14.1 % (ref 11.5–15.5)
WBC: 5.8 10*3/uL (ref 4.0–10.5)

## 2021-06-03 LAB — COMPREHENSIVE METABOLIC PANEL
ALT: 12 U/L (ref 0–53)
AST: 16 U/L (ref 0–37)
Albumin: 3.9 g/dL (ref 3.5–5.2)
Alkaline Phosphatase: 108 U/L (ref 39–117)
BUN: 19 mg/dL (ref 6–23)
CO2: 27 mEq/L (ref 19–32)
Calcium: 9.4 mg/dL (ref 8.4–10.5)
Chloride: 102 mEq/L (ref 96–112)
Creatinine, Ser: 0.83 mg/dL (ref 0.40–1.50)
GFR: 82.66 mL/min (ref >60.00)
Glucose, Bld: 80 mg/dL (ref 70–99)
Potassium: 4.2 mEq/L (ref 3.5–5.1)
Sodium: 137 mEq/L (ref 135–145)
Total Bilirubin: 0.4 mg/dL (ref 0.2–1.2)
Total Protein: 6.8 g/dL (ref 6.0–8.3)

## 2021-06-03 LAB — TSH: TSH: 2.18 u[IU]/mL (ref 0.35–5.50)

## 2021-06-03 LAB — HEMOGLOBIN A1C: Hgb A1c MFr Bld: 5 % (ref 4.6–6.5)

## 2021-06-03 MED ORDER — CLOTRIMAZOLE-BETAMETHASONE 1-0.05 % EX CREA: 1.0000 "application " | TOPICAL_CREAM | Freq: Two times a day (BID) | CUTANEOUS | 1 refills | Status: DC

## 2021-06-03 NOTE — Assessment & Plan Note (Signed)
Cont w/Lotensin HCT, Amlodipine, Inderal ?

## 2021-06-03 NOTE — Assessment & Plan Note (Signed)
Resolved ? ?05/30/21 Patch Wear Time:  13 days and 22 hours (2023-01-27T13:23:04-498 to 2023-02-10T11:33:56-0500) ?? ?Patient had a min HR of 36 bpm, max HR of 135 bpm, and avg HR of 52 bpm. Predominant underlying rhythm was Sinus Rhythm. Slight P wave morphology changes were noted. 1 run of Ventricular Tachycardia occurred lasting 5 beats with a max rate of 105 bpm  ?(avg 100 bpm). 59 Supraventricular Tachycardia runs occurred, the run with the fastest interval lasting 13 beats with a max rate of 135 bpm, the longest lasting 15.9 secs with an avg rate of 100 bpm. Isolated SVEs were occasional (2.6%, 42395), SVE  ?Couplets were rare (<1.0%, 184), and SVE Triplets were rare (<1.0%, 50). Isolated VEs were rare (<1.0%, 5676), VE Couplets were rare (<1.0%, 31), and VE Triplets were rare (<1.0%, 2). Ventricular Trigeminy was present. Some VE(s) may be SVE(s) conducted  ?with possible aberrancy. ? ?Discussed w/Tom ?Cardiology f/u ? ?

## 2021-06-03 NOTE — Assessment & Plan Note (Signed)
Check CBC 

## 2021-06-03 NOTE — Progress Notes (Signed)
Subjective:  Patient ID: Vernon Bishop, male    DOB: [birth year]  Age: 81 y.o. MRN: [id number]  CC: No chief complaint on file.   HPI Vernon Bishop presents for LBP, s/p 2 day surgery. In PT 2/wks Pain is better in the lower back F/u on palpitations - resolved   Outpatient Medications Prior to Visit  Medication Sig Dispense Refill   acetaminophen (TYLENOL) 500 MG tablet Take 1,000 mg by mouth every 8 (eight) hours as needed for moderate pain.     amLODipine (NORVASC) 5 MG tablet Take 1 tablet (5 mg total) by mouth daily. 90 tablet 3   Cholecalciferol (VITAMIN D3 PO) Take 1 tablet by mouth daily.     Cyanocobalamin (VITAMIN B-12 CR) 1000 MCG TBCR Take 1,000 mcg by mouth daily.       FLUoxetine (PROZAC) 40 MG capsule TAKE 1 CAPSULE BY MOUTH DAILY. 90 capsule 2   furosemide (LASIX) 20 MG tablet TAKE 1 TABLET (20 MG TOTAL) BY MOUTH DAILY. 30 tablet 3   gabapentin (NEURONTIN) 300 MG capsule TAKE 1 CAPSULE BY MOUTH THREE TIMES A DAY 90 capsule 11   ondansetron (ZOFRAN) 4 MG tablet Take 1 tablet (4 mg total) by mouth every 8 (eight) hours as needed for nausea or vomiting. 20 tablet 0   oxyCODONE-acetaminophen (PERCOCET) 10-325 MG tablet Take 1 tablet by mouth daily as needed for pain.     Polyethyl Glycol-Propyl Glycol (LUBRICANT EYE DROPS) 0.4-0.3 % SOLN Place 1 drop into both eyes 3 (three) times daily as needed (dry/irritated eyes.).     polyethylene glycol (MIRALAX / GLYCOLAX) 17 g packet Take 17 g by mouth 2 (two) times daily. (Patient taking differently: Take 17 g by mouth every other day.) 28 packet 0   propranolol (INDERAL) 80 MG tablet TAKE 1 TABLET BY MOUTH 3 TIMES DAILY. 270 tablet 3   sodium chloride (OCEAN) 0.65 % SOLN nasal spray Place 1 spray into both nostrils as needed for congestion.     testosterone cypionate (DEPOTESTOSTERONE CYPIONATE) 200 MG/ML injection INJECT 3 MILLILITERS INTO THE MUSCLE EVERY 14 DAYS 10 mL 5   tiZANidine (ZANAFLEX) 4 MG tablet TAKE 1 TABLET BY MOUTH  EVERY 8 HOURS AS NEEDED FOR MUSCLE SPASMS. 60 tablet 1   traZODone (DESYREL) 50 MG tablet TAKE 1 TO 2 TABLETS BY MOUTH AT BEDTIME. 60 tablet 5   zinc gluconate 50 MG tablet Take 50 mg by mouth daily.     No facility-administered medications prior to visit.    ROS: Review of Systems  Constitutional:  Negative for appetite change, fatigue and unexpected weight change.  HENT:  Negative for congestion, nosebleeds, sneezing, sore throat and trouble swallowing.   Eyes:  Negative for itching and visual disturbance.  Respiratory:  Negative for cough.   Cardiovascular:  Negative for chest pain, palpitations and leg swelling.  Gastrointestinal:  Negative for abdominal distention, blood in stool, diarrhea and nausea.  Genitourinary:  Negative for frequency and hematuria.  Musculoskeletal:  Positive for arthralgias and gait problem. Negative for back pain, joint swelling and neck pain.  Skin:  Negative for rash.  Neurological:  Negative for dizziness, tremors, speech difficulty and weakness.  Psychiatric/Behavioral:  Negative for agitation, dysphoric mood and sleep disturbance. The patient is not nervous/anxious.    Objective:  BP 130/68 (BP Location: Left Arm, Patient Position: Sitting, Cuff Size: Large)    Pulse (!) 40    Temp 98.9 F (37.2 C) (Oral)    Ht 6' (1.829  m)    Wt 212 lb (96.2 kg)    SpO2 95%    BMI 28.75 kg/m   BP Readings from Last 3 Encounters:  06/03/21 130/68  04/25/21 (!) 150/71  04/10/21 (!) 160/68    Wt Readings from Last 3 Encounters:  06/03/21 212 lb (96.2 kg)  04/25/21 208 lb (94.3 kg)  04/10/21 212 lb (96.2 kg)    Physical Exam Constitutional:      General: He is not in acute distress.    Appearance: He is well-developed. He is obese.     Comments: NAD  Eyes:     Conjunctiva/sclera: Conjunctivae normal.     Pupils: Pupils are equal, round, and reactive to light.  Neck:     Thyroid: No thyromegaly.     Vascular: No JVD.  Cardiovascular:     Rate and  Rhythm: Normal rate and regular rhythm.     Heart sounds: Normal heart sounds. No murmur heard.   No friction rub. No gallop.  Pulmonary:     Effort: Pulmonary effort is normal. No respiratory distress.     Breath sounds: Normal breath sounds. No wheezing or rales.  Chest:     Chest wall: No tenderness.  Abdominal:     General: Bowel sounds are normal. There is no distension.     Palpations: Abdomen is soft. There is no mass.     Tenderness: There is no abdominal tenderness. There is no guarding or rebound.  Musculoskeletal:        General: No tenderness. Normal range of motion.     Cervical back: Normal range of motion.  Lymphadenopathy:     Cervical: No cervical adenopathy.  Skin:    General: Skin is warm and dry.     Findings: No rash.  Neurological:     Mental Status: He is alert and oriented to person, place, and time.     Cranial Nerves: No cranial nerve deficit.     Motor: No weakness or abnormal muscle tone.     Coordination: Coordination normal.     Gait: Gait abnormal.     Deep Tendon Reflexes: Reflexes are normal and symmetric.  Psychiatric:        Behavior: Behavior normal.        Thought Content: Thought content normal.        Judgment: Judgment normal.  Antalgic gait Using a cane  Mild SD  Lab Results  Component Value Date   WBC 7.9 03/14/2021   HGB 8.9 (L) 03/14/2021   HCT 26.4 (L) 03/14/2021   PLT 140 (L) 03/14/2021   GLUCOSE 100 (H) 03/14/2021   CHOL 178 12/20/2019   TRIG 99 12/20/2019   HDL 37 (L) 12/20/2019   LDLCALC 120 (H) 12/20/2019   ALT 15 03/14/2021   AST 32 03/14/2021   NA 133 (L) 03/14/2021   K 3.8 03/14/2021   CL 100 03/14/2021   CREATININE 0.89 03/14/2021   BUN 13 03/14/2021   CO2 25 03/14/2021   TSH 3.59 08/30/2020   PSA 0.73 12/20/2019   INR 1.0 01/14/2021   HGBA1C 4.9 01/14/2021   MICROALBUR 3.9 12/20/2019    DG Lumbar Spine 2-3 Views  Result Date: 03/13/2021 CLINICAL DATA:  Surgical posterior fusion extending from  L2-L5. EXAM: LUMBAR SPINE - 2-3 VIEW; DG C-ARM 1-60 MIN-NO REPORT Radiation exposure index: 330.15 mGy. COMPARISON:  February 01, 2019. FINDINGS: Five intraoperative fluoroscopic images were obtained of the lumbar spine. These demonstrate the patient be status post surgical  posterior fusion of L2-3, L3-4, and L4-5 with bilateral intrapedicular screw placement and interbody fusion. IMPRESSION: Fluoroscopic guidance provided during lower lumbar surgery. Electronically Signed   By: Marijo Conception M.D.   On: 03/13/2021 12:47   DG C-Arm 1-60 Min-No Report  Result Date: 03/13/2021 CLINICAL DATA:  Surgical posterior fusion extending from L2-L5. EXAM: LUMBAR SPINE - 2-3 VIEW; DG C-ARM 1-60 MIN-NO REPORT Radiation exposure index: 330.15 mGy. COMPARISON:  February 01, 2019. FINDINGS: Five intraoperative fluoroscopic images were obtained of the lumbar spine. These demonstrate the patient be status post surgical posterior fusion of L2-3, L3-4, and L4-5 with bilateral intrapedicular screw placement and interbody fusion. IMPRESSION: Fluoroscopic guidance provided during lower lumbar surgery. Electronically Signed   By: Marijo Conception M.D.   On: 03/13/2021 12:47   DG C-Arm 1-60 Min-No Report  Result Date: 03/13/2021 CLINICAL DATA:  Surgical posterior fusion extending from L2-L5. EXAM: LUMBAR SPINE - 2-3 VIEW; DG C-ARM 1-60 MIN-NO REPORT Radiation exposure index: 330.15 mGy. COMPARISON:  February 01, 2019. FINDINGS: Five intraoperative fluoroscopic images were obtained of the lumbar spine. These demonstrate the patient be status post surgical posterior fusion of L2-3, L3-4, and L4-5 with bilateral intrapedicular screw placement and interbody fusion. IMPRESSION: Fluoroscopic guidance provided during lower lumbar surgery. Electronically Signed   By: Marijo Conception M.D.   On: 03/13/2021 12:47   DG C-Arm 1-60 Min-No Report  Result Date: 03/13/2021 CLINICAL DATA:  Surgical posterior fusion extending from L2-L5. EXAM:  LUMBAR SPINE - 2-3 VIEW; DG C-ARM 1-60 MIN-NO REPORT Radiation exposure index: 330.15 mGy. COMPARISON:  February 01, 2019. FINDINGS: Five intraoperative fluoroscopic images were obtained of the lumbar spine. These demonstrate the patient be status post surgical posterior fusion of L2-3, L3-4, and L4-5 with bilateral intrapedicular screw placement and interbody fusion. IMPRESSION: Fluoroscopic guidance provided during lower lumbar surgery. Electronically Signed   By: Marijo Conception M.D.   On: 03/13/2021 12:47   DG C-Arm 1-60 Min-No Report  Result Date: 03/12/2021 Fluoroscopy was utilized by the requesting physician.  No radiographic interpretation.   DG C-Arm 1-60 Min-No Report  Result Date: 03/12/2021 Fluoroscopy was utilized by the requesting physician.  No radiographic interpretation.   DG C-Arm 1-60 Min-No Report  Result Date: 03/12/2021 Fluoroscopy was utilized by the requesting physician.  No radiographic interpretation.   DG C-Arm 1-60 Min-No Report  Result Date: 03/12/2021 Fluoroscopy was utilized by the requesting physician.  No radiographic interpretation.   DG C-Arm 1-60 Min-No Report  Result Date: 03/12/2021 Fluoroscopy was utilized by the requesting physician.  No radiographic interpretation.   DG OR LOCAL ABDOMEN  Result Date: 03/12/2021 CLINICAL DATA:  Status post surgical lumbar fusion. EXAM: OR LOCAL ABDOMEN COMPARISON:  None. FINDINGS: The patient appears to be status post surgical fusion of L2-3, L3-4 and L4-5. Two vascular clips are seen projected over the sacrum. Status post bilateral total hip arthroplasty. No other radiopaque foreign body is noted. IMPRESSION: Status post surgical posterior fusion of multiple levels of the lower lumbar spine. 2 vascular clips are seen over sacrum. No other radiopaque foreign body is noted. These results were called by telephone at the time of interpretation on 03/12/2021 at 6:18 pm to provider [place] in OR 4, who verbally  acknowledged these results. Electronically Signed   By: Marijo Conception M.D.   On: 03/12/2021 18:18    Assessment & Plan:   Problem List Items Addressed This Visit     Atrial fibrillation ([place])  No relapse      Relevant Orders   CBC with Differential/Platelet   TSH   Diabetes mellitus type 2, diet-controlled ([place])    Wt Readings from Last 3 Encounters:  06/03/21 212 lb (96.2 kg)  04/25/21 208 lb (94.3 kg)  04/10/21 212 lb (96.2 kg)  Lost40lbs  Check A1c       Relevant Orders   CBC with Differential/Platelet   Comprehensive metabolic panel   Hemoglobin A1c   Essential hypertension    Cont w/Lotensin HCT, Amlodipine, Inderal      LOW BACK PAIN    S/p 2 day LS spine fusion surgery. In PT 2/wks Pain is better in the lower back      Palpitations    Resolved  05/30/21 Patch Wear Time:  13 days and 22 hours (2023-01-27T13:23:04-498 to 2023-02-10T11:33:56-0500)   Patient had a min HR of 36 bpm, max HR of 135 bpm, and avg HR of 52 bpm. Predominant underlying rhythm was Sinus Rhythm. Slight P wave morphology changes were noted. 1 run of Ventricular Tachycardia occurred lasting 5 beats with a max rate of 105 bpm  (avg 100 bpm). 59 Supraventricular Tachycardia runs occurred, the run with the fastest interval lasting 13 beats with a max rate of 135 bpm, the longest lasting 15.9 secs with an avg rate of 100 bpm. Isolated SVEs were occasional (2.6%, 96295), SVE  Couplets were rare (<1.0%, 184), and SVE Triplets were rare (<1.0%, 50). Isolated VEs were rare (<1.0%, 5676), VE Couplets were rare (<1.0%, 31), and VE Triplets were rare (<1.0%, 2). Ventricular Trigeminy was present. Some VE(s) may be SVE(s) conducted  with possible aberrancy.  Discussed w/[facility] f/u       Postoperative anemia    Check CBC      Relevant Orders   CBC with Differential/Platelet   Iron, TIBC and Ferritin Panel   TSH   Seborrheic dermatitis    Use Lotrisone cream prn         Meds  ordered this encounter  Medications   clotrimazole-betamethasone (LOTRISONE) cream    Sig: Apply 1 application. topically 2 (two) times daily.    Dispense:  45 g    Refill:  1      Follow-up: No follow-ups on file.  Walker Kehr, MD

## 2021-06-03 NOTE — Assessment & Plan Note (Signed)
Wt Readings from Last 3 Encounters:  ?06/03/21 212 lb (96.2 kg)  ?04/25/21 208 lb (94.3 kg)  ?04/10/21 212 lb (96.2 kg)  ?Lost40lbs ? ?Check A1c ? ?

## 2021-06-03 NOTE — Assessment & Plan Note (Signed)
Use Lotrisone cream prn ?

## 2021-06-03 NOTE — Assessment & Plan Note (Signed)
S/p 2 day LS spine fusion surgery. In PT 2/wks ?Pain is better in the lower back ?

## 2021-06-03 NOTE — Assessment & Plan Note (Signed)
No relapse 

## 2021-06-04 LAB — IRON,TIBC AND FERRITIN PANEL
%SAT: 26 % (calc) (ref 20–48)
Ferritin: 253 ng/mL (ref 24–380)
Iron: 79 ug/dL (ref 50–180)
TIBC: 306 mcg/dL (calc) (ref 250–425)

## 2021-06-08 ENCOUNTER — Other Ambulatory Visit: Payer: Self-pay

## 2021-06-08 ENCOUNTER — Emergency Department (HOSPITAL_COMMUNITY): Payer: PPO

## 2021-06-08 ENCOUNTER — Encounter (HOSPITAL_COMMUNITY): Payer: Self-pay

## 2021-06-08 ENCOUNTER — Observation Stay (HOSPITAL_COMMUNITY)
Admission: EM | Admit: 2021-06-08 | Discharge: 2021-06-09 | Disposition: A | Discharge status: Home / Self Care | Payer: PPO | Attending: Internal Medicine | Admitting: Internal Medicine

## 2021-06-08 ENCOUNTER — Observation Stay (HOSPITAL_COMMUNITY): Payer: PPO

## 2021-06-08 DIAGNOSIS — Z8673 Personal history of transient ischemic attack (TIA), and cerebral infarction without residual deficits: Secondary | ICD-10-CM | POA: Diagnosis present

## 2021-06-08 DIAGNOSIS — Z87891 Personal history of nicotine dependence: Secondary | ICD-10-CM | POA: Diagnosis not present

## 2021-06-08 DIAGNOSIS — I63233 Cerebral infarction due to unspecified occlusion or stenosis of bilateral carotid arteries: Secondary | ICD-10-CM | POA: Diagnosis not present

## 2021-06-08 DIAGNOSIS — I480 Paroxysmal atrial fibrillation: Secondary | ICD-10-CM

## 2021-06-08 DIAGNOSIS — Z9989 Dependence on other enabling machines and devices: Secondary | ICD-10-CM | POA: Diagnosis not present

## 2021-06-08 DIAGNOSIS — Z96643 Presence of artificial hip joint, bilateral: Secondary | ICD-10-CM | POA: Diagnosis not present

## 2021-06-08 DIAGNOSIS — R531 Weakness: Secondary | ICD-10-CM | POA: Diagnosis present

## 2021-06-08 DIAGNOSIS — Z20822 Contact with and (suspected) exposure to covid-19: Secondary | ICD-10-CM | POA: Insufficient documentation

## 2021-06-08 DIAGNOSIS — I639 Cerebral infarction, unspecified: Principal | ICD-10-CM | POA: Insufficient documentation

## 2021-06-08 DIAGNOSIS — I672 Cerebral atherosclerosis: Secondary | ICD-10-CM | POA: Diagnosis not present

## 2021-06-08 DIAGNOSIS — I959 Hypotension, unspecified: Secondary | ICD-10-CM | POA: Diagnosis not present

## 2021-06-08 DIAGNOSIS — G4733 Obstructive sleep apnea (adult) (pediatric): Secondary | ICD-10-CM

## 2021-06-08 DIAGNOSIS — R471 Dysarthria and anarthria: Secondary | ICD-10-CM | POA: Insufficient documentation

## 2021-06-08 DIAGNOSIS — R2689 Other abnormalities of gait and mobility: Secondary | ICD-10-CM | POA: Insufficient documentation

## 2021-06-08 DIAGNOSIS — Z79899 Other long term (current) drug therapy: Secondary | ICD-10-CM | POA: Insufficient documentation

## 2021-06-08 DIAGNOSIS — I10 Essential (primary) hypertension: Secondary | ICD-10-CM | POA: Diagnosis not present

## 2021-06-08 DIAGNOSIS — Q283 Other malformations of cerebral vessels: Secondary | ICD-10-CM | POA: Diagnosis not present

## 2021-06-08 DIAGNOSIS — E119 Type 2 diabetes mellitus without complications: Secondary | ICD-10-CM | POA: Insufficient documentation

## 2021-06-08 DIAGNOSIS — R001 Bradycardia, unspecified: Secondary | ICD-10-CM | POA: Diagnosis not present

## 2021-06-08 DIAGNOSIS — G9389 Other specified disorders of brain: Secondary | ICD-10-CM | POA: Insufficient documentation

## 2021-06-08 DIAGNOSIS — R479 Unspecified speech disturbances: Secondary | ICD-10-CM

## 2021-06-08 DIAGNOSIS — I6340 Cerebral infarction due to embolism of unspecified cerebral artery: Secondary | ICD-10-CM

## 2021-06-08 LAB — ETHANOL: Alcohol, Ethyl (B): <10 mg/dL (ref <10)

## 2021-06-08 LAB — DIFFERENTIAL
Abs Immature Granulocytes: 0.02 10*3/uL (ref 0.00–0.07)
Basophils Absolute: 0 10*3/uL (ref 0.0–0.1)
Basophils Relative: 1 %
Eosinophils Absolute: 0.1 10*3/uL (ref 0.0–0.5)
Eosinophils Relative: 1 %
Immature Granulocytes: 0 %
Lymphocytes Relative: 23 %
Lymphs Abs: 1.4 10*3/uL (ref 0.7–4.0)
Monocytes Absolute: 0.5 10*3/uL (ref 0.1–1.0)
Monocytes Relative: 9 %
Neutro Abs: 4.1 10*3/uL (ref 1.7–7.7)
Neutrophils Relative %: 66 %

## 2021-06-08 LAB — RAPID URINE DRUG SCREEN, HOSP PERFORMED
Amphetamines: NOT DETECTED
Barbiturates: NOT DETECTED
Benzodiazepines: NOT DETECTED
Cocaine: NOT DETECTED
Opiates: NOT DETECTED
Tetrahydrocannabinol: NOT DETECTED

## 2021-06-08 LAB — CBC
HCT: 42.2 % (ref 39.0–52.0)
Hemoglobin: 14.2 g/dL (ref 13.0–17.0)
MCH: 30.9 pg (ref 26.0–34.0)
MCHC: 33.6 g/dL (ref 30.0–36.0)
MCV: 91.7 fL (ref 80.0–100.0)
Platelets: 199 10*3/uL (ref 150–400)
RBC: 4.6 MIL/uL (ref 4.22–5.81)
RDW: 13.2 % (ref 11.5–15.5)
WBC: 6.1 10*3/uL (ref 4.0–10.5)
nRBC: 0 % (ref 0.0–0.2)

## 2021-06-08 LAB — URINALYSIS, ROUTINE W REFLEX MICROSCOPIC
Bilirubin Urine: NEGATIVE
Glucose, UA: NEGATIVE mg/dL
Hgb urine dipstick: NEGATIVE
Ketones, ur: NEGATIVE mg/dL
Leukocytes,Ua: NEGATIVE
Nitrite: NEGATIVE
Protein, ur: NEGATIVE mg/dL
Specific Gravity, Urine: 1.009 (ref 1.005–1.030)
pH: 7 (ref 5.0–8.0)

## 2021-06-08 LAB — COMPREHENSIVE METABOLIC PANEL
ALT: 19 U/L (ref 0–44)
AST: 23 U/L (ref 15–41)
Albumin: 3.6 g/dL (ref 3.5–5.0)
Alkaline Phosphatase: 107 U/L (ref 38–126)
Anion gap: 10 (ref 5–15)
BUN: 15 mg/dL (ref 8–23)
CO2: 25 mmol/L (ref 22–32)
Calcium: 9.2 mg/dL (ref 8.9–10.3)
Chloride: 101 mmol/L (ref 98–111)
Creatinine, Ser: 0.88 mg/dL (ref 0.61–1.24)
GFR, Estimated: >60 mL/min (ref >60)
Glucose, Bld: 93 mg/dL (ref 70–99)
Potassium: 4.1 mmol/L (ref 3.5–5.1)
Sodium: 136 mmol/L (ref 135–145)
Total Bilirubin: 0.6 mg/dL (ref 0.3–1.2)
Total Protein: 6.7 g/dL (ref 6.5–8.1)

## 2021-06-08 LAB — I-STAT CHEM 8, ED
BUN: 17 mg/dL (ref 8–23)
Calcium, Ion: 1.19 mmol/L (ref 1.15–1.40)
Chloride: 102 mmol/L (ref 98–111)
Creatinine, Ser: 0.8 mg/dL (ref 0.61–1.24)
Glucose, Bld: 88 mg/dL (ref 70–99)
HCT: 41 % (ref 39.0–52.0)
Hemoglobin: 13.9 g/dL (ref 13.0–17.0)
Potassium: 4.2 mmol/L (ref 3.5–5.1)
Sodium: 139 mmol/L (ref 135–145)
TCO2: 28 mmol/L (ref 22–32)

## 2021-06-08 LAB — PROTIME-INR
INR: 1.1 (ref 0.8–1.2)
Prothrombin Time: 13.8 seconds (ref 11.4–15.2)

## 2021-06-08 LAB — APTT: aPTT: 31 seconds (ref 24–36)

## 2021-06-08 LAB — RESP PANEL BY RT-PCR (FLU A&B, COVID) ARPGX2
Influenza A by PCR: NEGATIVE
Influenza B by PCR: NEGATIVE
SARS Coronavirus 2 by RT PCR: NEGATIVE

## 2021-06-08 MED ORDER — ENOXAPARIN SODIUM 40 MG/0.4ML IJ SOSY
40.0000 mg | PREFILLED_SYRINGE | INTRAMUSCULAR | Status: DC
  Administered 2021-06-08: 40 mg via SUBCUTANEOUS
  Filled 2021-06-08: qty 0.4

## 2021-06-08 MED ORDER — GADOBUTROL 1 MMOL/ML IV SOLN
9.6000 mL | Freq: Once | INTRAVENOUS | Status: AC | PRN
  Administered 2021-06-08: 9.6 mL via INTRAVENOUS

## 2021-06-08 MED ORDER — STROKE: EARLY STAGES OF RECOVERY BOOK
Freq: Once | Status: AC
  Filled 2021-06-08 (×2): qty 1

## 2021-06-08 MED ORDER — IOHEXOL 350 MG/ML SOLN
100.0000 mL | Freq: Once | INTRAVENOUS | Status: AC | PRN
  Administered 2021-06-08: 100 mL via INTRAVENOUS

## 2021-06-08 MED ORDER — ASPIRIN EC 81 MG PO TBEC
81.0000 mg | DELAYED_RELEASE_TABLET | Freq: Every day | ORAL | Status: DC
  Administered 2021-06-08 – 2021-06-09 (×2): 81 mg via ORAL
  Filled 2021-06-08 (×2): qty 1

## 2021-06-08 NOTE — ED Notes (Signed)
Pt in bed, sig other at bedside, pt has large tongue and has more "hot potato" voice than slurred speech, mild slurred speech noted. Pt reports he first noticed the slurred speech yesterday when he was talking on the phone.  Pt awake and oriented.   ?

## 2021-06-08 NOTE — Assessment & Plan Note (Signed)
Continue CPAP.  

## 2021-06-08 NOTE — ED Provider Notes (Signed)
[facility] EMERGENCY DEPARTMENT Provider Note   CSN: [id number] Arrival date & time: 06/08/21  1308     History  Chief Complaint  Patient presents with   Weakness    Vernon Bishop is a 81 y.o. male.   Weakness Associated symptoms: no abdominal pain and no chest pain   Patient presents with difficulty speaking.  States it began last night.  Began yesterday.  No other numbness or weakness.  No headache.  No confusion.  States just feels as if his tongue is "thick".  Not had episodes like this before.  Not on ACE inhibitor.  Patient states he almost did not come in because he did a stroke screen at home and there was no other sign of stroke.    Home Medications Prior to Admission medications   Medication Sig Start Date End Date Taking? Authorizing Provider  acetaminophen (TYLENOL) 325 MG tablet Take 325-650 mg by mouth every 6 (six) hours as needed for mild pain or headache.   Yes [provider]  amLODipine (NORVASC) 5 MG tablet Take 1 tablet (5 mg total) by mouth daily. 08/30/20 08/30/21 Yes [name], [name], MD  Cholecalciferol (VITAMIN D3 PO) Take 1 tablet by mouth daily.   Yes [provider]  clotrimazole-betamethasone (LOTRISONE) cream Apply 1 application. topically 2 (two) times daily. Patient taking differently: Apply 1 application. topically daily as needed (to affected areas of the face). 06/03/21  Yes [name], [name], MD  Cyanocobalamin (VITAMIN B-12 CR) 1000 MCG TBCR Take 1,000 mcg by mouth daily.     Yes [provider]  FLUoxetine (PROZAC) 40 MG capsule TAKE 1 CAPSULE BY MOUTH DAILY. Patient taking differently: Take 40 mg by mouth in the morning. 04/19/21  Yes [name], [name], MD  furosemide (LASIX) 20 MG tablet TAKE 1 TABLET (20 MG TOTAL) BY MOUTH DAILY. Patient taking differently: Take 20 mg by mouth daily as needed for edema. 05/19/21  Yes [name], [name], MD  gabapentin (NEURONTIN) 300 MG capsule TAKE 1  CAPSULE BY MOUTH THREE TIMES A DAY Patient taking differently: Take 300 mg by mouth 3 (three) times daily. 11/01/20  Yes [name], [name], MD  PRESCRIPTION MEDICATION CPAP- At bedtime   Yes [provider]  propranolol (INDERAL) 80 MG tablet TAKE 1 TABLET BY MOUTH 3 TIMES DAILY. Patient taking differently: Take 80 mg by mouth 3 (three) times daily. 12/12/20  Yes [name], [name], MD  sodium chloride (OCEAN) 0.65 % SOLN nasal spray Place 1 spray into both nostrils as needed for congestion.   Yes [provider]  tetrahydrozoline 0.05 % ophthalmic solution Place 1 drop into both eyes 2 (two) times daily as needed (for redness).   Yes [provider]  tiZANidine (ZANAFLEX) 4 MG tablet TAKE 1 TABLET BY MOUTH EVERY 8 HOURS AS NEEDED FOR MUSCLE SPASMS. Patient taking differently: Take 4 mg by mouth every 8 (eight) hours as needed for muscle spasms. 04/19/21  Yes [name], [name], MD  traZODone (DESYREL) 50 MG tablet TAKE 1 TO 2 TABLETS BY MOUTH AT BEDTIME. Patient taking differently: Take 100 mg by mouth at bedtime. 02/26/21  Yes [name], [name], MD  zinc gluconate 50 MG tablet Take 50 mg by mouth daily.   Yes [provider]  ondansetron (ZOFRAN) 4 MG tablet Take 1 tablet (4 mg total) by mouth every 8 (eight) hours as needed for nausea or vomiting. Patient not taking: Reported on 06/08/2021 03/13/21   Melina Schools,  MD  polyethylene glycol (MIRALAX / GLYCOLAX) 17 g packet Take 17 g by mouth 2 (two) times daily. Patient not taking: Reported on 06/08/2021 05/24/19   Danae Orleans, PA-C  testosterone cypionate (DEPOTESTOSTERONE CYPIONATE) 200 MG/ML injection INJECT 3 MILLILITERS INTO THE MUSCLE EVERY 14 DAYS Patient not taking: Reported on 06/08/2021 10/14/20   [name], [name], MD      Allergies    Abilify [aripiprazole], Cymbalta [duloxetine hcl], Digoxin and related, Prednisolone, Temazepam, and Other    Review of Systems   Review of Systems   Constitutional:  Negative for appetite change.  HENT:  Negative for facial swelling, sore throat and trouble swallowing.   Cardiovascular:  Negative for chest pain.  Gastrointestinal:  Negative for abdominal pain.  Neurological:  Positive for speech difficulty and weakness.   Physical Exam Updated Vital Signs BP 136/61 (BP Location: Right Arm)    Pulse (!) 43    Temp 98.7 F (37.1 C) (Oral)    Resp 18    Ht 6' (1.829 m)    Wt 96.2 kg    SpO2 99%    BMI 28.75 kg/m  Physical Exam Vitals and nursing note reviewed.  HENT:     Head: Atraumatic.     Mouth/Throat:     Comments: Tongue somewhat thick and potentially enlarged.  No definite edema seen. Musculoskeletal:        General: No tenderness.  Skin:    General: Skin is warm.     Capillary Refill: Capillary refill takes less than 2 seconds.  Neurological:     Mental Status: He is alert and oriented to person, place, and time.     Comments: Mild dysarthria but face symmetric.  Eye movements intact.  Equal smile.  Good blink.  Equal strength bilaterally in lower extremities.    ED Results / Procedures / Treatments   Labs (all labs ordered are listed, but only abnormal results are displayed) Labs Reviewed  RESP PANEL BY RT-PCR (FLU A&B, COVID) ARPGX2  PROTIME-INR  APTT  CBC  DIFFERENTIAL  COMPREHENSIVE METABOLIC PANEL  RAPID URINE DRUG SCREEN, HOSP PERFORMED  URINALYSIS, ROUTINE W REFLEX MICROSCOPIC  ETHANOL  I-STAT CHEM 8, ED    EKG EKG Interpretation  Date/Time:  Sunday June 08 2021 13:14:49 EDT Ventricular Rate:  50 PR Interval:  179 QRS Duration: 117 QT Interval:  503 QTC Calculation: 459 R Axis:   54 Text Interpretation: Sinus rhythm Premature atrial complexes Nonspecific intraventricular conduction delay Minimal ST depression, diffuse leads Confirmed by Davonna Belling [phone]) on 06/08/2021 1:18:31 PM  Radiology MR BRAIN W WO CONTRAST  Result Date: 06/08/2021 CLINICAL DATA:  Tongue paralysis/weakness (CN  12).  Dysarthria. EXAM: MRI HEAD WITHOUT AND WITH CONTRAST TECHNIQUE: Multiplanar, multiecho pulse sequences of the brain and surrounding structures were obtained without and with intravenous contrast. CONTRAST:  9.35m GADAVIST GADOBUTROL 1 MMOL/ML IV SOLN COMPARISON:  Head CT 02/27/2005 FINDINGS: Brain: A 7 mm acute infarct is present in the left paramedian pons. A separate, smaller chronic infarct is noted more laterally in the pons on the left. T2 hyperintensities in the cerebral white matter bilaterally are nonspecific but compatible with minimal chronic small vessel ischemic disease, not advanced for age. No intracranial hemorrhage, mass, midline shift, or extra-axial fluid collection is identified. There is no abnormal intracranial enhancement. There is mild generalized cerebral atrophy which is within normal limits for age. Vascular: Major intracranial vascular flow voids are preserved. Skull and upper cervical spine: Unremarkable bone marrow signal. Sinuses/Orbits: Bilateral  cataract extraction. Clear paranasal sinuses. Trace right mastoid fluid. Other: None. IMPRESSION: Small acute and chronic pontine infarcts. Electronically Signed   By: Logan Bores M.D.   On: 06/08/2021 18:38    Procedures Procedures    Medications Ordered in ED Medications  gadobutrol (GADAVIST) 1 MMOL/ML injection 9.6 mL (9.6 mLs Intravenous Contrast Given 06/08/21 1807)    ED Course/ Medical Decision Making/ A&P                           Medical Decision Making Amount and/or Complexity of Data Reviewed Radiology: ordered.  Risk Prescription drug management.   Patient presents with difficulty speaking.  Began last night.  States just trouble speaking.  Feels of his tongue is thick.  No numbness weakness.  No headache.  No confusion.  Has a slow heart rate but states he has had this for years.  Not feeling lightheadedness or dizziness.  Initial differential diagnosis includes primary tongue issue such as angioedema,  also potentially a stroke.  Not TNK candidate since time of onset was last night and only the dysarthria is a symptom.  Had curbside discussion with neurology and with only dysarthria as the symptom and potentially even physically thickened tongue MRI done with and without contrast.  Unfortunately I did return with acute pons infarct.  Neurology will consult.  Will require admission for further work-up.  Will discuss with hospitalist.  I have independently interpreted the MRI and it does show the pons infarct.          Final Clinical Impression(s) / ED Diagnoses Final diagnoses:  Cerebrovascular accident (CVA), unspecified mechanism [facility])  Dysarthria    Rx / DC Orders ED Discharge Orders     None         Davonna Belling, MD 06/08/21 1859

## 2021-06-08 NOTE — ED Triage Notes (Signed)
Pt to er room number 6 via ems, per ems pt's last known well was yesterday at 27 and is in there er because he is having some tongue numbness and difficulty speaking.  Pt has indwelling 20 gauge iv in his L arm.  States that pt noticed yesterday his speech was garbled when he was talking to his son.  Pt awake and oriented, pt denies weakness.  ?

## 2021-06-08 NOTE — ED Notes (Signed)
Pt in bed, pt reports decreased headache  ?

## 2021-06-08 NOTE — Assessment & Plan Note (Signed)
-  Chronic and asymptomatic ?

## 2021-06-08 NOTE — Assessment & Plan Note (Addendum)
Pontine infarct ?Hx of remote paroxysmal atrial fibrillation ?-Presented with symptoms of paresthesia of the tongue with dysarthria and found to have a small and chronic pontine infarct seen on MRI brain  ?-Patient has remote history of atrial fibrillation back in 1998 but has not been on anticoagulation.  He underwent 2 week cardiac monitoring recently in January 2023 which showed avg HR of 52 with 1 run of ventricular tachycardia lasting 5 beats and 59 supraventricular tachycardia runs. [place] cardiology fellow Dr. Humphrey Rolls that atrial fibrillation is distinguished from SVT on monitoring which pt has no evidence of at this time. Will need to consider more long term monitoring in the setting of new diagnosed infarct.  ?-start aspirin '81mg'$  and plavix '75mg'$  daily for 3 weeks per neurology ?-obtain CTA head and neck  ?-obtain echocardiogram ?-SLP ?-Permissive HTN for first 24 hours <220/110 ?-neurology to follow ?

## 2021-06-08 NOTE — H&P (Signed)
History and Physical    Patient: Vernon Bishop IZT:[id number] DOB: [birth year] DOA: 06/08/2021 DOS: the patient was seen and examined on 06/09/2021 PCP: Cassandria Anger, MD  Patient coming from: Home  Chief Complaint:  Chief Complaint  Patient presents with   Weakness   HPI: Vernon Bishop is a 81 y.o. male with medical history significant of atrial fibrillation not on anticoagulation, HTN, pulmonary hypertension, bradycardia, OSA on CPAP who presents with concerns of speech difficulty and numbness and tingling of the tongue.  Yesterday around 5 PM he noticed numbness sensation to the middle of his tongue causing speech difficulty. No dysphagia. He did not noticed any facial drooping so he did not think he had a stroke so he did not think more about it.  However, this morning his symptoms persisted and he also noticed about 10 minutes of blurry vision and right-sided headache.  No focal extremity weakness.  He decided to call his doctor who recommended presenting to the ED.   In the ED, initially thought this could be angioedema related but he is not on ACE-I and has no new medications. Neurology consulted and recommended MRI Brain wo contrast which resulted with small and chronic pontine infarcts.   CBC and BMP otherwise unremarkable. No flu/COVID pCR. UDS negative.   Hospitalist then called for further management.   Review of Systems: As mentioned in the history of present illness. All other systems reviewed and are negative. Past Medical History:  Diagnosis Date   Arthralgia    Atrial fibrillation ([place])    1992   Benign prostatic hyperplasia    Bradycardia    "my doctor says I have bradycardia, my heart rate is always in the 40s"   DDD (degenerative disc disease)    Depression    Diabetes mellitus    type II  - PT STATES NOT ON ANY DIABETIC MEDICINES   Dysrhythmia    aFIB   Dyssomnia    ED (erectile dysfunction)    Hyperlipidemia    Hypertension    Hypogonadism male     Joint pain    LBP (low back pain)    Multiple actinic keratoses    Muscle weakness    Upper limb   MVP (mitral valve prolapse)    Neck pain    OSA on CPAP    cpap   Osteoarthritis of hip    Osteoarthritis, hand    SEVERE LOWER BACK PAIN, AND RESTLESS LEG SYNDROME   Otalgia of right ear    Pain in left knee    Pain in thoracic spine    Palpitations    PTSD (post-traumatic stress disorder)    Shoulder pain    Subjective visual disturbance    Swallowing problem    HX OF PILLS GETTING "STUCK" IN THROAT AT TIMES   Swelling    Type 2 diabetes mellitus ([place])    pt states he has never had it   Vitamin D deficiency    Past Surgical History:  Procedure Laterality Date   ABDOMINAL EXPOSURE N/A 03/12/2021   Procedure: ABDOMINAL EXPOSURE;  Surgeon: Marty Heck, MD;  Location: [facility];  Service: Vascular;  Laterality: N/A;   ANTERIOR LAT LUMBAR FUSION N/A 03/12/2021   Procedure: ANTERIOR LATERAL LUMBAR FUSION 2 LEVELS;  Surgeon: Melina Schools, MD;  Location: [facility];  Service: Orthopedics;  Laterality: N/A;   APPENDECTOMY     BACK SURGERY  1987   CATARACT EXTRACTION     CERVICAL LAMINECTOMY  2004   [name], rod was placed- HAS ROM LIMITATIONS   CHOLECYSTECTOMY     COLONOSCOPY     JOINT REPLACEMENT     KNEE SURGERY     left knee/arthroscopic   TOTAL HIP ARTHROPLASTY Right 03/14/2013   Procedure: RIGHT TOTAL HIP ARTHROPLASTY ANTERIOR APPROACH;  Surgeon: [name], MD;  Location[facility];  Service: Orthopedics;  Laterality: Right;   TOTAL HIP ARTHROPLASTY Left 05/23/2019   Procedure: TOTAL HIP ARTHROPLASTY ANTERIOR APPROACH;  Surgeon: Paralee Cancel, MD;  Location: [facility];  Service: Orthopedics;  Laterality: Left;  70 mins   Social History:  reports that he quit smoking about 50 years ago. His smoking use included cigarettes. He has a 6.00 pack-year smoking history. He has never used smokeless tobacco. He reports current alcohol use of about 2.0 standard drinks per week. He  reports that he does not use drugs.  Allergies  Allergen Reactions   Abilify [Aripiprazole] Shortness Of Breath   Cymbalta [Duloxetine Hcl] Other (See Comments)    Constipation    Digoxin And Related Other (See Comments)    HR 41 (patient is unsure about this)   Prednisolone Swelling and Other (See Comments)    Edema, weight gain   Temazepam Other (See Comments)    "Made me feel crazy"   Other Rash and Other (See Comments)    Zomax/Zomepirac- Welts, also    Family History  Problem Relation Age of Onset   Allergies Mother    Stroke Mother        Clotting disorders   Heart disease Mother    Heart disease Father    Heart attack Father 73   Heart disease Brother    Rheum arthritis Paternal Grandmother    Other Paternal Grandmother        Rheumatism   Coronary artery disease Other    Diabetes Other    Hypertension Other    Esophageal cancer Maternal Aunt    Colon cancer Neg Hx    Rectal cancer Neg Hx    Stomach cancer Neg Hx     Prior to Admission medications   Medication Sig Start Date End Date Taking? Authorizing Provider  acetaminophen (TYLENOL) 325 MG tablet Take 325-650 mg by mouth every 6 (six) hours as needed for mild pain or headache.   Yes [provider]  amLODipine (NORVASC) 5 MG tablet Take 1 tablet (5 mg total) by mouth daily. 08/30/20 08/30/21 Yes [name], [name], MD  Cholecalciferol (VITAMIN D3 PO) Take 1 tablet by mouth daily.   Yes [provider]  clotrimazole-betamethasone (LOTRISONE) cream Apply 1 application. topically 2 (two) times daily. Patient taking differently: Apply 1 application. topically daily as needed (to affected areas of the face). 06/03/21  Yes [name], [name], MD  Cyanocobalamin (VITAMIN B-12 CR) 1000 MCG TBCR Take 1,000 mcg by mouth daily.     Yes [provider]  FLUoxetine (PROZAC) 40 MG capsule TAKE 1 CAPSULE BY MOUTH DAILY. Patient taking differently: Take 40 mg by mouth in the morning. 04/19/21  Yes  [name], [name], MD  furosemide (LASIX) 20 MG tablet TAKE 1 TABLET (20 MG TOTAL) BY MOUTH DAILY. Patient taking differently: Take 20 mg by mouth daily as needed for edema. 05/19/21  Yes [name], [name], MD  gabapentin (NEURONTIN) 300 MG capsule TAKE 1 CAPSULE BY MOUTH THREE TIMES A DAY Patient taking differently: Take 300 mg by mouth 3 (three) times daily. 11/01/20  Yes [name], [name], MD  PRESCRIPTION  MEDICATION CPAP- At bedtime   Yes [provider]  propranolol (INDERAL) 80 MG tablet TAKE 1 TABLET BY MOUTH 3 TIMES DAILY. Patient taking differently: Take 80 mg by mouth 3 (three) times daily. 12/12/20  Yes [name], [name], MD  sodium chloride (OCEAN) 0.65 % SOLN nasal spray Place 1 spray into both nostrils as needed for congestion.   Yes [provider]  tetrahydrozoline 0.05 % ophthalmic solution Place 1 drop into both eyes 2 (two) times daily as needed (for redness).   Yes [provider]  tiZANidine (ZANAFLEX) 4 MG tablet TAKE 1 TABLET BY MOUTH EVERY 8 HOURS AS NEEDED FOR MUSCLE SPASMS. Patient taking differently: Take 4 mg by mouth every 8 (eight) hours as needed for muscle spasms. 04/19/21  Yes [name], [name], MD  traZODone (DESYREL) 50 MG tablet TAKE 1 TO 2 TABLETS BY MOUTH AT BEDTIME. Patient taking differently: Take 100 mg by mouth at bedtime. 02/26/21  Yes [name], [name], MD  zinc gluconate 50 MG tablet Take 50 mg by mouth daily.   Yes [provider]  ondansetron (ZOFRAN) 4 MG tablet Take 1 tablet (4 mg total) by mouth every 8 (eight) hours as needed for nausea or vomiting. Patient not taking: Reported on 06/08/2021 03/13/21   Melina Schools, MD  polyethylene glycol (MIRALAX / GLYCOLAX) 17 g packet Take 17 g by mouth 2 (two) times daily. Patient not taking: Reported on 06/08/2021 05/24/19   Danae Orleans, PA-C  testosterone cypionate (DEPOTESTOSTERONE CYPIONATE) 200 MG/ML injection INJECT 3 MILLILITERS INTO THE MUSCLE EVERY  14 DAYS Patient not taking: Reported on 06/08/2021 10/14/20   Cassandria Anger, MD    Physical Exam: Vitals:   06/08/21 1851 06/08/21 2155 06/08/21 2355 06/09/21 0155  BP: 136/61 134/61 (!) 164/74 (!) 127/46  Pulse: (!) 43 (!) 43 (!) 45 (!) 45  Resp: '18 15 15 15  '$ Temp: 98.7 F (37.1 C) 98.1 F (36.7 C) 98 F (36.7 C) 97.9 F (36.6 C)  TempSrc: Oral Oral Oral Oral  SpO2: 99% 99% 98% 98%  Weight:      Height:       Constitutional: NAD, calm, comfortable, elderly male sitting upright in bed Eyes:lids and conjunctivae normal ENMT: Mucous membranes are moist. Tongue appears thicken although likely normal anatomic variant.  No lesions, erythema noted to the tongue. Neck: normal, supple,  Respiratory: clear to auscultation bilaterally, no wheezing, no crackles. Normal respiratory effort. No accessory muscle use.  Cardiovascular: Regular rate and rhythm, no murmurs / rubs / gallops. No extremity edema. Abdomen: no tenderness,  Bowel sounds positive.  Musculoskeletal: no clubbing / cyanosis. No joint deformity upper and lower extremities. Good ROM, no contractures. Normal muscle tone.  Skin: no rashes, lesions, ulcers.  Neurologic: CN 2-12 grossly intact.  Facial asymmetry, symmetric smile.  Slight difficulty with speech but no trouble word finding.  Strength 5/5 in all 4.  Psychiatric: Normal judgment and insight. Alert and oriented x 3. Normal mood. Data Reviewed:  See HPI  Assessment and Plan: * Stroke ([place]) Pontine infarct Hx of remote paroxysmal atrial fibrillation -Presented with symptoms of paresthesia of the tongue with dysarthria and found to have a small and chronic pontine infarct seen on MRI brain  -Patient has remote history of atrial fibrillation back in 1998 but has not been on anticoagulation.  He underwent 2 week cardiac monitoring recently in January 2023 which showed avg HR of 52 with 1 run of ventricular tachycardia lasting 5 beats and 59  supraventricular  tachycardia runs. [facility] cardiology fellow Dr. Humphrey Rolls that atrial fibrillation is distinguished from SVT on monitoring which pt has no evidence of at this time. Will need to consider more long term monitoring in the setting of new diagnosed infarct.  -start aspirin '81mg'$  and plavix '75mg'$  daily for 3 weeks per neurology -obtain CTA head and neck  -obtain echocardiogram -SLP -Permissive HTN for first 24 hours <220/110 -neurology to follow  Sinus bradycardia -Chronic and asymptomatic  OSA on CPAP Continue CPAP      Advance Care Planning:   Code Status: Full Code   Consults: Neurology  Family Communication: Discussed with wife at bedside  Severity of Illness: The appropriate patient status for this patient is OBSERVATION. Observation status is judged to be reasonable and necessary in order to provide the required intensity of service to ensure the patient's safety. The patient's presenting symptoms, physical exam findings, and initial radiographic and laboratory data in the context of their medical condition is felt to place them at decreased risk for further clinical deterioration. Furthermore, it is anticipated that the patient will be medically stable for discharge from the hospital within 2 midnights of admission.   Author: Orene Desanctis, DO 06/09/2021 4:16 AM  For on call review [email or web address].

## 2021-06-08 NOTE — ED Notes (Signed)
Pt back from MRI, pt states that he had another headache when he was in MRI, but it has gone away at this time,  pt awake and answering questions appropriately.  No weakness noted, pt states that his speech may have improved. Md at bedside reviewing results.    ?

## 2021-06-08 NOTE — ED Notes (Signed)
Pt reports 6/10 back pain, states that he doesn't need anything for pain at this time.  ?

## 2021-06-08 NOTE — ED Notes (Signed)
Pt in MRI.

## 2021-06-08 NOTE — ED Notes (Signed)
Pt in bed, pt reports a sudden onset of a pain/headache, states that the pain is going away now, pt remains oriented, no facial droop, continues to have the slurred speech. Md notified  ?

## 2021-06-09 ENCOUNTER — Other Ambulatory Visit (HOSPITAL_COMMUNITY): Payer: Self-pay

## 2021-06-09 ENCOUNTER — Telehealth: Payer: Self-pay | Admitting: Internal Medicine

## 2021-06-09 ENCOUNTER — Observation Stay (HOSPITAL_BASED_OUTPATIENT_CLINIC_OR_DEPARTMENT_OTHER): Payer: PPO

## 2021-06-09 DIAGNOSIS — R471 Dysarthria and anarthria: Secondary | ICD-10-CM | POA: Diagnosis not present

## 2021-06-09 DIAGNOSIS — I6389 Other cerebral infarction: Secondary | ICD-10-CM

## 2021-06-09 DIAGNOSIS — I639 Cerebral infarction, unspecified: Secondary | ICD-10-CM | POA: Diagnosis not present

## 2021-06-09 DIAGNOSIS — R479 Unspecified speech disturbances: Secondary | ICD-10-CM

## 2021-06-09 DIAGNOSIS — I6340 Cerebral infarction due to embolism of unspecified cerebral artery: Secondary | ICD-10-CM | POA: Diagnosis not present

## 2021-06-09 LAB — HEMOGLOBIN A1C
Hgb A1c MFr Bld: 5 % (ref 4.8–5.6)
Mean Plasma Glucose: 97 mg/dL

## 2021-06-09 LAB — ECHOCARDIOGRAM COMPLETE BUBBLE STUDY
AR max vel: 2.25 cm2
AV Area VTI: 2.27 cm2
AV Area mean vel: 2.13 cm2
AV Mean grad: 5 mmHg
AV Peak grad: 10.5 mmHg
Ao pk vel: 1.62 m/s
Area-P 1/2: 3.53 cm2
Calc EF: 58.4 %
S' Lateral: 3.2 cm
Single Plane A2C EF: 59.9 %
Single Plane A4C EF: 55.3 %

## 2021-06-09 LAB — LIPID PANEL
Cholesterol: 157 mg/dL (ref 0–200)
HDL: 33 mg/dL — ABNORMAL LOW (ref >40)
LDL Cholesterol: 104 mg/dL — ABNORMAL HIGH (ref 0–99)
Total CHOL/HDL Ratio: 4.8 RATIO
Triglycerides: 99 mg/dL (ref <150)
VLDL: 20 mg/dL (ref 0–40)

## 2021-06-09 MED ORDER — APIXABAN 5 MG PO TABS: 5.0000 mg | ORAL_TABLET | Freq: Two times a day (BID) | ORAL | 1 refills | Status: DC

## 2021-06-09 MED ORDER — TRAZODONE HCL 50 MG PO TABS: 100.0000 mg | ORAL_TABLET | Freq: Every day | ORAL | Status: DC

## 2021-06-09 MED ORDER — APIXABAN 5 MG PO TABS: 5.0000 mg | ORAL_TABLET | Freq: Two times a day (BID) | ORAL | Status: DC

## 2021-06-09 MED ORDER — ZINC SULFATE 220 (50 ZN) MG PO CAPS
220.0000 mg | ORAL_CAPSULE | Freq: Every day | ORAL | Status: DC
  Administered 2021-06-09: 220 mg via ORAL
  Filled 2021-06-09: qty 1

## 2021-06-09 MED ORDER — TIZANIDINE HCL 4 MG PO TABS
4.0000 mg | ORAL_TABLET | Freq: Three times a day (TID) | ORAL | Status: DC | PRN
  Administered 2021-06-09: 4 mg via ORAL
  Filled 2021-06-09: qty 1

## 2021-06-09 MED ORDER — FLUOXETINE HCL 20 MG PO CAPS
40.0000 mg | ORAL_CAPSULE | Freq: Every day | ORAL | Status: DC
  Administered 2021-06-09: 40 mg via ORAL
  Filled 2021-06-09: qty 2

## 2021-06-09 MED ORDER — ATORVASTATIN CALCIUM 40 MG PO TABS
40.0000 mg | ORAL_TABLET | Freq: Every day | ORAL | Status: DC
  Administered 2021-06-09: 40 mg via ORAL
  Filled 2021-06-09: qty 1

## 2021-06-09 MED ORDER — VITAMIN D 25 MCG (1000 UNIT) PO TABS
1000.0000 [IU] | ORAL_TABLET | Freq: Every day | ORAL | Status: DC
  Administered 2021-06-09: 1000 [IU] via ORAL
  Filled 2021-06-09: qty 1

## 2021-06-09 MED ORDER — CLOPIDOGREL BISULFATE 75 MG PO TABS
75.0000 mg | ORAL_TABLET | Freq: Every day | ORAL | Status: DC
  Administered 2021-06-09: 75 mg via ORAL
  Filled 2021-06-09: qty 1

## 2021-06-09 MED ORDER — GABAPENTIN 300 MG PO CAPS
300.0000 mg | ORAL_CAPSULE | Freq: Three times a day (TID) | ORAL | Status: DC
  Administered 2021-06-09 (×2): 300 mg via ORAL
  Filled 2021-06-09 (×2): qty 1

## 2021-06-09 MED ORDER — ATORVASTATIN CALCIUM 40 MG PO TABS: 40.0000 mg | ORAL_TABLET | Freq: Every day | ORAL | 1 refills | Status: DC

## 2021-06-09 MED ORDER — VITAMIN B-12 1000 MCG PO TABS
1000.0000 ug | ORAL_TABLET | Freq: Every day | ORAL | Status: DC
  Administered 2021-06-09: 1000 ug via ORAL
  Filled 2021-06-09: qty 1

## 2021-06-09 NOTE — Evaluation (Signed)
Speech Language Pathology Evaluation ?Patient Details ?Name: Vernon Bishop  ?MRN: [id number] ?DOB: 15-Jan-1941 ?Today's Date: 06/09/2021 ?Time: 9702-6378 ?SLP Time Calculation (min) (ACUTE ONLY): 14 min ? ?Problem List:  ?Patient Active Problem List  ? Diagnosis Date Noted  ? Stroke [facility]) 06/08/2021  ? HTN (hypertension) 06/08/2021  ? Paroxysmal atrial fibrillation ([place]) 06/08/2021  ? Postoperative anemia 06/03/2021  ? S/P lumbar fusion 03/12/2021  ? History of atrial fibrillation 01/22/2021  ? Pulmonary hypertension, unspecified ([place]) 01/20/2021  ? Chronic back pain 01/07/2021  ? S/P hip replacement, left 05/23/2019  ? Ear pain, right 04/05/2019  ? Leg weakness, bilateral 02/01/2019  ? Arm weakness 04/07/2018  ? BPH (benign prostatic hyperplasia) 04/23/2017  ? Hip pain, chronic, left 05/06/2016  ? Delusional thoughts ([place]) 05/06/2016  ? CTS (carpal tunnel syndrome) 07/29/2015  ? Ulnar neuropathy 07/29/2015  ? Generalized anxiety disorder 07/29/2015  ? Pre-op evaluation 01/28/2015  ? Insomnia 04/13/2014  ? Left knee pain 10/10/2013  ? Delusional disorder ([place]) 10/10/2013  ? Overweight (BMI 25.0-29.9) 03/15/2013  ? S/P total hip arthroplasty 03/14/2013  ? Hip osteoarthritis 03/06/2013  ? Bradycardia 10/09/2012  ? Sinusitis, acute 06/29/2012  ? Actinic keratoses 05/24/2012  ? Hyponatremia 10/23/2011  ? Edema 10/23/2011  ? Shoulder pain, acute 07/13/2011  ? Arthralgia 06/08/2011  ? Cervical pain 01/28/2011  ? Neoplasm of uncertain behavior of skin 06/16/2010  ? Sinus bradycardia 03/17/2010  ? SINUSITIS, ACUTE 12/11/2009  ? PAIN IN THORACIC SPINE 04/26/2009  ? TOBACCO USE, QUIT 02/19/2009  ? OSA on CPAP 11/15/2008  ? Hypogonadism male 05/02/2008  ? Vitamin B12 deficiency 08/26/2007  ? VISUAL CHANGES 08/25/2007  ? OTHER CONVULSIONS 08/25/2007  ? SLEEP DEPRIVATION 08/25/2007  ? Palpitations 08/25/2007  ? HIP PAIN 04/29/2007  ? Osteoarthritis 02/15/2007  ? Dyslipidemia 02/14/2007  ? Depression 02/14/2007  ? Atrial fibrillation  ([place]) 02/14/2007  ? Seborrheic dermatitis 02/14/2007  ? Diabetes mellitus type 2, diet-controlled ([place]) 01/11/2007  ? Essential hypertension 01/11/2007  ? LOW BACK PAIN 01/11/2007  ? ?Past Medical History:  ?Past Medical History:  ?Diagnosis Date  ? Arthralgia   ? Atrial fibrillation ([place])   ? 1992  ? Benign prostatic hyperplasia   ? Bradycardia   ? "my doctor says I have bradycardia, my heart rate is always in the 40s"  ? DDD (degenerative disc disease)   ? Depression   ? Diabetes mellitus   ? type II  - PT STATES NOT ON ANY DIABETIC MEDICINES  ? Dysrhythmia   ? aFIB  ? Dyssomnia   ? ED (erectile dysfunction)   ? Hyperlipidemia   ? Hypertension   ? Hypogonadism male   ? Joint pain   ? LBP (low back pain)   ? Multiple actinic keratoses   ? Muscle weakness   ? Upper limb  ? MVP (mitral valve prolapse)   ? Neck pain   ? OSA on CPAP   ? cpap  ? Osteoarthritis of hip   ? Osteoarthritis, hand   ? SEVERE LOWER BACK PAIN, AND RESTLESS LEG SYNDROME  ? Otalgia of right ear   ? Pain in left knee   ? Pain in thoracic spine   ? Palpitations   ? PTSD (post-traumatic stress disorder)   ? Shoulder pain   ? Subjective visual disturbance   ? Swallowing problem   ? HX OF PILLS GETTING "STUCK" IN THROAT AT TIMES  ? Swelling   ? Type 2 diabetes mellitus ([place])   ? pt states he has never  had it  ? Vitamin D deficiency   ? ?Past Surgical History:  ?Past Surgical History:  ?Procedure Laterality Date  ? ABDOMINAL EXPOSURE N/A 03/12/2021  ? Procedure: ABDOMINAL EXPOSURE;  Surgeon: Marty Heck, MD;  Location: [facility];  Service: Vascular;  Laterality: N/A;  ? ANTERIOR LAT LUMBAR FUSION N/A 03/12/2021  ? Procedure: ANTERIOR LATERAL LUMBAR FUSION 2 LEVELS;  Surgeon: Melina Schools, MD;  Location: [facility];  Service: Orthopedics;  Laterality: N/A;  ? APPENDECTOMY    ? BACK SURGERY  1987  ? CATARACT EXTRACTION    ? CERVICAL LAMINECTOMY  2004  ? [name], rod was placed- HAS ROM LIMITATIONS  ? CHOLECYSTECTOMY    ? COLONOSCOPY    ? JOINT  REPLACEMENT    ? KNEE SURGERY    ? left knee/arthroscopic  ? TOTAL HIP ARTHROPLASTY Right 03/14/2013  ? Procedure: RIGHT TOTAL HIP ARTHROPLASTY ANTERIOR APPROACH;  Surgeon: [name], MD;  [facility];  Service: Orthopedics;  Laterality: Right;  ? TOTAL HIP ARTHROPLASTY Left 05/23/2019  ? Procedure: TOTAL HIP ARTHROPLASTY ANTERIOR APPROACH;  Surgeon: Paralee Cancel, MD;  [facility];  Service: Orthopedics;  Laterality: Left;  70 mins  ? ?HPI:  ?Pt is a 81 y/o male presenting on 3/12 with weakness, speech difficulty. MRI with 7 mm acute infarct in L paramedian pons, as well as chronic infarct in L pons (more laterally).  PMH includes: afib, HTN, pulmonary HTN, bradycardia, OSA on CPAP.  ? ?Assessment / Plan / Recommendation ?Clinical Impression ? Pt scored 25/30 on the SLUMS, primarily losing points on the delayed recall subtest. Prior to completing this section, pt indicated that he has always had trouble with these kinds of tests, and that his memory has not been good for a while PTA. Although this score falls into the category of mild impairment, pt may be at his cognitive baseline and he denies anya cute changes. He does however acknowledge persistent changes to his speech. He is intelligible to this SLP, but he reports that it is only with certain words that he has noticeable difficult in articulation. Recommend considering OP SLP f/u  upon discharge to maximize speech. Could also consider completion of higher level cognititve testing, although pt does say that he will have support from his wife upon discharge home. ?   ?SLP Assessment ? SLP Recommendation/Assessment: All further Speech Lanaguage Pathology  needs can be addressed in the next venue of care ?SLP Visit Diagnosis: Dysarthria and anarthria (R47.1)  ?  ?Recommendations for follow up therapy are one component of a multi-disciplinary discharge planning process, led by the attending physician.  Recommendations may be updated based on  patient status, additional functional criteria and insurance authorization. ?   ?Follow Up Recommendations ? Outpatient SLP  ?  ?Assistance Recommended at Discharge ? Intermittent Supervision/Assistance  ?Functional Status Assessment Patient has had a recent decline in their functional status and demonstrates the ability to make significant improvements in function in a reasonable and predictable amount of time.  ?Frequency and Duration    ?  ?  ?   ?SLP Evaluation ?Cognition ? Overall Cognitive Status: History of cognitive impairments - at baseline  ?  ?   ?Comprehension ? Auditory Comprehension ?Overall Auditory Comprehension: Appears within functional limits for tasks assessed  ?  ?Expression Expression ?Primary Mode of Expression: Verbal ?Verbal Expression ?Overall Verbal Expression: Appears within functional limits for tasks assessed ?Written Expression ?Dominant Hand: Right   ?Oral / Motor ? Motor Speech ?Overall  Motor Speech: Impaired ?Respiration: Within functional limits ?Phonation: Normal ?Resonance: Within functional limits ?Articulation: Impaired ?Level of Impairment: Conversation ?Intelligibility: Intelligible ?Motor Planning: Witnin functional limits   ?        ? ?Osie Bond., M.A. CCC-SLP ?Acute Rehabilitation Services ?Pager [phone] ?Office [phone] ? ?06/09/2021, 12:02 PM ? ?

## 2021-06-09 NOTE — TOC Benefit Eligibility Note (Signed)
Patient Advocate Encounter  Insurance verification completed.    The patient is currently admitted and upon discharge could be taking Eliquis 5 mg.  The current 30 day co-pay is, $45.00.   The patient is insured through [employer] Part D     Kalle Bernath, CPhT Pharmacy Patient Advocate Specialist [facility] Patient Advocate Team Direct Number: [phone]  Fax: [phone]        

## 2021-06-09 NOTE — Consult Note (Signed)
Neurology Consult H&P  Vernon Bishop MR# [id number] 06/09/2021   CC: Pontine stroke  History is obtained from: Wife patient and chart.  HPI: Vernon Bishop is a 81 y.o. male PMHx as reviewed below remote history of atrial fibrillation however which they state resolved and was never on anticoagulation.  He was recently seen by cardiologist and had a Holter monitor which may have showed atrial fibrillation/arrhythmia however he has not followed up with cardiology for initiation of anticoagulation.   The following information was taken from hospitalist note 06/08/2021:    "...atrial fibrillation not on anticoagulation, HTN, pulmonary hypertension, bradycardia, OSA on CPAP who presents with concerns of speech difficulty and numbness and tingling of the tongue.   Yesterday around 5 PM he noticed numbness sensation to the middle of his tongue causing speech difficulty. No dysphagia. He did not noticed any facial drooping so he did not think he had a stroke so he did not think more about it.  However, this morning his symptoms persisted and he also noticed about 10 minutes of blurry vision and right-sided headache.  No focal extremity weakness.  He decided to call his doctor who recommended presenting to the ED.    In the ED, initially thought this could be angioedema related but he is not on ACE-I and has no new medications. Neurology consulted and recommended MRI Brain wo contrast who resulted with small and chronic pontine infarcts."  LKW: 1700 06/09/2021 tNK given: No OS W IR Thrombectomy No, not indicated Modified Rankin Scale: 0-Completely asymptomatic and back to baseline post- stroke NIHSS: 2 LOC Responsiveness 0 LOC Questions 0 LOC Commands 0 Horizontal eye movement 0 Visual field 0 Facial palsy 0 Motor arm - Right arm 0 Motor arm - Left arm 0 Motor leg - Right leg 0 Motor leg - Left leg 0 Limb ataxia 0 Sensory test 1 Language 0 Speech 1 Extinction and inattention  0   ROS: A complete ROS was performed and is negative except as noted in the HPI.   Past Medical History:  Diagnosis Date   Arthralgia    Atrial fibrillation ([place])    1992   Benign prostatic hyperplasia    Bradycardia    "my doctor says I have bradycardia, my heart rate is always in the 40s"   DDD (degenerative disc disease)    Depression    Diabetes mellitus    type II  - PT STATES NOT ON ANY DIABETIC MEDICINES   Dysrhythmia    aFIB   Dyssomnia    ED (erectile dysfunction)    Hyperlipidemia    Hypertension    Hypogonadism male    Joint pain    LBP (low back pain)    Multiple actinic keratoses    Muscle weakness    Upper limb   MVP (mitral valve prolapse)    Neck pain    OSA on CPAP    cpap   Osteoarthritis of hip    Osteoarthritis, hand    SEVERE LOWER BACK PAIN, AND RESTLESS LEG SYNDROME   Otalgia of right ear    Pain in left knee    Pain in thoracic spine    Palpitations    PTSD (post-traumatic stress disorder)    Shoulder pain    Subjective visual disturbance    Swallowing problem    HX OF PILLS GETTING "STUCK" IN THROAT AT TIMES   Swelling    Type 2 diabetes mellitus ([place])    pt states he has  never had it   Vitamin D deficiency      Family History  Problem Relation Age of Onset   Allergies Mother    Stroke Mother        Clotting disorders   Heart disease Mother    Heart disease Father    Heart attack Father 53   Heart disease Brother    Rheum arthritis Paternal Grandmother    Other Paternal Grandmother        Rheumatism   Coronary artery disease Other    Diabetes Other    Hypertension Other    Esophageal cancer Maternal Aunt    Colon cancer Neg Hx    Rectal cancer Neg Hx    Stomach cancer Neg Hx     Social History:  reports that he quit smoking about 50 years ago. His smoking use included cigarettes. He has a 6.00 pack-year smoking history. He has never used smokeless tobacco. He reports current alcohol use of about 2.0 standard drinks per  week. He reports that he does not use drugs.   Prior to Admission medications   Medication Sig Start Date End Date Taking? Authorizing Provider  acetaminophen (TYLENOL) 325 MG tablet Take 325-650 mg by mouth every 6 (six) hours as needed for mild pain or headache.   Yes [provider]  amLODipine (NORVASC) 5 MG tablet Take 1 tablet (5 mg total) by mouth daily. 08/30/20 08/30/21 Yes [name], [name], MD  Cholecalciferol (VITAMIN D3 PO) Take 1 tablet by mouth daily.   Yes [provider]  clotrimazole-betamethasone (LOTRISONE) cream Apply 1 application. topically 2 (two) times daily. Patient taking differently: Apply 1 application. topically daily as needed (to affected areas of the face). 06/03/21  Yes [name], [name], MD  Cyanocobalamin (VITAMIN B-12 CR) 1000 MCG TBCR Take 1,000 mcg by mouth daily.     Yes [provider]  FLUoxetine (PROZAC) 40 MG capsule TAKE 1 CAPSULE BY MOUTH DAILY. Patient taking differently: Take 40 mg by mouth in the morning. 04/19/21  Yes [name], [name], MD  furosemide (LASIX) 20 MG tablet TAKE 1 TABLET (20 MG TOTAL) BY MOUTH DAILY. Patient taking differently: Take 20 mg by mouth daily as needed for edema. 05/19/21  Yes [name], [name], MD  gabapentin (NEURONTIN) 300 MG capsule TAKE 1 CAPSULE BY MOUTH THREE TIMES A DAY Patient taking differently: Take 300 mg by mouth 3 (three) times daily. 11/01/20  Yes [name], [name], MD  PRESCRIPTION MEDICATION CPAP- At bedtime   Yes [provider]  propranolol (INDERAL) 80 MG tablet TAKE 1 TABLET BY MOUTH 3 TIMES DAILY. Patient taking differently: Take 80 mg by mouth 3 (three) times daily. 12/12/20  Yes [name], [name], MD  sodium chloride (OCEAN) 0.65 % SOLN nasal spray Place 1 spray into both nostrils as needed for congestion.   Yes [provider]  tetrahydrozoline 0.05 % ophthalmic solution Place 1 drop into both eyes 2 (two) times daily as needed (for redness).    Yes [provider]  tiZANidine (ZANAFLEX) 4 MG tablet TAKE 1 TABLET BY MOUTH EVERY 8 HOURS AS NEEDED FOR MUSCLE SPASMS. Patient taking differently: Take 4 mg by mouth every 8 (eight) hours as needed for muscle spasms. 04/19/21  Yes [name], [name], MD  traZODone (DESYREL) 50 MG tablet TAKE 1 TO 2 TABLETS BY MOUTH AT BEDTIME. Patient taking differently: Take 100 mg by mouth at bedtime. 02/26/21  Yes [name], [name], MD  zinc gluconate 50 MG tablet  Take 50 mg by mouth daily.   Yes [provider]  ondansetron (ZOFRAN) 4 MG tablet Take 1 tablet (4 mg total) by mouth every 8 (eight) hours as needed for nausea or vomiting. Patient not taking: Reported on 06/08/2021 03/13/21   Melina Schools, MD  polyethylene glycol (MIRALAX / GLYCOLAX) 17 g packet Take 17 g by mouth 2 (two) times daily. Patient not taking: Reported on 06/08/2021 05/24/19   Danae Orleans, PA-C  testosterone cypionate (DEPOTESTOSTERONE CYPIONATE) 200 MG/ML injection INJECT 3 MILLILITERS INTO THE MUSCLE EVERY 14 DAYS Patient not taking: Reported on 06/08/2021 10/14/20   [name], [name], MD    Exam: Current vital signs: BP (!) 134/56 (BP Location: Right Arm)    Pulse (!) 40    Temp 97.8 F (36.6 C) (Oral)    Resp 14    Ht 6' (1.829 m)    Wt 96.2 kg    SpO2 97%    BMI 28.75 kg/m   Physical Exam  Constitutional: Appears well-developed and well-nourished.  Psych: Affect appropriate to situation Eyes: No scleral injection HENT: No OP obstruction. Head: Normocephalic.  Cardiovascular: Normal rate and regular rhythm.  Respiratory: Effort normal, symmetric excursions bilaterally, no audible wheezing. GI: Soft.  No distension. There is no tenderness.  Skin: WDI  Neuro: Mental Status: Patient is awake, alert, oriented to person, place, month, year, and situation. Patient is able to give a clear and coherent history. Speech moderate dysarthria however fluent, intact comprehension and repetition. No  signs of aphasia or neglect. Visual Fields are full. Pupils are equal, round, and reactive to light. EOMI without ptosis or diplopia.  Facial sensation is symmetric to temperature Facial movement is symmetric.  Hearing is intact to voice. Uvula midline and palate elevates symmetrically. Shoulder shrug is symmetric. Tongue is midline without atrophy or fasciculations.  Tone is normal. Bulk is normal. 5/5 strength was present in all four extremities. Sensation states the left side of his tongue is numb. Extremity sensation is symmetric to light touch and temperature in the arms and legs. Deep Tendon Reflexes: 2+ and symmetric in the biceps and patellae. Toes are downgoing bilaterally. FNF and HKS are intact bilaterally. Gait - Deferred  I have reviewed labs in epic and the pertinent results are: LDL 104  I have reviewed the images obtained: MRI brain showed 7 mm acute infarct is present in the left paramedian pons. A separate, smaller chronic infarct is noted more laterally in the pons on the left. CTA head and neck showed Negative CTA for large vessel occlusion or other emergent finding. Moderate atheromatous disease involving the intracranial posterior circulation without correctable stenosis. The left PCA is severely attenuated and only faintly visible, but remains grossly patent to its distal aspect. Predominant fetal type origin of the right PCA.  Assessment: Vernon Bishop is a 81 y.o. male PMHx pontine stroke arrives outside the window for intervention and imaging shows acute and chronic pontine lacunar infarction. He was recently seen by cardiologist and had a Holter monitor which may have showed atrial fibrillation/arrhythmia however he has not followed up with cardiology for initiation of anticoagulation.  He does have OSA however he wears his CPAP (setting of 12) religiously.  He will need to start antiplatelet until the arrhythmia is confirmed.  Impression:  Acute embolic  stroke left paramedian pons Chronic embolic pontine stroke OSA on CPAP-compliant HTN  Plan: - Recommend TTE. - Recommend labs: HbA1c, lipid panel. - Recommend Statin for goal LDL <70. - Goal  A1c <7. - Aspirin '81mg'$  daily. - Clopidogrel '75mg'$  daily for 3 weeks. - SBP goal <160. - Permissive hypertension first 24 h < 220/110.  - Telemetry monitoring for arrhythmia. - Recommend bedside Swallow screen. - Recommend Stroke education. - Recommend PT/OT/SLP consult.   Electronically signed by:  Lynnae Sandhoff, MD Page: [phone] 06/09/2021, 6:48 AM  If 7pm- 7am, please page neurology on call as listed in [place].

## 2021-06-09 NOTE — Progress Notes (Signed)
PT Cancellation Note ? ?Patient Details ?Name: Vernon Bishop ?MRN: [id number] ?DOB: 09/12/1940 ? ? ?Cancelled Treatment:    Reason Eval/Treat Not Completed: Patient at procedure or test/unavailable ? ?Patient with vascular.  ? ? ?Arby Barrette, PT ?Acute Rehabilitation Services  ?Pager [phone] ?Office [phone] ? ? ?Jeanie Cooks Luann Aspinwall ?06/09/2021, 2:41 PM ?

## 2021-06-09 NOTE — Telephone Encounter (Signed)
Connected to [facility] 3.12.2023. ? ?Caller states that her husband feels weird , his ?tongue feels numb and has trouble saying his words and words "didn't sound right to him". Started yesterday, but a little better this morning. BP 151/70. No numbness or weakness- can lift both arms. Can ?swallow okay. ? ?Advised to call EMS 911.  ?

## 2021-06-09 NOTE — TOC Transition Note (Signed)
Transition of Care (TOC) - CM/SW Discharge Note ? ? ?Patient Details  ?Name: Vernon Bishop ?MRN: [id number] ?Date of Birth: 10/23/1940 ? ?Transition of Care (TOC) CM/SW Contact:  ?Cyndi Bender, RN ?Phone Number: ?06/09/2021, 3:08 PM ? ? ?Clinical Narrative:    ?Patient stable for discharge. ?Referral sent to Neuro rehab for SLP therapy ?Eliquis copay card given to patient. ?Wife at bedside to take home ? ?Final next level of care: OP Rehab ?Barriers to Discharge: Barriers Resolved ? ? ?Patient Goals and CMS Choice ?Patient states their goals for this hospitalization and ongoing recovery are:: return home ?  ?  ? ?Discharge Placement ?  ?           ?  ? Home  ?  ?  ? ?Discharge Plan and Services ?  ? Home with SLP therapy  ?          ?  ?  ?  ?  ?  ?  ?  ?  ?  ?  ? ?Social Determinants of Health (SDOH) Interventions ?  ? ? ?Readmission Risk Interventions ?No flowsheet data found. ? ? ? ? ?

## 2021-06-09 NOTE — Progress Notes (Signed)
Admitted with pontine stroke. Pt known to me for last few months. He had remote history of Afib in 1992, but had not been on anticoagulation. He does have left atrial dilatation on echocardiogram so I suspected he may be having paroxysmal Afib. While there was no Afib seen on monitor, that does not exclude possibility of Afib outside of it. Now that he has had a stroke, I would recommend anticoagulation with eliquis 5 mg bid or Xarelto 20 mg daily, when deemed safe to start from neurologic standpoint. I do not think loop recorder is necessary, given that he has had remote but documented h/o Afib before. ? ?Will arrange outpatient f/u ? ? ?Nigel Mormon, MD ?Pager: [phone] ?Office: [phone] ? ?

## 2021-06-09 NOTE — Discharge Summary (Signed)
Physician Discharge Summary  Vernon Bishop KNL:[id number] DOB: 04-23-1940 DOA: 06/08/2021  PCP: Cassandria Anger, MD  Admit date: 06/08/2021 Discharge date: 06/09/2021  Admitted From: Home Disposition:  Home  Recommendations for Outpatient Follow-up:  Follow up with PCP in 1 week Follow up with Cardiology Dr. Virgina Jock in 2 weeks  Follow up with Neurology Dr. Posey Pronto in 4 weeks  Ha1c is pending  Discharge Condition: Stable CODE STATUS: Full  Diet recommendation: Heart healthy   Brief/Interim Summary: From H&P by Dr. Flossie Buffy: "Vernon Bishop is a 81 y.o. male with medical history significant of atrial fibrillation not on anticoagulation, HTN, pulmonary hypertension, bradycardia, OSA on CPAP who presents with concerns of speech difficulty and numbness and tingling of the tongue.   Yesterday around 5 PM he noticed numbness sensation to the middle of his tongue causing speech difficulty. No dysphagia. He did not noticed any facial drooping so he did not think he had a stroke so he did not think more about it.  However, this morning his symptoms persisted and he also noticed about 10 minutes of blurry vision and right-sided headache.  No focal extremity weakness.  He decided to call his doctor who recommended presenting to the ED.    In the ED, initially thought this could be angioedema related but he is not on ACE-I and has no new medications. Neurology consulted and recommended MRI Brain wo contrast which resulted with small acute and chronic pontine infarcts.   CBC and BMP otherwise unremarkable. No flu/COVID pCR. UDS negative.    Hospitalist then called for further management."  Stroke team consulted on patient. After conferring with cardiology on patient's history of a fib, eliquis was started.  Discharge Diagnoses:  Principal Problem:   Stroke [facility]) Active Problems:   Paroxysmal atrial fibrillation (HCC)   OSA on CPAP   Sinus bradycardia   HTN (hypertension)    Discharge  Instructions  Discharge Instructions     Ambulatory referral to Neurology   Complete by: As directed    Follow up with Dr. Posey Pronto at [facility] in 4 weeks. Pt is Dr. Serita Grit pt. Thanks.   Call MD for:  difficulty breathing, headache or visual disturbances   Complete by: As directed    Call MD for:  extreme fatigue   Complete by: As directed    Call MD for:  persistant dizziness or light-headedness   Complete by: As directed    Call MD for:  persistant nausea and vomiting   Complete by: As directed    Call MD for:  severe uncontrolled pain   Complete by: As directed    Call MD for:  temperature >100.4   Complete by: As directed    Diet - low sodium heart healthy   Complete by: As directed    Discharge instructions   Complete by: As directed    You were cared for by a hospitalist during your hospital stay. If you have any questions about your discharge medications or the care you received while you were in the hospital after you are discharged, you can call the unit and ask to speak with the hospitalist on call if the hospitalist that took care of you is not available. Once you are discharged, your primary care physician will handle any further medical issues. Please note that NO REFILLS for any discharge medications will be authorized once you are discharged, as it is imperative that you return to your primary care physician (or establish a relationship with a  primary care physician if you do not have one) for your aftercare needs so that they can reassess your need for medications and monitor your lab values.   Increase activity slowly   Complete by: As directed       Allergies as of 06/09/2021       Reactions   Abilify [aripiprazole] Shortness Of Breath   Cymbalta [duloxetine Hcl] Other (See Comments)   Constipation   Digoxin And Related Other (See Comments)   HR 41 (patient is unsure about this)   Prednisolone Swelling, Other (See Comments)   Edema, weight gain   Temazepam Other (See  Comments)   "Made me feel crazy"   Other Rash, Other (See Comments)   Zomax/Zomepirac- Welts, also        Medication List     STOP taking these medications    ondansetron 4 MG tablet Commonly known as: Zofran   polyethylene glycol 17 g packet Commonly known as: MIRALAX / GLYCOLAX   testosterone cypionate 200 MG/ML injection Commonly known as: DEPOTESTOSTERONE CYPIONATE       TAKE these medications    acetaminophen 325 MG tablet Commonly known as: TYLENOL Take 325-650 mg by mouth every 6 (six) hours as needed for mild pain or headache.   amLODipine 5 MG tablet Commonly known as: NORVASC Take 1 tablet (5 mg total) by mouth daily.   apixaban 5 MG Tabs tablet Commonly known as: ELIQUIS Take 1 tablet (5 mg total) by mouth 2 (two) times daily.   atorvastatin 40 MG tablet Commonly known as: LIPITOR Take 1 tablet (40 mg total) by mouth daily. Start taking on: June 10, 2021   clotrimazole-betamethasone cream Commonly known as: LOTRISONE Apply 1 application. topically 2 (two) times daily. What changed:  when to take this reasons to take this   FLUoxetine 40 MG capsule Commonly known as: PROZAC TAKE 1 CAPSULE BY MOUTH DAILY. What changed: when to take this   furosemide 20 MG tablet Commonly known as: LASIX TAKE 1 TABLET (20 MG TOTAL) BY MOUTH DAILY. What changed:  when to take this reasons to take this   gabapentin 300 MG capsule Commonly known as: NEURONTIN TAKE 1 CAPSULE BY MOUTH THREE TIMES A DAY What changed: See the new instructions.   PRESCRIPTION MEDICATION CPAP- At bedtime   propranolol 80 MG tablet Commonly known as: INDERAL TAKE 1 TABLET BY MOUTH 3 TIMES DAILY.   sodium chloride 0.65 % Soln nasal spray Commonly known as: OCEAN Place 1 spray into both nostrils as needed for congestion.   tetrahydrozoline 0.05 % ophthalmic solution Place 1 drop into both eyes 2 (two) times daily as needed (for redness).   tiZANidine 4 MG tablet Commonly  known as: ZANAFLEX TAKE 1 TABLET BY MOUTH EVERY 8 HOURS AS NEEDED FOR MUSCLE SPASMS.   traZODone 50 MG tablet Commonly known as: DESYREL TAKE 1 TO 2 TABLETS BY MOUTH AT BEDTIME. What changed: how much to take   Vitamin B-12 CR 1000 MCG Tbcr Take 1,000 mcg by mouth daily.   VITAMIN D3 PO Take 1 tablet by mouth daily.   zinc gluconate 50 MG tablet Take 50 mg by mouth daily.        Follow-up Information     Patwardhan, Reynold Bowen, MD. Schedule an appointment as soon as possible for a visit in 2 week(s).   Specialties: Cardiology, Radiology Contact information: [address] [place] [address] [phone]         Narda Amber  K, DO. Schedule an appointment as soon as possible for a visit in 1 month(s).   Specialty: Neurology Contact information: [place] [address] [place] [address] [phone]         [name], [name], MD Follow up.   Specialty: Internal Medicine Contact information: [place] [address] [phone]                Allergies  Allergen Reactions   Abilify [Aripiprazole] Shortness Of Breath   Cymbalta [Duloxetine Hcl] Other (See Comments)    Constipation    Digoxin And Related Other (See Comments)    HR 41 (patient is unsure about this)   Prednisolone Swelling and Other (See Comments)    Edema, weight gain   Temazepam Other (See Comments)    "Made me feel crazy"   Other Rash and Other (See Comments)    Zomax/Zomepirac- Welts, also    Consultations: Neurology Cardiology    Procedures/Studies: CT ANGIO HEAD NECK W WO CM  Result Date: 06/08/2021 CLINICAL DATA:  Follow-up examination for acute stroke. EXAM: CT ANGIOGRAPHY HEAD AND NECK TECHNIQUE: Multidetector CT imaging of the head and neck was performed using the standard protocol during bolus administration of intravenous contrast. Multiplanar CT image reconstructions and MIPs were obtained to evaluate the  vascular anatomy. Carotid stenosis measurements (when applicable) are obtained utilizing NASCET criteria, using the distal internal carotid diameter as the denominator. RADIATION DOSE REDUCTION: This exam was performed according to the departmental dose-optimization program which includes automated exposure control, adjustment of the mA and/or kV according to patient size and/or use of iterative reconstruction technique. CONTRAST:  151m OMNIPAQUE IOHEXOL 350 MG/ML SOLN COMPARISON:  Prior MRI from earlier the same day. FINDINGS: CT HEAD FINDINGS Brain: Cerebral volume within normal limits. Previously identified small acute and chronic pontine infarcts noted, better evaluated on prior MRI. No acute intracranial hemorrhage. No other acute large vessel territory infarct. No mass lesion or midline shift. No hydrocephalus or extra-axial fluid collection. Vascular: No hyperdense vessel. Scattered vascular calcifications noted within the carotid siphons. Skull: Scalp soft tissues and calvarium within normal limits. Sinuses: Mild mucosal thickening noted within the ethmoidal air cells. Paranasal sinuses and mastoid air cells are otherwise clear. Orbits: Globes and orbital soft tissues demonstrate no acute finding. Review of the MIP images confirms the above findings CTA NECK FINDINGS Aortic arch: Visualized aortic arch normal caliber with normal branch pattern. No stenosis about the origin of the great vessels. Right carotid system: Right common and internal carotid arteries widely patent without stenosis, dissection or occlusion. Left carotid system: Left common and internal carotid arteries widely patent without stenosis, dissection or occlusion. Vertebral arteries: Both vertebral arteries arise from the subclavian arteries. No proximal subclavian artery stenosis. Both vertebral arteries widely patent without stenosis, dissection or occlusion. Skeleton: No discrete or worrisome osseous lesions. Prior ACDF at C3 through  C7. 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 Extensive bulky osteophytic spurring within the visualized upper thoracic spine, suggesting DISH. Other neck: No other acute soft tissue abnormality within the neck. Upper chest: Visualized upper chest demonstrates no acute finding. Review of the MIP images confirms the above findings CTA HEAD FINDINGS Anterior circulation: Both internal carotid arteries patent to the termini without stenosis or other abnormality. Left A1 widely patent. Right A1 hypoplastic and/or absent, accounting for the diminutive right ICA is compared to the left. Normal anterior communicating artery complex. Anterior cerebral arteries patent to their distal aspects without significant stenosis.  Normal in stenosis or occlusion. Normal MCA bifurcations. Distal MCA branches perfused and symmetric. Posterior circulation: Both V4 segments patent to the vertebrobasilar junction without significant stenosis. Left vertebral artery dominant. Left PICA patent. Right PICA not seen. Moderate atheromatous irregularity within the basilar artery without high-grade stenosis. Superior cerebellar arteries patent bilaterally. Predominant fetal type origin of the right PCA supplied via a robust right posterior communicating artery. Tiny 2 mm outpouching at the proximal right P2 segment favored to reflect a vascular infundibulum related to a hypoplastic right P1 segment. Moderate atheromatous irregularity throughout the right PCA without high-grade stenosis. The contralateral left PCA is primarily supplied via the basilar and is severely attenuated and only faintly visible. Left PCA appears to remain grossly patent to its distal aspect. Venous sinuses: Patent allowing for timing the contrast bolus. Anatomic variants: Predominant fetal type origin of the right PCA. Hypoplastic/absent right A1 with the ACAs supplied via the left carotid artery system. No aneurysm. Review of the MIP images confirms the above findings  IMPRESSION: 1. Negative CTA for large vessel occlusion or other emergent finding. 2. Moderate atheromatous disease involving the intracranial posterior circulation without correctable stenosis. The left PCA is severely attenuated and only faintly visible, but remains grossly patent to its distal aspect. Predominant fetal type origin of the right PCA. 3. Wide patency of the major arterial vasculature of the neck. Electronically Signed   By: Jeannine Boga M.D.   On: 06/08/2021 21:25   MR BRAIN W WO CONTRAST  Result Date: 06/08/2021 CLINICAL DATA:  Tongue paralysis/weakness (CN 12).  Dysarthria. EXAM: MRI HEAD WITHOUT AND WITH CONTRAST TECHNIQUE: Multiplanar, multiecho pulse sequences of the brain and surrounding structures were obtained without and with intravenous contrast. CONTRAST:  9.12m GADAVIST GADOBUTROL 1 MMOL/ML IV SOLN COMPARISON:  Head CT 02/27/2005 FINDINGS: Brain: A 7 mm acute infarct is present in the left paramedian pons. A separate, smaller chronic infarct is noted more laterally in the pons on the left. T2 hyperintensities in the cerebral white matter bilaterally are nonspecific but compatible with minimal chronic small vessel ischemic disease, not advanced for age. No intracranial hemorrhage, mass, midline shift, or extra-axial fluid collection is identified. There is no abnormal intracranial enhancement. There is mild generalized cerebral atrophy which is within normal limits for age. Vascular: Major intracranial vascular flow voids are preserved. Skull and upper cervical spine: Unremarkable bone marrow signal. Sinuses/Orbits: Bilateral cataract extraction. Clear paranasal sinuses. Trace right mastoid fluid. Other: None. IMPRESSION: Small acute and chronic pontine infarcts. Electronically Signed   By: ALogan BoresM.D.   On: 06/08/2021 18:38       Discharge Exam: Vitals:   06/09/21 0800 06/09/21 1037  BP: (!) 141/50 (!) 174/62  Pulse: (!) 57 (!) 58  Resp: 13 15  Temp: 98 F  (36.7 C) 98 F (36.7 C)  SpO2: 97% 99%    General: Pt is alert, awake, not in acute distress Cardiovascular: RRR, S1/S2 +, no edema Respiratory: CTA bilaterally, no wheezing, no rhonchi, no respiratory distress, no conversational dyspnea  Abdominal: Soft, NT, ND, bowel sounds + Extremities: no edema, no cyanosis CNS: CN 2-12 grossly intact, resting facial asymmetry with right lower lip drooping (family states this is his baseline facial structure), smile is symmetric  Psych: Normal mood and affect, stable judgement and insight     The results of significant diagnostics from this hospitalization (including imaging, microbiology, ancillary and laboratory) are listed below for reference.     Microbiology: Recent Results (from the past  240 hour(s))  Resp Panel by RT-PCR (Flu A&B, Covid) Nasopharyngeal Swab     Status: None   Collection Time: 06/08/21  1:28 PM   Specimen: Nasopharyngeal Swab; Nasopharyngeal(NP) swabs in vial transport medium  Result Value Ref Range Status   SARS Coronavirus 2 by RT PCR NEGATIVE NEGATIVE Final    Comment: (NOTE) SARS-CoV-2 target nucleic acids are NOT DETECTED.  The SARS-CoV-2 RNA is generally detectable in upper respiratory specimens during the acute phase of infection. The lowest concentration of SARS-CoV-2 viral copies this assay can detect is 138 copies/mL. A negative result does not preclude SARS-Cov-2 infection and should not be used as the sole basis for treatment or other patient management decisions. A negative result may occur with  improper specimen collection/handling, submission of specimen other than nasopharyngeal swab, presence of viral mutation(s) within the areas targeted by this assay, and inadequate number of viral copies(<138 copies/mL). A negative result must be combined with clinical observations, patient history, and epidemiological information. The expected result is Negative.  Fact Sheet for Patients:   EntrepreneurPulse.com.au  Fact Sheet for Healthcare Providers:  IncredibleEmployment.be  This test is no t yet approved or cleared by the [place] FDA and  has been authorized for detection and/or diagnosis of SARS-CoV-2 by FDA under an Emergency Use Authorization (EUA). This EUA will remain  in effect (meaning this test can be used) for the duration of the COVID-19 declaration under Section 564(b)(1) of the Act, 21 U.S.C.section 360bbb-3(b)(1), unless the authorization is terminated  or revoked sooner.       Influenza A by PCR NEGATIVE NEGATIVE Final   Influenza B by PCR NEGATIVE NEGATIVE Final    Comment: (NOTE) The Xpert Xpress SARS-CoV-2/FLU/RSV plus assay is intended as an aid in the diagnosis of influenza from Nasopharyngeal swab specimens and should not be used as a sole basis for treatment. Nasal washings and aspirates are unacceptable for Xpert Xpress SARS-CoV-2/FLU/RSV testing.  Fact Sheet for Patients: EntrepreneurPulse.com.au  Fact Sheet for Healthcare Providers: IncredibleEmployment.be  This test is not yet approved or cleared by the [place] FDA and has been authorized for detection and/or diagnosis of SARS-CoV-2 by FDA under an Emergency Use Authorization (EUA). This EUA will remain in effect (meaning this test can be used) for the duration of the COVID-19 declaration under Section 564(b)(1) of the Act, 21 U.S.C. section 360bbb-3(b)(1), unless the authorization is terminated or revoked.  Performed at [facility], [place] [address], [place], [place] [address]      Labs: BNP (last 3 results) No results for input(s): BNP in the last 8760 hours. Basic Metabolic Panel: Recent Labs  Lab 06/03/21 1455 06/08/21 1328 06/08/21 1344  NA 137 136 139  K 4.2 4.1 4.2  CL 102 101 102  CO2 27 25  --   GLUCOSE 80 93 88  BUN '19 15 17  '$ CREATININE 0.83 0.88 0.80  CALCIUM 9.4 9.2   --    Liver Function Tests: Recent Labs  Lab 06/03/21 1455 06/08/21 1328  AST 16 23  ALT 12 19  ALKPHOS 108 107  BILITOT 0.4 0.6  PROT 6.8 6.7  ALBUMIN 3.9 3.6   No results for input(s): LIPASE, AMYLASE in the last 168 hours. No results for input(s): AMMONIA in the last 168 hours. CBC: Recent Labs  Lab 06/03/21 1455 06/08/21 1328 06/08/21 1344  WBC 5.8 6.1  --   NEUTROABS 3.6 4.1  --   HGB 13.2 14.2 13.9  HCT 38.7* 42.2 41.0  MCV 89.7 91.7  --   PLT 183.0 199  --    Cardiac Enzymes: No results for input(s): CKTOTAL, CKMB, CKMBINDEX, TROPONINI in the last 168 hours. BNP: Invalid input(s): POCBNP CBG: No results for input(s): GLUCAP in the last 168 hours. D-Dimer No results for input(s): DDIMER in the last 72 hours. Hgb A1c No results for input(s): HGBA1C in the last 72 hours. Lipid Profile Recent Labs    06/09/21 0031  CHOL 157  HDL 33*  LDLCALC 104*  TRIG 99  CHOLHDL 4.8   Thyroid function studies No results for input(s): TSH, T4TOTAL, T3FREE, THYROIDAB in the last 72 hours.  Invalid input(s): FREET3 Anemia work up No results for input(s): VITAMINB12, FOLATE, FERRITIN, TIBC, IRON, RETICCTPCT in the last 72 hours. Urinalysis    Component Value Date/Time   COLORURINE YELLOW 06/08/2021 1312   APPEARANCEUR CLEAR 06/08/2021 1312   LABSPEC 1.009 06/08/2021 1312   PHURINE 7.0 06/08/2021 1312   GLUCOSEU NEGATIVE 06/08/2021 1312   GLUCOSEU NEGATIVE 04/05/2019 1446   HGBUR NEGATIVE 06/08/2021 1312   BILIRUBINUR NEGATIVE 06/08/2021 1312   KETONESUR NEGATIVE 06/08/2021 1312   PROTEINUR NEGATIVE 06/08/2021 1312   UROBILINOGEN 0.2 04/05/2019 1446   NITRITE NEGATIVE 06/08/2021 1312   LEUKOCYTESUR NEGATIVE 06/08/2021 1312   Sepsis Labs Invalid input(s): PROCALCITONIN,  WBC,  LACTICIDVEN Microbiology Recent Results (from the past 240 hour(s))  Resp Panel by RT-PCR (Flu A&B, Covid) Nasopharyngeal Swab     Status: None   Collection Time: 06/08/21  1:28 PM    Specimen: Nasopharyngeal Swab; Nasopharyngeal(NP) swabs in vial transport medium  Result Value Ref Range Status   SARS Coronavirus 2 by RT PCR NEGATIVE NEGATIVE Final    Comment: (NOTE) SARS-CoV-2 target nucleic acids are NOT DETECTED.  The SARS-CoV-2 RNA is generally detectable in upper respiratory specimens during the acute phase of infection. The lowest concentration of SARS-CoV-2 viral copies this assay can detect is 138 copies/mL. A negative result does not preclude SARS-Cov-2 infection and should not be used as the sole basis for treatment or other patient management decisions. A negative result may occur with  improper specimen collection/handling, submission of specimen other than nasopharyngeal swab, presence of viral mutation(s) within the areas targeted by this assay, and inadequate number of viral copies(<138 copies/mL). A negative result must be combined with clinical observations, patient history, and epidemiological information. The expected result is Negative.  Fact Sheet for Patients:  EntrepreneurPulse.com.au  Fact Sheet for Healthcare Providers:  IncredibleEmployment.be  This test is no t yet approved or cleared by the [place] FDA and  has been authorized for detection and/or diagnosis of SARS-CoV-2 by FDA under an Emergency Use Authorization (EUA). This EUA will remain  in effect (meaning this test can be used) for the duration of the COVID-19 declaration under Section 564(b)(1) of the Act, 21 U.S.C.section 360bbb-3(b)(1), unless the authorization is terminated  or revoked sooner.       Influenza A by PCR NEGATIVE NEGATIVE Final   Influenza B by PCR NEGATIVE NEGATIVE Final    Comment: (NOTE) The Xpert Xpress SARS-CoV-2/FLU/RSV plus assay is intended as an aid in the diagnosis of influenza from Nasopharyngeal swab specimens and should not be used as a sole basis for treatment. Nasal washings and aspirates are  unacceptable for Xpert Xpress SARS-CoV-2/FLU/RSV testing.  Fact Sheet for Patients: EntrepreneurPulse.com.au  Fact Sheet for Healthcare Providers: IncredibleEmployment.be  This test is not yet approved or cleared by the [place] FDA and has been authorized for detection  and/or diagnosis of SARS-CoV-2 by FDA under an Emergency Use Authorization (EUA). This EUA will remain in effect (meaning this test can be used) for the duration of the COVID-19 declaration under Section 564(b)(1) of the Act, 21 U.S.C. section 360bbb-3(b)(1), unless the authorization is terminated or revoked.  Performed at [facility], [place] [address] [place], [place] [address]      Patient was seen and examined on the day of discharge and was found to be in stable condition. Time coordinating discharge: 40 minutes including assessment and coordination of care, as well as examination of the patient.   SIGNED:  Dessa Phi, DO [facility] 06/09/2021, 2:48 PM

## 2021-06-09 NOTE — Progress Notes (Signed)
?  Transition of Care (TOC) Screening Note ? ? ?Patient Details  ?Name: Vernon Bishop ?Date of Birth: 1941/03/02 ? ? ?Transition of Care (TOC) CM/SW Contact:    ?Cyndi Bender, RN ?Phone Number: ?06/09/2021, 8:21 AM ? ? ? ?Transition of Care Department [facility]) has reviewed patient and no TOC needs have been identified at this time. We will continue to monitor patient advancement through interdisciplinary progression rounds. If new patient transition needs arise, please place a TOC consult. ? ? ?

## 2021-06-09 NOTE — Progress Notes (Signed)
? ?  Echocardiogram ?2D Echocardiogram has been performed. ? ?Vernon Bishop ?06/09/2021, 2:48 PM ?

## 2021-06-09 NOTE — Evaluation (Addendum)
Occupational Therapy Evaluation ?Patient Details ?Name: Vernon Bishop ?MRN: [id number] ?DOB: 03-30-1941 ?Today's Date: 06/09/2021 ? ? ?History of Present Illness Pt is a 81 y/o male presenting on 3/12 with weakness, speech difficulty. MRI with 7 mm acute infarct in L paramedian pons, as well as chronic infarct in L pons (more laterally).  PMH includes: afib, HTN, pulmonary HTN, bradycardia, OSA on CPAP.  ? ?Clinical Impression ?  ?PTA patient independent with most ADLs (needs assist with socks), mobility using cane in community. Admitted for above and presents with problem list below including dysarthria, impaired cognition and impaired balance.  Pt currently requires up to min guard for mobility, up to min to min guard assist for ADLs.  Pt will difficulty during dual cognitive tasking during mobility requiring min cueing for problem solving, will benefit from further assessment. He will have support from spouse at dc.  Believe he will benefit from continued OT services while admitted to optimize independence and safety with return to ADLs and IADLs. Will follow.  ?  ?BP EOB [birth year] ?BP post 174/62  ? ?Recommendations for follow up therapy are one component of a multi-disciplinary discharge planning process, led by the attending physician.  Recommendations may be updated based on patient status, additional functional criteria and insurance authorization.  ? ?Follow Up Recommendations ? No OT follow up  ?  ?Assistance Recommended at Discharge Intermittent Supervision/Assistance  ?Patient can return home with the following A little help with walking and/or transfers;A little help with bathing/dressing/bathroom;Direct supervision/assist for medications management;Direct supervision/assist for financial management;Assistance with cooking/housework ? ?  ?Functional Status Assessment ? Patient has had a recent decline in their functional status and demonstrates the ability to make significant improvements in function in a  reasonable and predictable amount of time.  ?Equipment Recommendations ? None recommended by OT  ?  ?Recommendations for Other Services PT consult;Speech consult ? ? ?  ?Precautions / Restrictions Precautions ?Precautions: Fall ?Precaution Comments: recent back surgery 12/22 ?Required Braces or Orthoses: Other Brace ?Other Brace: reports AFO to L LE (uses in commuinty) but not present on eval ?Restrictions ?Weight Bearing Restrictions: No  ? ?  ? ?Mobility Bed Mobility ?Overal bed mobility: Modified Independent ?  ?  ?  ?  ?  ?  ?General bed mobility comments: HOB elevated ?  ? ?Transfers ?Overall transfer level: Needs assistance ?Equipment used: None ?Transfers: Sit to/from Stand ?Sit to Stand: Supervision ?  ?  ?  ?  ?  ?General transfer comment: increased effort, min cueing for posture but no physical assist required ?  ? ?  ?Balance Overall balance assessment: Mild deficits observed, not formally tested ?  ?  ?  ?  ?  ?  ?  ?  ?  ?  ?  ?  ?  ?  ?  ?  ?  ?  ?   ? ?ADL either performed or assessed with clinical judgement  ? ?ADL Overall ADL's : Needs assistance/impaired ?  ?  ?Grooming: Supervision/safety;Standing ?  ?  ?  ?  ?  ?Upper Body Dressing : Set up;Sitting ?  ?Lower Body Dressing: Minimal assistance;Sit to/from stand ?Lower Body Dressing Details (indicate cue type and reason): requires assist with socks at baseline ?Toilet Transfer: Min guard;Ambulation ?Toilet Transfer Details (indicate cue type and reason): simulated in room ?  ?  ?  ?  ?Functional mobility during ADLs: Min guard;Cueing for safety ?General ADL Comments: pt with mild unsteadiness, decreased activity tolerance and dsyarthric speech  ? ? ? ?  Vision   ?Vision Assessment?: No apparent visual deficits  ?   ?Perception   ?  ?Praxis   ?  ? ?Pertinent Vitals/Pain Pain Assessment ?Pain Assessment: No/denies pain  ? ? ? ?Hand Dominance Right ?  ?Extremity/Trunk Assessment Upper Extremity Assessment ?Upper Extremity Assessment: Generalized weakness  (reports baseline peripheral tingling B hands) ?  ?Lower Extremity Assessment ?Lower Extremity Assessment: Defer to PT evaluation ?  ?Cervical / Trunk Assessment ?Cervical / Trunk Assessment: Other exceptions ?Cervical / Trunk Exceptions: recent back surgery ?  ?Communication Communication ?Communication: Expressive difficulties (dysarthrtic) ?  ?Cognition Arousal/Alertness: Awake/alert ?Behavior During Therapy: [facility] for tasks assessed/performed ?Overall Cognitive Status: Impaired/Different from baseline ?Area of Impairment: Problem solving, Attention ?  ?  ?  ?  ?  ?  ?  ?  ?  ?Current Attention Level: Selective ?  ?  ?  ?  ?Problem Solving: Slow processing, Requires verbal cues ?General Comments: mild difficulty with dual cog task in hallway, cueing to attend to and complete task ?  ?  ?General Comments  functional mobility in hallway, mild unsteadiness and staggering gait ? ?  ?Exercises   ?  ?Shoulder Instructions    ? ? ?Home Living Family/patient expects to be discharged to:: Private residence ?Living Arrangements: Spouse/significant other ?Available Help at Discharge: Family;Available 24 hours/day ?Type of Home: House ?Home Access: Stairs to enter ?Entrance Stairs-Number of Steps: 5 steps ?Entrance Stairs-Rails: Can reach both ?Home Layout: One level ?  ?  ?Bathroom Shower/Tub: Tub/shower unit ?  ?Bathroom Toilet: Handicapped height ?  ?  ?Home Equipment: Conservation officer, nature (2 wheels);Cane - single point;Shower seat;BSC/3in1 ?  ?  ?  ? ?  ?Prior Functioning/Environment Prior Level of Function : Independent/Modified Independent;Driving ?  ?  ?  ?  ?  ?  ?Mobility Comments: Indep, uses cane in community ?ADLs Comments: Indep, manages meds ?  ? ?  ?  ?OT Problem List: Decreased strength;Decreased activity tolerance;Impaired balance (sitting and/or standing);Decreased cognition;Decreased knowledge of precautions;Decreased knowledge of use of DME or AE ?  ?   ?OT Treatment/Interventions: Self-care/ADL  training;Therapeutic exercise;DME and/or AE instruction;Therapeutic activities;Cognitive remediation/compensation;Balance training;Patient/family education  ?  ?OT Goals(Current goals can be found in the care plan section) Acute Rehab OT Goals ?Patient Stated Goal: home ?OT Goal Formulation: With patient ?Time For Goal Achievement: 06/23/21 ?Potential to Achieve Goals: Good  ?OT Frequency: Min 2X/week ?  ? ?Co-evaluation   ?  ?  ?  ?  ? ?  ?AM-PAC OT "6 Clicks" Daily Activity     ?Outcome Measure Help from another person eating meals?: None ?Help from another person taking care of personal grooming?: A Little ?Help from another person toileting, which includes using toliet, bedpan, or urinal?: A Little ?Help from another person bathing (including washing, rinsing, drying)?: A Little ?Help from another person to put on and taking off regular upper body clothing?: A Little ?Help from another person to put on and taking off regular lower body clothing?: A Little ?6 Click Score: 19 ?  ?End of Session Nurse Communication: Mobility status;Other (comment) (BP) ? ?Activity Tolerance: Patient tolerated treatment well ?Patient left: in bed;with call bell/phone within reach;with bed alarm set;with family/visitor present ? ?OT Visit Diagnosis: Other abnormalities of gait and mobility (R26.89);Muscle weakness (generalized) (M62.81);Other symptoms and signs involving the nervous system (R29.898)  ?              ?Time: 2440-1027 ?OT Time Calculation (min): 22 min ?Charges:  OT General Charges ?$OT  Visit: 1 Visit ?OT Evaluation ?$OT Eval Low Complexity: 1 Low ? ?Jolaine Artist, OT ?Acute Rehabilitation Services ?Pager [phone] ?Office [phone] ? ? ?Delight Stare ?06/09/2021, 9:34 AM ?

## 2021-06-09 NOTE — Plan of Care (Signed)
?  Problem: Education: ?Goal: Knowledge of disease or condition will improve ?Outcome: Progressing ?Goal: Knowledge of secondary prevention will improve (SELECT ALL) ?Outcome: Progressing ?Goal: Knowledge of patient specific risk factors will improve (INDIVIDUALIZE FOR PATIENT) ?Outcome: Progressing ?  ?Problem: Coping: ?Goal: Will verbalize positive feelings about self ?Outcome: Progressing ?Goal: Will identify appropriate support needs ?Outcome: Progressing ?  ?Problem: Health Behavior/Discharge Planning: ?Goal: Ability to manage health-related needs will improve ?Outcome: Progressing ?  ?Problem: Self-Care: ?Goal: Ability to participate in self-care as condition permits will improve ?Outcome: Progressing ?Goal: Verbalization of feelings and concerns over difficulty with self-care will improve ?Outcome: Progressing ?Goal: Ability to communicate needs accurately will improve ?Outcome: Progressing ?  ?Problem: Nutrition: ?Goal: Risk of aspiration will decrease ?Outcome: Progressing ?  ?Problem: Ischemic Stroke/TIA Tissue Perfusion: ?Goal: Complications of ischemic stroke/TIA will be minimized ?Outcome: Progressing ?  ?Problem: Education: ?Goal: Knowledge of General Education information will improve ?Description: Including pain rating scale, medication(s)/side effects and non-pharmacologic comfort measures ?Outcome: Progressing ?  ?

## 2021-06-09 NOTE — Progress Notes (Signed)
STROKE TEAM PROGRESS NOTE   INTERVAL HISTORY His wife is at the bedside. Pt lying in bed, stated that his speech and tongue numbness improved but not resolved yet. He had blurry vision for 10 min 2 days ago but has resolved, denies double vision though. He also had mild HA caused by MRI exam, also resolved.   Vitals:   06/08/21 2355 06/09/21 0155 06/09/21 0548 06/09/21 0732  BP: (!) 164/74 (!) 127/46 (!) 134/56 (!) 135/55  Pulse: (!) 45 (!) 45 (!) 40 (!) 57  Resp: '15 15 14 15  '$ Temp: 98 F (36.7 C) 97.9 F (36.6 C) 97.8 F (36.6 C) 97.9 F (36.6 C)  TempSrc: Oral Oral Oral Oral  SpO2: 98% 98% 97% 97%  Weight:      Height:       CBC:  Recent Labs  Lab 06/03/21 1455 06/08/21 1328 06/08/21 1344  WBC 5.8 6.1  --   NEUTROABS 3.6 4.1  --   HGB 13.2 14.2 13.9  HCT 38.7* 42.2 41.0  MCV 89.7 91.7  --   PLT 183.0 199  --    Basic Metabolic Panel:  Recent Labs  Lab 06/03/21 1455 06/08/21 1328 06/08/21 1344  NA 137 136 139  K 4.2 4.1 4.2  CL 102 101 102  CO2 27 25  --   GLUCOSE 80 93 88  BUN '19 15 17  '$ CREATININE 0.83 0.88 0.80  CALCIUM 9.4 9.2  --    Lipid Panel:  Recent Labs  Lab 06/09/21 0031  CHOL 157  TRIG 99  HDL 33*  CHOLHDL 4.8  VLDL 20  LDLCALC 104*   HgbA1c:  Recent Labs  Lab 06/03/21 1455  HGBA1C 5.0   Urine Drug Screen:  Recent Labs  Lab 06/08/21 1312  LABOPIA NONE DETECTED  COCAINSCRNUR NONE DETECTED  LABBENZ NONE DETECTED  AMPHETMU NONE DETECTED  THCU NONE DETECTED  LABBARB NONE DETECTED    Alcohol Level  Recent Labs  Lab 06/08/21 2157  ETH <10    IMAGING past 24 hours CT ANGIO HEAD NECK W WO CM  Result Date: 06/08/2021 CLINICAL DATA:  Follow-up examination for acute stroke. EXAM: CT ANGIOGRAPHY HEAD AND NECK TECHNIQUE: Multidetector CT imaging of the head and neck was performed using the standard protocol during bolus administration of intravenous contrast. Multiplanar CT image reconstructions and MIPs were obtained to evaluate  the vascular anatomy. Carotid stenosis measurements (when applicable) are obtained utilizing NASCET criteria, using the distal internal carotid diameter as the denominator. RADIATION DOSE REDUCTION: This exam was performed according to the departmental dose-optimization program which includes automated exposure control, adjustment of the mA and/or kV according to patient size and/or use of iterative reconstruction technique. CONTRAST:  142m OMNIPAQUE IOHEXOL 350 MG/ML SOLN COMPARISON:  Prior MRI from earlier the same day. FINDINGS: CT HEAD FINDINGS Brain: Cerebral volume within normal limits. Previously identified small acute and chronic pontine infarcts noted, better evaluated on prior MRI. No acute intracranial hemorrhage. No other acute large vessel territory infarct. No mass lesion or midline shift. No hydrocephalus or extra-axial fluid collection. Vascular: No hyperdense vessel. Scattered vascular calcifications noted within the carotid siphons. Skull: Scalp soft tissues and calvarium within normal limits. Sinuses: Mild mucosal thickening noted within the ethmoidal air cells. Paranasal sinuses and mastoid air cells are otherwise clear. Orbits: Globes and orbital soft tissues demonstrate no acute finding. Review of the MIP images confirms the above findings CTA NECK FINDINGS Aortic arch: Visualized aortic arch normal caliber with normal branch  pattern. No stenosis about the origin of the great vessels. Right carotid system: Right common and internal carotid arteries widely patent without stenosis, dissection or occlusion. Left carotid system: Left common and internal carotid arteries widely patent without stenosis, dissection or occlusion. Vertebral arteries: Both vertebral arteries arise from the subclavian arteries. No proximal subclavian artery stenosis. Both vertebral arteries widely patent without stenosis, dissection or occlusion. Skeleton: No discrete or worrisome osseous lesions. Prior ACDF at C3  through C7. 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 Extensive bulky osteophytic spurring within the visualized upper thoracic spine, suggesting DISH. Other neck: No other acute soft tissue abnormality within the neck. Upper chest: Visualized upper chest demonstrates no acute finding. Review of the MIP images confirms the above findings CTA HEAD FINDINGS Anterior circulation: Both internal carotid arteries patent to the termini without stenosis or other abnormality. Left A1 widely patent. Right A1 hypoplastic and/or absent, accounting for the diminutive right ICA is compared to the left. Normal anterior communicating artery complex. Anterior cerebral arteries patent to their distal aspects without significant stenosis. Normal in stenosis or occlusion. Normal MCA bifurcations. Distal MCA branches perfused and symmetric. Posterior circulation: Both V4 segments patent to the vertebrobasilar junction without significant stenosis. Left vertebral artery dominant. Left PICA patent. Right PICA not seen. Moderate atheromatous irregularity within the basilar artery without high-grade stenosis. Superior cerebellar arteries patent bilaterally. Predominant fetal type origin of the right PCA supplied via a robust right posterior communicating artery. Tiny 2 mm outpouching at the proximal right P2 segment favored to reflect a vascular infundibulum related to a hypoplastic right P1 segment. Moderate atheromatous irregularity throughout the right PCA without high-grade stenosis. The contralateral left PCA is primarily supplied via the basilar and is severely attenuated and only faintly visible. Left PCA appears to remain grossly patent to its distal aspect. Venous sinuses: Patent allowing for timing the contrast bolus. Anatomic variants: Predominant fetal type origin of the right PCA. Hypoplastic/absent right A1 with the ACAs supplied via the left carotid artery system. No aneurysm. Review of the MIP images confirms the above findings  IMPRESSION: 1. Negative CTA for large vessel occlusion or other emergent finding. 2. Moderate atheromatous disease involving the intracranial posterior circulation without correctable stenosis. The left PCA is severely attenuated and only faintly visible, but remains grossly patent to its distal aspect. Predominant fetal type origin of the right PCA. 3. Wide patency of the major arterial vasculature of the neck. Electronically Signed   By: Jeannine Boga M.D.   On: 06/08/2021 21:25   MR BRAIN W WO CONTRAST  Result Date: 06/08/2021 CLINICAL DATA:  Tongue paralysis/weakness (CN 12).  Dysarthria. EXAM: MRI HEAD WITHOUT AND WITH CONTRAST TECHNIQUE: Multiplanar, multiecho pulse sequences of the brain and surrounding structures were obtained without and with intravenous contrast. CONTRAST:  9.20m GADAVIST GADOBUTROL 1 MMOL/ML IV SOLN COMPARISON:  Head CT 02/27/2005 FINDINGS: Brain: A 7 mm acute infarct is present in the left paramedian pons. A separate, smaller chronic infarct is noted more laterally in the pons on the left. T2 hyperintensities in the cerebral white matter bilaterally are nonspecific but compatible with minimal chronic small vessel ischemic disease, not advanced for age. No intracranial hemorrhage, mass, midline shift, or extra-axial fluid collection is identified. There is no abnormal intracranial enhancement. There is mild generalized cerebral atrophy which is within normal limits for age. Vascular: Major intracranial vascular flow voids are preserved. Skull and upper cervical spine: Unremarkable bone marrow signal. Sinuses/Orbits: Bilateral cataract extraction. Clear paranasal sinuses. Trace  right mastoid fluid. Other: None. IMPRESSION: Small acute and chronic pontine infarcts. Electronically Signed   By: Logan Bores M.D.   On: 06/08/2021 18:38    PHYSICAL EXAM  Temp:  [97.8 F (36.6 C)-98.7 F (37.1 C)] 97.9 F (36.6 C) (03/13 0732) Pulse Rate:  [38-69] 57 (03/13 0732) Resp:   [13-21] 15 (03/13 0732) BP: (127-171)/(46-74) 135/55 (03/13 0732) SpO2:  [97 %-100 %] 97 % (03/13 0732) Weight:  [96.2 kg] 96.2 kg (03/12 1311)  General - Well nourished, well developed, in no apparent distress. Ophthalmologic - fundi not visualized due to noncooperation. Cardiovascular - Regular rhythm and rate.  Mental Status -  Level of arousal and orientation to time, place, and person were intact. Language including expression, naming, repetition, comprehension was assessed and found intact. Fund of Knowledge was assessed and was intact.  Cranial Nerves II - XII - II - Visual field intact OU. III, IV, VI - Extraocular movements intact. No gaze disconjugate V - Facial sensation intact bilaterally. VII - mild right nasolabial fold flattening VIII - Hearing & vestibular intact bilaterally. No nystagmus X - Palate elevates symmetrically. XI - Chin turning & shoulder shrug intact bilaterally. XII - Tongue protrusion intact.  Motor Strength - The patients strength was normal in all extremities and pronator drift was absent except chronic left ankle DF 4/5 (previous foot drop).  Bulk was normal and fasciculations were absent.   Motor Tone - Muscle tone was assessed at the neck and appendages and was normal. Reflexes - The patients reflexes were symmetrical in all extremities and he had no pathological reflexes. Sensory - Light touch, temperature/pinprick were assessed and were symmetrical.   Coordination - The patient had normal movements in the hands and feet with no ataxia or dysmetria.  Tremor was absent. Gait and Station - deferred.   ASSESSMENT/PLAN Vernon Bishop is a 81 y.o. male with history of remote history of atrial fibrillation, HTN, pulmonary HTN, bradycardia, OSA on CPAP presenting with dysarthria and numbness/tingling of the tongue.   Stroke:  left paramedian pontine infarcts likely small vessel disease given location but could be embolic given hx of PAF not on  AC MRI  Small acute and chronic pontine infarcts. CTA head & neck The left PCA is severely attenuated and only faintly visible, but remains grossly patent to its distal aspect. Predominant fetal type origin of the right PCA. 2D Echo pending LDL 104 HgbA1c pending VTE prophylaxis - SCDs No antithrombotic prior to admission, now on aspirin 81 mg daily and clopidogrel 75 mg daily. Discussed with pt cardiologist Dr. Virgina Jock, will switch to eliquis given PAF not on [facility] Therapy recommendations:  Outpt speech Disposition:  Pending  PAF Hx of remote afib  No anticoagulation at home 14 day monitoring did not show afib earlier this month Follows with Dr. Virgina Jock cardiology Discussed with pt cardiologist Dr. Virgina Jock, will switch to eliquis given PAF not on [facility]  Hypertension Home meds:  Propranolol, norvasc, lasix '20mg'$   Stable on the high end Gradually normalize BP in 2-3 days Long-term BP goal normotensive  Hyperlipidemia LDL 104, goal < 70 Add Atorvastatin '40mg'$   Continue statin at discharge  Other Stroke Risk Factors Advanced Age >/= 64  Former Cigarette smoker Obstructive sleep apnea, on CPAP at home  Other Active Problems Bradycardia PHTN Chronic left foot drop - mild  Hospital day # 0  Neurology will sign off. Please call with questions. Pt will follow up with Dr. Posey Pronto at [facility] in about  4 weeks. Thanks for the consult.  Rosalin Hawking, MD PhD Stroke Neurology 06/09/2021 1:11 PM   To contact Stroke Continuity provider, please refer to [email or web address] After hours, contact General Neurology

## 2021-06-09 NOTE — Discharge Instructions (Signed)

## 2021-06-09 NOTE — Progress Notes (Signed)
PT Cancellation/Discharge Note ? ?Patient Details ?Name: Vernon Bishop ?MRN: [id number] ?DOB: 04/18/1940 ? ? ?Cancelled Treatment:    Reason Eval/Treat Not Completed: PT screened, no needs identified, will sign off ? ?Patient reports he was up with OT earlier today and is at his baseline. Reports he just finished OPPT after his back surgery and has a home exercise program he is doing. No needs identified. Wife present and agreed.  ? ? ?Arby Barrette, PT ?Acute Rehabilitation Services  ?Pager [phone] ?Office [phone] ? ? ? ?Jeanie Cooks Russell Quinney ?06/09/2021, 3:41 PM ?

## 2021-06-10 ENCOUNTER — Telehealth: Payer: Self-pay | Admitting: Internal Medicine

## 2021-06-10 DIAGNOSIS — R4701 Aphasia: Secondary | ICD-10-CM

## 2021-06-10 NOTE — Telephone Encounter (Signed)
Pt is needing an order for [facility] re[facility] speech therapy on [address].  ? ? ?States sent it over, but was told that provider needed to send it over- because in the comment section she put "physical therapy" and they can not use that order.  ?

## 2021-06-10 NOTE — Telephone Encounter (Signed)
Vernon Bishop was admitted ?

## 2021-06-11 ENCOUNTER — Ambulatory Visit: Payer: PPO | Attending: Internal Medicine | Admitting: Speech Pathology

## 2021-06-11 ENCOUNTER — Other Ambulatory Visit: Payer: Self-pay

## 2021-06-11 DIAGNOSIS — R471 Dysarthria and anarthria: Secondary | ICD-10-CM | POA: Diagnosis not present

## 2021-06-11 NOTE — Therapy (Signed)
?OUTPATIENT SPEECH LANGUAGE PATHOLOGY EVALUATION ? ? ?Patient Name: Vernon Bishop ?MRN: [id number] ?DOB:1940/08/31, 81 y.o., male ?Today's Date: 06/11/2021 ? ?PCP: [name], [name], MD ?REFERRING PROVIDER: Dessa Phi, DO ? ? End of Session - 06/11/21 0951   ? ? Visit Number 1   ? Number of Visits 1   ? SLP Start Time [phone]   ? SLP Stop Time  0931   ? SLP Time Calculation (min) 39 min   ? Activity Tolerance Patient tolerated treatment well   ? ?  ?  ? ?  ? ? ?Past Medical History:  ?Diagnosis Date  ? Arthralgia   ? Atrial fibrillation ([place])   ? 1992  ? Benign prostatic hyperplasia   ? Bradycardia   ? "my doctor says I have bradycardia, my heart rate is always in the 40s"  ? DDD (degenerative disc disease)   ? Depression   ? Diabetes mellitus   ? type II  - PT STATES NOT ON ANY DIABETIC MEDICINES  ? Dysrhythmia   ? aFIB  ? Dyssomnia   ? ED (erectile dysfunction)   ? Hyperlipidemia   ? Hypertension   ? Hypogonadism male   ? Joint pain   ? LBP (low back pain)   ? Multiple actinic keratoses   ? Muscle weakness   ? Upper limb  ? MVP (mitral valve prolapse)   ? Neck pain   ? OSA on CPAP   ? cpap  ? Osteoarthritis of hip   ? Osteoarthritis, hand   ? SEVERE LOWER BACK PAIN, AND RESTLESS LEG SYNDROME  ? Otalgia of right ear   ? Pain in left knee   ? Pain in thoracic spine   ? Palpitations   ? PTSD (post-traumatic stress disorder)   ? Shoulder pain   ? Subjective visual disturbance   ? Swallowing problem   ? HX OF PILLS GETTING "STUCK" IN THROAT AT TIMES  ? Swelling   ? Type 2 diabetes mellitus ([place])   ? pt states he has never had it  ? Vitamin D deficiency   ? ?Past Surgical History:  ?Procedure Laterality Date  ? ABDOMINAL EXPOSURE N/A 03/12/2021  ? Procedure: ABDOMINAL EXPOSURE;  Surgeon: Marty Heck, MD;  Location: [facility];  Service: Vascular;  Laterality: N/A;  ? ANTERIOR LAT LUMBAR FUSION N/A 03/12/2021  ? Procedure: ANTERIOR LATERAL LUMBAR FUSION 2 LEVELS;  Surgeon: Melina Schools, MD;  Location: [facility];   Service: Orthopedics;  Laterality: N/A;  ? APPENDECTOMY    ? BACK SURGERY  1987  ? CATARACT EXTRACTION    ? CERVICAL LAMINECTOMY  2004  ? [name], rod was placed- HAS ROM LIMITATIONS  ? CHOLECYSTECTOMY    ? COLONOSCOPY    ? JOINT REPLACEMENT    ? KNEE SURGERY    ? left knee/arthroscopic  ? TOTAL HIP ARTHROPLASTY Right 03/14/2013  ? Procedure: RIGHT TOTAL HIP ARTHROPLASTY ANTERIOR APPROACH;  Surgeon: [name], MD;  Location: [facility];  Service: Orthopedics;  Laterality: Right;  ? TOTAL HIP ARTHROPLASTY Left 05/23/2019  ? Procedure: TOTAL HIP ARTHROPLASTY ANTERIOR APPROACH;  Surgeon: Paralee Cancel, MD;  Location: [facility];  Service: Orthopedics;  Laterality: Left;  70 mins  ? ?Patient Active Problem List  ? Diagnosis Date Noted  ? Dysarthria   ? Stroke [facility] 06/08/2021  ? HTN (hypertension) 06/08/2021  ? Paroxysmal atrial fibrillation ([place]) 06/08/2021  ? Postoperative anemia 06/03/2021  ? S/P lumbar fusion 03/12/2021  ? History of atrial fibrillation 01/22/2021  ?  Pulmonary hypertension, unspecified ([place]) 01/20/2021  ? Chronic back pain 01/07/2021  ? S/P hip replacement, left 05/23/2019  ? Ear pain, right 04/05/2019  ? Leg weakness, bilateral 02/01/2019  ? Arm weakness 04/07/2018  ? BPH (benign prostatic hyperplasia) 04/23/2017  ? Hip pain, chronic, left 05/06/2016  ? Delusional thoughts ([place]) 05/06/2016  ? CTS (carpal tunnel syndrome) 07/29/2015  ? Ulnar neuropathy 07/29/2015  ? Generalized anxiety disorder 07/29/2015  ? Pre-op evaluation 01/28/2015  ? Insomnia 04/13/2014  ? Left knee pain 10/10/2013  ? Delusional disorder ([place]) 10/10/2013  ? Overweight (BMI 25.0-29.9) 03/15/2013  ? S/P total hip arthroplasty 03/14/2013  ? Hip osteoarthritis 03/06/2013  ? Bradycardia 10/09/2012  ? Sinusitis, acute 06/29/2012  ? Actinic keratoses 05/24/2012  ? Hyponatremia 10/23/2011  ? Edema 10/23/2011  ? Shoulder pain, acute 07/13/2011  ? Arthralgia 06/08/2011  ? Cervical pain 01/28/2011  ? Neoplasm of uncertain behavior of skin  06/16/2010  ? Sinus bradycardia 03/17/2010  ? SINUSITIS, ACUTE 12/11/2009  ? PAIN IN THORACIC SPINE 04/26/2009  ? TOBACCO USE, QUIT 02/19/2009  ? OSA on CPAP 11/15/2008  ? Hypogonadism male 05/02/2008  ? Vitamin B12 deficiency 08/26/2007  ? VISUAL CHANGES 08/25/2007  ? OTHER CONVULSIONS 08/25/2007  ? SLEEP DEPRIVATION 08/25/2007  ? Palpitations 08/25/2007  ? HIP PAIN 04/29/2007  ? Osteoarthritis 02/15/2007  ? Dyslipidemia 02/14/2007  ? Depression 02/14/2007  ? Atrial fibrillation ([place]) 02/14/2007  ? Seborrheic dermatitis 02/14/2007  ? Diabetes mellitus type 2, diet-controlled ([place]) 01/11/2007  ? Essential hypertension 01/11/2007  ? LOW BACK PAIN 01/11/2007  ? ? ?ONSET DATE: 06/08/2021 ? ?REFERRING DIAG: I63.40 (ICD-10-CM) - Cerebrovascular accident (CVA) due to embolism of cerebral artery ([place])  ? ?THERAPY DIAG:  ?Dysarthria and anarthria ? ?SUBJECTIVE:  ? ?SUBJECTIVE STATEMENT: ?"My tongue is a little numb and sometimes I have hard time getting words out." ?Pt accompanied by: significant other ? ?PERTINENT HISTORY: Pt is a 81 y/o male presenting on 3/12 to ED with weakness, speech difficulty. MRI with 7 mm acute infarct in L paramedian pons, as well as chronic infarct in L pons (more laterally).  PMH includes: afib, HTN, pulmonary HTN, bradycardia, OSA on CPAP.  Seen by ST as IP, recommended f/u with OPST.  ? ?PAIN:  ?Are you having pain? Yes: NPRS scale: 5/10 ?Pain location: lower back ?Pain description: aching pain ?Aggravating factors: activity levels ?Relieving factors: rest ? ?FALLS: Has patient fallen in last 6 months?  No ? ?LIVING ENVIRONMENT: ?Lives with: lives with their spouse ?Lives in: House/apartment ? ?PLOF:  ?Level of assistance: Independent with ADLs ?Employment: Retired ? ?PATIENT GOALS "I don't think I need Speech Therapy." ? ?OBJECTIVE:  ? ?DIAGNOSTIC FINDINGS: 06/08/2021 MR Brain w/o contrast: "Brain: A 7 mm acute infarct is present in the left paramedian pons. A separate, smaller chronic infarct  is noted more laterally in the pons on the left. T2 hyperintensities in the cerebral white matter bilaterally are nonspecific but compatible with minimal chronic small vessel ischemic disease, not advanced for age. No intracranial hemorrhage, mass, midline shift, or extra-axial fluid collection is identified. There is no abnormal intracranial enhancement. There is mild generalized cerebral atrophy which is within normal limits for age." ? ?COGNITION: ?Overall cognitive status: Within functional limits for tasks assessed ?Comments: Reports some memory issues, consistent with baseline prior to acute event. No concerns regarding managing ADLs to include medications, finances, appointments, cooking, housekeeping, etc. Pt's report endorsed by spouse. No further cognitive work-up indicated at this time.  ? ?MOTOR SPEECH: ?Overall  motor speech: impaired ?Level of impairment: Conversation ?Respiration: thoracic breathing ?Phonation: hoarse ?Resonance: WFL ?Articulation: Impaired: conversation ?Intelligibility: Intelligible ?Motor planning: Appears intact ?Motor speech errors: aware ?Effective technique: slow rate, over articulate, pause, and pacing ? ?ORAL MOTOR ASSESSMENT:   ?WFL ?Minimal incoordination/groping of tongue noted but able to archive postures indicated by ST.  ? ? ?OBJECTIVE ASSESSMENT: ? ?SLP administers Tikofsky's 50-word Intelligibility Test to appreciate pt's intelligibility at word level, based on his subjective c/o decreased articulation 2/2 tongue mobility. With pt reading words in randomized order, he is intelligible on 48/50 words. Indicating intelligibility rating of 96% Errors are in final position cluster and initial "sh." During conversational sample, pt approximately 95% intelligible.  ? ?PATIENT REPORTED OUTCOME MEASURES (PROM): ?Communication Participation Item Bank: 80. Indicates "a little" difficulty in areas of having long conversation, communicating quickly, getting turn in fast moving  conversation, communicating in small group.  ? ? ?TODAY'S TREATMENT:  ?Provide patient and spouse with education on evaluation results, ST recommendations. Coaching on strategy of using clear speech through

## 2021-06-11 NOTE — Patient Instructions (Signed)
Ways You Can Compensate for Dysarthria ? ?There are many strategies that can help you communicate effectively with dysarthria. The checked strategies may be the best for you. ? ?For the Speaker: ?_____ Speak loudly and slowly. Concentrate on speaking clearly and loudly. Sometimes Speech Pathologists call this overarticulation. You can think about exaggerating your mouth movements. The extra effort will help slow your speech and help others to hear you better.  ?_____ Modify your environment. Make the environment quiet by turning off the TV and radio. Make sure the listener can see your face--this helps the listener to receive non-verbal cues.  ?_____ Ask the listener for help. Let the listener that it's okay to ask you to repeat and to tell you when they don't understand. ?_____ Provide a topic of conversation. Letting the listener know what you are talking about will help them to ?fill in the gaps?.  ?_____ Don't shift topics quickly. Use transitions.  ?_____ Get your listener's attention before talking. ?_____ Be careful with your tone of voice. Sometimes we use subtle shifts in tone to convey humor or sarcasm. Dysarthria can make it harder to use tones, so you may need to verbalize humor. ?_____ Use short and simple sentences. Short, simple sentences are easier to understand. You may also you predictable sentences to increase understanding.  ?_____ Avoid communicating over long distances.  ?_____ Use communication aids or alternate methods of communication. ?_____ Keep an alphabet board and use it to point to letters to spell hard to understand words.  ?_____ Use the communication board/book provided by your Speech-Language Pathologist. ?_____ Write down parts of your message using a whiteboard.  ?_____ Remember SLOB.  Slow, Loud, Overarticulate, Breathe.  ? ?For the Listener: ?_____ Let the speaker know when you do not understand. In public you can even use a signal. ?_____ Repeat the part of the message that  you have understood. Then the speaker does not have to say the entire message again. ?_____ Give prompts. Remind the speaker of their strategies. (For example: ?Say it slower? or ?Please say that louder.?) ?_____ Remind the speaker to use their communication aid.  ?_____ Ask yes / no questions to help. ?_____ Acknowledge the communication failure and say you will try again later.  ?_____ Establish guidelines for facilitating communications with others. Sometimes you will be the ?translator? for your loved one. Know if he or she would like to help with misunderstood part of the message, answering long questions, or ordering food in a restaurant. Talk ahead of time about how to handle upcoming situations.  ?

## 2021-06-12 NOTE — Telephone Encounter (Signed)
Called and left Kristie Case Manager a message informing her that the referral for the patient was updated  ?

## 2021-06-16 ENCOUNTER — Telehealth: Payer: Self-pay | Admitting: Internal Medicine

## 2021-06-16 NOTE — Telephone Encounter (Signed)
Patient spouse calling in ? ?Patient had mild stroke 06/08/21.Marland Kitchen started feeling weak/fatigue last week after starting new med atorvastatin (LIPITOR) 40 MG tablet which was prescribed by ED provider ? ?Would like cb from nurse to discuss if weak/fatigue feeling is coming from new med [phone] ?

## 2021-06-17 ENCOUNTER — Telehealth: Payer: Self-pay | Admitting: Internal Medicine

## 2021-06-17 DIAGNOSIS — R002 Palpitations: Secondary | ICD-10-CM | POA: Diagnosis not present

## 2021-06-17 NOTE — Telephone Encounter (Signed)
This feeling of fatigue and weakness could be coming from the stroke.  Lipitor would usually produce muscle or joint aches as well. ?However, [name] can stop Lipitor for 2 to 3 days to see if the symptoms are better. ?Thanks ?

## 2021-06-17 NOTE — Telephone Encounter (Signed)
Connected to [facility] 3.18.2023. ? ?Wife states her husband had a mild stroke last week. She states he started Lipitor and Eliquist on Monday night. Started with muscle weakness yesterday, progressively worse today. ? ?Advised to see HCP within 4 hours.  ? ? ?

## 2021-06-18 NOTE — Telephone Encounter (Signed)
Called and talked to patient spouse in regards to Dr. Alain Marion recommendation Patient spouse verbalize understanding. No further questions  ?

## 2021-06-23 ENCOUNTER — Encounter: Payer: Self-pay | Admitting: Cardiology

## 2021-06-23 ENCOUNTER — Ambulatory Visit: Payer: PPO | Admitting: Cardiology

## 2021-06-23 ENCOUNTER — Other Ambulatory Visit: Payer: Self-pay

## 2021-06-23 VITALS — BP 148/59 | HR 58 | Temp 98.2°F | Resp 17 | Ht 72.0 in | Wt 215.0 lb

## 2021-06-23 DIAGNOSIS — I480 Paroxysmal atrial fibrillation: Secondary | ICD-10-CM

## 2021-06-23 DIAGNOSIS — I639 Cerebral infarction, unspecified: Secondary | ICD-10-CM

## 2021-06-23 DIAGNOSIS — I10 Essential (primary) hypertension: Secondary | ICD-10-CM

## 2021-06-23 MED ORDER — OLMESARTAN MEDOXOMIL-HCTZ 20-12.5 MG PO TABS: 1.0000 | ORAL_TABLET | Freq: Every day | ORAL | 3 refills | Status: DC

## 2021-06-23 MED ORDER — APIXABAN 5 MG PO TABS: 5.0000 mg | ORAL_TABLET | Freq: Two times a day (BID) | ORAL | 3 refills | Status: DC

## 2021-06-23 NOTE — Progress Notes (Signed)
? ? ?Patient referred by Vernon Bishop, Vernon Lacks, MD for pulmonary hypertension, preoperative risk stratification ? ?Subjective:  ? ?[name], male    DOB: Feb 03, 1941, 81 y.o.   MRN: [id number] ? ?Chief Complaint  ?Patient presents with  ? Atrial Fibrillation  ? Follow-up  ?  2-3 weeks for stroke  ? ? ? ?HPI ? ?81 y.o. Caucasian male with hypertension, OSA, remote h/o Afib, pontine stroke (05/2021), pulmonary hypertension ? ?Patient was hospitalized earlier in March 2023 after sudden onset speech difficulty and numbness and tingling of the tongue.  He was diagnosed with small acute on chronic pontine infarcts.  I personally discussed with Dr. Erlinda Hong regarding possibility of Cardiologic stroke.  Patient does indeed have a remote history of atrial fibrillation many years ago, but had not been on anticoagulation at any point.  Recent cardiac monitor did not show any A-fib.  However, the above stroke happened after patient had completed wearing his monitor.  Given strong suspicion for paroxysmal A-fib being etiology for potentially cardioembolic stroke, I recommended starting anticoagulation with Eliquis 5 mg twice daily. ? ?Patient is doing well since his hospital discharge after stroke.  His only deficit is mild tongue numbness and vision changes, which have also improved.  Patient did not tolerate statin due to severe fatigue which is completely resolved after stopping atorvastatin 40 mg daily. ? ?Initial consultation visit 12/2020: ?Patient is here with his wife today.  He was originally scheduled to undergo L4-L5 OLIF surgery with Dr. Rolena Infante, assisted by Dr. Carlis Abbott abdominal exposure.  However, surgery was delayed due to recent finding of pulmonary hypertension on echocardiogram.  He was referred to me for evaluation and management of the same, along with preoperative cardiac risk stratification. ? ?Patient is retired, previously worked as a Programmer, applications.  Currently, his physical activity is significantly limited due  to his back pain.  He walks with a cane or a grocery store cart when he is shopping.  He does not have to climb a flight of stairs anywhere.  He walks with a slow pace.  With this level of physical activity, he denies any significant chest pain or shortness of breath symptoms. ? ?He has had longstanding history of OSA, on CPAP for at least 10 years.  He quit smoking in 1970s.  He had atrial fibrillation in 1992, after which he was hospitalized for a day.  He had spontaneous self conversion back to sinus rhythm.  He did report significant palpitations with his A. fib in 1992.  Since then, he has not experienced similar palpitation symptoms, but does report "skipped beat" every now and then. ? ? ?Current Outpatient Medications:  ?  acetaminophen (TYLENOL) 325 MG tablet, Take 325-650 mg by mouth every 6 (six) hours as needed for mild pain or headache., Disp: , Rfl:  ?  amLODipine (NORVASC) 5 MG tablet, Take 1 tablet (5 mg total) by mouth daily., Disp: 90 tablet, Rfl: 3 ?  apixaban (ELIQUIS) 5 MG TABS tablet, Take 1 tablet (5 mg total) by mouth 2 (two) times daily., Disp: 60 tablet, Rfl: 1 ?  atorvastatin (LIPITOR) 40 MG tablet, Take 1 tablet (40 mg total) by mouth daily., Disp: 30 tablet, Rfl: 1 ?  Cholecalciferol (VITAMIN D3 PO), Take 1 tablet by mouth daily., Disp: , Rfl:  ?  clotrimazole-betamethasone (LOTRISONE) cream, Apply 1 application. topically 2 (two) times daily. (Patient taking differently: Apply 1 application. topically daily as needed (to affected areas of the face).), Disp: 45 g, Rfl: 1 ?  Cyanocobalamin (VITAMIN B-12 CR) 1000 MCG TBCR, Take 1,000 mcg by mouth daily.  , Disp: , Rfl:  ?  FLUoxetine (PROZAC) 40 MG capsule, TAKE 1 CAPSULE BY MOUTH DAILY. (Patient taking differently: Take 40 mg by mouth in the morning.), Disp: 90 capsule, Rfl: 2 ?  furosemide (LASIX) 20 MG tablet, TAKE 1 TABLET (20 MG TOTAL) BY MOUTH DAILY. (Patient taking differently: Take 20 mg by mouth daily as needed for edema.), Disp:  30 tablet, Rfl: 3 ?  gabapentin (NEURONTIN) 300 MG capsule, TAKE 1 CAPSULE BY MOUTH THREE TIMES A DAY (Patient taking differently: Take 300 mg by mouth 3 (three) times daily.), Disp: 90 capsule, Rfl: 11 ?  PRESCRIPTION MEDICATION, CPAP- At bedtime, Disp: , Rfl:  ?  propranolol (INDERAL) 80 MG tablet, TAKE 1 TABLET BY MOUTH 3 TIMES DAILY. (Patient taking differently: Take 80 mg by mouth 3 (three) times daily.), Disp: 270 tablet, Rfl: 3 ?  sodium chloride (OCEAN) 0.65 % SOLN nasal spray, Place 1 spray into both nostrils as needed for congestion., Disp: , Rfl:  ?  tetrahydrozoline 0.05 % ophthalmic solution, Place 1 drop into both eyes 2 (two) times daily as needed (for redness)., Disp: , Rfl:  ?  tiZANidine (ZANAFLEX) 4 MG tablet, TAKE 1 TABLET BY MOUTH EVERY 8 HOURS AS NEEDED FOR MUSCLE SPASMS. (Patient taking differently: Take 4 mg by mouth every 8 (eight) hours as needed for muscle spasms.), Disp: 60 tablet, Rfl: 1 ?  traZODone (DESYREL) 50 MG tablet, TAKE 1 TO 2 TABLETS BY MOUTH AT BEDTIME. (Patient taking differently: Take 100 mg by mouth at bedtime.), Disp: 60 tablet, Rfl: 5 ?  zinc gluconate 50 MG tablet, Take 50 mg by mouth daily., Disp: , Rfl:  ? ? ?Cardiovascular and other pertinent studies: ? ?Mobile cardiac telemetry 13 days 04/25/2021 - 05/09/2021: ?Dominant rhythm: Sinus. ?HR 36-152 bpm. Avg HR 52 bpm. ?52 episodes of atrial tachycardia/SVT, fastest at 135 bpm for 13 beats, longest for 15.9 secs at 100 bpm. ?2.6% isolated SVE, <1% couplet/triplets. ?1 episode of VT, at 105 bpm for 5 beats ?<1% isolated VE, couplet/triplets. ?No atrial fibrillation/atrial flutter/high grade AV block, sinus pause >3sec noted. ?0 patient triggered events.  ?  ?MRI brain 06/08/2021: ?Brain: A 7 mm acute infarct is present in the left paramedian pons. ?A separate, smaller chronic infarct is noted more laterally in the ?pons on the left. T2 hyperintensities in the cerebral white matter ?bilaterally are nonspecific but compatible  with minimal chronic ?small vessel ischemic disease, not advanced for age. No intracranial ?hemorrhage, mass, midline shift, or extra-axial fluid collection is ?identified. There is no abnormal intracranial enhancement. There is ?mild generalized cerebral atrophy which is within normal limits for ?age. ?  ?Vascular: Major intracranial vascular flow voids are preserved. ?  ?Skull and upper cervical spine: Unremarkable bone marrow signal. ?  ?Sinuses/Orbits: Bilateral cataract extraction. Clear paranasal ?sinuses. Trace right mastoid fluid. ?  ?Other: None. ?  ?IMPRESSION: ?Small acute and chronic pontine infarcts. ? ?CTA head/neck 06/08/2021: ?1. Negative CTA for large vessel occlusion or other emergent ?finding. ?2. Moderate atheromatous disease involving the intracranial ?posterior circulation without correctable stenosis. The left PCA is ?severely attenuated and only faintly visible, but remains grossly ?patent to its distal aspect. Predominant fetal type origin of the ?right PCA. ?3. Wide patency of the major arterial vasculature of the neck. ?  ?  ? ?Lexiscan Tetrofosmin stress test 01/31/2021: ?Lexiscan nuclear stress test performed using 1-day protocol. ?SPECT images show decreased tracer  uptake in inferior myocardium, more prominent at rest. In absence of myocardial thickening or wall motion abnormality, this likely represents attenuation artifact. Stress LVEF 52%. ?Low risk study. ? ?EKG 01/22/2021: ?Sinus bradycardia 48 bpm  ? ?Echocardiogram 01/15/2021: ? 1. Left ventricular ejection fraction, by estimation, is 55 to 60%. The  ?left ventricle has normal function. The left ventricle has no regional  ?wall motion abnormalities. Left ventricular diastolic parameters are  ?consistent with Grade I diastolic dysfunction (impaired relaxation).  ? 2. Right ventricular systolic function is normal. The right ventricular  ?size is normal. There is moderately elevated pulmonary artery systolic  ?pressure. The estimated  right ventricular systolic pressure is 34.9 mmHg.  ? 3. Left atrial size was moderately dilated.  ? 4. The mitral valve is normal in structure. Trivial mitral valve  ?regurgitation. No evidence of mitra

## 2021-06-25 ENCOUNTER — Encounter: Payer: Self-pay | Admitting: Neurology

## 2021-06-25 ENCOUNTER — Ambulatory Visit: Payer: PPO | Admitting: Neurology

## 2021-06-25 ENCOUNTER — Other Ambulatory Visit: Payer: Self-pay

## 2021-06-25 VITALS — BP 163/74 | HR 50 | Ht 72.0 in | Wt 211.0 lb

## 2021-06-25 DIAGNOSIS — I6340 Cerebral infarction due to embolism of unspecified cerebral artery: Secondary | ICD-10-CM | POA: Diagnosis not present

## 2021-06-25 MED ORDER — PRAVASTATIN SODIUM 20 MG PO TABS: 20.0000 mg | ORAL_TABLET | Freq: Every day | ORAL | 1 refills | Status: DC

## 2021-06-25 NOTE — Patient Instructions (Addendum)
Start pravastatin '20mg'$  daily ? ?Start home speech exercises ? ?Return to clinic in 6-8 months ?

## 2021-06-25 NOTE — Progress Notes (Signed)
? ? ?Follow-up Visit ? ? ?Date: 06/25/21 ? ? ?Vernon Bishop ?MRN: [id number] ?DOB: 07-24-1940 ? ? ?Interim History: ?Vernon Bishop is a 81 y.o. right-handed Caucasian male with PAF, hypertension, hyperlipidemia, s/p cervical and lumbar surgery returning to the clinic for follow-up of pontine stroke.  The patient was accompanied to the clinic by wife who also provides collateral information.   ? ?He presented to [facility] ER with sudden onset of speech difficulty and tingling of tongue on 06/08/2021.  MRI brain confirmed small acute on chronic pontine infarcts.  Stroke team discussed with cardiology about possible cardioembolic source for his stroke, given remote history of PAF, not on anticoagulation.  It was decided to start him on anticoagulation with Eliquis '5mg'$  BID.  He was discharged on lipitor '40mg'$  daily, however stopped this over the weekend due to severe fatigue and weakness.  Symptoms resolved after stopping medication. ? ?Currently, he reports very mild residual tongue tingling and intermittent dysarthria.  He has ongoing imbalance from lumbar degenerative changes and underwent extensive lumbar surgery by Dr. Rolena Infante in December 2022.  Surgery significantly helped his low back and leg pain, as well as improved his foot weakness.   ? ?He also has left knee pain and hopes to have surgery for this also.  He walks with a cane, but admits he does not have adequate support.  Fortunately, he has not had any falls. ? ? ?Medications:  ?Current Outpatient Medications on File Prior to Visit  ?Medication Sig Dispense Refill  ? acetaminophen (TYLENOL) 325 MG tablet Take 325-650 mg by mouth every 6 (six) hours as needed for mild pain or headache.    ? amLODipine (NORVASC) 5 MG tablet Take 1 tablet (5 mg total) by mouth daily. 90 tablet 3  ? apixaban (ELIQUIS) 5 MG TABS tablet Take 1 tablet (5 mg total) by mouth 2 (two) times daily. 180 tablet 3  ? Cholecalciferol (VITAMIN D3 PO) Take 1 tablet by mouth daily.    ?  clotrimazole-betamethasone (LOTRISONE) cream Apply 1 application. topically 2 (two) times daily. (Patient taking differently: Apply 1 application. topically daily as needed (to affected areas of the face).) 45 g 1  ? Cyanocobalamin (VITAMIN B-12 CR) 1000 MCG TBCR Take 1,000 mcg by mouth daily.      ? FLUoxetine (PROZAC) 40 MG capsule TAKE 1 CAPSULE BY MOUTH DAILY. (Patient taking differently: Take 40 mg by mouth in the morning.) 90 capsule 2  ? furosemide (LASIX) 20 MG tablet TAKE 1 TABLET (20 MG TOTAL) BY MOUTH DAILY. (Patient taking differently: Take 20 mg by mouth daily as needed for edema.) 30 tablet 3  ? gabapentin (NEURONTIN) 300 MG capsule TAKE 1 CAPSULE BY MOUTH THREE TIMES A DAY (Patient taking differently: Take 300 mg by mouth 3 (three) times daily.) 90 capsule 11  ? olmesartan-hydrochlorothiazide (BENICAR HCT) 20-12.5 MG tablet Take 1 tablet by mouth daily. 30 tablet 3  ? PRESCRIPTION MEDICATION CPAP- At bedtime    ? propranolol (INDERAL) 80 MG tablet TAKE 1 TABLET BY MOUTH 3 TIMES DAILY. (Patient taking differently: Take 80 mg by mouth 3 (three) times daily.) 270 tablet 3  ? sodium chloride (OCEAN) 0.65 % SOLN nasal spray Place 1 spray into both nostrils as needed for congestion.    ? tetrahydrozoline 0.05 % ophthalmic solution Place 1 drop into both eyes 2 (two) times daily as needed (for redness).    ? tiZANidine (ZANAFLEX) 4 MG tablet TAKE 1 TABLET BY MOUTH EVERY 8 HOURS AS  NEEDED FOR MUSCLE SPASMS. (Patient taking differently: Take 4 mg by mouth every 8 (eight) hours as needed for muscle spasms.) 60 tablet 1  ? traZODone (DESYREL) 50 MG tablet TAKE 1 TO 2 TABLETS BY MOUTH AT BEDTIME. (Patient taking differently: Take 100 mg by mouth at bedtime.) 60 tablet 5  ? zinc gluconate 50 MG tablet Take 50 mg by mouth daily.    ? ?No current facility-administered medications on file prior to visit.  ? ? ?Allergies:  ?Allergies  ?Allergen Reactions  ? Abilify [Aripiprazole] Shortness Of Breath  ? Cymbalta  [Duloxetine Hcl] Other (See Comments)  ?  Constipation ?  ? Digoxin And Related Other (See Comments)  ?  HR 41 (patient is unsure about this)  ? Prednisolone Swelling and Other (See Comments)  ?  Edema, weight gain  ? Temazepam Other (See Comments)  ?  "Made me feel crazy"  ? Other Rash and Other (See Comments)  ?  Zomax/Zomepirac- Welts, also  ? ? ?Vital Signs:  ?BP (!) 163/74   Pulse (!) 50   Ht 6' (1.829 m)   Wt 211 lb (95.7 kg)   SpO2 100%   BMI 28.62 kg/m?  ? ?Neurological Exam: ?MENTAL STATUS including orientation to time, place, person, recent and remote memory, attention span and concentration, language, and fund of knowledge is normal.  Speech is not dysarthric. ? ?CRANIAL NERVES:  No visual field defects.  Pupils equal round and reactive to light.  Normal conjugate, extra-ocular eye movements in all directions of gaze.  No ptosis.  Face is symmetric. Palate elevates symmetrically.  Tongue is midline. ? ?MOTOR:  Motor strength is 5/5 in all extremities, except 4/5 with left dorsiflexion, toe extension, eversion and inversion.  No atrophy, fasciculations or abnormal movements.  No pronator drift.  Tone is normal.   ? ?MSRs:  Reflexes are 2+/4 throughout and absent at the ankles. ? ?SENSORY:  Absent vibration at the ankles bilaterally, intact above the knees. ? ?COORDINATION/GAIT:  Normal finger-to- nose-finger.  Intact rapid alternating movements bilaterally.  Gait appears unsteady, assisted with cane, slow, stooped and antalgic. ? ?Data: ?NCS/EMG of the left side 06/06/2019: ?Chronic sensorimotor axonal polyneuropathy affecting the left lower extremity, severe. ?Chronic L5 radiculopathy affecting the left lower extremity, severe. ?Left ulnar axonal neuropathy,  nonlocalizable, severe. ?Sparse myopathic motor units were isolated to the rectus femoris muscle and too limited for definitive diagnosis.  Correlate clinically.  ? ?Mobile cardiac telemetry 13 days 04/25/2021 - 05/09/2021: ?Dominant rhythm:  Sinus. ?HR 36-152 bpm. Avg HR 52 bpm. ?52 episodes of atrial tachycardia/SVT, fastest at 135 bpm for 13 beats, longest for 15.9 secs at 100 bpm. ?2.6% isolated SVE, <1% couplet/triplets. ?1 episode of VT, at 105 bpm for 5 beats ?<1% isolated VE, couplet/triplets. ?No atrial fibrillation/atrial flutter/high grade AV block, sinus pause >3sec noted. ?0 patient triggered events.  ?  ?MRI brain 06/08/2021: ?Small acute and chronic pontine infarcts. ?  ?CTA head/neck 06/08/2021: ?1. Negative CTA for large vessel occlusion or other emergent finding. ?2. Moderate atheromatous disease involving the intracranial posterior circulation without correctable stenosis. The left PCA is severely attenuated and only faintly visible, but remains grossly patent to its distal aspect. Predominant fetal type origin of the right PCA. ?3. Wide patency of the major arterial vasculature of the neck. ?  ?MRI lumbar spine 10/24/2020: ?1. At L3-L4, severe canal and left subarticular recess stenosis with ?moderate to severe left foraminal stenosis. ?2. At L2-L3, severe right subarticular recess stenosis, moderate ?canal  stenosis, and mild-to-moderate right foraminal stenosis with ?right far lateral/extraforaminal disc closely approximating the ?exiting/exited right L2 nerve. ?3. At L4-L5, grade 1 anterolisthesis with severe bilateral ?subarticular recess stenosis, mild to moderate canal stenosis, and ?moderate left foraminal stenosis. ?4. At L5-S1, moderate left foraminal stenosis with left far lateral ?osteophyte contacting the exiting left L5 nerve. ?5. At L1-L2, moderate right subarticular and foraminal stenosis with ?mild to moderate canal stenosis. ?  ?  ?Lab Results  ?Component Value Date  ? CHOL 157 06/09/2021  ? HDL 33 (L) 06/09/2021  ? LDLCALC 104 (H) 06/09/2021  ? TRIG 99 06/09/2021  ? CHOLHDL 4.8 06/09/2021  ? ?Lab Results  ?Component Value Date  ? HGBA1C 5.0 06/09/2021  ? ? ?  ? ?IMPRESSION/PLAN: ?Left paramedian pontine infracts,  etiology small vessel disease vs. Embolic (history of paroxysmal atrial fibrillation not on AC), manifesting with tongue numbness and dysarthria.  He was started on Eliquis and lipitor '40mg'$  daily at discharge.  Clinically, he has

## 2021-06-28 DIAGNOSIS — G4733 Obstructive sleep apnea (adult) (pediatric): Secondary | ICD-10-CM | POA: Diagnosis not present

## 2021-07-01 DIAGNOSIS — I10 Essential (primary) hypertension: Secondary | ICD-10-CM | POA: Diagnosis not present

## 2021-07-02 LAB — BASIC METABOLIC PANEL
BUN/Creatinine Ratio: 22 (ref 10–24)
BUN: 19 mg/dL (ref 8–27)
CO2: 24 mmol/L (ref 20–29)
Calcium: 8.9 mg/dL (ref 8.6–10.2)
Chloride: 101 mmol/L (ref 96–106)
Creatinine, Ser: 0.88 mg/dL (ref 0.76–1.27)
Glucose: 91 mg/dL (ref 70–99)
Potassium: 4.1 mmol/L (ref 3.5–5.2)
Sodium: 141 mmol/L (ref 134–144)
eGFR: 87 mL/min/{1.73_m2} (ref >59)

## 2021-07-23 ENCOUNTER — Ambulatory Visit: Payer: PPO | Admitting: Cardiology

## 2021-07-23 ENCOUNTER — Encounter: Payer: Self-pay | Admitting: Cardiology

## 2021-07-23 VITALS — BP 162/61 | HR 50 | Temp 97.7°F | Resp 16 | Ht 72.0 in | Wt 214.0 lb

## 2021-07-23 DIAGNOSIS — I10 Essential (primary) hypertension: Secondary | ICD-10-CM | POA: Diagnosis not present

## 2021-07-23 DIAGNOSIS — I639 Cerebral infarction, unspecified: Secondary | ICD-10-CM | POA: Diagnosis not present

## 2021-07-23 DIAGNOSIS — I272 Pulmonary hypertension, unspecified: Secondary | ICD-10-CM

## 2021-07-23 DIAGNOSIS — I480 Paroxysmal atrial fibrillation: Secondary | ICD-10-CM | POA: Diagnosis not present

## 2021-07-23 NOTE — Progress Notes (Signed)
? ? ?Patient referred by Vernon Bishop Lacks, MD for pulmonary hypertension, preoperative risk stratification ? ?Subjective:  ? ?Vernon Bishop, male    DOB: 1940/09/27, 81 y.o.   MRN: [id number] ? ?Chief Complaint  ?Patient presents with  ? Hypertension  ? Follow-up  ? ? ? ?HPI ? ?81 y.o. Caucasian male with hypertension, OSA, remote h/o Afib, pontine stroke (05/2021), pulmonary hypertension ? ?Patient is doing well.  Blood pressure slightly elevated today, but has been very well controlled recently at home.  He is improving after his recent stroke. ? ?Initial consultation visit 12/2020: ?Patient is here with his wife today.  He was originally scheduled to undergo L4-L5 OLIF surgery with Dr. Rolena Infante, assisted by Dr. Carlis Abbott for abdominal exposure.  However, surgery was delayed due to recent finding of pulmonary hypertension on echocardiogram.  He was referred to me for evaluation and management of the same, along with preoperative cardiac risk stratification. ? ?Patient is retired, previously worked as a Programmer, applications.  Currently, his physical activity is significantly limited due to his back pain.  He walks with a cane or a grocery store cart when he is shopping.  He does not have to climb a flight of stairs anywhere.  He walks with a slow pace.  With this level of physical activity, he denies any significant chest pain or shortness of breath symptoms. ? ?He has had longstanding history of OSA, on CPAP for at least 10 years.  He quit smoking in 1970s.  He had atrial fibrillation in 1992, after which he was hospitalized for a day.  He had spontaneous self conversion back to sinus rhythm.  He did report significant palpitations with his A. fib in 1992.  Since then, he has not experienced similar palpitation symptoms, but does report "skipped beat" every now and then. ? ? ?Current Outpatient Medications:  ?  acetaminophen (TYLENOL) 325 MG tablet, Take 325-650 mg by mouth every 6 (six) hours as needed for mild pain or  headache., Disp: , Rfl:  ?  amLODipine (NORVASC) 5 MG tablet, Take 1 tablet (5 mg total) by mouth daily., Disp: 90 tablet, Rfl: 3 ?  apixaban (ELIQUIS) 5 MG TABS tablet, Take 1 tablet (5 mg total) by mouth 2 (two) times daily., Disp: 180 tablet, Rfl: 3 ?  Cholecalciferol (VITAMIN D3 PO), Take 1 tablet by mouth daily., Disp: , Rfl:  ?  clotrimazole-betamethasone (LOTRISONE) cream, Apply 1 application. topically 2 (two) times daily. (Patient taking differently: Apply 1 application. topically daily as needed (to affected areas of the face).), Disp: 45 g, Rfl: 1 ?  Cyanocobalamin (VITAMIN B-12 CR) 1000 MCG TBCR, Take 1,000 mcg by mouth daily.  , Disp: , Rfl:  ?  FLUoxetine (PROZAC) 40 MG capsule, TAKE 1 CAPSULE BY MOUTH DAILY. (Patient taking differently: Take 40 mg by mouth in the morning.), Disp: 90 capsule, Rfl: 2 ?  furosemide (LASIX) 20 MG tablet, TAKE 1 TABLET (20 MG TOTAL) BY MOUTH DAILY. (Patient taking differently: Take 20 mg by mouth daily as needed for edema.), Disp: 30 tablet, Rfl: 3 ?  gabapentin (NEURONTIN) 300 MG capsule, TAKE 1 CAPSULE BY MOUTH THREE TIMES A DAY (Patient taking differently: Take 300 mg by mouth 3 (three) times daily.), Disp: 90 capsule, Rfl: 11 ?  olmesartan-hydrochlorothiazide (BENICAR HCT) 20-12.5 MG tablet, Take 1 tablet by mouth daily., Disp: 30 tablet, Rfl: 3 ?  pravastatin (PRAVACHOL) 20 MG tablet, Take 1 tablet (20 mg total) by mouth daily., Disp: 30 tablet, Rfl: 1 ?  PRESCRIPTION MEDICATION, CPAP- At bedtime, Disp: , Rfl:  ?  propranolol (INDERAL) 80 MG tablet, TAKE 1 TABLET BY MOUTH 3 TIMES DAILY. (Patient taking differently: Take 80 mg by mouth 3 (three) times daily.), Disp: 270 tablet, Rfl: 3 ?  sodium chloride (OCEAN) 0.65 % SOLN nasal spray, Place 1 spray into both nostrils as needed for congestion., Disp: , Rfl:  ?  tetrahydrozoline 0.05 % ophthalmic solution, Place 1 drop into both eyes 2 (two) times daily as needed (for redness)., Disp: , Rfl:  ?  tiZANidine (ZANAFLEX) 4  MG tablet, TAKE 1 TABLET BY MOUTH EVERY 8 HOURS AS NEEDED FOR MUSCLE SPASMS. (Patient taking differently: Take 4 mg by mouth every 8 (eight) hours as needed for muscle spasms.), Disp: 60 tablet, Rfl: 1 ?  traZODone (DESYREL) 50 MG tablet, TAKE 1 TO 2 TABLETS BY MOUTH AT BEDTIME. (Patient taking differently: Take 100 mg by mouth at bedtime.), Disp: 60 tablet, Rfl: 5 ?  zinc gluconate 50 MG tablet, Take 50 mg by mouth daily., Disp: , Rfl:  ? ? ?Cardiovascular and other pertinent studies: ? ?Mobile cardiac telemetry 13 days 04/25/2021 - 05/09/2021: ?Dominant rhythm: Sinus. ?HR 36-152 bpm. Avg HR 52 bpm. ?52 episodes of atrial tachycardia/SVT, fastest at 135 bpm for 13 beats, longest for 15.9 secs at 100 bpm. ?2.6% isolated SVE, <1% couplet/triplets. ?1 episode of VT, at 105 bpm for 5 beats ?<1% isolated VE, couplet/triplets. ?No atrial fibrillation/atrial flutter/high grade AV block, sinus pause >3sec noted. ?0 patient triggered events.  ?  ?MRI brain 06/08/2021: ?Brain: A 7 mm acute infarct is present in the left paramedian pons. ?A separate, smaller chronic infarct is noted more laterally in the ?pons on the left. T2 hyperintensities in the cerebral white matter ?bilaterally are nonspecific but compatible with minimal chronic ?small vessel ischemic disease, not advanced for age. No intracranial ?hemorrhage, mass, midline shift, or extra-axial fluid collection is ?identified. There is no abnormal intracranial enhancement. There is ?mild generalized cerebral atrophy which is within normal limits for ?age. ?  ?Vascular: Major intracranial vascular flow voids are preserved. ?  ?Skull and upper cervical spine: Unremarkable bone marrow signal. ?  ?Sinuses/Orbits: Bilateral cataract extraction. Clear paranasal ?sinuses. Trace right mastoid fluid. ?  ?Other: None. ?  ?IMPRESSION: ?Small acute and chronic pontine infarcts. ? ?CTA head/neck 06/08/2021: ?1. Negative CTA for large vessel occlusion or other emergent ?finding. ?2.  Moderate atheromatous disease involving the intracranial ?posterior circulation without correctable stenosis. The left PCA is ?severely attenuated and only faintly visible, but remains grossly ?patent to its distal aspect. Predominant fetal type origin of the ?right PCA. ?3. Wide patency of the major arterial vasculature of the neck. ?  ?  ? ?Lexiscan Tetrofosmin stress test 01/31/2021: ?Lexiscan nuclear stress test performed using 1-day protocol. ?SPECT images show decreased tracer uptake in inferior myocardium, more prominent at rest. In absence of myocardial thickening or wall motion abnormality, this likely represents attenuation artifact. Stress LVEF 52%. ?Low risk study. ? ?EKG 01/22/2021: ?Sinus bradycardia 48 bpm  ? ?Echocardiogram 01/15/2021: ? 1. Left ventricular ejection fraction, by estimation, is 55 to 60%. The  ?left ventricle has normal function. The left ventricle has no regional  ?wall motion abnormalities. Left ventricular diastolic parameters are  ?consistent with Grade I diastolic dysfunction (impaired relaxation).  ? 2. Right ventricular systolic function is normal. The right ventricular  ?size is normal. There is moderately elevated pulmonary artery systolic  ?pressure. The estimated right ventricular systolic pressure is 56.3 mmHg.  ?  3. Left atrial size was moderately dilated.  ? 4. The mitral valve is normal in structure. Trivial mitral valve  ?regurgitation. No evidence of mitral stenosis.  ? 5. The aortic valve is normal in structure. Aortic valve regurgitation is  ?trivial. No aortic stenosis is present.  ? 6. Aortic dilatation noted. There is mild dilatation of the ascending  ?aorta, measuring 40 mm.  ? 7. The inferior vena cava is normal in size with greater than 50%  ?respiratory variability, suggesting right atrial pressure of 3 mmHg.  ? ?Comparison(s): Changes from prior study are noted. 05/09/12 EF 55-60%. PA  ?pressure 29mHg.  ? ?Recent labs: ?07/01/2021: ?Glucose 91, BUN/Cr 19/0.88.  EGFR 87. Na/K 141/4.1. Rest of the CMP normal ? ?01/14/2021: ?Glucose 79, BUN/Cr 18/1.01. EGFR >60. Na/K 138/3.9. Rest of the CMP normal ?H/H 15/46. MCV 91. Platelets 174 ? ?2021: ?Chol 178, TG 79, HDL 37, L

## 2021-07-25 ENCOUNTER — Encounter: Payer: Self-pay | Admitting: Cardiology

## 2021-07-28 DIAGNOSIS — G4733 Obstructive sleep apnea (adult) (pediatric): Secondary | ICD-10-CM | POA: Diagnosis not present

## 2021-08-04 ENCOUNTER — Telehealth: Payer: Self-pay

## 2021-08-04 NOTE — Chronic Care Management (AMB) (Signed)
? ? ?Chronic Care Management ?Pharmacy Assistant  ? ?Name: Vernon Bishop  MRN: [id number] DOB: 01-06-1941 ? ? ?Reason for Encounter: Disease State-General  ?  ? ?Recent office visits:  ?06/03/21 [name], [name], MD-PCP (Low back pain with sciatica)  ?CBC ordered; Medication changes: Clotrimazole-Betamethasome 1-0.05% daily ?05/01/21 Ailene Ards, NP [phone]) Medication changes: Molnupiravir 800 mg daily ? ?/Recent consult visits:  ?4//26/23 Nigel Mormon, MD-Cardiology (Cerebrovascular)No orders or med changes ? ?06/25/21 Alda Berthold, DO-Neurology (CVA) No orders; Start home speech exercises,Medication changes: Start pravastatin '20mg'$  daily ? ?06/23/21 Vernell Leep J, MD-Cardiology (AFIB) BMP ordered; Medication changes: Olmesartan Medoxomil-hctz 20-12.5 mg ? ?04/25/21 Patwardhan, Joya Gaskins J, MD-Cardiology (Pulmonary HTN) No orders or medication changes ? ?04/10/21 Parrett, Tammy S, NP-Pulmonary Disease (OsA on cpap) No orders or medication changes ? ?Hospital visits:  ?Medication Reconciliation was completed by comparing discharge summary, patient?s EMR and Pharmacy list, and upon discussion with patient. ? ?Admitted to the hospital on 06/08/21 due to speech difficulty and numbness and tingling of the tongue.Marland Kitchen Discharge date was 06/09/21. [facility]   ? ?Medications Discontinued at Hospital Discharge: ?-Stopped  ?Ondansetron 4 mg ?Polyethylene glycol 17 g ?Testosterone cypionate 200 mg/ml ? ?Medications that remain the same after Hospital Discharge:??  ?-All other medications will remain the same.   ? ?Medications: ?Outpatient Encounter Medications as of 08/04/2021  ?Medication Sig  ? acetaminophen (TYLENOL) 325 MG tablet Take 325-650 mg by mouth every 6 (six) hours as needed for mild pain or headache.  ? amLODipine (NORVASC) 5 MG tablet Take 1 tablet (5 mg total) by mouth daily.  ? apixaban (ELIQUIS) 5 MG TABS tablet Take 1 tablet (5 mg total) by mouth 2 (two) times  daily.  ? Cholecalciferol (VITAMIN D3 PO) Take 1 tablet by mouth daily.  ? clotrimazole-betamethasone (LOTRISONE) cream Apply 1 application. topically 2 (two) times daily. (Patient taking differently: Apply 1 application. topically daily as needed (to affected areas of the face).)  ? Cyanocobalamin (VITAMIN B-12 CR) 1000 MCG TBCR Take 1,000 mcg by mouth daily.    ? FLUoxetine (PROZAC) 40 MG capsule TAKE 1 CAPSULE BY MOUTH DAILY. (Patient taking differently: Take 40 mg by mouth in the morning.)  ? furosemide (LASIX) 20 MG tablet TAKE 1 TABLET (20 MG TOTAL) BY MOUTH DAILY. (Patient taking differently: Take 20 mg by mouth daily as needed for edema.)  ? gabapentin (NEURONTIN) 300 MG capsule TAKE 1 CAPSULE BY MOUTH THREE TIMES A DAY (Patient taking differently: Take 300 mg by mouth 3 (three) times daily.)  ? olmesartan-hydrochlorothiazide (BENICAR HCT) 20-12.5 MG tablet Take 1 tablet by mouth daily.  ? pravastatin (PRAVACHOL) 20 MG tablet Take 1 tablet (20 mg total) by mouth daily.  ? PRESCRIPTION MEDICATION CPAP- At bedtime  ? propranolol (INDERAL) 80 MG tablet TAKE 1 TABLET BY MOUTH 3 TIMES DAILY. (Patient taking differently: Take 80 mg by mouth 3 (three) times daily.)  ? sodium chloride (OCEAN) 0.65 % SOLN nasal spray Place 1 spray into both nostrils as needed for congestion.  ? tetrahydrozoline 0.05 % ophthalmic solution Place 1 drop into both eyes 2 (two) times daily as needed (for redness).  ? tiZANidine (ZANAFLEX) 4 MG tablet TAKE 1 TABLET BY MOUTH EVERY 8 HOURS AS NEEDED FOR MUSCLE SPASMS. (Patient taking differently: Take 4 mg by mouth every 8 (eight) hours as needed for muscle spasms.)  ? traZODone (DESYREL) 50 MG tablet TAKE 1 TO 2 TABLETS BY MOUTH AT BEDTIME. (Patient taking differently:  Take 100 mg by mouth at bedtime.)  ? zinc gluconate 50 MG tablet Take 50 mg by mouth daily.  ? ?No facility-administered encounter medications on file as of 08/04/2021.  ? ?[facility] Review  Call ? ? ?Chart Review: ? ?Have there been any documented new, changed, or discontinued medications since last visit? Yes (If yes, include name, dose, frequency, date)06/03/21 Clotrimazole-Betamethasome 1-0.05% daily, 06/23/21 Olmesartan Medoxomil-hctz 20-12.5 mg, 06/25/21 pravastatin '20mg'$  daily, 05/01/21 Molnupiravir 800 mg daily ?Has there been any documented recent hospitalizations or ED visits since last visit with Clinical Pharmacist? Yes ?Brief Summary (including medication and/or Diagnosis changes):06/08/21 due to speech difficulty and numbness and tingling of the tongue.Marland Kitchen Discharge date was 06/09/21. Discharged from [facility].   ? ? ?Adherence Review: ? ?Does the Clinical Pharmacist Assistant have access to adherence rates? Yes ?Adherence rates for STAR metric medications (List medication(s)/day supply/ last 2 fill dates). ?Adherence rates for medications indicated for disease state being reviewed (List medication(s)/day supply/ last 2 fill dates). ?Does the patient have >5 day gap between last estimated fill dates for any of the above medications or other medication gaps? No ?Reason for medication gaps. ? ? ?Disease State Questions: ? ?Able to connect with Patient? Yes, spoke with wife ?Did patient have any problems with their health recently? No ?Note problems and Concerns: ?Have you had any admissions or emergency room visits or worsening of your condition(s) since last visit? Yes ?Details of ED visit, hospital visit and/or worsening condition(s):Stroke ?Have you had any visits with new specialists or providers since your last visit? Yes ?Explain:Follow up visit ?Have you had any new health care problem(s) since your last visit? No ?New problem(s) reported: ?Have you run out of any of your medications since you last spoke with clinical pharmacist? No ?What caused you to run out of your medications? ?Are there any medications you are not taking as prescribed? No ?What kept you from taking your  medications as prescribed? ?Are you having any issues or side effects with your medications? No ?Note of issues or side effects: ?Do you have any other health concerns or questions you want to discuss with your Clinical Pharmacist before your next visit? No ?Note additional concerns and questions from Patient. ?Are there any health concerns that you feel we can do a better job addressing? No ?Note Patient's response. ?Are you having any problems with any of the following since the last visit: (select all that apply) ? None ? Details: ?12. Any falls since last visit? No ? Details: ?13. Any increased or uncontrolled pain since last visit? Yes ? Details:Back pain ?14. Next visit Type: Telephone ?      Visit with:Clinical Pharmacist ?       Date:10/23/21 ?       Time:1 pm ? ?15. Additional Details? No ?   ?Care Gaps: ?Colonoscopy-NA ?Diabetic Foot Exam-09/20/18 ?Ophthalmology-12/28/16 ?Dexa Scan - NA ?Annual Well Visit - NA ?Micro albumin-12/20/19 ?Hemoglobin A1c- 06/03/21 ? ?Star Rating Drugs: ?Pravastatin 20 mg-last fill 06/25/2021 30 ds ?Olmesartan-hctz 20-12.5 mg-last fill 07/21/21 30 ds ? ?Ethelene Hal ?Clinical Pharmacist Assistant ?[phone]  ?

## 2021-08-26 ENCOUNTER — Other Ambulatory Visit: Payer: Self-pay | Admitting: Neurology

## 2021-08-28 DIAGNOSIS — G4733 Obstructive sleep apnea (adult) (pediatric): Secondary | ICD-10-CM | POA: Diagnosis not present

## 2021-09-03 ENCOUNTER — Encounter: Payer: Self-pay | Admitting: Internal Medicine

## 2021-09-03 ENCOUNTER — Ambulatory Visit (INDEPENDENT_AMBULATORY_CARE_PROVIDER_SITE_OTHER): Payer: PPO | Admitting: Internal Medicine

## 2021-09-03 DIAGNOSIS — M5440 Lumbago with sciatica, unspecified side: Secondary | ICD-10-CM

## 2021-09-03 DIAGNOSIS — I10 Essential (primary) hypertension: Secondary | ICD-10-CM

## 2021-09-03 DIAGNOSIS — Z7901 Long term (current) use of anticoagulants: Secondary | ICD-10-CM

## 2021-09-03 DIAGNOSIS — I272 Pulmonary hypertension, unspecified: Secondary | ICD-10-CM

## 2021-09-03 DIAGNOSIS — I6340 Cerebral infarction due to embolism of unspecified cerebral artery: Secondary | ICD-10-CM

## 2021-09-03 NOTE — Assessment & Plan Note (Signed)
BP Readings from Last 3 Encounters:  09/03/21 126/80  07/23/21 (!) 162/61  06/25/21 (!) 163/74  On Benicar HCT

## 2021-09-03 NOTE — Assessment & Plan Note (Addendum)
Dr Posey Pronto Per Cardiology note (Dr Virgina Jock): Small acute on chronic pontine infarcts discovered in March 2023. Known remote history of A-fib.  Even though cardiac telemetry (2023) did not show A-fib, most likely etiology of his multifocal stroke is paroxysmal A-fib. High CHA2DS2VASc score.  Continue Eliquis 5 mg twice daily. He did not tolerate atorvastatin due to severe fatigue.  We will try pravastatin 20 mg daily.  Patient's stroke was primarily cardioembolic and not ischemic, atherosclerosis.  Therefore, I will I do intensity statin first.  If LDL remains above goal, will then consider Repatha. Patient has moderate left atrial dilatation, grade 1 diastolic dysfunction with preserved LVEF, no right-sided dilation, moderate TR, with estimated RVSP 45 mmHg. On Benicar HCT

## 2021-09-03 NOTE — Progress Notes (Signed)
Subjective:  Patient ID: Vernon Bishop, male    DOB: 14-May-1940  Age: 81 y.o. MRN: [id number]  CC: 3 month f/u (No concerns. )   HPI Vernon Bishop presents for a CVA, ?A fib, anticoagulation, gait issues  Per Cardiology note (Dr Virgina Jock): "Stroke: Small acute on chronic pontine infarcts discovered in March 2023. Known remote history of A-fib.  Even though cardiac telemetry (2023) did not show A-fib, most likely etiology of his multifocal stroke is paroxysmal A-fib. High CHA2DS2VASc score.  Continue Eliquis 5 mg twice daily. He did not tolerate atorvastatin due to severe fatigue.  We will try pravastatin 20 mg daily.  Patient's stroke was primarily cardioembolic and not ischemic, atherosclerosis.  Therefore, I will I do intensity statin first.  If LDL remains above goal, will then consider Repatha. Patient has moderate left atrial dilatation, grade 1 diastolic dysfunction with preserved LVEF, no right-sided dilation, moderate TR, with estimated RVSP 45 mmHg. 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   He is clinically stable and does not need any further cardiac workup at this time. " Outpatient Medications Prior to Visit  Medication Sig Dispense Refill   acetaminophen (TYLENOL) 325 MG tablet Take 325-650 mg by mouth every 6 (six) hours as needed for mild pain or headache.     apixaban (ELIQUIS) 5 MG TABS tablet Take 1 tablet (5 mg total) by mouth 2 (two) times daily. 180 tablet 3   Cholecalciferol (VITAMIN D3 PO) Take 1 tablet by mouth daily.     clotrimazole-betamethasone (LOTRISONE) cream Apply 1 application. topically 2 (two) times daily. (Patient taking differently: Apply 1 application. topically daily as needed (to affected areas of the face).) 45 g 1   Cyanocobalamin (VITAMIN B-12 CR) 1000 MCG TBCR Take 1,000 mcg by  mouth daily.       FLUoxetine (PROZAC) 40 MG capsule TAKE 1 CAPSULE BY MOUTH DAILY. (Patient taking differently: Take 40 mg by mouth in the morning.) 90 capsule 2   furosemide (LASIX) 20 MG tablet TAKE 1 TABLET (20 MG TOTAL) BY MOUTH DAILY. (Patient taking differently: Take 20 mg by mouth daily as needed for edema.) 30 tablet 3   gabapentin (NEURONTIN) 300 MG capsule TAKE 1 CAPSULE BY MOUTH THREE TIMES A DAY (Patient taking differently: Take 300 mg by mouth 3 (three) times daily.) 90 capsule 11   olmesartan-hydrochlorothiazide (BENICAR HCT) 20-12.5 MG tablet Take 1 tablet by mouth daily. 30 tablet 3   pravastatin (PRAVACHOL) 20 MG tablet TAKE 1 TABLET (20 MG TOTAL) BY MOUTH DAILY. 30 tablet 1   PRESCRIPTION MEDICATION CPAP- At bedtime     propranolol (INDERAL) 80 MG tablet TAKE 1 TABLET BY MOUTH 3 TIMES DAILY. (Patient taking differently: Take 80 mg by mouth 3 (three) times daily.) 270 tablet 3   sodium chloride (OCEAN) 0.65 % SOLN nasal spray Place 1 spray into both nostrils as needed for congestion.     tetrahydrozoline 0.05 % ophthalmic solution Place 1 drop into both eyes 2 (two) times daily as needed (for redness).     tiZANidine (ZANAFLEX) 4 MG tablet TAKE 1 TABLET BY MOUTH EVERY 8 HOURS AS NEEDED FOR MUSCLE SPASMS. (Patient taking differently: Take 4 mg by mouth every 8 (eight) hours as needed for muscle spasms.) 60 tablet 1   traZODone (DESYREL) 50 MG tablet TAKE 1 TO 2 TABLETS BY MOUTH  AT BEDTIME. (Patient taking differently: Take 100 mg by mouth at bedtime.) 60 tablet 5   zinc gluconate 50 MG tablet Take 50 mg by mouth daily.     amLODipine (NORVASC) 5 MG tablet Take 1 tablet (5 mg total) by mouth daily. 90 tablet 3   No facility-administered medications prior to visit.    ROS: Review of Systems  Constitutional:  Positive for fatigue. Negative for appetite change and unexpected weight change.  HENT:  Negative for congestion, nosebleeds, sneezing, sore throat and trouble swallowing.    Eyes:  Negative for itching and visual disturbance.  Respiratory:  Negative for cough.   Cardiovascular:  Negative for chest pain, palpitations and leg swelling.  Gastrointestinal:  Negative for abdominal distention, blood in stool, diarrhea and nausea.  Genitourinary:  Negative for frequency and hematuria.  Musculoskeletal:  Positive for back pain and gait problem. Negative for joint swelling and neck pain.  Skin:  Negative for rash.  Neurological:  Positive for weakness. Negative for dizziness, tremors and speech difficulty.  Hematological:  Bruises/bleeds easily.  Psychiatric/Behavioral:  Positive for decreased concentration, dysphoric mood and sleep disturbance. Negative for agitation, confusion and suicidal ideas. The patient is nervous/anxious.    Objective:  BP 126/80   Pulse (!) 55   Temp 97.6 F (36.4 C) (Oral)   Ht 6' (1.829 m)   Wt 209 lb 6.4 oz (95 kg)   SpO2 93%   BMI 28.40 kg/m   BP Readings from Last 3 Encounters:  09/03/21 126/80  07/23/21 (!) 162/61  06/25/21 (!) 163/74    Wt Readings from Last 3 Encounters:  09/03/21 209 lb 6.4 oz (95 kg)  07/23/21 214 lb (97.1 kg)  06/25/21 211 lb (95.7 kg)    Physical Exam Constitutional:      General: He is not in acute distress.    Appearance: He is well-developed. He is obese.     Comments: NAD  Eyes:     Conjunctiva/sclera: Conjunctivae normal.     Pupils: Pupils are equal, round, and reactive to light.  Neck:     Thyroid: No thyromegaly.     Vascular: No JVD.  Cardiovascular:     Rate and Rhythm: Normal rate and regular rhythm.     Heart sounds: Normal heart sounds. No murmur heard.   No friction rub. No gallop.  Pulmonary:     Effort: Pulmonary effort is normal. No respiratory distress.     Breath sounds: Normal breath sounds. No wheezing or rales.  Chest:     Chest wall: No tenderness.  Abdominal:     General: Bowel sounds are normal. There is no distension.     Palpations: Abdomen is soft. There is  no mass.     Tenderness: There is no abdominal tenderness. There is no guarding or rebound.  Musculoskeletal:        General: Tenderness present. Normal range of motion.     Cervical back: Normal range of motion.  Lymphadenopathy:     Cervical: No cervical adenopathy.  Skin:    General: Skin is warm and dry.     Findings: No rash.  Neurological:     Mental Status: He is alert and oriented to person, place, and time.     Cranial Nerves: No cranial nerve deficit.     Motor: No abnormal muscle tone.     Coordination: Coordination abnormal.     Gait: Gait abnormal.     Deep Tendon Reflexes: Reflexes are normal and symmetric.  Psychiatric:        Behavior: Behavior normal.        Thought Content: Thought content normal.        Judgment: Judgment normal.  Antalgic ataxic gait Using a acane  Lab Results  Component Value Date   WBC 6.1 06/08/2021   HGB 13.9 06/08/2021   HCT 41.0 06/08/2021   PLT 199 06/08/2021   GLUCOSE 91 07/01/2021   CHOL 157 06/09/2021   TRIG 99 06/09/2021   HDL 33 (L) 06/09/2021   LDLCALC 104 (H) 06/09/2021   ALT 19 06/08/2021   AST 23 06/08/2021   NA 141 07/01/2021   K 4.1 07/01/2021   CL 101 07/01/2021   CREATININE 0.88 07/01/2021   BUN 19 07/01/2021   CO2 24 07/01/2021   TSH 2.18 06/03/2021   PSA 0.73 12/20/2019   INR 1.1 06/08/2021   HGBA1C 5.0 06/09/2021   MICROALBUR 3.9 12/20/2019    CT ANGIO HEAD NECK W WO CM  Result Date: 06/08/2021 CLINICAL DATA:  Follow-up examination for acute stroke. EXAM: CT ANGIOGRAPHY HEAD AND NECK TECHNIQUE: Multidetector CT imaging of the head and neck was performed using the standard protocol during bolus administration of intravenous contrast. Multiplanar CT image reconstructions and MIPs were obtained to evaluate the vascular anatomy. Carotid stenosis measurements (when applicable) are obtained utilizing NASCET criteria, using the distal internal carotid diameter as the denominator. RADIATION DOSE REDUCTION: This  exam was performed according to the departmental dose-optimization program which includes automated exposure control, adjustment of the mA and/or kV according to patient size and/or use of iterative reconstruction technique. CONTRAST:  120m OMNIPAQUE IOHEXOL 350 MG/ML SOLN COMPARISON:  Prior MRI from earlier the same day. FINDINGS: CT HEAD FINDINGS Brain: Cerebral volume within normal limits. Previously identified small acute and chronic pontine infarcts noted, better evaluated on prior MRI. No acute intracranial hemorrhage. No other acute large vessel territory infarct. No mass lesion or midline shift. No hydrocephalus or extra-axial fluid collection. Vascular: No hyperdense vessel. Scattered vascular calcifications noted within the carotid siphons. Skull: Scalp soft tissues and calvarium within normal limits. Sinuses: Mild mucosal thickening noted within the ethmoidal air cells. Paranasal sinuses and mastoid air cells are otherwise clear. Orbits: Globes and orbital soft tissues demonstrate no acute finding. Review of the MIP images confirms the above findings CTA NECK FINDINGS Aortic arch: Visualized aortic arch normal caliber with normal branch pattern. No stenosis about the origin of the great vessels. Right carotid system: Right common and internal carotid arteries widely patent without stenosis, dissection or occlusion. Left carotid system: Left common and internal carotid arteries widely patent without stenosis, dissection or occlusion. Vertebral arteries: Both vertebral arteries arise from the subclavian arteries. No proximal subclavian artery stenosis. Both vertebral arteries widely patent without stenosis, dissection or occlusion. Skeleton: No discrete or worrisome osseous lesions. Prior ACDF at C3 through C7. 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 Extensive bulky osteophytic spurring within the visualized upper thoracic spine, suggesting DISH. Other neck: No other acute soft tissue abnormality within the  neck. Upper chest: Visualized upper chest demonstrates no acute finding. Review of the MIP images confirms the above findings CTA HEAD FINDINGS Anterior circulation: Both internal carotid arteries patent to the termini without stenosis or other abnormality. Left A1 widely patent. Right A1 hypoplastic and/or absent, accounting for the diminutive right ICA is compared to the left. Normal anterior communicating artery complex. Anterior cerebral arteries patent to their distal aspects without significant stenosis. Normal in stenosis or occlusion. Normal  MCA bifurcations. Distal MCA branches perfused and symmetric. Posterior circulation: Both V4 segments patent to the vertebrobasilar junction without significant stenosis. Left vertebral artery dominant. Left PICA patent. Right PICA not seen. Moderate atheromatous irregularity within the basilar artery without high-grade stenosis. Superior cerebellar arteries patent bilaterally. Predominant fetal type origin of the right PCA supplied via a robust right posterior communicating artery. Tiny 2 mm outpouching at the proximal right P2 segment favored to reflect a vascular infundibulum related to a hypoplastic right P1 segment. Moderate atheromatous irregularity throughout the right PCA without high-grade stenosis. The contralateral left PCA is primarily supplied via the basilar and is severely attenuated and only faintly visible. Left PCA appears to remain grossly patent to its distal aspect. Venous sinuses: Patent allowing for timing the contrast bolus. Anatomic variants: Predominant fetal type origin of the right PCA. Hypoplastic/absent right A1 with the ACAs supplied via the left carotid artery system. No aneurysm. Review of the MIP images confirms the above findings IMPRESSION: 1. Negative CTA for large vessel occlusion or other emergent finding. 2. Moderate atheromatous disease involving the intracranial posterior circulation without correctable stenosis. The left PCA is  severely attenuated and only faintly visible, but remains grossly patent to its distal aspect. Predominant fetal type origin of the right PCA. 3. Wide patency of the major arterial vasculature of the neck. Electronically Signed   By: Jeannine Boga M.D.   On: 06/08/2021 21:25   MR BRAIN W WO CONTRAST  Result Date: 06/08/2021 CLINICAL DATA:  Tongue paralysis/weakness (CN 12).  Dysarthria. EXAM: MRI HEAD WITHOUT AND WITH CONTRAST TECHNIQUE: Multiplanar, multiecho pulse sequences of the brain and surrounding structures were obtained without and with intravenous contrast. CONTRAST:  9.52m GADAVIST GADOBUTROL 1 MMOL/ML IV SOLN COMPARISON:  Head CT 02/27/2005 FINDINGS: Brain: A 7 mm acute infarct is present in the left paramedian pons. A separate, smaller chronic infarct is noted more laterally in the pons on the left. T2 hyperintensities in the cerebral white matter bilaterally are nonspecific but compatible with minimal chronic small vessel ischemic disease, not advanced for age. No intracranial hemorrhage, mass, midline shift, or extra-axial fluid collection is identified. There is no abnormal intracranial enhancement. There is mild generalized cerebral atrophy which is within normal limits for age. Vascular: Major intracranial vascular flow voids are preserved. Skull and upper cervical spine: Unremarkable bone marrow signal. Sinuses/Orbits: Bilateral cataract extraction. Clear paranasal sinuses. Trace right mastoid fluid. Other: None. IMPRESSION: Small acute and chronic pontine infarcts. Electronically Signed   By: ALogan BoresM.D.   On: 06/08/2021 18:38   ECHOCARDIOGRAM COMPLETE BUBBLE STUDY  Result Date: 06/09/2021    ECHOCARDIOGRAM REPORT   Patient Name:   TGRADEN HOSHINODate of Exam: 06/09/2021 Medical Rec #:  [id number]      Height:       72.0 in Accession #:    [id number]     Weight:       212.0 lb Date of Birth:  918-Jun-1942       BSA:          2.184 m Patient Age:    89years        BP:            135/55 mmHg Patient Gender: M               HR:           46 bpm. Exam Location:  Inpatient Procedure: 2D Echo, Color Doppler and Saline Contrast Bubble  Study Indications:    I63.9 / 434.91 STROKE  History:        Patient has prior history of Echocardiogram examinations, most                 recent 01/15/2021. Arrythmias:Atrial Fibrillation and                 Bradycardia; Risk Factors:Diabetes, Hypertension and                 Dyslipidemia. DYSRHYTHMIA / MVP.  Sonographer:    Beryle Beams Referring Phys: [id number] [place] T TU IMPRESSIONS  1. Left ventricular ejection fraction, by estimation, is 55 to 60%. The left ventricle has normal function. The left ventricle has no regional wall motion abnormalities. There is mild concentric left ventricular hypertrophy. Left ventricular diastolic parameters are consistent with Grade II diastolic dysfunction (pseudonormalization).  2. Right ventricular systolic function is normal. The right ventricular size is normal. There is normal pulmonary artery systolic pressure. The estimated right ventricular systolic pressure is 63.0 mmHg.  3. Left atrial size was severely dilated.  4. Right atrial size was mildly dilated.  5. The mitral valve is normal in structure. Trivial mitral valve regurgitation. No evidence of mitral stenosis.  6. The aortic valve is tricuspid. Aortic valve regurgitation is trivial. Aortic valve sclerosis is present, with no evidence of aortic valve stenosis. Aortic valve area, by VTI measures 2.27 cm. Aortic valve mean gradient measures 5.0 mmHg. Aortic valve Vmax measures 1.62 m/s.  7. The inferior vena cava is dilated in size with >50% respiratory variability, suggesting right atrial pressure of 8 mmHg. FINDINGS  Left Ventricle: Left ventricular ejection fraction, by estimation, is 55 to 60%. The left ventricle has normal function. The left ventricle has no regional wall motion abnormalities. The left ventricular internal cavity size was normal in size.  There is  mild concentric left ventricular hypertrophy. Left ventricular diastolic parameters are consistent with Grade II diastolic dysfunction (pseudonormalization). Normal left ventricular filling pressure. Right Ventricle: The right ventricular size is normal. No increase in right ventricular wall thickness. Right ventricular systolic function is normal. There is normal pulmonary artery systolic pressure. The tricuspid regurgitant velocity is 2.48 m/s, and  with an assumed right atrial pressure of 8 mmHg, the estimated right ventricular systolic pressure is 16.0 mmHg. Left Atrium: Left atrial size was severely dilated. Right Atrium: Right atrial size was mildly dilated. Pericardium: There is no evidence of pericardial effusion. Mitral Valve: The mitral valve is normal in structure. Trivial mitral valve regurgitation. No evidence of mitral valve stenosis. Tricuspid Valve: The tricuspid valve is normal in structure. Tricuspid valve regurgitation is trivial. No evidence of tricuspid stenosis. Aortic Valve: The aortic valve is tricuspid. Aortic valve regurgitation is trivial. Aortic valve sclerosis is present, with no evidence of aortic valve stenosis. Aortic valve mean gradient measures 5.0 mmHg. Aortic valve peak gradient measures 10.5 mmHg.  Aortic valve area, by VTI measures 2.27 cm. Pulmonic Valve: The pulmonic valve was normal in structure. Pulmonic valve regurgitation is trivial. No evidence of pulmonic stenosis. Aorta: The aortic root is normal in size and structure. Venous: The inferior vena cava is dilated in size with greater than 50% respiratory variability, suggesting right atrial pressure of 8 mmHg. IAS/Shunts: No atrial level shunt detected by color flow Doppler. Agitated saline contrast was given intravenously to evaluate for intracardiac shunting.  LEFT VENTRICLE PLAX 2D LVIDd:         5.60 cm  Diastology LVIDs:         3.20 cm      LV e' medial:    5.44 cm/s LV PW:         1.20 cm      LV E/e'  medial:  13.4 LV IVS:        1.40 cm      LV e' lateral:   6.84 cm/s LVOT diam:     2.00 cm      LV E/e' lateral: 10.6 LV SV:         92 LV SV Index:   42 LVOT Area:     3.14 cm  LV Volumes (MOD) LV vol d, MOD A2C: 106.0 ml LV vol d, MOD A4C: 111.0 ml LV vol s, MOD A2C: 42.5 ml LV vol s, MOD A4C: 49.6 ml LV SV MOD A2C:     63.5 ml LV SV MOD A4C:     111.0 ml LV SV MOD BP:      64.5 ml RIGHT VENTRICLE             IVC RV S prime:     21.90 cm/s  IVC diam: 2.10 cm TAPSE (M-mode): 3.6 cm LEFT ATRIUM              Index        RIGHT ATRIUM           Index LA diam:        4.20 cm  1.92 cm/m   RA Area:     24.70 cm LA Vol (A2C):   105.0 ml 48.08 ml/m  RA Volume:   80.90 ml  37.04 ml/m LA Vol (A4C):   97.2 ml  44.51 ml/m LA Biplane Vol: 104.0 ml 47.62 ml/m  AORTIC VALVE                     PULMONIC VALVE AV Area (Vmax):    2.25 cm      PV Vmax:       0.55 m/s AV Area (Vmean):   2.13 cm      PV Vmean:      34.000 cm/s AV Area (VTI):     2.27 cm      PV VTI:        0.130 m AV Vmax:           162.00 cm/s   PV Peak grad:  1.2 mmHg AV Vmean:          105.000 cm/s  PV Mean grad:  1.0 mmHg AV VTI:            0.407 m AV Peak Grad:      10.5 mmHg AV Mean Grad:      5.0 mmHg LVOT Vmax:         116.00 cm/s LVOT Vmean:        71.100 cm/s LVOT VTI:          0.294 m LVOT/AV VTI ratio: 0.72  AORTA Ao Root diam: 2.80 cm Ao Asc diam:  3.50 cm MITRAL VALVE               TRICUSPID VALVE MV Area (PHT): 3.53 cm    TR Peak grad:   24.6 mmHg MV Decel Time: 215 msec    TR Mean grad:   16.0 mmHg MV E velocity: 72.80 cm/s  TR Vmax:        248.00 cm/s MV A velocity: 80.60 cm/s  TR Vmean:       190.0 cm/s MV E/A ratio:  0.90                            SHUNTS                            Systemic VTI:  0.29 m                            Systemic Diam: 2.00 cm Fransico Him MD Electronically signed by Fransico Him MD Signature Date/Time: 06/09/2021/3:22:21 PM    Final     Assessment & Plan:   Problem List Items Addressed This Visit      Anticoagulant long-term use    Cont on Eliquis       Cerebrovascular accident (CVA) [facility])    Dr Posey Pronto Per Cardiology note (Dr Virgina Jock): Small acute on chronic pontine infarcts discovered in March 2023. Known remote history of A-fib.  Even though cardiac telemetry (2023) did not show A-fib, most likely etiology of his multifocal stroke is paroxysmal A-fib. High CHA2DS2VASc score.  Continue Eliquis 5 mg twice daily. He did not tolerate atorvastatin due to severe fatigue.  We will try pravastatin 20 mg daily.  Patient's stroke was primarily cardioembolic and not ischemic, atherosclerosis.  Therefore, I will I do intensity statin first.  If LDL remains above goal, will then consider Repatha. Patient has moderate left atrial dilatation, grade 1 diastolic dysfunction with preserved LVEF, no right-sided dilation, moderate TR, with estimated RVSP 45 mmHg. On Benicar HCT      HTN (hypertension)    BP Readings from Last 3 Encounters:  09/03/21 126/80  07/23/21 (!) 162/61  06/25/21 (!) 163/74  On Benicar HCT       LOW BACK PAIN    Better after his most recent surgery -- pain meds per Dr Nelva Bush. In fact, he is on Tylenol prn only!       Pulmonary hypertension, unspecified ([place])    Cont on Rx Per Cardiology note (Dr Virgina Jock): Patient has moderate left atrial dilatation, grade 1 diastolic dysfunction with preserved LVEF, no right-sided dilation, moderate TR, with estimated RVSP 45 mmHg. On Benicar HCT         No orders of the defined types were placed in this encounter.     Follow-up: Return in about 3 months (around 12/04/2021) for a follow-up visit.  Walker Kehr, MD

## 2021-09-03 NOTE — Assessment & Plan Note (Addendum)
Cont on Rx Per Cardiology note (Dr Virgina Jock): Patient has moderate left atrial dilatation, grade 1 diastolic dysfunction with preserved LVEF, no right-sided dilation, moderate TR, with estimated RVSP 45 mmHg. On Benicar HCT

## 2021-09-03 NOTE — Assessment & Plan Note (Signed)
Better after his most recent surgery -- pain meds per Dr Nelva Bush. In fact, he is on Tylenol prn only!

## 2021-09-03 NOTE — Assessment & Plan Note (Signed)
Cont on Eliquis- 

## 2021-09-18 ENCOUNTER — Other Ambulatory Visit: Payer: Self-pay | Admitting: Internal Medicine

## 2021-09-27 DIAGNOSIS — G4733 Obstructive sleep apnea (adult) (pediatric): Secondary | ICD-10-CM | POA: Diagnosis not present

## 2021-10-21 ENCOUNTER — Other Ambulatory Visit: Payer: Self-pay | Admitting: Cardiology

## 2021-10-21 DIAGNOSIS — I10 Essential (primary) hypertension: Secondary | ICD-10-CM

## 2021-10-23 ENCOUNTER — Telehealth: Payer: PPO

## 2021-10-28 DIAGNOSIS — D6869 Other thrombophilia: Secondary | ICD-10-CM | POA: Diagnosis not present

## 2021-10-28 DIAGNOSIS — G4733 Obstructive sleep apnea (adult) (pediatric): Secondary | ICD-10-CM | POA: Diagnosis not present

## 2021-10-28 DIAGNOSIS — Z8673 Personal history of transient ischemic attack (TIA), and cerebral infarction without residual deficits: Secondary | ICD-10-CM | POA: Diagnosis not present

## 2021-10-28 DIAGNOSIS — M5414 Radiculopathy, thoracic region: Secondary | ICD-10-CM | POA: Diagnosis not present

## 2021-11-03 ENCOUNTER — Telehealth: Payer: Self-pay | Admitting: Internal Medicine

## 2021-11-03 NOTE — Telephone Encounter (Signed)
Surgical clearance paperwork from [facility] is in Dr. Judeen Hammans box up front.

## 2021-11-04 NOTE — Telephone Encounter (Signed)
Faxed form back to [facility] @ [phone].Marland KitchenJohny Chess

## 2021-11-18 ENCOUNTER — Other Ambulatory Visit: Payer: Self-pay | Admitting: Neurology

## 2021-11-18 DIAGNOSIS — M5414 Radiculopathy, thoracic region: Secondary | ICD-10-CM | POA: Diagnosis not present

## 2021-11-24 ENCOUNTER — Ambulatory Visit (INDEPENDENT_AMBULATORY_CARE_PROVIDER_SITE_OTHER): Payer: PPO

## 2021-11-24 VITALS — Ht 72.0 in | Wt 209.0 lb

## 2021-11-24 DIAGNOSIS — Z0000 Encounter for general adult medical examination without abnormal findings: Secondary | ICD-10-CM | POA: Diagnosis not present

## 2021-11-24 NOTE — Progress Notes (Addendum)
Subjective:   Vernon Bishop is a 81 y.o. male who presents for Medicare Annual/Subsequent preventive examination.   I connected with [name]  today by telephone and verified that I am speaking with the correct person using two identifiers. Location patient: home Location provider: work Persons participating in the virtual visit: patient, provider.   I discussed the limitations, risks, security and privacy concerns of performing an evaluation and management service by telephone and the availability of in person appointments. I also discussed with the patient that there may be a patient responsible charge related to this service. The patient expressed understanding and verbally consented to this telephonic visit.    Interactive audio and video telecommunications were attempted between this provider and patient, however failed, due to patient having technical difficulties OR patient did not have access to video capability.  We continued and completed visit with audio only.    Review of Systems     Cardiac Risk Factors include: advanced age (>41mn, >>29women);hypertension     Objective:    Today's Vitals   11/24/21 0902  Weight: 209 lb (94.8 kg)  Height: 6' (1.829 m)   Body mass index is 28.35 kg/m.     11/24/2021    9:17 AM 06/25/2021   10:00 AM 06/11/2021    8:57 AM 06/08/2021    9:56 PM 06/08/2021    1:14 PM 03/12/2021   12:15 PM 03/10/2021   11:20 AM  Advanced Directives  Does Patient Have a Medical Advance Directive? Yes Yes Yes Yes Yes No No  Type of AParamedicof AG[place]coeLiving will [place]Living will;Out of facility DNR (pink MOST or yellow form) HEconomyLiving will HA[place]arokeeLiving will HWatersmeetLiving will    Does patient want to make changes to medical advance directive?   No - Patient declined No - Patient declined     Copy of [place] Chart? No - copy requested  No - copy requested No - copy requested     Would patient like information on creating a medical advance directive?      No - Patient declined No - Patient declined    Current Medications (verified) Outpatient Encounter Medications as of 11/24/2021  Medication Sig   acetaminophen (TYLENOL) 325 MG tablet Take 325-650 mg by mouth every 6 (six) hours as needed for mild pain or headache.   amLODipine (NORVASC) 5 MG tablet TAKE 1 TABLET BY MOUTH DAILY.   apixaban (ELIQUIS) 5 MG TABS tablet Take 1 tablet (5 mg total) by mouth 2 (two) times daily.   Cholecalciferol (VITAMIN D3 PO) Take 1 tablet by mouth daily.   clotrimazole-betamethasone (LOTRISONE) cream Apply 1 application. topically 2 (two) times daily. (Patient taking differently: Apply 1 application  topically daily as needed (to affected areas of the face).)   Cyanocobalamin (VITAMIN B-12 CR) 1000 MCG TBCR Take 1,000 mcg by mouth daily.     FLUoxetine (PROZAC) 40 MG capsule TAKE 1 CAPSULE BY MOUTH DAILY. (Patient taking differently: Take 40 mg by mouth in the morning.)   furosemide (LASIX) 20 MG tablet TAKE 1 TABLET (20 MG TOTAL) BY MOUTH DAILY. (Patient taking differently: Take 20 mg by mouth daily as needed for edema.)   gabapentin (NEURONTIN) 300 MG capsule TAKE 1 CAPSULE BY MOUTH THREE TIMES A DAY (Patient taking differently: Take 300 mg by mouth 3 (three) times daily.)   olmesartan-hydrochlorothiazide (BENICAR HCT) 20-12.5 MG tablet  TAKE 1 TABLET BY MOUTH DAILY.   pravastatin (PRAVACHOL) 20 MG tablet TAKE 1 TABLET (20 MG TOTAL) BY MOUTH DAILY.   PRESCRIPTION MEDICATION CPAP- At bedtime   propranolol (INDERAL) 80 MG tablet TAKE 1 TABLET BY MOUTH 3 TIMES DAILY. (Patient taking differently: Take 80 mg by mouth 3 (three) times daily.)   sodium chloride (OCEAN) 0.65 % SOLN nasal spray Place 1 spray into both nostrils as needed for congestion.   tetrahydrozoline 0.05 % ophthalmic solution Place 1 drop into  both eyes 2 (two) times daily as needed (for redness).   tiZANidine (ZANAFLEX) 4 MG tablet TAKE 1 TABLET BY MOUTH EVERY 8 HOURS AS NEEDED FOR MUSCLE SPASMS. (Patient taking differently: Take 4 mg by mouth every 8 (eight) hours as needed for muscle spasms.)   traZODone (DESYREL) 50 MG tablet TAKE 1 TO 2 TABLETS BY MOUTH AT BEDTIME. (Patient taking differently: Take 100 mg by mouth at bedtime.)   zinc gluconate 50 MG tablet Take 50 mg by mouth daily.   No facility-administered encounter medications on file as of 11/24/2021.    Allergies (verified) Abilify [aripiprazole], Cymbalta [duloxetine hcl], Digoxin and related, Prednisolone, Temazepam, and Other   History: Past Medical History:  Diagnosis Date   Arthralgia    Atrial fibrillation ([place])    1992   Benign prostatic hyperplasia    Bradycardia    "my doctor says I have bradycardia, my heart rate is always in the 40s"   DDD (degenerative disc disease)    Depression    Diabetes mellitus    type II  - PT STATES NOT ON ANY DIABETIC MEDICINES   Dysrhythmia    aFIB   Dyssomnia    ED (erectile dysfunction)    Hyperlipidemia    Hypertension    Hypogonadism male    Joint pain    LBP (low back pain)    Multiple actinic keratoses    Muscle weakness    Upper limb   MVP (mitral valve prolapse)    Neck pain    OSA on CPAP    cpap   Osteoarthritis of hip    Osteoarthritis, hand    SEVERE LOWER BACK PAIN, AND RESTLESS LEG SYNDROME   Otalgia of right ear    Pain in left knee    Pain in thoracic spine    Palpitations    PTSD (post-traumatic stress disorder)    Shoulder pain    Subjective visual disturbance    Swallowing problem    HX OF PILLS GETTING "STUCK" IN THROAT AT TIMES   Swelling    Type 2 diabetes mellitus ([place])    pt states he has never had it   Vitamin D deficiency    Past Surgical History:  Procedure Laterality Date   ABDOMINAL EXPOSURE N/A 03/12/2021   Procedure: ABDOMINAL EXPOSURE;  Surgeon: Marty Heck,  MD;  Location: [facility];  Service: Vascular;  Laterality: N/A;   ANTERIOR LAT LUMBAR FUSION N/A 03/12/2021   Procedure: ANTERIOR LATERAL LUMBAR FUSION 2 LEVELS;  Surgeon: Melina Schools, MD;  Location: [facility];  Service: Orthopedics;  Laterality: N/A;   APPENDECTOMY     BACK SURGERY  1987   CATARACT EXTRACTION     CERVICAL LAMINECTOMY  2004   [name], rod was placed- HAS ROM LIMITATIONS   CHOLECYSTECTOMY     COLONOSCOPY     JOINT REPLACEMENT     KNEE SURGERY     left knee/arthroscopic   TOTAL HIP ARTHROPLASTY Right 03/14/2013  Procedure: RIGHT TOTAL HIP ARTHROPLASTY ANTERIOR APPROACH;  Surgeon: [name], MD;  Location: [facility];  Service: Orthopedics;  Laterality: Right;   TOTAL HIP ARTHROPLASTY Left 05/23/2019   Procedure: TOTAL HIP ARTHROPLASTY ANTERIOR APPROACH;  Surgeon: Paralee Cancel, MD;  Location: [facility];  Service: Orthopedics;  Laterality: Left;  70 mins   Family History  Problem Relation Age of Onset   Allergies Mother    Stroke Mother        Clotting disorders   Heart disease Mother    Heart disease Father    Heart attack Father 52   Heart disease Brother    Rheum arthritis Paternal Grandmother    Other Paternal Grandmother        Rheumatism   Coronary artery disease Other    Diabetes Other    Hypertension Other    Esophageal cancer Maternal Aunt    Colon cancer Neg Hx    Rectal cancer Neg Hx    Stomach cancer Neg Hx    Social History   Socioeconomic History   Marital status: Married    Spouse name: Not on file   Number of children: 4   Years of education: Not on file   Highest education level: Not on file  Occupational History   Occupation: Retired-    Fish farm manager: RETIRED    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  Tobacco Use   Smoking status: Former    Packs/day: 0.50    Years: 12.00    Total pack years: 6.00    Types: Cigarettes    Quit date: 03/31/1971    Years since quitting: 50.6   Smokeless tobacco: Never   Tobacco comments:    Started at age 25  Vaping Use    Vaping Use: Never used  Substance and Sexual Activity   Alcohol use: Yes    Alcohol/week: 2.0 standard drinks of alcohol    Types: 2 Cans of beer per week    Comment: occasionally   Drug use: No   Sexual activity: Yes  Other Topics Concern   Not on file  Social History Narrative   FAMILY HISTORYHistory of CAD Male 1st degree relative <50History Diabetes 1st degree relativeHistory hypertension      Right Handed    Lives in a one story home    Social Determinants of Health   Financial Resource Strain: Low Risk  (11/24/2021)   Overall Financial Resource Strain (CARDIA)    Difficulty of Paying Living Expenses: Not hard at all  Food Insecurity: No Food Insecurity (11/24/2021)   Hunger Vital Sign    Worried About Running Out of Food in the Last Year: Never true    Ran Out of Food in the Last Year: Never true  Transportation Needs: No Transportation Needs (11/24/2021)   PRAPARE - Hydrologist (Medical): No    Lack of Transportation (Non-Medical): No  Physical Activity: Insufficiently Active (11/24/2021)   Exercise Vital Sign    Days of Exercise per Week: 1 day    Minutes of Exercise per Session: 10 min  Stress: No Stress Concern Present (11/24/2021)   [place]    Feeling of Stress : Not at all  Social Connections: Moderately Isolated (11/24/2021)   Social Connection and Isolation Panel [NHANES]    Frequency of Communication with Friends and Family: More than three times a week    Frequency of Social Gatherings with Friends and Family: More than three times a  week    Attends Religious Services: Never    Active Member of Clubs or Organizations: No    Attends Music therapist: Never    Marital Status: Married    Tobacco Counseling Counseling given: No Tobacco comments: Started at age 16   Clinical Intake:  Pre-visit preparation completed: Yes  Pain : No/denies pain      Diabetes: No  How often do you need to have someone help you when you read instructions, pamphlets, or other written materials from your doctor or pharmacy?: 1 - Never What is the last grade level you completed in school?: GED  Diabetic?no  Interpreter Needed?: No  Information entered by :: L.Wilson,LPN   Activities of Daily Living    11/24/2021    9:11 AM 06/08/2021   10:02 PM  In your present state of health, do you have any difficulty performing the following activities:  Hearing? 0   Vision? 0   Difficulty concentrating or making decisions? 0   Walking or climbing stairs? 0   Dressing or bathing? 0   Doing errands, shopping? 0 0  Preparing Food and eating ? N   Using the Toilet? N   In the past six months, have you accidently leaked urine? N   Do you have problems with loss of bowel control? N   Managing your Medications? N   Managing your Finances? N   Housekeeping or managing your Housekeeping? N     Patient Care Team: [name], [name], MD as PCP - General (Internal Medicine) Paralee Cancel, MD as Consulting Physician (Orthopedic Surgery) Norma Fredrickson, MD as Consulting Physician (Psychiatry) Melina Schools, MD as Consulting Physician (Orthopedic Surgery) Szabat, Darnelle Maffucci, [facility] (Inactive) as Pharmacist (Pharmacist) [facility] [place] as Consulting Physician (Ophthalmology) Nigel Mormon, MD as Consulting Physician (Cardiology) Alda Berthold, DO as Consulting Physician (Neurology)  Indicate any recent Medical Services you may have received from other than [facility] providers in the past year (date may be approximate).     Assessment:   This is a routine wellness examination for [facility].  Hearing/Vision screen Vision Screening - Comments:: Due annual eye exams , Patient will schedule with Dr.Sum   Dietary issues and exercise activities discussed:     Goals Addressed             This Visit's Progress    Exercise 150 minutes  per week (moderate activity)   Not on track    Thinking about joining the [facility] and considering pool exercise; Suggested stretching exercise      Patient Stated   On track    Increase my physical activity by stretching.       Depression Screen    11/24/2021    9:09 AM 09/03/2021    2:14 PM 06/03/2021    2:07 PM 05/01/2021    1:38 PM 12/09/2020    3:00 PM 11/14/2020    3:21 PM 09/20/2018    4:51 PM  PHQ 2/9 Scores  PHQ - 2 Score 0 4 0  0 0 2  PHQ- 9 Score  7 0    7  Exception Documentation    Other- indicate reason in comment box     Not completed    virtual visit       Fall Risk    11/24/2021    9:05 AM 09/03/2021    2:15 PM 06/25/2021   10:00 AM 11/14/2020    3:05 PM 08/30/2020    2:30 PM  Fall Risk   Falls in the past year? 0 0 0 1 1  Number falls in past yr: 0 0 0 0 0  Injury with Fall? 0 0 0 0 0  Risk for fall due to : No Fall Risks   No Fall Risks;Impaired balance/gait No Fall Risks  Risk for fall due to: Comment uses Cane      Follow up Falls prevention discussed    Falls evaluation completed    [place]:  Any stairs in or around the home? No  If so, are there any without handrails? No  Home free of loose throw rugs in walkways, pet beds, electrical cords, etc? Yes  Adequate lighting in your home to reduce risk of falls? Yes   ASSISTIVE DEVICES UTILIZED TO PREVENT FALLS:  Life alert? No  Use of a cane, walker or w/c? Yes  Grab bars in the bathroom? Yes  Shower chair or bench in shower? No  Elevated toilet seat or a handicapped toilet? No       04/02/2017    4:01 PM  MMSE - Mini Mental State Exam  Not completed: Unable to complete  Orientation to time 5  Orientation to Place 5        11/24/2021    9:14 AM  6CIT Screen  What Year? 0 points  What month? 0 points  What time? 0 points  Count back from 20 0 points  Months in reverse 0 points  Repeat phrase 0 points  Total Score 0 points    Immunizations Immunization  History  Administered Date(s) Administered   Fluad Quad(high Dose 65+) 11/22/2018, 12/20/2019   Influenza Split 01/28/2011, 12/18/2011   Influenza Whole 02/14/2007, 12/11/2009   Influenza, High Dose Seasonal PF 02/03/2016, 12/30/2016, 01/04/2018, 12/09/2020   Influenza,inj,Quad PF,6+ Mos 01/03/2013, 01/10/2014, 12/05/2014   PFIZER(Purple Top)SARS-COV-2 Vaccination 04/19/2019, 05/10/2019, 12/24/2019, 07/05/2020   PNEUMOCOCCAL CONJUGATE-20 08/30/2020   Pfizer Covid-19 Vaccine Bivalent Booster 9yr & up 12/09/2020   Pneumococcal Conjugate-13 05/08/2013   Pneumococcal Polysaccharide-23 08/27/2009   Td 03/30/2008   Zoster, Live 04/13/2014    TDAP status: Due, Education has been provided regarding the importance of this vaccine. Advised may receive this vaccine at local pharmacy or Health Dept. Aware to provide a copy of the vaccination record if obtained from local pharmacy or Health Dept. Verbalized acceptance and understanding.  Flu Vaccine status: Up to date  Pneumococcal vaccine status: Up to date  Covid-19 vaccine status: Completed vaccines  Qualifies for Shingles Vaccine? Yes   Zostavax completed No   Shingrix Completed?: No.    Education has been provided regarding the importance of this vaccine. Patient has been advised to call insurance company to determine out of pocket expense if they have not yet received this vaccine. Advised may also receive vaccine at local pharmacy or Health Dept. Verbalized acceptance and understanding.  Screening Tests Health Maintenance  Topic Date Due   Zoster Vaccines- Shingrix (1 of 2) Never done   OPHTHALMOLOGY EXAM  12/28/2017   TETANUS/TDAP  03/30/2018   FOOT EXAM  09/20/2019   Diabetic kidney evaluation - Urine ACR  12/19/2020   COVID-19 Vaccine (6 - Pfizer risk series) 02/03/2021   INFLUENZA VACCINE  10/28/2021   HEMOGLOBIN A1C  12/10/2021   Diabetic kidney evaluation - GFR measurement  07/02/2022   Pneumonia Vaccine 81 Years old   Completed   HPV VACCINES  Aged Out    Health Maintenance  Health Maintenance Due  Topic Date Due   Zoster Vaccines- Shingrix (1 of 2) Never done   OPHTHALMOLOGY EXAM  12/28/2017   TETANUS/TDAP  03/30/2018   FOOT EXAM  09/20/2019   Diabetic kidney evaluation - Urine ACR  12/19/2020   COVID-19 Vaccine (6 - Pfizer risk series) 02/03/2021   INFLUENZA VACCINE  10/28/2021    Colorectal cancer screening: No longer required.   Lung Cancer Screening: (Low Dose CT Chest recommended if Age 51-80 years, 30 pack-year currently smoking OR have quit w/in 15years.) does not qualify.   Lung Cancer Screening Referral: n/a  Additional Screening:  Hepatitis C Screening: does not qualify;   Vision Screening: Recommended annual ophthalmology exams for early detection of glaucoma and other disorders of the eye. Is the patient up to date with their annual eye exam?  Yes  Who is the provider or what is the name of the office in which the patient attends annual eye exams? Dr. Ruffin Frederick Patient to call schedule appointment  If pt is not established with a provider, would they like to be referred to a provider to establish care? No .   Dental Screening: Recommended annual dental exams for proper oral hygiene  Community Resource Referral / Chronic Care Management: CRR required this visit?  No   CCM required this visit?  No      Plan:     I have personally reviewed and noted the following in the patient's chart:   Medical and social history Use of alcohol, tobacco or illicit drugs  Current medications and supplements including opioid prescriptions. Patient is not currently taking opioid prescriptions. Functional ability and status Nutritional status Physical activity Advanced directives List of other physicians Hospitalizations, surgeries, and ER visits in previous 12 months Vitals Screenings to include cognitive, depression, and falls Referrals and appointments  In addition, I have reviewed  and discussed with patient certain preventive protocols, quality metrics, and best practice recommendations. A written personalized care plan for preventive services as well as general preventive health recommendations were provided to patient.     Daphane Shepherd, LPN   1/75/1025   Nurse Notes: Aim for 30 minutes of exercise or brisk walking, 6-8 glasses of water, and 5 servings of fruits and vegetables each day.   Medical screening examination/treatment/procedure(s) were performed by non-physician practitioner and as supervising physician I was immediately available for consultation/collaboration.  I agree with above. Lew Dawes, MD

## 2021-11-24 NOTE — Patient Instructions (Signed)
[name] , Thank you for taking time to come for your Medicare Wellness Visit. I appreciate your ongoing commitment to your health goals. Please review the following plan we discussed and let me know if I can assist you in the future.   Screening recommendations/referrals: Colonoscopy: no longer required  Recommended yearly ophthalmology/optometry visit for glaucoma screening and checkup Recommended yearly dental visit for hygiene and checkup  Vaccinations: Influenza vaccine: completed  Pneumococcal vaccine: completed  Tdap vaccine: due  Shingles vaccine: will consider    Covid-19: completed   Advanced directives: yes  Conditions/risks identified: Aim for 30 minutes of exercise or brisk walking, 6-8 glasses of water, and 5 servings of fruits and vegetables each day.   Next appointment: Follow up in one year for your annual wellness visit.   Preventive Care 81 Years and Older, Male  Preventive care refers to lifestyle choices and visits with your health care provider that can promote health and wellness. What does preventive care include? A yearly physical exam. This is also called an annual well check. Dental exams once or twice a year. Routine eye exams. Ask your health care provider how often you should have your eyes checked. Personal lifestyle choices, including: Daily care of your teeth and gums. Regular physical activity. Eating a healthy diet. Avoiding tobacco and drug use. Limiting alcohol use. Practicing safe sex. Taking low doses of aspirin every day. Taking vitamin and mineral supplements as recommended by your health care provider. What happens during an annual well check? The services and screenings done by your health care provider during your annual well check will depend on your age, overall health, lifestyle risk factors, and family history of disease. Counseling  Your health care provider may ask you questions about your: Alcohol use. Tobacco use. Drug  use. Emotional well-being. Home and relationship well-being. Sexual activity. Eating habits. History of falls. Memory and ability to understand (cognition). Work and work Statistician. Screening  You may have the following tests or measurements: Height, weight, and BMI. Blood pressure. Lipid and cholesterol levels. These may be checked every 5 years, or more frequently if you are over 73 years old. Skin check. Lung cancer screening. You may have this screening every year starting at age 34 if you have a 30-pack-year history of smoking and currently smoke or have quit within the past 15 years. Fecal occult blood test (FOBT) of the stool. You may have this test every year starting at age 57. Flexible sigmoidoscopy or colonoscopy. You may have a sigmoidoscopy every 5 years or a colonoscopy every 10 years starting at age 32. Prostate cancer screening. Recommendations will vary depending on your family history and other risks. Hepatitis C blood test. Hepatitis B blood test. Sexually transmitted disease (STD) testing. Diabetes screening. This is done by checking your blood sugar (glucose) after you have not eaten for a while (fasting). You may have this done every 1-3 years. Abdominal aortic aneurysm (AAA) screening. You may need this if you are a current or former smoker. Osteoporosis. You may be screened starting at age 74 if you are at high risk. Talk with your health care provider about your test results, treatment options, and if necessary, the need for more tests. Vaccines  Your health care provider may recommend certain vaccines, such as: Influenza vaccine. This is recommended every year. Tetanus, diphtheria, and acellular pertussis (Tdap, Td) vaccine. You may need a Td booster every 10 years. Zoster vaccine. You may need this after age 18. Pneumococcal 13-valent conjugate (  PCV13) vaccine. One dose is recommended after age 69. Pneumococcal polysaccharide (PPSV23) vaccine. One dose is  recommended after age 38. Talk to your health care provider about which screenings and vaccines you need and how often you need them. This information is not intended to replace advice given to you by your health care provider. Make sure you discuss any questions you have with your health care provider. Document Released: 04/12/2015 Document Revised: 12/04/2015 Document Reviewed: 01/15/2015 Elsevier Interactive Patient Education  2017 [employer] Prevention in the Home Falls can cause injuries. They can happen to people of all ages. There are many things you can do to make your home safe and to help prevent falls. What can I do on the outside of my home? Regularly fix the edges of walkways and driveways and fix any cracks. Remove anything that might make you trip as you walk through a door, such as a raised step or threshold. Trim any bushes or trees on the path to your home. Use bright outdoor lighting. Clear any walking paths of anything that might make someone trip, such as rocks or tools. Regularly check to see if handrails are loose or broken. Make sure that both sides of any steps have handrails. Any raised decks and porches should have guardrails on the edges. Have any leaves, snow, or ice cleared regularly. Use sand or salt on walking paths during winter. Clean up any spills in your garage right away. This includes oil or grease spills. What can I do in the bathroom? Use night lights. Install grab bars by the toilet and in the tub and shower. Do not use towel bars as grab bars. Use non-skid mats or decals in the tub or shower. If you need to sit down in the shower, use a plastic, non-slip stool. Keep the floor dry. Clean up any water that spills on the floor as soon as it happens. Remove soap buildup in the tub or shower regularly. Attach bath mats securely with double-sided non-slip rug tape. Do not have throw rugs and other things on the floor that can make you  trip. What can I do in the bedroom? Use night lights. Make sure that you have a light by your bed that is easy to reach. Do not use any sheets or blankets that are too big for your bed. They should not hang down onto the floor. Have a firm chair that has side arms. You can use this for support while you get dressed. Do not have throw rugs and other things on the floor that can make you trip. What can I do in the kitchen? Clean up any spills right away. Avoid walking on wet floors. Keep items that you use a lot in easy-to-reach places. If you need to reach something above you, use a strong step stool that has a grab bar. Keep electrical cords out of the way. Do not use floor polish or wax that makes floors slippery. If you must use wax, use non-skid floor wax. Do not have throw rugs and other things on the floor that can make you trip. What can I do with my stairs? Do not leave any items on the stairs. Make sure that there are handrails on both sides of the stairs and use them. Fix handrails that are broken or loose. Make sure that handrails are as long as the stairways. Check any carpeting to make sure that it is firmly attached to the stairs. Fix any carpet that is  loose or worn. Avoid having throw rugs at the top or bottom of the stairs. If you do have throw rugs, attach them to the floor with carpet tape. Make sure that you have a light switch at the top of the stairs and the bottom of the stairs. If you do not have them, ask someone to add them for you. What else can I do to help prevent falls? Wear shoes that: Do not have high heels. Have rubber bottoms. Are comfortable and fit you well. Are closed at the toe. Do not wear sandals. If you use a stepladder: Make sure that it is fully opened. Do not climb a closed stepladder. Make sure that both sides of the stepladder are locked into place. Ask someone to hold it for you, if possible. Clearly mark and make sure that you can  see: Any grab bars or handrails. First and last steps. Where the edge of each step is. Use tools that help you move around (mobility aids) if they are needed. These include: Canes. Walkers. Scooters. Crutches. Turn on the lights when you go into a dark area. Replace any light bulbs as soon as they burn out. Set up your furniture so you have a clear path. Avoid moving your furniture around. If any of your floors are uneven, fix them. If there are any pets around you, be aware of where they are. Review your medicines with your doctor. Some medicines can make you feel dizzy. This can increase your chance of falling. Ask your doctor what other things that you can do to help prevent falls. This information is not intended to replace advice given to you by your health care provider. Make sure you discuss any questions you have with your health care provider. Document Released: 01/10/2009 Document Revised: 08/22/2015 Document Reviewed: 04/20/2014 Elsevier Interactive Patient Education  2017 [employer].

## 2021-11-28 ENCOUNTER — Ambulatory Visit (INDEPENDENT_AMBULATORY_CARE_PROVIDER_SITE_OTHER): Payer: PPO | Admitting: Internal Medicine

## 2021-11-28 ENCOUNTER — Encounter: Payer: Self-pay | Admitting: Internal Medicine

## 2021-11-28 DIAGNOSIS — I6340 Cerebral infarction due to embolism of unspecified cerebral artery: Secondary | ICD-10-CM

## 2021-11-28 DIAGNOSIS — G4733 Obstructive sleep apnea (adult) (pediatric): Secondary | ICD-10-CM | POA: Diagnosis not present

## 2021-11-28 DIAGNOSIS — I10 Essential (primary) hypertension: Secondary | ICD-10-CM | POA: Diagnosis not present

## 2021-11-28 DIAGNOSIS — L309 Dermatitis, unspecified: Secondary | ICD-10-CM | POA: Insufficient documentation

## 2021-11-28 DIAGNOSIS — L308 Other specified dermatitis: Secondary | ICD-10-CM | POA: Diagnosis not present

## 2021-11-28 DIAGNOSIS — Z7901 Long term (current) use of anticoagulants: Secondary | ICD-10-CM | POA: Diagnosis not present

## 2021-11-28 LAB — CBC WITH DIFFERENTIAL/PLATELET
Basophils Absolute: 0.1 10*3/uL (ref 0.0–0.1)
Basophils Relative: 0.9 % (ref 0.0–3.0)
Eosinophils Absolute: 0.1 10*3/uL (ref 0.0–0.7)
Eosinophils Relative: 1.9 % (ref 0.0–5.0)
HCT: 38.2 % — ABNORMAL LOW (ref 39.0–52.0)
Hemoglobin: 13 g/dL (ref 13.0–17.0)
Lymphocytes Relative: 28.4 % (ref 12.0–46.0)
Lymphs Abs: 1.7 10*3/uL (ref 0.7–4.0)
MCHC: 33.9 g/dL (ref 30.0–36.0)
MCV: 92.7 fl (ref 78.0–100.0)
Monocytes Absolute: 0.6 10*3/uL (ref 0.1–1.0)
Monocytes Relative: 10.7 % (ref 3.0–12.0)
Neutro Abs: 3.4 10*3/uL (ref 1.4–7.7)
Neutrophils Relative %: 58.1 % (ref 43.0–77.0)
Platelets: 178 10*3/uL (ref 150.0–400.0)
RBC: 4.12 Mil/uL — ABNORMAL LOW (ref 4.22–5.81)
RDW: 13.2 % (ref 11.5–15.5)
WBC: 5.9 10*3/uL (ref 4.0–10.5)

## 2021-11-28 LAB — COMPREHENSIVE METABOLIC PANEL
ALT: 19 U/L (ref 0–53)
AST: 19 U/L (ref 0–37)
Albumin: 4 g/dL (ref 3.5–5.2)
Alkaline Phosphatase: 111 U/L (ref 39–117)
BUN: 18 mg/dL (ref 6–23)
CO2: 28 mEq/L (ref 19–32)
Calcium: 9.3 mg/dL (ref 8.4–10.5)
Chloride: 101 mEq/L (ref 96–112)
Creatinine, Ser: 0.76 mg/dL (ref 0.40–1.50)
GFR: 84.6 mL/min (ref >60.00)
Glucose, Bld: 87 mg/dL (ref 70–99)
Potassium: 3.7 mEq/L (ref 3.5–5.1)
Sodium: 137 mEq/L (ref 135–145)
Total Bilirubin: 0.6 mg/dL (ref 0.2–1.2)
Total Protein: 7 g/dL (ref 6.0–8.3)

## 2021-11-28 MED ORDER — OLMESARTAN MEDOXOMIL-HCTZ 20-12.5 MG PO TABS: 1.0000 | ORAL_TABLET | Freq: Every day | ORAL | 3 refills | Status: DC

## 2021-11-28 MED ORDER — TRIAMCINOLONE ACETONIDE 0.1 % EX OINT: 1.0000 | TOPICAL_OINTMENT | Freq: Three times a day (TID) | CUTANEOUS | 2 refills | Status: DC

## 2021-11-28 MED ORDER — FLUOXETINE HCL 40 MG PO CAPS: 40.0000 mg | ORAL_CAPSULE | Freq: Every day | ORAL | 2 refills | Status: DC

## 2021-11-28 MED ORDER — OXYCODONE-ACETAMINOPHEN 10-325 MG PO TABS: 1.0000 | ORAL_TABLET | Freq: Four times a day (QID) | ORAL | 0 refills | Status: AC | PRN

## 2021-11-28 MED ORDER — PRAVASTATIN SODIUM 20 MG PO TABS
20.0000 mg | ORAL_TABLET | Freq: Every day | ORAL | 3 refills | Status: DC
Start: 2021-11-28 — End: 2022-01-22

## 2021-11-28 MED ORDER — GABAPENTIN 300 MG PO CAPS
300.0000 mg | ORAL_CAPSULE | Freq: Three times a day (TID) | ORAL | 3 refills | Status: DC
Start: 2021-11-28 — End: 2023-02-02

## 2021-11-28 NOTE — Assessment & Plan Note (Signed)
R forearm Rx: Triamc oint bid-tid

## 2021-11-28 NOTE — Assessment & Plan Note (Signed)
Cont on Eliquis- 

## 2021-11-28 NOTE — Assessment & Plan Note (Signed)
On Benicar HCT, Norvasc

## 2021-11-28 NOTE — Progress Notes (Signed)
Subjective:  Patient ID: Vernon Bishop, male    DOB: 20-May-1940  Age: 81 y.o. MRN: [id number]  CC: [id number] up   HPI TLady Deutscherpresents for LBP, HTN, depression Seeing Dr Vernon Bishop Dr Vernon [facility] Medications Prior to Visit  Medication Sig Dispense Refill   acetaminophen (TYLENOL) 325 MG tablet Take 325-650 mg by mouth every 6 (six) hours as needed for mild pain or headache.     amLODipine (NORVASC) 5 MG tablet TAKE 1 TABLET BY MOUTH DAILY. 90 tablet 3   apixaban (ELIQUIS) 5 MG TABS tablet Take 1 tablet (5 mg total) by mouth 2 (two) times daily. 180 tablet 3   Cholecalciferol (VITAMIN D3 PO) Take 1 tablet by mouth daily.     clotrimazole-betamethasone (LOTRISONE) cream Apply 1 application. topically 2 (two) times daily. (Patient taking differently: Apply 1 application  topically daily as needed (to affected areas of the face).) 45 g 1   Cyanocobalamin (VITAMIN B-12 CR) 1000 MCG TBCR Take 1,000 mcg by mouth daily.       furosemide (LASIX) 20 MG tablet TAKE 1 TABLET (20 MG TOTAL) BY MOUTH DAILY. (Patient taking differently: Take 20 mg by mouth daily as needed for edema.) 30 tablet 3   PRESCRIPTION MEDICATION CPAP- At bedtime     propranolol (INDERAL) 80 MG tablet TAKE 1 TABLET BY MOUTH 3 TIMES DAILY. (Patient taking differently: Take 80 mg by mouth 3 (three) times daily.) 270 tablet 3   sodium chloride (OCEAN) 0.65 % SOLN nasal spray Place 1 spray into both nostrils as needed for congestion.     tetrahydrozoline 0.05 % ophthalmic solution Place 1 drop into both eyes 2 (two) times daily as needed (for redness).     tiZANidine (ZANAFLEX) 4 MG tablet TAKE 1 TABLET BY MOUTH EVERY 8 HOURS AS NEEDED FOR MUSCLE SPASMS. (Patient taking differently: Take 4 mg by mouth every 8 (eight) hours as needed for muscle spasms.) 60 tablet 1   traZODone (DESYREL) 50 MG tablet TAKE 1 TO 2 TABLETS BY MOUTH AT BEDTIME. (Patient taking differently: Take 100 mg by mouth at bedtime.) 60 tablet 5   zinc  gluconate 50 MG tablet Take 50 mg by mouth daily.     FLUoxetine (PROZAC) 40 MG capsule TAKE 1 CAPSULE BY MOUTH DAILY. (Patient taking differently: Take 40 mg by mouth in the morning.) 90 capsule 2   gabapentin (NEURONTIN) 300 MG capsule TAKE 1 CAPSULE BY MOUTH THREE TIMES A DAY (Patient taking differently: Take 300 mg by mouth 3 (three) times daily.) 90 capsule 11   olmesartan-hydrochlorothiazide (BENICAR HCT) 20-12.5 MG tablet TAKE 1 TABLET BY MOUTH DAILY. 30 tablet 3   pravastatin (PRAVACHOL) 20 MG tablet TAKE 1 TABLET (20 MG TOTAL) BY MOUTH DAILY. 30 tablet 1   No facility-administered medications prior to visit.    ROS: Review of Systems  Constitutional:  Positive for fatigue. Negative for appetite change and unexpected weight change.  HENT:  Negative for congestion, nosebleeds, sneezing, sore throat and trouble swallowing.   Eyes:  Negative for itching and visual disturbance.  Respiratory:  Negative for cough.   Cardiovascular:  Negative for chest pain, palpitations and leg swelling.  Gastrointestinal:  Negative for abdominal distention, blood in stool, diarrhea and nausea.  Genitourinary:  Negative for frequency and hematuria.  Musculoskeletal:  Positive for arthralgias, back pain and gait problem. Negative for joint swelling and neck pain.  Skin:  Positive for rash.  Neurological:  Positive for weakness. Negative for  dizziness, tremors and speech difficulty.  Psychiatric/Behavioral:  Positive for dysphoric mood. Negative for agitation, sleep disturbance and suicidal ideas. The patient is not nervous/anxious.     Objective:  BP 128/74 (BP Location: Right Arm, Patient Position: Sitting, Cuff Size: Large)   Pulse (!) 53   Temp 98.1 F (36.7 C) (Oral)   Ht 6' (1.829 m)   Wt 215 lb (97.5 kg)   SpO2 98%   BMI 29.16 kg/m   BP Readings from Last 3 Encounters:  11/28/21 128/74  09/03/21 126/80  07/23/21 (!) 162/61    Wt Readings from Last 3 Encounters:  11/28/21 215 lb (97.5  kg)  11/24/21 209 lb (94.8 kg)  09/03/21 209 lb 6.4 oz (95 kg)    Physical Exam Constitutional:      General: He is not in acute distress.    Appearance: He is well-developed. He is obese.     Comments: NAD  Eyes:     Conjunctiva/sclera: Conjunctivae normal.     Pupils: Pupils are equal, round, and reactive to light.  Neck:     Thyroid: No thyromegaly.     Vascular: No JVD.  Cardiovascular:     Rate and Rhythm: Normal rate and regular rhythm.     Heart sounds: Normal heart sounds. No murmur heard.    No friction rub. No gallop.  Pulmonary:     Effort: Pulmonary effort is normal. No respiratory distress.     Breath sounds: Normal breath sounds. No wheezing or rales.  Chest:     Chest wall: No tenderness.  Abdominal:     General: Bowel sounds are normal. There is no distension.     Palpations: Abdomen is soft. There is no mass.     Tenderness: There is no abdominal tenderness. There is no guarding or rebound.  Musculoskeletal:        General: Tenderness present. Normal range of motion.     Cervical back: Normal range of motion.  Lymphadenopathy:     Cervical: No cervical adenopathy.  Skin:    General: Skin is warm and dry.     Findings: No rash.  Neurological:     Mental Status: He is alert and oriented to person, place, and time.     Cranial Nerves: No cranial nerve deficit.     Motor: No abnormal muscle tone.     Coordination: Coordination normal.     Gait: Gait abnormal.     Deep Tendon Reflexes: Reflexes are normal and symmetric.  Psychiatric:        Behavior: Behavior normal.        Thought Content: Thought content normal.        Judgment: Judgment normal.   Dry rash patch - R forearm LS w/pain Using the cane  Lab Results  Component Value Date   WBC 6.1 06/08/2021   HGB 13.9 06/08/2021   HCT 41.0 06/08/2021   PLT 199 06/08/2021   GLUCOSE 91 07/01/2021   CHOL 157 06/09/2021   TRIG 99 06/09/2021   HDL 33 (L) 06/09/2021   LDLCALC 104 (H) 06/09/2021    ALT 19 06/08/2021   AST 23 06/08/2021   NA 141 07/01/2021   K 4.1 07/01/2021   CL 101 07/01/2021   CREATININE 0.88 07/01/2021   BUN 19 07/01/2021   CO2 24 07/01/2021   TSH 2.18 06/03/2021   PSA 0.73 12/20/2019   INR 1.1 06/08/2021   HGBA1C 5.0 06/09/2021   MICROALBUR 3.9 12/20/2019    ECHOCARDIOGRAM  COMPLETE BUBBLE STUDY  Result Date: 06/09/2021    ECHOCARDIOGRAM REPORT   Patient Name:   JSIAH MENTA Date of Exam: 06/09/2021 Medical Rec #:  [id number]       Height:       72.0 in Accession #:    [id number]      Weight:       212.0 lb Date of Birth:  08/10/1940        BSA:          2.184 m Patient Age:    48 years        BP:           135/55 mmHg Patient Gender: M               HR:           46 bpm. Exam Location:  Inpatient Procedure: 2D Echo, Color Doppler and Saline Contrast Bubble Study Indications:    I63.9 / 434.91 STROKE  History:        Patient has prior history of Echocardiogram examinations, most                 recent 01/15/2021. Arrythmias:Atrial Fibrillation and                 Bradycardia; Risk Factors:Diabetes, Hypertension and                 Dyslipidemia. DYSRHYTHMIA / MVP.  Sonographer:    Beryle Beams Referring Phys: [id number] [place] T TU IMPRESSIONS  1. Left ventricular ejection fraction, by estimation, is 55 to 60%. The left ventricle has normal function. The left ventricle has no regional wall motion abnormalities. There is mild concentric left ventricular hypertrophy. Left ventricular diastolic parameters are consistent with Grade II diastolic dysfunction (pseudonormalization).  2. Right ventricular systolic function is normal. The right ventricular size is normal. There is normal pulmonary artery systolic pressure. The estimated right ventricular systolic pressure is 76.2 mmHg.  3. Left atrial size was severely dilated.  4. Right atrial size was mildly dilated.  5. The mitral valve is normal in structure. Trivial mitral valve regurgitation. No evidence of mitral stenosis.  6. The  aortic valve is tricuspid. Aortic valve regurgitation is trivial. Aortic valve sclerosis is present, with no evidence of aortic valve stenosis. Aortic valve area, by VTI measures 2.27 cm. Aortic valve mean gradient measures 5.0 mmHg. Aortic valve Vmax measures 1.62 m/s.  7. The inferior vena cava is dilated in size with >50% respiratory variability, suggesting right atrial pressure of 8 mmHg. FINDINGS  Left Ventricle: Left ventricular ejection fraction, by estimation, is 55 to 60%. The left ventricle has normal function. The left ventricle has no regional wall motion abnormalities. The left ventricular internal cavity size was normal in size. There is  mild concentric left ventricular hypertrophy. Left ventricular diastolic parameters are consistent with Grade II diastolic dysfunction (pseudonormalization). Normal left ventricular filling pressure. Right Ventricle: The right ventricular size is normal. No increase in right ventricular wall thickness. Right ventricular systolic function is normal. There is normal pulmonary artery systolic pressure. The tricuspid regurgitant velocity is 2.48 m/s, and  with an assumed right atrial pressure of 8 mmHg, the estimated right ventricular systolic pressure is 83.1 mmHg. Left Atrium: Left atrial size was severely dilated. Right Atrium: Right atrial size was mildly dilated. Pericardium: There is no evidence of pericardial effusion. Mitral Valve: The mitral valve is normal in structure. Trivial mitral valve regurgitation. No evidence of mitral valve stenosis.  Tricuspid Valve: The tricuspid valve is normal in structure. Tricuspid valve regurgitation is trivial. No evidence of tricuspid stenosis. Aortic Valve: The aortic valve is tricuspid. Aortic valve regurgitation is trivial. Aortic valve sclerosis is present, with no evidence of aortic valve stenosis. Aortic valve mean gradient measures 5.0 mmHg. Aortic valve peak gradient measures 10.5 mmHg.  Aortic valve area, by VTI  measures 2.27 cm. Pulmonic Valve: The pulmonic valve was normal in structure. Pulmonic valve regurgitation is trivial. No evidence of pulmonic stenosis. Aorta: The aortic root is normal in size and structure. Venous: The inferior vena cava is dilated in size with greater than 50% respiratory variability, suggesting right atrial pressure of 8 mmHg. IAS/Shunts: No atrial level shunt detected by color flow Doppler. Agitated saline contrast was given intravenously to evaluate for intracardiac shunting.  LEFT VENTRICLE PLAX 2D LVIDd:         5.60 cm      Diastology LVIDs:         3.20 cm      LV e' medial:    5.44 cm/s LV PW:         1.20 cm      LV E/e' medial:  13.4 LV IVS:        1.40 cm      LV e' lateral:   6.84 cm/s LVOT diam:     2.00 cm      LV E/e' lateral: 10.6 LV SV:         92 LV SV Index:   42 LVOT Area:     3.14 cm  LV Volumes (MOD) LV vol d, MOD A2C: 106.0 ml LV vol d, MOD A4C: 111.0 ml LV vol s, MOD A2C: 42.5 ml LV vol s, MOD A4C: 49.6 ml LV SV MOD A2C:     63.5 ml LV SV MOD A4C:     111.0 ml LV SV MOD BP:      64.5 ml RIGHT VENTRICLE             IVC RV S prime:     21.90 cm/s  IVC diam: 2.10 cm TAPSE (M-mode): 3.6 cm LEFT ATRIUM              Index        RIGHT ATRIUM           Index LA diam:        4.20 cm  1.92 cm/m   RA Area:     24.70 cm LA Vol (A2C):   105.0 ml 48.08 ml/m  RA Volume:   80.90 ml  37.04 ml/m LA Vol (A4C):   97.2 ml  44.51 ml/m LA Biplane Vol: 104.0 ml 47.62 ml/m  AORTIC VALVE                     PULMONIC VALVE AV Area (Vmax):    2.25 cm      PV Vmax:       0.55 m/s AV Area (Vmean):   2.13 cm      PV Vmean:      34.000 cm/s AV Area (VTI):     2.27 cm      PV VTI:        0.130 m AV Vmax:           162.00 cm/s   PV Peak grad:  1.2 mmHg AV Vmean:          105.000 cm/s  PV Mean grad:  1.0 mmHg  AV VTI:            0.407 m AV Peak Grad:      10.5 mmHg AV Mean Grad:      5.0 mmHg LVOT Vmax:         116.00 cm/s LVOT Vmean:        71.100 cm/s LVOT VTI:          0.294 m LVOT/AV VTI  ratio: 0.72  AORTA Ao Root diam: 2.80 cm Ao Asc diam:  3.50 cm MITRAL VALVE               TRICUSPID VALVE MV Area (PHT): 3.53 cm    TR Peak grad:   24.6 mmHg MV Decel Time: 215 msec    TR Mean grad:   16.0 mmHg MV E velocity: 72.80 cm/s  TR Vmax:        248.00 cm/s MV A velocity: 80.60 cm/s  TR Vmean:       190.0 cm/s MV E/A ratio:  0.90                            SHUNTS                            Systemic VTI:  0.29 m                            Systemic Diam: 2.00 cm Fransico Him MD Electronically signed by Fransico Him MD Signature Date/Time: 06/09/2021/3:22:21 PM    Final    CT ANGIO HEAD NECK W WO CM  Result Date: 06/08/2021 CLINICAL DATA:  Follow-up examination for acute stroke. EXAM: CT ANGIOGRAPHY HEAD AND NECK TECHNIQUE: Multidetector CT imaging of the head and neck was performed using the standard protocol during bolus administration of intravenous contrast. Multiplanar CT image reconstructions and MIPs were obtained to evaluate the vascular anatomy. Carotid stenosis measurements (when applicable) are obtained utilizing NASCET criteria, using the distal internal carotid diameter as the denominator. RADIATION DOSE REDUCTION: This exam was performed according to the departmental dose-optimization program which includes automated exposure control, adjustment of the mA and/or kV according to patient size and/or use of iterative reconstruction technique. CONTRAST:  185m OMNIPAQUE IOHEXOL 350 MG/ML SOLN COMPARISON:  Prior MRI from earlier the same day. FINDINGS: CT HEAD FINDINGS Brain: Cerebral volume within normal limits. Previously identified small acute and chronic pontine infarcts noted, better evaluated on prior MRI. No acute intracranial hemorrhage. No other acute large vessel territory infarct. No mass lesion or midline shift. No hydrocephalus or extra-axial fluid collection. Vascular: No hyperdense vessel. Scattered vascular calcifications noted within the carotid siphons. Skull: Scalp soft tissues  and calvarium within normal limits. Sinuses: Mild mucosal thickening noted within the ethmoidal air cells. Paranasal sinuses and mastoid air cells are otherwise clear. Orbits: Globes and orbital soft tissues demonstrate no acute finding. Review of the MIP images confirms the above findings CTA NECK FINDINGS Aortic arch: Visualized aortic arch normal caliber with normal branch pattern. No stenosis about the origin of the great vessels. Right carotid system: Right common and internal carotid arteries widely patent without stenosis, dissection or occlusion. Left carotid system: Left common and internal carotid arteries widely patent without stenosis, dissection or occlusion. Vertebral arteries: Both vertebral arteries arise from the subclavian arteries. No proximal subclavian artery stenosis. Both vertebral arteries widely patent without stenosis, dissection  or occlusion. Skeleton: No discrete or worrisome osseous lesions. Prior ACDF at C3 through C7. 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 Extensive bulky osteophytic spurring within the visualized upper thoracic spine, suggesting DISH. Other neck: No other acute soft tissue abnormality within the neck. Upper chest: Visualized upper chest demonstrates no acute finding. Review of the MIP images confirms the above findings CTA HEAD FINDINGS Anterior circulation: Both internal carotid arteries patent to the termini without stenosis or other abnormality. Left A1 widely patent. Right A1 hypoplastic and/or absent, accounting for the diminutive right ICA is compared to the left. Normal anterior communicating artery complex. Anterior cerebral arteries patent to their distal aspects without significant stenosis. Normal in stenosis or occlusion. Normal MCA bifurcations. Distal MCA branches perfused and symmetric. Posterior circulation: Both V4 segments patent to the vertebrobasilar junction without significant stenosis. Left vertebral artery dominant. Left PICA patent. Right PICA  not seen. Moderate atheromatous irregularity within the basilar artery without high-grade stenosis. Superior cerebellar arteries patent bilaterally. Predominant fetal type origin of the right PCA supplied via a robust right posterior communicating artery. Tiny 2 mm outpouching at the proximal right P2 segment favored to reflect a vascular infundibulum related to a hypoplastic right P1 segment. Moderate atheromatous irregularity throughout the right PCA without high-grade stenosis. The contralateral left PCA is primarily supplied via the basilar and is severely attenuated and only faintly visible. Left PCA appears to remain grossly patent to its distal aspect. Venous sinuses: Patent allowing for timing the contrast bolus. Anatomic variants: Predominant fetal type origin of the right PCA. Hypoplastic/absent right A1 with the ACAs supplied via the left carotid artery system. No aneurysm. Review of the MIP images confirms the above findings IMPRESSION: 1. Negative CTA for large vessel occlusion or other emergent finding. 2. Moderate atheromatous disease involving the intracranial posterior circulation without correctable stenosis. The left PCA is severely attenuated and only faintly visible, but remains grossly patent to its distal aspect. Predominant fetal type origin of the right PCA. 3. Wide patency of the major arterial vasculature of the neck. Electronically Signed   By: Jeannine Boga M.D.   On: 06/08/2021 21:25   MR BRAIN W WO CONTRAST  Result Date: 06/08/2021 CLINICAL DATA:  Tongue paralysis/weakness (CN 12).  Dysarthria. EXAM: MRI HEAD WITHOUT AND WITH CONTRAST TECHNIQUE: Multiplanar, multiecho pulse sequences of the brain and surrounding structures were obtained without and with intravenous contrast. CONTRAST:  9.85m GADAVIST GADOBUTROL 1 MMOL/ML IV SOLN COMPARISON:  Head CT 02/27/2005 FINDINGS: Brain: A 7 mm acute infarct is present in the left paramedian pons. A separate, smaller chronic infarct is  noted more laterally in the pons on the left. T2 hyperintensities in the cerebral white matter bilaterally are nonspecific but compatible with minimal chronic small vessel ischemic disease, not advanced for age. No intracranial hemorrhage, mass, midline shift, or extra-axial fluid collection is identified. There is no abnormal intracranial enhancement. There is mild generalized cerebral atrophy which is within normal limits for age. Vascular: Major intracranial vascular flow voids are preserved. Skull and upper cervical spine: Unremarkable bone marrow signal. Sinuses/Orbits: Bilateral cataract extraction. Clear paranasal sinuses. Trace right mastoid fluid. Other: None. IMPRESSION: Small acute and chronic pontine infarcts. Electronically Signed   By: ALogan BoresM.D.   On: 06/08/2021 18:38    Assessment & Plan:   Problem List Items Addressed This Visit     Anticoagulant long-term use    Cont on Eliquis      [place]  [place] Eliquis      Relevant Medications   pravastatin (PRAVACHOL) 20 MG tablet   olmesartan-hydrochlorothiazide (BENICAR HCT) 20-12.5 MG tablet   Eczema    R forearm Rx: Triamc oint bid-tid      Essential hypertension   Relevant Medications   pravastatin (PRAVACHOL) 20 MG tablet   olmesartan-hydrochlorothiazide (BENICAR HCT) 20-12.5 MG tablet   HTN (hypertension)    On Benicar HCT, Norvasc      Relevant Medications   pravastatin (PRAVACHOL) 20 MG tablet   olmesartan-hydrochlorothiazide (BENICAR HCT) 20-12.5 MG tablet      Meds ordered this encounter  Medications   pravastatin (PRAVACHOL) 20 MG tablet    Sig: Take 1 tablet (20 mg total) by mouth daily.    Dispense:  90 tablet    Refill:  3   oxyCODONE-acetaminophen (PERCOCET) 10-325 MG tablet    Sig: Take 1 tablet by mouth every 6 (six) hours as needed for pain.    Dispense:  15 tablet    Refill:  0    Dr Nelva Bishop   olmesartan-hydrochlorothiazide (BENICAR HCT) 20-12.5 MG tablet     Sig: Take 1 tablet by mouth daily.    Dispense:  90 tablet    Refill:  3   FLUoxetine (PROZAC) 40 MG capsule    Sig: Take 1 capsule (40 mg total) by mouth daily.    Dispense:  90 capsule    Refill:  2   gabapentin (NEURONTIN) 300 MG capsule    Sig: Take 1 capsule (300 mg total) by mouth 3 (three) times daily.    Dispense:  270 capsule    Refill:  3   triamcinolone ointment (KENALOG) 0.1 %    Sig: Apply 1 Application topically 3 (three) times daily.    Dispense:  80 g    Refill:  2      Follow-up: Return in about 4 months (around 03/30/2022) for a follow-up visit.  Walker Kehr, MD

## 2021-12-03 ENCOUNTER — Other Ambulatory Visit: Payer: Self-pay | Admitting: Internal Medicine

## 2021-12-04 ENCOUNTER — Ambulatory Visit: Payer: PPO | Admitting: Internal Medicine

## 2021-12-28 DIAGNOSIS — G4733 Obstructive sleep apnea (adult) (pediatric): Secondary | ICD-10-CM | POA: Diagnosis not present

## 2022-01-01 ENCOUNTER — Encounter: Payer: Self-pay | Admitting: Gastroenterology

## 2022-01-16 ENCOUNTER — Other Ambulatory Visit: Payer: Self-pay | Admitting: Internal Medicine

## 2022-01-22 ENCOUNTER — Other Ambulatory Visit: Payer: Self-pay

## 2022-01-22 ENCOUNTER — Encounter: Payer: Self-pay | Admitting: Cardiology

## 2022-01-22 ENCOUNTER — Other Ambulatory Visit: Payer: PPO

## 2022-01-22 ENCOUNTER — Ambulatory Visit: Payer: PPO | Admitting: Cardiology

## 2022-01-22 VITALS — BP 142/55 | HR 45 | Ht 72.0 in | Wt 213.8 lb

## 2022-01-22 DIAGNOSIS — R001 Bradycardia, unspecified: Secondary | ICD-10-CM | POA: Diagnosis not present

## 2022-01-22 DIAGNOSIS — I10 Essential (primary) hypertension: Secondary | ICD-10-CM

## 2022-01-22 DIAGNOSIS — E782 Mixed hyperlipidemia: Secondary | ICD-10-CM

## 2022-01-22 DIAGNOSIS — I272 Pulmonary hypertension, unspecified: Secondary | ICD-10-CM

## 2022-01-22 DIAGNOSIS — I480 Paroxysmal atrial fibrillation: Secondary | ICD-10-CM | POA: Diagnosis not present

## 2022-01-22 MED ORDER — PRAVASTATIN SODIUM 40 MG PO TABS: 20.0000 mg | ORAL_TABLET | Freq: Every day | ORAL | 3 refills | Status: DC

## 2022-01-22 MED ORDER — PRAVASTATIN SODIUM 40 MG PO TABS: 40.0000 mg | ORAL_TABLET | Freq: Every evening | ORAL | 3 refills | Status: DC

## 2022-01-22 MED ORDER — EZETIMIBE 10 MG PO TABS: 10.0000 mg | ORAL_TABLET | Freq: Every day | ORAL | 3 refills | Status: AC

## 2022-01-22 NOTE — Progress Notes (Addendum)
Patient referred by Vernon, [name], MD for pulmonary hypertension, preoperative risk stratification  Subjective:   [name], male    DOB: 12-19-1940, 81 y.o.   MRN: [id number]  Chief Complaint  Patient presents with   Atrial Fibrillation          HPI  81 y.o. Caucasian male with hypertension, OSA, remote h/o Afib, pontine stroke (05/2021), pulmonary hypertension  Patient is doing well.  He has no complaints today.  Denies any syncope or presyncope.Reviewed recent test results with the patient, details below.    Initial consultation visit 12/2020: Patient is here with his wife today.  He was originally scheduled to undergo L4-L5 OLIF surgery with Dr. Rolena Infante, assisted by Dr. Carlis Abbott for abdominal exposure.  However, surgery was delayed due to recent finding of pulmonary hypertension on echocardiogram.  He was referred to me for evaluation and management of the same, along with preoperative cardiac risk stratification.  Patient is retired, previously worked as a Programmer, applications.  Currently, his physical activity is significantly limited due to his back pain.  He walks with a cane or a grocery store cart when he is shopping.  He does not have to climb a flight of stairs anywhere.  He walks with a slow pace.  With this level of physical activity, he denies any significant chest pain or shortness of breath symptoms.  He has had longstanding history of OSA, on CPAP for at least 10 years.  He quit smoking in 1970s.  He had atrial fibrillation in 1992, after which he was hospitalized for a day.  He had spontaneous self conversion back to sinus rhythm.  He did report significant palpitations with his A. fib in 1992.  Since then, he has not experienced similar palpitation symptoms, but does report "skipped beat" every now and then.   Current Outpatient Medications:    acetaminophen (TYLENOL) 325 MG tablet, Take 325-650 mg by mouth every 6 (six) hours as needed for mild pain or headache.,  Disp: , Rfl:    amLODipine (NORVASC) 5 MG tablet, TAKE 1 TABLET BY MOUTH DAILY., Disp: 90 tablet, Rfl: 3   apixaban (ELIQUIS) 5 MG TABS tablet, Take 1 tablet (5 mg total) by mouth 2 (two) times daily., Disp: 180 tablet, Rfl: 3   Cholecalciferol (VITAMIN D3 PO), Take 1 tablet by mouth daily., Disp: , Rfl:    clotrimazole-betamethasone (LOTRISONE) cream, Apply 1 application. topically 2 (two) times daily. (Patient taking differently: Apply 1 application  topically daily as needed (to affected areas of the face).), Disp: 45 g, Rfl: 1   Cyanocobalamin (VITAMIN B-12 CR) 1000 MCG TBCR, Take 1,000 mcg by mouth daily.  , Disp: , Rfl:    FLUoxetine (PROZAC) 40 MG capsule, Take 1 capsule (40 mg total) by mouth daily., Disp: 90 capsule, Rfl: 2   furosemide (LASIX) 20 MG tablet, TAKE 1 TABLET (20 MG TOTAL) BY MOUTH DAILY. (Patient taking differently: Take 20 mg by mouth daily as needed for edema.), Disp: 30 tablet, Rfl: 3   gabapentin (NEURONTIN) 300 MG capsule, Take 1 capsule (300 mg total) by mouth 3 (three) times daily., Disp: 270 capsule, Rfl: 3   olmesartan-hydrochlorothiazide (BENICAR HCT) 20-12.5 MG tablet, Take 1 tablet by mouth daily., Disp: 90 tablet, Rfl: 3   oxyCODONE-acetaminophen (PERCOCET) 10-325 MG tablet, Take 1 tablet by mouth every 6 (six) hours as needed for pain., Disp: 15 tablet, Rfl: 0   pravastatin (PRAVACHOL) 20 MG tablet, Take 1 tablet (20 mg total)  by mouth daily., Disp: 90 tablet, Rfl: 3   PRESCRIPTION MEDICATION, CPAP- At bedtime, Disp: , Rfl:    propranolol (INDERAL) 80 MG tablet, TAKE 1 TABLET BY MOUTH 3 TIMES DAILY. (Patient taking differently: Take 80 mg by mouth 3 (three) times daily.), Disp: 270 tablet, Rfl: 3   sodium chloride (OCEAN) 0.65 % SOLN nasal spray, Place 1 spray into both nostrils as needed for congestion., Disp: , Rfl:    tetrahydrozoline 0.05 % ophthalmic solution, Place 1 drop into both eyes 2 (two) times daily as needed (for redness)., Disp: , Rfl:    tiZANidine  (ZANAFLEX) 4 MG tablet, TAKE 1 TABLET BY MOUTH EVERY 8 HOURS AS NEEDED FOR MUSCLE SPASMS. (Patient taking differently: Take 4 mg by mouth every 8 (eight) hours as needed for muscle spasms.), Disp: 60 tablet, Rfl: 1   traZODone (DESYREL) 50 MG tablet, TAKE 1 TO 2 TABLETS BY MOUTH AT BEDTIME., Disp: 60 tablet, Rfl: 5   triamcinolone ointment (KENALOG) 0.1 %, Apply 1 Application topically 3 (three) times daily., Disp: 80 g, Rfl: 2   zinc gluconate 50 MG tablet, Take 50 mg by mouth daily., Disp: , Rfl:    Cardiovascular and other pertinent studies:  EKG 01/22/2022: Marked sinus bradycardia 43 bpm   Mobile cardiac telemetry 13 days 04/25/2021 - 05/09/2021: Dominant rhythm: Sinus. HR 36-152 bpm. Avg HR 52 bpm. 52 episodes of atrial tachycardia/SVT, fastest at 135 bpm for 13 beats, longest for 15.9 secs at 100 bpm. 2.6% isolated SVE, <1% couplet/triplets. 1 episode of VT, at 105 bpm for 5 beats <1% isolated VE, couplet/triplets. No atrial fibrillation/atrial flutter/high grade AV block, sinus pause >3sec noted. 0 patient triggered events.    MRI brain 06/08/2021: Brain: A 7 mm acute infarct is present in the left paramedian pons. A separate, smaller chronic infarct is noted more laterally in the pons on the left. T2 hyperintensities in the cerebral white matter bilaterally are nonspecific but compatible with minimal chronic small vessel ischemic disease, not advanced for age. No intracranial hemorrhage, mass, midline shift, or extra-axial fluid collection is identified. There is no abnormal intracranial enhancement. There is mild generalized cerebral atrophy which is within normal limits for age.   Vascular: Major intracranial vascular flow voids are preserved.   Skull and upper cervical spine: Unremarkable bone marrow signal.   Sinuses/Orbits: Bilateral cataract extraction. Clear paranasal sinuses. Trace right mastoid fluid.   Other: None.   IMPRESSION: Small acute and chronic  pontine infarcts.  CTA head/neck 06/08/2021: 1. Negative CTA for large vessel occlusion or other emergent finding. 2. Moderate atheromatous disease involving the intracranial posterior circulation without correctable stenosis. The left PCA is severely attenuated and only faintly visible, but remains grossly patent to its distal aspect. Predominant fetal type origin of the right PCA. 3. Wide patency of the major arterial vasculature of the neck.      Lexiscan Tetrofosmin stress test 01/31/2021: Lexiscan nuclear stress test performed using 1-day protocol. SPECT images show decreased tracer uptake in inferior myocardium, more prominent at rest. In absence of myocardial thickening or wall motion abnormality, this likely represents attenuation artifact. Stress LVEF 52%. Low risk study.  EKG 01/22/2021: Sinus bradycardia 48 bpm   Echocardiogram 01/15/2021:  1. Left ventricular ejection fraction, by estimation, is 55 to 60%. The  left ventricle has normal function. The left ventricle has no regional  wall motion abnormalities. Left ventricular diastolic parameters are  consistent with Grade I diastolic dysfunction (impaired relaxation).   2. Right ventricular systolic  function is normal. The right ventricular  size is normal. There is moderately elevated pulmonary artery systolic  pressure. The estimated right ventricular systolic pressure is 60.7 mmHg.   3. Left atrial size was moderately dilated.   4. The mitral valve is normal in structure. Trivial mitral valve  regurgitation. No evidence of mitral stenosis.   5. The aortic valve is normal in structure. Aortic valve regurgitation is  trivial. No aortic stenosis is present.   6. Aortic dilatation noted. There is mild dilatation of the ascending  aorta, measuring 40 mm.   7. The inferior vena cava is normal in size with greater than 50%  respiratory variability, suggesting right atrial pressure of 3 mmHg.   Comparison(s): Changes from  prior study are noted. 05/09/12 EF 55-60%. PA  pressure 72mHg.   Recent labs: 11/28/2021: Glucose 87, BUN/Cr 18/0.76. EGFR 84. Na/K 137/3.7. Rest of the CMP normal H/H 13/38. MCV 92. Platelets 178  March-April 2023: HbA1C 5.0% Chol 157, TG 99, HDL 33, LDL 104 TSH 2.1 normal    Review of Systems  Cardiovascular:  Negative for chest pain, dyspnea on exertion, leg swelling, palpitations and syncope.  Musculoskeletal:  Positive for back pain.         Vitals:   01/22/22 1040  BP: (!) 142/55  Pulse: (!) 45  SpO2: 97%     Body mass index is 29 kg/m. Filed Weights   01/22/22 1040  Weight: 213 lb 12.8 oz (97 kg)     Objective:   Physical Exam Vitals and nursing note reviewed.  Constitutional:      General: He is not in acute distress. Neck:     Vascular: No JVD.  Cardiovascular:     Rate and Rhythm: Regular rhythm. Bradycardia present.     Heart sounds: Normal heart sounds. No murmur heard. Pulmonary:     Effort: Pulmonary effort is normal.     Breath sounds: Normal breath sounds. No wheezing or rales.  Musculoskeletal:     Right lower leg: Edema (Trace) present.     Left lower leg: Edema (Trace) present.    No diagnosis found.        Assessment & Recommendations:    81y.o. Caucasian male with hypertension, OSA, remote h/o Afib, pontine stroke (05/2021), pulmonary hypertension  Bradycardia: Known paroxysmal A-fib, not an athlete to explain resting bradycardia.  Not on AV nodal blocking agents.  We will check TSH.  Also recommend to be cardiac telemetry to evaluate for any possibility of sinus node dysfunction.  Stroke: Small acute on chronic pontine infarcts discovered in March 2023. Known remote history of A-fib.  Even though cardiac telemetry (2023) did not show A-fib, most likely etiology of his multifocal stroke is paroxysmal A-fib. High CHA2DS2VASc score.  Continue Eliquis 5 mg twice daily. He did not tolerate atorvastatin due to severe fatigue.   LDL remains >100 on pravastatin 20 mg daily.  Increase pravastatin to 40 mg daily added Zetia 10 mg daily.  Repeat lipid panel in 3 months.    Pulmonary hypertension: I personally reviewed and independently interpreted prior EKG as an echocardiogram. Patient has moderate left atrial dilatation, grade 1 diastolic dysfunction with preserved LVEF, no right-sided dilation, moderate TR, with estimated RVSP 45 mmHg. 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   He is clinically stable and does not need any further  cardiac workup at this time.   Hypertension: Generally well controlled.  F/u in 3 months    Nigel Mormon, MD Pager: [phone] Office: [phone]

## 2022-02-02 ENCOUNTER — Ambulatory Visit (INDEPENDENT_AMBULATORY_CARE_PROVIDER_SITE_OTHER): Payer: PPO | Admitting: Neurology

## 2022-02-02 ENCOUNTER — Encounter: Payer: Self-pay | Admitting: Neurology

## 2022-02-02 VITALS — BP 144/76 | HR 59 | Ht 72.0 in | Wt 215.0 lb

## 2022-02-02 DIAGNOSIS — R2681 Unsteadiness on feet: Secondary | ICD-10-CM | POA: Diagnosis not present

## 2022-02-02 DIAGNOSIS — I6340 Cerebral infarction due to embolism of unspecified cerebral artery: Secondary | ICD-10-CM | POA: Diagnosis not present

## 2022-02-02 DIAGNOSIS — R001 Bradycardia, unspecified: Secondary | ICD-10-CM | POA: Diagnosis not present

## 2022-02-02 NOTE — Patient Instructions (Signed)
Follow-up with [name] for your left knee swelling  Return to clinic in 1 year

## 2022-02-02 NOTE — Progress Notes (Signed)
Follow-up Visit   Date: 02/02/22   Vernon Bishop MRN: [id number] DOB: 05-13-1940   Interim History: Vernon Bishop is a 81 y.o. right-handed Caucasian male with PAF, hypertension, hyperlipidemia, s/p cervical and lumbar surgery returning to the clinic for follow-up of pontine stroke.  The patient was accompanied to the clinic by wife who also provides collateral information.    He is here for follow-up and reports to doing well.  He denies any new neurological symptoms.  He continues to have very mild numbness in the tongue and intermittent slurred speech, usually when tired.  He remains on Eliquis '5mg'$  BID.    His balance continues to be poor.  Fortunately, he has not had any falls.  He has chronic left knee pain.  Wife reports that he is noncompliant with using his walker and prefers to use his cane.    Medications:  Current Outpatient Medications on File Prior to Visit  Medication Sig Dispense Refill   acetaminophen (TYLENOL) 325 MG tablet Take 325-650 mg by mouth every 6 (six) hours as needed for mild pain or headache.     amLODipine (NORVASC) 5 MG tablet TAKE 1 TABLET BY MOUTH DAILY. 90 tablet 3   apixaban (ELIQUIS) 5 MG TABS tablet Take 1 tablet (5 mg total) by mouth 2 (two) times daily. 180 tablet 3   Cholecalciferol (VITAMIN D3 PO) Take 1 tablet by mouth daily.     clotrimazole-betamethasone (LOTRISONE) cream Apply 1 application. topically 2 (two) times daily. (Patient taking differently: Apply 1 application  topically daily as needed (to affected areas of the face).) 45 g 1   Cyanocobalamin (VITAMIN B-12 CR) 1000 MCG TBCR Take 1,000 mcg by mouth daily.       ezetimibe (ZETIA) 10 MG tablet Take 1 tablet (10 mg total) by mouth daily. 30 tablet 3   FLUoxetine (PROZAC) 40 MG capsule Take 1 capsule (40 mg total) by mouth daily. 90 capsule 2   furosemide (LASIX) 20 MG tablet TAKE 1 TABLET (20 MG TOTAL) BY MOUTH DAILY. (Patient taking differently: Take 20 mg by mouth daily as  needed for edema.) 30 tablet 3   gabapentin (NEURONTIN) 300 MG capsule Take 1 capsule (300 mg total) by mouth 3 (three) times daily. 270 capsule 3   olmesartan-hydrochlorothiazide (BENICAR HCT) 20-12.5 MG tablet Take 1 tablet by mouth daily. 90 tablet 3   oxyCODONE-acetaminophen (PERCOCET) 10-325 MG tablet Take 1 tablet by mouth every 6 (six) hours as needed for pain. 15 tablet 0   pravastatin (PRAVACHOL) 40 MG tablet Take 1 tablet (40 mg total) by mouth every evening. 90 tablet 3   PRESCRIPTION MEDICATION CPAP- At bedtime     propranolol (INDERAL) 80 MG tablet TAKE 1 TABLET BY MOUTH 3 TIMES DAILY. (Patient taking differently: Take 80 mg by mouth 3 (three) times daily.) 270 tablet 3   sodium chloride (OCEAN) 0.65 % SOLN nasal spray Place 1 spray into both nostrils as needed for congestion.     tetrahydrozoline 0.05 % ophthalmic solution Place 1 drop into both eyes 2 (two) times daily as needed (for redness).     tiZANidine (ZANAFLEX) 4 MG tablet TAKE 1 TABLET BY MOUTH EVERY 8 HOURS AS NEEDED FOR MUSCLE SPASMS. (Patient taking differently: Take 4 mg by mouth every 8 (eight) hours as needed for muscle spasms.) 60 tablet 1   traZODone (DESYREL) 50 MG tablet TAKE 1 TO 2 TABLETS BY MOUTH AT BEDTIME. 60 tablet 5   triamcinolone ointment (KENALOG) 0.1 %  Apply 1 Application topically 3 (three) times daily. 80 g 2   zinc gluconate 50 MG tablet Take 50 mg by mouth daily.     No current facility-administered medications on file prior to visit.    Allergies:  Allergies  Allergen Reactions   Abilify [Aripiprazole] Shortness Of Breath   Cymbalta [Duloxetine Hcl] Other (See Comments)    Constipation    Digoxin And Related Other (See Comments)    HR 41 (patient is unsure about this)   Prednisolone Swelling and Other (See Comments)    Edema, weight gain   Temazepam Other (See Comments)    "Made me feel crazy"   Other Rash and Other (See Comments)    Zomax/Zomepirac- Welts, also    Vital Signs:  BP  (!) 144/76   Pulse (!) 59   Ht 6' (1.829 m)   Wt 215 lb (97.5 kg)   SpO2 98%   BMI 29.16 kg/m   Neurological Exam: MENTAL STATUS including orientation to time, place, person, recent and remote memory, attention span and concentration, language, and fund of knowledge is normal.  Speech is not dysarthric.  CRANIAL NERVES:  No visual field defects.  Pupils equal round and reactive to light.  Normal conjugate, extra-ocular eye movements in all directions of gaze.  No ptosis.  Face is symmetric.   MOTOR:  Motor strength is 5/5 in all extremities, except 4/5 with left dorsiflexion, toe extension, eversion and inversion., 4/5 in intrinsic hand weakness.  Severe left FDI and ADM atrophy, and left lower leg atrophy.  No fasciculations or abnormal movements.  No pronator drift.  Tone is normal.  Significant edema of the left knee with warmth.   COORDINATION/GAIT:  Normal finger-to- nose-finger.  Intact rapid alternating movements bilaterally.  Gait appears unsteady and antalgic.  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 Chronic L5 radiculopathy affecting the left lower extremity, severe. Left ulnar axonal neuropathy,  nonlocalizable, severe. Sparse myopathic motor units were isolated to the rectus femoris muscle and too limited for definitive diagnosis.  Correlate clinically.   Mobile cardiac telemetry 13 days 04/25/2021 - 05/09/2021: Dominant rhythm: Sinus. HR 36-152 bpm. Avg HR 52 bpm. 52 episodes of atrial tachycardia/SVT, fastest at 135 bpm for 13 beats, longest for 15.9 secs at 100 bpm. 2.6% isolated SVE, <1% couplet/triplets. 1 episode of VT, at 105 bpm for 5 beats <1% isolated VE, couplet/triplets. No atrial fibrillation/atrial flutter/high grade AV block, sinus pause >3sec noted. 0 patient triggered events.    MRI brain 06/08/2021: Small acute and chronic pontine infarcts.   CTA head/neck  06/08/2021: 1. Negative CTA for large vessel occlusion or other emergent finding. 2. Moderate atheromatous disease involving the intracranial posterior circulation without correctable stenosis. The left PCA is severely attenuated and only faintly visible, but remains grossly patent to its distal aspect. Predominant fetal type origin of the right PCA. 3. Wide patency of the major arterial vasculature of the neck.   MRI lumbar spine 10/24/2020: 1. At L3-L4, severe canal and left subarticular recess stenosis with moderate to severe left foraminal stenosis. 2. At L2-L3, severe right subarticular recess stenosis, moderate canal stenosis, and mild-to-moderate right foraminal stenosis with right far lateral/extraforaminal disc closely approximating the exiting/exited right L2 nerve. 3. At L4-L5, grade 1 anterolisthesis with severe bilateral subarticular recess stenosis, mild to moderate canal stenosis, and moderate left foraminal stenosis. 4. At L5-S1, moderate left foraminal stenosis with left far lateral osteophyte contacting the  exiting left L5 nerve. 5. At L1-L2, moderate right subarticular and foraminal stenosis with mild to moderate canal stenosis.     Lab Results  Component Value Date   CHOL 157 06/09/2021   HDL 33 (L) 06/09/2021   LDLCALC 104 (H) 06/09/2021   TRIG 99 06/09/2021   CHOLHDL 4.8 06/09/2021   Lab Results  Component Value Date   HGBA1C 5.0 06/09/2021       IMPRESSION/PLAN: Left paramedian pontine infarct, etiology small vessel disease vs. Embolic (history of paroxysmal atrial fibrillation not on AC), manifesting with tongue numbness and dysarthria.  He was started on Eliquis and lipitor '40mg'$  daily at discharge.  Clinically, he has minimal deficits with mild central tongue numbness and intermittent dysarthria.   - Continue Eliquis '5mg'$  BID   - Continue pravastatin '40mg'$  and zetia '10mg'$  daily  - BP is mildly elevated, continue to follow at home and share with  PCP  Multifactorial unsteady gait due to multilevel lumbar spondylosis s/p surgery by Dr. Rolena Infante, left foot drop from L5 radiculopathy, and left knee pain. - Counseled patient to start to use a walker given his fall risk, especially that he is on anticoagulation  3.  Left knee edema  - Follow-up with [name]   Return to clinic in 1 year  Thank you for allowing me to participate in patient's care.  If I can answer any additional questions, I would be pleased to do so.    Sincerely,    Dreden Rivere K. Posey Pronto, DO

## 2022-02-03 LAB — TSH: TSH: 2.34 u[IU]/mL (ref 0.450–4.500)

## 2022-02-09 ENCOUNTER — Other Ambulatory Visit: Payer: Self-pay | Admitting: Internal Medicine

## 2022-02-17 DIAGNOSIS — R001 Bradycardia, unspecified: Secondary | ICD-10-CM | POA: Diagnosis not present

## 2022-02-24 DIAGNOSIS — R001 Bradycardia, unspecified: Secondary | ICD-10-CM | POA: Diagnosis not present

## 2022-03-12 ENCOUNTER — Other Ambulatory Visit: Payer: Self-pay

## 2022-03-25 ENCOUNTER — Telehealth: Payer: Self-pay

## 2022-03-25 ENCOUNTER — Other Ambulatory Visit: Payer: Self-pay | Admitting: Internal Medicine

## 2022-03-25 DIAGNOSIS — U071 COVID-19: Secondary | ICD-10-CM | POA: Insufficient documentation

## 2022-03-25 MED ORDER — MOLNUPIRAVIR EUA 200MG CAPSULE: 4.0000 | ORAL_CAPSULE | Freq: Two times a day (BID) | ORAL | 0 refills | Status: AC

## 2022-03-25 NOTE — Telephone Encounter (Signed)
Pt wife's calls and state pt has tested POS for COVID this morning. SXS started on 12/26/20123 such as  cough, headache, congestion and light headed, Temp now is 99.0 ,O2 is 98.  Please send antiviral medication to [facility]. Pt wife states please call to let her know if pt needs to stop taking any medications when taking the anti-viral.

## 2022-03-26 NOTE — Telephone Encounter (Signed)
Spoke with the pt and was able to confirm he has gotten medication and is doing okay for the most part.

## 2022-03-31 ENCOUNTER — Encounter: Payer: Self-pay | Admitting: Internal Medicine

## 2022-03-31 ENCOUNTER — Ambulatory Visit (INDEPENDENT_AMBULATORY_CARE_PROVIDER_SITE_OTHER): Payer: PPO | Admitting: Internal Medicine

## 2022-03-31 VITALS — BP 122/70 | HR 44 | Temp 98.9°F | Ht 72.0 in | Wt 215.0 lb

## 2022-03-31 DIAGNOSIS — U071 COVID-19: Secondary | ICD-10-CM | POA: Diagnosis not present

## 2022-03-31 DIAGNOSIS — M25562 Pain in left knee: Secondary | ICD-10-CM

## 2022-03-31 DIAGNOSIS — G4700 Insomnia, unspecified: Secondary | ICD-10-CM

## 2022-03-31 DIAGNOSIS — G8929 Other chronic pain: Secondary | ICD-10-CM | POA: Diagnosis not present

## 2022-03-31 DIAGNOSIS — E538 Deficiency of other specified B group vitamins: Secondary | ICD-10-CM

## 2022-03-31 MED ORDER — METHYLPREDNISOLONE 4 MG PO TBPK: ORAL_TABLET | ORAL | 0 refills | Status: DC

## 2022-03-31 MED ORDER — TRAZODONE HCL 50 MG PO TABS: 50.0000 mg | ORAL_TABLET | Freq: Every day | ORAL | 5 refills | Status: DC

## 2022-03-31 NOTE — Progress Notes (Signed)
Subjective:  Patient ID: Vernon Bishop, male    DOB: 27-Jun-1940  Age: 82 y.o. MRN: [id number]  CC: Follow-up (Left knee swollen and look at legs, is covid positive and on 7th day has finished antiviral medication)   HPI Vernon Bishop presents for recent COVID - doing better C/o knee swelling - left F/u insomnia  Outpatient Medications Prior to Visit  Medication Sig Dispense Refill   acetaminophen (TYLENOL) 325 MG tablet Take 325-650 mg by mouth every 6 (six) hours as needed for mild pain or headache.     amLODipine (NORVASC) 5 MG tablet TAKE 1 TABLET BY MOUTH DAILY. 90 tablet 3   apixaban (ELIQUIS) 5 MG TABS tablet Take 1 tablet (5 mg total) by mouth 2 (two) times daily. 180 tablet 3   Cholecalciferol (VITAMIN D3 PO) Take 1 tablet by mouth daily.     clotrimazole-betamethasone (LOTRISONE) cream Apply 1 application. topically 2 (two) times daily. (Patient taking differently: Apply 1 application  topically daily as needed (to affected areas of the face).) 45 g 1   Cyanocobalamin (VITAMIN B-12 CR) 1000 MCG TBCR Take 1,000 mcg by mouth daily.       ezetimibe (ZETIA) 10 MG tablet Take 1 tablet (10 mg total) by mouth daily. 30 tablet 3   FLUoxetine (PROZAC) 40 MG capsule Take 1 capsule (40 mg total) by mouth daily. 90 capsule 2   furosemide (LASIX) 20 MG tablet TAKE 1 TABLET (20 MG TOTAL) BY MOUTH DAILY. (Patient taking differently: Take 20 mg by mouth daily as needed for edema.) 30 tablet 3   gabapentin (NEURONTIN) 300 MG capsule Take 1 capsule (300 mg total) by mouth 3 (three) times daily. 270 capsule 3   olmesartan-hydrochlorothiazide (BENICAR HCT) 20-12.5 MG tablet Take 1 tablet by mouth daily. 90 tablet 3   oxyCODONE-acetaminophen (PERCOCET) 10-325 MG tablet Take 1 tablet by mouth every 6 (six) hours as needed for pain. 15 tablet 0   pravastatin (PRAVACHOL) 40 MG tablet Take 1 tablet (40 mg total) by mouth every evening. 90 tablet 3   PRESCRIPTION MEDICATION CPAP- At bedtime      propranolol (INDERAL) 80 MG tablet TAKE 1 TABLET BY MOUTH 3 TIMES DAILY. 270 tablet 3   sodium chloride (OCEAN) 0.65 % SOLN nasal spray Place 1 spray into both nostrils as needed for congestion.     tetrahydrozoline 0.05 % ophthalmic solution Place 1 drop into both eyes 2 (two) times daily as needed (for redness).     tiZANidine (ZANAFLEX) 4 MG tablet TAKE 1 TABLET BY MOUTH EVERY 8 HOURS AS NEEDED FOR MUSCLE SPASMS. (Patient taking differently: Take 4 mg by mouth every 8 (eight) hours as needed for muscle spasms.) 60 tablet 1   triamcinolone ointment (KENALOG) 0.1 % Apply 1 Application topically 3 (three) times daily. 80 g 2   zinc gluconate 50 MG tablet Take 50 mg by mouth daily.     traZODone (DESYREL) 50 MG tablet TAKE 1 TO 2 TABLETS BY MOUTH AT BEDTIME. 60 tablet 5   No facility-administered medications prior to visit.    ROS: Review of Systems  Constitutional:  Negative for appetite change, fatigue and unexpected weight change.  HENT:  Positive for congestion. Negative for nosebleeds, sneezing, sore throat and trouble swallowing.   Eyes:  Negative for itching and visual disturbance.  Respiratory:  Negative for cough and wheezing.   Cardiovascular:  Negative for chest pain, palpitations and leg swelling.  Gastrointestinal:  Negative for abdominal distention, blood in  stool, diarrhea and nausea.  Genitourinary:  Negative for frequency and hematuria.  Musculoskeletal:  Positive for arthralgias, back pain and gait problem. Negative for joint swelling and neck pain.  Skin:  Negative for rash.  Neurological:  Negative for dizziness, tremors, speech difficulty and weakness.  Psychiatric/Behavioral:  Negative for agitation, dysphoric mood and sleep disturbance. The patient is not nervous/anxious.     Objective:  BP 122/70 (BP Location: Left Arm, Patient Position: Sitting, Cuff Size: Normal)   Pulse (!) 44   Temp 98.9 F (37.2 C) (Oral)   Ht 6' (1.829 m)   Wt 215 lb (97.5 kg)   SpO2 97%    BMI 29.16 kg/m   BP Readings from Last 3 Encounters:  03/31/22 122/70  02/02/22 (!) 144/76  01/22/22 (!) 142/55    Wt Readings from Last 3 Encounters:  03/31/22 215 lb (97.5 kg)  02/02/22 215 lb (97.5 kg)  01/22/22 213 lb 12.8 oz (97 kg)    Physical Exam Constitutional:      General: He is not in acute distress.    Appearance: He is well-developed. He is obese.     Comments: NAD  Eyes:     Conjunctiva/sclera: Conjunctivae normal.     Pupils: Pupils are equal, round, and reactive to light.  Neck:     Thyroid: No thyromegaly.     Vascular: No JVD.  Cardiovascular:     Rate and Rhythm: Normal rate and regular rhythm.     Heart sounds: Normal heart sounds. No murmur heard.    No friction rub. No gallop.  Pulmonary:     Effort: Pulmonary effort is normal. No respiratory distress.     Breath sounds: Normal breath sounds. No wheezing or rales.  Chest:     Chest wall: No tenderness.  Abdominal:     General: Bowel sounds are normal. There is no distension.     Palpations: Abdomen is soft. There is no mass.     Tenderness: There is no abdominal tenderness. There is no guarding or rebound.  Musculoskeletal:        General: Tenderness present. Normal range of motion.     Cervical back: Normal range of motion.  Lymphadenopathy:     Cervical: No cervical adenopathy.  Skin:    General: Skin is warm and dry.     Findings: No rash.  Neurological:     Mental Status: He is alert and oriented to person, place, and time.     Cranial Nerves: No cranial nerve deficit.     Motor: No abnormal muscle tone.     Coordination: Coordination normal.     Gait: Gait abnormal.     Deep Tendon Reflexes: Reflexes are normal and symmetric.  Psychiatric:        Behavior: Behavior normal.        Thought Content: Thought content normal.        Judgment: Judgment normal.   Using a cane L knee is swollen, NT   Lab Results  Component Value Date   WBC 5.9 11/28/2021   HGB 13.0 11/28/2021    HCT 38.2 (L) 11/28/2021   PLT 178.0 11/28/2021   GLUCOSE 87 11/28/2021   CHOL 157 06/09/2021   TRIG 99 06/09/2021   HDL 33 (L) 06/09/2021   LDLCALC 104 (H) 06/09/2021   ALT 19 11/28/2021   AST 19 11/28/2021   NA 137 11/28/2021   K 3.7 11/28/2021   CL 101 11/28/2021   CREATININE 0.76 11/28/2021  BUN 18 11/28/2021   CO2 28 11/28/2021   TSH 2.340 02/02/2022   PSA 0.73 12/20/2019   INR 1.1 06/08/2021   HGBA1C 5.0 06/09/2021   MICROALBUR 3.9 12/20/2019    ECHOCARDIOGRAM COMPLETE BUBBLE STUDY  Result Date: 06/09/2021    ECHOCARDIOGRAM REPORT   Patient Name:   DEX BLAKELY Date of Exam: 06/09/2021 Medical Rec #:  [id number]       Height:       72.0 in Accession #:    [id number]      Weight:       212.0 lb Date of Birth:  1940-12-12        BSA:          2.184 m Patient Age:    28 years        BP:           135/55 mmHg Patient Gender: M               HR:           46 bpm. Exam Location:  Inpatient Procedure: 2D Echo, Color Doppler and Saline Contrast Bubble Study Indications:    I63.9 / 434.91 STROKE  History:        Patient has prior history of Echocardiogram examinations, most                 recent 01/15/2021. Arrythmias:Atrial Fibrillation and                 Bradycardia; Risk Factors:Diabetes, Hypertension and                 Dyslipidemia. DYSRHYTHMIA / MVP.  Sonographer:    Beryle Beams Referring Phys: [id number] [place] T TU IMPRESSIONS  1. Left ventricular ejection fraction, by estimation, is 55 to 60%. The left ventricle has normal function. The left ventricle has no regional wall motion abnormalities. There is mild concentric left ventricular hypertrophy. Left ventricular diastolic parameters are consistent with Grade II diastolic dysfunction (pseudonormalization).  2. Right ventricular systolic function is normal. The right ventricular size is normal. There is normal pulmonary artery systolic pressure. The estimated right ventricular systolic pressure is 20.9 mmHg.  3. Left atrial size was  severely dilated.  4. Right atrial size was mildly dilated.  5. The mitral valve is normal in structure. Trivial mitral valve regurgitation. No evidence of mitral stenosis.  6. The aortic valve is tricuspid. Aortic valve regurgitation is trivial. Aortic valve sclerosis is present, with no evidence of aortic valve stenosis. Aortic valve area, by VTI measures 2.27 cm. Aortic valve mean gradient measures 5.0 mmHg. Aortic valve Vmax measures 1.62 m/s.  7. The inferior vena cava is dilated in size with >50% respiratory variability, suggesting right atrial pressure of 8 mmHg. FINDINGS  Left Ventricle: Left ventricular ejection fraction, by estimation, is 55 to 60%. The left ventricle has normal function. The left ventricle has no regional wall motion abnormalities. The left ventricular internal cavity size was normal in size. There is  mild concentric left ventricular hypertrophy. Left ventricular diastolic parameters are consistent with Grade II diastolic dysfunction (pseudonormalization). Normal left ventricular filling pressure. Right Ventricle: The right ventricular size is normal. No increase in right ventricular wall thickness. Right ventricular systolic function is normal. There is normal pulmonary artery systolic pressure. The tricuspid regurgitant velocity is 2.48 m/s, and  with an assumed right atrial pressure of 8 mmHg, the estimated right ventricular systolic pressure is 47.0 mmHg. Left Atrium: Left atrial size was  severely dilated. Right Atrium: Right atrial size was mildly dilated. Pericardium: There is no evidence of pericardial effusion. Mitral Valve: The mitral valve is normal in structure. Trivial mitral valve regurgitation. No evidence of mitral valve stenosis. Tricuspid Valve: The tricuspid valve is normal in structure. Tricuspid valve regurgitation is trivial. No evidence of tricuspid stenosis. Aortic Valve: The aortic valve is tricuspid. Aortic valve regurgitation is trivial. Aortic valve sclerosis  is present, with no evidence of aortic valve stenosis. Aortic valve mean gradient measures 5.0 mmHg. Aortic valve peak gradient measures 10.5 mmHg.  Aortic valve area, by VTI measures 2.27 cm. Pulmonic Valve: The pulmonic valve was normal in structure. Pulmonic valve regurgitation is trivial. No evidence of pulmonic stenosis. Aorta: The aortic root is normal in size and structure. Venous: The inferior vena cava is dilated in size with greater than 50% respiratory variability, suggesting right atrial pressure of 8 mmHg. IAS/Shunts: No atrial level shunt detected by color flow Doppler. Agitated saline contrast was given intravenously to evaluate for intracardiac shunting.  LEFT VENTRICLE PLAX 2D LVIDd:         5.60 cm      Diastology LVIDs:         3.20 cm      LV e' medial:    5.44 cm/s LV PW:         1.20 cm      LV E/e' medial:  13.4 LV IVS:        1.40 cm      LV e' lateral:   6.84 cm/s LVOT diam:     2.00 cm      LV E/e' lateral: 10.6 LV SV:         92 LV SV Index:   42 LVOT Area:     3.14 cm  LV Volumes (MOD) LV vol d, MOD A2C: 106.0 ml LV vol d, MOD A4C: 111.0 ml LV vol s, MOD A2C: 42.5 ml LV vol s, MOD A4C: 49.6 ml LV SV MOD A2C:     63.5 ml LV SV MOD A4C:     111.0 ml LV SV MOD BP:      64.5 ml RIGHT VENTRICLE             IVC RV S prime:     21.90 cm/s  IVC diam: 2.10 cm TAPSE (M-mode): 3.6 cm LEFT ATRIUM              Index        RIGHT ATRIUM           Index LA diam:        4.20 cm  1.92 cm/m   RA Area:     24.70 cm LA Vol (A2C):   105.0 ml 48.08 ml/m  RA Volume:   80.90 ml  37.04 ml/m LA Vol (A4C):   97.2 ml  44.51 ml/m LA Biplane Vol: 104.0 ml 47.62 ml/m  AORTIC VALVE                     PULMONIC VALVE AV Area (Vmax):    2.25 cm      PV Vmax:       0.55 m/s AV Area (Vmean):   2.13 cm      PV Vmean:      34.000 cm/s AV Area (VTI):     2.27 cm      PV VTI:        0.130 m AV Vmax:  162.00 cm/s   PV Peak grad:  1.2 mmHg AV Vmean:          105.000 cm/s  PV Mean grad:  1.0 mmHg AV VTI:             0.407 m AV Peak Grad:      10.5 mmHg AV Mean Grad:      5.0 mmHg LVOT Vmax:         116.00 cm/s LVOT Vmean:        71.100 cm/s LVOT VTI:          0.294 m LVOT/AV VTI ratio: 0.72  AORTA Ao Root diam: 2.80 cm Ao Asc diam:  3.50 cm MITRAL VALVE               TRICUSPID VALVE MV Area (PHT): 3.53 cm    TR Peak grad:   24.6 mmHg MV Decel Time: 215 msec    TR Mean grad:   16.0 mmHg MV E velocity: 72.80 cm/s  TR Vmax:        248.00 cm/s MV A velocity: 80.60 cm/s  TR Vmean:       190.0 cm/s MV E/A ratio:  0.90                            SHUNTS                            Systemic VTI:  0.29 m                            Systemic Diam: 2.00 cm Fransico Him MD Electronically signed by Fransico Him MD Signature Date/Time: 06/09/2021/3:22:21 PM    Final    CT ANGIO HEAD NECK W WO CM  Result Date: 06/08/2021 CLINICAL DATA:  Follow-up examination for acute stroke. EXAM: CT ANGIOGRAPHY HEAD AND NECK TECHNIQUE: Multidetector CT imaging of the head and neck was performed using the standard protocol during bolus administration of intravenous contrast. Multiplanar CT image reconstructions and MIPs were obtained to evaluate the vascular anatomy. Carotid stenosis measurements (when applicable) are obtained utilizing NASCET criteria, using the distal internal carotid diameter as the denominator. RADIATION DOSE REDUCTION: This exam was performed according to the departmental dose-optimization program which includes automated exposure control, adjustment of the mA and/or kV according to patient size and/or use of iterative reconstruction technique. CONTRAST:  120m OMNIPAQUE IOHEXOL 350 MG/ML SOLN COMPARISON:  Prior MRI from earlier the same day. FINDINGS: CT HEAD FINDINGS Brain: Cerebral volume within normal limits. Previously identified small acute and chronic pontine infarcts noted, better evaluated on prior MRI. No acute intracranial hemorrhage. No other acute large vessel territory infarct. No mass lesion or midline shift. No  hydrocephalus or extra-axial fluid collection. Vascular: No hyperdense vessel. Scattered vascular calcifications noted within the carotid siphons. Skull: Scalp soft tissues and calvarium within normal limits. Sinuses: Mild mucosal thickening noted within the ethmoidal air cells. Paranasal sinuses and mastoid air cells are otherwise clear. Orbits: Globes and orbital soft tissues demonstrate no acute finding. Review of the MIP images confirms the above findings CTA NECK FINDINGS Aortic arch: Visualized aortic arch normal caliber with normal branch pattern. No stenosis about the origin of the great vessels. Right carotid system: Right common and internal carotid arteries widely patent without stenosis, dissection or occlusion. Left carotid system: Left common and internal carotid arteries  widely patent without stenosis, dissection or occlusion. Vertebral arteries: Both vertebral arteries arise from the subclavian arteries. No proximal subclavian artery stenosis. Both vertebral arteries widely patent without stenosis, dissection or occlusion. Skeleton: No discrete or worrisome osseous lesions. Prior ACDF at C3 through C7. 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 Extensive bulky osteophytic spurring within the visualized upper thoracic spine, suggesting DISH. Other neck: No other acute soft tissue abnormality within the neck. Upper chest: Visualized upper chest demonstrates no acute finding. Review of the MIP images confirms the above findings CTA HEAD FINDINGS Anterior circulation: Both internal carotid arteries patent to the termini without stenosis or other abnormality. Left A1 widely patent. Right A1 hypoplastic and/or absent, accounting for the diminutive right ICA is compared to the left. Normal anterior communicating artery complex. Anterior cerebral arteries patent to their distal aspects without significant stenosis. Normal in stenosis or occlusion. Normal MCA bifurcations. Distal MCA branches perfused and symmetric.  Posterior circulation: Both V4 segments patent to the vertebrobasilar junction without significant stenosis. Left vertebral artery dominant. Left PICA patent. Right PICA not seen. Moderate atheromatous irregularity within the basilar artery without high-grade stenosis. Superior cerebellar arteries patent bilaterally. Predominant fetal type origin of the right PCA supplied via a robust right posterior communicating artery. Tiny 2 mm outpouching at the proximal right P2 segment favored to reflect a vascular infundibulum related to a hypoplastic right P1 segment. Moderate atheromatous irregularity throughout the right PCA without high-grade stenosis. The contralateral left PCA is primarily supplied via the basilar and is severely attenuated and only faintly visible. Left PCA appears to remain grossly patent to its distal aspect. Venous sinuses: Patent allowing for timing the contrast bolus. Anatomic variants: Predominant fetal type origin of the right PCA. Hypoplastic/absent right A1 with the ACAs supplied via the left carotid artery system. No aneurysm. Review of the MIP images confirms the above findings IMPRESSION: 1. Negative CTA for large vessel occlusion or other emergent finding. 2. Moderate atheromatous disease involving the intracranial posterior circulation without correctable stenosis. The left PCA is severely attenuated and only faintly visible, but remains grossly patent to its distal aspect. Predominant fetal type origin of the right PCA. 3. Wide patency of the major arterial vasculature of the neck. Electronically Signed   By: Jeannine Boga M.D.   On: 06/08/2021 21:25   MR BRAIN W WO CONTRAST  Result Date: 06/08/2021 CLINICAL DATA:  Tongue paralysis/weakness (CN 12).  Dysarthria. EXAM: MRI HEAD WITHOUT AND WITH CONTRAST TECHNIQUE: Multiplanar, multiecho pulse sequences of the brain and surrounding structures were obtained without and with intravenous contrast. CONTRAST:  9.46m GADAVIST  GADOBUTROL 1 MMOL/ML IV SOLN COMPARISON:  Head CT 02/27/2005 FINDINGS: Brain: A 7 mm acute infarct is present in the left paramedian pons. A separate, smaller chronic infarct is noted more laterally in the pons on the left. T2 hyperintensities in the cerebral white matter bilaterally are nonspecific but compatible with minimal chronic small vessel ischemic disease, not advanced for age. No intracranial hemorrhage, mass, midline shift, or extra-axial fluid collection is identified. There is no abnormal intracranial enhancement. There is mild generalized cerebral atrophy which is within normal limits for age. Vascular: Major intracranial vascular flow voids are preserved. Skull and upper cervical spine: Unremarkable bone marrow signal. Sinuses/Orbits: Bilateral cataract extraction. Clear paranasal sinuses. Trace right mastoid fluid. Other: None. IMPRESSION: Small acute and chronic pontine infarcts. Electronically Signed   By: ALogan BoresM.D.   On: 06/08/2021 18:38    Assessment & Plan:   Problem  List Items Addressed This Visit     Vitamin B12 deficiency    On B12      Left knee pain    OA w/swelling; NT Medrol pac F/u w/Dr Alvan Dame      Insomnia    Cont on Trazodone      COVID-19 - Primary    Improving Finished molnupiravir         Meds ordered this encounter  Medications   methylPREDNISolone (MEDROL DOSEPAK) 4 MG TBPK tablet    Sig: As directed    Dispense:  21 tablet    Refill:  0   traZODone (DESYREL) 50 MG tablet    Sig: Take 1-2 tablets (50-100 mg total) by mouth at bedtime.    Dispense:  60 tablet    Refill:  5      Follow-up: Return in about 3 months (around 06/30/2022) for a follow-up visit.  Walker Kehr, MD

## 2022-03-31 NOTE — Assessment & Plan Note (Signed)
OA w/swelling; NT Medrol pac F/u w/Dr Alvan Dame

## 2022-03-31 NOTE — Assessment & Plan Note (Signed)
Cont on Trazodone

## 2022-03-31 NOTE — Assessment & Plan Note (Signed)
On B12 

## 2022-03-31 NOTE — Assessment & Plan Note (Signed)
Improving Finished molnupiravir

## 2022-03-31 NOTE — Patient Instructions (Signed)
Isolate for 5 days, then wear a mask for 5 days per CDC.

## 2022-04-22 ENCOUNTER — Emergency Department (HOSPITAL_COMMUNITY)
Admission: EM | Admit: 2022-04-22 | Discharge: 2022-04-22 | Disposition: A | Discharge status: Home / Self Care | Payer: PPO | Attending: Emergency Medicine | Admitting: Emergency Medicine

## 2022-04-22 ENCOUNTER — Emergency Department (HOSPITAL_COMMUNITY): Payer: PPO

## 2022-04-22 ENCOUNTER — Other Ambulatory Visit: Payer: Self-pay

## 2022-04-22 ENCOUNTER — Telehealth: Payer: Self-pay | Admitting: Internal Medicine

## 2022-04-22 ENCOUNTER — Encounter (HOSPITAL_COMMUNITY): Payer: Self-pay | Admitting: Neurology

## 2022-04-22 DIAGNOSIS — E119 Type 2 diabetes mellitus without complications: Secondary | ICD-10-CM | POA: Diagnosis not present

## 2022-04-22 DIAGNOSIS — R519 Headache, unspecified: Secondary | ICD-10-CM | POA: Diagnosis not present

## 2022-04-22 DIAGNOSIS — R0902 Hypoxemia: Secondary | ICD-10-CM | POA: Diagnosis not present

## 2022-04-22 DIAGNOSIS — R001 Bradycardia, unspecified: Secondary | ICD-10-CM | POA: Diagnosis not present

## 2022-04-22 DIAGNOSIS — Z79899 Other long term (current) drug therapy: Secondary | ICD-10-CM | POA: Insufficient documentation

## 2022-04-22 DIAGNOSIS — G4489 Other headache syndrome: Secondary | ICD-10-CM | POA: Diagnosis not present

## 2022-04-22 DIAGNOSIS — J9811 Atelectasis: Secondary | ICD-10-CM | POA: Diagnosis not present

## 2022-04-22 DIAGNOSIS — R55 Syncope and collapse: Secondary | ICD-10-CM | POA: Insufficient documentation

## 2022-04-22 DIAGNOSIS — Z8673 Personal history of transient ischemic attack (TIA), and cerebral infarction without residual deficits: Secondary | ICD-10-CM | POA: Diagnosis not present

## 2022-04-22 DIAGNOSIS — E871 Hypo-osmolality and hyponatremia: Secondary | ICD-10-CM | POA: Diagnosis not present

## 2022-04-22 DIAGNOSIS — R479 Unspecified speech disturbances: Secondary | ICD-10-CM | POA: Insufficient documentation

## 2022-04-22 DIAGNOSIS — G319 Degenerative disease of nervous system, unspecified: Secondary | ICD-10-CM | POA: Diagnosis not present

## 2022-04-22 DIAGNOSIS — R531 Weakness: Secondary | ICD-10-CM | POA: Diagnosis not present

## 2022-04-22 DIAGNOSIS — I10 Essential (primary) hypertension: Secondary | ICD-10-CM | POA: Insufficient documentation

## 2022-04-22 DIAGNOSIS — Z8249 Family history of ischemic heart disease and other diseases of the circulatory system: Secondary | ICD-10-CM | POA: Diagnosis not present

## 2022-04-22 DIAGNOSIS — Z5321 Procedure and treatment not carried out due to patient leaving prior to being seen by health care provider: Secondary | ICD-10-CM | POA: Insufficient documentation

## 2022-04-22 DIAGNOSIS — Z87891 Personal history of nicotine dependence: Secondary | ICD-10-CM | POA: Insufficient documentation

## 2022-04-22 DIAGNOSIS — Z7901 Long term (current) use of anticoagulants: Secondary | ICD-10-CM | POA: Diagnosis not present

## 2022-04-22 DIAGNOSIS — I959 Hypotension, unspecified: Secondary | ICD-10-CM | POA: Diagnosis not present

## 2022-04-22 DIAGNOSIS — R42 Dizziness and giddiness: Secondary | ICD-10-CM | POA: Diagnosis present

## 2022-04-22 DIAGNOSIS — I509 Heart failure, unspecified: Secondary | ICD-10-CM | POA: Diagnosis not present

## 2022-04-22 DIAGNOSIS — I110 Hypertensive heart disease with heart failure: Secondary | ICD-10-CM | POA: Insufficient documentation

## 2022-04-22 HISTORY — DX: Cerebral infarction, unspecified: I63.9

## 2022-04-22 LAB — CBC WITH DIFFERENTIAL/PLATELET
Abs Immature Granulocytes: 0.02 10*3/uL (ref 0.00–0.07)
Basophils Absolute: 0 10*3/uL (ref 0.0–0.1)
Basophils Relative: 1 %
Eosinophils Absolute: 0.1 10*3/uL (ref 0.0–0.5)
Eosinophils Relative: 2 %
HCT: 34.1 % — ABNORMAL LOW (ref 39.0–52.0)
Hemoglobin: 11.5 g/dL — ABNORMAL LOW (ref 13.0–17.0)
Immature Granulocytes: 0 %
Lymphocytes Relative: 19 %
Lymphs Abs: 1.1 10*3/uL (ref 0.7–4.0)
MCH: 31.1 pg (ref 26.0–34.0)
MCHC: 33.7 g/dL (ref 30.0–36.0)
MCV: 92.2 fL (ref 80.0–100.0)
Monocytes Absolute: 0.6 10*3/uL (ref 0.1–1.0)
Monocytes Relative: 10 %
Neutro Abs: 4.1 10*3/uL (ref 1.7–7.7)
Neutrophils Relative %: 68 %
Platelets: 171 10*3/uL (ref 150–400)
RBC: 3.7 MIL/uL — ABNORMAL LOW (ref 4.22–5.81)
RDW: 13.4 % (ref 11.5–15.5)
WBC: 6 10*3/uL (ref 4.0–10.5)
nRBC: 0 % (ref 0.0–0.2)

## 2022-04-22 LAB — BRAIN NATRIURETIC PEPTIDE: B Natriuretic Peptide: 178.1 pg/mL — ABNORMAL HIGH (ref 0.0–100.0)

## 2022-04-22 LAB — COMPREHENSIVE METABOLIC PANEL
ALT: 18 U/L (ref 0–44)
AST: 21 U/L (ref 15–41)
Albumin: 3.1 g/dL — ABNORMAL LOW (ref 3.5–5.0)
Alkaline Phosphatase: 72 U/L (ref 38–126)
Anion gap: 8 (ref 5–15)
BUN: 16 mg/dL (ref 8–23)
CO2: 24 mmol/L (ref 22–32)
Calcium: 8.4 mg/dL — ABNORMAL LOW (ref 8.9–10.3)
Chloride: 99 mmol/L (ref 98–111)
Creatinine, Ser: 0.89 mg/dL (ref 0.61–1.24)
GFR, Estimated: >60 mL/min (ref >60)
Glucose, Bld: 76 mg/dL (ref 70–99)
Potassium: 4.4 mmol/L (ref 3.5–5.1)
Sodium: 131 mmol/L — ABNORMAL LOW (ref 135–145)
Total Bilirubin: 0.6 mg/dL (ref 0.3–1.2)
Total Protein: 5.9 g/dL — ABNORMAL LOW (ref 6.5–8.1)

## 2022-04-22 LAB — TROPONIN I (HIGH SENSITIVITY)
Troponin I (High Sensitivity): 6 ng/L (ref <18)
Troponin I (High Sensitivity): 7 ng/L (ref <18)

## 2022-04-22 LAB — I-STAT CHEM 8, ED
BUN: 18 mg/dL (ref 8–23)
Calcium, Ion: 1.11 mmol/L — ABNORMAL LOW (ref 1.15–1.40)
Chloride: 99 mmol/L (ref 98–111)
Creatinine, Ser: 0.9 mg/dL (ref 0.61–1.24)
Glucose, Bld: 76 mg/dL (ref 70–99)
HCT: 32 % — ABNORMAL LOW (ref 39.0–52.0)
Hemoglobin: 10.9 g/dL — ABNORMAL LOW (ref 13.0–17.0)
Potassium: 4.5 mmol/L (ref 3.5–5.1)
Sodium: 134 mmol/L — ABNORMAL LOW (ref 135–145)
TCO2: 24 mmol/L (ref 22–32)

## 2022-04-22 LAB — PROTIME-INR
INR: 1.2 (ref 0.8–1.2)
Prothrombin Time: 15.3 seconds — ABNORMAL HIGH (ref 11.4–15.2)

## 2022-04-22 LAB — ETHANOL: Alcohol, Ethyl (B): <10 mg/dL (ref <10)

## 2022-04-22 LAB — CBG MONITORING, ED: Glucose-Capillary: 75 mg/dL (ref 70–99)

## 2022-04-22 NOTE — ED Provider Notes (Signed)
[facility] Provider Note  CSN: [id number] Arrival date & time: 04/22/22 1419  Chief Complaint(s) Bradycardia  HPI Vernon Bishop is a 82 y.o. male with a history of chronic bradycardia, hypertension, hyperlipidemia, prior stroke presenting to the emergency department with weakness.  Per patient and wife, patient was in her normal state of health this morning, felt some muscle spasms and took full dose of tizanidine.  Normally takes half dose.  Around 1230, seemed to have speech difficulties, became pale, lightheaded, lethargic.  Wife called EMS who found patient bradycardic and hypotensive to the 70s.  They transported patient to the emergency department.  Currently the patient reports a mild headache.  He denies any nausea, vomiting, chest pain, abdominal pain, diarrhea.  He denies noticing any numbness, tingling, weakness.    Past Medical History Past Medical History:  Diagnosis Date   Arthralgia    Atrial fibrillation ([place])    1992   Benign prostatic hyperplasia    Bradycardia    "my doctor says I have bradycardia, my heart rate is always in the 40s"   DDD (degenerative disc disease)    Depression    Diabetes mellitus    type II  - PT STATES NOT ON ANY DIABETIC MEDICINES   Dysrhythmia    aFIB   Dyssomnia    ED (erectile dysfunction)    Hyperlipidemia    Hypertension    Hypogonadism male    Joint pain    LBP (low back pain)    Multiple actinic keratoses    Muscle weakness    Upper limb   MVP (mitral valve prolapse)    Neck pain    OSA on CPAP    cpap   Osteoarthritis of hip    Osteoarthritis, hand    SEVERE LOWER BACK PAIN, AND RESTLESS LEG SYNDROME   Otalgia of right ear    Pain in left knee    Pain in thoracic spine    Palpitations    PTSD (post-traumatic stress disorder)    Shoulder pain    Subjective visual disturbance    Swallowing problem    HX OF PILLS GETTING "STUCK" IN THROAT AT TIMES   Swelling    Type 2  diabetes mellitus ([place])    pt states he has never had it   Vitamin D deficiency    Patient Active Problem List   Diagnosis Date Noted   COVID-19 03/25/2022   Eczema 11/28/2021   Acquired thrombophilia ([place]) 10/28/2021   Thoracic radiculopathy 10/28/2021   Anticoagulant long-term use 09/03/2021   Dysarthria    Cerebrovascular accident (CVA) ([place]) 06/08/2021   HTN (hypertension) 06/08/2021   Paroxysmal atrial fibrillation ([place]) 06/08/2021   Postoperative anemia 06/03/2021   S/P lumbar fusion 03/12/2021   History of atrial fibrillation 01/22/2021   Pulmonary hypertension, unspecified ([place]) 01/20/2021   Chronic back pain 01/07/2021   S/P hip replacement, left 05/23/2019   Ear pain, right 04/05/2019   Leg weakness, bilateral 02/01/2019   Arm weakness 04/07/2018   BPH (benign prostatic hyperplasia) 04/23/2017   Hip pain, chronic, left 05/06/2016   Delusional thoughts ([place]) 05/06/2016   CTS (carpal tunnel syndrome) 07/29/2015   Ulnar neuropathy 07/29/2015   Generalized anxiety disorder 07/29/2015   Pre-op evaluation 01/28/2015   Insomnia 04/13/2014   Left knee pain 10/10/2013   Delusional disorder ([place]) 10/10/2013   Overweight (BMI 25.0-29.9) 03/15/2013   S/P total hip arthroplasty 03/14/2013   Hip osteoarthritis 03/06/2013   Bradycardia  10/09/2012   Sinusitis, acute 06/29/2012   Actinic keratoses 05/24/2012   Hyponatremia 10/23/2011   Edema 10/23/2011   Shoulder pain, acute 07/13/2011   Arthralgia 06/08/2011   Cervical pain 01/28/2011   Neoplasm of uncertain behavior of skin 06/16/2010   Sinus bradycardia 03/17/2010   SINUSITIS, ACUTE 12/11/2009   PAIN IN THORACIC SPINE 04/26/2009   TOBACCO USE, QUIT 02/19/2009   Tobacco use 02/19/2009   OSA on CPAP 11/15/2008   Hypogonadism male 05/02/2008   Vitamin B12 deficiency 08/26/2007   VISUAL CHANGES 08/25/2007   OTHER CONVULSIONS 08/25/2007   SLEEP DEPRIVATION 08/25/2007   Palpitations 08/25/2007   HIP PAIN 04/29/2007    Osteoarthritis 02/15/2007   Dyslipidemia 02/14/2007   Depression 02/14/2007   Atrial fibrillation ([place]) 02/14/2007   Seborrheic dermatitis 02/14/2007   Diabetes mellitus type 2, diet-controlled ([place]) 01/11/2007   Essential hypertension 01/11/2007   LOW BACK PAIN 01/11/2007   Home Medication(s) Prior to Admission medications   Medication Sig Start Date End Date Taking? Authorizing Provider  acetaminophen (TYLENOL) 325 MG tablet Take 325-650 mg by mouth every 6 (six) hours as needed for mild pain or headache.    [provider]  amLODipine (NORVASC) 5 MG tablet TAKE 1 TABLET BY MOUTH DAILY. 09/19/21   [name], [name], MD  apixaban (ELIQUIS) 5 MG TABS tablet Take 1 tablet (5 mg total) by mouth 2 (two) times daily. 06/23/21   Patwardhan, Reynold Bowen, MD  Cholecalciferol (VITAMIN D3 PO) Take 1 tablet by mouth daily.    [provider]  clotrimazole-betamethasone (LOTRISONE) cream Apply 1 application. topically 2 (two) times daily. Patient taking differently: Apply 1 application  topically daily as needed (to affected areas of the face). 06/03/21   [name], [name], MD  Cyanocobalamin (VITAMIN B-12 CR) 1000 MCG TBCR Take 1,000 mcg by mouth daily.      [provider]  ezetimibe (ZETIA) 10 MG tablet Take 1 tablet (10 mg total) by mouth daily. 01/22/22 01/17/23  Patwardhan, Reynold Bowen, MD  FLUoxetine (PROZAC) 40 MG capsule Take 1 capsule (40 mg total) by mouth daily. 11/28/21   [name], [name], MD  furosemide (LASIX) 20 MG tablet TAKE 1 TABLET (20 MG TOTAL) BY MOUTH DAILY. Patient taking differently: Take 20 mg by mouth daily as needed for edema. 05/19/21   [name], [name], MD  gabapentin (NEURONTIN) 300 MG capsule Take 1 capsule (300 mg total) by mouth 3 (three) times daily. 11/28/21   [name], [name], MD  methylPREDNISolone (MEDROL DOSEPAK) 4 MG TBPK tablet As directed 03/31/22   [name], [name], MD  olmesartan-hydrochlorothiazide (BENICAR HCT) 20-12.5 MG  tablet Take 1 tablet by mouth daily. 11/28/21   [name], [name], MD  oxyCODONE-acetaminophen (PERCOCET) 10-325 MG tablet Take 1 tablet by mouth every 6 (six) hours as needed for pain. 11/28/21 11/28/22  [name], [name], MD  pravastatin (PRAVACHOL) 40 MG tablet Take 1 tablet (40 mg total) by mouth every evening. 01/22/22   Patwardhan, Reynold Bowen, MD  PRESCRIPTION MEDICATION CPAP- At bedtime    [provider]  propranolol (INDERAL) 80 MG tablet TAKE 1 TABLET BY MOUTH 3 TIMES DAILY. 02/09/22   [name], [name], MD  sodium chloride (OCEAN) 0.65 % SOLN nasal spray Place 1 spray into both nostrils as needed for congestion.    [provider]  tetrahydrozoline 0.05 % ophthalmic solution Place 1 drop into both eyes 2 (two) times daily as needed (for redness).    [provider]  tiZANidine (  ZANAFLEX) 4 MG tablet TAKE 1 TABLET BY MOUTH EVERY 8 HOURS AS NEEDED FOR MUSCLE SPASMS. Patient taking differently: Take 4 mg by mouth every 8 (eight) hours as needed for muscle spasms. 04/19/21   [name], [name], MD  traZODone (DESYREL) 50 MG tablet Take 1-2 tablets (50-100 mg total) by mouth at bedtime. 03/31/22   [name], [name], MD  triamcinolone ointment (KENALOG) 0.1 % Apply 1 Application topically 3 (three) times daily. 11/28/21   [name], [name], MD  zinc gluconate 50 MG tablet Take 50 mg by mouth daily.    [provider]                                                                                                                                    Past Surgical History Past Surgical History:  Procedure Laterality Date   ABDOMINAL EXPOSURE N/A 03/12/2021   Procedure: ABDOMINAL EXPOSURE;  Surgeon: Marty Heck, MD;  Location: [facility];  Service: Vascular;  Laterality: N/A;   ANTERIOR LAT LUMBAR FUSION N/A 03/12/2021   Procedure: ANTERIOR LATERAL LUMBAR FUSION 2 LEVELS;  Surgeon: Melina Schools, MD;  Location: [facility];  Service: Orthopedics;  Laterality:  N/A;   APPENDECTOMY     BACK SURGERY  1987   CATARACT EXTRACTION     CERVICAL LAMINECTOMY  2004   [name], rod was placed- HAS ROM LIMITATIONS   CHOLECYSTECTOMY     COLONOSCOPY     JOINT REPLACEMENT     KNEE SURGERY     left knee/arthroscopic   TOTAL HIP ARTHROPLASTY Right 03/14/2013   Procedure: RIGHT TOTAL HIP ARTHROPLASTY ANTERIOR APPROACH;  Surgeon: [name], MD;  Location: [facility];  Service: Orthopedics;  Laterality: Right;   TOTAL HIP ARTHROPLASTY Left 05/23/2019   Procedure: TOTAL HIP ARTHROPLASTY ANTERIOR APPROACH;  Surgeon: Paralee Cancel, MD;  Location: [facility];  Service: Orthopedics;  Laterality: Left;  70 mins   Family History Family History  Problem Relation Age of Onset   Allergies Mother    Stroke Mother        Clotting disorders   Heart disease Mother    Heart disease Father    Heart attack Father 54   Heart disease Brother    Rheum arthritis Paternal Grandmother    Other Paternal Grandmother        Rheumatism   Coronary artery disease Other    Diabetes Other    Hypertension Other    Esophageal cancer Maternal Aunt    Colon cancer Neg Hx    Rectal cancer Neg Hx    Stomach cancer Neg Hx     Social History Social History   Tobacco Use   Smoking status: Former    Packs/day: 0.50    Years: 12.00    Total pack years: 6.00    Types: Cigarettes    Quit date: 03/31/1971    Years since quitting: 51.0   Smokeless  tobacco: Never   Tobacco comments:    Started at age 62  Vaping Use   Vaping Use: Never used  Substance Use Topics   Alcohol use: Yes    Alcohol/week: 2.0 standard drinks of alcohol    Types: 2 Cans of beer per week    Comment: occasionally   Drug use: No   Allergies Abilify [aripiprazole], Cymbalta [duloxetine hcl], Digoxin and related, Prednisolone, Temazepam, and Other  Review of Systems Review of Systems  All other systems reviewed and are negative.   Physical Exam Vital Signs  I have reviewed the triage vital signs BP (!)  115/52   Pulse (!) 41   Temp 98.1 F (36.7 C)   Resp 19   Ht 6' (1.829 m)   Wt 97.5 kg   SpO2 100%   BMI 29.16 kg/m  Physical Exam Vitals and nursing note reviewed.  Constitutional:      General: He is not in acute distress.    Appearance: Normal appearance.  HENT:     Mouth/Throat:     Mouth: Mucous membranes are moist.  Eyes:     Conjunctiva/sclera: Conjunctivae normal.  Cardiovascular:     Rate and Rhythm: Normal rate and regular rhythm.  Pulmonary:     Effort: Pulmonary effort is normal. No respiratory distress.     Breath sounds: Normal breath sounds.  Abdominal:     General: Abdomen is flat.     Palpations: Abdomen is soft.     Tenderness: There is no abdominal tenderness.  Musculoskeletal:     Right lower leg: No edema.     Left lower leg: No edema.  Skin:    General: Skin is warm and dry.     Capillary Refill: Capillary refill takes less than 2 seconds.  Neurological:     Mental Status: He is alert and oriented to person, place, and time. Mental status is at baseline.     Comments: Cranial nerves II through XII intact, very mild dysarthria, no pronator drift, strength 5 out of 5 in the bilateral upper and lower extremities, no numbness or tingling to light touch, no dysmetria on finger-nose-finger testing  Psychiatric:        Mood and Affect: Mood normal.        Behavior: Behavior normal.     ED Results and Treatments Labs (all labs ordered are listed, but only abnormal results are displayed) Labs Reviewed  COMPREHENSIVE METABOLIC PANEL - Abnormal; Notable for the following components:      Result Value   Sodium 131 (*)    Calcium 8.4 (*)    Total Protein 5.9 (*)    Albumin 3.1 (*)    All other components within normal limits  CBC WITH DIFFERENTIAL/PLATELET - Abnormal; Notable for the following components:   RBC 3.70 (*)    Hemoglobin 11.5 (*)    HCT 34.1 (*)    All other components within normal limits  PROTIME-INR - Abnormal; Notable for the  following components:   Prothrombin Time 15.3 (*)    All other components within normal limits  I-STAT CHEM 8, ED - Abnormal; Notable for the following components:   Sodium 134 (*)    Calcium, Ion 1.11 (*)    Hemoglobin 10.9 (*)    HCT 32.0 (*)    All other components within normal limits  BRAIN NATRIURETIC PEPTIDE  URINALYSIS, ROUTINE W REFLEX MICROSCOPIC  ETHANOL  RAPID URINE DRUG SCREEN, HOSP PERFORMED  CBG MONITORING, ED  TROPONIN I (  HIGH SENSITIVITY)  TROPONIN I (HIGH SENSITIVITY)                                                                                                                          Radiology CT Head Wo Contrast  Result Date: 04/22/2022 CLINICAL DATA:  Headache.) EXAM: CT HEAD WITHOUT CONTRAST TECHNIQUE: Contiguous axial images were obtained from the base of the skull through the vertex without intravenous contrast. RADIATION DOSE REDUCTION: This exam was performed according to the departmental dose-optimization program which includes automated exposure control, adjustment of the mA and/or kV according to patient size and/or use of iterative reconstruction technique. COMPARISON:  06/08/2021 FINDINGS: Brain: There is periventricular white matter decreased attenuation consistent with small vessel ischemic changes. Gray-white differentiation is preserved. No acute intracranial hemorrhage, mass effect or shift. No hydrocephalus. Vascular: No hyperdense vessel or unexpected calcification. Skull: Normal. Negative for fracture or focal lesion. Sinuses/Orbits: No acute finding. IMPRESSION: Periventricular white matter changes consistent with chronic small vessel ischemia. No acute intracranial process identified. Electronically Signed   By: Sammie Bench M.D.   On: 04/22/2022 15:15   DG Chest Port 1 View  Result Date: 04/22/2022 CLINICAL DATA:  Weakness EXAM: PORTABLE CHEST 1 VIEW COMPARISON:  Chest x-ray dated January 10, 2021 FINDINGS: Cardiac and mediastinal contours  within normal limits for AP technique. Left costophrenic angle is excluded from the field of view. Bibasilar atelectasis. No large pleural effusion or pneumothorax. IMPRESSION: No active disease. Electronically Signed   By: Yetta Glassman M.D.   On: 04/22/2022 15:07    Pertinent labs & imaging results that were available during my care of the patient were reviewed by me and considered in my medical decision making (see MDM for details).  Medications Ordered in ED Medications - No data to display                                                                                                                                   Procedures .Critical Care  Performed by: Cristie Hem, MD Authorized by: Cristie Hem, MD   Critical care provider statement:    Critical care time (minutes):  30   Critical care was necessary to treat or prevent imminent or life-threatening deterioration of the following conditions:  CNS failure or compromise   Critical care was time spent personally by me on the following activities:  Development of treatment  plan with patient or surrogate, discussions with consultants, evaluation of patient's response to treatment, examination of patient, ordering and review of laboratory studies, ordering and review of radiographic studies, ordering and performing treatments and interventions, pulse oximetry, re-evaluation of patient's condition and review of old charts   (including critical care time)  Medical Decision Making / ED Course   MDM:  82 year old male presenting to the emergency department after episode of low blood pressure.  Patient well-appearing on arrival to the emergency department with improvement in blood pressure.  Initially did not appreciate any neurologic symptoms.  Patient only complained of headache.  Workup obtained including CT head without evidence of intracranial process.  Also obtained other workup for near syncopal event including  laboratory testing, troponin, EKG, BNP, chest x-ray to evaluate for causes such as cardiac arrhythmia, toxic or metabolic abnormality, occult infectious process.  On review of chart patient bradycardia seems at baseline.  He is in a sinus rhythm.    Wife arrived in the emergency department, discussed with wife around 1 hour after patient arrival who reported patient had some speech different from baseline, given reported abnormal speech, decision made to activate code stroke as patient still within the window to obtain tPA, but no other focal neurologic deficits appreciated.  Patient's wife also provided collateral history that the patient had taken a full dose of muscle relaxer and normally takes a half dose, just prior to his abnormal episode.  Suspect that this is likely the cause of the patient's abnormal speech and low blood pressure.  Remainder of workup so far reassuring.  On neurologic exam with neurology, NIHSS scale 0 and tPA not given due to very mild symptoms, but they recommend MRI given speech abnormalities.  Will proceed with MRI.  Patient signed out to oncoming physician Dr. Melina Copa pending remainder of labs including ethanol, BNP, repeat troponin and MRI.      Additional history obtained: -Additional history obtained from ems and spouse -External records from outside source obtained and reviewed including: Chart review including previous notes, labs, imaging, consultation notes including pror medical visits with similar bradycardia.    Lab Tests: -I ordered, reviewed, and interpreted labs.   The pertinent results include:   Labs Reviewed  COMPREHENSIVE METABOLIC PANEL - Abnormal; Notable for the following components:      Result Value   Sodium 131 (*)    Calcium 8.4 (*)    Total Protein 5.9 (*)    Albumin 3.1 (*)    All other components within normal limits  CBC WITH DIFFERENTIAL/PLATELET - Abnormal; Notable for the following components:   RBC 3.70 (*)    Hemoglobin 11.5 (*)     HCT 34.1 (*)    All other components within normal limits  PROTIME-INR - Abnormal; Notable for the following components:   Prothrombin Time 15.3 (*)    All other components within normal limits  I-STAT CHEM 8, ED - Abnormal; Notable for the following components:   Sodium 134 (*)    Calcium, Ion 1.11 (*)    Hemoglobin 10.9 (*)    HCT 32.0 (*)    All other components within normal limits  BRAIN NATRIURETIC PEPTIDE  URINALYSIS, ROUTINE W REFLEX MICROSCOPIC  ETHANOL  RAPID URINE DRUG SCREEN, HOSP PERFORMED  CBG MONITORING, ED  TROPONIN I (HIGH SENSITIVITY)  TROPONIN I (HIGH SENSITIVITY)    Notable for hyponatremia  EKG   EKG Interpretation  Date/Time:  Wednesday April 22 2022 14:19:37 EST Ventricular Rate:  44 PR  Interval:  205 QRS Duration: 108 QT Interval:  527 QTC Calculation: 451 R Axis:   55 Text Interpretation: Sinus bradycardia Confirmed by Garnette Gunner [phone]) on 04/22/2022 2:40:09 PM         Imaging Studies ordered: I ordered imaging studies including CXR, CT head On my interpretation imaging demonstrates no acute process I independently visualized and interpreted imaging. I agree with the radiologist interpretation   Medicines ordered and prescription drug management: No orders of the defined types were placed in this encounter.   -I have reviewed the patients home medicines and have made adjustments as needed   Consultations Obtained: I requested consultation with the neurologist,  and discussed lab and imaging findings as well as pertinent plan - they recommend: MRI  Reevaluation: After the interventions noted above, I reevaluated the patient and found that they have improved  Co morbidities that complicate the patient evaluation  Past Medical History:  Diagnosis Date   Arthralgia    Atrial fibrillation ([place])    1992   Benign prostatic hyperplasia    Bradycardia    "my doctor says I have bradycardia, my heart rate is always in the  40s"   DDD (degenerative disc disease)    Depression    Diabetes mellitus    type II  - PT STATES NOT ON ANY DIABETIC MEDICINES   Dysrhythmia    aFIB   Dyssomnia    ED (erectile dysfunction)    Hyperlipidemia    Hypertension    Hypogonadism male    Joint pain    LBP (low back pain)    Multiple actinic keratoses    Muscle weakness    Upper limb   MVP (mitral valve prolapse)    Neck pain    OSA on CPAP    cpap   Osteoarthritis of hip    Osteoarthritis, hand    SEVERE LOWER BACK PAIN, AND RESTLESS LEG SYNDROME   Otalgia of right ear    Pain in left knee    Pain in thoracic spine    Palpitations    PTSD (post-traumatic stress disorder)    Shoulder pain    Subjective visual disturbance    Swallowing problem    HX OF PILLS GETTING "STUCK" IN THROAT AT TIMES   Swelling    Type 2 diabetes mellitus ([place])    pt states he has never had it   Vitamin D deficiency       Dispostion: Disposition decision including need for hospitalization was considered, and patient disposition pending at time of sign out    Final Clinical Impression(s) / ED Diagnoses Final diagnoses:  Near syncope  Speech disturbance, unspecified type     This chart was dictated using voice recognition software.  Despite best efforts to proofread,  errors can occur which can change the documentation meaning.    Cristie Hem, MD 04/22/22 [phone]

## 2022-04-22 NOTE — ED Notes (Signed)
Patient's wife came out of room to let this RN know that patient seemed to be having trouble speaking. This RN went into room and patient was able to answer all questions appropriately. Patient continues to handle secretions and swallows without difficulty. Provider aware of same.

## 2022-04-22 NOTE — ED Notes (Signed)
Patient transported to MRI 

## 2022-04-22 NOTE — Code Documentation (Signed)
Vernon Bishop is an 82 yr old male who presented to [facility] for evaluation of hypotension and bradycardia.He is on Eliquis for AF. His wife mentioned to the RN that the pt felt he was having a hard time with finding his words. He also has a h/a. Code stroke activated by ED staff for wife's report of pt's report of word finding difficulty.    Stroke team to pt's bedside. He is on ED monitor and Zoll. HR in 40s, BP 120/70. CBG has been done per chart (75). Pt reports labs have been drawn. Pt examined by stroke team. NIHSS 0 at this time. Pt reports he has already had a CT scan of head. Per Dr. Cheral Marker, no hemorrhage seen on scan. Pt is not eligible for TNK due to Eliquis. Pt not candidate for NIR as no deficits noted, no LVO symptoms at this time. He will need q 2 hr VS and NIHSS.  Handoff with Amy, RN.

## 2022-04-22 NOTE — ED Notes (Signed)
This RN provided patient with something to eat and drink at this time. No complaints of pain at this time. NIBP, cardiac monitor and pulse ox remain in place. Bed in low and locked position and call bell within reach. Patient's wife remains at bedside with patient as well.

## 2022-04-22 NOTE — ED Triage Notes (Signed)
Patient arrives from home due to bradycardia. Patient was found pale and lethargic by family. HR in the 30's was given fluids and atropine and HR went into the 50's. Patient received another dose of atropine and EMS started an epi drip due to patient's BP in the 70's and HR in the 40's. Patient is a/o x4, only complains of headache.

## 2022-04-22 NOTE — Consult Note (Signed)
Neurology Consultation  Reason for Consult: Speech difficulties  Referring Physician: Dr. Truett Mainland  CC: Trouble finding words  History is obtained from: Patient, spouse and chart  HPI: Vernon Bishop is an 82 y.o. male with history of atrial fibrillation on Eliquis, hypertension, hyperlipidemia, prior left pontine stroke and cervical and lumbar spinal surgery who originally presented today with hypotension and bradycardia after taking tizanidine this morning, which he takes on a PRN basis for back pain.  He was treated for the hypotension with epinephrine and IV fluids and is now normotensive with heart rate in the 40s.  At approximately 1230, patient stated that he began having difficulty finding words, and his wife noticed a difference in his speech pattern. A Code Stroke was then called.   LKW: 1230 TNK given?: no, on Eliquis IR Thrombectomy? No, exam not consistent with LVO Modified Rankin Scale: 1-No significant post stroke disability and can perform usual duties with stroke symptoms  ROS: A complete ROS was performed and is negative except as noted in the HPI.   Past Medical History:  Diagnosis Date   Arthralgia    Atrial fibrillation ([place])    1992   Benign prostatic hyperplasia    Bradycardia    "my doctor says I have bradycardia, my heart rate is always in the 40s"   DDD (degenerative disc disease)    Depression    Diabetes mellitus    type II  - PT STATES NOT ON ANY DIABETIC MEDICINES   Dysrhythmia    aFIB   Dyssomnia    ED (erectile dysfunction)    Hyperlipidemia    Hypertension    Hypogonadism male    Joint pain    LBP (low back pain)    Multiple actinic keratoses    Muscle weakness    Upper limb   MVP (mitral valve prolapse)    Neck pain    OSA on CPAP    cpap   Osteoarthritis of hip    Osteoarthritis, hand    SEVERE LOWER BACK PAIN, AND RESTLESS LEG SYNDROME   Otalgia of right ear    Pain in left knee    Pain in thoracic spine    Palpitations     PTSD (post-traumatic stress disorder)    Shoulder pain    Subjective visual disturbance    Swallowing problem    HX OF PILLS GETTING "STUCK" IN THROAT AT TIMES   Swelling    Type 2 diabetes mellitus ([place])    pt states he has never had it   Vitamin D deficiency      Family History  Problem Relation Age of Onset   Allergies Mother    Stroke Mother        Clotting disorders   Heart disease Mother    Heart disease Father    Heart attack Father 75   Heart disease Brother    Rheum arthritis Paternal Grandmother    Other Paternal Grandmother        Rheumatism   Coronary artery disease Other    Diabetes Other    Hypertension Other    Esophageal cancer Maternal Aunt    Colon cancer Neg Hx    Rectal cancer Neg Hx    Stomach cancer Neg Hx      Social History:   reports that he quit smoking about 51 years ago. His smoking use included cigarettes. He has a 6.00 pack-year smoking history. He has never used smokeless tobacco. He reports current alcohol use  of about 2.0 standard drinks of alcohol per week. He reports that he does not use drugs.  Medications No current facility-administered medications for this encounter.  Current Outpatient Medications:    acetaminophen (TYLENOL) 325 MG tablet, Take 325-650 mg by mouth every 6 (six) hours as needed for mild pain or headache., Disp: , Rfl:    amLODipine (NORVASC) 5 MG tablet, TAKE 1 TABLET BY MOUTH DAILY., Disp: 90 tablet, Rfl: 3   apixaban (ELIQUIS) 5 MG TABS tablet, Take 1 tablet (5 mg total) by mouth 2 (two) times daily., Disp: 180 tablet, Rfl: 3   Cholecalciferol (VITAMIN D3 PO), Take 1 tablet by mouth daily., Disp: , Rfl:    clotrimazole-betamethasone (LOTRISONE) cream, Apply 1 application. topically 2 (two) times daily. (Patient taking differently: Apply 1 application  topically daily as needed (to affected areas of the face).), Disp: 45 g, Rfl: 1   Cyanocobalamin (VITAMIN B-12 CR) 1000 MCG TBCR, Take 1,000 mcg by mouth daily.  ,  Disp: , Rfl:    ezetimibe (ZETIA) 10 MG tablet, Take 1 tablet (10 mg total) by mouth daily., Disp: 30 tablet, Rfl: 3   FLUoxetine (PROZAC) 40 MG capsule, Take 1 capsule (40 mg total) by mouth daily., Disp: 90 capsule, Rfl: 2   furosemide (LASIX) 20 MG tablet, TAKE 1 TABLET (20 MG TOTAL) BY MOUTH DAILY. (Patient taking differently: Take 20 mg by mouth daily as needed for edema.), Disp: 30 tablet, Rfl: 3   gabapentin (NEURONTIN) 300 MG capsule, Take 1 capsule (300 mg total) by mouth 3 (three) times daily., Disp: 270 capsule, Rfl: 3   methylPREDNISolone (MEDROL DOSEPAK) 4 MG TBPK tablet, As directed, Disp: 21 tablet, Rfl: 0   olmesartan-hydrochlorothiazide (BENICAR HCT) 20-12.5 MG tablet, Take 1 tablet by mouth daily., Disp: 90 tablet, Rfl: 3   oxyCODONE-acetaminophen (PERCOCET) 10-325 MG tablet, Take 1 tablet by mouth every 6 (six) hours as needed for pain., Disp: 15 tablet, Rfl: 0   pravastatin (PRAVACHOL) 40 MG tablet, Take 1 tablet (40 mg total) by mouth every evening., Disp: 90 tablet, Rfl: 3   PRESCRIPTION MEDICATION, CPAP- At bedtime, Disp: , Rfl:    propranolol (INDERAL) 80 MG tablet, TAKE 1 TABLET BY MOUTH 3 TIMES DAILY., Disp: 270 tablet, Rfl: 3   sodium chloride (OCEAN) 0.65 % SOLN nasal spray, Place 1 spray into both nostrils as needed for congestion., Disp: , Rfl:    tetrahydrozoline 0.05 % ophthalmic solution, Place 1 drop into both eyes 2 (two) times daily as needed (for redness)., Disp: , Rfl:    tiZANidine (ZANAFLEX) 4 MG tablet, TAKE 1 TABLET BY MOUTH EVERY 8 HOURS AS NEEDED FOR MUSCLE SPASMS. (Patient taking differently: Take 4 mg by mouth every 8 (eight) hours as needed for muscle spasms.), Disp: 60 tablet, Rfl: 1   traZODone (DESYREL) 50 MG tablet, Take 1-2 tablets (50-100 mg total) by mouth at bedtime., Disp: 60 tablet, Rfl: 5   triamcinolone ointment (KENALOG) 0.1 %, Apply 1 Application topically 3 (three) times daily., Disp: 80 g, Rfl: 2   zinc gluconate 50 MG tablet, Take 50 mg  by mouth daily., Disp: , Rfl:    Exam: Current vital signs: BP 120/70   Pulse (!) 42   Temp 98.1 F (36.7 C)   Resp 11   Ht 6' (1.829 m)   Wt 97.5 kg   SpO2 100%   BMI 29.16 kg/m  Vital signs in last 24 hours: Temp:  [98.1 F (36.7 C)] 98.1 F (36.7 C) (01/24  1420) Pulse Rate:  [41-93] 42 (01/24 1545) Resp:  [10-18] 11 (01/24 1545) BP: (106-125)/(45-78) 120/70 (01/24 1545) SpO2:  [92 %-100 %] 100 % (01/24 1545) Weight:  [97.5 kg] 97.5 kg (01/24 1433)  GENERAL: Awake, alert, in no acute distress Psych: Affect appropriate for situation, patient is calm and cooperative with examination Head: Normocephalic and atraumatic, without obvious abnormality EENT: Normal conjunctivae, moist mucous membranes, no OP obstruction LUNGS: Normal respiratory effort. Non-labored breathing on room air CV: Sinus bradycardia in the 40s Extremities: warm, well perfused, without obvious deformity  NEURO:  Mental Status: Awake, alert, and oriented to person, place, time, and situation. She is able to provide a clear and coherent history of present illness. Speech/Language: speech is clear and fluent with occasional stumbling over words.   Naming, repetition, fluency, and comprehension intact without aphasia  No neglect is noted Cranial Nerves:  II: PERRL visual fields full.  III, IV, VI: EOMI. Lid elevation symmetric and full.  V: Sensation is intact to light touch and symmetrical to face.  VII: Face is symmetric resting and smiling. Able to puff cheeks and raise eyebrows.  VIII: Hearing intact to voice IX, X: Phonation normal.  XII: Tongue protrudes midline without fasciculations.   Motor: 5/5 strength in all muscle groups.  Tone is normal. Bulk is normal.  Sensation: Intact to light touch bilaterally in all four extremities. No extinction to DSS present.  Coordination: FTN intact bilaterally. No pronator drift.  Gait: Deferred  NIHSS: 1a Level of Conscious.: 0 1b LOC Questions: 0 1c  LOC Commands: 0 2 Best Gaze: 0 3 Visual: 0 4 Facial Palsy: 0 5a Motor Arm - left: 0 5b Motor Arm - Right: 0 6a Motor Leg - Left: 0 6b Motor Leg - Right: 0 7 Limb Ataxia: 0 8 Sensory: 0 9 Best Language: 0 10 Dysarthria: 0 11 Extinct. and Inatten.: 0 TOTAL: 0   Labs I have reviewed labs in epic and the results pertinent to this consultation are:   CBC    Component Value Date/Time   WBC 6.0 04/22/2022 1452   RBC 3.70 (L) 04/22/2022 1452   HGB 10.9 (L) 04/22/2022 1457   HCT 32.0 (L) 04/22/2022 1457   PLT 171 04/22/2022 1452   MCV 92.2 04/22/2022 1452   MCH 31.1 04/22/2022 1452   MCHC 33.7 04/22/2022 1452   RDW 13.4 04/22/2022 1452   LYMPHSABS 1.1 04/22/2022 1452   MONOABS 0.6 04/22/2022 1452   EOSABS 0.1 04/22/2022 1452   BASOSABS 0.0 04/22/2022 1452    CMP     Component Value Date/Time   NA 134 (L) 04/22/2022 1457   NA 141 07/01/2021 1605   K 4.5 04/22/2022 1457   CL 99 04/22/2022 1457   CO2 28 11/28/2021 1515   GLUCOSE 76 04/22/2022 1457   BUN 18 04/22/2022 1457   BUN 19 07/01/2021 1605   CREATININE 0.90 04/22/2022 1457   CREATININE 0.75 12/20/2019 1516   CALCIUM 9.3 11/28/2021 1515   PROT 7.0 11/28/2021 1515   ALBUMIN 4.0 11/28/2021 1515   AST 19 11/28/2021 1515   ALT 19 11/28/2021 1515   ALKPHOS 111 11/28/2021 1515   BILITOT 0.6 11/28/2021 1515   GFRNONAA >60 06/08/2021 1328   GFRNONAA 87 12/20/2019 1516   GFRAA 101 12/20/2019 1516    Lipid Panel     Component Value Date/Time   CHOL 157 06/09/2021 0031   TRIG 99 06/09/2021 0031   HDL 33 (L) 06/09/2021 0031   CHOLHDL 4.8 06/09/2021  0031   VLDL 20 06/09/2021 0031   LDLCALC 104 (H) 06/09/2021 0031   LDLCALC 120 (H) 12/20/2019 1516     Imaging I have reviewed the images obtained:  CT-scan of the brain: No acute abnormality, chronic small vessel ischemic disease  MRI examination of the brain: Pending  Assessment: 82 year old patient with history of hypertension, hyperlipidemia, cervical  and lumbar spinal surgery, prior pontine stroke and atrial fibrillation on Eliquis who presented originally with hypotension and bradycardia, then developed some word finding difficulties.   - On exam, while he is able to name and repeat, he feels that his speech is not at baseline and his wife concurs with this. It is worthwhile to obtain brain MRI to rule out a small stroke.   - Of note, the patient was quite hypotensive earlier in the day with blood pressure in the 60s/30s after taking tizanidine and therefore he may have had an episode of hypoperfusion during this time, resulting in the speech symptoms.  Impression: Episode of hypoperfusion causing speech changes versus TIA versus small stroke  Recommendations: -Obtain brain MRI -Treatment of hypotension per primary team -Discontinue tizanidine -Full stroke workup to follow if MRI positive  Pt seen by NP/Neuro and later by MD.  Katy Apo , MSN, AGACNP-BC [facility] See Amion for schedule and pager information 04/22/2022 43:41 PM  82 year old male with a history of atrial fibrillation, on DOAC, who experienced sudden onset of word finding difficulty at 1230 today after initially presenting for assessment of bradycardia with hypotension. Exam is nonfocal except for equivocal word-finding deficit versus incidental stuttering. DDx includes cerebral hypoperfusion resulting in speech changes, versus a small cardioembolic stroke or TIA.  Recommendations as above.  Electronically signed: Dr. Kerney Elbe

## 2022-04-22 NOTE — Telephone Encounter (Signed)
Agree with ER visit. Thanks.

## 2022-04-22 NOTE — ED Notes (Signed)
Stroke RN and neurologist at bedside at this time to evaluate patient.

## 2022-04-22 NOTE — Telephone Encounter (Signed)
Patients wife called and said patient was dizzy and that she said his BP was 61/34, It might have been a misread but I got her over to the triage nurse right away to be sure and to evaluate

## 2022-04-22 NOTE — ED Notes (Signed)
This RN provided patient with some apple juice to drink at this time. ED provider at bedside to update patient and patient's wife on test results and plan of care.

## 2022-04-22 NOTE — Discharge Instructions (Addendum)
You were seen in the emergency department for evaluation of low heart rate feeling lightheaded.  Your blood pressure was initially low but this improved over time.  You had lab work EKG CAT scan and MRI of your brain that did not show any critical findings.  Please continue your regular medications and follow-up with your primary care doctor.  Return to the emergency department if any worsening or concerning symptoms.

## 2022-04-22 NOTE — ED Notes (Signed)
All discharge instructions reviewed with patient including follow up care and medications. Patient and patient's wife at bedside both verbalized understanding of same and had no other questions. Patient stable at time of discharge and wheel chair provided to patient prior to leaving department.

## 2022-04-22 NOTE — ED Provider Notes (Signed)
Signout from Dr. Truett Mainland.  82 year old male presenting by EMS for bradycardia hypotension altered mental status.  He has a history of bradycardia.  Took a full dose of muscle relaxant which is unusual for him.  Bradycardia appears stable and blood pressures improved.  He is pending MRI brain for some possible slurred speech.  Anticipate if continues to improve would be appropriate for discharge, if no other significant concerns identified Physical Exam  BP (!) 115/52   Pulse (!) 41   Temp 98.1 F (36.7 C)   Resp 19   Ht 6' (1.829 m)   Wt 97.5 kg   SpO2 100%   BMI 29.16 kg/m   Physical Exam  Procedures  Procedures  ED Course / MDM    Medical Decision Making Amount and/or Complexity of Data Reviewed Labs: ordered. Radiology: ordered.   MRI does not show an acute stroke.  He does have a couple of prior old strokes.  I reviewed this with the patient and his wife and they are aware of this.  She said he is back to baseline.  He is comfortable plan for discharge.  He understands to follow-up with his regular doctor and return if any worsening or concerning symptoms       Hayden Rasmussen, MD 04/23/22 0830

## 2022-04-23 ENCOUNTER — Ambulatory Visit (INDEPENDENT_AMBULATORY_CARE_PROVIDER_SITE_OTHER): Payer: PPO | Admitting: Internal Medicine

## 2022-04-23 ENCOUNTER — Encounter: Payer: Self-pay | Admitting: Internal Medicine

## 2022-04-23 VITALS — BP 110/50 | HR 50 | Temp 98.0°F | Ht 72.0 in | Wt 220.0 lb

## 2022-04-23 DIAGNOSIS — I959 Hypotension, unspecified: Secondary | ICD-10-CM

## 2022-04-23 DIAGNOSIS — R479 Unspecified speech disturbances: Secondary | ICD-10-CM

## 2022-04-23 DIAGNOSIS — I10 Essential (primary) hypertension: Secondary | ICD-10-CM | POA: Diagnosis not present

## 2022-04-23 MED ORDER — METHYLPREDNISOLONE 4 MG PO TBPK: ORAL_TABLET | ORAL | 0 refills | Status: DC

## 2022-04-23 MED ORDER — PROPRANOLOL HCL ER 80 MG PO CP24: 80.0000 mg | ORAL_CAPSULE | Freq: Every day | ORAL | 3 refills | Status: DC

## 2022-04-23 MED ORDER — TRIAMCINOLONE ACETONIDE 0.1 % EX OINT: 1.0000 | TOPICAL_OINTMENT | Freq: Four times a day (QID) | CUTANEOUS | 2 refills | Status: AC

## 2022-04-23 NOTE — Progress Notes (Signed)
Subjective:  Patient ID: Vernon Bishop, male    DOB: 1940-08-09  Age: 82 y.o. MRN: [id number]  CC: No chief complaint on file.   HPI [name] presents for low BP and slurred speech after taking Zanaflex on 04/22/22. He went to ER by ambulance. CVA was ruled out... C/o rash on arms ?started after COVID Rx  Per hx:   "82 year old male presenting to the emergency department after episode of low blood pressure.   Patient well-appearing on arrival to the emergency department with improvement in blood pressure.  Initially did not appreciate any neurologic symptoms.  Patient only complained of headache.  Workup obtained including CT head without evidence of intracranial process.  Also obtained other workup for near syncopal event including laboratory testing, troponin, EKG, BNP, chest x-ray to evaluate for causes such as cardiac arrhythmia, toxic or metabolic abnormality, occult infectious process.  On review of chart patient bradycardia seems at baseline.  He is in a sinus rhythm.      Wife arrived in the emergency department, discussed with wife around 1 hour after patient arrival who reported patient had some speech different from baseline, given reported abnormal speech, decision made to activate code stroke as patient still within the window to obtain tPA, but no other focal neurologic deficits appreciated.  Patient's wife also provided collateral history that the patient had taken a full dose of muscle relaxer and normally takes a half dose, just prior to his abnormal episode.  Suspect that this is likely the cause of the patient's abnormal speech and low blood pressure.  Remainder of workup so far reassuring.  On neurologic exam with neurology, NIHSS scale 0 and tPA not given due to very mild symptoms, but they recommend MRI given speech abnormalities.  Will proceed with MRI.  Patient signed out to oncoming physician Dr. Melina Copa pending remainder of labs including ethanol, BNP, repeat  troponin and MRI.       Additional history obtained: -Additional history obtained from ems and spouse -External records from outside source obtained and reviewed including: Chart review including previous notes, labs, imaging, consultation notes including pror medical visits with similar bradycardia.      Lab Tests: -I ordered, reviewed, and interpreted labs.   The pertinent results include:        Labs Reviewed  COMPREHENSIVE METABOLIC PANEL - Abnormal; Notable for the following components:      Result Value     Sodium 131 (*)      Calcium 8.4 (*)      Total Protein 5.9 (*)      Albumin 3.1 (*)      All other components within normal limits  CBC WITH DIFFERENTIAL/PLATELET - Abnormal; Notable for the following components:    RBC 3.70 (*)      Hemoglobin 11.5 (*)      HCT 34.1 (*)      All other components within normal limits  PROTIME-INR - Abnormal; Notable for the following components:    Prothrombin Time 15.3 (*)      All other components within normal limits  I-STAT CHEM 8, ED - Abnormal; Notable for the following components:    Sodium 134 (*)      Calcium, Ion 1.11 (*)      Hemoglobin 10.9 (*)      HCT 32.0 (*)      All other components within normal limits  BRAIN NATRIURETIC PEPTIDE  URINALYSIS, ROUTINE W REFLEX MICROSCOPIC  ETHANOL  RAPID URINE  DRUG SCREEN, HOSP PERFORMED  CBG MONITORING, ED  TROPONIN I (HIGH SENSITIVITY)  TROPONIN I (HIGH SENSITIVITY)    Notable for hyponatremia   EKG    EKG Interpretation   Date/Time:                      Wednesday April 22 2022 14:19:37 EST Ventricular Rate:   44 PR Interval:                      205 QRS Duration:        108 QT Interval:                      527 QTC Calculation:    451 R Axis:                         55 Text Interpretation: Sinus bradycardia Confirmed by Garnette Gunner [phone]) on 04/22/2022 2:40:09 PM               Imaging Studies ordered: I ordered imaging studies including CXR, CT  head On my interpretation imaging demonstrates no acute process I independently visualized and interpreted imaging. I agree with the radiologist interpretation     Medicines ordered and prescription drug management: No orders of the defined types were placed in this encounter.   -I have reviewed the patients home medicines and have made adjustments as needed     Consultations Obtained: I requested consultation with the neurologist,  and discussed lab and imaging findings as well as pertinent plan - they recommend: MRI   Reevaluation: After the interventions noted above, I reevaluated the patient and found that they have improved      Dispostion: Disposition decision including need for hospitalization was considered, and patient disposition pending at time of sign out       Final Clinical Impression(s) / ED Diagnoses Final diagnoses:  Near syncope  Speech disturbance, unspecified type   "  Outpatient Medications Prior to Visit  Medication Sig Dispense Refill   acetaminophen (TYLENOL) 325 MG tablet Take 325-650 mg by mouth every 6 (six) hours as needed for mild pain or headache.     amLODipine (NORVASC) 5 MG tablet TAKE 1 TABLET BY MOUTH DAILY. 90 tablet 3   apixaban (ELIQUIS) 5 MG TABS tablet Take 1 tablet (5 mg total) by mouth 2 (two) times daily. 180 tablet 3   Cholecalciferol (VITAMIN D3 PO) Take 1 tablet by mouth daily.     clotrimazole-betamethasone (LOTRISONE) cream Apply 1 application. topically 2 (two) times daily. (Patient taking differently: Apply 1 application  topically daily as needed (to affected areas of the face).) 45 g 1   Cyanocobalamin (VITAMIN B-12 CR) 1000 MCG TBCR Take 1,000 mcg by mouth daily.       ezetimibe (ZETIA) 10 MG tablet Take 1 tablet (10 mg total) by mouth daily. 30 tablet 3   FLUoxetine (PROZAC) 40 MG capsule Take 1 capsule (40 mg total) by mouth daily. 90 capsule 2   furosemide (LASIX) 20 MG tablet TAKE 1 TABLET (20 MG TOTAL) BY MOUTH DAILY.  (Patient taking differently: Take 20 mg by mouth daily as needed for edema.) 30 tablet 3   gabapentin (NEURONTIN) 300 MG capsule Take 1 capsule (300 mg total) by mouth 3 (three) times daily. 270 capsule 3   olmesartan-hydrochlorothiazide (BENICAR HCT) 20-12.5 MG tablet Take 1 tablet by mouth daily. 90 tablet 3  oxyCODONE-acetaminophen (PERCOCET) 10-325 MG tablet Take 1 tablet by mouth every 6 (six) hours as needed for pain. 15 tablet 0   pravastatin (PRAVACHOL) 40 MG tablet Take 1 tablet (40 mg total) by mouth every evening. 90 tablet 3   PRESCRIPTION MEDICATION CPAP- At bedtime     sodium chloride (OCEAN) 0.65 % SOLN nasal spray Place 1 spray into both nostrils as needed for congestion.     tetrahydrozoline 0.05 % ophthalmic solution Place 1 drop into both eyes 2 (two) times daily as needed (for redness).     traZODone (DESYREL) 50 MG tablet Take 1-2 tablets (50-100 mg total) by mouth at bedtime. 60 tablet 5   zinc gluconate 50 MG tablet Take 50 mg by mouth daily.     methylPREDNISolone (MEDROL DOSEPAK) 4 MG TBPK tablet As directed 21 tablet 0   propranolol (INDERAL) 80 MG tablet TAKE 1 TABLET BY MOUTH 3 TIMES DAILY. 270 tablet 3   triamcinolone ointment (KENALOG) 0.1 % Apply 1 Application topically 3 (three) times daily. 80 g 2   tiZANidine (ZANAFLEX) 4 MG tablet TAKE 1 TABLET BY MOUTH EVERY 8 HOURS AS NEEDED FOR MUSCLE SPASMS. (Patient not taking: Reported on 04/23/2022) 60 tablet 1   No facility-administered medications prior to visit.    ROS: Review of Systems  Constitutional:  Negative for appetite change, fatigue and unexpected weight change.  HENT:  Negative for congestion, nosebleeds, sneezing, sore throat and trouble swallowing.   Eyes:  Negative for itching and visual disturbance.  Respiratory:  Negative for cough.   Cardiovascular:  Negative for chest pain, palpitations and leg swelling.  Gastrointestinal:  Negative for abdominal distention, blood in stool, diarrhea and nausea.   Genitourinary:  Negative for frequency and hematuria.  Musculoskeletal:  Negative for back pain, gait problem, joint swelling and neck pain.  Skin:  Positive for rash.  Neurological:  Negative for dizziness, tremors, speech difficulty and weakness.  Psychiatric/Behavioral:  Negative for agitation, dysphoric mood and sleep disturbance. The patient is not nervous/anxious.     Objective:  BP (!) 110/50 (BP Location: Left Arm, Patient Position: Sitting, Cuff Size: Normal)   Pulse (!) 50   Temp 98 F (36.7 C) (Oral)   Ht 6' (1.829 m)   Wt 220 lb (99.8 kg)   SpO2 96%   BMI 29.84 kg/m   BP Readings from Last 3 Encounters:  04/23/22 (!) 110/50  04/22/22 139/67  03/31/22 122/70    Wt Readings from Last 3 Encounters:  04/23/22 220 lb (99.8 kg)  04/22/22 215 lb (97.5 kg)  03/31/22 215 lb (97.5 kg)    Physical Exam Constitutional:      General: He is not in acute distress.    Appearance: He is well-developed.     Comments: NAD  Eyes:     Conjunctiva/sclera: Conjunctivae normal.     Pupils: Pupils are equal, round, and reactive to light.  Neck:     Thyroid: No thyromegaly.     Vascular: No JVD.  Cardiovascular:     Rate and Rhythm: Normal rate and regular rhythm.     Heart sounds: Normal heart sounds. No murmur heard.    No friction rub. No gallop.  Pulmonary:     Effort: Pulmonary effort is normal. No respiratory distress.     Breath sounds: Normal breath sounds. No wheezing or rales.  Chest:     Chest wall: No tenderness.  Abdominal:     General: Bowel sounds are normal. There is no distension.  Palpations: Abdomen is soft. There is no mass.     Tenderness: There is no abdominal tenderness. There is no guarding or rebound.  Musculoskeletal:        General: No tenderness. Normal range of motion.     Cervical back: Normal range of motion.     Right lower leg: No edema.     Left lower leg: No edema.  Lymphadenopathy:     Cervical: No cervical adenopathy.  Skin:     General: Skin is warm and dry.     Findings: Erythema and rash present.  Neurological:     Mental Status: He is alert and oriented to person, place, and time.     Cranial Nerves: No cranial nerve deficit.     Motor: No abnormal muscle tone.     Coordination: Coordination normal.     Gait: Gait normal.     Deep Tendon Reflexes: Reflexes are normal and symmetric.  Psychiatric:        Behavior: Behavior normal.        Thought Content: Thought content normal.        Judgment: Judgment normal.    Extensive eczema-like rash on B arms   Lab Results  Component Value Date   WBC 6.0 04/22/2022   HGB 10.9 (L) 04/22/2022   HCT 32.0 (L) 04/22/2022   PLT 171 04/22/2022   GLUCOSE 76 04/22/2022   CHOL 157 06/09/2021   TRIG 99 06/09/2021   HDL 33 (L) 06/09/2021   LDLCALC 104 (H) 06/09/2021   ALT 18 04/22/2022   AST 21 04/22/2022   NA 134 (L) 04/22/2022   K 4.5 04/22/2022   CL 99 04/22/2022   CREATININE 0.90 04/22/2022   BUN 18 04/22/2022   CO2 24 04/22/2022   TSH 2.340 02/02/2022   PSA 0.73 12/20/2019   INR 1.2 04/22/2022   HGBA1C 5.0 06/09/2021   MICROALBUR 3.9 12/20/2019    MR BRAIN WO CONTRAST  Result Date: 04/22/2022 CLINICAL DATA:  Neuro deficit, acute, stroke suspected. EXAM: MRI HEAD WITHOUT CONTRAST TECHNIQUE: Multiplanar, multiecho pulse sequences of the brain and surrounding structures were obtained without intravenous contrast. COMPARISON:  Head CT 04/22/2022 and MRI 06/08/2021 FINDINGS: Brain: There is no evidence of an acute infarct, intracranial hemorrhage, mass, midline shift, or extra-axial fluid collection. Mild cerebral atrophy is within normal limits for age. Small T2 hyperintensities in the cerebral white matter bilaterally are unchanged from the prior MRI and are nonspecific but compatible with minimal chronic small vessel ischemic disease, also considered to be within normal limits for age. Small chronic infarcts are again noted in the left pons. Vascular: Major  intracranial vascular flow voids are preserved. Skull and upper cervical spine: Unremarkable bone marrow signal. Sinuses/Orbits: Bilateral cataract extraction. Moderate mucosal thickening in the right maxillary sinus and mild mucosal thickening in the ethmoid air cells bilaterally. Small right mastoid effusion. Other: None. IMPRESSION: 1. No acute intracranial abnormality. 2. Chronic pontine infarcts. Electronically Signed   By: Logan Bores M.D.   On: 04/22/2022 18:35   CT Head Wo Contrast  Result Date: 04/22/2022 CLINICAL DATA:  Headache.) EXAM: CT HEAD WITHOUT CONTRAST TECHNIQUE: Contiguous axial images were obtained from the base of the skull through the vertex without intravenous contrast. RADIATION DOSE REDUCTION: This exam was performed according to the departmental dose-optimization program which includes automated exposure control, adjustment of the mA and/or kV according to patient size and/or use of iterative reconstruction technique. COMPARISON:  06/08/2021 FINDINGS: Brain: There is periventricular white  matter decreased attenuation consistent with small vessel ischemic changes. Gray-white differentiation is preserved. No acute intracranial hemorrhage, mass effect or shift. No hydrocephalus. Vascular: No hyperdense vessel or unexpected calcification. Skull: Normal. Negative for fracture or focal lesion. Sinuses/Orbits: No acute finding. IMPRESSION: Periventricular white matter changes consistent with chronic small vessel ischemia. No acute intracranial process identified. Electronically Signed   By: Sammie Bench M.D.   On: 04/22/2022 15:15   DG Chest Port 1 View  Result Date: 04/22/2022 CLINICAL DATA:  Weakness EXAM: PORTABLE CHEST 1 VIEW COMPARISON:  Chest x-ray dated January 10, 2021 FINDINGS: Cardiac and mediastinal contours within normal limits for AP technique. Left costophrenic angle is excluded from the field of view. Bibasilar atelectasis. No large pleural effusion or pneumothorax.  IMPRESSION: No active disease. Electronically Signed   By: Yetta Glassman M.D.   On: 04/22/2022 15:07    Assessment & Plan:   Problem List Items Addressed This Visit       Cardiovascular and Mediastinum   HTN (hypertension)    D/c Propranalol 80 mg tid. Start Inderal LA 80 mg qpm.      Relevant Medications   propranolol ER (INDERAL LA) 80 MG 24 hr capsule   Hypotensive episode - Primary    Low BP and slurred speech after taking Zanaflex on 04/22/22. He went to ER by ambulance. CVA was ruled out... D/c Zanaflex D/c Propranalol 80 mg tid. Start Inderal LA 80 mg qpm.      Relevant Medications   propranolol ER (INDERAL LA) 80 MG 24 hr capsule     Other   Speech disturbance    New Low BP and slurred speech after taking Zanaflex on 04/22/22. He went to ER by ambulance. CVA was ruled out... D/c Zanaflex. It could be related to migraine aura (he had a HA).         Meds ordered this encounter  Medications   propranolol ER (INDERAL LA) 80 MG 24 hr capsule    Sig: Take 1 capsule (80 mg total) by mouth daily.    Dispense:  90 capsule    Refill:  3   triamcinolone ointment (KENALOG) 0.1 %    Sig: Apply 1 Application topically 4 (four) times daily.    Dispense:  160 g    Refill:  2   methylPREDNISolone (MEDROL DOSEPAK) 4 MG TBPK tablet    Sig: As directed    Dispense:  21 tablet    Refill:  0      Follow-up: Return in about 6 weeks (around 06/04/2022) for a follow-up visit.  Walker Kehr, MD

## 2022-04-23 NOTE — Assessment & Plan Note (Signed)
D/c Propranalol 80 mg tid. Start Inderal LA 80 mg qpm.

## 2022-04-23 NOTE — Assessment & Plan Note (Addendum)
Low BP and slurred speech after taking Zanaflex on 04/22/22. He went to ER by ambulance. CVA was ruled out... D/c Zanaflex D/c Propranalol 80 mg tid. Start Inderal LA 80 mg qpm.

## 2022-04-23 NOTE — Assessment & Plan Note (Signed)
New Low BP and slurred speech after taking Zanaflex on 04/22/22. He went to ER by ambulance. CVA was ruled out... D/c Zanaflex. It could be related to migraine aura (he had a HA).

## 2022-04-27 DIAGNOSIS — D6869 Other thrombophilia: Secondary | ICD-10-CM | POA: Diagnosis not present

## 2022-04-27 DIAGNOSIS — I4891 Unspecified atrial fibrillation: Secondary | ICD-10-CM | POA: Diagnosis not present

## 2022-04-27 DIAGNOSIS — M47814 Spondylosis without myelopathy or radiculopathy, thoracic region: Secondary | ICD-10-CM | POA: Diagnosis not present

## 2022-04-27 DIAGNOSIS — M47816 Spondylosis without myelopathy or radiculopathy, lumbar region: Secondary | ICD-10-CM | POA: Diagnosis not present

## 2022-05-01 ENCOUNTER — Encounter: Payer: Self-pay | Admitting: Cardiology

## 2022-05-07 ENCOUNTER — Other Ambulatory Visit: Payer: Self-pay | Admitting: Cardiology

## 2022-05-07 DIAGNOSIS — M47814 Spondylosis without myelopathy or radiculopathy, thoracic region: Secondary | ICD-10-CM | POA: Diagnosis not present

## 2022-05-07 DIAGNOSIS — E782 Mixed hyperlipidemia: Secondary | ICD-10-CM

## 2022-05-20 DIAGNOSIS — E782 Mixed hyperlipidemia: Secondary | ICD-10-CM | POA: Diagnosis not present

## 2022-05-21 LAB — LIPID PANEL
Chol/HDL Ratio: 3.4 ratio (ref 0.0–5.0)
Cholesterol, Total: 119 mg/dL (ref 100–199)
HDL: 35 mg/dL — ABNORMAL LOW (ref >39)
LDL Chol Calc (NIH): 65 mg/dL (ref 0–99)
Triglycerides: 102 mg/dL (ref 0–149)
VLDL Cholesterol Cal: 19 mg/dL (ref 5–40)

## 2022-05-22 ENCOUNTER — Ambulatory Visit: Payer: PPO | Admitting: Cardiology

## 2022-05-25 ENCOUNTER — Ambulatory Visit: Payer: PPO | Admitting: Cardiology

## 2022-05-27 ENCOUNTER — Ambulatory Visit (HOSPITAL_BASED_OUTPATIENT_CLINIC_OR_DEPARTMENT_OTHER): Payer: PPO | Admitting: Pulmonary Disease

## 2022-05-29 ENCOUNTER — Encounter (HOSPITAL_BASED_OUTPATIENT_CLINIC_OR_DEPARTMENT_OTHER): Payer: Self-pay | Admitting: Pulmonary Disease

## 2022-05-29 ENCOUNTER — Ambulatory Visit (INDEPENDENT_AMBULATORY_CARE_PROVIDER_SITE_OTHER): Payer: PPO | Admitting: Pulmonary Disease

## 2022-05-29 VITALS — BP 124/72 | HR 51 | Ht 72.0 in | Wt 226.0 lb

## 2022-05-29 DIAGNOSIS — G4733 Obstructive sleep apnea (adult) (pediatric): Secondary | ICD-10-CM

## 2022-05-29 DIAGNOSIS — I480 Paroxysmal atrial fibrillation: Secondary | ICD-10-CM | POA: Diagnosis not present

## 2022-05-29 NOTE — Patient Instructions (Signed)
CPAP is working well on 12 cm. Large leak on your nasal mask

## 2022-05-29 NOTE — Assessment & Plan Note (Signed)
Discussed close correlation between OSA and A-fib and benefits of CPAP

## 2022-05-29 NOTE — Progress Notes (Signed)
   Subjective:    Patient ID: Vernon Bishop, male    DOB: 09-Jul-1940, 82 y.o.   MRN: [id number]  HPI 82 year old for follow-up of OSA  Few centrals on download attributed to chronic narcotics -on CPAP 12    PMH -atrial fibrillation, chronic pain on narcotics    Chief Complaint  Patient presents with   Follow-up    Pt states he has been doing okay since last visit and denies any real complaints.   Annual visit. He had hip surgery in 2022 and still has not fully recovered, his mobility is slow. He reports leg swelling, left more than right, he is maintained on apixaban for atrial fibrillation.  He takes Lasix 20 mg daily. Bedtime can be as late as 2 AM, he occasionally takes trazodone to help him with sleep maintenance. CPAP is working well and denies for any problems with mask or pressure.  We got him a new machine in 2022.  He denies sleep pressure in the daytime or choking or gasping episodes in his sleep  Significant tests/ events reviewed  NPSG 2002:  AHI 80/hr.  CPAP 12cm optimal Auto 2013:  Optimal pressure 12cm.  His sleep is interrupted due to chronic pain and reports numerous nocturnal awakenings.   Review of Systems neg for any significant sore throat, dysphagia, itching, sneezing, nasal congestion or excess/ purulent secretions, fever, chills, sweats, unintended wt loss, pleuritic or exertional cp, hempoptysis, orthopnea pnd or change in chronic leg swelling. Also denies presyncope, palpitations, heartburn, abdominal pain, nausea, vomiting, diarrhea or change in bowel or urinary habits, dysuria,hematuria, rash, arthralgias, visual complaints, headache, numbness weakness or ataxia.     Objective:   Physical Exam  Gen. Pleasant, obese, in no distress ENT - no lesions, no post nasal drip Neck: No JVD, no thyromegaly, no carotid bruits Lungs: no use of accessory muscles, no dullness to percussion, decreased without rales or rhonchi  Cardiovascular: Rhythm regular,  heart sounds  normal, no murmurs or gallops, no peripheral edema Musculoskeletal: No deformities, no cyanosis or clubbing , no tremors       Assessment & Plan:

## 2022-05-29 NOTE — Assessment & Plan Note (Signed)
CPAP download was reviewed which shows excellent control of events on CPAP 12 cm.  He has a large leak.  He is very compliant and CPAP is only helped decrease his daytime somnolence and fatigue. Weight loss encouraged, compliance with goal of at least 4-6 hrs every night is the expectation. Advised against medications with sedative side effects Cautioned against driving when sleepy - understanding that sleepiness will vary on a day to day basis

## 2022-06-01 ENCOUNTER — Encounter: Payer: Self-pay | Admitting: Cardiology

## 2022-06-01 ENCOUNTER — Ambulatory Visit: Payer: PPO | Admitting: Cardiology

## 2022-06-01 VITALS — BP 128/58 | HR 76 | Ht 72.0 in | Wt 226.4 lb

## 2022-06-01 DIAGNOSIS — I10 Essential (primary) hypertension: Secondary | ICD-10-CM

## 2022-06-01 DIAGNOSIS — E782 Mixed hyperlipidemia: Secondary | ICD-10-CM | POA: Insufficient documentation

## 2022-06-01 DIAGNOSIS — I480 Paroxysmal atrial fibrillation: Secondary | ICD-10-CM

## 2022-06-01 NOTE — Progress Notes (Addendum)
Patient referred by [name], Georgina Quint, MD for pulmonary hypertension, preoperative risk stratification  Subjective:   Vernon Bishop, male    DOB: 09-14-1940, 82 y.o.   MRN: [id number]  Chief Complaint  Patient presents with   Bradycardia   Follow-up   Cerebrovascular Accident   Leg Swelling     HPI  82 y.o. Caucasian male with hypertension, OSA, remote h/o Afib, pontine stroke (05/2021), pulmonary hypertension  Patient is doing well.  He had an ER visit few weeks ago after taking a dose of Flexeril.  He was feeling loopy and confused, was reportedly hypertensive 150/30 when picked up by EMS, but blood pressure and heart rate improved by the time he reached ER.  Heart rate was still in 40s though.  Reviewed recent monitor results with the patient.  Details below.  He takes Lasix as needed.  He does endorse left leg edema.    Initial consultation visit 12/2020: Patient is here with his wife today.  He was originally scheduled to undergo L4-L5 OLIF surgery with Dr. Shon Baton, assisted by Dr. Chestine Spore for abdominal exposure.  However, surgery was delayed due to recent finding of pulmonary hypertension on echocardiogram.  He was referred to me for evaluation and management of the same, along with preoperative cardiac risk stratification.  Patient is retired, previously worked as a Industrial/product designer.  Currently, his physical activity is significantly limited due to his back pain.  He walks with a cane or a grocery store cart when he is shopping.  He does not have to climb a flight of stairs anywhere.  He walks with a slow pace.  With this level of physical activity, he denies any significant chest pain or shortness of breath symptoms.  He has had longstanding history of OSA, on CPAP for at least 10 years.  He quit smoking in 1970s.  He had atrial fibrillation in 1992, after which he was hospitalized for a day.  He had spontaneous self conversion back to sinus rhythm.  He did report significant  palpitations with his A. fib in 1992.  Since then, he has not experienced similar palpitation symptoms, but does report "skipped beat" every now and then.   Current Outpatient Medications:    acetaminophen (TYLENOL) 325 MG tablet, Take 325-650 mg by mouth every 6 (six) hours as needed for mild pain or headache., Disp: , Rfl:    amLODipine (NORVASC) 5 MG tablet, TAKE 1 TABLET BY MOUTH DAILY., Disp: 90 tablet, Rfl: 3   apixaban (ELIQUIS) 5 MG TABS tablet, Take 1 tablet (5 mg total) by mouth 2 (two) times daily., Disp: 180 tablet, Rfl: 3   Cholecalciferol (VITAMIN D3 PO), Take 1 tablet by mouth daily., Disp: , Rfl:    clotrimazole-betamethasone (LOTRISONE) cream, Apply 1 application. topically 2 (two) times daily. (Patient taking differently: Apply 1 application  topically daily as needed (to affected areas of the face).), Disp: 45 g, Rfl: 1   Cyanocobalamin (VITAMIN B-12 CR) 1000 MCG TBCR, Take 1,000 mcg by mouth daily.  , Disp: , Rfl:    ezetimibe (ZETIA) 10 MG tablet, Take 1 tablet (10 mg total) by mouth daily., Disp: 30 tablet, Rfl: 3   FLUoxetine (PROZAC) 40 MG capsule, Take 1 capsule (40 mg total) by mouth daily., Disp: 90 capsule, Rfl: 2   furosemide (LASIX) 20 MG tablet, TAKE 1 TABLET (20 MG TOTAL) BY MOUTH DAILY. (Patient taking differently: Take 20 mg by mouth daily as needed for edema.), Disp: 30 tablet, Rfl:  3   gabapentin (NEURONTIN) 300 MG capsule, Take 1 capsule (300 mg total) by mouth 3 (three) times daily., Disp: 270 capsule, Rfl: 3   olmesartan-hydrochlorothiazide (BENICAR HCT) 20-12.5 MG tablet, Take 1 tablet by mouth daily., Disp: 90 tablet, Rfl: 3   oxyCODONE-acetaminophen (PERCOCET) 10-325 MG tablet, Take 1 tablet by mouth every 6 (six) hours as needed for pain., Disp: 15 tablet, Rfl: 0   pravastatin (PRAVACHOL) 40 MG tablet, Take 1 tablet (40 mg total) by mouth every evening., Disp: 90 tablet, Rfl: 3   PRESCRIPTION MEDICATION, CPAP- At bedtime, Disp: , Rfl:    propranolol ER  (INDERAL LA) 80 MG 24 hr capsule, Take 1 capsule (80 mg total) by mouth daily., Disp: 90 capsule, Rfl: 3   sodium chloride (OCEAN) 0.65 % SOLN nasal spray, Place 1 spray into both nostrils as needed for congestion., Disp: , Rfl:    tetrahydrozoline 0.05 % ophthalmic solution, Place 1 drop into both eyes 2 (two) times daily as needed (for redness)., Disp: , Rfl:    traZODone (DESYREL) 50 MG tablet, Take 1-2 tablets (50-100 mg total) by mouth at bedtime., Disp: 60 tablet, Rfl: 5   triamcinolone ointment (KENALOG) 0.1 %, Apply 1 Application topically 4 (four) times daily., Disp: 160 g, Rfl: 2   zinc gluconate 50 MG tablet, Take 50 mg by mouth daily., Disp: , Rfl:    Cardiovascular and other pertinent studies:  EKG 06/01/2022: Sinus bradycardia 49 bpm   Mobile cardiac telemetry 14 days 01/22/2022 - 02/05/2022: Dominant rhythm: Sinus. HR 37-71 bpm. Avg HR 51 bpm, in sinus rhythm. 28 episodes of atrial tachycardia, fastest at 146 bpm for 11 beats, longest for 11 secs at 113 bpm. <1% isolated SVE, couplet/triplets. 1 episode of VT, at 121 bpm for 6 beats. <1% isolated VE, couplets. No atrial fibrillation/atrial flutter/high grade AV block, sinus pause >3sec noted. 0 patient triggered events.    Mobile cardiac telemetry 13 days 04/25/2021 - 05/09/2021: Dominant rhythm: Sinus. HR 36-152 bpm. Avg HR 52 bpm. 52 episodes of atrial tachycardia/SVT, fastest at 135 bpm for 13 beats, longest for 15.9 secs at 100 bpm. 2.6% isolated SVE, <1% couplet/triplets. 1 episode of VT, at 105 bpm for 5 beats <1% isolated VE, couplet/triplets. No atrial fibrillation/atrial flutter/high grade AV block, sinus pause >3sec noted. 0 patient triggered events.    MRI brain 06/08/2021: Brain: A 7 mm acute infarct is present in the left paramedian pons. A separate, smaller chronic infarct is noted more laterally in the pons on the left. T2 hyperintensities in the cerebral white matter bilaterally are nonspecific but  compatible with minimal chronic small vessel ischemic disease, not advanced for age. No intracranial hemorrhage, mass, midline shift, or extra-axial fluid collection is identified. There is no abnormal intracranial enhancement. There is mild generalized cerebral atrophy which is within normal limits for age.   Vascular: Major intracranial vascular flow voids are preserved.   Skull and upper cervical spine: Unremarkable bone marrow signal.   Sinuses/Orbits: Bilateral cataract extraction. Clear paranasal sinuses. Trace right mastoid fluid.   Other: None.   IMPRESSION: Small acute and chronic pontine infarcts.  CTA head/neck 06/08/2021: 1. Negative CTA for large vessel occlusion or other emergent finding. 2. Moderate atheromatous disease involving the intracranial posterior circulation without correctable stenosis. The left PCA is severely attenuated and only faintly visible, but remains grossly patent to its distal aspect. Predominant fetal type origin of the right PCA. 3. Wide patency of the major arterial vasculature of the neck.  Lexiscan Tetrofosmin stress test 01/31/2021: Lexiscan nuclear stress test performed using 1-day protocol. SPECT images show decreased tracer uptake in inferior myocardium, more prominent at rest. In absence of myocardial thickening or wall motion abnormality, this likely represents attenuation artifact. Stress LVEF 52%. Low risk study.  Echocardiogram 01/15/2021:  1. Left ventricular ejection fraction, by estimation, is 55 to 60%. The  left ventricle has normal function. The left ventricle has no regional  wall motion abnormalities. Left ventricular diastolic parameters are  consistent with Grade I diastolic dysfunction (impaired relaxation).   2. Right ventricular systolic function is normal. The right ventricular  size is normal. There is moderately elevated pulmonary artery systolic  pressure. The estimated right ventricular systolic pressure  is 47.2 mmHg.   3. Left atrial size was moderately dilated.   4. The mitral valve is normal in structure. Trivial mitral valve  regurgitation. No evidence of mitral stenosis.   5. The aortic valve is normal in structure. Aortic valve regurgitation is  trivial. No aortic stenosis is present.   6. Aortic dilatation noted. There is mild dilatation of the ascending  aorta, measuring 40 mm.   7. The inferior vena cava is normal in size with greater than 50%  respiratory variability, suggesting right atrial pressure of 3 mmHg.   Comparison(s): Changes from prior study are noted. 05/09/12 EF 55-60%. PA  pressure .   Recent labs: 02/02/2022: TSH 2.3  11/28/2021: Glucose 87, BUN/Cr 18/0.76. EGFR 84. Na/K 137/3.7. Rest of the CMP normal H/H 13/38. MCV 92. Platelets 178  March-April 2023: HbA1C 5.0% Chol 157, TG 99, HDL 33, LDL 104 TSH 2.1 normal    Review of Systems  Cardiovascular:  Negative for chest pain, dyspnea on exertion, leg swelling, palpitations and syncope.  Musculoskeletal:  Positive for back pain.         Vitals:   06/01/22 1443  BP: (!) 128/58  Pulse: 76  SpO2: 100%     Body mass index is 30.71 kg/m. Filed Weights   06/01/22 1443  Weight: 226 lb 6.4 oz (102.7 kg)     Objective:   Physical Exam Vitals and nursing note reviewed.  Constitutional:      General: He is not in acute distress. Neck:     Vascular: No JVD.  Cardiovascular:     Rate and Rhythm: Regular rhythm. Bradycardia present.     Heart sounds: Normal heart sounds. No murmur heard. Pulmonary:     Effort: Pulmonary effort is normal.     Breath sounds: Normal breath sounds. No wheezing or rales.  Musculoskeletal:     Right lower leg: Edema (Trace) present.     Left lower leg: Edema (1+) present.      ICD-10-CM   1. Paroxysmal atrial fibrillation (HCC)  I48.0 EKG 12-Lead    PCV ECHOCARDIOGRAM COMPLETE    2. Mixed hyperlipidemia  E78.2 EKG 12-Lead    3. Essential hypertension   I10 PCV ECHOCARDIOGRAM COMPLETE           Assessment & Recommendations:    82 y.o. Caucasian male with hypertension, OSA, remote h/o Afib, pontine stroke (05/2021), pulmonary hypertension  Bradycardia: Average resting heart rate 51 bpm on recent Zio patch.  No significant AV conduction abnormality. Normal TSH, not on AV conduction blocking agents. At this time, no indication for pacemaker.  It is possible that in his lifetime, he may develop further sinus node dysfunction.  We will monitor closely with serial EKGs at future visits.  Stroke: Small acute  on chronic pontine infarcts discovered in March 2023. Known remote history of A-fib.  Even though cardiac telemetry (2023) did not show A-fib, most likely etiology of his multifocal stroke is paroxysmal A-fib. High CHA2DS2VASc score.  Continue Eliquis 5 mg twice daily. He did not tolerate atorvastatin due to severe fatigue.  LDL remains >100 on pravastatin 20 mg daily.  Increase pravastatin to 40 mg daily added Zetia 10 mg daily.  Repeat lipid panel in 3 months.    Pulmonary hypertension: I personally reviewed and independently interpreted prior EKG as an echocardiogram. Patient has moderate left atrial dilatation, grade 1 diastolic dysfunction with preserved LVEF, no right-sided dilation, moderate TR, with estimated RVSP 45 mmHg. 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   He is clinically stable and does not need any further cardiac workup at this time.  Recommend taking Lasix 20 mg every day.  Will check echocardiogram in next few weeks.  Hypertension: Generally well controlled.  F/u in 3 months    Elder Negus, MD Pager: [phone] Office: [phone]

## 2022-06-05 ENCOUNTER — Ambulatory Visit: Payer: PPO

## 2022-06-05 DIAGNOSIS — I10 Essential (primary) hypertension: Secondary | ICD-10-CM | POA: Diagnosis not present

## 2022-06-05 DIAGNOSIS — I480 Paroxysmal atrial fibrillation: Secondary | ICD-10-CM

## 2022-06-09 ENCOUNTER — Ambulatory Visit (INDEPENDENT_AMBULATORY_CARE_PROVIDER_SITE_OTHER): Payer: PPO | Admitting: Internal Medicine

## 2022-06-09 ENCOUNTER — Encounter: Payer: Self-pay | Admitting: Internal Medicine

## 2022-06-09 VITALS — BP 120/60 | HR 55 | Temp 97.9°F | Ht 72.0 in | Wt 223.0 lb

## 2022-06-09 DIAGNOSIS — Z7984 Long term (current) use of oral hypoglycemic drugs: Secondary | ICD-10-CM

## 2022-06-09 DIAGNOSIS — E538 Deficiency of other specified B group vitamins: Secondary | ICD-10-CM | POA: Diagnosis not present

## 2022-06-09 DIAGNOSIS — F411 Generalized anxiety disorder: Secondary | ICD-10-CM

## 2022-06-09 DIAGNOSIS — E119 Type 2 diabetes mellitus without complications: Secondary | ICD-10-CM

## 2022-06-09 DIAGNOSIS — I10 Essential (primary) hypertension: Secondary | ICD-10-CM

## 2022-06-09 NOTE — Assessment & Plan Note (Signed)
Stable. Cont w/Vit B12

## 2022-06-09 NOTE — Assessment & Plan Note (Signed)
Pt lost wt on diet - lost 47  And gain 23 lbs again

## 2022-06-09 NOTE — Assessment & Plan Note (Signed)
BP Readings from Last 3 Encounters:  06/09/22 120/60  06/01/22 (!) 128/58  05/29/22 124/72

## 2022-06-09 NOTE — Progress Notes (Signed)
Subjective:  Patient ID: Vernon Bishop, male    DOB: 07/07/1940  Age: 82 y.o. MRN: [id number]  CC: No chief complaint on file.   HPI Vernon Bishop presents for chronic LBP, OSA, HTN, pulm HTN  Outpatient Medications Prior to Visit  Medication Sig Dispense Refill   acetaminophen (TYLENOL) 325 MG tablet Take 325-650 mg by mouth every 6 (six) hours as needed for mild pain or headache.     amLODipine (NORVASC) 5 MG tablet TAKE 1 TABLET BY MOUTH DAILY. 90 tablet 3   Cholecalciferol (VITAMIN D3 PO) Take 1 tablet by mouth daily.     clotrimazole-betamethasone (LOTRISONE) cream Apply 1 application. topically 2 (two) times daily. (Patient taking differently: Apply 1 application  topically daily as needed (to affected areas of the face).) 45 g 1   Cyanocobalamin (VITAMIN B-12 CR) 1000 MCG TBCR Take 1,000 mcg by mouth daily.       ezetimibe (ZETIA) 10 MG tablet Take 1 tablet (10 mg total) by mouth daily. 30 tablet 3   FLUoxetine (PROZAC) 40 MG capsule Take 1 capsule (40 mg total) by mouth daily. 90 capsule 2   gabapentin (NEURONTIN) 300 MG capsule Take 1 capsule (300 mg total) by mouth 3 (three) times daily. 270 capsule 3   olmesartan-hydrochlorothiazide (BENICAR HCT) 20-12.5 MG tablet Take 1 tablet by mouth daily. 90 tablet 3   oxyCODONE-acetaminophen (PERCOCET) 10-325 MG tablet Take 1 tablet by mouth every 6 (six) hours as needed for pain. (Patient not taking: Reported on 06/01/2022) 15 tablet 0   pravastatin (PRAVACHOL) 40 MG tablet Take 1 tablet (40 mg total) by mouth every evening. 90 tablet 3   PRESCRIPTION MEDICATION CPAP- At bedtime     propranolol ER (INDERAL LA) 80 MG 24 hr capsule Take 1 capsule (80 mg total) by mouth daily. 90 capsule 3   sodium chloride (OCEAN) 0.65 % SOLN nasal spray Place 1 spray into both nostrils as needed for congestion.     tetrahydrozoline 0.05 % ophthalmic solution Place 1 drop into both eyes 2 (two) times daily as needed (for redness).     traZODone (DESYREL)  50 MG tablet Take 1-2 tablets (50-100 mg total) by mouth at bedtime. 60 tablet 5   triamcinolone ointment (KENALOG) 0.1 % Apply 1 Application topically 4 (four) times daily. 160 g 2   zinc gluconate 50 MG tablet Take 50 mg by mouth daily.     apixaban (ELIQUIS) 5 MG TABS tablet Take 1 tablet (5 mg total) by mouth 2 (two) times daily. 180 tablet 3   furosemide (LASIX) 20 MG tablet TAKE 1 TABLET (20 MG TOTAL) BY MOUTH DAILY. (Patient taking differently: Take 20 mg by mouth as needed for edema.) 30 tablet 3   No facility-administered medications prior to visit.    ROS: Review of Systems  Constitutional:  Positive for fatigue. Negative for appetite change and unexpected weight change.  HENT:  Negative for congestion, nosebleeds, sneezing, sore throat and trouble swallowing.   Eyes:  Negative for itching and visual disturbance.  Respiratory:  Negative for cough.   Cardiovascular:  Negative for chest pain, palpitations and leg swelling.  Gastrointestinal:  Negative for abdominal distention, blood in stool, diarrhea and nausea.  Genitourinary:  Negative for frequency and hematuria.  Musculoskeletal:  Positive for back pain. Negative for gait problem, joint swelling and neck pain.  Skin:  Negative for rash.  Neurological:  Negative for dizziness, tremors, speech difficulty and weakness.  Psychiatric/Behavioral:  Positive for decreased  concentration. Negative for agitation, dysphoric mood and sleep disturbance. The patient is not nervous/anxious.     Objective:  BP 120/60 (BP Location: Left Arm, Patient Position: Sitting, Cuff Size: Normal)   Pulse (!) 55   Temp 97.9 F (36.6 C) (Oral)   Ht 6' (1.829 m)   Wt 223 lb (101.2 kg)   SpO2 91%   BMI 30.24 kg/m   BP Readings from Last 3 Encounters:  06/09/22 120/60  06/01/22 (!) 128/58  05/29/22 124/72    Wt Readings from Last 3 Encounters:  06/09/22 223 lb (101.2 kg)  06/01/22 226 lb 6.4 oz (102.7 kg)  05/29/22 226 lb (102.5 kg)     Physical Exam Constitutional:      General: He is not in acute distress.    Appearance: He is well-developed. He is obese.     Comments: NAD  Eyes:     Conjunctiva/sclera: Conjunctivae normal.     Pupils: Pupils are equal, round, and reactive to light.  Neck:     Thyroid: No thyromegaly.     Vascular: No JVD.  Cardiovascular:     Rate and Rhythm: Normal rate and regular rhythm.     Heart sounds: Normal heart sounds. No murmur heard.    No friction rub. No gallop.  Pulmonary:     Effort: Pulmonary effort is normal. No respiratory distress.     Breath sounds: Normal breath sounds. No wheezing or rales.  Chest:     Chest wall: No tenderness.  Abdominal:     General: Bowel sounds are normal. There is no distension.     Palpations: Abdomen is soft. There is no mass.     Tenderness: There is no abdominal tenderness. There is no guarding or rebound.  Musculoskeletal:        General: No tenderness. Normal range of motion.     Cervical back: Normal range of motion.  Lymphadenopathy:     Cervical: No cervical adenopathy.  Skin:    General: Skin is warm and dry.     Findings: No rash.  Neurological:     Mental Status: He is alert and oriented to person, place, and time.     Cranial Nerves: No cranial nerve deficit.     Motor: No abnormal muscle tone.     Coordination: Coordination abnormal.     Gait: Gait abnormal.     Deep Tendon Reflexes: Reflexes are normal and symmetric.  Psychiatric:        Behavior: Behavior normal.        Thought Content: Thought content normal.        Judgment: Judgment normal.    Antalgic gait; using a cane LS stiff  Lab Results  Component Value Date   WBC 6.0 04/22/2022   HGB 10.9 (L) 04/22/2022   HCT 32.0 (L) 04/22/2022   PLT 171 04/22/2022   GLUCOSE 76 04/22/2022   CHOL 119 05/20/2022   TRIG 102 05/20/2022   HDL 35 (L) 05/20/2022   LDLCALC 65 05/20/2022   ALT 18 04/22/2022   AST 21 04/22/2022   NA 134 (L) 04/22/2022   K 4.5  04/22/2022   CL 99 04/22/2022   CREATININE 0.90 04/22/2022   BUN 18 04/22/2022   CO2 24 04/22/2022   TSH 2.340 02/02/2022   PSA 0.73 12/20/2019   INR 1.2 04/22/2022   HGBA1C 5.0 06/09/2021   MICROALBUR 3.9 12/20/2019    MR BRAIN WO CONTRAST  Result Date: 04/22/2022 CLINICAL DATA:  Neuro deficit,  acute, stroke suspected. EXAM: MRI HEAD WITHOUT CONTRAST TECHNIQUE: Multiplanar, multiecho pulse sequences of the brain and surrounding structures were obtained without intravenous contrast. COMPARISON:  Head CT 04/22/2022 and MRI 06/08/2021 FINDINGS: Brain: There is no evidence of an acute infarct, intracranial hemorrhage, mass, midline shift, or extra-axial fluid collection. Mild cerebral atrophy is within normal limits for age. Small T2 hyperintensities in the cerebral white matter bilaterally are unchanged from the prior MRI and are nonspecific but compatible with minimal chronic small vessel ischemic disease, also considered to be within normal limits for age. Small chronic infarcts are again noted in the left pons. Vascular: Major intracranial vascular flow voids are preserved. Skull and upper cervical spine: Unremarkable bone marrow signal. Sinuses/Orbits: Bilateral cataract extraction. Moderate mucosal thickening in the right maxillary sinus and mild mucosal thickening in the ethmoid air cells bilaterally. Small right mastoid effusion. Other: None. IMPRESSION: 1. No acute intracranial abnormality. 2. Chronic pontine infarcts. Electronically Signed   By: Sebastian Ache M.D.   On: 04/22/2022 18:35   CT Head Wo Contrast  Result Date: 04/22/2022 CLINICAL DATA:  Headache.) EXAM: CT HEAD WITHOUT CONTRAST TECHNIQUE: Contiguous axial images were obtained from the base of the skull through the vertex without intravenous contrast. RADIATION DOSE REDUCTION: This exam was performed according to the departmental dose-optimization program which includes automated exposure control, adjustment of the mA and/or kV  according to patient size and/or use of iterative reconstruction technique. COMPARISON:  06/08/2021 FINDINGS: Brain: There is periventricular white matter decreased attenuation consistent with small vessel ischemic changes. Gray-white differentiation is preserved. No acute intracranial hemorrhage, mass effect or shift. No hydrocephalus. Vascular: No hyperdense vessel or unexpected calcification. Skull: Normal. Negative for fracture or focal lesion. Sinuses/Orbits: No acute finding. IMPRESSION: Periventricular white matter changes consistent with chronic small vessel ischemia. No acute intracranial process identified. Electronically Signed   By: Layla Maw M.D.   On: 04/22/2022 15:15   DG Chest Port 1 View  Result Date: 04/22/2022 CLINICAL DATA:  Weakness EXAM: PORTABLE CHEST 1 VIEW COMPARISON:  Chest x-ray dated January 10, 2021 FINDINGS: Cardiac and mediastinal contours within normal limits for AP technique. Left costophrenic angle is excluded from the field of view. Bibasilar atelectasis. No large pleural effusion or pneumothorax. IMPRESSION: No active disease. Electronically Signed   By: [name] M.D.   On: 04/22/2022 15:07    Assessment & Plan:   Problem List Items Addressed This Visit     Diabetes mellitus type 2, diet-controlled (HCC) - Primary     Pt lost wt on diet - lost 47  And gain 23 lbs again      Vitamin B12 deficiency    Stable. Cont w/Vit B12      Generalized anxiety disorder    Wellbutrin XL 350 mg/d, Prozac      HTN (hypertension)    BP Readings from Last 3 Encounters:  06/09/22 120/60  06/01/22 (!) 128/58  05/29/22 124/72           No orders of the defined types were placed in this encounter.     Follow-up: Return in about 3 months (around 09/09/2022) for a follow-up visit.  Sonda Primes, MD

## 2022-06-09 NOTE — Assessment & Plan Note (Signed)
Wellbutrin XL 350 mg/d, Prozac

## 2022-06-09 NOTE — Patient Instructions (Signed)
Start waking daily

## 2022-06-17 ENCOUNTER — Other Ambulatory Visit: Payer: Self-pay | Admitting: Internal Medicine

## 2022-06-30 ENCOUNTER — Ambulatory Visit: Payer: PPO | Admitting: Internal Medicine

## 2022-07-13 ENCOUNTER — Other Ambulatory Visit: Payer: Self-pay | Admitting: Cardiology

## 2022-07-13 DIAGNOSIS — I480 Paroxysmal atrial fibrillation: Secondary | ICD-10-CM

## 2022-07-14 DIAGNOSIS — M5414 Radiculopathy, thoracic region: Secondary | ICD-10-CM | POA: Diagnosis not present

## 2022-07-14 DIAGNOSIS — M546 Pain in thoracic spine: Secondary | ICD-10-CM | POA: Diagnosis not present

## 2022-07-25 DIAGNOSIS — M5414 Radiculopathy, thoracic region: Secondary | ICD-10-CM | POA: Diagnosis not present

## 2022-08-02 ENCOUNTER — Encounter: Payer: Self-pay | Admitting: Internal Medicine

## 2022-08-04 DIAGNOSIS — M5136 Other intervertebral disc degeneration, lumbar region: Secondary | ICD-10-CM | POA: Diagnosis not present

## 2022-08-04 DIAGNOSIS — M5414 Radiculopathy, thoracic region: Secondary | ICD-10-CM | POA: Diagnosis not present

## 2022-08-20 ENCOUNTER — Other Ambulatory Visit (HOSPITAL_COMMUNITY): Payer: Self-pay | Admitting: Student

## 2022-08-20 DIAGNOSIS — M48062 Spinal stenosis, lumbar region with neurogenic claudication: Secondary | ICD-10-CM

## 2022-08-20 DIAGNOSIS — R269 Unspecified abnormalities of gait and mobility: Secondary | ICD-10-CM | POA: Diagnosis not present

## 2022-08-20 DIAGNOSIS — M47814 Spondylosis without myelopathy or radiculopathy, thoracic region: Secondary | ICD-10-CM | POA: Diagnosis not present

## 2022-08-23 ENCOUNTER — Ambulatory Visit (HOSPITAL_COMMUNITY)
Admission: RE | Admit: 2022-08-23 | Discharge: 2022-08-23 | Disposition: A | Discharge status: Home / Self Care | Payer: PPO | Source: Ambulatory Visit | Attending: Student | Admitting: Student

## 2022-08-23 DIAGNOSIS — M48062 Spinal stenosis, lumbar region with neurogenic claudication: Secondary | ICD-10-CM | POA: Insufficient documentation

## 2022-08-23 DIAGNOSIS — M48061 Spinal stenosis, lumbar region without neurogenic claudication: Secondary | ICD-10-CM | POA: Diagnosis not present

## 2022-08-25 DIAGNOSIS — M48062 Spinal stenosis, lumbar region with neurogenic claudication: Secondary | ICD-10-CM | POA: Diagnosis not present

## 2022-09-03 ENCOUNTER — Ambulatory Visit: Payer: PPO | Admitting: Cardiology

## 2022-09-03 ENCOUNTER — Other Ambulatory Visit: Payer: Self-pay | Admitting: Neurological Surgery

## 2022-09-04 ENCOUNTER — Encounter: Payer: Self-pay | Admitting: Cardiology

## 2022-09-07 NOTE — Pre-Procedure Instructions (Signed)
Surgical Instructions    Your procedure is scheduled on September 11, 2022.  Report to [facility] Main Entrance "A" at 8:40 A.M., then check in with the Admitting office.  Call this number if you have problems the morning of surgery:  [phone]  If you have any questions prior to your surgery date call [phone]: Open Monday-Friday 8am-4pm If you experience any cold or flu symptoms such as cough, fever, chills, shortness of breath, etc. between now and your scheduled surgery, please notify us at the above number.     Remember:  Do not eat or drink after midnight the night before your surgery     Take these medicines the morning of surgery with A SIP OF WATER:  amLODipine (NORVASC)   ezetimibe (ZETIA)   FLUoxetine (PROZAC)   gabapentin (NEURONTIN)   oxyCODONE-acetaminophen (PERCOCET)   propranolol ER (INDERAL LA)    May take these medicines IF NEEDED:  acetaminophen (TYLENOL)   sodium chloride (OCEAN) nasal spray   tetrahydrozoline ophthalmic solution    Follow your surgeon's instructions on when to stop ELIQUIS.  If no instructions were given by your surgeon then you will need to call the office to get those instructions.     As of today, STOP taking any Aspirin (unless otherwise instructed by your surgeon) Aleve, Naproxen, Ibuprofen, Motrin, Advil, Goody's, BC's, all herbal medications, fish oil, and all vitamins.                     Do NOT Smoke (Tobacco/Vaping) for 24 hours prior to your procedure.  If you use a CPAP at night, you may bring your mask/headgear for your overnight stay.   Contacts, glasses, piercing's, hearing aid's, dentures or partials may not be worn into surgery, please bring cases for these belongings.    For patients admitted to the hospital, discharge time will be determined by your treatment team.   Patients discharged the day of surgery will not be allowed to drive home, and someone needs to stay with them for 24 hours.  SURGICAL WAITING ROOM  VISITATION Patients having surgery or a procedure may have no more than 2 support people in the waiting area - these visitors may rotate.   Children under the age of 41 must have an adult with them who is not the patient. If the patient needs to stay at the hospital during part of their recovery, the visitor guidelines for inpatient rooms apply. Pre-op nurse will coordinate an appropriate time for 1 support person to accompany patient in pre-op.  This support person may not rotate.   Please refer to the [facility] website for the visitor guidelines for Inpatients (after your surgery is over and you are in a regular room).   If you received a COVID test during your pre-op visit  it is requested that you wear a mask when out in public, stay away from anyone that may not be feeling well and notify your surgeon if you develop symptoms. If you have been in contact with anyone that has tested positive in the last 10 days please notify you surgeon.     Pre-operative 5 CHG Bath Instructions   You can play a key role in reducing the risk of infection after surgery. Your skin needs to be as free of germs as possible. You can reduce the number of germs on your skin by washing with CHG (chlorhexidine gluconate) soap before surgery. CHG is an antiseptic soap that kills germs and continues to  kill germs even after washing.   DO NOT use if you have an allergy to chlorhexidine/CHG or antibacterial soaps. If your skin becomes reddened or irritated, stop using the CHG and notify one of our RNs at [phone].   Please shower with the CHG soap starting 4 days before surgery using the following schedule:     Please keep in mind the following:  DO NOT shave, including legs and underarms, starting the day of your first shower.   You may shave your face at any point before/day of surgery.  Place clean sheets on your bed the day you start using CHG soap. Use a clean washcloth (not used since being washed) for  each shower. DO NOT sleep with pets once you start using the CHG.   CHG Shower Instructions:  If you choose to wash your hair and private area, wash first with your normal shampoo/soap.  After you use shampoo/soap, rinse your hair and body thoroughly to remove shampoo/soap residue.  Turn the water OFF and apply about 3 tablespoons (45 ml) of CHG soap to a CLEAN washcloth.  Apply CHG soap ONLY FROM YOUR NECK DOWN TO YOUR TOES (washing for 3-5 minutes)  DO NOT use CHG soap on face, private areas, open wounds, or sores.  Pay special attention to the area where your surgery is being performed.  If you are having back surgery, having someone wash your back for you may be helpful. Wait 2 minutes after CHG soap is applied, then you may rinse off the CHG soap.  Pat dry with a clean towel  Put on clean clothes/pajamas   If you choose to wear lotion, please use ONLY the CHG-compatible lotions on the back of this paper.     Additional instructions for the day of surgery: DO NOT APPLY any lotions, deodorants, cologne, or perfumes.   Do not wear jewelry or makeup Do not wear nail polish, gel polish, artificial nails, or any other type of covering on natural nails (fingers and toes) Do not bring valuables to the hospital. [facility] is not responsible for any belongings or valuables. Put on clean/comfortable clothes.  Brush your teeth.  Ask your nurse before applying any prescription medications to the skin.      CHG Compatible Lotions   Aveeno Moisturizing lotion  Cetaphil Moisturizing Cream  Cetaphil Moisturizing Lotion  Clairol Herbal Essence Moisturizing Lotion, Dry Skin  Clairol Herbal Essence Moisturizing Lotion, Extra Dry Skin  Clairol Herbal Essence Moisturizing Lotion, Normal Skin  Curel Age Defying Therapeutic Moisturizing Lotion with Alpha Hydroxy  Curel Extreme Care Body Lotion  Curel Soothing Hands Moisturizing Hand Lotion  Curel Therapeutic Moisturizing Cream,  Fragrance-Free  Curel Therapeutic Moisturizing Lotion, Fragrance-Free  Curel Therapeutic Moisturizing Lotion, Original Formula  Eucerin Daily Replenishing Lotion  Eucerin Dry Skin Therapy Plus Alpha Hydroxy Crme  Eucerin Dry Skin Therapy Plus Alpha Hydroxy Lotion  Eucerin Original Crme  Eucerin Original Lotion  Eucerin Plus Crme Eucerin Plus Lotion  Eucerin TriLipid Replenishing Lotion  Keri Anti-Bacterial Hand Lotion  Keri Deep Conditioning Original Lotion Dry Skin Formula Softly Scented  Keri Deep Conditioning Original Lotion, Fragrance Free Sensitive Skin Formula  Keri Lotion Fast Absorbing Fragrance Free Sensitive Skin Formula  Keri Lotion Fast Absorbing Softly Scented Dry Skin Formula  Keri Original Lotion  Keri Skin Renewal Lotion Keri Silky Smooth Lotion  Keri Silky Smooth Sensitive Skin Lotion  Nivea Body Creamy Conditioning Oil  Nivea Body Extra Enriched Insurance claims handler  Body Sheer Moisturizing Lotion Nivea Crme  Nivea Skin Firming Lotion  NutraDerm 30 Skin Lotion  NutraDerm Skin Lotion  NutraDerm Therapeutic Skin Cream  NutraDerm Therapeutic Skin Lotion  ProShield Protective Hand Cream  Provon moisturizing lotion    Please read over the following fact sheets that you were given.

## 2022-09-08 ENCOUNTER — Encounter (HOSPITAL_COMMUNITY): Payer: Self-pay

## 2022-09-08 ENCOUNTER — Other Ambulatory Visit: Payer: Self-pay

## 2022-09-08 ENCOUNTER — Encounter (HOSPITAL_COMMUNITY)
Admission: RE | Admit: 2022-09-08 | Discharge: 2022-09-08 | Disposition: A | Discharge status: Home / Self Care | Payer: PPO | Source: Ambulatory Visit | Attending: Neurological Surgery | Admitting: Neurological Surgery

## 2022-09-08 VITALS — BP 135/48 | HR 46 | Temp 98.3°F | Resp 17 | Ht 72.0 in | Wt 226.0 lb

## 2022-09-08 DIAGNOSIS — D649 Anemia, unspecified: Secondary | ICD-10-CM | POA: Insufficient documentation

## 2022-09-08 DIAGNOSIS — Z01818 Encounter for other preprocedural examination: Secondary | ICD-10-CM

## 2022-09-08 DIAGNOSIS — Z8673 Personal history of transient ischemic attack (TIA), and cerebral infarction without residual deficits: Secondary | ICD-10-CM | POA: Insufficient documentation

## 2022-09-08 DIAGNOSIS — I272 Pulmonary hypertension, unspecified: Secondary | ICD-10-CM | POA: Insufficient documentation

## 2022-09-08 DIAGNOSIS — I119 Hypertensive heart disease without heart failure: Secondary | ICD-10-CM | POA: Diagnosis not present

## 2022-09-08 DIAGNOSIS — I2510 Atherosclerotic heart disease of native coronary artery without angina pectoris: Secondary | ICD-10-CM | POA: Insufficient documentation

## 2022-09-08 DIAGNOSIS — Z01812 Encounter for preprocedural laboratory examination: Secondary | ICD-10-CM | POA: Diagnosis not present

## 2022-09-08 DIAGNOSIS — G4733 Obstructive sleep apnea (adult) (pediatric): Secondary | ICD-10-CM | POA: Diagnosis not present

## 2022-09-08 LAB — BASIC METABOLIC PANEL
Anion gap: 11 (ref 5–15)
BUN: 19 mg/dL (ref 8–23)
CO2: 23 mmol/L (ref 22–32)
Calcium: 9 mg/dL (ref 8.9–10.3)
Chloride: 104 mmol/L (ref 98–111)
Creatinine, Ser: 0.94 mg/dL (ref 0.61–1.24)
GFR, Estimated: >60 mL/min (ref >60)
Glucose, Bld: 89 mg/dL (ref 70–99)
Potassium: 3.8 mmol/L (ref 3.5–5.1)
Sodium: 138 mmol/L (ref 135–145)

## 2022-09-08 LAB — CBC
HCT: 36.9 % — ABNORMAL LOW (ref 39.0–52.0)
Hemoglobin: 12.4 g/dL — ABNORMAL LOW (ref 13.0–17.0)
MCH: 30.5 pg (ref 26.0–34.0)
MCHC: 33.6 g/dL (ref 30.0–36.0)
MCV: 90.9 fL (ref 80.0–100.0)
Platelets: 171 10*3/uL (ref 150–400)
RBC: 4.06 MIL/uL — ABNORMAL LOW (ref 4.22–5.81)
RDW: 12.7 % (ref 11.5–15.5)
WBC: 5.5 10*3/uL (ref 4.0–10.5)
nRBC: 0 % (ref 0.0–0.2)

## 2022-09-08 LAB — SURGICAL PCR SCREEN
MRSA, PCR: NEGATIVE
Staphylococcus aureus: POSITIVE — AB

## 2022-09-08 NOTE — Progress Notes (Signed)
PCP - Dr. Jacinta Shoe Cardiologist - Dr. Truett Mainland (Last Office Visit 06/01/2022)  PPM/ICD - Denies Device Orders - n/a  Rep Notified - n/a  Chest x-ray - 04/22/2022 EKG - 06/01/2022 Stress Test - 01/29/2021 ECHO - 06/05/2022 Cardiac Cath - Per pt, many years ago. Result normal  Sleep Study - +OSA. Pt wears CPAP nightly. Pressure setting is 12  No DM  Last dose of GLP1 agonist- n/a GLP1 instructions: n/a  Blood Thinner Instructions: Pt instructed to stop Eliquis 5 days prior to surgery. His last dose was the morning of June 1st. Aspirin Instructions: n/a  NPO after midnight  COVID TEST- n/a   Anesthesia review: Yes. Cardiac Hx including HTN, A.Fib, Bradycardia, and CVA with tongue numbness and occasional slurred speech.    Patient denies shortness of breath, fever, cough and chest pain at PAT appointment. Pt denies any respiratory illness/infection in the last two months.   All instructions explained to the patient, with a verbal understanding of the material. Patient agrees to go over the instructions while at home for a better understanding. Patient also instructed to self quarantine after being tested for COVID-19. The opportunity to ask questions was provided.

## 2022-09-09 ENCOUNTER — Ambulatory Visit: Payer: PPO | Admitting: Internal Medicine

## 2022-09-09 NOTE — Progress Notes (Signed)
Anesthesia Chart Review:  82 year old male with pertinent history including HTN, OSA on CPAP, remote history of A-fib, pontine stroke (05/2021), pulmonary hypertension, asymptomatic bradycardia without significant AV conduction abnormality.  Last seen by cardiologist Dr. Rosemary Holms on 06/01/2022.  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  He is clinically stable and does not need any further cardiac workup at this time. Recommend taking Lasix 20 mg every day.  Will check echocardiogram in next few weeks."  Echo 06/05/2022 showed EF 60 to 65%, no significant valvular disease.  Clearance letter from Dr. Rosemary Holms dated 09/04/2022 states, "Vernon Bishop is at moderate risk, from a cardiac standpoint, for his upcoming procedure: T11-12, L1-2 laminectomy.  It is ok to proceed without further cardiac testing. If applicable can hold Eliquis for 2  day(s) prior to procedure and re-start 1  day post procedure."  Patient reports he was instructed to hold Eliquis 5 days prior to surgery.   Preop labs reviewed, mild anemia with hemoglobin 12.4, otherwise unremarkable.  EKG 06/01/2022: Sinus bradycardia.  Rate 49.  Echocardiogram 06/05/2022:  Normal LV systolic function with visual EF 60-65%. Left ventricle cavity  is normal in size. Normal left ventricular wall thickness. Normal global  wall motion. Unable to evaluate diastolic function due to suboptimal image  acquisition.  Left atrial cavity is mildly dilated at 36.84 ml/m^2.  No significant valvular heart disease.  Proximal ascending aorta (34mm)  measure within normal limits.  Compared to 01/15/2021 moderate LAE is now mild, proximal As Ao dilatation  and moderate PHTN are not appreciated on current study.   Mobile cardiac telemetry 14 days 01/22/2022 - 02/05/2022: Dominant rhythm: Sinus. HR 37-71 bpm. Avg HR 51 bpm, in sinus rhythm. 28 episodes of atrial tachycardia, fastest at 146 bpm for 11 beats, longest for 11 secs at 113 bpm. <1% isolated SVE, couplet/triplets. 1 episode of VT, at 121 bpm for 6 beats. <1% isolated VE, couplets. No atrial fibrillation/atrial flutter/high grade AV block, sinus pause >3sec noted. 0 patient triggered events.   TTE with bubble study 06/09/2021:  1. Left ventricular ejection fraction, by estimation, is 55 to 60%. The  left ventricle has normal function. The left ventricle has no regional  wall motion abnormalities. There is mild concentric left ventricular  hypertrophy. Left ventricular diastolic  parameters are consistent with Grade II diastolic dysfunction  (pseudonormalization).   2. Right ventricular systolic function is normal. The right ventricular  size is normal. There is normal pulmonary artery systolic pressure. The  estimated right ventricular systolic pressure is 32.6 mmHg.   3. Left atrial size was severely dilated.   4. Right atrial size was mildly dilated.   5. The mitral valve is normal in structure. Trivial mitral valve  regurgitation. No evidence of mitral stenosis.   6. The aortic valve is tricuspid. Aortic valve regurgitation is trivial.  Aortic valve sclerosis is present, with no evidence of aortic valve  stenosis. Aortic valve area, by VTI measures 2.27 cm. Aortic valve mean  gradient measures 5.0 mmHg. Aortic  valve Vmax measures 1.62 m/s.   7. The inferior vena cava is dilated in size with >50% respiratory  variability, suggesting right atrial  pressure of 8 mmHg.    Lexiscan Tetrofosmin stress test 01/31/2021: Lexiscan nuclear stress test performed using 1-day  protocol. SPECT images show decreased tracer uptake in inferior myocardium, more prominent at rest. In absence of myocardial thickening or wall motion abnormality, this likely represents attenuation artifact. Stress LVEF 52%. Low risk study.     Vernon Bishop [facility] Short Stay Center/Anesthesiology Phone [phone] 09/09/2022 9:54 AM

## 2022-09-09 NOTE — Anesthesia Preprocedure Evaluation (Addendum)
Anesthesia Evaluation  Patient identified by MRN, date of birth, ID band Patient awake    Reviewed: Allergy & Precautions, NPO status , Patient's Chart, lab work & pertinent test results  Airway Mallampati: III  TM Distance: >3 FB Neck ROM: Limited    Dental  (+) Dental Advisory Given   Pulmonary sleep apnea and Continuous Positive Airway Pressure Ventilation , former smoker   breath sounds clear to auscultation       Cardiovascular hypertension, Pt. on medications and Pt. on home beta blockers + dysrhythmias Atrial Fibrillation  Rhythm:Regular Rate:Normal     Neuro/Psych  Neuromuscular disease CVA    GI/Hepatic negative GI ROS, Neg liver ROS,,,  Endo/Other  diabetes, Type 2    Renal/GU negative Renal ROS     Musculoskeletal  (+) Arthritis ,    Abdominal   Peds  Hematology  (+) Blood dyscrasia, anemia   Anesthesia Other Findings   Reproductive/Obstetrics                             Anesthesia Physical Anesthesia Plan  ASA: 3  Anesthesia Plan: General   Post-op Pain Management: Tylenol PO (pre-op)*   Induction: Intravenous  PONV Risk Score and Plan: 2 and Dexamethasone, Ondansetron and Treatment may vary due to age or medical condition  Airway Management Planned: Oral ETT  Additional Equipment: None  Intra-op Plan:   Post-operative Plan: Extubation in OR  Informed Consent: I have reviewed the patients History and Physical, chart, labs and discussed the procedure including the risks, benefits and alternatives for the proposed anesthesia with the patient or authorized representative who has indicated his/her understanding and acceptance.     Dental advisory given  Plan Discussed with: CRNA  Anesthesia Plan Comments: (PAT note by Antionette Poles, PA-C: 82 year old male with pertinent history including HTN, OSA on CPAP, remote history of A-fib, pontine stroke (05/2021), pulmonary  hypertension, asymptomatic bradycardia without significant AV conduction abnormality.  Last seen by cardiologist Dr. Rosemary Holms on 06/01/2022.  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  He is clinically stable and does not need any further cardiac workup at this time. Recommend taking Lasix 20 mg every day.  Will check echocardiogram in next few weeks."  Echo 06/05/2022 showed EF 60 to 65%, no significant valvular disease.  Clearance letter from Dr. Rosemary Holms dated 09/04/2022 states, "Vernon Bishop is at moderate risk, from a cardiac standpoint, for his upcoming procedure: T11-12, L1-2 laminectomy.  It is ok to proceed without further cardiac testing. If applicable can hold Eliquis for 2  day(s) prior to procedure and re-start 1  day post procedure."  Patient reports he was instructed to hold Eliquis 5 days prior to surgery.  Preop labs reviewed, mild anemia with hemoglobin 12.4, otherwise unremarkable.  EKG 06/01/2022: Sinus bradycardia.  Rate 49.  Echocardiogram 06/05/2022:  Normal LV systolic function with visual EF 60-65%. Left ventricle cavity  is normal in size. Normal left ventricular wall thickness. Normal global  wall motion. Unable to evaluate diastolic function due to suboptimal image  acquisition.  Left atrial cavity is mildly dilated at 36.84 ml/m^2.  No  significant valvular heart disease.  Proximal ascending aorta (34mm) measure within normal limits.  Compared to 01/15/2021 moderate LAE is now mild, proximal As Ao dilatation  and moderate PHTN are not appreciated on current  study.   Mobile cardiac telemetry 14 days 01/22/2022 - 02/05/2022: Dominant rhythm: Sinus. HR 37-71 bpm. Avg HR 51 bpm, in sinus rhythm. 28 episodes of atrial tachycardia, fastest at 146 bpm for 11 beats, longest for 11 secs at 113 bpm. <1% isolated SVE, couplet/triplets. 1 episode of VT, at 121 bpm for 6 beats. <1% isolated VE, couplets. No atrial fibrillation/atrial flutter/high grade AV block, sinus pause >3sec noted. 0 patient triggered events.   TTE with bubble study 06/09/2021: 1. Left ventricular ejection fraction, by estimation, is 55 to 60%. The  left ventricle has normal function. The left ventricle has no regional  wall motion abnormalities. There is mild concentric left ventricular  hypertrophy. Left ventricular diastolic  parameters are consistent with Grade II diastolic dysfunction  (pseudonormalization).  2. Right ventricular systolic function is normal. The right ventricular  size is normal. There is normal pulmonary artery systolic pressure. The  estimated right ventricular systolic pressure is 32.6 mmHg.  3. Left atrial size was severely dilated.  4. Right atrial size was mildly dilated.  5. The mitral valve is normal in structure. Trivial mitral valve  regurgitation. No evidence of mitral stenosis.  6. The aortic valve is tricuspid. Aortic valve regurgitation is trivial.  Aortic valve sclerosis is present, with no evidence of aortic valve  stenosis. Aortic valve area, by VTI measures 2.27 cm. Aortic valve mean  gradient measures 5.0 mmHg. Aortic  valve Vmax measures 1.62 m/s.  7. The inferior vena cava is dilated in size with >50% respiratory  variability, suggesting right atrial pressure of 8 mmHg.   Lexiscan Tetrofosmin stress test 01/31/2021: Lexiscan nuclear stress test performed using 1-day protocol. SPECT images show decreased tracer uptake in inferior myocardium, more prominent at rest. In absence of myocardial thickening or wall motion abnormality,  this likely represents attenuation artifact. Stress LVEF 52%. Low risk study.  )        Anesthesia Quick Evaluation

## 2022-09-10 ENCOUNTER — Other Ambulatory Visit: Payer: Self-pay | Admitting: Internal Medicine

## 2022-09-10 ENCOUNTER — Ambulatory Visit (INDEPENDENT_AMBULATORY_CARE_PROVIDER_SITE_OTHER): Payer: PPO | Admitting: Internal Medicine

## 2022-09-10 ENCOUNTER — Encounter: Payer: Self-pay | Admitting: Internal Medicine

## 2022-09-10 VITALS — BP 110/68 | HR 44 | Temp 98.3°F | Ht 72.0 in | Wt 226.0 lb

## 2022-09-10 DIAGNOSIS — M5440 Lumbago with sciatica, unspecified side: Secondary | ICD-10-CM

## 2022-09-10 DIAGNOSIS — G8929 Other chronic pain: Secondary | ICD-10-CM | POA: Diagnosis not present

## 2022-09-10 DIAGNOSIS — F411 Generalized anxiety disorder: Secondary | ICD-10-CM

## 2022-09-10 DIAGNOSIS — I6340 Cerebral infarction due to embolism of unspecified cerebral artery: Secondary | ICD-10-CM

## 2022-09-10 DIAGNOSIS — I10 Essential (primary) hypertension: Secondary | ICD-10-CM | POA: Diagnosis not present

## 2022-09-10 DIAGNOSIS — E119 Type 2 diabetes mellitus without complications: Secondary | ICD-10-CM | POA: Diagnosis not present

## 2022-09-10 DIAGNOSIS — R269 Unspecified abnormalities of gait and mobility: Secondary | ICD-10-CM | POA: Insufficient documentation

## 2022-09-10 DIAGNOSIS — M48061 Spinal stenosis, lumbar region without neurogenic claudication: Secondary | ICD-10-CM | POA: Insufficient documentation

## 2022-09-10 DIAGNOSIS — M5414 Radiculopathy, thoracic region: Secondary | ICD-10-CM | POA: Diagnosis not present

## 2022-09-10 DIAGNOSIS — Z7901 Long term (current) use of anticoagulants: Secondary | ICD-10-CM | POA: Diagnosis not present

## 2022-09-10 DIAGNOSIS — M545 Low back pain, unspecified: Secondary | ICD-10-CM

## 2022-09-10 MED ORDER — MUPIROCIN 2 % EX OINT: TOPICAL_OINTMENT | CUTANEOUS | 0 refills | Status: AC

## 2022-09-10 MED ORDER — AMLODIPINE BESYLATE 5 MG PO TABS: 5.0000 mg | ORAL_TABLET | Freq: Every day | ORAL | 3 refills | Status: DC

## 2022-09-10 MED ORDER — CHLORHEXIDINE GLUCONATE 4 % EX SOLN: Freq: Every day | CUTANEOUS | 0 refills | Status: AC | PRN

## 2022-09-10 MED ORDER — CLOTRIMAZOLE-BETAMETHASONE 1-0.05 % EX CREA: 1.0000 | TOPICAL_CREAM | Freq: Two times a day (BID) | CUTANEOUS | 1 refills | Status: DC

## 2022-09-10 NOTE — Assessment & Plan Note (Addendum)
Foraminectomy/laminectomy pending by Dr Yetta Barre on 09/11/22 (T11-12, T12-L1) - LBP 7/10 Staph+: Hibiclens, Bactroban

## 2022-09-10 NOTE — Assessment & Plan Note (Signed)
On Eliquis

## 2022-09-10 NOTE — Assessment & Plan Note (Signed)
Wellbutrin XL 350 mg/d, Prozac 

## 2022-09-10 NOTE — Assessment & Plan Note (Signed)
On Benicar HCT, Norvasc, Inderal LA 80 mg qpm. On Pravastatin, Eliquis

## 2022-09-10 NOTE — Assessment & Plan Note (Signed)
BP Readings from Last 3 Encounters:  09/10/22 110/68  09/08/22 (!) 135/48  06/09/22 120/60   On Benicar HCT, Norvasc, Inderal LA 80 mg qpm.

## 2022-09-10 NOTE — Progress Notes (Signed)
Spoke with Vernon Bishop, he will arrive tom at 68. NPO post mn.

## 2022-09-10 NOTE — Progress Notes (Signed)
Subjective:  Patient ID: Vernon Bishop, male    DOB: Jul 28, 1940  Age: 82 y.o. MRN: [id number]  CC: Follow-up (3 mnth f/u, weakness, off balance )   HPI Vernon Bishop presents for LBP, HTN, dyslipidemia Foraminectomy/laminectomy pending by Dr Yetta Barre on 09/11/22 (T11-12, T12-L1) - LBP 7/10  Outpatient Medications Prior to Visit  Medication Sig Dispense Refill   acetaminophen (TYLENOL) 325 MG tablet Take 325-650 mg by mouth every 6 (six) hours as needed for mild pain or headache.     Ascorbic Acid (C-500/ROSE HIPS PO) Take by mouth.     Cholecalciferol (VITAMIN D3 PO) Take 1 tablet by mouth daily.     Cyanocobalamin (VITAMIN B-12 CR) 1000 MCG TBCR Take 1,000 mcg by mouth daily.       ELIQUIS 5 MG TABS tablet TAKE 1 TABLET BY MOUTH 2 TIMES DAILY. 60 tablet 3   ezetimibe (ZETIA) 10 MG tablet Take 1 tablet (10 mg total) by mouth daily. 30 tablet 3   FLUoxetine (PROZAC) 40 MG capsule Take 1 capsule (40 mg total) by mouth daily. 90 capsule 2   furosemide (LASIX) 20 MG tablet TAKE 1 TABLET (20 MG TOTAL) BY MOUTH DAILY. 30 tablet 3   gabapentin (NEURONTIN) 300 MG capsule Take 1 capsule (300 mg total) by mouth 3 (three) times daily. 270 capsule 3   Multiple Vitamin (MULTIVITAMIN WITH MINERALS) TABS tablet Take 1 tablet by mouth daily.     olmesartan-hydrochlorothiazide (BENICAR HCT) 20-12.5 MG tablet Take 1 tablet by mouth daily. 90 tablet 3   oxyCODONE-acetaminophen (PERCOCET) 10-325 MG tablet Take 1 tablet by mouth every 6 (six) hours as needed for pain. (Patient taking differently: Take 1 tablet by mouth in the morning, at noon, and at bedtime.) 15 tablet 0   pravastatin (PRAVACHOL) 40 MG tablet Take 1 tablet (40 mg total) by mouth every evening. 90 tablet 3   PRESCRIPTION MEDICATION CPAP- At bedtime     propranolol ER (INDERAL LA) 80 MG 24 hr capsule Take 1 capsule (80 mg total) by mouth daily. 90 capsule 3   Saw Palmetto, Serenoa repens, (SAW PALMETTO PO) Take 1 capsule by mouth daily.      sodium chloride (OCEAN) 0.65 % SOLN nasal spray Place 1 spray into both nostrils as needed for congestion.     tetrahydrozoline 0.05 % ophthalmic solution Place 1 drop into both eyes 2 (two) times daily as needed (for redness).     traZODone (DESYREL) 50 MG tablet Take 1-2 tablets (50-100 mg total) by mouth at bedtime. 60 tablet 5   triamcinolone ointment (KENALOG) 0.1 % Apply 1 Application topically 4 (four) times daily. (Patient taking differently: Apply 1 Application topically daily as needed (irritation).) 160 g 2   zinc gluconate 50 MG tablet Take 50 mg by mouth daily.     amLODipine (NORVASC) 5 MG tablet TAKE 1 TABLET BY MOUTH DAILY. 90 tablet 3   clotrimazole-betamethasone (LOTRISONE) cream Apply 1 application. topically 2 (two) times daily. (Patient taking differently: Apply 1 application  topically daily as needed (to affected areas of the face).) 45 g 1   No facility-administered medications prior to visit.    ROS: Review of Systems  Constitutional:  Positive for fatigue. Negative for appetite change and unexpected weight change.  HENT:  Negative for congestion, nosebleeds, sneezing, sore throat and trouble swallowing.   Eyes:  Negative for itching and visual disturbance.  Respiratory:  Negative for cough.   Cardiovascular:  Negative for chest pain, palpitations and  leg swelling.  Gastrointestinal:  Negative for abdominal distention, blood in stool, diarrhea and nausea.  Genitourinary:  Negative for frequency and hematuria.  Musculoskeletal:  Positive for back pain and gait problem. Negative for joint swelling and neck pain.  Skin:  Negative for rash.  Neurological:  Negative for dizziness, tremors, speech difficulty and weakness.  Hematological:  Bruises/bleeds easily.  Psychiatric/Behavioral:  Positive for decreased concentration, dysphoric mood and sleep disturbance. Negative for agitation and suicidal ideas. The patient is nervous/anxious.     Objective:  BP 110/68 (BP  Location: Left Arm, Patient Position: Sitting, Cuff Size: Large)   Pulse (!) 44   Temp 98.3 F (36.8 C) (Oral)   Ht 6' (1.829 m)   Wt 226 lb (102.5 kg)   SpO2 93%   BMI 30.65 kg/m   BP Readings from Last 3 Encounters:  09/10/22 110/68  09/08/22 (!) 135/48  06/09/22 120/60    Wt Readings from Last 3 Encounters:  09/10/22 226 lb (102.5 kg)  09/08/22 226 lb (102.5 kg)  06/09/22 223 lb (101.2 kg)    Physical Exam Constitutional:      General: He is not in acute distress.    Appearance: He is well-developed. He is obese.     Comments: NAD  Eyes:     Conjunctiva/sclera: Conjunctivae normal.     Pupils: Pupils are equal, round, and reactive to light.  Neck:     Thyroid: No thyromegaly.     Vascular: No JVD.  Cardiovascular:     Rate and Rhythm: Normal rate and regular rhythm.     Heart sounds: Normal heart sounds. No murmur heard.    No friction rub. No gallop.  Pulmonary:     Effort: Pulmonary effort is normal. No respiratory distress.     Breath sounds: Normal breath sounds. No wheezing or rales.  Chest:     Chest wall: No tenderness.  Abdominal:     General: Bowel sounds are normal. There is no distension.     Palpations: Abdomen is soft. There is no mass.     Tenderness: There is no abdominal tenderness. There is no guarding or rebound.  Musculoskeletal:        General: No tenderness. Normal range of motion.     Cervical back: Normal range of motion.     Right lower leg: No edema.     Left lower leg: No edema.  Lymphadenopathy:     Cervical: No cervical adenopathy.  Skin:    General: Skin is warm and dry.     Findings: No rash.  Neurological:     Mental Status: He is alert and oriented to person, place, and time.     Cranial Nerves: No cranial nerve deficit.     Motor: Weakness present. No abnormal muscle tone.     Coordination: Coordination abnormal.     Gait: Gait abnormal.     Deep Tendon Reflexes: Reflexes are normal and symmetric.  Psychiatric:         Behavior: Behavior normal.        Thought Content: Thought content normal.        Judgment: Judgment normal.   Antalgic gait Using a cane  Lab Results  Component Value Date   WBC 5.5 09/08/2022   HGB 12.4 (L) 09/08/2022   HCT 36.9 (L) 09/08/2022   PLT 171 09/08/2022   GLUCOSE 89 09/08/2022   CHOL 119 05/20/2022   TRIG 102 05/20/2022   HDL 35 (L) 05/20/2022  LDLCALC 65 05/20/2022   ALT 18 04/22/2022   AST 21 04/22/2022   NA 138 09/08/2022   K 3.8 09/08/2022   CL 104 09/08/2022   CREATININE 0.94 09/08/2022   BUN 19 09/08/2022   CO2 23 09/08/2022   TSH 2.340 02/02/2022   PSA 0.73 12/20/2019   INR 1.2 04/22/2022   HGBA1C 5.0 06/09/2021   MICROALBUR 3.9 12/20/2019    No results found.  Assessment & Plan:   Problem List Items Addressed This Visit     Diabetes mellitus type 2, diet-controlled (HCC) - Primary   Relevant Orders   Urine microalbumin-creatinine with uACR   Hemoglobin A1c   LOW BACK PAIN    Foraminectomy/laminectomy pending by Dr Yetta Barre on 09/11/22 (T11-12, T12-L1) - LBP 7/10      Generalized anxiety disorder    Wellbutrin XL 350 mg/d, Prozac      Chronic back pain    Foraminectomy/laminectomy pending by Dr Yetta Barre on 09/11/22 (T11-12, T12-L1) - LBP 7/10 Staph+: Hibiclens, Bactroban      Cerebrovascular accident (CVA) (HCC)    On Benicar HCT, Norvasc, Inderal LA 80 mg qpm. On Pravastatin, Eliquis      Relevant Medications   amLODipine (NORVASC) 5 MG tablet   HTN (hypertension)    BP Readings from Last 3 Encounters:  09/10/22 110/68  09/08/22 (!) 135/48  06/09/22 120/60  On Benicar HCT, Norvasc, Inderal LA 80 mg qpm.      Relevant Medications   amLODipine (NORVASC) 5 MG tablet   Anticoagulant long-term use    On  Eliquis      Thoracic radiculopathy    Foraminectomy/laminectomy pending by Dr Yetta Barre on 09/11/22 (T11-12, T12-L1) - LBP 7/10         Meds ordered this encounter  Medications   amLODipine (NORVASC) 5 MG tablet    Sig: Take  1 tablet (5 mg total) by mouth daily.    Dispense:  90 tablet    Refill:  3   clotrimazole-betamethasone (LOTRISONE) cream    Sig: Apply 1 Application topically 2 (two) times daily.    Dispense:  45 g    Refill:  1   mupirocin ointment (BACTROBAN) 2 %    Sig: On leg wound w/dressing change qd or bid    Dispense:  30 g    Refill:  0   chlorhexidine (HIBICLENS) 4 % external liquid    Sig: Apply topically daily as needed. Use 1-2 times in the shower    Dispense:  500 mL    Refill:  0      Follow-up: No follow-ups on file.  Sonda Primes, MD

## 2022-09-10 NOTE — Assessment & Plan Note (Signed)
Foraminectomy/laminectomy pending by Dr Yetta Barre on 09/11/22 (T11-12, T12-L1) - LBP 7/10

## 2022-09-11 ENCOUNTER — Ambulatory Visit (HOSPITAL_BASED_OUTPATIENT_CLINIC_OR_DEPARTMENT_OTHER): Payer: PPO | Admitting: Physician Assistant

## 2022-09-11 ENCOUNTER — Observation Stay (HOSPITAL_COMMUNITY)
Admission: RE | Admit: 2022-09-11 | Discharge: 2022-09-12 | Disposition: A | Discharge status: Home / Self Care | Payer: PPO | Attending: Neurological Surgery | Admitting: Neurological Surgery

## 2022-09-11 ENCOUNTER — Ambulatory Visit (HOSPITAL_COMMUNITY)
Admission: RE | Disposition: A | Discharge status: Home / Self Care | Payer: Self-pay | Source: Home / Self Care | Attending: Neurological Surgery

## 2022-09-11 ENCOUNTER — Ambulatory Visit (HOSPITAL_COMMUNITY): Payer: PPO

## 2022-09-11 ENCOUNTER — Encounter (HOSPITAL_COMMUNITY): Payer: Self-pay | Admitting: Neurological Surgery

## 2022-09-11 ENCOUNTER — Ambulatory Visit (HOSPITAL_COMMUNITY): Payer: PPO | Admitting: Physician Assistant

## 2022-09-11 ENCOUNTER — Other Ambulatory Visit: Payer: Self-pay

## 2022-09-11 DIAGNOSIS — Z7901 Long term (current) use of anticoagulants: Secondary | ICD-10-CM | POA: Insufficient documentation

## 2022-09-11 DIAGNOSIS — Z981 Arthrodesis status: Secondary | ICD-10-CM | POA: Diagnosis not present

## 2022-09-11 DIAGNOSIS — Z87891 Personal history of nicotine dependence: Secondary | ICD-10-CM | POA: Insufficient documentation

## 2022-09-11 DIAGNOSIS — Z9889 Other specified postprocedural states: Secondary | ICD-10-CM

## 2022-09-11 DIAGNOSIS — M4805 Spinal stenosis, thoracolumbar region: Secondary | ICD-10-CM

## 2022-09-11 DIAGNOSIS — R262 Difficulty in walking, not elsewhere classified: Secondary | ICD-10-CM | POA: Insufficient documentation

## 2022-09-11 DIAGNOSIS — I10 Essential (primary) hypertension: Secondary | ICD-10-CM | POA: Diagnosis not present

## 2022-09-11 DIAGNOSIS — E119 Type 2 diabetes mellitus without complications: Secondary | ICD-10-CM | POA: Diagnosis not present

## 2022-09-11 DIAGNOSIS — I4891 Unspecified atrial fibrillation: Secondary | ICD-10-CM | POA: Insufficient documentation

## 2022-09-11 DIAGNOSIS — Z96643 Presence of artificial hip joint, bilateral: Secondary | ICD-10-CM | POA: Insufficient documentation

## 2022-09-11 DIAGNOSIS — M4804 Spinal stenosis, thoracic region: Secondary | ICD-10-CM | POA: Diagnosis not present

## 2022-09-11 DIAGNOSIS — M48061 Spinal stenosis, lumbar region without neurogenic claudication: Secondary | ICD-10-CM | POA: Diagnosis not present

## 2022-09-11 DIAGNOSIS — G9520 Unspecified cord compression: Secondary | ICD-10-CM | POA: Diagnosis not present

## 2022-09-11 DIAGNOSIS — Z79899 Other long term (current) drug therapy: Secondary | ICD-10-CM | POA: Diagnosis not present

## 2022-09-11 HISTORY — PX: LUMBAR LAMINECTOMY/DECOMPRESSION MICRODISCECTOMY: SHX5026

## 2022-09-11 SURGERY — LUMBAR LAMINECTOMY/DECOMPRESSION MICRODISCECTOMY 2 LEVELS
Anesthesia: General | Site: Back

## 2022-09-11 MED ORDER — DEXAMETHASONE SODIUM PHOSPHATE 4 MG/ML IJ SOLN: 4.0000 mg | Freq: Four times a day (QID) | INTRAMUSCULAR | Status: DC

## 2022-09-11 MED ORDER — FUROSEMIDE 20 MG PO TABS
20.0000 mg | ORAL_TABLET | Freq: Every day | ORAL | Status: DC
  Administered 2022-09-11: 20 mg via ORAL
  Filled 2022-09-11: qty 1

## 2022-09-11 MED ORDER — SODIUM CHLORIDE 0.9% FLUSH
3.0000 mL | Freq: Two times a day (BID) | INTRAVENOUS | Status: DC
  Administered 2022-09-11: 3 mL via INTRAVENOUS

## 2022-09-11 MED ORDER — EPHEDRINE 5 MG/ML INJ
INTRAVENOUS | Status: AC
  Filled 2022-09-11: qty 5

## 2022-09-11 MED ORDER — FENTANYL CITRATE (PF) 100 MCG/2ML IJ SOLN
25.0000 ug | INTRAMUSCULAR | Status: DC | PRN
  Administered 2022-09-11 (×2): 50 ug via INTRAVENOUS

## 2022-09-11 MED ORDER — OXYCODONE-ACETAMINOPHEN 5-325 MG PO TABS
1.0000 | ORAL_TABLET | ORAL | Status: DC | PRN
  Administered 2022-09-11 – 2022-09-12 (×4): 1 via ORAL
  Filled 2022-09-11 (×4): qty 1

## 2022-09-11 MED ORDER — SODIUM CHLORIDE 0.9 % IV SOLN: 250.0000 mL | INTRAVENOUS | Status: DC

## 2022-09-11 MED ORDER — BUPIVACAINE HCL (PF) 0.25 % IJ SOLN
INTRAMUSCULAR | Status: DC | PRN
  Administered 2022-09-11: 10 mL

## 2022-09-11 MED ORDER — CEFAZOLIN SODIUM-DEXTROSE 2-4 GM/100ML-% IV SOLN
INTRAVENOUS | Status: AC
  Filled 2022-09-11: qty 100

## 2022-09-11 MED ORDER — AMLODIPINE BESYLATE 5 MG PO TABS: 5.0000 mg | ORAL_TABLET | Freq: Every day | ORAL | Status: DC

## 2022-09-11 MED ORDER — ROCURONIUM BROMIDE 10 MG/ML (PF) SYRINGE
PREFILLED_SYRINGE | INTRAVENOUS | Status: AC
  Filled 2022-09-11: qty 10

## 2022-09-11 MED ORDER — POTASSIUM CHLORIDE IN NACL 20-0.9 MEQ/L-% IV SOLN: INTRAVENOUS | Status: DC

## 2022-09-11 MED ORDER — PHENOL 1.4 % MT LIQD: 1.0000 | OROMUCOSAL | Status: DC | PRN

## 2022-09-11 MED ORDER — PROPOFOL 10 MG/ML IV BOLUS
INTRAVENOUS | Status: DC | PRN
  Administered 2022-09-11: 160 mg via INTRAVENOUS

## 2022-09-11 MED ORDER — THROMBIN (RECOMBINANT) 5000 UNITS EX SOLR
CUTANEOUS | Status: DC | PRN
  Administered 2022-09-11: 10 mL via TOPICAL

## 2022-09-11 MED ORDER — THROMBIN 5000 UNITS EX SOLR
CUTANEOUS | Status: AC
  Filled 2022-09-11: qty 15000

## 2022-09-11 MED ORDER — DEXAMETHASONE SODIUM PHOSPHATE 10 MG/ML IJ SOLN
INTRAMUSCULAR | Status: AC
  Filled 2022-09-11: qty 1

## 2022-09-11 MED ORDER — METHOCARBAMOL 1000 MG/10ML IJ SOLN: 500.0000 mg | Freq: Four times a day (QID) | INTRAVENOUS | Status: DC | PRN

## 2022-09-11 MED ORDER — GABAPENTIN 300 MG PO CAPS
ORAL_CAPSULE | ORAL | Status: AC
  Filled 2022-09-11: qty 1

## 2022-09-11 MED ORDER — CHLORHEXIDINE GLUCONATE CLOTH 2 % EX PADS: 6.0000 | MEDICATED_PAD | Freq: Once | CUTANEOUS | Status: DC

## 2022-09-11 MED ORDER — ORAL CARE MOUTH RINSE: 15.0000 mL | Freq: Once | OROMUCOSAL | Status: AC

## 2022-09-11 MED ORDER — OLMESARTAN MEDOXOMIL-HCTZ 20-12.5 MG PO TABS: 1.0000 | ORAL_TABLET | Freq: Every day | ORAL | Status: DC

## 2022-09-11 MED ORDER — ONDANSETRON HCL 4 MG/2ML IJ SOLN
INTRAMUSCULAR | Status: AC
  Filled 2022-09-11: qty 2

## 2022-09-11 MED ORDER — TRAZODONE HCL 50 MG PO TABS
50.0000 mg | ORAL_TABLET | Freq: Every day | ORAL | Status: DC
  Administered 2022-09-11: 100 mg via ORAL
  Filled 2022-09-11: qty 2

## 2022-09-11 MED ORDER — THROMBIN 5000 UNITS EX SOLR
OROMUCOSAL | Status: DC | PRN
  Administered 2022-09-11: 5 mL via TOPICAL

## 2022-09-11 MED ORDER — GLYCOPYRROLATE PF 0.2 MG/ML IJ SOSY
PREFILLED_SYRINGE | INTRAMUSCULAR | Status: DC | PRN
  Administered 2022-09-11 (×2): .1 mg via INTRAVENOUS

## 2022-09-11 MED ORDER — PROPOFOL 10 MG/ML IV BOLUS
INTRAVENOUS | Status: AC
  Filled 2022-09-11: qty 20

## 2022-09-11 MED ORDER — CELECOXIB 200 MG PO CAPS
200.0000 mg | ORAL_CAPSULE | Freq: Two times a day (BID) | ORAL | Status: DC
  Administered 2022-09-11: 200 mg via ORAL
  Filled 2022-09-11: qty 1

## 2022-09-11 MED ORDER — AMISULPRIDE (ANTIEMETIC) 5 MG/2ML IV SOLN: 10.0000 mg | Freq: Once | INTRAVENOUS | Status: DC | PRN

## 2022-09-11 MED ORDER — 0.9 % SODIUM CHLORIDE (POUR BTL) OPTIME
TOPICAL | Status: DC | PRN
  Administered 2022-09-11: 1000 mL

## 2022-09-11 MED ORDER — ACETAMINOPHEN 325 MG PO TABS
ORAL_TABLET | ORAL | Status: AC
  Filled 2022-09-11: qty 2

## 2022-09-11 MED ORDER — PHENYLEPHRINE 80 MCG/ML (10ML) SYRINGE FOR IV PUSH (FOR BLOOD PRESSURE SUPPORT)
PREFILLED_SYRINGE | INTRAVENOUS | Status: AC
  Filled 2022-09-11: qty 10

## 2022-09-11 MED ORDER — CEFAZOLIN SODIUM-DEXTROSE 2-4 GM/100ML-% IV SOLN
2.0000 g | INTRAVENOUS | Status: AC
  Administered 2022-09-11: 2 g via INTRAVENOUS

## 2022-09-11 MED ORDER — ROCURONIUM BROMIDE 10 MG/ML (PF) SYRINGE
PREFILLED_SYRINGE | INTRAVENOUS | Status: DC | PRN
  Administered 2022-09-11: 10 mg via INTRAVENOUS
  Administered 2022-09-11: 60 mg via INTRAVENOUS
  Administered 2022-09-11: 10 mg via INTRAVENOUS

## 2022-09-11 MED ORDER — ONDANSETRON HCL 4 MG/2ML IJ SOLN
INTRAMUSCULAR | Status: DC | PRN
  Administered 2022-09-11: 4 mg via INTRAVENOUS

## 2022-09-11 MED ORDER — FENTANYL CITRATE (PF) 250 MCG/5ML IJ SOLN
INTRAMUSCULAR | Status: DC | PRN
  Administered 2022-09-11: 100 ug via INTRAVENOUS
  Administered 2022-09-11: 50 ug via INTRAVENOUS

## 2022-09-11 MED ORDER — CHLORHEXIDINE GLUCONATE 0.12 % MT SOLN
OROMUCOSAL | Status: AC
  Administered 2022-09-11: 15 mL via OROMUCOSAL
  Filled 2022-09-11: qty 15

## 2022-09-11 MED ORDER — SENNA 8.6 MG PO TABS
1.0000 | ORAL_TABLET | Freq: Two times a day (BID) | ORAL | Status: DC
  Administered 2022-09-11: 8.6 mg via ORAL
  Filled 2022-09-11: qty 1

## 2022-09-11 MED ORDER — FENTANYL CITRATE (PF) 250 MCG/5ML IJ SOLN
INTRAMUSCULAR | Status: AC
  Filled 2022-09-11: qty 5

## 2022-09-11 MED ORDER — SURGIRINSE WOUND IRRIGATION SYSTEM - OPTIME: TOPICAL | Status: DC | PRN

## 2022-09-11 MED ORDER — ACETAMINOPHEN 325 MG PO TABS: 650.0000 mg | ORAL_TABLET | ORAL | Status: DC | PRN

## 2022-09-11 MED ORDER — LIDOCAINE 2% (20 MG/ML) 5 ML SYRINGE
INTRAMUSCULAR | Status: AC
  Filled 2022-09-11: qty 5

## 2022-09-11 MED ORDER — SUGAMMADEX SODIUM 200 MG/2ML IV SOLN
INTRAVENOUS | Status: DC | PRN
  Administered 2022-09-11 (×2): 100 mg via INTRAVENOUS

## 2022-09-11 MED ORDER — MENTHOL 3 MG MT LOZG: 1.0000 | LOZENGE | OROMUCOSAL | Status: DC | PRN

## 2022-09-11 MED ORDER — HYDROCHLOROTHIAZIDE 12.5 MG PO TABS: 12.5000 mg | ORAL_TABLET | Freq: Every day | ORAL | Status: DC

## 2022-09-11 MED ORDER — CHLORHEXIDINE GLUCONATE 0.12 % MT SOLN: 15.0000 mL | Freq: Once | OROMUCOSAL | Status: AC

## 2022-09-11 MED ORDER — OXYCODONE HCL 5 MG PO TABS
5.0000 mg | ORAL_TABLET | ORAL | Status: DC | PRN
  Administered 2022-09-11 – 2022-09-12 (×3): 5 mg via ORAL
  Filled 2022-09-11 (×3): qty 1

## 2022-09-11 MED ORDER — LIDOCAINE 2% (20 MG/ML) 5 ML SYRINGE
INTRAMUSCULAR | Status: DC | PRN
  Administered 2022-09-11: 60 mg via INTRAVENOUS

## 2022-09-11 MED ORDER — FLUOXETINE HCL 20 MG PO CAPS: 40.0000 mg | ORAL_CAPSULE | Freq: Every day | ORAL | Status: DC

## 2022-09-11 MED ORDER — SODIUM CHLORIDE 0.9% FLUSH: 3.0000 mL | INTRAVENOUS | Status: DC | PRN

## 2022-09-11 MED ORDER — ZINC GLUCONATE 50 MG PO TABS: 50.0000 mg | ORAL_TABLET | Freq: Every day | ORAL | Status: DC

## 2022-09-11 MED ORDER — BUPIVACAINE HCL (PF) 0.25 % IJ SOLN
INTRAMUSCULAR | Status: AC
  Filled 2022-09-11: qty 30

## 2022-09-11 MED ORDER — IRBESARTAN 150 MG PO TABS: 150.0000 mg | ORAL_TABLET | Freq: Every day | ORAL | Status: DC

## 2022-09-11 MED ORDER — CEFAZOLIN SODIUM-DEXTROSE 2-4 GM/100ML-% IV SOLN
2.0000 g | Freq: Three times a day (TID) | INTRAVENOUS | Status: AC
  Administered 2022-09-11 – 2022-09-12 (×2): 2 g via INTRAVENOUS
  Filled 2022-09-11 (×2): qty 100

## 2022-09-11 MED ORDER — ZINC SULFATE 220 (50 ZN) MG PO CAPS
220.0000 mg | ORAL_CAPSULE | Freq: Every day | ORAL | Status: DC
  Administered 2022-09-11: 220 mg via ORAL
  Filled 2022-09-11 (×2): qty 1

## 2022-09-11 MED ORDER — ADULT MULTIVITAMIN W/MINERALS CH
1.0000 | ORAL_TABLET | Freq: Every day | ORAL | Status: DC
  Administered 2022-09-11: 1 via ORAL
  Filled 2022-09-11 (×2): qty 1

## 2022-09-11 MED ORDER — OXYCODONE-ACETAMINOPHEN 10-325 MG PO TABS: 1.0000 | ORAL_TABLET | ORAL | Status: DC | PRN

## 2022-09-11 MED ORDER — DEXAMETHASONE 4 MG PO TABS
4.0000 mg | ORAL_TABLET | Freq: Four times a day (QID) | ORAL | Status: DC
  Administered 2022-09-11 – 2022-09-12 (×3): 4 mg via ORAL
  Filled 2022-09-11 (×3): qty 1

## 2022-09-11 MED ORDER — ACETAMINOPHEN 500 MG PO TABS: 1000.0000 mg | ORAL_TABLET | ORAL | Status: DC

## 2022-09-11 MED ORDER — GABAPENTIN 300 MG PO CAPS: 300.0000 mg | ORAL_CAPSULE | ORAL | Status: DC

## 2022-09-11 MED ORDER — EZETIMIBE 10 MG PO TABS
10.0000 mg | ORAL_TABLET | Freq: Every day | ORAL | Status: DC
  Administered 2022-09-11: 10 mg via ORAL
  Filled 2022-09-11: qty 1

## 2022-09-11 MED ORDER — ONDANSETRON HCL 4 MG PO TABS: 4.0000 mg | ORAL_TABLET | Freq: Four times a day (QID) | ORAL | Status: DC | PRN

## 2022-09-11 MED ORDER — DEXAMETHASONE SODIUM PHOSPHATE 10 MG/ML IJ SOLN
INTRAMUSCULAR | Status: DC | PRN
  Administered 2022-09-11: 5 mg via INTRAVENOUS

## 2022-09-11 MED ORDER — MORPHINE SULFATE (PF) 2 MG/ML IV SOLN
2.0000 mg | INTRAVENOUS | Status: DC | PRN
  Administered 2022-09-11 (×2): 2 mg via INTRAVENOUS
  Filled 2022-09-11 (×2): qty 1

## 2022-09-11 MED ORDER — ACETAMINOPHEN 325 MG PO TABS: 650.0000 mg | ORAL_TABLET | Freq: Once | ORAL | Status: AC

## 2022-09-11 MED ORDER — PROPRANOLOL HCL ER 80 MG PO CP24
80.0000 mg | ORAL_CAPSULE | Freq: Every day | ORAL | Status: DC
  Filled 2022-09-11: qty 1

## 2022-09-11 MED ORDER — GABAPENTIN 300 MG PO CAPS
300.0000 mg | ORAL_CAPSULE | Freq: Three times a day (TID) | ORAL | Status: DC
  Administered 2022-09-11 (×2): 300 mg via ORAL
  Filled 2022-09-11 (×2): qty 1

## 2022-09-11 MED ORDER — ACETAMINOPHEN 500 MG PO TABS
ORAL_TABLET | ORAL | Status: AC
  Administered 2022-09-11: 650 mg via ORAL
  Filled 2022-09-11: qty 2

## 2022-09-11 MED ORDER — ONDANSETRON HCL 4 MG/2ML IJ SOLN: 4.0000 mg | Freq: Four times a day (QID) | INTRAMUSCULAR | Status: DC | PRN

## 2022-09-11 MED ORDER — METHOCARBAMOL 500 MG PO TABS
500.0000 mg | ORAL_TABLET | Freq: Four times a day (QID) | ORAL | Status: DC | PRN
  Administered 2022-09-11 – 2022-09-12 (×3): 500 mg via ORAL
  Filled 2022-09-11 (×3): qty 1

## 2022-09-11 MED ORDER — EPHEDRINE SULFATE-NACL 50-0.9 MG/10ML-% IV SOSY
PREFILLED_SYRINGE | INTRAVENOUS | Status: DC | PRN
  Administered 2022-09-11 (×2): 5 mg via INTRAVENOUS
  Administered 2022-09-11: 10 mg via INTRAVENOUS
  Administered 2022-09-11 (×2): 5 mg via INTRAVENOUS

## 2022-09-11 MED ORDER — FENTANYL CITRATE (PF) 100 MCG/2ML IJ SOLN
INTRAMUSCULAR | Status: AC
  Filled 2022-09-11: qty 2

## 2022-09-11 MED ORDER — ALBUMIN HUMAN 5 % IV SOLN: INTRAVENOUS | Status: DC | PRN

## 2022-09-11 MED ORDER — ACETAMINOPHEN 650 MG RE SUPP: 650.0000 mg | RECTAL | Status: DC | PRN

## 2022-09-11 MED ORDER — LACTATED RINGERS IV SOLN: INTRAVENOUS | Status: DC

## 2022-09-11 SURGICAL SUPPLY — 46 items
APL SKNCLS STERI-STRIP NONHPOA (GAUZE/BANDAGES/DRESSINGS) ×1
BAG COUNTER SPONGE SURGICOUNT (BAG) ×2 IMPLANT
BAG SPNG CNTER NS LX DISP (BAG) ×2
BENZOIN TINCTURE PRP APPL 2/3 (GAUZE/BANDAGES/DRESSINGS) ×2 IMPLANT
BIT DRILL NEURO 2X3.1 SFT TUCH (MISCELLANEOUS) IMPLANT
BUR CARBIDE MATCH 3.0 (BURR) ×2 IMPLANT
CANISTER SUCT 3000ML PPV (MISCELLANEOUS) ×2 IMPLANT
DRAPE LAPAROTOMY 100X72X124 (DRAPES) ×2 IMPLANT
DRAPE MICROSCOPE SLANT 54X150 (MISCELLANEOUS) ×2 IMPLANT
DRAPE SURG 17X23 STRL (DRAPES) ×2 IMPLANT
DRILL NEURO 2X3.1 SOFT TOUCH (MISCELLANEOUS) ×1
DRSG OPSITE POSTOP 4X6 (GAUZE/BANDAGES/DRESSINGS) IMPLANT
DURAPREP 26ML APPLICATOR (WOUND CARE) ×2 IMPLANT
ELECT REM PT RETURN 9FT ADLT (ELECTROSURGICAL) ×1
ELECTRODE REM PT RTRN 9FT ADLT (ELECTROSURGICAL) ×2 IMPLANT
GAUZE 4X4 16PLY ~~LOC~~+RFID DBL (SPONGE) IMPLANT
GLOVE BIO SURGEON STRL SZ7 (GLOVE) IMPLANT
GLOVE BIO SURGEON STRL SZ8 (GLOVE) ×2 IMPLANT
GLOVE BIOGEL PI IND STRL 7.0 (GLOVE) IMPLANT
GOWN STRL REUS W/ TWL LRG LVL3 (GOWN DISPOSABLE) IMPLANT
GOWN STRL REUS W/ TWL XL LVL3 (GOWN DISPOSABLE) ×2 IMPLANT
GOWN STRL REUS W/TWL 2XL LVL3 (GOWN DISPOSABLE) IMPLANT
GOWN STRL REUS W/TWL LRG LVL3 (GOWN DISPOSABLE) ×3
GOWN STRL REUS W/TWL XL LVL3 (GOWN DISPOSABLE) ×2
HEMOSTAT POWDER KIT SURGIFOAM (HEMOSTASIS) ×2 IMPLANT
KIT BASIN OR (CUSTOM PROCEDURE TRAY) ×2 IMPLANT
KIT TURNOVER KIT B (KITS) ×2 IMPLANT
NDL HYPO 25X1 1.5 SAFETY (NEEDLE) ×2 IMPLANT
NDL SPNL 20GX3.5 QUINCKE YW (NEEDLE) IMPLANT
NEEDLE HYPO 25X1 1.5 SAFETY (NEEDLE) ×1 IMPLANT
NEEDLE SPNL 20GX3.5 QUINCKE YW (NEEDLE) ×1 IMPLANT
NS IRRIG 1000ML POUR BTL (IV SOLUTION) ×2 IMPLANT
PACK LAMINECTOMY NEURO (CUSTOM PROCEDURE TRAY) ×2 IMPLANT
PAD ARMBOARD 7.5X6 YLW CONV (MISCELLANEOUS) ×6 IMPLANT
SOL ELECTROSURG ANTI STICK (MISCELLANEOUS)
SOLUTION ELECTROSURG ANTI STCK (MISCELLANEOUS) ×2 IMPLANT
SPONGE SURGIFOAM ABS GEL SZ50 (HEMOSTASIS) ×2 IMPLANT
STRIP CLOSURE SKIN 1/2X4 (GAUZE/BANDAGES/DRESSINGS) ×2 IMPLANT
SUT VIC AB 0 CT1 18XCR BRD8 (SUTURE) ×2 IMPLANT
SUT VIC AB 0 CT1 8-18 (SUTURE) ×1
SUT VIC AB 2-0 CP2 18 (SUTURE) ×2 IMPLANT
SUT VIC AB 3-0 SH 8-18 (SUTURE) ×2 IMPLANT
TIP KERRISON THIN FOOTPLATE 2M (MISCELLANEOUS) IMPLANT
TOWEL GREEN STERILE (TOWEL DISPOSABLE) ×2 IMPLANT
TOWEL GREEN STERILE FF (TOWEL DISPOSABLE) ×2 IMPLANT
WATER STERILE IRR 1000ML POUR (IV SOLUTION) ×2 IMPLANT

## 2022-09-11 NOTE — Transfer of Care (Signed)
Immediate Anesthesia Transfer of Care Note  Patient: Vernon Bishop  Procedure(s) Performed: Laminectomy and Foraminotomy - Thoracic eleven -Twelve- Lumbar one-Lumbar two (Back)  Patient Location: PACU  Anesthesia Type:General  Level of Consciousness: awake and alert   Airway & Oxygen Therapy: Patient Spontanous Breathing and Patient connected to face mask oxygen  Post-op Assessment: Report given to RN and Post -op Vital signs reviewed and stable  Post vital signs: Reviewed and stable  Last Vitals:  Vitals Value Taken Time  BP 156/70 09/11/22 1300  Temp    Pulse 53 09/11/22 1301  Resp 13 09/11/22 1301  SpO2 100 % 09/11/22 1301  Vitals shown include unvalidated device data.  Last Pain:  Vitals:   09/11/22 0820  TempSrc:   PainSc: 6          Complications: No notable events documented.

## 2022-09-11 NOTE — Op Note (Signed)
09/11/2022  12:40 PM  PATIENT:  Vernon Bishop  82 y.o. male  PRE-OPERATIVE DIAGNOSIS:  1.  Thoracic spinal stenosis T11-12 with cord compression and gait instability from leg weakness, 2.  Adjacent level stenosis L1-2  POST-OPERATIVE DIAGNOSIS:  same  PROCEDURE:  1.  Decompressive thoracic laminectomy, medial facetectomy and foraminotomies T11-12  2.  Decompressive lumbar laminectomy, medial facetectomy and foraminotomies L1-2  SURGEON:  Marikay Alar, MD  ASSISTANTS: Verlin Dike, FNP  ANESTHESIA:   General  EBL: 100 ml  Total I/O In: 1050 [I.V.:800; IV Piggyback:250] Out: 200 [Blood:200]  BLOOD ADMINISTERED: none  DRAINS: Hemovac  SPECIMEN:  none  INDICATION FOR PROCEDURE: This patient presented with leg weakness and gait disturbance. Imaging showed significant spinal stenosis at L1-2 above her previous fusion surgery, and thoracic spinal stenosis at T11-12. The patient tried conservative measures without relief. Pain was debilitating. Recommended decompressive laminectomy at L1-2 and T11-12. Patient understood the risks, benefits, and alternatives and potential outcomes and wished to proceed.  PROCEDURE DETAILS: The patient was taken to the operating room and after induction of adequate generalized endotracheal anesthesia, the patient was rolled into the prone position on the Wilson frame and all pressure points were padded. The thoracic and lumbar region was cleaned and then prepped with DuraPrep and draped in the usual sterile fashion. 8 cc of local anesthesia was injected and then a dorsal midline incision was made and carried down to the lumbo sacral fascia. The fascia was opened and the paraspinous musculature was taken down in a subperiosteal fashion to expose T11-12 to L1-2. Intraoperative x-ray confirmed my level, and then I removed the spinous process of T11 and L1 , leaving the T12 spinous process in place, and used a combination of the high-speed drill and the Kerrison  punches to perform a laminectomy, medial facetectomy, and foraminotomy at L1-2 and T11-12 bilaterally. The underlying yellow ligament was opened and removed in a piecemeal fashion to expose the underlying dura and exiting nerve root. I undercut the lateral recess and dissected down until I was medial to and distal to the pedicle at both levels.  At T11-12 on the right the inferior reticular process was adherent to the dura and we could not dissected away from the dura without spinal fluid leak or causing dural compression.  I drilled this down to an eggshell and even drilled the lateral recess until it was released from the facet itself.  This allowed the dura to relax somewhat.  We teased away at it with micro dissectors, but finally felt like it was safest to leave a thin layer of bone attached to the dura.  It appeared to be causing only mild compression at that point.  The left side of the canal was completely decompressed allowing the cord to float that way and therefore felt it was safest to leave this alone other than risk of CSF leak or neurologic injury.  We tried to be as gentle as we possibly could with the decompression.  I irrigated with 0.5% povidone iodine solution followed by saline solution. Achieved hemostasis with bipolar cautery, lined the dura with Gelfoam, laced a medium Hemovac drain through a separate stab incision, and then closed the fascia with 0 Vicryl. I closed the subcutaneous tissues with 2-0 Vicryl and the subcuticular tissues with 3-0 Vicryl. The skin was then closed with benzoin and Steri-Strips. The drapes were removed, a sterile dressing was applied.  My nurse practitioner was involved in the exposure, safe decompression of the  neural elements, and the closure. the patient was awakened from general anesthesia and transferred to the recovery room in stable condition. At the end of the procedure all sponge, needle and instrument counts were correct.    PLAN OF CARE: Admit for  overnight observation  PATIENT DISPOSITION:  PACU - hemodynamically stable.   Delay start of Pharmacological VTE agent (>24hrs) due to surgical blood loss or risk of bleeding:  yes

## 2022-09-11 NOTE — Anesthesia Postprocedure Evaluation (Signed)
Anesthesia Post Note  Patient: Vernon Bishop  Procedure(s) Performed: Laminectomy and Foraminotomy - Thoracic eleven -Twelve- Lumbar one-Lumbar two (Back)     Patient location during evaluation: PACU Anesthesia Type: General Level of consciousness: awake and alert Pain management: pain level controlled Vital Signs Assessment: post-procedure vital signs reviewed and stable Respiratory status: spontaneous breathing, nonlabored ventilation, respiratory function stable and patient connected to nasal cannula oxygen Cardiovascular status: blood pressure returned to baseline and stable Postop Assessment: no apparent nausea or vomiting Anesthetic complications: no  No notable events documented.  Last Vitals:  Vitals:   09/11/22 1345 09/11/22 1412  BP: (!) 157/55 (!) 181/58  Pulse: (!) 51 (!) 48  Resp: 14 16  Temp:  36.6 C  SpO2: 95% 94%    Last Pain:  Vitals:   09/11/22 1412  TempSrc: Axillary  PainSc:                  Kennieth Rad

## 2022-09-11 NOTE — H&P (Signed)
Subjective: Patient is a 82 y.o. male admitted for gait instability and leg weakness. Onset of symptoms was several months ago, gradually worsening since that time.  The pain is rated moderate, and is located at the across the lower back and radiates to legs. The pain is described as aching and occurs intermittently. The symptoms have been progressive. Symptoms are exacerbated by exercise, standing, and walking for more than a few minutes. MRI or CT showed stenosis L1-2 and T11-12   Past Medical History:  Diagnosis Date   Arthralgia    Atrial fibrillation (HCC)    1992   Benign prostatic hyperplasia    Bradycardia    "my doctor says I have bradycardia, my heart rate is always in the 40s"   DDD (degenerative disc disease)    Depression    Dysrhythmia    aFIB   Dyssomnia    ED (erectile dysfunction)    Hyperlipidemia    Hypertension    Hypogonadism male    Joint pain    LBP (low back pain)    Left pontine stroke (HCC)    numbness on tongue and occasional slurred speech   Multiple actinic keratoses    Muscle weakness    Upper limb   MVP (mitral valve prolapse)    Neck pain    OSA on CPAP    cpap   Osteoarthritis of hip    Osteoarthritis, hand    SEVERE LOWER BACK PAIN, AND RESTLESS LEG SYNDROME   Otalgia of right ear    Pain in left knee    Pain in thoracic spine    Palpitations    PTSD (post-traumatic stress disorder)    Shoulder pain    Subjective visual disturbance    Swallowing problem    HX OF PILLS GETTING "STUCK" IN THROAT AT TIMES   Swelling    Vitamin D deficiency     Past Surgical History:  Procedure Laterality Date   ABDOMINAL EXPOSURE N/A 03/12/2021   Procedure: ABDOMINAL EXPOSURE;  Surgeon: Cephus Shelling, MD;  Location: [facility] OR;  Service: Vascular;  Laterality: N/A;   ANTERIOR LAT LUMBAR FUSION N/A 03/12/2021   Procedure: ANTERIOR LATERAL LUMBAR FUSION 2 LEVELS;  Surgeon: Venita Lick, MD;  Location: [facility] OR;  Service: Orthopedics;  Laterality: N/A;    APPENDECTOMY     BACK SURGERY  1987   CARDIAC CATHETERIZATION     Many Years Ago. Lived in [place]   CATARACT EXTRACTION     CERVICAL LAMINECTOMY  2004   [name], rod was placed- HAS ROM LIMITATIONS   CHOLECYSTECTOMY     COLONOSCOPY     KNEE SURGERY     left knee/arthroscopic   TOTAL HIP ARTHROPLASTY Right 03/14/2013   Procedure: RIGHT TOTAL HIP ARTHROPLASTY ANTERIOR APPROACH;  Surgeon: Shelda Pal, MD;  Location: [facility] ORS;  Service: Orthopedics;  Laterality: Right;   TOTAL HIP ARTHROPLASTY Left 05/23/2019   Procedure: TOTAL HIP ARTHROPLASTY ANTERIOR APPROACH;  Surgeon: Durene Romans, MD;  Location: [facility] ORS;  Service: Orthopedics;  Laterality: Left;  70 mins    Prior to Admission medications   Medication Sig Start Date End Date Taking? Authorizing Provider  amLODipine (NORVASC) 5 MG tablet Take 1 tablet (5 mg total) by mouth daily. 09/10/22  Yes [name], Georgina Quint, MD  Ascorbic Acid (C-500/ROSE HIPS PO) Take by mouth.   Yes [provider]  chlorhexidine (HIBICLENS) 4 % external liquid Apply topically daily as needed. Use 1-2 times in the shower 09/10/22  Yes  [name], Georgina Quint, MD  Cholecalciferol (VITAMIN D3 PO) Take 1 tablet by mouth daily.   Yes [provider]  clotrimazole-betamethasone (LOTRISONE) cream Apply 1 Application topically 2 (two) times daily. 09/10/22  Yes [name], Georgina Quint, MD  Cyanocobalamin (VITAMIN B-12 CR) 1000 MCG TBCR Take 1,000 mcg by mouth daily.     Yes [provider]  ELIQUIS 5 MG TABS tablet TAKE 1 TABLET BY MOUTH 2 TIMES DAILY. 07/13/22  Yes Patwardhan, Manish J, MD  ezetimibe (ZETIA) 10 MG tablet Take 1 tablet (10 mg total) by mouth daily. 01/22/22 01/17/23 Yes Patwardhan, Manish J, MD  FLUoxetine (PROZAC) 40 MG capsule Take 1 capsule (40 mg total) by mouth daily. 11/28/21  Yes [name], Georgina Quint, MD  furosemide (LASIX) 20 MG tablet TAKE 1 TABLET (20 MG TOTAL) BY MOUTH DAILY. 06/17/22  Yes [name], Georgina Quint, MD  gabapentin  (NEURONTIN) 300 MG capsule Take 1 capsule (300 mg total) by mouth 3 (three) times daily. 11/28/21  Yes [name], Georgina Quint, MD  Multiple Vitamin (MULTIVITAMIN WITH MINERALS) TABS tablet Take 1 tablet by mouth daily.   Yes [provider]  mupirocin ointment (BACTROBAN) 2 % On leg wound w/dressing change qd or bid 09/10/22  Yes [name], Georgina Quint, MD  olmesartan-hydrochlorothiazide (BENICAR HCT) 20-12.5 MG tablet Take 1 tablet by mouth daily. 11/28/21  Yes [name], Georgina Quint, MD  oxyCODONE-acetaminophen (PERCOCET) 10-325 MG tablet Take 1 tablet by mouth every 6 (six) hours as needed for pain. Patient taking differently: Take 1 tablet by mouth in the morning, at noon, and at bedtime. 11/28/21 11/28/22 Yes [name], Georgina Quint, MD  pravastatin (PRAVACHOL) 40 MG tablet Take 1 tablet (40 mg total) by mouth every evening. 01/22/22  Yes Patwardhan, Manish J, MD  propranolol ER (INDERAL LA) 80 MG 24 hr capsule Take 1 capsule (80 mg total) by mouth daily. 04/23/22  Yes [name], Georgina Quint, MD  Saw Palmetto, Serenoa repens, (SAW PALMETTO PO) Take 1 capsule by mouth daily.   Yes [provider]  tetrahydrozoline 0.05 % ophthalmic solution Place 1 drop into both eyes 2 (two) times daily as needed (for redness).   Yes [provider]  traZODone (DESYREL) 50 MG tablet Take 1-2 tablets (50-100 mg total) by mouth at bedtime. 03/31/22  Yes [name], Georgina Quint, MD  triamcinolone ointment (KENALOG) 0.1 % Apply 1 Application topically 4 (four) times daily. Patient taking differently: Apply 1 Application topically daily as needed (irritation). 04/23/22  Yes [name], Georgina Quint, MD  zinc gluconate 50 MG tablet Take 50 mg by mouth daily.   Yes [provider]  acetaminophen (TYLENOL) 325 MG tablet Take 325-650 mg by mouth every 6 (six) hours as needed for mild pain or headache.    [provider]  PRESCRIPTION MEDICATION CPAP- At bedtime    [provider]  sodium chloride  (OCEAN) 0.65 % SOLN nasal spray Place 1 spray into both nostrils as needed for congestion.    [provider]   Allergies  Allergen Reactions   Abilify [Aripiprazole] Shortness Of Breath    Made pt "feel crazy"   Cymbalta [Duloxetine Hcl] Other (See Comments)    Constipation    Digoxin And Related Other (See Comments)    HR 41 (patient is unsure about this)   Prednisolone Swelling and Other (See Comments)    Edema, weight gain   Temazepam Other (See Comments)    "Made me feel crazy"   Zanaflex [Tizanidine]     Low BP,  speech dysturbance. Possible   Other Rash and Other (See Comments)    Zomax/Zomepirac- Welts, also    Social History   Tobacco Use   Smoking status: Former    Packs/day: 0.50    Years: 12.00    Additional pack years: 0.00    Total pack years: 6.00    Types: Cigarettes    Quit date: 03/31/1971    Years since quitting: 51.4   Smokeless tobacco: Never   Tobacco comments:    Started at age 21  Substance Use Topics   Alcohol use: Not Currently    Comment: occasionally    Family History  Problem Relation Age of Onset   Allergies Mother    Stroke Mother        Clotting disorders   Heart disease Mother    Heart disease Father    Heart attack Father 72   Heart disease Brother    Rheum arthritis Paternal Grandmother    Other Paternal Grandmother        Rheumatism   Coronary artery disease Other    Diabetes Other    Hypertension Other    Esophageal cancer Maternal Aunt    Colon cancer Neg Hx    Rectal cancer Neg Hx    Stomach cancer Neg Hx      Review of Systems  Positive ROS: neg  All other systems have been reviewed and were otherwise negative with the exception of those mentioned in the HPI and as above.  Objective: Vital signs in last 24 hours: Temp:  [98.3 F (36.8 C)-98.6 F (37 C)] 98.6 F (37 C) (06/14 0802) Pulse Rate:  [44-46] 46 (06/14 0802) Resp:  [18] 18 (06/14 0802) BP: (110-153)/(52-68) 153/52 (06/14 0803) SpO2:  [93  %-95 %] 95 % (06/14 0802) Weight:  [102.5 kg] 102.5 kg (06/14 0802)  General Appearance: Alert, cooperative, no distress, appears stated age Head: Normocephalic, without obvious abnormality, atraumatic Eyes: PERRL, conjunctiva/corneas clear, EOM's intact    Neck: Supple, symmetrical, trachea midline Back: Symmetric, no curvature, ROM normal, no CVA tenderness Lungs:  respirations unlabored Heart: Regular rate and rhythm Abdomen: Soft, non-tender Extremities: Extremities normal, atraumatic, no cyanosis or edema Pulses: 2+ and symmetric all extremities Skin: Skin color, texture, turgor normal, no rashes or lesions  NEUROLOGIC:   Mental status: Alert and oriented x4,  no aphasia, good attention span, fund of knowledge, and memory Motor Exam - weak at shoulder girdles, L proximal leg 4/5 Sensory Exam - grossly normal Reflexes: 1+ Coordination - grossly normal Gait - NOT TESTED Balance - not tested Cranial Nerves: I: smell Not tested  II: visual acuity  OS: nl    OD: nl  II: visual fields Full to confrontation  II: pupils Equal, round, reactive to light  III,VII: ptosis None  III,IV,VI: extraocular muscles  Full ROM  V: mastication Normal  V: facial light touch sensation  Normal  V,VII: corneal reflex  Present  VII: facial muscle function - upper  Normal  VII: facial muscle function - lower Normal  VIII: hearing Not tested  IX: soft palate elevation  Normal  IX,X: gag reflex Present  XI: trapezius strength  5/5  XI: sternocleidomastoid strength 5/5  XI: neck flexion strength  5/5  XII: tongue strength  Normal    Data Review Lab Results  Component Value Date   WBC 5.5 09/08/2022   HGB 12.4 (L) 09/08/2022   HCT 36.9 (L) 09/08/2022   MCV 90.9 09/08/2022   PLT 171 09/08/2022  Lab Results  Component Value Date   NA 138 09/08/2022   K 3.8 09/08/2022   CL 104 09/08/2022   CO2 23 09/08/2022   BUN 19 09/08/2022   CREATININE 0.94 09/08/2022   GLUCOSE 89 09/08/2022    Lab Results  Component Value Date   INR 1.2 04/22/2022    Assessment/Plan:  Estimated body mass index is 30.65 kg/m as calculated from the following:   Height as of this encounter: 6' (1.829 m).   Weight as of this encounter: 102.5 kg. Patient admitted for L1-2 and T11-12 laminectomy. Patient has failed a reasonable attempt at conservative therapy.  I explained the condition and procedure to the patient and answered any questions.  Patient wishes to proceed with procedure as planned. Understands risks/ benefits and typical outcomes of procedure.   Tia Alert 09/11/2022 9:28 AM

## 2022-09-11 NOTE — Anesthesia Procedure Notes (Signed)
Procedure Name: Intubation Date/Time: 09/11/2022 10:25 AM  Performed by: Alwyn Ren, CRNAPre-anesthesia Checklist: Patient identified, Emergency Drugs available, Suction available and Patient being monitored Patient Re-evaluated:Patient Re-evaluated prior to induction Oxygen Delivery Method: Circle system utilized Preoxygenation: Pre-oxygenation with 100% oxygen Induction Type: IV induction Ventilation: Two handed mask ventilation required Laryngoscope Size: Glidescope and 3 Grade View: Grade I Tube type: Oral Tube size: 7.0 mm Number of attempts: 1 Airway Equipment and Method: Stylet and Oral airway Placement Confirmation: ETT inserted through vocal cords under direct vision, positive ETCO2 and breath sounds checked- equal and bilateral Secured at: 22 cm Tube secured with: Tape Dental Injury: Teeth and Oropharynx as per pre-operative assessment  Comments: Head and neck in alignment

## 2022-09-12 ENCOUNTER — Encounter (HOSPITAL_COMMUNITY): Payer: Self-pay | Admitting: Neurological Surgery

## 2022-09-12 DIAGNOSIS — M4805 Spinal stenosis, thoracolumbar region: Secondary | ICD-10-CM | POA: Diagnosis not present

## 2022-09-12 MED ORDER — OXYCODONE-ACETAMINOPHEN 5-325 MG PO TABS: 1.0000 | ORAL_TABLET | ORAL | 0 refills | Status: DC | PRN

## 2022-09-12 MED ORDER — METHOCARBAMOL 500 MG PO TABS: 500.0000 mg | ORAL_TABLET | Freq: Four times a day (QID) | ORAL | 1 refills | Status: DC | PRN

## 2022-09-12 NOTE — Care Management (Signed)
Patient with order to DC to home today. Unit staff to provide DME needed for home.   No HH needs identified Patient will have family/ friends provide transportation home. No other TOC needs identified for DC 

## 2022-09-12 NOTE — Evaluation (Signed)
Occupational Therapy Evaluation Patient Details Name: Vernon Bishop MRN: [id number] DOB: 17-Aug-1940 Today's Date: 09/12/2022   History of Present Illness Patient is a 82 y.o. male admitted for gait instability and leg weakness. CT showed stenosis L1-2 and T11-12. T11-12 decompressive thoracic laminectomy, medial facetectomy and foraminotomies and L1-2 decompressive lumbar laminectomy, medial facetectomy and foraminotomies performed  09/11/22. PMHx: HTN, OSA on CPAP, remote history of A-fib, pontine stroke (05/2021), pulmonary hypertension, asymptomatic bradycardia without significant AV conduction abnormality.   Clinical Impression   Pt evaluated s/p above admission list. Pt reports independence with mobility without AD and assist for LB ADLs and IADLs at baseline from wife. Pt currently requires setup A for seated UB ADLs and min A for LB ADLs using reacher as pt unable to achieve figure four position. Pt completes STS transfers with and without RW with supervision. Pt provided with back precaution handout and educated on precautions throughout session with pt demonstrating/verbalizing understanding. Pt does not require further OT services at this time.       Recommendations for follow up therapy are one component of a multi-disciplinary discharge planning process, led by the attending physician.  Recommendations may be updated based on patient status, additional functional criteria and insurance authorization.   Assistance Recommended at Discharge Intermittent Supervision/Assistance  Patient can return home with the following Assistance with cooking/housework;Assist for transportation;A little help with walking and/or transfers;A little help with bathing/dressing/bathroom    Functional Status Assessment  Patient has had a recent decline in their functional status and demonstrates the ability to make significant improvements in function in a reasonable and predictable amount of time.  Equipment  Recommendations  None recommended by OT    Recommendations for Other Services       Precautions / Restrictions Precautions Precautions: Back Precaution Booklet Issued: Yes (comment) Precaution Comments: Handout and education provided throughout session Restrictions Weight Bearing Restrictions: No      Mobility Bed Mobility Overal bed mobility: Needs Assistance Bed Mobility: Supine to Sit, Sit to Supine Rolling: Modified independent (Device/Increase time) Sidelying to sit: Modified independent (Device/Increase time), HOB elevated     Sit to sidelying: Modified independent (Device/Increase time), HOB elevated General bed mobility comments: HOB elevated, use of bed rail    Transfers Overall transfer level: Needs assistance Equipment used: Rolling walker (2 wheels) Transfers: Sit to/from Stand Sit to Stand: Supervision           General transfer comment: Performed STS transfer from EOB with and without RW with supervision      Balance Overall balance assessment: Needs assistance Sitting-balance support: Feet supported, No upper extremity supported Sitting balance-Leahy Scale: Good Sitting balance - Comments: sitting EOB   Standing balance support: Bilateral upper extremity supported, During functional activity, Reliant on assistive device for balance Standing balance-Leahy Scale: Poor Standing balance comment: use of BUE support on RW                           ADL either performed or assessed with clinical judgement   ADL Overall ADL's : Needs assistance/impaired Eating/Feeding: Sitting;Modified independent   Grooming: Supervision/safety;Standing   Upper Body Bathing: Set up;Sitting   Lower Body Bathing: Sit to/from stand;Minimal assistance   Upper Body Dressing : Set up;Sitting   Lower Body Dressing: Minimal assistance;Sit to/from stand Lower Body Dressing Details (indicate cue type and reason): Pt donned pants while alternating  sitting/standing EOB using reacher with min A for threading LLE. Pt unable to  acheive figure four, educated on use of reacher for independence with LB dressing. Toilet Transfer: Retail banker;Ambulation Toilet Transfer Details (indicate cue type and reason): simulated Toileting- Clothing Manipulation and Hygiene: Supervision/safety;Sit to/from stand       Functional mobility during ADLs: Supervision/safety;Rolling walker (2 wheels) General ADL Comments: Generalized supervision for safety with standing ADLs, mod verbal/visual cues on how to complete ADLs within precautions using reacher. Pt receives assist for LB ADLs at baseline.     Vision Baseline Vision/History: 0 No visual deficits Ability to See in Adequate Light: 0 Adequate Vision Assessment?: No apparent visual deficits     Perception Perception Perception Tested?: No   Praxis Praxis Praxis tested?: Not tested    Pertinent Vitals/Pain Pain Assessment Pain Assessment: Faces Faces Pain Scale: Hurts little more Pain Location: Incision Pain Descriptors / Indicators: Discomfort, Guarding Pain Intervention(s): Limited activity within patient's tolerance, Monitored during session     Hand Dominance Right   Extremity/Trunk Assessment Upper Extremity Assessment Upper Extremity Assessment: Overall WFL for tasks assessed   Lower Extremity Assessment Lower Extremity Assessment: Defer to PT evaluation   Cervical / Trunk Assessment Cervical / Trunk Assessment: Back Surgery   Communication Communication Communication: No difficulties   Cognition Arousal/Alertness: Awake/alert Behavior During Therapy: WFL for tasks assessed/performed Overall Cognitive Status: Within Functional Limits for tasks assessed                                 General Comments: Pt pleasant and cooperative, receptive to education on precautions     General Comments  VSS on RA    Exercises     Shoulder  Instructions      Home Living Family/patient expects to be discharged to:: Private residence Living Arrangements: Spouse/significant other Available Help at Discharge: Family;Available 24 hours/day Type of Home: House Home Access: Stairs to enter [employer] of Steps: 5 Entrance Stairs-Rails: Can reach both Home Layout: One level     Bathroom Shower/Tub: Tub/shower unit;Walk-in shower   Bathroom Toilet: Standard Bathroom Accessibility: Yes   Home Equipment: Agricultural consultant (2 wheels);Cane - single point;BSC/3in1;Grab bars - tub/shower;Shower seat   Additional Comments: Wife available 24/7 to assist as needed      Prior Functioning/Environment Prior Level of Function : Independent/Modified Independent             Mobility Comments: Independent with all functional mobility ADLs Comments: Recieves assist from wife for socks and threading BLEs through garments at baseline. Wife assist with cooking/cleaning.        OT Problem List: Decreased strength;Decreased range of motion;Decreased activity tolerance;Impaired balance (sitting and/or standing);Decreased knowledge of precautions;Pain      OT Treatment/Interventions:      OT Goals(Current goals can be found in the care plan section) Acute Rehab OT Goals Patient Stated Goal: to go home OT Goal Formulation: With patient Time For Goal Achievement: 09/26/22 Potential to Achieve Goals: Good  OT Frequency:      Co-evaluation              AM-PAC OT "6 Clicks" Daily Activity     Outcome Measure Help from another person eating meals?: None Help from another person taking care of personal grooming?: A Little Help from another person toileting, which includes using toliet, bedpan, or urinal?: A Little Help from another person bathing (including washing, rinsing, drying)?: A Little Help from another person to put on and taking off regular upper body clothing?:  A Little Help from another person to put on and  taking off regular lower body clothing?: A Little 6 Click Score: 19   End of Session Equipment Utilized During Treatment: Rolling walker (2 wheels) Nurse Communication: Mobility status  Activity Tolerance: Patient tolerated treatment well Patient left: in bed;with call bell/phone within reach  OT Visit Diagnosis: Unsteadiness on feet (R26.81);Other abnormalities of gait and mobility (R26.89);Other (comment);Pain (back surgery)                Time: 5284-1324 OT Time Calculation (min): 12 min Charges:  OT General Charges $OT Visit: 1 Visit OT Evaluation $OT Eval Moderate Complexity: 1 Mod  Sherley Bounds, OTS Acute Rehabilitation Services Office [phone] Secure Chat Communication Preferred   Sherley Bounds 09/12/2022, 9:49 AM

## 2022-09-12 NOTE — Discharge Summary (Signed)
Physician Discharge Summary  Patient ID: BRODEE MCGILLIVARY MRN: [id number] DOB/AGE: 82-Jun-1942 82 y.o.  Admit date: 09/11/2022 Discharge date: 09/12/2022  Admission Diagnoses:  Discharge Diagnoses:  Principal Problem:   S/P lumbar laminectomy   Discharged Condition: good  Hospital Course: Patient admitted to hospital where he underwent uncomplicated lumbar decompressive surgery.  Postoperative doing very well.  Standing and ambulating and voiding without difficulty.  Patient ready for discharge home.  Consults:   Significant Diagnostic Studies:   Treatments:   Discharge Exam: Blood pressure (!) 135/55, pulse (!) 57, temperature 98.3 F (36.8 C), temperature source Oral, resp. rate 18, height 6' (1.829 m), weight 102.5 kg, SpO2 99 %. Awake and alert.  Oriented and appropriate.  Motor and sensory function intact.  Wound clean and dry.  Chest and abdomen benign.  Disposition: Discharge disposition: 01-Home or Self Care        Allergies as of 09/12/2022       Reactions   Abilify [aripiprazole] Shortness Of Breath   Made pt "feel crazy"   Cymbalta [duloxetine Hcl] Other (See Comments)   Constipation   Digoxin And Related Other (See Comments)   HR 41 (patient is unsure about this)   Prednisolone Swelling, Other (See Comments)   Edema, weight gain   Temazepam Other (See Comments)   "Made me feel crazy"   Zanaflex [tizanidine]    Low BP, speech dysturbance. Possible   Other Rash, Other (See Comments)   Zomax/Zomepirac- Welts, also        Medication List     TAKE these medications    acetaminophen 325 MG tablet Commonly known as: TYLENOL Take 325-650 mg by mouth every 6 (six) hours as needed for mild pain or headache.   amLODipine 5 MG tablet Commonly known as: NORVASC Take 1 tablet (5 mg total) by mouth daily.   C-500/ROSE HIPS PO Take by mouth.   chlorhexidine 4 % external liquid Commonly known as: HIBICLENS Apply topically daily as needed. Use 1-2 times  in the shower   clotrimazole-betamethasone cream Commonly known as: LOTRISONE Apply 1 Application topically 2 (two) times daily.   Eliquis 5 MG Tabs tablet Generic drug: apixaban TAKE 1 TABLET BY MOUTH 2 TIMES DAILY.   ezetimibe 10 MG tablet Commonly known as: ZETIA Take 1 tablet (10 mg total) by mouth daily.   FLUoxetine 40 MG capsule Commonly known as: PROZAC Take 1 capsule (40 mg total) by mouth daily.   furosemide 20 MG tablet Commonly known as: LASIX TAKE 1 TABLET (20 MG TOTAL) BY MOUTH DAILY.   gabapentin 300 MG capsule Commonly known as: NEURONTIN Take 1 capsule (300 mg total) by mouth 3 (three) times daily.   methocarbamol 500 MG tablet Commonly known as: ROBAXIN Take 1 tablet (500 mg total) by mouth every 6 (six) hours as needed for muscle spasms.   multivitamin with minerals Tabs tablet Take 1 tablet by mouth daily.   mupirocin ointment 2 % Commonly known as: BACTROBAN On leg wound w/dressing change qd or bid   olmesartan-hydrochlorothiazide 20-12.5 MG tablet Commonly known as: BENICAR HCT Take 1 tablet by mouth daily.   oxyCODONE-acetaminophen 10-325 MG tablet Commonly known as: Percocet Take 1 tablet by mouth every 6 (six) hours as needed for pain. What changed: when to take this   oxyCODONE-acetaminophen 5-325 MG tablet Commonly known as: PERCOCET/ROXICET Take 1 tablet by mouth every 4 (four) hours as needed for moderate pain. What changed: You were already taking a medication with the same name,  and this prescription was added. Make sure you understand how and when to take each.   pravastatin 40 MG tablet Commonly known as: PRAVACHOL Take 1 tablet (40 mg total) by mouth every evening.   PRESCRIPTION MEDICATION CPAP- At bedtime   propranolol ER 80 MG 24 hr capsule Commonly known as: Inderal LA Take 1 capsule (80 mg total) by mouth daily.   SAW PALMETTO PO Take 1 capsule by mouth daily.   sodium chloride 0.65 % Soln nasal spray Commonly  known as: OCEAN Place 1 spray into both nostrils as needed for congestion.   tetrahydrozoline 0.05 % ophthalmic solution Place 1 drop into both eyes 2 (two) times daily as needed (for redness).   traZODone 50 MG tablet Commonly known as: DESYREL Take 1-2 tablets (50-100 mg total) by mouth at bedtime.   triamcinolone ointment 0.1 % Commonly known as: KENALOG Apply 1 Application topically 4 (four) times daily. What changed:  when to take this reasons to take this   Vitamin B-12 CR 1000 MCG Tbcr Take 1,000 mcg by mouth daily.   VITAMIN D3 PO Take 1 tablet by mouth daily.   zinc gluconate 50 MG tablet Take 50 mg by mouth daily.               Durable Medical Equipment  (From admission, onward)           Start     Ordered   09/11/22 1458  DME Walker rolling  Once       Question Answer Comment  Walker: With 5 Inch Wheels   Patient needs a walker to treat with the following condition Gait instability      09/11/22 1457             Signed: Kathaleen Maser Uzziah Rigg 09/12/2022, 10:09 AM

## 2022-09-12 NOTE — Plan of Care (Signed)
  Problem: Education: Goal: Knowledge of General Education information will improve Description: Including pain rating scale, medication(s)/side effects and non-pharmacologic comfort measures Outcome: Adequate for Discharge   Problem: Health Behavior/Discharge Planning: Goal: Ability to manage health-related needs will improve Outcome: Adequate for Discharge   Problem: Clinical Measurements: Goal: Ability to maintain clinical measurements within normal limits will improve Outcome: Adequate for Discharge Goal: Will remain free from infection Outcome: Adequate for Discharge Goal: Diagnostic test results will improve Outcome: Adequate for Discharge Goal: Respiratory complications will improve Outcome: Adequate for Discharge Goal: Cardiovascular complication will be avoided Outcome: Adequate for Discharge   Problem: Activity: Goal: Risk for activity intolerance will decrease Outcome: Adequate for Discharge   Problem: Nutrition: Goal: Adequate nutrition will be maintained Outcome: Adequate for Discharge   Problem: Coping: Goal: Level of anxiety will decrease Outcome: Adequate for Discharge   Problem: Elimination: Goal: Will not experience complications related to bowel motility Outcome: Adequate for Discharge Goal: Will not experience complications related to urinary retention Outcome: Adequate for Discharge   Problem: Pain Managment: Goal: General experience of comfort will improve Outcome: Adequate for Discharge   Problem: Safety: Goal: Ability to remain free from injury will improve Outcome: Adequate for Discharge   Problem: Skin Integrity: Goal: Risk for impaired skin integrity will decrease Outcome: Adequate for Discharge   Problem: Education: Goal: Ability to verbalize activity precautions or restrictions will improve Outcome: Adequate for Discharge Goal: Knowledge of the prescribed therapeutic regimen will improve Outcome: Adequate for Discharge Goal:  Understanding of discharge needs will improve Outcome: Adequate for Discharge   Problem: Activity: Goal: Ability to avoid complications of mobility impairment will improve Outcome: Adequate for Discharge Goal: Ability to tolerate increased activity will improve Outcome: Adequate for Discharge Goal: Will remain free from falls Outcome: Adequate for Discharge   Problem: Bowel/Gastric: Goal: Gastrointestinal status for postoperative course will improve Outcome: Adequate for Discharge   Problem: Clinical Measurements: Goal: Ability to maintain clinical measurements within normal limits will improve Outcome: Adequate for Discharge Goal: Postoperative complications will be avoided or minimized Outcome: Adequate for Discharge Goal: Diagnostic test results will improve Outcome: Adequate for Discharge   Problem: Pain Management: Goal: Pain level will decrease Outcome: Adequate for Discharge   Problem: Skin Integrity: Goal: Will show signs of wound healing Outcome: Adequate for Discharge   Problem: Health Behavior/Discharge Planning: Goal: Identification of resources available to assist in meeting health care needs will improve Outcome: Adequate for Discharge   Problem: Bladder/Genitourinary: Goal: Urinary functional status for postoperative course will improve Outcome: Adequate for Discharge   Problem: Acute Rehab PT Goals(only PT should resolve) Goal: Pt Will Ambulate Outcome: Adequate for Discharge Goal: Pt Will Go Up/Down Stairs Outcome: Adequate for Discharge Goal: Pt Will Verbalize and Adhere to Precautions While Description: PT Will Verbalize and Adhere to Precautions While Performing Mobility Outcome: Adequate for Discharge

## 2022-09-12 NOTE — Progress Notes (Signed)
Explained discharge instructions to patient. Reviewed follow up appointment and next medication administration times. Also reviewed post surgical education. Patient verbalized having an understanding for instructions given. All belongings are in the patient's possession. IV was removed by [facility] the nightshift RN. No other needs verbalized. Transported downstairs for discharge.

## 2022-09-12 NOTE — Evaluation (Signed)
Physical Therapy Evaluation Patient Details Name: Vernon Bishop MRN: [id number] DOB: May 15, 1940 Today's Date: 09/12/2022  History of Present Illness  Patient is a 82 y.o. male presenting s/p lumbar surgery (09/12/2022). PMH significant for HLD, HTN, Left [place] Stroke, s/p THA (05/23/2019), Anterior Lat Lumbar Fusion (03/12/2021).  Clinical Impression  Upon evaluation, patient supine with HOB elevated alert and oriented. Prior to admission, patient was independent with all functional mobility and set up assistance for dressing lower extremities. He is supported by his spouse who is available to assist as needed. Currently, patient able to perform transfers and functional mobility without physical assistance. Provided verbal cues on lumbar precautions throughout session with good return demonstration. Presented with forward lean with ascending stairway, provided min guard for safety. End of session included, education on car transfers, exercise progression and recall of precautions. Anticipating discharge to home with family to support as needed.      Recommendations for follow up therapy are one component of a multi-disciplinary discharge planning process, led by the attending physician.  Recommendations may be updated based on patient status, additional functional criteria and insurance authorization.  Follow Up Recommendations       Assistance Recommended at Discharge Set up Supervision/Assistance  Patient can return home with the following  A little help with walking and/or transfers;A little help with bathing/dressing/bathroom;Assist for transportation;Help with stairs or ramp for entrance    Equipment Recommendations None recommended by PT  Recommendations for Other Services       Functional Status Assessment Patient has had a recent decline in their functional status and demonstrates the ability to make significant improvements in function in a reasonable and predictable amount of  time.     Precautions / Restrictions Precautions Precautions: Back Precaution Booklet Issued: Yes (comment) Precaution Comments: Handout provided and verbalized precaution throughout functional mobility. Restrictions Weight Bearing Restrictions: No      Mobility  Bed Mobility Overal bed mobility: Needs Assistance Bed Mobility: Rolling, Sidelying to Sit, Sit to Sidelying Rolling: Modified independent (Device/Increase time) Sidelying to sit: Modified independent (Device/Increase time)     Sit to sidelying: Supervision General bed mobility comments: Able to perform bed mobility without physical assistance; cued for hand placements and proper rolling technique when transitioning from EOB to supine; HOB lowered and bed rail lowered to simulate home environment    Transfers Overall transfer level: Needs assistance Equipment used: Rolling walker (2 wheels) Transfers: Sit to/from Stand Sit to Stand: Supervision           General transfer comment: Able to perform without physical assistance and RW support; used bilateral UE to stand up; cued to sit at EOB prior to power up.    Ambulation/Gait Ambulation/Gait assistance: Supervision Gait Distance (Feet): 300 Feet Assistive device: Rolling walker (2 wheels) Gait Pattern/deviations: Step-through pattern, Decreased step length - right, Decreased step length - left, Decreased stride length Gait velocity: descreased Gait velocity interpretation: <1.8 ft/sec, indicate of risk for recurrent falls   General Gait Details: Cued for upright stance; presented with genu varus bilateral; pt endorsed gait velocity close to baseline  Stairs Stairs: Yes Stairs assistance: Min guard Stair Management: Two rails, Alternating pattern, Forwards Number of Stairs: 5 General stair comments: Able to perform without physical assistance; min guard for safety. Presented with forward lean ascending stairs with both rails; no reports of additional pain.  Improved upright posture with descent and very cautious with left LE due to reported OA.  Wheelchair Mobility    Modified Rankin (Stroke  Patients Only)       Balance Overall balance assessment: Mild deficits observed, not formally tested                                           Pertinent Vitals/Pain Pain Assessment Pain Assessment: 0-10 Pain Score: 6  Pain Location: Incision Pain Descriptors / Indicators: Throbbing Pain Intervention(s): Monitored during session, Limited activity within patient's tolerance    Home Living Family/patient expects to be discharged to:: Private residence Living Arrangements: Spouse/significant other Available Help at Discharge: Family;Available 24 hours/day Type of Home: House Home Access: Stairs to enter Entrance Stairs-Rails: Can reach both Entrance Stairs-Number of Steps: 5   Home Layout: One level Home Equipment: Agricultural consultant (2 wheels);Cane - single point;BSC/3in1;Grab bars - tub/shower;Shower seat      Prior Function Prior Level of Function : Independent/Modified Independent             Mobility Comments: Independent with all functional mobility ADLs Comments: Modified Independent; Set up assistance with dressing lower extremties     Hand Dominance   Dominant Hand: Right    Extremity/Trunk Assessment   Upper Extremity Assessment Upper Extremity Assessment: Defer to OT evaluation    Lower Extremity Assessment Lower Extremity Assessment: Overall WFL for tasks assessed    Cervical / Trunk Assessment Cervical / Trunk Assessment: Back Surgery  Communication   Communication: No difficulties (Pleasant throughout session)  Cognition   Behavior During Therapy: WFL for tasks assessed/performed Overall Cognitive Status: Within Functional Limits for tasks assessed                                 General Comments: Alert and Oriented x3        General Comments General comments (skin  integrity, edema, etc.): Incision site dry and intact    Exercises     Assessment/Plan    PT Assessment Patient does not need any further PT services  PT Problem List         PT Treatment Interventions      PT Goals (Current goals can be found in the Care Plan section)  Acute Rehab PT Goals Patient Stated Goal: Pt reported wanting to walk without pain and [place] Hable home to family PT Goal Formulation: All assessment and education complete, DC therapy    Frequency       Co-evaluation               AM-PAC PT "6 Clicks" Mobility  Outcome Measure Help needed turning from your back to your side while in a flat bed without using bedrails?: None Help needed moving from lying on your back to sitting on the side of a flat bed without using bedrails?: None Help needed moving to and from a bed to a chair (including a wheelchair)?: None Help needed standing up from a chair using your arms (e.g., wheelchair or bedside chair)?: A Little Help needed to walk in hospital room?: A Little Help needed climbing 3-5 steps with a railing? : A Little 6 Click Score: 21    End of Session Equipment Utilized During Treatment: Gait belt Activity Tolerance: Patient tolerated treatment well Patient left: in bed;with call bell/phone within reach Nurse Communication: Mobility status PT Visit Diagnosis: Difficulty in walking, not elsewhere classified (R26.2);Pain Pain - Right/Left: Right Pain - part of body:  Leg (Also Lower back)    Time: 1610-9604 PT Time Calculation (min) (ACUTE ONLY): 22 min   Charges:              Christene Lye, SPT Acute Rehabilitation Services [phone] Secure chat preferred    Christene Lye 09/12/2022, 8:59 AM

## 2022-09-12 NOTE — Discharge Instructions (Addendum)
Wound Care Keep incision covered and dry for three days.   Do not put any creams, lotions, or ointments on incision. Leave steri-strips on back.  They will fall off by themselves. Activity Walk each and every day, increasing distance each day. No lifting greater than 8 lbs.  Avoid excessive neck motion. No driving for 2 weeks; may ride as a passenger locally.  Diet Resume your normal diet.  Return to Work Will be discussed at you follow up appointment. Call Your Doctor If Any of These Occur Redness, drainage, or swelling at the wound.  Temperature greater than 101 degrees. Severe pain not relieved by pain medication. Incision starts to come apart. Follow Up Appt Call  [phone])  for problems.  If you have any hardware placed in your spine, you will need an x-ray before your appointment.  Restart Eliquis on Tuesday

## 2022-09-14 ENCOUNTER — Ambulatory Visit: Payer: PPO | Admitting: Cardiology

## 2022-09-14 MED FILL — Thrombin For Soln 5000 Unit: CUTANEOUS | Qty: 2 | Status: AC

## 2022-10-05 DIAGNOSIS — M549 Dorsalgia, unspecified: Secondary | ICD-10-CM | POA: Diagnosis not present

## 2022-10-05 DIAGNOSIS — M5136 Other intervertebral disc degeneration, lumbar region: Secondary | ICD-10-CM | POA: Diagnosis not present

## 2022-10-05 DIAGNOSIS — Z981 Arthrodesis status: Secondary | ICD-10-CM | POA: Diagnosis not present

## 2022-10-14 ENCOUNTER — Ambulatory Visit: Payer: PPO | Admitting: Cardiology

## 2022-10-16 ENCOUNTER — Encounter: Payer: Self-pay | Admitting: Cardiology

## 2022-10-16 ENCOUNTER — Ambulatory Visit: Payer: PPO | Admitting: Cardiology

## 2022-10-16 VITALS — BP 137/71 | HR 43 | Resp 16 | Ht 72.0 in | Wt 219.2 lb

## 2022-10-16 DIAGNOSIS — I480 Paroxysmal atrial fibrillation: Secondary | ICD-10-CM | POA: Diagnosis not present

## 2022-10-16 DIAGNOSIS — R001 Bradycardia, unspecified: Secondary | ICD-10-CM | POA: Diagnosis not present

## 2022-10-16 DIAGNOSIS — E782 Mixed hyperlipidemia: Secondary | ICD-10-CM

## 2022-10-16 DIAGNOSIS — I10 Essential (primary) hypertension: Secondary | ICD-10-CM | POA: Diagnosis not present

## 2022-10-16 NOTE — Progress Notes (Signed)
Patient referred by [name], Georgina Quint, MD for pulmonary hypertension, preoperative risk stratification  Subjective:   Vernon Bishop, male    DOB: [birth year], 82 y.o.   MRN: [id number]  Chief Complaint  Patient presents with   Paroxysmal atrial fibrillation (HCC)   Follow-up    3 months    HPI  82 y.o. Caucasian male with hypertension, PAF, OSA, pontine stroke (05/2021)  Patient is doing well, denies chest pain, shortness of breath, palpitations, leg edema, orthopnea, PND, TIA/syncope. Leg swelling has resolved.   Initial consultation visit 12/2020: Patient is here with his wife today.  He was originally scheduled to undergo L4-L5 OLIF surgery with Dr. Shon Baton, assisted by Dr. Chestine Spore for abdominal exposure.  However, surgery was delayed due to recent finding of pulmonary hypertension on echocardiogram.  He was referred to me for evaluation and management of the same, along with preoperative cardiac risk stratification.  Patient is retired, previously worked as a Industrial/product designer.  Currently, his physical activity is significantly limited due to his back pain.  He walks with a cane or a grocery store cart when he is shopping.  He does not have to climb a flight of stairs anywhere.  He walks with a slow pace.  With this level of physical activity, he denies any significant chest pain or shortness of breath symptoms.  He has had longstanding history of OSA, on CPAP for at least 10 years.  He quit smoking in 1970s.  He had atrial fibrillation in 1992, after which he was hospitalized for a day.  He had spontaneous self conversion back to sinus rhythm.  He did report significant palpitations with his A. fib in 1992.  Since then, he has not experienced similar palpitation symptoms, but does report "skipped beat" every now and then.   Current Outpatient Medications:    acetaminophen (TYLENOL) 325 MG tablet, Take 325-650 mg by mouth every 6 (six) hours as needed for mild pain or headache., Disp: ,  Rfl:    amLODipine (NORVASC) 5 MG tablet, Take 1 tablet (5 mg total) by mouth daily., Disp: 90 tablet, Rfl: 3   Ascorbic Acid (C-500/ROSE HIPS PO), Take by mouth., Disp: , Rfl:    chlorhexidine (HIBICLENS) 4 % external liquid, Apply topically daily as needed. Use 1-2 times in the shower, Disp: 500 mL, Rfl: 0   Cholecalciferol (VITAMIN D3 PO), Take 1 tablet by mouth daily., Disp: , Rfl:    clotrimazole-betamethasone (LOTRISONE) cream, Apply 1 Application topically 2 (two) times daily., Disp: 45 g, Rfl: 1   Cyanocobalamin (VITAMIN B-12 CR) 1000 MCG TBCR, Take 1,000 mcg by mouth daily.  , Disp: , Rfl:    ELIQUIS 5 MG TABS tablet, TAKE 1 TABLET BY MOUTH 2 TIMES DAILY., Disp: 60 tablet, Rfl: 3   ezetimibe (ZETIA) 10 MG tablet, Take 1 tablet (10 mg total) by mouth daily., Disp: 30 tablet, Rfl: 3   FLUoxetine (PROZAC) 40 MG capsule, Take 1 capsule (40 mg total) by mouth daily., Disp: 90 capsule, Rfl: 2   furosemide (LASIX) 20 MG tablet, TAKE 1 TABLET (20 MG TOTAL) BY MOUTH DAILY., Disp: 30 tablet, Rfl: 3   gabapentin (NEURONTIN) 300 MG capsule, Take 1 capsule (300 mg total) by mouth 3 (three) times daily., Disp: 270 capsule, Rfl: 3   methocarbamol (ROBAXIN) 500 MG tablet, Take 1 tablet (500 mg total) by mouth every 6 (six) hours as needed for muscle spasms., Disp: 30 tablet, Rfl: 1   Multiple Vitamin (MULTIVITAMIN WITH  MINERALS) TABS tablet, Take 1 tablet by mouth daily., Disp: , Rfl:    mupirocin ointment (BACTROBAN) 2 %, On leg wound w/dressing change qd or bid, Disp: 30 g, Rfl: 0   olmesartan-hydrochlorothiazide (BENICAR HCT) 20-12.5 MG tablet, Take 1 tablet by mouth daily., Disp: 90 tablet, Rfl: 3   oxyCODONE-acetaminophen (PERCOCET) 10-325 MG tablet, Take 1 tablet by mouth every 6 (six) hours as needed for pain. (Patient taking differently: Take 1 tablet by mouth in the morning, at noon, and at bedtime.), Disp: 15 tablet, Rfl: 0   oxyCODONE-acetaminophen (PERCOCET/ROXICET) 5-325 MG tablet, Take 1  tablet by mouth every 4 (four) hours as needed for moderate pain., Disp: 30 tablet, Rfl: 0   pravastatin (PRAVACHOL) 40 MG tablet, Take 1 tablet (40 mg total) by mouth every evening., Disp: 90 tablet, Rfl: 3   PRESCRIPTION MEDICATION, CPAP- At bedtime, Disp: , Rfl:    propranolol ER (INDERAL LA) 80 MG 24 hr capsule, Take 1 capsule (80 mg total) by mouth daily., Disp: 90 capsule, Rfl: 3   Saw Palmetto, Serenoa repens, (SAW PALMETTO PO), Take 1 capsule by mouth daily., Disp: , Rfl:    sodium chloride (OCEAN) 0.65 % SOLN nasal spray, Place 1 spray into both nostrils as needed for congestion., Disp: , Rfl:    tetrahydrozoline 0.05 % ophthalmic solution, Place 1 drop into both eyes 2 (two) times daily as needed (for redness)., Disp: , Rfl:    traZODone (DESYREL) 50 MG tablet, Take 1-2 tablets (50-100 mg total) by mouth at bedtime., Disp: 60 tablet, Rfl: 5   triamcinolone ointment (KENALOG) 0.1 %, Apply 1 Application topically 4 (four) times daily. (Patient taking differently: Apply 1 Application topically daily as needed (irritation).), Disp: 160 g, Rfl: 2   zinc gluconate 50 MG tablet, Take 50 mg by mouth daily., Disp: , Rfl:    Cardiovascular and other pertinent studies:  EKG 10/16/2022: Sinus bradycardia 42 bpm  Nonspecific ST-T abnormality  Echocardiogram 06/05/2022:  Normal LV systolic function with visual EF 60-65%. Left ventricle cavity  is normal in size. Normal left ventricular wall thickness. Normal global  wall motion. Unable to evaluate diastolic function due to suboptimal image  acquisition.  Left atrial cavity is mildly dilated at 36.84 ml/m^2.  No significant valvular heart disease.  Proximal ascending aorta (34mm) measure within normal limits.  Compared to 01/15/2021 moderate LAE is now mild, proximal As Ao dilatation  and moderate PHTN are not appreciated on current study.   Mobile cardiac telemetry 14 days 01/22/2022 - 02/05/2022: Dominant rhythm: Sinus. HR 37-71 bpm. Avg HR  51 bpm, in sinus rhythm. 28 episodes of atrial tachycardia, fastest at 146 bpm for 11 beats, longest for 11 secs at 113 bpm. <1% isolated SVE, couplet/triplets. 1 episode of VT, at 121 bpm for 6 beats. <1% isolated VE, couplets. No atrial fibrillation/atrial flutter/high grade AV block, sinus pause >3sec noted. 0 patient triggered events.    Mobile cardiac telemetry 13 days 04/25/2021 - 05/09/2021: Dominant rhythm: Sinus. HR 36-152 bpm. Avg HR 52 bpm. 52 episodes of atrial tachycardia/SVT, fastest at 135 bpm for 13 beats, longest for 15.9 secs at 100 bpm. 2.6% isolated SVE, <1% couplet/triplets. 1 episode of VT, at 105 bpm for 5 beats <1% isolated VE, couplet/triplets. No atrial fibrillation/atrial flutter/high grade AV block, sinus pause >3sec noted. 0 patient triggered events.    MRI brain 06/08/2021: Brain: A 7 mm acute infarct is present in the left paramedian pons. A separate, smaller chronic infarct is noted more  laterally in the pons on the left. T2 hyperintensities in the cerebral white matter bilaterally are nonspecific but compatible with minimal chronic small vessel ischemic disease, not advanced for age. No intracranial hemorrhage, mass, midline shift, or extra-axial fluid collection is identified. There is no abnormal intracranial enhancement. There is mild generalized cerebral atrophy which is within normal limits for age.   Vascular: Major intracranial vascular flow voids are preserved.   Skull and upper cervical spine: Unremarkable bone marrow signal.   Sinuses/Orbits: Bilateral cataract extraction. Clear paranasal sinuses. Trace right mastoid fluid.   Other: None.   IMPRESSION: Small acute and chronic pontine infarcts.  CTA head/neck 06/08/2021: 1. Negative CTA for large vessel occlusion or other emergent finding. 2. Moderate atheromatous disease involving the intracranial posterior circulation without correctable stenosis. The left PCA is severely  attenuated and only faintly visible, but remains grossly patent to its distal aspect. Predominant fetal type origin of the right PCA. 3. Wide patency of the major arterial vasculature of the neck.      Lexiscan Tetrofosmin stress test 01/31/2021: Lexiscan nuclear stress test performed using 1-day protocol. SPECT images show decreased tracer uptake in inferior myocardium, more prominent at rest. In absence of myocardial thickening or wall motion abnormality, this likely represents attenuation artifact. Stress LVEF 52%. Low risk study.  Echocardiogram 01/15/2021:  1. Left ventricular ejection fraction, by estimation, is 55 to 60%. The  left ventricle has normal function. The left ventricle has no regional  wall motion abnormalities. Left ventricular diastolic parameters are  consistent with Grade I diastolic dysfunction (impaired relaxation).   2. Right ventricular systolic function is normal. The right ventricular  size is normal. There is moderately elevated pulmonary artery systolic  pressure. The estimated right ventricular systolic pressure is 47.2 mmHg.   3. Left atrial size was moderately dilated.   4. The mitral valve is normal in structure. Trivial mitral valve  regurgitation. No evidence of mitral stenosis.   5. The aortic valve is normal in structure. Aortic valve regurgitation is  trivial. No aortic stenosis is present.   6. Aortic dilatation noted. There is mild dilatation of the ascending  aorta, measuring 40 mm.   7. The inferior vena cava is normal in size with greater than 50%  respiratory variability, suggesting right atrial pressure of 3 mmHg.   Comparison(s): Changes from prior study are noted. 05/09/12 EF 55-60%. PA  pressure .   Recent labs: Feb-June 2024: Glucose 89, BUN/Cr 19/0.94. EGFR >60. Na/K 138/3.8.  H/H 12/36. MCV 90. Platelets 171 Chol 119, TG 102, HDL 35, LDL 65  02/02/2022: TSH 2.3  11/28/2021: Glucose 87, BUN/Cr 18/0.76. EGFR 84. Na/K  137/3.7. Rest of the CMP normal H/H 13/38. MCV 92. Platelets 178  March-April 2023: HbA1C 5.0% Chol 157, TG 99, HDL 33, LDL 104 TSH 2.1 normal    Review of Systems  Cardiovascular:  Negative for chest pain, dyspnea on exertion, leg swelling, palpitations and syncope.  Musculoskeletal:  Positive for back pain.         Vitals:   10/16/22 1349  BP: 137/71  Pulse: (!) 43  Resp: 16  SpO2: 97%      Body mass index is 29.73 kg/m. Filed Weights   10/16/22 1349  Weight: 219 lb 3.2 oz (99.4 kg)      Objective:   Physical Exam Vitals and nursing note reviewed.  Constitutional:      General: He is not in acute distress. Neck:     Vascular: No JVD.  Cardiovascular:     Rate and Rhythm: Regular rhythm. Bradycardia present.     Heart sounds: Normal heart sounds. No murmur heard. Pulmonary:     Effort: Pulmonary effort is normal.     Breath sounds: Normal breath sounds. No wheezing or rales.  Musculoskeletal:     Right lower leg: No edema.     Left lower leg: No edema.    Visit diagnoses:    ICD-10-CM   1. Paroxysmal atrial fibrillation (HCC)  I48.0     2. Sinus bradycardia  R00.1     3. Mixed hyperlipidemia  E78.2     4. Essential hypertension  I10             Assessment & Recommendations:    82 y.o. Caucasian male with hypertension, OSA, remote h/o Afib, pontine stroke (05/2021), pulmonary hypertension  Sinus bradycardia: Resting HR in low 40s without any symptoms. Normal TSH, not on AV nodal blocking agents.  With PAF, I suspect he may have sinus node dysfunction. He may require pacemaker at some point in his lifetime. However, in absence of symptoms, no indication of pacemaker at this time. Recommend serial EKG monitoring.  Stroke: Small acute on chronic pontine infarcts discovered in March 2023. Known remote history of A-fib.  Even though cardiac telemetry (2023) did not show A-fib, most likely etiology of his multifocal stroke is paroxysmal  A-fib. High CHA2DS2VASc score.  Continue Eliquis 5 mg twice daily. Tolerating pravastatin and Zetia with lipids well controlled.   Pulmonary hypertension: Resolved on most recent echocardiogram (05/2022).  Hypertension: Well controlled.  F/u in 3 months    Elder Negus, MD Pager: [phone] Office: [phone]

## 2022-10-28 ENCOUNTER — Other Ambulatory Visit: Payer: Self-pay | Admitting: Internal Medicine

## 2022-11-12 ENCOUNTER — Encounter (INDEPENDENT_AMBULATORY_CARE_PROVIDER_SITE_OTHER): Payer: Self-pay

## 2022-11-19 ENCOUNTER — Ambulatory Visit (INDEPENDENT_AMBULATORY_CARE_PROVIDER_SITE_OTHER): Payer: PPO

## 2022-11-19 VITALS — Ht 72.0 in | Wt 210.0 lb

## 2022-11-19 DIAGNOSIS — Z0000 Encounter for general adult medical examination without abnormal findings: Secondary | ICD-10-CM | POA: Diagnosis not present

## 2022-11-19 NOTE — Progress Notes (Addendum)
Subjective:   Vernon Bishop is a 82 y.o. male who presents for Medicare Annual/Subsequent preventive examination.  Visit Complete: Virtual  I connected with  Vernon Bishop on 11/19/22 by a audio enabled telemedicine application and verified that I am speaking with the correct person using two identifiers.  Patient Location: Home  Provider Location: Office/Clinic  I discussed the limitations of evaluation and management by telemedicine. The patient expressed understanding and agreed to proceed.  Vital Signs: Because this visit was a virtual/telehealth visit, some criteria may be missing or patient reported. Any vitals not documented were not able to be obtained and vitals that have been documented are patient reported.    Review of Systems     Cardiac Risk Factors include: advanced age (>6men, >36 women);dyslipidemia;family history of premature cardiovascular disease;hypertension;male gender     Objective:    Today's Vitals   11/19/22 0918  Weight: 210 lb (95.3 kg)  Height: 6' (1.829 m)  PainSc: 0-No pain   Body mass index is 28.48 kg/m.     11/19/2022    9:20 AM 09/08/2022    2:12 PM 04/22/2022    2:34 PM 02/02/2022    2:42 PM 11/24/2021    9:17 AM 06/25/2021   10:00 AM 06/11/2021    8:57 AM  Advanced Directives  Does Patient Have a Medical Advance Directive? Yes Yes No Yes Yes Yes Yes  Type of Estate agent of [place];Living will Healthcare Power of [place];Living will  Healthcare Power of [place];Living will;Out of facility DNR (pink MOST or yellow form) Healthcare Power of [place];Living will Healthcare Power of [place];Living will;Out of facility DNR (pink MOST or yellow form) Healthcare Power of [place];Living will  Does patient want to make changes to medical advance directive?       No - Patient declined  Copy of Healthcare Power of Attorney in Chart? No - copy requested No - copy requested   No - copy requested  No - copy requested   Would patient like information on creating a medical advance directive?   No - Patient declined        Current Medications (verified) Outpatient Encounter Medications as of 11/19/2022  Medication Sig   acetaminophen (TYLENOL) 325 MG tablet Take 325-650 mg by mouth every 6 (six) hours as needed for mild pain or headache.   amLODipine (NORVASC) 5 MG tablet Take 1 tablet (5 mg total) by mouth daily.   Ascorbic Acid (C-500/ROSE HIPS PO) Take by mouth.   chlorhexidine (HIBICLENS) 4 % external liquid Apply topically daily as needed. Use 1-2 times in the shower   Cholecalciferol (VITAMIN D3 PO) Take 1 tablet by mouth daily.   clotrimazole-betamethasone (LOTRISONE) cream Apply 1 Application topically 2 (two) times daily.   Cyanocobalamin (VITAMIN B-12 CR) 1000 MCG TBCR Take 1,000 mcg by mouth daily.     ELIQUIS 5 MG TABS tablet TAKE 1 TABLET BY MOUTH 2 TIMES DAILY.   ezetimibe (ZETIA) 10 MG tablet Take 1 tablet (10 mg total) by mouth daily.   FLUoxetine (PROZAC) 40 MG capsule TAKE 1 CAPSULE (40 MG TOTAL) BY MOUTH DAILY.   furosemide (LASIX) 20 MG tablet TAKE 1 TABLET (20 MG TOTAL) BY MOUTH DAILY.   gabapentin (NEURONTIN) 300 MG capsule Take 1 capsule (300 mg total) by mouth 3 (three) times daily.   methocarbamol (ROBAXIN) 500 MG tablet Take 1 tablet (500 mg total) by mouth every 6 (six) hours as needed for muscle spasms.   Multiple Vitamin (MULTIVITAMIN  WITH MINERALS) TABS tablet Take 1 tablet by mouth daily.   mupirocin ointment (BACTROBAN) 2 % On leg wound w/dressing change qd or bid   olmesartan-hydrochlorothiazide (BENICAR HCT) 20-12.5 MG tablet Take 1 tablet by mouth daily.   oxyCODONE-acetaminophen (PERCOCET) 10-325 MG tablet Take 1 tablet by mouth every 6 (six) hours as needed for pain. (Patient taking differently: Take 1 tablet by mouth in the morning, at noon, and at bedtime.)   PRESCRIPTION MEDICATION CPAP- At bedtime   propranolol ER (INDERAL LA) 80 MG 24 hr capsule Take 1 capsule (80 mg  total) by mouth daily.   Saw Palmetto, Serenoa repens, (SAW PALMETTO PO) Take 1 capsule by mouth daily.   sodium chloride (OCEAN) 0.65 % SOLN nasal spray Place 1 spray into both nostrils as needed for congestion.   tetrahydrozoline 0.05 % ophthalmic solution Place 1 drop into both eyes 2 (two) times daily as needed (for redness).   traZODone (DESYREL) 50 MG tablet Take 1-2 tablets (50-100 mg total) by mouth at bedtime.   triamcinolone ointment (KENALOG) 0.1 % Apply 1 Application topically 4 (four) times daily. (Patient taking differently: Apply 1 Application topically daily as needed (irritation).)   zinc gluconate 50 MG tablet Take 50 mg by mouth daily.   No facility-administered encounter medications on file as of 11/19/2022.    Allergies (verified) Abilify [aripiprazole], Cymbalta [duloxetine hcl], Digoxin and related, Prednisolone, Temazepam, Zanaflex [tizanidine], and Other   History: Past Medical History:  Diagnosis Date   Arthralgia    Atrial fibrillation (HCC)    1992   Benign prostatic hyperplasia    Bradycardia    "my doctor says I have bradycardia, my heart rate is always in the 40s"   DDD (degenerative disc disease)    Depression    Dysrhythmia    aFIB   Dyssomnia    ED (erectile dysfunction)    Hyperlipidemia    Hypertension    Hypogonadism male    Joint pain    LBP (low back pain)    Left pontine stroke (HCC)    numbness on tongue and occasional slurred speech   Multiple actinic keratoses    Muscle weakness    Upper limb   MVP (mitral valve prolapse)    Neck pain    OSA on CPAP    cpap   Osteoarthritis of hip    Osteoarthritis, hand    SEVERE LOWER BACK PAIN, AND RESTLESS LEG SYNDROME   Otalgia of right ear    Pain in left knee    Pain in thoracic spine    Palpitations    PTSD (post-traumatic stress disorder)    Shoulder pain    Subjective visual disturbance    Swallowing problem    HX OF PILLS GETTING "STUCK" IN THROAT AT TIMES   Swelling     Vitamin D deficiency    Past Surgical History:  Procedure Laterality Date   ABDOMINAL EXPOSURE N/A 03/12/2021   Procedure: ABDOMINAL EXPOSURE;  Surgeon: Cephus Shelling, MD;  Location: [facility] OR;  Service: Vascular;  Laterality: N/A;   ANTERIOR LAT LUMBAR FUSION N/A 03/12/2021   Procedure: ANTERIOR LATERAL LUMBAR FUSION 2 LEVELS;  Surgeon: Venita Lick, MD;  Location: [facility] OR;  Service: Orthopedics;  Laterality: N/A;   APPENDECTOMY     BACK SURGERY  1987   CARDIAC CATHETERIZATION     Many Years Ago. Lived in [place]   CATARACT EXTRACTION     CERVICAL LAMINECTOMY  2004   [name], rod was placed-  HAS ROM LIMITATIONS   CHOLECYSTECTOMY     COLONOSCOPY     KNEE SURGERY     left knee/arthroscopic   LUMBAR LAMINECTOMY/DECOMPRESSION MICRODISCECTOMY N/A 09/11/2022   Procedure: Laminectomy and Foraminotomy - Thoracic eleven -Twelve- Lumbar one-Lumbar two;  Surgeon: Arman Bogus, MD;  Location: [facility] OR;  Service: Neurosurgery;  Laterality: N/A;   TOTAL HIP ARTHROPLASTY Right 03/14/2013   Procedure: RIGHT TOTAL HIP ARTHROPLASTY ANTERIOR APPROACH;  Surgeon: Shelda Pal, MD;  Location: [facility] ORS;  Service: Orthopedics;  Laterality: Right;   TOTAL HIP ARTHROPLASTY Left 05/23/2019   Procedure: TOTAL HIP ARTHROPLASTY ANTERIOR APPROACH;  Surgeon: Durene Romans, MD;  Location: [facility] ORS;  Service: Orthopedics;  Laterality: Left;  70 mins   Family History  Problem Relation Age of Onset   Allergies Mother    Stroke Mother        Clotting disorders   Heart disease Mother    Heart disease Father    Heart attack Father 95   Heart disease Brother    Rheum arthritis Paternal Grandmother    Other Paternal Grandmother        Rheumatism   Coronary artery disease Other    Diabetes Other    Hypertension Other    Esophageal cancer Maternal Aunt    Colon cancer Neg Hx    Rectal cancer Neg Hx    Stomach cancer Neg Hx    Social History   Socioeconomic History   Marital status: Married    Spouse name: Not  on file   Number of children: 4   Years of education: Not on file   Highest education level: Not on file  Occupational History   Occupation: Retired-    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  Tobacco Use   Smoking status: Former    Current packs/day: 0.00    Average packs/day: 0.5 packs/day for 12.0 years (6.0 ttl pk-yrs)    Types: Cigarettes    Start date: 03/31/1959    Quit date: 03/31/1971    Years since quitting: 51.6   Smokeless tobacco: Never   Tobacco comments:    Started at age 61  Vaping Use   Vaping status: Never Used  Substance and Sexual Activity   Alcohol use: Not Currently    Comment: occasionally   Drug use: No   Sexual activity: Yes  Other Topics Concern   Not on file  Social History Narrative   FAMILY HISTORYHistory of CAD Male 1st degree relative <50History Diabetes 1st degree relativeHistory hypertension      Right Handed    Lives in a one story home    Social Determinants of Health   Financial Resource Strain: Low Risk  (11/19/2022)   Overall Financial Resource Strain (CARDIA)    Difficulty of Paying Living Expenses: Not hard at all  Food Insecurity: No Food Insecurity (11/19/2022)   Hunger Vital Sign    Worried About Running Out of Food in the Last Year: Never true    Ran Out of Food in the Last Year: Never true  Transportation Needs: No Transportation Needs (11/19/2022)   PRAPARE - Transportation    Lack of Transportation (Medical): No    Lack of Transportation (Non-Medical): No  Physical Activity: Inactive (11/19/2022)   Exercise Vital Sign    Days of Exercise per Week: 0 days    Minutes of Exercise per Session: 0 min  Stress: No Stress Concern Present (11/19/2022)   [employer] of Occupational Health - Occupational Stress  Questionnaire    Feeling of Stress : Not at all  Social Connections: Moderately Isolated (11/19/2022)   Social Connection and Isolation Panel [NHANES]    Frequency of Communication with Friends and Family: More than  three times a week    Frequency of Social Gatherings with Friends and Family: More than three times a week    Attends Religious Services: Never    Database administrator or Organizations: No    Attends Engineer, structural: Never    Marital Status: Married    Tobacco Counseling Counseling given: Not Answered Tobacco comments: Started at age 67   Clinical Intake:  Pre-visit preparation completed: Yes  Pain : No/denies pain Pain Score: 0-No pain     BMI - recorded: 28.48 Nutritional Status: BMI 25 -29 Overweight Nutritional Risks: None Diabetes: No  How often do you need to have someone help you when you read instructions, pamphlets, or other written materials from your doctor or pharmacy?: 1 - Never What is the last grade level you completed in school?: 10th grade  Interpreter Needed?: No  Information entered by :: Tomie Elko N. Ayat Drenning, LPN.   Activities of Daily Living    11/19/2022    9:23 AM 09/08/2022    2:14 PM  In your present state of health, do you have any difficulty performing the following activities:  Hearing? 0   Vision? 0   Difficulty concentrating or making decisions? 0   Walking or climbing stairs? 0   Dressing or bathing? 0   Doing errands, shopping? 0 0  Preparing Food and eating ? N   Using the Toilet? N   In the past six months, have you accidently leaked urine? N   Do you have problems with loss of bowel control? N   Managing your Medications? N   Managing your Finances? N   Housekeeping or managing your Housekeeping? N     Patient Care Team: [name], Georgina Quint, MD as PCP - General (Internal Medicine) Durene Romans, MD as Consulting Physician (Orthopedic Surgery) Archer Asa, MD as Consulting Physician (Psychiatry) Venita Lick, MD as Consulting Physician (Orthopedic Surgery) [facility] as Consulting Physician (Ophthalmology) Elder Negus, MD as Consulting Physician  (Cardiology) Glendale Chard, DO as Consulting Physician (Neurology) Augustin Schooling, MD as Consulting Physician (Ophthalmology)  Indicate any recent Medical Services you may have received from other than [facility] providers in the past year (date may be approximate).     Assessment:   This is a routine wellness examination for [facility].  Hearing/Vision screen Hearing Screening - Comments:: Patient denied any hearing difficulty.   No hearing aids.  Vision Screening - Comments:: Patient does wear otc readers.  Eye exam done by: Fredderick Phenix, MD.   Dietary issues and exercise activities discussed:     Goals Addressed             This Visit's Progress    Patient Stated: to weigh under 200 pounds and to maintain my health.        Depression Screen    11/19/2022    9:22 AM 09/10/2022    9:30 AM 06/09/2022    3:23 PM 03/31/2022   10:54 AM 11/28/2021    2:36 PM 11/24/2021    9:09 AM 09/03/2021    2:14 PM  PHQ 2/9 Scores  PHQ - 2 Score 0 0 0 0 3 0 4  PHQ- 9 Score 0   2 16  7  Fall Risk    11/19/2022    9:22 AM 09/10/2022    9:30 AM 06/09/2022    3:22 PM 03/31/2022   10:54 AM 02/02/2022    2:42 PM  Fall Risk   Falls in the past year? 0 0 1 0 0  Number falls in past yr: 0 0 1 0 0  Injury with Fall? 0 0 1 0 0  Risk for fall due to : No Fall Risks No Fall Risks History of fall(s) No Fall Risks   Follow up Falls prevention discussed Falls evaluation completed Falls evaluation completed Falls evaluation completed Falls evaluation completed    MEDICARE RISK AT HOME: Medicare Risk at Home Any stairs in or around the home?: No If so, are there any without handrails?: No Home free of loose throw rugs in walkways, pet beds, electrical cords, etc?: Yes Adequate lighting in your home to reduce risk of falls?: Yes Life alert?: No Use of a cane, walker or w/c?: Yes Grab bars in the bathroom?: Yes Shower chair or bench in shower?: Yes Elevated toilet seat or a handicapped toilet?: Yes  TIMED UP  AND GO:  Was the test performed?  No    Cognitive Function:    04/02/2017    4:01 PM  MMSE - Mini Mental State Exam  Not completed: Unable to complete  Orientation to time 5  Orientation to Place 5        11/19/2022    9:29 AM 11/24/2021    9:14 AM  6CIT Screen  What Year? 0 points 0 points  What month? 0 points 0 points  What time? 0 points 0 points  Count back from 20 0 points 0 points  Months in reverse 0 points 0 points  Repeat phrase 0 points 0 points  Total Score 0 points 0 points    Immunizations Immunization History  Administered Date(s) Administered   Fluad Quad(high Dose 65+) 11/22/2018, 12/20/2019   Influenza Split 01/28/2011, 12/18/2011   Influenza Whole 02/14/2007, 12/11/2009   Influenza, High Dose Seasonal PF 02/03/2016, 12/30/2016, 01/04/2018, 12/09/2020   Influenza,inj,Quad PF,6+ Mos 01/03/2013, 01/10/2014, 12/05/2014   PFIZER(Purple Top)SARS-COV-2 Vaccination 04/19/2019, 05/10/2019, 12/24/2019, 07/05/2020   PNEUMOCOCCAL CONJUGATE-20 08/30/2020   Pfizer Covid-19 Vaccine Bivalent Booster 26yrs & up 12/09/2020   Pneumococcal Conjugate-13 05/08/2013   Pneumococcal Polysaccharide-23 08/27/2009   Td 03/30/2008   Td (Adult), 2 Lf Tetanus Toxid, Preservative Free 03/30/2008   Zoster, Live 04/13/2014    TDAP status: Due, Education has been provided regarding the importance of this vaccine. Advised may receive this vaccine at local pharmacy or Health Dept. Aware to provide a copy of the vaccination record if obtained from local pharmacy or Health Dept. Verbalized acceptance and understanding.  Flu Vaccine status: Due, Education has been provided regarding the importance of this vaccine. Advised may receive this vaccine at local pharmacy or Health Dept. Aware to provide a copy of the vaccination record if obtained from local pharmacy or Health Dept. Verbalized acceptance and understanding.  Pneumococcal vaccine status: Up to date  Covid-19 vaccine status:  Completed vaccines  Qualifies for Shingles Vaccine? Yes   Zostavax completed Yes   Shingrix Completed?: No.    Education has been provided regarding the importance of this vaccine. Patient has been advised to call insurance company to determine out of pocket expense if they have not yet received this vaccine. Advised may also receive vaccine at local pharmacy or Health Dept. Verbalized acceptance and understanding.  Screening Tests Health Maintenance  Topic Date Due   OPHTHALMOLOGY EXAM  12/28/2017   DTaP/Tdap/Td (3 - Tdap) 03/30/2018   FOOT EXAM  09/20/2019   Diabetic kidney evaluation - Urine ACR  12/19/2020   COVID-19 Vaccine (6 - 2023-24 season) 11/28/2021   HEMOGLOBIN A1C  12/10/2021   INFLUENZA VACCINE  10/29/2022   Diabetic kidney evaluation - eGFR measurement  09/08/2023   Medicare Annual Wellness (AWV)  11/19/2023   Pneumonia Vaccine 73+ Years old  Completed   HPV VACCINES  Aged Out   Zoster Vaccines- Shingrix  Discontinued    Health Maintenance  Health Maintenance Due  Topic Date Due   OPHTHALMOLOGY EXAM  12/28/2017   DTaP/Tdap/Td (3 - Tdap) 03/30/2018   FOOT EXAM  09/20/2019   Diabetic kidney evaluation - Urine ACR  12/19/2020   COVID-19 Vaccine (6 - 2023-24 season) 11/28/2021   HEMOGLOBIN A1C  12/10/2021   INFLUENZA VACCINE  10/29/2022    Colorectal cancer screening: No longer required.   Lung Cancer Screening: (Low Dose CT Chest recommended if Age 61-80 years, 20 pack-year currently smoking OR have quit w/in 15years.) does not qualify.   Lung Cancer Screening Referral: no  Additional Screening:  Hepatitis C Screening: does not qualify.  Vision Screening: Recommended annual ophthalmology exams for early detection of glaucoma and other disorders of the eye. Is the patient up to date with their annual eye exam?  Yes  Who is the provider or what is the name of the office in which the patient attends annual eye exams? Fredderick Phenix, MD. If pt is not established  with a provider, would they like to be referred to a provider to establish care? No .   Dental Screening: Recommended annual dental exams for proper oral hygiene  Diabetic Foot Exam: N/A  Community Resource Referral / Chronic Care Management: CRR required this visit?  No   CCM required this visit?  No     Plan:     I have personally reviewed and noted the following in the patient's chart:   Medical and social history Use of alcohol, tobacco or illicit drugs  Current medications and supplements including opioid prescriptions. Patient is currently taking opioid prescriptions. Information provided to patient regarding non-opioid alternatives. Patient advised to discuss non-opioid treatment plan with their provider. Functional ability and status Nutritional status Physical activity Advanced directives List of other physicians Hospitalizations, surgeries, and ER visits in previous 12 months Vitals Screenings to include cognitive, depression, and falls Referrals and appointments  In addition, I have reviewed and discussed with patient certain preventive protocols, quality metrics, and best practice recommendations. A written personalized care plan for preventive services as well as general preventive health recommendations were provided to patient.     Mickeal Needy, LPN   9/56/2130   After Visit Summary: (Mail) Due to this being a telephonic visit, the after visit summary with patients personalized plan was offered to patient via mail   Nurse Notes: Normal cognitive status assessed by direct observation via telephone conversation by this Nurse Health Advisor. No abnormalities found.   Medical screening examination/treatment/procedure(s) were performed by non-physician practitioner and as supervising physician I was immediately available for consultation/collaboration.  I agree with above. Jacinta Shoe, MD

## 2022-11-19 NOTE — Patient Instructions (Signed)
Vernon Bishop , Thank you for taking time to come for your Medicare Wellness Visit. I appreciate your ongoing commitment to your health goals. Please review the following plan we discussed and let me know if I can assist you in the future.   Referrals/Orders/Follow-Ups/Clinician Recommendations: NO  This is a list of the screening recommended for you and due dates:  Health Maintenance  Topic Date Due   Eye exam for diabetics  12/28/2017   DTaP/Tdap/Td vaccine (3 - Tdap) 03/30/2018   Complete foot exam   09/20/2019   Yearly kidney health urinalysis for diabetes  12/19/2020   COVID-19 Vaccine (6 - 2023-24 season) 11/28/2021   Hemoglobin A1C  12/10/2021   Flu Shot  10/29/2022   Yearly kidney function blood test for diabetes  09/08/2023   Medicare Annual Wellness Visit  11/19/2023   Pneumonia Vaccine  Completed   HPV Vaccine  Aged Out   Zoster (Shingles) Vaccine  Discontinued    Advanced directives: (Copy Requested) Please bring a copy of your health care power of attorney and living will to the office to be added to your chart at your convenience.  Next Medicare Annual Wellness Visit scheduled for next year: Yes  Preventive Care 76 Years and Older, Male: Preventive care refers to lifestyle choices and visits with your health care provider that can promote health and wellness. Preventive care visits are also called wellness exams. What can I expect for my preventive care visit? Counseling During your preventive care visit, your health care provider may ask about your: Medical history, including: Past medical problems. Family medical history. History of falls. Current health, including: Emotional well-being. Home life and relationship well-being. Sexual activity. Memory and ability to understand (cognition). Lifestyle, including: Alcohol, nicotine or tobacco, and drug use. Access to firearms. Diet, exercise, and sleep habits. Work and work Astronomer. Sunscreen use. Safety  issues such as seatbelt and bike helmet use. Physical exam Your health care provider will check your: Height and weight. These may be used to calculate your BMI (body mass index). BMI is a measurement that tells if you are at a healthy weight. Waist circumference. This measures the distance around your waistline. This measurement also tells if you are at a healthy weight and may help predict your risk of certain diseases, such as type 2 diabetes and high blood pressure. Heart rate and blood pressure. Body temperature. Skin for abnormal spots. What immunizations do I need?  Vaccines are usually given at various ages, according to a schedule. Your health care provider will recommend vaccines for you based on your age, medical history, and lifestyle or other factors, such as travel or where you work. What tests do I need? Screening Your health care provider may recommend screening tests for certain conditions. This may include: Lipid and cholesterol levels. Diabetes screening. This is done by checking your blood sugar (glucose) after you have not eaten for a while (fasting). Hepatitis C test. Hepatitis B test. HIV (human immunodeficiency virus) test. STI (sexually transmitted infection) testing, if you are at risk. Lung cancer screening. Colorectal cancer screening. Prostate cancer screening. Abdominal aortic aneurysm (AAA) screening. You may need this if you are a current or former smoker. Talk with your health care provider about your test results, treatment options, and if necessary, the need for more tests. Follow these instructions at home: Eating and drinking  Eat a diet that includes fresh fruits and vegetables, whole grains, lean protein, and low-fat dairy products. Limit your intake of foods with  high amounts of sugar, saturated fats, and salt. Take vitamin and mineral supplements as recommended by your health care provider. Do not drink alcohol if your health care provider tells  you not to drink. If you drink alcohol: Limit how much you have to 0-2 drinks a day. Know how much alcohol is in your drink. In the [place] one drink equals one 12 oz bottle of beer (355 mL), one 5 oz glass of wine (148 mL), or one 1 oz glass of hard liquor (44 mL). Lifestyle Brush your teeth every morning and night with fluoride toothpaste. Floss one time each day. Exercise for at least 30 minutes 5 or more days each week. Do not use any products that contain nicotine or tobacco. These products include cigarettes, chewing tobacco, and vaping devices, such as e-cigarettes. If you need help quitting, ask your health care provider. Do not use drugs. If you are sexually active, practice safe sex. Use a condom or other form of protection to prevent STIs. Take aspirin only as told by your health care provider. Make sure that you understand how much to take and what form to take. Work with your health care provider to find out whether it is safe and beneficial for you to take aspirin daily. Ask your health care provider if you need to take a cholesterol-lowering medicine (statin). Find healthy ways to manage stress, such as: Meditation, yoga, or listening to music. Journaling. Talking to a trusted person. Spending time with friends and family. Safety Always wear your seat belt while driving or riding in a vehicle. Do not drive: If you have been drinking alcohol. Do not ride with someone who has been drinking. When you are tired or distracted. While texting. If you have been using any mind-altering substances or drugs. Wear a helmet and other protective equipment during sports activities. If you have firearms in your house, make sure you follow all gun safety procedures. Minimize exposure to UV radiation to reduce your risk of skin cancer. What's next? Visit your health care provider once a year for an annual wellness visit. Ask your health care provider how often you should have your eyes and  teeth checked. Stay up to date on all vaccines. This information is not intended to replace advice given to you by your health care provider. Make sure you discuss any questions you have with your health care provider. Document Revised: 09/11/2020 Document Reviewed: 09/11/2020 Elsevier Patient Education  2024 [employer].  Fall Prevention in the Home, Adult Falls can cause injuries and can happen to people of all ages. There are many things you can do to make your home safer and to help prevent falls. What actions can I take to prevent falls? General information Use good lighting in all rooms. Make sure to: Replace any light bulbs that burn out. Turn on the lights in dark areas and use night-lights. Keep items that you use often in easy-to-reach places. Lower the shelves around your home if needed. Move furniture so that there are clear paths around it. Do not use throw rugs or other things on the floor that can make you trip. If any of your floors are uneven, fix them. Add color or contrast paint or tape to clearly mark and help you see: Grab bars or handrails. First and last steps of staircases. Where the edge of each step is. If you use a ladder or stepladder: Make sure that it is fully opened. Do not climb a closed ladder. Make sure  the sides of the ladder are locked in place. Have someone hold the ladder while you use it. Know where your pets are as you move through your home. What can I do in the bathroom?     Keep the floor dry. Clean up any water on the floor right away. Remove soap buildup in the bathtub or shower. Buildup makes bathtubs and showers slippery. Use non-skid mats or decals on the floor of the bathtub or shower. Attach bath mats securely with double-sided, non-slip rug tape. If you need to sit down in the shower, use a non-slip stool. Install grab bars by the toilet and in the bathtub and shower. Do not use towel bars as grab bars. What can I do in the  bedroom? Make sure that you have a light by your bed that is easy to reach. Do not use any sheets or blankets on your bed that hang to the floor. Have a firm chair or bench with side arms that you can use for support when you get dressed. What can I do in the kitchen? Clean up any spills right away. If you need to reach something above you, use a step stool with a grab bar. Keep electrical cords out of the way. Do not use floor polish or wax that makes floors slippery. What can I do with my stairs? Do not leave anything on the stairs. Make sure that you have a light switch at the top and the bottom of the stairs. Make sure that there are handrails on both sides of the stairs. Fix handrails that are broken or loose. Install non-slip stair treads on all your stairs if they do not have carpet. Avoid having throw rugs at the top or bottom of the stairs. Choose a carpet that does not hide the edge of the steps on the stairs. Make sure that the carpet is firmly attached to the stairs. Fix carpet that is loose or worn. What can I do on the outside of my home? Use bright outdoor lighting. Fix the edges of walkways and driveways and fix any cracks. Clear paths of anything that can make you trip, such as tools or rocks. Add color or contrast paint or tape to clearly mark and help you see anything that might make you trip as you walk through a door, such as a raised step or threshold. Trim any bushes or trees on paths to your home. Check to see if handrails are loose or broken and that both sides of all steps have handrails. Install guardrails along the edges of any raised decks and porches. Have leaves, snow, or ice cleared regularly. Use sand, salt, or ice melter on paths if you live where there is ice and snow during the winter. Clean up any spills in your garage right away. This includes grease or oil spills. What other actions can I take? Review your medicines with your doctor. Some medicines can  cause dizziness or changes in blood pressure, which increase your risk of falling. Wear shoes that: Have a low heel. Do not wear high heels. Have rubber bottoms and are closed at the toe. Feel good on your feet and fit well. Use tools that help you move around if needed. These include: Canes. Walkers. Scooters. Crutches. Ask your doctor what else you can do to help prevent falls. This may include seeing a physical therapist to learn to do exercises to move better and get stronger. Where to find more information Centers for Disease Control  and Prevention, [employer]: TonerPromos.no [employer] on Aging: BaseRingTones.pl National Institute on Aging: BaseRingTones.pl Contact a doctor if: You are afraid of falling at home. You feel weak, drowsy, or dizzy at home. You fall at home. Get help right away if you: Lose consciousness or have trouble moving after a fall. Have a fall that causes a head injury. These symptoms may be an emergency. Get help right away. Call 911. Do not wait to see if the symptoms will go away. Do not drive yourself to the hospital. This information is not intended to replace advice given to you by your health care provider. Make sure you discuss any questions you have with your health care provider. Document Revised: 11/17/2021 Document Reviewed: 11/17/2021 Elsevier Patient Education  2024 [employer].

## 2022-11-20 DIAGNOSIS — R269 Unspecified abnormalities of gait and mobility: Secondary | ICD-10-CM | POA: Diagnosis not present

## 2022-11-20 DIAGNOSIS — R2689 Other abnormalities of gait and mobility: Secondary | ICD-10-CM | POA: Diagnosis not present

## 2022-12-03 ENCOUNTER — Other Ambulatory Visit: Payer: Self-pay | Admitting: Cardiology

## 2022-12-03 DIAGNOSIS — I480 Paroxysmal atrial fibrillation: Secondary | ICD-10-CM

## 2022-12-04 DIAGNOSIS — R2689 Other abnormalities of gait and mobility: Secondary | ICD-10-CM | POA: Diagnosis not present

## 2022-12-04 DIAGNOSIS — R269 Unspecified abnormalities of gait and mobility: Secondary | ICD-10-CM | POA: Diagnosis not present

## 2022-12-07 DIAGNOSIS — R269 Unspecified abnormalities of gait and mobility: Secondary | ICD-10-CM | POA: Diagnosis not present

## 2022-12-07 DIAGNOSIS — R2689 Other abnormalities of gait and mobility: Secondary | ICD-10-CM | POA: Diagnosis not present

## 2022-12-09 DIAGNOSIS — R2689 Other abnormalities of gait and mobility: Secondary | ICD-10-CM | POA: Diagnosis not present

## 2022-12-09 DIAGNOSIS — R269 Unspecified abnormalities of gait and mobility: Secondary | ICD-10-CM | POA: Diagnosis not present

## 2022-12-11 ENCOUNTER — Other Ambulatory Visit: Payer: Self-pay | Admitting: Internal Medicine

## 2022-12-11 DIAGNOSIS — I10 Essential (primary) hypertension: Secondary | ICD-10-CM

## 2022-12-14 DIAGNOSIS — R2689 Other abnormalities of gait and mobility: Secondary | ICD-10-CM | POA: Diagnosis not present

## 2022-12-14 DIAGNOSIS — R269 Unspecified abnormalities of gait and mobility: Secondary | ICD-10-CM | POA: Diagnosis not present

## 2022-12-16 ENCOUNTER — Encounter: Payer: Self-pay | Admitting: Internal Medicine

## 2022-12-16 ENCOUNTER — Ambulatory Visit (INDEPENDENT_AMBULATORY_CARE_PROVIDER_SITE_OTHER): Payer: PPO | Admitting: Internal Medicine

## 2022-12-16 VITALS — BP 120/68 | HR 52 | Temp 97.8°F | Ht 72.0 in | Wt 215.0 lb

## 2022-12-16 DIAGNOSIS — E538 Deficiency of other specified B group vitamins: Secondary | ICD-10-CM

## 2022-12-16 DIAGNOSIS — E119 Type 2 diabetes mellitus without complications: Secondary | ICD-10-CM | POA: Diagnosis not present

## 2022-12-16 DIAGNOSIS — I10 Essential (primary) hypertension: Secondary | ICD-10-CM

## 2022-12-16 DIAGNOSIS — F339 Major depressive disorder, recurrent, unspecified: Secondary | ICD-10-CM

## 2022-12-16 DIAGNOSIS — I480 Paroxysmal atrial fibrillation: Secondary | ICD-10-CM

## 2022-12-16 DIAGNOSIS — I6340 Cerebral infarction due to embolism of unspecified cerebral artery: Secondary | ICD-10-CM | POA: Diagnosis not present

## 2022-12-16 DIAGNOSIS — R29898 Other symptoms and signs involving the musculoskeletal system: Secondary | ICD-10-CM | POA: Diagnosis not present

## 2022-12-16 DIAGNOSIS — E291 Testicular hypofunction: Secondary | ICD-10-CM

## 2022-12-16 NOTE — Progress Notes (Signed)
Subjective:  Patient ID: Vernon Bishop, male    DOB: Oct 25, 1940  Age: 82 y.o. MRN: [id number]  CC: Follow-up (3 MNTH F/U/)   HPI Vernon Bishop presents for LBP, HTN, wt loss  Outpatient Medications Prior to Visit  Medication Sig Dispense Refill   acetaminophen (TYLENOL) 325 MG tablet Take 325-650 mg by mouth every 6 (six) hours as needed for mild pain or headache.     amLODipine (NORVASC) 5 MG tablet Take 1 tablet (5 mg total) by mouth daily. 90 tablet 3   Ascorbic Acid (C-500/ROSE HIPS PO) Take by mouth.     chlorhexidine (HIBICLENS) 4 % external liquid Apply topically daily as needed. Use 1-2 times in the shower 500 mL 0   Cholecalciferol (VITAMIN D3 PO) Take 1 tablet by mouth daily.     clotrimazole-betamethasone (LOTRISONE) cream Apply 1 Application topically 2 (two) times daily. 45 g 1   Cyanocobalamin (VITAMIN B-12 CR) 1000 MCG TBCR Take 1,000 mcg by mouth daily.       ELIQUIS 5 MG TABS tablet TAKE 1 TABLET BY MOUTH 2 TIMES DAILY. 60 tablet 3   ezetimibe (ZETIA) 10 MG tablet Take 1 tablet (10 mg total) by mouth daily. 30 tablet 3   FLUoxetine (PROZAC) 40 MG capsule TAKE 1 CAPSULE (40 MG TOTAL) BY MOUTH DAILY. 90 capsule 2   furosemide (LASIX) 20 MG tablet TAKE 1 TABLET (20 MG TOTAL) BY MOUTH DAILY. 30 tablet 3   gabapentin (NEURONTIN) 300 MG capsule Take 1 capsule (300 mg total) by mouth 3 (three) times daily. 270 capsule 3   methocarbamol (ROBAXIN) 500 MG tablet Take 1 tablet (500 mg total) by mouth every 6 (six) hours as needed for muscle spasms. 30 tablet 1   Multiple Vitamin (MULTIVITAMIN WITH MINERALS) TABS tablet Take 1 tablet by mouth daily.     mupirocin ointment (BACTROBAN) 2 % On leg wound w/dressing change qd or bid 30 g 0   olmesartan-hydrochlorothiazide (BENICAR HCT) 20-12.5 MG tablet TAKE 1 TABLET BY MOUTH DAILY. 90 tablet 3   PRESCRIPTION MEDICATION CPAP- At bedtime     propranolol ER (INDERAL LA) 80 MG 24 hr capsule Take 1 capsule (80 mg total) by mouth daily.  90 capsule 3   Saw Palmetto, Serenoa repens, (SAW PALMETTO PO) Take 1 capsule by mouth daily.     sodium chloride (OCEAN) 0.65 % SOLN nasal spray Place 1 spray into both nostrils as needed for congestion.     tetrahydrozoline 0.05 % ophthalmic solution Place 1 drop into both eyes 2 (two) times daily as needed (for redness).     traZODone (DESYREL) 50 MG tablet Take 1-2 tablets (50-100 mg total) by mouth at bedtime. 60 tablet 5   triamcinolone ointment (KENALOG) 0.1 % Apply 1 Application topically 4 (four) times daily. (Patient taking differently: Apply 1 Application topically daily as needed (irritation).) 160 g 2   zinc gluconate 50 MG tablet Take 50 mg by mouth daily.     No facility-administered medications prior to visit.    ROS: Review of Systems  Constitutional:  Positive for fatigue. Negative for appetite change and unexpected weight change.  HENT:  Negative for congestion, nosebleeds, sneezing, sore throat and trouble swallowing.   Eyes:  Negative for itching and visual disturbance.  Respiratory:  Negative for cough.   Cardiovascular:  Negative for chest pain, palpitations and leg swelling.  Gastrointestinal:  Negative for abdominal distention, blood in stool, diarrhea and nausea.  Genitourinary:  Negative for frequency  and hematuria.  Musculoskeletal:  Positive for arthralgias, back pain and gait problem. Negative for joint swelling and neck pain.  Skin:  Negative for color change and rash.  Neurological:  Negative for dizziness, tremors, speech difficulty and weakness.  Psychiatric/Behavioral:  Positive for dysphoric mood. Negative for agitation, sleep disturbance and suicidal ideas. The patient is not nervous/anxious.     Objective:  BP 120/68 (BP Location: Left Arm, Patient Position: Sitting, Cuff Size: Large)   Pulse (!) 52   Temp 97.8 F (36.6 C) (Oral)   Ht 6' (1.829 m)   Wt 215 lb (97.5 kg)   SpO2 94%   BMI 29.16 kg/m   BP Readings from Last 3 Encounters:   12/16/22 120/68  10/16/22 137/71  09/12/22 (!) 135/55    Wt Readings from Last 3 Encounters:  12/16/22 215 lb (97.5 kg)  11/19/22 210 lb (95.3 kg)  10/16/22 219 lb 3.2 oz (99.4 kg)    Physical Exam Constitutional:      General: He is not in acute distress.    Appearance: He is well-developed. He is obese.     Comments: NAD  Eyes:     Conjunctiva/sclera: Conjunctivae normal.     Pupils: Pupils are equal, round, and reactive to light.  Neck:     Thyroid: No thyromegaly.     Vascular: No JVD.  Cardiovascular:     Rate and Rhythm: Regular rhythm. Bradycardia present.     Heart sounds: Murmur heard.     No friction rub. No gallop.  Pulmonary:     Effort: Pulmonary effort is normal. No respiratory distress.     Breath sounds: Normal breath sounds. No wheezing or rales.  Chest:     Chest wall: No tenderness.  Abdominal:     General: Bowel sounds are normal. There is no distension.     Palpations: Abdomen is soft. There is no mass.     Tenderness: There is no abdominal tenderness. There is no guarding or rebound.  Musculoskeletal:        General: Tenderness present. Normal range of motion.     Cervical back: Normal range of motion.     Right lower leg: No edema.     Left lower leg: No edema.  Lymphadenopathy:     Cervical: No cervical adenopathy.  Skin:    General: Skin is warm and dry.     Findings: No rash.  Neurological:     Mental Status: He is alert and oriented to person, place, and time.     Cranial Nerves: No cranial nerve deficit.     Motor: Weakness present. No abnormal muscle tone.     Coordination: Coordination abnormal.     Gait: Gait abnormal.     Deep Tendon Reflexes: Reflexes are normal and symmetric.  Psychiatric:        Behavior: Behavior normal.        Thought Content: Thought content normal.        Judgment: Judgment normal.   LS is stiff w/pain Antalgic gait Weak legs B Thenar muscles are atrophic  Lab Results  Component Value Date   WBC  5.5 09/08/2022   HGB 12.4 (L) 09/08/2022   HCT 36.9 (L) 09/08/2022   PLT 171 09/08/2022   GLUCOSE 89 09/08/2022   CHOL 119 05/20/2022   TRIG 102 05/20/2022   HDL 35 (L) 05/20/2022   LDLCALC 65 05/20/2022   ALT 18 04/22/2022   AST 21 04/22/2022   NA 138 09/08/2022  K 3.8 09/08/2022   CL 104 09/08/2022   CREATININE 0.94 09/08/2022   BUN 19 09/08/2022   CO2 23 09/08/2022   TSH 2.340 02/02/2022   PSA 0.73 12/20/2019   INR 1.2 04/22/2022   HGBA1C 5.0 06/09/2021   MICROALBUR 3.9 12/20/2019    DG Lumbar Spine 2-3 Views  Result Date: 09/11/2022 CLINICAL DATA:  Elective surgery. EXAM: LUMBAR SPINE - 2-3 VIEW COMPARISON:  Radiograph 08/25/2022 FINDINGS: Two portable cross-table lateral views of the lumbar spine obtained in the operating room. Image 1 demonstrates previous lumbar hardware in place with instrument localizing posterior to the L1 level. Image 2 demonstrates surgical instrument posterior to the L2 level. IMPRESSION: Intraoperative radiographs during lumbar spine surgery. Electronically Signed   By: Narda Rutherford M.D.   On: 09/11/2022 14:42    Assessment & Plan:   Problem List Items Addressed This Visit     Diabetes mellitus type 2, diet-controlled (HCC) - Primary     Pt lost wt on diet  before, stable wt now      Hypogonadism male    Chronic Off Testosterone inj       Vitamin B12 deficiency    Stable. Cont w/Vit B12      Depression    On Fluoxetine 40 mg, Trazodone at hs       Essential hypertension    Cont w/Lotensin HCT, Amlodipine, Inderal      Leg weakness, bilateral    In PT      Cerebrovascular accident (CVA) (HCC)    On Eliquis MRI - Chronic pontine infarcts.       Paroxysmal atrial fibrillation (HCC)    On Eliquis MRI - Chronic pontine infarcts.          No orders of the defined types were placed in this encounter.     Follow-up: Return in about 6 months (around 06/15/2023) for Wellness Exam.  Sonda Primes, MD

## 2022-12-16 NOTE — Assessment & Plan Note (Signed)
In PT

## 2022-12-16 NOTE — Assessment & Plan Note (Signed)
Cont w/Lotensin HCT, Amlodipine, Inderal

## 2022-12-16 NOTE — Assessment & Plan Note (Signed)
On Eliquis MRI - Chronic pontine infarcts.

## 2022-12-16 NOTE — Assessment & Plan Note (Signed)
Stable. Cont w/Vit B12

## 2022-12-16 NOTE — Assessment & Plan Note (Signed)
On Fluoxetine 40 mg, Trazodone at hs

## 2022-12-16 NOTE — Assessment & Plan Note (Addendum)
Chronic Off Testosterone inj

## 2022-12-16 NOTE — Assessment & Plan Note (Signed)
Pt lost wt on diet  before, stable wt now

## 2022-12-17 IMAGING — MR MR LUMBAR SPINE W/O CM
4 of 6 series · 19 of 48 positions shown · non-contrast
Comparison: Lumbar radiographs over 4, 5050.

CLINICAL DATA: Low back pain radiating down left leg for 1 year.

EXAM:
MRI LUMBAR SPINE WITHOUT CONTRAST
TECHNIQUE: Multiplanar, multisequence MR imaging of the lumbar spine was
performed. No intravenous contrast was administered.

[Series 7: T2 · sagittal · 4.0mm · 0.62mm/px · 8 of 24 slices shown (1 of 2)]
[im 1/24]
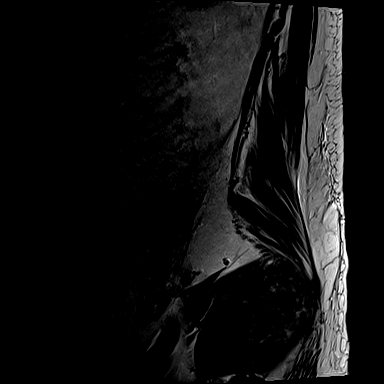
[im 4/24]
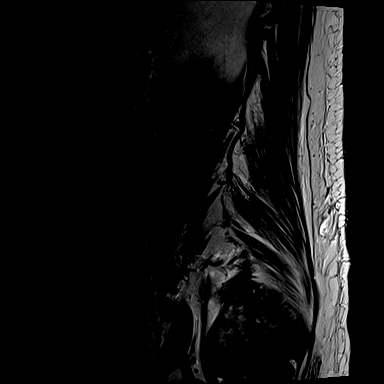
[im 7/24]
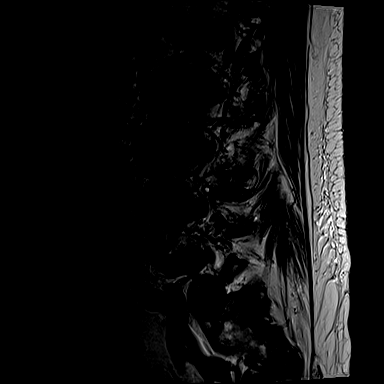
[im 10/24]
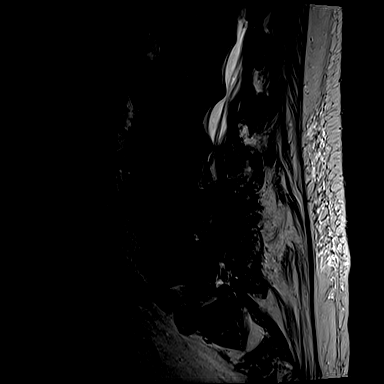
[im 14/24]
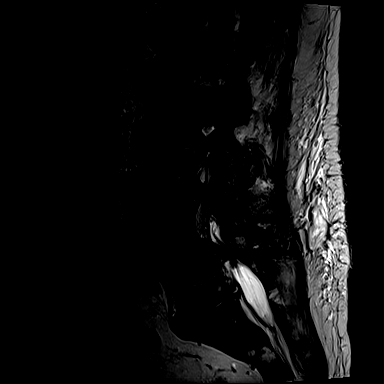
[im 17/24]
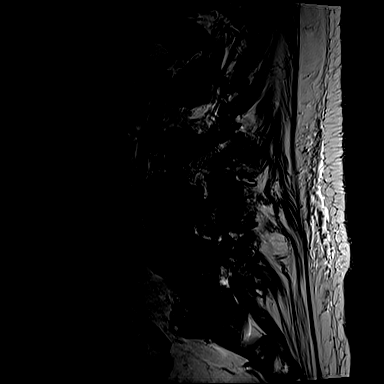
[im 20/24]
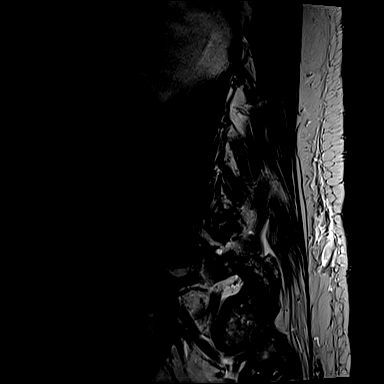
[im 24/24]
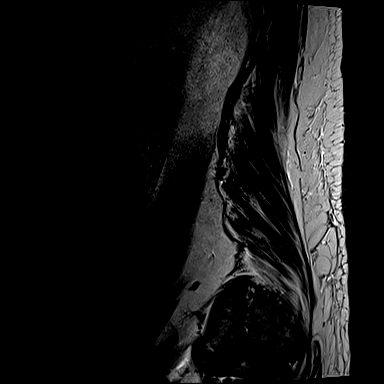

[Series 8: T1 · sagittal · 4.0mm · 0.62mm/px · 3 of 24 slices shown (1 of 2)]
[im 4/24]
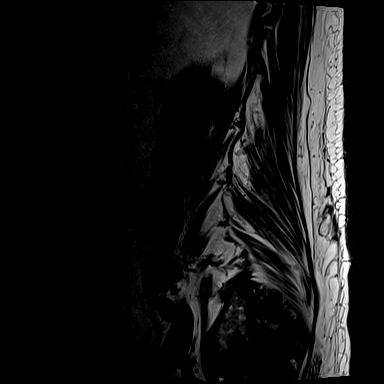
[im 12/24]
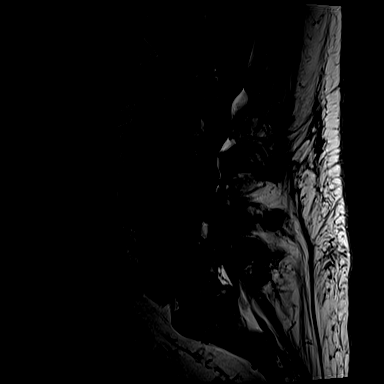
[im 20/24]
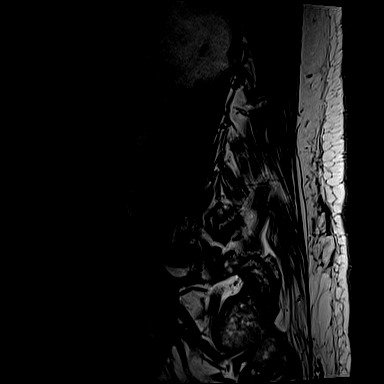

[Series 10: T1 · axial · 4.0mm · 0.28mm/px · z∈[+61,+136]mm · 3 of 19 slices shown (2 of 2)]
[im 4/19]
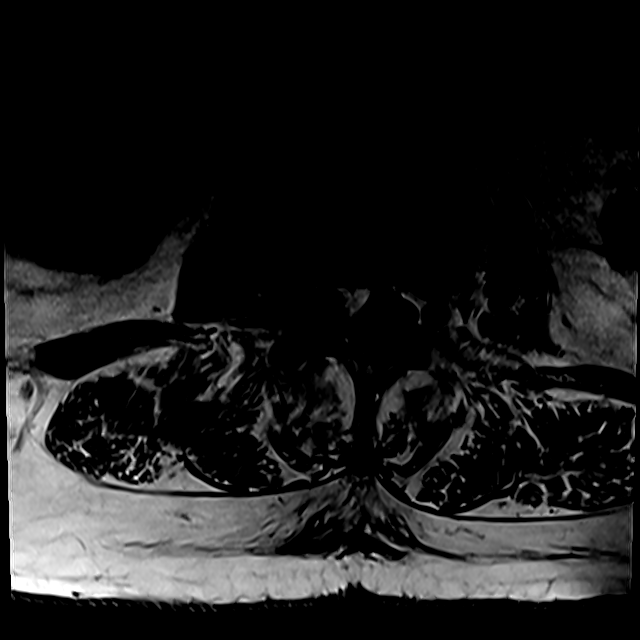
[im 11/19]
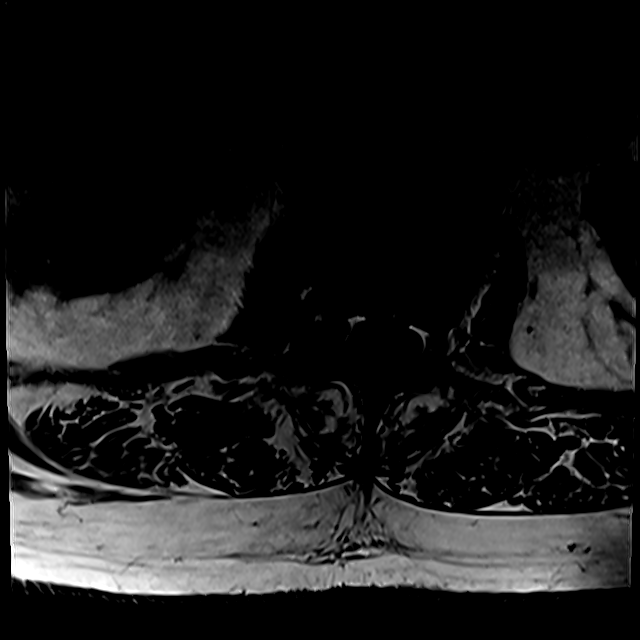
[im 19/19]
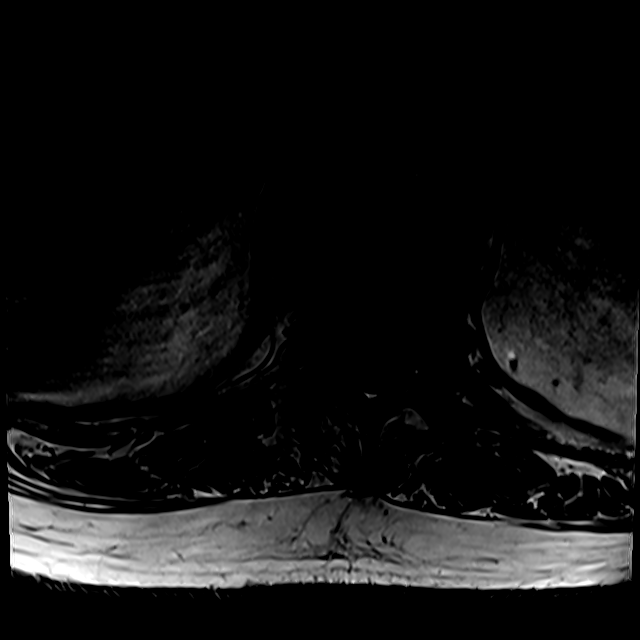

[Series 14: T2 · axial · 4.0mm · 0.28mm/px · z∈[-81,+101]mm · 5 of 41 slices shown (2 of 2)]
[im 1/41]
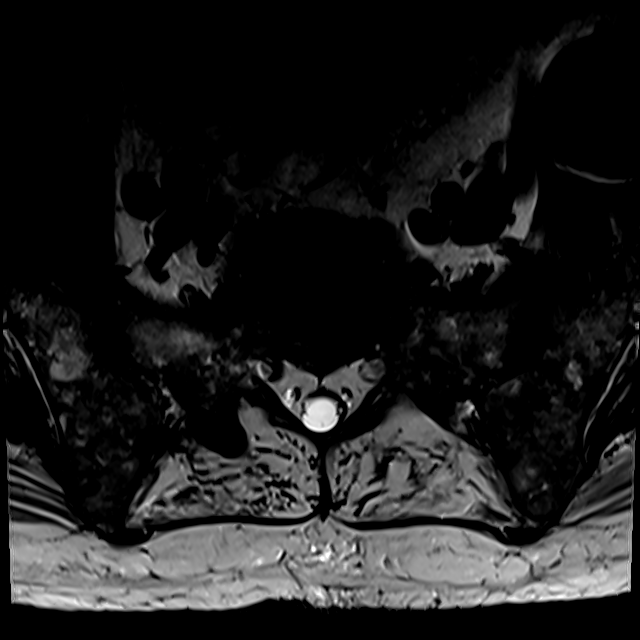
[im 7/41]
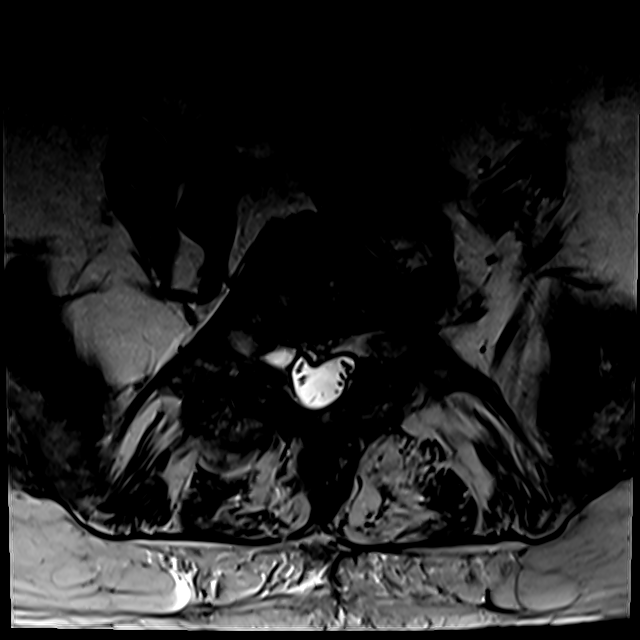
[im 14/41]
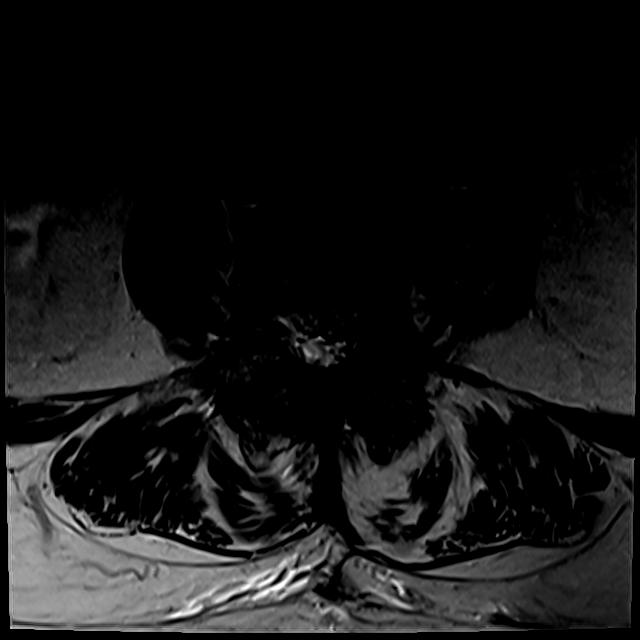
[im 21/41]
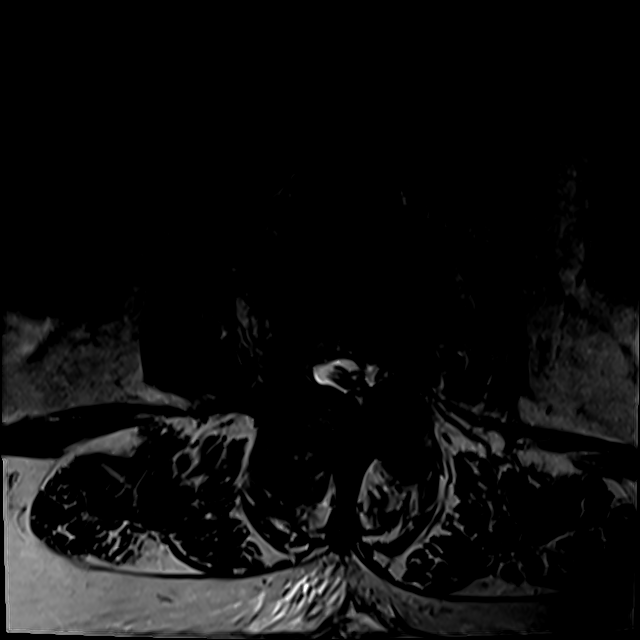
[im 34/41]
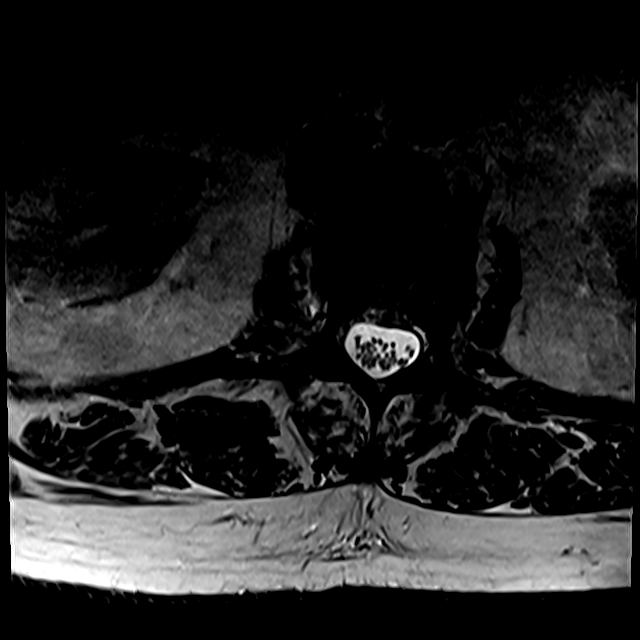

[19 of 48 positions shown; findings below may reference images not displayed]

FINDINGS: Segmentation: Stairs a mentation is assumed. The inferior-most fully
formed intervertebral disc is labeled L5-S1.

Alignment:  Levocurvature.  Grade 1 anterolisthesis of L4 on L5.

Vertebrae: Discogenic/degenerative endplate signal changes at
multiple levels, most conspicuous at L3-L4. Heterogeneous marrow
without suspicious bone lesion. No specific evidence of acute
fracture or discitis/osteomyelitis.

Conus medullaris and cauda equina: Conus extends to the L1 level.
Conus appears normal.

Paraspinal and other soft tissues: Psoas muscle atrophy.

Disc levels:

T12-L1: Broad disc bulge with superimposed central disc protrusion.
Bilateral facet hypertrophy. No significant canal or foraminal
stenosis.

L1-L2: Broad disc bulge with moderate right and mild left facet
hypertrophy. Prominent dorsal epidural fat. Resulting moderate right
foraminal stenosis and moderate right subarticular recess stenosis
with mild to moderate central canal stenosis.

L2-L3: Broad disc bulge with large right subarticular/foraminal disc
protrusion and severe right subarticular recess narrowing. Moderate
central canal stenosis. There is mild-to-moderate right foraminal
stenosis with right far lateral/extraforaminal disc closely
approximating the exiting/exited right L2 nerve. Mild left foraminal
stenosis.

L3-L4: Broad disc bulge with superimposed left
subarticular/foraminal disc protrusion and severe left greater than
right facet hypertrophy. Resulting severe canal stenosis and
moderate to severe left foraminal stenosis. Mild right foraminal
stenosis.

L4-L5: Grade 1 anterolisthesis of L4 on L5 with broad disc bulge,
severe bilateral facet hypertrophy. Resulting severe bilateral
subarticular recess stenosis with mild to moderate canal stenosis.
Moderate left foraminal stenosis.

L5-S1: Posterior disc and endplate spurring. Moderate right greater
than left facet hypertrophy. Mild bilateral subarticular recess
narrowing without significant canal stenosis. Moderate left
foraminal stenosis with left far lateral osteophyte contacting the
exiting left L5 nerve.
IMPRESSION: 1. At L3-L4, severe canal and left subarticular recess stenosis with
moderate to severe left foraminal stenosis.
2. At L2-L3, severe right subarticular recess stenosis, moderate
canal stenosis, and mild-to-moderate right foraminal stenosis with
right far lateral/extraforaminal disc closely approximating the
exiting/exited right L2 nerve.
3. At L4-L5, grade 1 anterolisthesis with severe bilateral
subarticular recess stenosis, mild to moderate canal stenosis, and
moderate left foraminal stenosis.
4. At L5-S1, moderate left foraminal stenosis with left far lateral
osteophyte contacting the exiting left L5 nerve.
5. At L1-L2, moderate right subarticular and foraminal stenosis with
mild to moderate canal stenosis.

## 2022-12-21 DIAGNOSIS — R2689 Other abnormalities of gait and mobility: Secondary | ICD-10-CM | POA: Diagnosis not present

## 2022-12-21 DIAGNOSIS — R269 Unspecified abnormalities of gait and mobility: Secondary | ICD-10-CM | POA: Diagnosis not present

## 2022-12-22 DIAGNOSIS — R269 Unspecified abnormalities of gait and mobility: Secondary | ICD-10-CM | POA: Diagnosis not present

## 2022-12-22 DIAGNOSIS — Z6829 Body mass index (BMI) 29.0-29.9, adult: Secondary | ICD-10-CM | POA: Diagnosis not present

## 2022-12-23 DIAGNOSIS — R2689 Other abnormalities of gait and mobility: Secondary | ICD-10-CM | POA: Diagnosis not present

## 2022-12-23 DIAGNOSIS — R269 Unspecified abnormalities of gait and mobility: Secondary | ICD-10-CM | POA: Diagnosis not present

## 2023-01-13 DIAGNOSIS — R269 Unspecified abnormalities of gait and mobility: Secondary | ICD-10-CM | POA: Diagnosis not present

## 2023-01-13 DIAGNOSIS — Z981 Arthrodesis status: Secondary | ICD-10-CM | POA: Diagnosis not present

## 2023-01-13 DIAGNOSIS — Z5181 Encounter for therapeutic drug level monitoring: Secondary | ICD-10-CM | POA: Diagnosis not present

## 2023-01-13 DIAGNOSIS — Z79899 Other long term (current) drug therapy: Secondary | ICD-10-CM | POA: Diagnosis not present

## 2023-01-13 DIAGNOSIS — M5136 Other intervertebral disc degeneration, lumbar region with discogenic back pain only: Secondary | ICD-10-CM | POA: Diagnosis not present

## 2023-01-13 DIAGNOSIS — M5414 Radiculopathy, thoracic region: Secondary | ICD-10-CM | POA: Diagnosis not present

## 2023-01-14 ENCOUNTER — Telehealth: Payer: Self-pay | Admitting: Cardiology

## 2023-01-14 ENCOUNTER — Other Ambulatory Visit: Payer: Self-pay

## 2023-01-14 MED ORDER — APIXABAN 5 MG PO TABS: 5.0000 mg | ORAL_TABLET | Freq: Two times a day (BID) | ORAL | 0 refills | Status: DC

## 2023-01-14 NOTE — Telephone Encounter (Signed)
Pt c/o medication issue:  1. Name of Medication: ELIQUIS 5 MG TABS tablet   2. How are you currently taking this medication (dosage and times per day)?    3. Are you having a reaction (difficulty breathing--STAT)? no  4. What is your medication issue? Calling to see if our office have any samples. Please advise

## 2023-01-21 ENCOUNTER — Ambulatory Visit: Payer: PPO | Admitting: Cardiology

## 2023-01-29 ENCOUNTER — Other Ambulatory Visit: Payer: Self-pay | Admitting: Internal Medicine

## 2023-02-04 ENCOUNTER — Other Ambulatory Visit: Payer: Self-pay | Admitting: Internal Medicine

## 2023-02-08 ENCOUNTER — Ambulatory Visit: Payer: PPO | Admitting: Neurology

## 2023-02-08 ENCOUNTER — Encounter: Payer: Self-pay | Admitting: Neurology

## 2023-02-08 VITALS — BP 136/70 | HR 51 | Ht 72.0 in | Wt 210.0 lb

## 2023-02-08 DIAGNOSIS — I6340 Cerebral infarction due to embolism of unspecified cerebral artery: Secondary | ICD-10-CM

## 2023-02-08 DIAGNOSIS — R2681 Unsteadiness on feet: Secondary | ICD-10-CM | POA: Diagnosis not present

## 2023-02-08 NOTE — Progress Notes (Signed)
Follow-up Visit   Date: 02/08/23   Vernon Bishop MRN: [id number] DOB: 1940/04/20   Interim History: Vernon Bishop is a 82 y.o. right-handed Caucasian male with PAF, hypertension, hyperlipidemia, s/p cervical and lumbar surgery, T11-12 spinal canal stenosis s/p decompression returning to the clinic for follow-up of pontine stroke.  The patient was accompanied to the clinic by wife who also provides collateral information.     IMPRESSION/PLAN: Left paramedian pontine infarct (2023) etiology small vessel disease vs. Embolic (history of paroxysmal atrial fibrillation not on AC), manifesting with tongue numbness and dysarthria.  He was started on Eliquis and statin therapy at discharge.  Clinically, he has no residual deficits.   - Continue Eliquis 5mg  BID   - Continue zetia 10mg  daily (LDL is well-controlled)  Multifactorial unsteady gait due to multilevel lumbar spondylosis s/p surgery by Dr. Shon Baton, left foot drop from L5 radiculopathy, neuropathy - Recommend using a walker  3.  Left ulnar neuropathy, severe  - Continue supportive care   Return to clinic as needed   ------------------------------------------ UPDATE 02/08/2023:  He underwent thoacic laminetctomy in June by Dr. Yetta Barre for spinal stenosos at T11-12.  He has noticed return of sensation in the feet.  His balance is poor and he is using a cane most of the time.  Fortunately, no interval falls.    He no longer has tongue numbness or speech changes.  No interval TIA/stroke.  He remains on Eliquis 5mg  BID.     Medications:  Current Outpatient Medications on File Prior to Visit  Medication Sig Dispense Refill   acetaminophen (TYLENOL) 325 MG tablet Take 325-650 mg by mouth every 6 (six) hours as needed for mild pain or headache.     amLODipine (NORVASC) 5 MG tablet Take 1 tablet (5 mg total) by mouth daily. 90 tablet 3   apixaban (ELIQUIS) 5 MG TABS tablet Take 1 tablet (5 mg total) by mouth 2 (two) times daily. 28  tablet 0   Ascorbic Acid (C-500/ROSE HIPS PO) Take by mouth.     chlorhexidine (HIBICLENS) 4 % external liquid Apply topically daily as needed. Use 1-2 times in the shower 500 mL 0   Cholecalciferol (VITAMIN D3 PO) Take 1 tablet by mouth daily.     clotrimazole-betamethasone (LOTRISONE) cream Apply 1 Application topically 2 (two) times daily. 45 g 1   Cyanocobalamin (VITAMIN B-12 CR) 1000 MCG TBCR Take 1,000 mcg by mouth daily.       ELIQUIS 5 MG TABS tablet TAKE 1 TABLET BY MOUTH 2 TIMES DAILY. 60 tablet 3   ezetimibe (ZETIA) 10 MG tablet Take 1 tablet (10 mg total) by mouth daily. 30 tablet 3   FLUoxetine (PROZAC) 40 MG capsule TAKE 1 CAPSULE (40 MG TOTAL) BY MOUTH DAILY. 90 capsule 2   furosemide (LASIX) 20 MG tablet TAKE 1 TABLET (20 MG TOTAL) BY MOUTH DAILY. 30 tablet 3   gabapentin (NEURONTIN) 300 MG capsule TAKE 1 CAPSULE (300 MG TOTAL) BY MOUTH 3 (THREE) TIMES DAILY. 270 capsule 3   methocarbamol (ROBAXIN) 500 MG tablet Take 1 tablet (500 mg total) by mouth every 6 (six) hours as needed for muscle spasms. 30 tablet 1   Multiple Vitamin (MULTIVITAMIN WITH MINERALS) TABS tablet Take 1 tablet by mouth daily.     mupirocin ointment (BACTROBAN) 2 % On leg wound w/dressing change qd or bid 30 g 0   olmesartan-hydrochlorothiazide (BENICAR HCT) 20-12.5 MG tablet TAKE 1 TABLET BY MOUTH DAILY. 90 tablet 3  PRESCRIPTION MEDICATION CPAP- At bedtime     propranolol ER (INDERAL LA) 80 MG 24 hr capsule Take 1 capsule (80 mg total) by mouth daily. 90 capsule 3   Saw Palmetto, Serenoa repens, (SAW PALMETTO PO) Take 1 capsule by mouth daily.     sodium chloride (OCEAN) 0.65 % SOLN nasal spray Place 1 spray into both nostrils as needed for congestion.     tetrahydrozoline 0.05 % ophthalmic solution Place 1 drop into both eyes 2 (two) times daily as needed (for redness).     traZODone (DESYREL) 50 MG tablet Take 1-2 tablets (50-100 mg total) by mouth at bedtime. 60 tablet 5   triamcinolone ointment  (KENALOG) 0.1 % Apply 1 Application topically 4 (four) times daily. (Patient taking differently: Apply 1 Application topically daily as needed (irritation).) 160 g 2   zinc gluconate 50 MG tablet Take 50 mg by mouth daily.     No current facility-administered medications on file prior to visit.    Allergies:  Allergies  Allergen Reactions   Abilify [Aripiprazole] Shortness Of Breath    Made pt "feel crazy"   Cymbalta [Duloxetine Hcl] Other (See Comments)    Constipation    Digoxin And Related Other (See Comments)    HR 41 (patient is unsure about this)   Prednisolone Swelling and Other (See Comments)    Edema, weight gain   Temazepam Other (See Comments)    "Made me feel crazy"   Zanaflex [Tizanidine]     Low BP, speech dysturbance. Possible   Other Rash and Other (See Comments)    Zomax/Zomepirac- Welts, also    Vital Signs:  BP 136/70   Pulse (!) 51   Ht 6' (1.829 m)   Wt 210 lb (95.3 kg)   SpO2 98%   BMI 28.48 kg/m   Neurological Exam: MENTAL STATUS including orientation to time, place, person, recent and remote memory, attention span and concentration, language, and fund of knowledge is normal.  Speech is not dysarthric.  CRANIAL NERVES:    Pupils equal round and reactive to light.  Normal conjugate, extra-ocular eye movements in all directions of gaze.  No ptosis.  Face is symmetric.   MOTOR:  Motor strength is 5/5 in all extremities, except 4/5 with left dorsiflexion, toe extension, eversion and inversion., 4/5 in intrinsic hand weakness.  Severe left FDI and ADM atrophy, and left lower leg atrophy.  No fasciculations or abnormal movements.  No pronator drift.  Tone is normal.     COORDINATION/GAIT:  Normal finger-to- nose-finger.  Intact rapid alternating movements bilaterally.  Gait appears unsteady and antalgic.  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 Chronic L5  radiculopathy affecting the left lower extremity, severe. Left ulnar axonal neuropathy,  nonlocalizable, severe. Sparse myopathic motor units were isolated to the rectus femoris muscle and too limited for definitive diagnosis.  Correlate clinically.   Mobile cardiac telemetry 13 days 04/25/2021 - 05/09/2021: Dominant rhythm: Sinus. HR 36-152 bpm. Avg HR 52 bpm. 52 episodes of atrial tachycardia/SVT, fastest at 135 bpm for 13 beats, longest for 15.9 secs at 100 bpm. 2.6% isolated SVE, <1% couplet/triplets. 1 episode of VT, at 105 bpm for 5 beats <1% isolated VE, couplet/triplets. No atrial fibrillation/atrial flutter/high grade AV block, sinus pause >3sec noted. 0 patient triggered events.    MRI brain 06/08/2021: Small acute and chronic pontine infarcts.   CTA head/neck 06/08/2021: 1. Negative CTA for large vessel occlusion or  other emergent finding. 2. Moderate atheromatous disease involving the intracranial posterior circulation without correctable stenosis. The left PCA is severely attenuated and only faintly visible, but remains grossly patent to its distal aspect. Predominant fetal type origin of the right PCA. 3. Wide patency of the major arterial vasculature of the neck.   MRI lumbar spine 10/24/2020: 1. At L3-L4, severe canal and left subarticular recess stenosis with moderate to severe left foraminal stenosis. 2. At L2-L3, severe right subarticular recess stenosis, moderate canal stenosis, and mild-to-moderate right foraminal stenosis with right far lateral/extraforaminal disc closely approximating the exiting/exited right L2 nerve. 3. At L4-L5, grade 1 anterolisthesis with severe bilateral subarticular recess stenosis, mild to moderate canal stenosis, and moderate left foraminal stenosis. 4. At L5-S1, moderate left foraminal stenosis with left far lateral osteophyte contacting the exiting left L5 nerve. 5. At L1-L2, moderate right subarticular and foraminal stenosis with mild  to moderate canal stenosis.     Lab Results  Component Value Date   CHOL 119 05/20/2022   HDL 35 (L) 05/20/2022   LDLCALC 65 05/20/2022   TRIG 102 05/20/2022   CHOLHDL 3.4 05/20/2022   Lab Results  Component Value Date   HGBA1C 5.0 06/09/2021   Lab Results  Component Value Date   CKTOTAL 27 04/05/2019   Lab Results  Component Value Date   LDLCALC 65 05/20/2022        Thank you for allowing me to participate in patient's care.  If I can answer any additional questions, I would be pleased to do so.    Sincerely,    Jamis Kryder K. Allena Katz, DO

## 2023-02-15 ENCOUNTER — Encounter: Payer: Self-pay | Admitting: Cardiology

## 2023-02-15 ENCOUNTER — Ambulatory Visit: Payer: PPO | Attending: Cardiology | Admitting: Cardiology

## 2023-02-15 ENCOUNTER — Other Ambulatory Visit: Payer: Self-pay

## 2023-02-15 VITALS — BP 140/60 | HR 94 | Ht 72.0 in | Wt 208.0 lb

## 2023-02-15 DIAGNOSIS — I10 Essential (primary) hypertension: Secondary | ICD-10-CM | POA: Diagnosis not present

## 2023-02-15 DIAGNOSIS — I480 Paroxysmal atrial fibrillation: Secondary | ICD-10-CM

## 2023-02-15 MED ORDER — APIXABAN 5 MG PO TABS: 5.0000 mg | ORAL_TABLET | Freq: Two times a day (BID) | ORAL | 5 refills | Status: DC

## 2023-02-15 NOTE — Progress Notes (Signed)
  Cardiology Office Note:  .   Date:  02/15/2023  ID:  [name], DOB 1940-12-18, MRN [id number] PCP: Tresa Garter, MD  [facility] Providers Cardiologist:  Truett Mainland, MD PCP: Tresa Garter, MD  Chief Complaint  Patient presents with   PAF      History of Present Illness: .    ABAS SGRO is a 82 y.o. male with  hypertension, OSA, remote h/o Afib, pontine stroke (05/2021),   Patient is doing well, denies any chest pain shortness of breath symptoms.  Blood pressure elevated today, generally very well-controlled at home as well as other provider visits recently.  Vitals:   02/15/23 1122  BP: (!) 140/60  Pulse: 94  SpO2: 98%     ROS:  Review of Systems  Cardiovascular:  Negative for chest pain, dyspnea on exertion, leg swelling, palpitations and syncope.     Studies Reviewed: Marland Kitchen        Independently interpreted Labs 04/2022: LDL: 65  Risk Assessment/Calculations:    CHA2DS2-VASc Score = 5   This indicates a 7.2 % annual risk of stroke. The patient's score is based upon:        Physical Exam:   Physical Exam Vitals and nursing note reviewed.  Constitutional:      General: He is not in acute distress. Neck:     Vascular: No JVD.  Cardiovascular:     Rate and Rhythm: Normal rate and regular rhythm.     Heart sounds: Normal heart sounds. No murmur heard. Pulmonary:     Effort: Pulmonary effort is normal.     Breath sounds: Normal breath sounds. No wheezing or rales.  Musculoskeletal:     Right lower leg: No edema.     Left lower leg: No edema.      VISIT DIAGNOSES:   ICD-10-CM   1. Paroxysmal atrial fibrillation (HCC)  I48.0     2. Essential hypertension  I10        ASSESSMENT AND PLAN: .    BOLESLAUS MORVAN is a 82 y.o. male with hypertension, OSA, remote h/o Afib, pontine stroke (05/2021),    Sinus bradycardia: Previously noted.  This most likely is due to propranolol, which patient has been on very long  time. No indication for pacemaker.   Stroke: Small acute on chronic pontine infarcts discovered in March 2023. Known remote history of A-fib.  Even though cardiac telemetry (2023) did not show A-fib, most likely etiology of his multifocal stroke is paroxysmal A-fib. High CHA2DS2VASc score.  Continue Eliquis 5 mg twice daily. Tolerating pravastatin and Zetia with lipids well controlled-LDL 65 (04/2022).   Hypertension: Well controlled.       No orders of the defined types were placed in this encounter.    F/u in 1 year  Signed, Elder Negus, MD

## 2023-02-15 NOTE — Telephone Encounter (Signed)
Prescription refill request for Eliquis received. Indication:afib Last office visit:11/24 Scr:0.94  6/24 Age: 82 Weight:94.3  kg  Prescription refilled

## 2023-02-15 NOTE — Patient Instructions (Signed)
Medication Instructions:   Your physician recommends that you continue on your current medications as directed. Please refer to the Current Medication list given to you today.  *If you need a refill on your cardiac medications before your next appointment, please call your pharmacy*    Follow-Up: At [facility], you and your health needs are our priority.  As part of our continuing mission to provide you with exceptional heart care, we have created designated Provider Care Teams.  These Care Teams include your primary Cardiologist (physician) and Advanced Practice Providers (APPs -  Physician Assistants and Nurse Practitioners) who all work together to provide you with the care you need, when you need it.  We recommend signing up for the patient portal called "MyChart".  Sign up information is provided on this After Visit Summary.  MyChart is used to connect with patients for Virtual Visits (Telemedicine).  Patients are able to view lab/test results, encounter notes, upcoming appointments, etc.  Non-urgent messages can be sent to your provider as well.   To learn more about what you can do with MyChart, go to [email or web address].    Your next appointment:   1 year(s)  DR. PATWARDHAN

## 2023-02-17 DIAGNOSIS — M5414 Radiculopathy, thoracic region: Secondary | ICD-10-CM | POA: Diagnosis not present

## 2023-02-17 DIAGNOSIS — R269 Unspecified abnormalities of gait and mobility: Secondary | ICD-10-CM | POA: Diagnosis not present

## 2023-02-17 DIAGNOSIS — M5136 Other intervertebral disc degeneration, lumbar region with discogenic back pain only: Secondary | ICD-10-CM | POA: Diagnosis not present

## 2023-02-17 DIAGNOSIS — Z981 Arthrodesis status: Secondary | ICD-10-CM | POA: Diagnosis not present

## 2023-02-19 ENCOUNTER — Other Ambulatory Visit: Payer: Self-pay | Admitting: Internal Medicine

## 2023-03-18 ENCOUNTER — Other Ambulatory Visit: Payer: Self-pay | Admitting: Cardiology

## 2023-04-06 ENCOUNTER — Ambulatory Visit: Payer: Self-pay | Admitting: Internal Medicine

## 2023-04-06 NOTE — Telephone Encounter (Signed)
  Chief Complaint: Weakness/fatigue Symptoms: Weakness, fatigue, itching on arms/legs/groin Frequency: About 2 weeks Pertinent Negatives: Patient denies fever Disposition: [] ED /[] Urgent Care (no appt availability in office) / [x] Appointment(In office/virtual)/ []  [facility]/ [] Home Care/ [] Refused Recommended Disposition /[] [facility]/ []  Follow-up with PCP Additional Notes: Patient's wife called in stating patient has been extra weak and fatigued over last two weeks. States patient has also been itching areas on arm, leg, and groin and wife noticed some darkened red areas on these areas. Patient's wife states patient is on Eliquis  and she is unsure if this is due to 'blood pooling' in these areas. Patient's wife states patient has recently begun using walker around the home and that indicates he is extra weak. Patient denies any medication changes aside from stopping one cholesterol medication. Patient appointment made for 04/07/23 in office with Dr. Garald.  Copied from CRM [id number]. Topic: Clinical - Red Word Triage >> Apr 06, 2023  2:37 PM Alfonso ORN wrote: Red Word that prompted transfer to Nurse Triage: patient has gotten really weak extreme Fatigue  , a couple of places on arm , upper leg itchy , bruise colored  patient is on blood thinners    Reason for Disposition  [1] MODERATE weakness (i.e., interferes with work, school, normal activities) AND [2] persists > 3 days  Answer Assessment - Initial Assessment Questions 1. DESCRIPTION: Describe how you are feeling.     Patient is seemingly weaker, has begun using walker 2. SEVERITY: How bad is it?  Can you stand and walk?   - MILD (0-3): Feels weak or tired, but does not interfere with work, school or normal activities.   - MODERATE (4-7): Able to stand and walk; weakness interferes with work, school, or normal activities.   - SEVERE (8-10): Unable to stand or walk; unable to do usual activities.      'patient's wife states he has started using walker and that indicates weakness that needs to be looked' 3. ONSET: When did these symptoms begin? (e.g., hours, days, weeks, months)     About a week ago 4. CAUSE: What do you think is causing the weakness or fatigue? (e.g., not drinking enough fluids, medical problem, trouble sleeping)     Unsure, patient's wife wanted to check blood pressure but bp machine isnt working 5. NEW MEDICINES:  Have you started on any new medicines recently? (e.g., opioid pain medicines, benzodiazepines, muscle relaxants, antidepressants, antihistamines, neuroleptics, beta blockers)     No 6. OTHER SYMPTOMS: Do you have any other symptoms? (e.g., chest pain, fever, cough, SOB, vomiting, diarrhea, bleeding, other areas of pain)     Weakness, fatigue, no appetite, itching on legs, arms, groin area  Protocols used: Weakness (Generalized) and Fatigue-A-AH

## 2023-04-07 ENCOUNTER — Ambulatory Visit (INDEPENDENT_AMBULATORY_CARE_PROVIDER_SITE_OTHER): Payer: PPO | Admitting: Internal Medicine

## 2023-04-07 ENCOUNTER — Encounter: Payer: Self-pay | Admitting: Internal Medicine

## 2023-04-07 VITALS — BP 128/82 | HR 88 | Temp 98.2°F | Ht 72.0 in | Wt 202.0 lb

## 2023-04-07 DIAGNOSIS — G8929 Other chronic pain: Secondary | ICD-10-CM

## 2023-04-07 DIAGNOSIS — M545 Low back pain, unspecified: Secondary | ICD-10-CM

## 2023-04-07 DIAGNOSIS — E538 Deficiency of other specified B group vitamins: Secondary | ICD-10-CM

## 2023-04-07 DIAGNOSIS — I480 Paroxysmal atrial fibrillation: Secondary | ICD-10-CM

## 2023-04-07 DIAGNOSIS — R110 Nausea: Secondary | ICD-10-CM | POA: Insufficient documentation

## 2023-04-07 DIAGNOSIS — B372 Candidiasis of skin and nail: Secondary | ICD-10-CM | POA: Insufficient documentation

## 2023-04-07 DIAGNOSIS — R29898 Other symptoms and signs involving the musculoskeletal system: Secondary | ICD-10-CM | POA: Diagnosis not present

## 2023-04-07 LAB — CBC WITH DIFFERENTIAL/PLATELET
Basophils Absolute: 0.1 10*3/uL (ref 0.0–0.1)
Basophils Relative: 0.7 % (ref 0.0–3.0)
Eosinophils Absolute: 0 10*3/uL (ref 0.0–0.7)
Eosinophils Relative: 0.6 % (ref 0.0–5.0)
HCT: 39.2 % (ref 39.0–52.0)
Hemoglobin: 13.2 g/dL (ref 13.0–17.0)
Lymphocytes Relative: 17.7 % (ref 12.0–46.0)
Lymphs Abs: 1.4 10*3/uL (ref 0.7–4.0)
MCHC: 33.7 g/dL (ref 30.0–36.0)
MCV: 90.7 fL (ref 78.0–100.0)
Monocytes Absolute: 0.5 10*3/uL (ref 0.1–1.0)
Monocytes Relative: 6.4 % (ref 3.0–12.0)
Neutro Abs: 6.1 10*3/uL (ref 1.4–7.7)
Neutrophils Relative %: 74.6 % (ref 43.0–77.0)
Platelets: 225 10*3/uL (ref 150.0–400.0)
RBC: 4.31 Mil/uL (ref 4.22–5.81)
RDW: 13.4 % (ref 11.5–15.5)
WBC: 8.2 10*3/uL (ref 4.0–10.5)

## 2023-04-07 LAB — COMPREHENSIVE METABOLIC PANEL
ALT: 21 U/L (ref 0–53)
AST: 22 U/L (ref 0–37)
Albumin: 4.1 g/dL (ref 3.5–5.2)
Alkaline Phosphatase: 96 U/L (ref 39–117)
BUN: 20 mg/dL (ref 6–23)
CO2: 22 meq/L (ref 19–32)
Calcium: 9.5 mg/dL (ref 8.4–10.5)
Chloride: 99 meq/L (ref 96–112)
Creatinine, Ser: 0.67 mg/dL (ref 0.40–1.50)
GFR: 87.05 mL/min (ref >60.00)
Glucose, Bld: 91 mg/dL (ref 70–99)
Potassium: 4.1 meq/L (ref 3.5–5.1)
Sodium: 134 meq/L — ABNORMAL LOW (ref 135–145)
Total Bilirubin: 0.5 mg/dL (ref 0.2–1.2)
Total Protein: 6.9 g/dL (ref 6.0–8.3)

## 2023-04-07 LAB — TSH: TSH: 2.86 u[IU]/mL (ref 0.35–5.50)

## 2023-04-07 LAB — VITAMIN B12: Vitamin B-12: 418 pg/mL (ref 211–911)

## 2023-04-07 LAB — CK: Total CK: 44 U/L (ref 7–232)

## 2023-04-07 LAB — LIPASE: Lipase: 76 U/L — ABNORMAL HIGH (ref 11.0–59.0)

## 2023-04-07 MED ORDER — PANTOPRAZOLE SODIUM 40 MG PO TBEC
40.0000 mg | DELAYED_RELEASE_TABLET | Freq: Every day | ORAL | 3 refills | Status: DC
Start: 2023-04-07 — End: 2023-09-03

## 2023-04-07 MED ORDER — KETOCONAZOLE 2 % EX CREA: 1.0000 | TOPICAL_CREAM | Freq: Every day | CUTANEOUS | 1 refills | Status: DC

## 2023-04-07 NOTE — Assessment & Plan Note (Signed)
 Start Protonix Check LFTs, Lipase RTC 2 wks

## 2023-04-07 NOTE — Assessment & Plan Note (Signed)
 Worse Hold Zetia Check CBC, ESR, CK

## 2023-04-07 NOTE — Assessment & Plan Note (Signed)
 Start Nizoral cream

## 2023-04-07 NOTE — Assessment & Plan Note (Signed)
 Check B12

## 2023-04-07 NOTE — Progress Notes (Signed)
 Subjective:  Patient ID: Vernon Bishop, male    DOB: [birth year]  Age: 83 y.o. MRN: [id number]  CC: Fatigue (And weakness, dizziness, no appetite , troubles with bladder and pain all over) and Arm Pain (Pt states that he is on blood thinners and scratch at his arms a lot and would like for you to take a look at them )   HPI Vernon Bishop presents for Fatigue,  weakness, dizziness, no appetite , troubles with bladder and pain all over) and Arm Pain (Pt states that he is on blood thinners and scratch at his arms a lot and would like for you to take a look at them ) x 1-2 weeks  No melena  C/o rash  He is here with his wife who helps with history  Outpatient Medications Prior to Visit  Medication Sig Dispense Refill   acetaminophen  (TYLENOL ) 325 MG tablet Take 325-650 mg by mouth every 6 (six) hours as needed for mild pain or headache.     amLODipine  (NORVASC ) 5 MG tablet Take 1 tablet (5 mg total) by mouth daily. 90 tablet 3   apixaban  (ELIQUIS ) 5 MG TABS tablet Take 1 tablet (5 mg total) by mouth 2 (two) times daily. 60 tablet 5   Ascorbic Acid (C-500/ROSE HIPS PO) Take by mouth.     chlorhexidine  (HIBICLENS ) 4 % external liquid Apply topically daily as needed. Use 1-2 times in the shower 500 mL 0   Cholecalciferol  (VITAMIN D3 PO) Take 1 tablet by mouth daily.     clotrimazole -betamethasone  (LOTRISONE ) cream Apply 1 Application topically 2 (two) times daily. 45 g 1   Cyanocobalamin  (VITAMIN B-12 CR) 1000 MCG TBCR Take 1,000 mcg by mouth daily.       FLUoxetine  (PROZAC ) 40 MG capsule TAKE 1 CAPSULE (40 MG TOTAL) BY MOUTH DAILY. 90 capsule 2   furosemide  (LASIX ) 20 MG tablet TAKE 1 TABLET (20 MG TOTAL) BY MOUTH DAILY. 30 tablet 3   gabapentin  (NEURONTIN ) 300 MG capsule TAKE 1 CAPSULE (300 MG TOTAL) BY MOUTH 3 (THREE) TIMES DAILY. 270 capsule 3   methocarbamol  (ROBAXIN ) 500 MG tablet Take 1 tablet (500 mg total) by mouth every 6 (six) hours as needed for muscle spasms. 30 tablet 1    Multiple Vitamin (MULTIVITAMIN WITH MINERALS) TABS tablet Take 1 tablet by mouth daily.     mupirocin  ointment (BACTROBAN ) 2 % On leg wound w/dressing change qd or bid 30 g 0   olmesartan -hydrochlorothiazide  (BENICAR  HCT) 20-12.5 MG tablet TAKE 1 TABLET BY MOUTH DAILY. 90 tablet 3   PRESCRIPTION MEDICATION CPAP- At bedtime     propranolol  ER (INDERAL  LA) 80 MG 24 hr capsule Take 1 capsule (80 mg total) by mouth daily. 90 capsule 3   Saw Palmetto, Serenoa repens, (SAW PALMETTO PO) Take 1 capsule by mouth daily.     sodium chloride  (OCEAN) 0.65 % SOLN nasal spray Place 1 spray into both nostrils as needed for congestion.     tetrahydrozoline 0.05 % ophthalmic solution Place 1 drop into both eyes 2 (two) times daily as needed (for redness).     traZODone  (DESYREL ) 50 MG tablet TAKE 1 TO 2 TABLETS BY MOUTH AT BEDTIME. 60 tablet 5   triamcinolone  ointment (KENALOG ) 0.1 % Apply 1 Application topically 4 (four) times daily. (Patient taking differently: Apply 1 Application topically daily as needed (irritation).) 160 g 2   zinc  gluconate 50 MG tablet Take 50 mg by mouth daily.     ezetimibe  (ZETIA )  10 MG tablet Take 1 tablet (10 mg total) by mouth daily. 30 tablet 3   No facility-administered medications prior to visit.    ROS: Review of Systems  Constitutional:  Positive for fatigue. Negative for appetite change and unexpected weight change.  HENT:  Negative for congestion, nosebleeds, sneezing, sore throat and trouble swallowing.   Eyes:  Negative for itching and visual disturbance.  Respiratory:  Negative for cough and wheezing.   Cardiovascular:  Negative for chest pain, palpitations and leg swelling.  Gastrointestinal:  Positive for nausea. Negative for abdominal distention, blood in stool, diarrhea and vomiting.  Genitourinary:  Negative for frequency and hematuria.  Musculoskeletal:  Positive for arthralgias, back pain and gait problem. Negative for joint swelling and neck pain.  Skin:   Negative for rash.  Neurological:  Negative for dizziness, tremors, speech difficulty and weakness.  Hematological:  Bruises/bleeds easily.  Psychiatric/Behavioral:  Negative for agitation, dysphoric mood, sleep disturbance and suicidal ideas. The patient is nervous/anxious.     Objective:  BP 128/82 (BP Location: Left Arm, Patient Position: Sitting, Cuff Size: Normal)   Pulse 88   Temp 98.2 F (36.8 C) (Oral)   Ht 6' (1.829 m)   Wt 202 lb (91.6 kg)   SpO2 98%   BMI 27.40 kg/m   BP Readings from Last 3 Encounters:  04/07/23 128/82  02/15/23 (!) 140/60  02/08/23 136/70    Wt Readings from Last 3 Encounters:  04/07/23 202 lb (91.6 kg)  02/15/23 208 lb (94.3 kg)  02/08/23 210 lb (95.3 kg)    Physical Exam Constitutional:      General: He is not in acute distress.    Appearance: Normal appearance. He is well-developed.     Comments: NAD  Eyes:     Conjunctiva/sclera: Conjunctivae normal.     Pupils: Pupils are equal, round, and reactive to light.  Neck:     Thyroid : No thyromegaly.     Vascular: No JVD.  Cardiovascular:     Rate and Rhythm: Normal rate and regular rhythm.     Heart sounds: Normal heart sounds. No murmur heard.    No friction rub. No gallop.  Pulmonary:     Effort: Pulmonary effort is normal. No respiratory distress.     Breath sounds: Normal breath sounds. No wheezing or rales.  Chest:     Chest wall: No tenderness.  Abdominal:     General: Bowel sounds are normal. There is no distension.     Palpations: Abdomen is soft. There is no mass.     Tenderness: There is no abdominal tenderness. There is no guarding or rebound.  Musculoskeletal:        General: Tenderness present. Normal range of motion.     Cervical back: Normal range of motion.     Left lower leg: Edema present.  Lymphadenopathy:     Cervical: No cervical adenopathy.  Skin:    General: Skin is warm and dry.     Findings: No rash.  Neurological:     Mental Status: He is alert and  oriented to person, place, and time.     Cranial Nerves: No cranial nerve deficit.     Motor: Weakness present. No abnormal muscle tone.     Coordination: Coordination normal.     Gait: Gait abnormal.     Deep Tendon Reflexes: Reflexes are normal and symmetric.  Psychiatric:        Behavior: Behavior normal.        Thought  Content: Thought content normal.        Judgment: Judgment normal.   Weak, unsteady Using a walker now LS w/pain Bruises Groin rash    A total time of 45 minutes was spent preparing to see the patient, reviewing tests, x-rays, operative reports and other medical records.  Also, obtaining history and performing comprehensive physical exam.  Additionally, counseling the patient regarding the above listed issues -weakness, nausea, rash.   Finally, documenting clinical information in the health records, coordination of care, educating the patient. It is a complex case.    Lab Results  Component Value Date   WBC 8.2 04/07/2023   HGB 13.2 04/07/2023   HCT 39.2 04/07/2023   PLT 225.0 04/07/2023   GLUCOSE 91 04/07/2023   CHOL 119 05/20/2022   TRIG 102 05/20/2022   HDL 35 (L) 05/20/2022   LDLCALC 65 05/20/2022   ALT 21 04/07/2023   AST 22 04/07/2023   NA 134 (L) 04/07/2023   K 4.1 04/07/2023   CL 99 04/07/2023   CREATININE 0.67 04/07/2023   BUN 20 04/07/2023   CO2 22 04/07/2023   TSH 2.86 04/07/2023   PSA 0.73 12/20/2019   INR 1.2 04/22/2022   HGBA1C 5.0 06/09/2021   MICROALBUR 3.9 12/20/2019    DG Lumbar Spine 2-3 Views Result Date: 09/11/2022 CLINICAL DATA:  Elective surgery. EXAM: LUMBAR SPINE - 2-3 VIEW COMPARISON:  Radiograph 08/25/2022 FINDINGS: Two portable cross-table lateral views of the lumbar spine obtained in the operating room. Image 1 demonstrates previous lumbar hardware in place with instrument localizing posterior to the L1 level. Image 2 demonstrates surgical instrument posterior to the L2 level. IMPRESSION: Intraoperative radiographs  during lumbar spine surgery. Electronically Signed   By: Andrea Gasman M.D.   On: 09/11/2022 14:42    Assessment & Plan:   Problem List Items Addressed This Visit     Vitamin B12 deficiency   Check B12      Relevant Orders   Vitamin B12 (Completed)   Atrial fibrillation (HCC) - Primary   On Eliquis  MRI - Chronic pontine infarcts.       Relevant Orders   TSH (Completed)   Urinalysis   CK (Completed)   CBC with Differential/Platelet (Completed)   Comprehensive metabolic panel (Completed)   Leg weakness, bilateral   Worse Hold Zetia  Check CBC, ESR, CK      Relevant Orders   TSH (Completed)   Urinalysis   CK (Completed)   CBC with Differential/Platelet (Completed)   Comprehensive metabolic panel (Completed)   Chronic back pain   Foraminectomy/laminectomy pending by Dr Joshua on 09/11/22 (T11-12, T12-L1) - LBP 7/10 Staph+: Hibiclens , Bactroban       Nausea   Start Protonix  Check LFTs, Lipase RTC 2 wks      Relevant Orders   Lipase (Completed)   Intertriginous candidiasis   Start Nizoral  cream      Relevant Medications   ketoconazole  (NIZORAL ) 2 % cream      Meds ordered this encounter  Medications   pantoprazole  (PROTONIX ) 40 MG tablet    Sig: Take 1 tablet (40 mg total) by mouth daily.    Dispense:  30 tablet    Refill:  3   ketoconazole  (NIZORAL ) 2 % cream    Sig: Apply 1 Application topically daily.    Dispense:  45 g    Refill:  1      Follow-up: Return in about 2 weeks (around 04/21/2023) for a follow-up visit.  Marolyn Noel, MD

## 2023-04-07 NOTE — Assessment & Plan Note (Signed)
Foraminectomy/laminectomy pending by Dr Yetta Barre on 09/11/22 (T11-12, T12-L1) - LBP 7/10 Staph+: Hibiclens, Bactroban

## 2023-04-07 NOTE — Assessment & Plan Note (Signed)
On Eliquis MRI - Chronic pontine infarcts.

## 2023-04-08 ENCOUNTER — Other Ambulatory Visit: Payer: Self-pay | Admitting: Internal Medicine

## 2023-04-08 DIAGNOSIS — R110 Nausea: Secondary | ICD-10-CM

## 2023-04-08 DIAGNOSIS — R748 Abnormal levels of other serum enzymes: Secondary | ICD-10-CM

## 2023-04-08 LAB — URINALYSIS, ROUTINE W REFLEX MICROSCOPIC
Bilirubin Urine: NEGATIVE
Hgb urine dipstick: NEGATIVE
Ketones, ur: 15 — AB
Leukocytes,Ua: NEGATIVE
Nitrite: NEGATIVE
Specific Gravity, Urine: 1.015 (ref 1.000–1.030)
Total Protein, Urine: 100 — AB
Urine Glucose: NEGATIVE
Urobilinogen, UA: 0.2 (ref 0.0–1.0)
pH: >=9 — AB (ref 5.0–8.0)

## 2023-04-09 ENCOUNTER — Ambulatory Visit
Admission: RE | Admit: 2023-04-09 | Discharge: 2023-04-09 | Disposition: A | Discharge status: Home / Self Care | Payer: PPO | Source: Ambulatory Visit | Attending: Internal Medicine | Admitting: Internal Medicine

## 2023-04-09 ENCOUNTER — Telehealth: Payer: Self-pay | Admitting: Internal Medicine

## 2023-04-09 DIAGNOSIS — R110 Nausea: Secondary | ICD-10-CM

## 2023-04-09 DIAGNOSIS — R748 Abnormal levels of other serum enzymes: Secondary | ICD-10-CM

## 2023-04-09 DIAGNOSIS — K828 Other specified diseases of gallbladder: Secondary | ICD-10-CM | POA: Diagnosis not present

## 2023-04-09 NOTE — Telephone Encounter (Signed)
 Copied from CRM [id number]. Topic: Clinical - Lab/Test Results >> Apr 09, 2023 12:16 PM Irine Seal wrote: Reason for CRM: Pts wife evelyn, wanting to speak regarding lab results callback: [phone]

## 2023-04-09 NOTE — Telephone Encounter (Signed)
 Pts wife has stated the [place] of abdomen was sent to MyChart... Pts wife states pt is still not wanting to eat, he states he feels bad and having weakness.  Pts wife would like advice on what suggestions can the provider give for pt.

## 2023-04-11 NOTE — Telephone Encounter (Signed)
 Please see my comments to his ultrasound report.  Hopefully he is started to feel better after a few days of being off Zetia. Let me know how he is doing on Monday.  Thank you

## 2023-04-12 ENCOUNTER — Encounter: Payer: Self-pay | Admitting: Internal Medicine

## 2023-04-13 NOTE — Telephone Encounter (Signed)
 Spoke with the pts she has stated pt is doing better.

## 2023-04-23 ENCOUNTER — Ambulatory Visit: Payer: PPO | Admitting: Cardiology

## 2023-04-27 ENCOUNTER — Ambulatory Visit (INDEPENDENT_AMBULATORY_CARE_PROVIDER_SITE_OTHER): Payer: PPO | Admitting: Internal Medicine

## 2023-04-27 ENCOUNTER — Encounter: Payer: Self-pay | Admitting: Internal Medicine

## 2023-04-27 VITALS — BP 120/62 | HR 50 | Temp 98.5°F | Ht 72.0 in | Wt 196.0 lb

## 2023-04-27 DIAGNOSIS — M6281 Muscle weakness (generalized): Secondary | ICD-10-CM

## 2023-04-27 DIAGNOSIS — I10 Essential (primary) hypertension: Secondary | ICD-10-CM | POA: Diagnosis not present

## 2023-04-27 DIAGNOSIS — E119 Type 2 diabetes mellitus without complications: Secondary | ICD-10-CM

## 2023-04-27 DIAGNOSIS — R29898 Other symptoms and signs involving the musculoskeletal system: Secondary | ICD-10-CM | POA: Diagnosis not present

## 2023-04-27 DIAGNOSIS — L97521 Non-pressure chronic ulcer of other part of left foot limited to breakdown of skin: Secondary | ICD-10-CM | POA: Diagnosis not present

## 2023-04-27 DIAGNOSIS — L97509 Non-pressure chronic ulcer of other part of unspecified foot with unspecified severity: Secondary | ICD-10-CM | POA: Insufficient documentation

## 2023-04-27 DIAGNOSIS — E538 Deficiency of other specified B group vitamins: Secondary | ICD-10-CM | POA: Diagnosis not present

## 2023-04-27 NOTE — Assessment & Plan Note (Signed)
L big toe medial ulcer 6x6 mm Warm [place] if not better

## 2023-04-27 NOTE — Progress Notes (Signed)
Subjective:  Patient ID: Vernon Bishop, male    DOB: 04/03/1940  Age: 83 y.o. MRN: [id number]  CC: Nausea (Follow up regarding nausea/vertigo. Patient has past history of vertigo and noted after eating lunch having an episode that lasted several minutes. Notes that symptoms have subsided a bit and that symptoms are better when using their walker.)   HPI Vernon Bishop presents for weak episode. He was not eating for 3 d prior, wife was out o town. Labs and abd [place] was ok.  Outpatient Medications Prior to Visit  Medication Sig Dispense Refill   acetaminophen (TYLENOL) 325 MG tablet Take 325-650 mg by mouth every 6 (six) hours as needed for mild pain or headache.     amLODipine (NORVASC) 5 MG tablet Take 1 tablet (5 mg total) by mouth daily. 90 tablet 3   apixaban (ELIQUIS) 5 MG TABS tablet Take 1 tablet (5 mg total) by mouth 2 (two) times daily. 60 tablet 5   Ascorbic Acid (C-500/ROSE HIPS PO) Take by mouth.     chlorhexidine (HIBICLENS) 4 % external liquid Apply topically daily as needed. Use 1-2 times in the shower 500 mL 0   Cholecalciferol (VITAMIN D3 PO) Take 1 tablet by mouth daily.     clotrimazole-betamethasone (LOTRISONE) cream Apply 1 Application topically 2 (two) times daily. 45 g 1   Cyanocobalamin (VITAMIN B-12 CR) 1000 MCG TBCR Take 1,000 mcg by mouth daily.       FLUoxetine (PROZAC) 40 MG capsule TAKE 1 CAPSULE (40 MG TOTAL) BY MOUTH DAILY. 90 capsule 2   furosemide (LASIX) 20 MG tablet TAKE 1 TABLET (20 MG TOTAL) BY MOUTH DAILY. 30 tablet 3   gabapentin (NEURONTIN) 300 MG capsule TAKE 1 CAPSULE (300 MG TOTAL) BY MOUTH 3 (THREE) TIMES DAILY. 270 capsule 3   ketoconazole (NIZORAL) 2 % cream Apply 1 Application topically daily. 45 g 1   Multiple Vitamin (MULTIVITAMIN WITH MINERALS) TABS tablet Take 1 tablet by mouth daily.     mupirocin ointment (BACTROBAN) 2 % On leg wound w/dressing change qd or bid 30 g 0   olmesartan-hydrochlorothiazide (BENICAR HCT) 20-12.5 MG tablet  TAKE 1 TABLET BY MOUTH DAILY. 90 tablet 3   pantoprazole (PROTONIX) 40 MG tablet Take 1 tablet (40 mg total) by mouth daily. 30 tablet 3   PRESCRIPTION MEDICATION CPAP- At bedtime     propranolol ER (INDERAL LA) 80 MG 24 hr capsule Take 1 capsule (80 mg total) by mouth daily. 90 capsule 3   Saw Palmetto, Serenoa repens, (SAW PALMETTO PO) Take 1 capsule by mouth daily.     sodium chloride (OCEAN) 0.65 % SOLN nasal spray Place 1 spray into both nostrils as needed for congestion.     tetrahydrozoline 0.05 % ophthalmic solution Place 1 drop into both eyes 2 (two) times daily as needed (for redness).     traZODone (DESYREL) 50 MG tablet TAKE 1 TO 2 TABLETS BY MOUTH AT BEDTIME. 60 tablet 5   triamcinolone ointment (KENALOG) 0.1 % Apply 1 Application topically 4 (four) times daily. (Patient taking differently: Apply 1 Application topically daily as needed (irritation).) 160 g 2   zinc gluconate 50 MG tablet Take 50 mg by mouth daily.     methocarbamol (ROBAXIN) 500 MG tablet Take 1 tablet (500 mg total) by mouth every 6 (six) hours as needed for muscle spasms. 30 tablet 1   ezetimibe (ZETIA) 10 MG tablet Take 1 tablet (10 mg total) by mouth daily. 30 tablet  3   No facility-administered medications prior to visit.    ROS: Review of Systems  Constitutional:  Positive for fatigue and unexpected weight change. Negative for appetite change.  HENT:  Negative for congestion, nosebleeds, sneezing, sore throat and trouble swallowing.   Eyes:  Negative for itching and visual disturbance.  Respiratory:  Negative for cough.   Cardiovascular:  Negative for chest pain, palpitations and leg swelling.  Gastrointestinal:  Negative for abdominal distention, blood in stool, diarrhea and nausea.  Genitourinary:  Negative for frequency and hematuria.  Musculoskeletal:  Positive for back pain. Negative for gait problem, joint swelling and neck pain.  Skin:  Negative for rash.  Neurological:  Negative for dizziness,  tremors, speech difficulty and weakness.  Psychiatric/Behavioral:  Negative for agitation, dysphoric mood and sleep disturbance. The patient is not nervous/anxious.     Objective:  BP 120/62   Pulse (!) 50   Temp 98.5 F (36.9 C)   Ht 6' (1.829 m)   Wt 196 lb (88.9 kg)   SpO2 97%   BMI 26.58 kg/m   BP Readings from Last 3 Encounters:  04/27/23 120/62  04/07/23 128/82  02/15/23 (!) 140/60    Wt Readings from Last 3 Encounters:  04/27/23 196 lb (88.9 kg)  04/07/23 202 lb (91.6 kg)  02/15/23 208 lb (94.3 kg)    Physical Exam Constitutional:      General: He is not in acute distress.    Appearance: Normal appearance. He is well-developed.     Comments: NAD  Eyes:     Conjunctiva/sclera: Conjunctivae normal.     Pupils: Pupils are equal, round, and reactive to light.  Neck:     Thyroid: No thyromegaly.     Vascular: No JVD.  Cardiovascular:     Rate and Rhythm: Normal rate and regular rhythm.     Heart sounds: Normal heart sounds. No murmur heard.    No friction rub. No gallop.  Pulmonary:     Effort: Pulmonary effort is normal. No respiratory distress.     Breath sounds: Normal breath sounds. No wheezing or rales.  Chest:     Chest wall: No tenderness.  Abdominal:     General: Bowel sounds are normal. There is no distension.     Palpations: Abdomen is soft. There is no mass.     Tenderness: There is no abdominal tenderness. There is no guarding or rebound.  Musculoskeletal:        General: No tenderness. Normal range of motion.     Cervical back: Normal range of motion.     Right lower leg: No edema.     Left lower leg: No edema.  Lymphadenopathy:     Cervical: No cervical adenopathy.  Skin:    General: Skin is warm and dry.     Findings: No rash.  Neurological:     Mental Status: He is alert and oriented to person, place, and time.     Cranial Nerves: No cranial nerve deficit.     Motor: No abnormal muscle tone.     Coordination: Coordination normal.      Gait: Gait normal.     Deep Tendon Reflexes: Reflexes are normal and symmetric.  Psychiatric:        Behavior: Behavior normal.        Thought Content: Thought content normal.        Judgment: Judgment normal.   Looks better Using a walker  L big toe medial ulcer 6x6 mm Warm  Lab Results  Component Value Date   WBC 8.2 04/07/2023   HGB 13.2 04/07/2023   HCT 39.2 04/07/2023   PLT 225.0 04/07/2023   GLUCOSE 91 04/07/2023   CHOL 119 05/20/2022   TRIG 102 05/20/2022   HDL 35 (L) 05/20/2022   LDLCALC 65 05/20/2022   ALT 21 04/07/2023   AST 22 04/07/2023   NA 134 (L) 04/07/2023   K 4.1 04/07/2023   CL 99 04/07/2023   CREATININE 0.67 04/07/2023   BUN 20 04/07/2023   CO2 22 04/07/2023   TSH 2.86 04/07/2023   PSA 0.73 12/20/2019   INR 1.2 04/22/2022   HGBA1C 5.0 06/09/2021   MICROALBUR 3.9 12/20/2019    US Abdomen Complete Result Date: 04/09/2023 CLINICAL DATA:  Nausea, elevated lipase, post cholecystectomy EXAM: ABDOMEN ULTRASOUND COMPLETE COMPARISON:  None Available. FINDINGS: Gallbladder: Surgically absent Common bile duct: Diameter: 8.6 mm. No intrahepatic biliary ductal dilatation. Liver: No focal lesion identified. Within normal limits in parenchymal echogenicity. Portal vein is patent on color Doppler imaging with normal direction of blood flow towards the liver. IVC: No abnormality visualized. Pancreas: Visualized segments unremarkable, portions obscured by overlying bowel gas. Spleen: Normal size.  Punctate  echogenic foci possibly granulomas. Right Kidney: Length: 12.1 cm. Echogenicity within normal limits. No mass or hydronephrosis visualized. Left Kidney: Length: 12.8 cm. Echogenicity within normal limits. No mass or hydronephrosis visualized. Abdominal aorta: No aneurysm visualized. Other findings: No ascites. IMPRESSION: 1. No acute findings. 2. Mildly dilated common bile duct, likely postcholecystectomy reservoir effect. Electronically Signed   By: Corlis Leak M.D.    On: 04/09/2023 09:58    Assessment & Plan:   Problem List Items Addressed This Visit     Diabetes mellitus type 2, diet-controlled (HCC)   Vitamin B12 deficiency   Check B12 Continue with vitamin B12      Essential hypertension   Cont w/Lotensin HCT, Amlodipine, Inderal      Leg weakness, bilateral - Primary   He was not eating for 3 d prior, wife was out o town. Resolved after he started to eat.      Foot ulcer (HCC)   L big toe medial ulcer 6x6 mm Warm [place] if not better      Muscle weakness      No orders of the defined types were placed in this encounter.     Follow-up: Return in about 3 months (around 07/26/2023) for a follow-up visit.  Sonda Primes, MD

## 2023-05-03 DIAGNOSIS — M6281 Muscle weakness (generalized): Secondary | ICD-10-CM | POA: Insufficient documentation

## 2023-05-03 NOTE — Assessment & Plan Note (Signed)
Cont w/Lotensin HCT, Amlodipine, Inderal

## 2023-05-03 NOTE — Assessment & Plan Note (Signed)
He was not eating for 3 d prior, wife was out o town. Resolved after he started to eat.

## 2023-05-03 NOTE — Assessment & Plan Note (Signed)
Check B12 Continue with vitamin B12

## 2023-05-24 DIAGNOSIS — Z981 Arthrodesis status: Secondary | ICD-10-CM | POA: Diagnosis not present

## 2023-05-24 DIAGNOSIS — M5414 Radiculopathy, thoracic region: Secondary | ICD-10-CM | POA: Diagnosis not present

## 2023-05-24 DIAGNOSIS — Z79899 Other long term (current) drug therapy: Secondary | ICD-10-CM | POA: Diagnosis not present

## 2023-05-24 DIAGNOSIS — R269 Unspecified abnormalities of gait and mobility: Secondary | ICD-10-CM | POA: Diagnosis not present

## 2023-05-24 DIAGNOSIS — M5136 Other intervertebral disc degeneration, lumbar region with discogenic back pain only: Secondary | ICD-10-CM | POA: Diagnosis not present

## 2023-05-24 DIAGNOSIS — G894 Chronic pain syndrome: Secondary | ICD-10-CM | POA: Diagnosis not present

## 2023-05-31 ENCOUNTER — Encounter: Payer: Self-pay | Admitting: Adult Health

## 2023-05-31 ENCOUNTER — Ambulatory Visit: Payer: PPO | Admitting: Adult Health

## 2023-05-31 VITALS — BP 122/60 | HR 46 | Temp 97.8°F | Ht 72.0 in | Wt 198.8 lb

## 2023-05-31 DIAGNOSIS — G4733 Obstructive sleep apnea (adult) (pediatric): Secondary | ICD-10-CM

## 2023-05-31 DIAGNOSIS — J310 Chronic rhinitis: Secondary | ICD-10-CM | POA: Diagnosis not present

## 2023-05-31 NOTE — Progress Notes (Signed)
 @Patient  ID: Vernon Bishop, male    DOB: [birth year], 83 y.o.   MRN: [id number]  Chief Complaint  Patient presents with   Follow-up    Referring provider: Tresa Garter, MD  HPI: 83 year old male followed for severe obstructive sleep apnea on CPAP Medical history significant for diabetes and hypertension, chronic back pain, A-fib, stroke  TEST/EVENTS :  NPSG 2002:  AHI 80/hr.  CPAP 12cm optimal   05/31/2023 Follow up : OSA  Patient returns for a 1 year follow-up.  Patient has severe obstructive sleep apnea.  Has been on CPAP for many years.  Says that around 4 months ago he started having significant sinus issues and stopped using his CPAP.  Sinus congestion improved but he did not restart CPAP.  Since stopping has not had much change in his sleeping pattern.  DME company is [employer].  Uses a nasal mask.  Patient education given on sleep apnea and potential complications of untreated sleep apnea. Patient says since last visit he has been doing fairly well.  Continues to have issues with balance uses a rolling walker.    Allergies  Allergen Reactions   Abilify [Aripiprazole] Shortness Of Breath    Made pt "feel crazy"   Cymbalta [Duloxetine Hcl] Other (See Comments)    Constipation    Digoxin And Related Other (See Comments)    HR 41 (patient is unsure about this)   Prednisolone Swelling and Other (See Comments)    Edema, weight gain   Temazepam Other (See Comments)    "Made me feel crazy"   Zanaflex [Tizanidine]     Low BP, speech dysturbance. Possible   Other Rash and Other (See Comments)    Zomax/Zomepirac- Welts, also    Immunization History  Administered Date(s) Administered   Fluad Quad(high Dose 65+) 11/22/2018, 12/20/2019   Influenza Split 01/28/2011, 12/18/2011   Influenza Whole 02/14/2007, 12/11/2009   Influenza, High Dose Seasonal PF 02/03/2016, 12/30/2016, 01/04/2018, 12/09/2020   Influenza,inj,Quad PF,6+ Mos 01/03/2013, 01/10/2014, 12/05/2014    PFIZER(Purple Top)SARS-COV-2 Vaccination 04/19/2019, 05/10/2019, 12/24/2019, 07/05/2020   PNEUMOCOCCAL CONJUGATE-20 08/30/2020   Pfizer Covid-19 Vaccine Bivalent Booster 66yrs & up 12/09/2020   Pneumococcal Conjugate-13 05/08/2013   Pneumococcal Polysaccharide-23 08/27/2009   Td 03/30/2008   Td (Adult), 2 Lf Tetanus Toxid, Preservative Free 03/30/2008   Zoster, Live 04/13/2014    Past Medical History:  Diagnosis Date   Arthralgia    Atrial fibrillation (HCC)    1992   Benign prostatic hyperplasia    Bradycardia    "my doctor says I have bradycardia, my heart rate is always in the 40s"   DDD (degenerative disc disease)    Depression    Dysrhythmia    aFIB   Dyssomnia    ED (erectile dysfunction)    Hyperlipidemia    Hypertension    Hypogonadism male    Joint pain    LBP (low back pain)    Left pontine stroke (HCC)    numbness on tongue and occasional slurred speech   Multiple actinic keratoses    Muscle weakness    Upper limb   MVP (mitral valve prolapse)    Neck pain    OSA on CPAP    cpap   Osteoarthritis of hip    Osteoarthritis, hand    SEVERE LOWER BACK PAIN, AND RESTLESS LEG SYNDROME   Otalgia of right ear    Pain in left knee    Pain in thoracic spine    Palpitations  PTSD (post-traumatic stress disorder)    Shoulder pain    Subjective visual disturbance    Swallowing problem    HX OF PILLS GETTING "STUCK" IN THROAT AT TIMES   Swelling    Vitamin D deficiency     Tobacco History: Social History   Tobacco Use  Smoking Status Former   Current packs/day: 0.00   Average packs/day: 0.5 packs/day for 12.0 years (6.0 ttl pk-yrs)   Types: Cigarettes   Start date: 03/31/1959   Quit date: 03/31/1971   Years since quitting: 52.2  Smokeless Tobacco Never  Tobacco Comments   Started at age 18   Counseling given: Not Answered Tobacco comments: Started at age 67   Outpatient Medications Prior to Visit  Medication Sig Dispense Refill   acetaminophen  (TYLENOL) 325 MG tablet Take 325-650 mg by mouth every 6 (six) hours as needed for mild pain or headache.     amLODipine (NORVASC) 5 MG tablet Take 1 tablet (5 mg total) by mouth daily. 90 tablet 3   apixaban (ELIQUIS) 5 MG TABS tablet Take 1 tablet (5 mg total) by mouth 2 (two) times daily. 60 tablet 5   Ascorbic Acid (C-500/ROSE HIPS PO) Take by mouth.     chlorhexidine (HIBICLENS) 4 % external liquid Apply topically daily as needed. Use 1-2 times in the shower 500 mL 0   Cholecalciferol (VITAMIN D3 PO) Take 1 tablet by mouth daily.     clotrimazole-betamethasone (LOTRISONE) cream Apply 1 Application topically 2 (two) times daily. 45 g 1   Cyanocobalamin (VITAMIN B-12 CR) 1000 MCG TBCR Take 1,000 mcg by mouth daily.       FLUoxetine (PROZAC) 40 MG capsule TAKE 1 CAPSULE (40 MG TOTAL) BY MOUTH DAILY. 90 capsule 2   furosemide (LASIX) 20 MG tablet TAKE 1 TABLET (20 MG TOTAL) BY MOUTH DAILY. 30 tablet 3   gabapentin (NEURONTIN) 300 MG capsule TAKE 1 CAPSULE (300 MG TOTAL) BY MOUTH 3 (THREE) TIMES DAILY. 270 capsule 3   ketoconazole (NIZORAL) 2 % cream Apply 1 Application topically daily. 45 g 1   Multiple Vitamin (MULTIVITAMIN WITH MINERALS) TABS tablet Take 1 tablet by mouth daily.     mupirocin ointment (BACTROBAN) 2 % On leg wound w/dressing change qd or bid 30 g 0   olmesartan-hydrochlorothiazide (BENICAR HCT) 20-12.5 MG tablet TAKE 1 TABLET BY MOUTH DAILY. 90 tablet 3   pantoprazole (PROTONIX) 40 MG tablet Take 1 tablet (40 mg total) by mouth daily. 30 tablet 3   PRESCRIPTION MEDICATION CPAP- At bedtime     propranolol ER (INDERAL LA) 80 MG 24 hr capsule Take 1 capsule (80 mg total) by mouth daily. 90 capsule 3   Saw Palmetto, Serenoa repens, (SAW PALMETTO PO) Take 1 capsule by mouth daily.     sodium chloride (OCEAN) 0.65 % SOLN nasal spray Place 1 spray into both nostrils as needed for congestion.     tetrahydrozoline 0.05 % ophthalmic solution Place 1 drop into both eyes 2 (two) times  daily as needed (for redness).     traZODone (DESYREL) 50 MG tablet TAKE 1 TO 2 TABLETS BY MOUTH AT BEDTIME. 60 tablet 5   triamcinolone ointment (KENALOG) 0.1 % Apply 1 Application topically 4 (four) times daily. (Patient taking differently: Apply 1 Application topically daily as needed (irritation).) 160 g 2   zinc gluconate 50 MG tablet Take 50 mg by mouth daily.     ezetimibe (ZETIA) 10 MG tablet Take 1 tablet (10 mg total) by  mouth daily. 30 tablet 3   No facility-administered medications prior to visit.     Review of Systems:   Constitutional:   No  weight loss, night sweats,  Fevers, chills, +fatigue, or  lassitude.  HEENT:   No headaches,  Difficulty swallowing,  Tooth/dental problems, or  Sore throat,                No sneezing, itching, ear ache, nasal congestion, post nasal drip,   CV:  No chest pain,  Orthopnea, PND, swelling in lower extremities, anasarca, dizziness, palpitations, syncope.   GI  No heartburn, indigestion, abdominal pain, nausea, vomiting, diarrhea, change in bowel habits, loss of appetite, bloody stools.   Resp: .  No chest wall deformity  Skin: no rash or lesions.  GU: no dysuria, change in color of urine, no urgency or frequency.  No flank pain, no hematuria   MS:  Chronic back pain    Physical Exam  BP 122/60 (BP Location: Left Arm, Patient Position: Sitting)   Pulse (!) 46   Temp 97.8 F (36.6 C) (Oral)   Ht 6' (1.829 m)   Wt 198 lb 12.8 oz (90.2 kg)   SpO2 97%   BMI 26.96 kg/m   GEN: A/Ox3; pleasant , NAD, well nourished    HEENT:  [place]/AT,  NOSE-clear, THROAT-clear, no lesions, no postnasal drip or exudate noted.  Class III MP airway  NECK:  Supple w/ fair ROM; no JVD; normal carotid impulses w/o bruits; no thyromegaly or nodules palpated; no lymphadenopathy.    RESP  Clear  P & A; w/o, wheezes/ rales/ or rhonchi. no accessory muscle use, no dullness to percussion  CARD:  RRR, no m/r/g, no peripheral edema, pulses intact, no  cyanosis or clubbing.  GI:   Soft & nt; nml bowel sounds; no organomegaly or masses detected.   Musco: Warm bil, no deformities or joint swelling noted.   Neuro: alert, no focal deficits noted.    Skin: Warm, no lesions or rashes    Lab Results:  CBC    Component Value Date/Time   WBC 8.2 04/07/2023 1634   RBC 4.31 04/07/2023 1634   HGB 13.2 04/07/2023 1634   HCT 39.2 04/07/2023 1634   PLT 225.0 04/07/2023 1634   MCV 90.7 04/07/2023 1634   MCH 30.5 09/08/2022 1420   MCHC 33.7 04/07/2023 1634   RDW 13.4 04/07/2023 1634   LYMPHSABS 1.4 04/07/2023 1634   MONOABS 0.5 04/07/2023 1634   EOSABS 0.0 04/07/2023 1634   BASOSABS 0.1 04/07/2023 1634    BMET  Imaging: No results found.  Administration History     None           No data to display          No results found for: "NITRICOXIDE"      Assessment & Plan:   OSA on CPAP Severe obstructive sleep apnea.  Patient had difficulty tolerating CPAP recently with nasal congestion.  Symptoms have we discussed restarting CPAP.  Encouraged to wear for at least 6 or more hours each night.  Advised if unable to tolerate to give our office a call back.  Would recommend a repeat sleep study to evaluate severity of sleep apnea - discussed how weight can impact sleep and risk for sleep disordered breathing - discussed options to assist with weight loss: combination of diet modification, cardiovascular and strength training exercises   - had an extensive discussion regarding the adverse health consequences related to untreated sleep  disordered breathing - specifically discussed the risks for hypertension, coronary artery disease, cardiac dysrhythmias, cerebrovascular disease, and diabetes - lifestyle modification discussed   - discussed how sleep disruption can increase risk of accidents, particularly when driving - safe driving practices were discussed     Plan  Patient Instructions  Restart CPAP At bedtime, wear  all night long for at least 6hr or more.  Begin saline nasal spray Twice daily   Begin saline nasal gel At bedtime  (AYR)  Work on weight loss.  Do not drive if sleepy.  Follow up in 3 months and As needed        Chronic rhinitis Symptoms seem to be improved.  Advised to begin saline nasal spray twice daily and saline nasal gel at bedtime to help with nasal stuffiness that can be secondary to CPAP usage.    Rubye Oaks, NP 05/31/2023

## 2023-05-31 NOTE — Assessment & Plan Note (Signed)
 Symptoms seem to be improved.  Advised to begin saline nasal spray twice daily and saline nasal gel at bedtime to help with nasal stuffiness that can be secondary to CPAP usage.

## 2023-05-31 NOTE — Assessment & Plan Note (Addendum)
 Severe obstructive sleep apnea.  Patient had difficulty tolerating CPAP recently with nasal congestion.  Symptoms have we discussed restarting CPAP.  Encouraged to wear for at least 6 or more hours each night.  Advised if unable to tolerate to give our office a call back.  Would recommend a repeat sleep study to evaluate severity of sleep apnea - discussed how weight can impact sleep and risk for sleep disordered breathing - discussed options to assist with weight loss: combination of diet modification, cardiovascular and strength training exercises   - had an extensive discussion regarding the adverse health consequences related to untreated sleep disordered breathing - specifically discussed the risks for hypertension, coronary artery disease, cardiac dysrhythmias, cerebrovascular disease, and diabetes - lifestyle modification discussed   - discussed how sleep disruption can increase risk of accidents, particularly when driving - safe driving practices were discussed     Plan  Patient Instructions  Restart CPAP At bedtime, wear all night long for at least 6hr or more.  Begin saline nasal spray Twice daily   Begin saline nasal gel At bedtime  (AYR)  Work on weight loss.  Do not drive if sleepy.  Follow up in 3 months and As needed

## 2023-05-31 NOTE — Patient Instructions (Addendum)
 Restart CPAP At bedtime, wear all night long for at least 6hr or more.  Begin saline nasal spray Twice daily   Begin saline nasal gel At bedtime  (AYR)  Work on weight loss.  Do not drive if sleepy.  Follow up in 3 months and As needed

## 2023-06-04 ENCOUNTER — Other Ambulatory Visit: Payer: Self-pay | Admitting: Internal Medicine

## 2023-06-16 ENCOUNTER — Ambulatory Visit: Payer: PPO | Admitting: Internal Medicine

## 2023-06-26 ENCOUNTER — Other Ambulatory Visit: Payer: Self-pay | Admitting: Internal Medicine

## 2023-07-27 ENCOUNTER — Ambulatory Visit (INDEPENDENT_AMBULATORY_CARE_PROVIDER_SITE_OTHER): Payer: PPO | Admitting: Internal Medicine

## 2023-07-27 ENCOUNTER — Encounter: Payer: Self-pay | Admitting: Internal Medicine

## 2023-07-27 VITALS — BP 126/68 | HR 45 | Temp 97.9°F | Ht 72.0 in | Wt 191.2 lb

## 2023-07-27 DIAGNOSIS — M5440 Lumbago with sciatica, unspecified side: Secondary | ICD-10-CM | POA: Diagnosis not present

## 2023-07-27 DIAGNOSIS — E119 Type 2 diabetes mellitus without complications: Secondary | ICD-10-CM | POA: Diagnosis not present

## 2023-07-27 DIAGNOSIS — F339 Major depressive disorder, recurrent, unspecified: Secondary | ICD-10-CM | POA: Diagnosis not present

## 2023-07-27 DIAGNOSIS — E538 Deficiency of other specified B group vitamins: Secondary | ICD-10-CM

## 2023-07-27 LAB — COMPREHENSIVE METABOLIC PANEL WITH GFR
ALT: 13 U/L (ref 0–53)
AST: 19 U/L (ref 0–37)
Albumin: 4.2 g/dL (ref 3.5–5.2)
Alkaline Phosphatase: 81 U/L (ref 39–117)
BUN: 15 mg/dL (ref 6–23)
CO2: 32 meq/L (ref 19–32)
Calcium: 9.5 mg/dL (ref 8.4–10.5)
Chloride: 100 meq/L (ref 96–112)
Creatinine, Ser: 0.81 mg/dL (ref 0.40–1.50)
GFR: 82.02 mL/min (ref >60.00)
Glucose, Bld: 84 mg/dL (ref 70–99)
Potassium: 4.3 meq/L (ref 3.5–5.1)
Sodium: 136 meq/L (ref 135–145)
Total Bilirubin: 0.5 mg/dL (ref 0.2–1.2)
Total Protein: 6.8 g/dL (ref 6.0–8.3)

## 2023-07-27 LAB — HEMOGLOBIN A1C: Hgb A1c MFr Bld: 4.9 % (ref 4.6–6.5)

## 2023-07-27 LAB — TSH: TSH: 3.48 u[IU]/mL (ref 0.35–5.50)

## 2023-07-27 MED ORDER — OXYCODONE HCL 10 MG PO TABS: 10.0000 mg | ORAL_TABLET | Freq: Four times a day (QID) | ORAL | Status: AC | PRN

## 2023-07-27 MED ORDER — FLUOXETINE HCL 40 MG PO CAPS: 40.0000 mg | ORAL_CAPSULE | Freq: Every day | ORAL | 2 refills | Status: DC

## 2023-07-27 NOTE — Progress Notes (Signed)
 Subjective:  Patient ID: Vernon Bishop, male    DOB: Aug 23, 1940  Age: 83 y.o. MRN: [id number]  CC: Medical Management of Chronic Issues (3 Month Follow Up. Left knee pain, bone on bone, notes audible clicking when walking at times. 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 Needs refill on fluoxetine )   HPI Vernon Bishop presents for Left knee pain, bone on bone, notes audible clicking when walking at times. 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 Needs refill on fluoxetine  for depression. No syncope. Eating good F/u on DM   Outpatient Medications Prior to Visit  Medication Sig Dispense Refill   acetaminophen  (TYLENOL ) 325 MG tablet Take 325-650 mg by mouth every 6 (six) hours as needed for mild pain or headache.     amLODipine  (NORVASC ) 5 MG tablet Take 1 tablet (5 mg total) by mouth daily. 90 tablet 3   apixaban  (ELIQUIS ) 5 MG TABS tablet Take 1 tablet (5 mg total) by mouth 2 (two) times daily. 60 tablet 5   Ascorbic Acid (C-500/ROSE HIPS PO) Take by mouth.     chlorhexidine  (HIBICLENS ) 4 % external liquid Apply topically daily as needed. Use 1-2 times in the shower 500 mL 0   Cholecalciferol  (VITAMIN D3 PO) Take 1 tablet by mouth daily.     clotrimazole -betamethasone  (LOTRISONE ) cream APPLY TO AFFECTED AREA ON THE SKIN 2 TIMES A DAY 45 g 2   Cyanocobalamin  (VITAMIN B-12 CR) 1000 MCG TBCR Take 1,000 mcg by mouth daily.       furosemide  (LASIX ) 20 MG tablet TAKE 1 TABLET (20 MG TOTAL) BY MOUTH DAILY. 30 tablet 3   gabapentin  (NEURONTIN ) 300 MG capsule TAKE 1 CAPSULE (300 MG TOTAL) BY MOUTH 3 (THREE) TIMES DAILY. 270 capsule 3   ketoconazole  (NIZORAL ) 2 % cream Apply 1 Application topically daily. 45 g 1   Multiple Vitamin (MULTIVITAMIN WITH MINERALS) TABS tablet Take 1 tablet by mouth daily.     mupirocin  ointment (BACTROBAN ) 2 % On leg wound w/dressing change qd or  bid 30 g 0   olmesartan -hydrochlorothiazide  (BENICAR  HCT) 20-12.5 MG tablet TAKE 1 TABLET BY MOUTH DAILY. 90 tablet 3   pantoprazole  (PROTONIX ) 40 MG tablet Take 1 tablet (40 mg total) by mouth daily. 30 tablet 3   PRESCRIPTION MEDICATION CPAP- At bedtime     propranolol  ER (INDERAL  LA) 80 MG 24 hr capsule TAKE 1 CAPSULE (80 MG TOTAL) BY MOUTH DAILY. 90 capsule 4   Saw Palmetto, Serenoa repens, (SAW PALMETTO PO) Take 1 capsule by mouth daily.     sodium chloride  (OCEAN) 0.65 % SOLN nasal spray Place 1 spray into both nostrils as needed for congestion.     tetrahydrozoline 0.05 % ophthalmic solution Place 1 drop into both eyes 2 (two) times daily as needed (for redness).     traZODone  (DESYREL ) 50 MG tablet TAKE 1 TO 2 TABLETS BY MOUTH AT BEDTIME. 60 tablet 5   triamcinolone  ointment (KENALOG ) 0.1 % Apply 1 Application topically 4 (four) times daily. (Patient taking differently: Apply 1 Application topically daily as needed (irritation).) 160 g 2   zinc  gluconate 50 MG tablet Take 50 mg by mouth daily.     FLUoxetine  (PROZAC ) 40 MG capsule TAKE 1 CAPSULE (40 MG TOTAL) BY MOUTH DAILY. 90 capsule 2   ezetimibe  (ZETIA ) 10 MG tablet Take 1 tablet (10 mg total)  by mouth daily. 30 tablet 3   No facility-administered medications prior to visit.    ROS: Review of Systems  Constitutional:  Positive for fatigue. Negative for appetite change and unexpected weight change.  HENT:  Negative for congestion, nosebleeds, sneezing, sore throat and trouble swallowing.   Eyes:  Negative for itching and visual disturbance.  Respiratory:  Negative for cough.   Cardiovascular:  Negative for chest pain, palpitations and leg swelling.  Gastrointestinal:  Negative for abdominal distention, blood in stool, diarrhea and nausea.  Genitourinary:  Negative for frequency and hematuria.  Musculoskeletal:  Positive for back pain and gait problem. Negative for joint swelling and neck pain.  Skin:  Negative for rash.   Neurological:  Positive for weakness. Negative for dizziness, tremors, speech difficulty and light-headedness.  Hematological:  Does not bruise/bleed easily.  Psychiatric/Behavioral:  Positive for decreased concentration. Negative for agitation, dysphoric mood, sleep disturbance and suicidal ideas. The patient is not nervous/anxious.     Objective:  BP 126/68   Pulse (!) 45   Temp 97.9 F (36.6 C)   Ht 6' (1.829 m)   Wt 191 lb 3.2 oz (86.7 kg)   SpO2 99%   BMI 25.93 kg/m   BP Readings from Last 3 Encounters:  07/27/23 126/68  05/31/23 122/60  04/27/23 120/62    Wt Readings from Last 3 Encounters:  07/27/23 191 lb 3.2 oz (86.7 kg)  05/31/23 198 lb 12.8 oz (90.2 kg)  04/27/23 196 lb (88.9 kg)    Physical Exam Constitutional:      General: He is not in acute distress.    Appearance: He is well-developed. He is obese.     Comments: NAD  Eyes:     Conjunctiva/sclera: Conjunctivae normal.     Pupils: Pupils are equal, round, and reactive to light.  Neck:     Thyroid : No thyromegaly.     Vascular: No JVD.  Cardiovascular:     Rate and Rhythm: Normal rate and regular rhythm.     Heart sounds: Normal heart sounds. No murmur heard.    No friction rub. No gallop.  Pulmonary:     Effort: Pulmonary effort is normal. No respiratory distress.     Breath sounds: Normal breath sounds. No wheezing or rales.  Chest:     Chest wall: No tenderness.  Abdominal:     General: Bowel sounds are normal. There is no distension.     Palpations: Abdomen is soft. There is no mass.     Tenderness: There is no abdominal tenderness. There is no guarding or rebound.  Musculoskeletal:        General: No tenderness. Normal range of motion.     Cervical back: Normal range of motion.  Lymphadenopathy:     Cervical: No cervical adenopathy.  Skin:    General: Skin is warm and dry.     Findings: No rash.  Neurological:     Mental Status: He is alert and oriented to person, place, and time.      Cranial Nerves: No cranial nerve deficit.     Motor: No abnormal muscle tone.     Coordination: Coordination normal.     Gait: Gait abnormal.     Deep Tendon Reflexes: Reflexes are normal and symmetric.  Psychiatric:        Behavior: Behavior normal.        Thought Content: Thought content normal.        Judgment: Judgment normal.   Using a walker  Lab Results  Component Value Date   WBC 8.2 04/07/2023   HGB 13.2 04/07/2023   HCT 39.2 04/07/2023   PLT 225.0 04/07/2023   GLUCOSE 91 04/07/2023   CHOL 119 05/20/2022   TRIG 102 05/20/2022   HDL 35 (L) 05/20/2022   LDLCALC 65 05/20/2022   ALT 21 04/07/2023   AST 22 04/07/2023   NA 134 (L) 04/07/2023   K 4.1 04/07/2023   CL 99 04/07/2023   CREATININE 0.67 04/07/2023   BUN 20 04/07/2023   CO2 22 04/07/2023   TSH 2.86 04/07/2023   PSA 0.73 12/20/2019   INR 1.2 04/22/2022   HGBA1C 5.0 06/09/2021   MICROALBUR 3.9 12/20/2019    US  Abdomen Complete Result Date: 04/09/2023 CLINICAL DATA:  Nausea, elevated lipase, post cholecystectomy EXAM: ABDOMEN ULTRASOUND COMPLETE COMPARISON:  None Available. FINDINGS: Gallbladder: Surgically absent Common bile duct: Diameter: 8.6 mm. No intrahepatic biliary ductal dilatation. Liver: No focal lesion identified. Within normal limits in parenchymal echogenicity. Portal vein is patent on color Doppler imaging with normal direction of blood flow towards the liver. IVC: No abnormality visualized. Pancreas: Visualized segments unremarkable, portions obscured by overlying bowel gas. Spleen: Normal size.  Punctate  echogenic foci possibly granulomas. Right Kidney: Length: 12.1 cm. Echogenicity within normal limits. No mass or hydronephrosis visualized. Left Kidney: Length: 12.8 cm. Echogenicity within normal limits. No mass or hydronephrosis visualized. Abdominal aorta: No aneurysm visualized. Other findings: No ascites. IMPRESSION: 1. No acute findings. 2. Mildly dilated common bile duct, likely  postcholecystectomy reservoir effect. Electronically Signed   By: Nicoletta Barrier M.D.   On: 04/09/2023 09:58    Assessment & Plan:   Problem List Items Addressed This Visit     Diabetes mellitus type 2, diet-controlled (HCC) - Primary    Pt lost wt on diet  before, stable wt now      Relevant Orders   Comprehensive metabolic panel with GFR   Hemoglobin A1c   TSH   Vitamin B12 deficiency   On Vit B12      Depression   On Fluoxetine  40 mg, Trazodone  at hs       Relevant Medications   FLUoxetine  (PROZAC ) 40 MG capsule   LOW BACK PAIN   On Oxy 10 mg qid per Dr Rexanne Catalina      Relevant Medications   Oxycodone  HCl 10 MG TABS      Meds ordered this encounter  Medications   FLUoxetine  (PROZAC ) 40 MG capsule    Sig: Take 1 capsule (40 mg total) by mouth daily.    Dispense:  90 capsule    Refill:  2   Oxycodone  HCl 10 MG TABS    Sig: Take 1 tablet (10 mg total) by mouth every 6 (six) hours as needed. Per Dr Rexanne Catalina      Follow-up: Return for a follow-up visit.  Anitra Barn, MD

## 2023-07-27 NOTE — Assessment & Plan Note (Signed)
Pt lost wt on diet  before, stable wt now

## 2023-07-27 NOTE — Assessment & Plan Note (Signed)
 On Oxy 10 mg qid per Dr Rexanne Catalina

## 2023-07-27 NOTE — Assessment & Plan Note (Signed)
On Fluoxetine 40 mg, Trazodone at hs

## 2023-07-27 NOTE — Assessment & Plan Note (Signed)
On Vit B12 

## 2023-07-29 ENCOUNTER — Encounter: Payer: Self-pay | Admitting: Internal Medicine

## 2023-08-31 ENCOUNTER — Ambulatory Visit: Admitting: Adult Health

## 2023-09-03 ENCOUNTER — Other Ambulatory Visit: Payer: Self-pay | Admitting: Internal Medicine

## 2023-09-08 ENCOUNTER — Other Ambulatory Visit: Payer: Self-pay | Admitting: Internal Medicine

## 2023-09-15 ENCOUNTER — Other Ambulatory Visit: Payer: Self-pay | Admitting: Internal Medicine

## 2023-09-24 ENCOUNTER — Ambulatory Visit (INDEPENDENT_AMBULATORY_CARE_PROVIDER_SITE_OTHER)

## 2023-09-24 VITALS — Ht 72.0 in | Wt 183.0 lb

## 2023-09-24 DIAGNOSIS — Z0000 Encounter for general adult medical examination without abnormal findings: Secondary | ICD-10-CM | POA: Diagnosis not present

## 2023-09-24 DIAGNOSIS — E119 Type 2 diabetes mellitus without complications: Secondary | ICD-10-CM | POA: Diagnosis not present

## 2023-09-24 NOTE — Patient Instructions (Addendum)
 Vernon Bishop , Thank you for taking time out of your busy schedule to complete your Annual Wellness Visit with me. I enjoyed our conversation and look forward to speaking with you again next year. I, as well as your care team,  appreciate your ongoing commitment to your health goals. Please review the following plan we discussed and let me know if I can assist you in the future. Your Game plan/ To Do List   Follow up Visits: Next Medicare AWV with our clinical staff: 09/25/2024   Have you seen your provider in the last 6 months (3 months if uncontrolled diabetes)? No Next Office Visit with your provider: 10/27/2023  Clinician Recommendations:  Aim for 30 minutes of exercise or brisk walking, 6-8 glasses of water , and 5 servings of fruits and vegetables each day. Educated and advised on getting the Tdap (Tetenus) vaccine in 2025 at local pharmacy.      This is a list of the screening recommended for you and due dates:  Health Maintenance  Topic Date Due   Eye exam for diabetics  12/28/2017   DTaP/Tdap/Td vaccine (3 - Tdap) 03/30/2018   Complete foot exam   09/20/2019   Yearly kidney health urinalysis for diabetes  12/19/2020   COVID-19 Vaccine (6 - 2024-25 season) 11/29/2022   Flu Shot  10/29/2023   Hemoglobin A1C  01/26/2024   Yearly kidney function blood test for diabetes  07/26/2024   Medicare Annual Wellness Visit  09/23/2024   Pneumococcal Vaccine for age over 8  Completed   Hepatitis B Vaccine  Aged Out   HPV Vaccine  Aged Out   Meningitis B Vaccine  Aged Out   Zoster (Shingles) Vaccine  Discontinued    Advanced directives: (Copy Requested) Please bring a copy of your health care power of attorney and living will to the office to be added to your chart at your convenience. You can mail to [facility] [address] [place], [place] [address] or email to [email or web address] Advance Care Planning is important because it:  [x]  Makes sure you receive the  medical care that is consistent with your values, goals, and preferences  [x]  It provides guidance to your family and loved ones and reduces their decisional burden about whether or not they are making the right decisions based on your wishes.  Follow the link provided in your after visit summary or read over the paperwork we have mailed to you to help you started getting your Advance Directives in place. If you need assistance in completing these, please reach out to us  so that we can help you!   Managing Pain Without Opioids Opioids are strong medicines used to treat moderate to severe pain. For some people, especially those who have long-term (chronic) pain, opioids may not be the best choice for pain management due to: Side effects like nausea, constipation, and sleepiness. The risk of addiction (opioid use disorder). The longer you take opioids, the greater your risk of addiction. Pain that lasts for more than 3 months is called chronic pain. Managing chronic pain usually requires more than one approach and is often provided by a team of health care providers working together (multidisciplinary approach). Pain management may be done at a pain management center or pain clinic. How to manage pain without the use of opioids Use non-opioid medicines Non-opioid medicines for pain may include: Over-the-counter or prescription non-steroidal anti-inflammatory drugs (NSAIDs). These may be the first medicines used for pain. They work well  for muscle and bone pain, and they reduce swelling. Acetaminophen . This over-the-counter medicine may work well for milder pain but not swelling. Antidepressants. These may be used to treat chronic pain. A certain type of antidepressant (tricyclics) is often used. These medicines are given in lower doses for pain than when used for depression. Anticonvulsants. These are usually used to treat seizures but may also reduce nerve (neuropathic) pain. Muscle relaxants. These  relieve pain caused by sudden muscle tightening (spasms). You may also use a pain medicine that is applied to the skin as a patch, cream, or gel (topical analgesic), such as a numbing medicine. These may cause fewer side effects than medicines taken by mouth. Do certain therapies as directed Some therapies can help with pain management. They include: Physical therapy. You will do exercises to gain strength and flexibility. A physical therapist may teach you exercises to move and stretch parts of your body that are weak, stiff, or painful. You can learn these exercises at physical therapy visits and practice them at home. Physical therapy may also involve: Massage. Heat wraps or applying heat or cold to affected areas. Electrical signals that interrupt pain signals (transcutaneous electrical nerve stimulation, TENS). Weak lasers that reduce pain and swelling (low-level laser therapy). Signals from your body that help you learn to regulate pain (biofeedback). Occupational therapy. This helps you to learn ways to function at home and work with less pain. Recreational therapy. This involves trying new activities or hobbies, such as a physical activity or drawing. Mental health therapy, including: Cognitive behavioral therapy (CBT). This helps you learn coping skills for dealing with pain. Acceptance and commitment therapy (ACT) to change the way you think and react to pain. Relaxation therapies, including muscle relaxation exercises and mindfulness-based stress reduction. Pain management counseling. This may be individual, family, or group counseling.  Receive medical treatments Medical treatments for pain management include: Nerve block injections. These may include a pain blocker and anti-inflammatory medicines. You may have injections: Near the spine to relieve chronic back or neck pain. Into joints to relieve back or joint pain. Into nerve areas that supply a painful area to relieve body  pain. Into muscles (trigger point injections) to relieve some painful muscle conditions. A medical device placed near your spine to help block pain signals and relieve nerve pain or chronic back pain (spinal cord stimulation device). Acupuncture. Follow these instructions at home Medicines Take over-the-counter and prescription medicines only as told by your health care provider. If you are taking pain medicine, ask your health care providers about possible side effects to watch out for. Do not drive or use heavy machinery while taking prescription opioid pain medicine. Lifestyle  Do not use drugs or alcohol to reduce pain. If you drink alcohol, limit how much you have to: 0-1 drink a day for women who are not pregnant. 0-2 drinks a day for men. Know how much alcohol is in a drink. In the [place] one drink equals one 12 oz bottle of beer (355 mL), one 5 oz glass of wine (148 mL), or one 1 oz glass of hard liquor (44 mL). Do not use any products that contain nicotine or tobacco. These products include cigarettes, chewing tobacco, and vaping devices, such as e-cigarettes. If you need help quitting, ask your health care provider. Eat a healthy diet and maintain a healthy weight. Poor diet and excess weight may make pain worse. Eat foods that are high in fiber. These include fresh fruits and vegetables, whole grains,  and beans. Limit foods that are high in fat and processed sugars, such as fried and sweet foods. Exercise regularly. Exercise lowers stress and may help relieve pain. Ask your health care provider what activities and exercises are safe for you. If your health care provider approves, join an exercise class that combines movement and stress reduction. Examples include yoga and tai chi. Get enough sleep. Lack of sleep may make pain worse. Lower stress as much as possible. Practice stress reduction techniques as told by your therapist. General instructions Work with all your pain  management providers to find the treatments that work best for you. You are an important member of your pain management team. There are many things you can do to reduce pain on your own. Consider joining an online or in-person support group for people who have chronic pain. Keep all follow-up visits. This is important. Where to find more information You can find more information about managing pain without opioids from: American Academy of Pain Medicine: painmed.org Institute for Chronic Pain: instituteforchronicpain.org American Chronic Pain Association: theacpa.org Contact a health care provider if: You have side effects from pain medicine. Your pain gets worse or does not get better with treatments or home therapy. You are struggling with anxiety or depression. Summary Many types of pain can be managed without opioids. Chronic pain may respond better to pain management without opioids. Pain is best managed when you and a team of health care providers work together. Pain management without opioids may include non-opioid medicines, medical treatments, physical therapy, mental health therapy, and lifestyle changes. Tell your health care providers if your pain gets worse or is not being managed well enough. This information is not intended to replace advice given to you by your health care provider. Make sure you discuss any questions you have with your health care provider. Document Revised: 06/26/2020 Document Reviewed: 06/26/2020 Elsevier Patient Education  2024 [employer].

## 2023-09-24 NOTE — Progress Notes (Signed)
 Subjective:   Vernon Bishop is a 83 y.o. who presents for a Medicare Wellness preventive visit.  As a reminder, Annual Wellness Visits don't include a physical exam, and some assessments may be limited, especially if this visit is performed virtually. We may recommend an in-person follow-up visit with your provider if needed.  Visit Complete: Virtual I connected with  Vernon Bishop on 09/24/23 by a audio enabled telemedicine application and verified that I am speaking with the correct person using two identifiers.  Patient Location: Home  Provider Location: Office/Clinic  I discussed the limitations of evaluation and management by telemedicine. The patient expressed understanding and agreed to proceed.  Vital Signs: Because this visit was a virtual/telehealth visit, some criteria may be missing or patient reported. Any vitals not documented were not able to be obtained and vitals that have been documented are patient reported.  VideoDeclined- This patient declined Librarian, academic. Therefore the visit was completed with audio only.  Persons Participating in Visit: Patient.  AWV Questionnaire: No: Patient Medicare AWV questionnaire was not completed prior to this visit.  Cardiac Risk Factors include: advanced age (>82men, >50 women);diabetes mellitus;dyslipidemia;hypertension;male gender     Objective:    Today's Vitals   09/24/23 1035  Weight: 183 lb (83 kg)  Height: 6' (1.829 m)   Body mass index is 24.82 kg/m.     09/24/2023   10:34 AM 02/08/2023   10:22 AM 11/19/2022    9:20 AM 09/08/2022    2:12 PM 04/22/2022    2:34 PM 02/02/2022    2:42 PM 11/24/2021    9:17 AM  Advanced Directives  Does Patient Have a Medical Advance Directive? Yes Yes Yes Yes No Yes Yes  Type of Estate agent of [place];Living will Healthcare Power of [place];Living will;Out of facility DNR (pink MOST or yellow form) Healthcare Power of  [place];Living will Healthcare Power of [place];Living will  Healthcare Power of [place];Living will;Out of facility DNR (pink MOST or yellow form) Healthcare Power of [place];Living will  Copy of Healthcare Power of Attorney in Chart? No - copy requested  No - copy requested No - copy requested   No - copy requested  Would patient like information on creating a medical advance directive?     No - Patient declined      Current Medications (verified) Outpatient Encounter Medications as of 09/24/2023  Medication Sig   acetaminophen  (TYLENOL ) 325 MG tablet Take 325-650 mg by mouth every 6 (six) hours as needed for mild pain or headache.   amLODipine  (NORVASC ) 5 MG tablet TAKE 1 TABLET (5 MG TOTAL) BY MOUTH DAILY.   apixaban  (ELIQUIS ) 5 MG TABS tablet Take 1 tablet (5 mg total) by mouth 2 (two) times daily.   Ascorbic Acid (C-500/ROSE HIPS PO) Take by mouth.   chlorhexidine  (HIBICLENS ) 4 % external liquid Apply topically daily as needed. Use 1-2 times in the shower   Cholecalciferol  (VITAMIN D3 PO) Take 1 tablet by mouth daily.   clotrimazole -betamethasone  (LOTRISONE ) cream APPLY TO AFFECTED AREA ON THE SKIN 2 TIMES A DAY   Cyanocobalamin  (VITAMIN B-12 CR) 1000 MCG TBCR Take 1,000 mcg by mouth daily.     ezetimibe  (ZETIA ) 10 MG tablet Take 1 tablet (10 mg total) by mouth daily.   FLUoxetine  (PROZAC ) 40 MG capsule Take 1 capsule (40 mg total) by mouth daily.   furosemide  (LASIX ) 20 MG tablet TAKE 1 TABLET (20 MG TOTAL) BY MOUTH DAILY.   gabapentin  (NEURONTIN )  300 MG capsule TAKE 1 CAPSULE (300 MG TOTAL) BY MOUTH 3 (THREE) TIMES DAILY.   ketoconazole  (NIZORAL ) 2 % cream Apply 1 Application topically daily.   Multiple Vitamin (MULTIVITAMIN WITH MINERALS) TABS tablet Take 1 tablet by mouth daily.   mupirocin  ointment (BACTROBAN ) 2 % On leg wound w/dressing change qd or bid   olmesartan -hydrochlorothiazide  (BENICAR  HCT) 20-12.5 MG tablet TAKE 1 TABLET BY MOUTH DAILY.   Oxycodone  HCl 10 MG TABS Take  1 tablet (10 mg total) by mouth every 6 (six) hours as needed. Per Dr Bonner   pantoprazole  (PROTONIX ) 40 MG tablet TAKE 1 TABLET (40 MG TOTAL) BY MOUTH DAILY.   PRESCRIPTION MEDICATION CPAP- At bedtime   propranolol  ER (INDERAL  LA) 80 MG 24 hr capsule TAKE 1 CAPSULE (80 MG TOTAL) BY MOUTH DAILY.   Saw Palmetto, Serenoa repens, (SAW PALMETTO PO) Take 1 capsule by mouth daily.   sodium chloride  (OCEAN) 0.65 % SOLN nasal spray Place 1 spray into both nostrils as needed for congestion.   tetrahydrozoline 0.05 % ophthalmic solution Place 1 drop into both eyes 2 (two) times daily as needed (for redness).   traZODone  (DESYREL ) 50 MG tablet TAKE 1 TO 2 TABLETS BY MOUTH AT BEDTIME.   triamcinolone  ointment (KENALOG ) 0.1 % Apply 1 Application topically 4 (four) times daily.   zinc  gluconate 50 MG tablet Take 50 mg by mouth daily.   No facility-administered encounter medications on file as of 09/24/2023.    Allergies (verified) Abilify  [aripiprazole ], Cymbalta [duloxetine hcl], Digoxin  and related, Prednisolone , Temazepam, Zanaflex  [tizanidine ], and Other   History: Past Medical History:  Diagnosis Date   Arthralgia    Atrial fibrillation (HCC)    1992   Benign prostatic hyperplasia    Bradycardia    my doctor says I have bradycardia, my heart rate is always in the 40s   DDD (degenerative disc disease)    Depression    Dysrhythmia    aFIB   Dyssomnia    ED (erectile dysfunction)    Hyperlipidemia    Hypertension    Hypogonadism male    Joint pain    LBP (low back pain)    Left pontine stroke (HCC)    numbness on tongue and occasional slurred speech   Multiple actinic keratoses    Muscle weakness    Upper limb   MVP (mitral valve prolapse)    Neck pain    OSA on CPAP    cpap   Osteoarthritis of hip    Osteoarthritis, hand    SEVERE LOWER BACK PAIN, AND RESTLESS LEG SYNDROME   Otalgia of right ear    Pain in left knee    Pain in thoracic spine    Palpitations    PTSD  (post-traumatic stress disorder)    Shoulder pain    Subjective visual disturbance    Swallowing problem    HX OF PILLS GETTING STUCK IN THROAT AT TIMES   Swelling    Vitamin D  deficiency    Past Surgical History:  Procedure Laterality Date   ABDOMINAL EXPOSURE N/A 03/12/2021   Procedure: ABDOMINAL EXPOSURE;  Surgeon: Gretta Lonni PARAS, MD;  Location: [facility] OR;  Service: Vascular;  Laterality: N/A;   ANTERIOR LAT LUMBAR FUSION N/A 03/12/2021   Procedure: ANTERIOR LATERAL LUMBAR FUSION 2 LEVELS;  Surgeon: Burnetta Aures, MD;  Location: [facility] OR;  Service: Orthopedics;  Laterality: N/A;   APPENDECTOMY     BACK SURGERY  1987   CARDIAC CATHETERIZATION  Many Years Ago. Lived in [place]   CATARACT EXTRACTION     CERVICAL LAMINECTOMY  2004   [name], rod was placed- HAS ROM LIMITATIONS   CHOLECYSTECTOMY     COLONOSCOPY     KNEE SURGERY     left knee/arthroscopic   LUMBAR LAMINECTOMY/DECOMPRESSION MICRODISCECTOMY N/A 09/11/2022   Procedure: Laminectomy and Foraminotomy - Thoracic eleven -Twelve- Lumbar one-Lumbar two;  Surgeon: Joshua Alm Hamilton, MD;  Location: [facility] OR;  Service: Neurosurgery;  Laterality: N/A;   TOTAL HIP ARTHROPLASTY Right 03/14/2013   Procedure: RIGHT TOTAL HIP ARTHROPLASTY ANTERIOR APPROACH;  Surgeon: Donnice JONETTA Car, MD;  Location: [facility] ORS;  Service: Orthopedics;  Laterality: Right;   TOTAL HIP ARTHROPLASTY Left 05/23/2019   Procedure: TOTAL HIP ARTHROPLASTY ANTERIOR APPROACH;  Surgeon: Car Donnice, MD;  Location: [facility] ORS;  Service: Orthopedics;  Laterality: Left;  70 mins   Family History  Problem Relation Age of Onset   Allergies Mother    Stroke Mother        Clotting disorders   Heart disease Mother    Heart disease Father    Heart attack Father 52   Heart disease Brother    Rheum arthritis Paternal Grandmother    Other Paternal Grandmother        Rheumatism   Coronary artery disease Other    Diabetes Other    Hypertension Other    Esophageal cancer Maternal  Aunt    Colon cancer Neg Hx    Rectal cancer Neg Hx    Stomach cancer Neg Hx    Social History   Socioeconomic History   Marital status: Married    Spouse name: Not on file   Number of children: 4   Years of education: Not on file   Highest education level: Not on file  Occupational History   Occupation: Retired-    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  Tobacco Use   Smoking status: Former    Current packs/day: 0.00    Average packs/day: 0.5 packs/day for 12.0 years (6.0 ttl pk-yrs)    Types: Cigarettes    Start date: 03/31/1959    Quit date: 03/31/1971    Years since quitting: 52.5   Smokeless tobacco: Never   Tobacco comments:    Started at age 22  Vaping Use   Vaping status: Never Used  Substance and Sexual Activity   Alcohol use: Not Currently    Alcohol/week: 2.0 standard drinks of alcohol    Types: 2 Cans of beer per week    Comment: occasionally   Drug use: No   Sexual activity: Yes  Other Topics Concern   Not on file  Social History Narrative   FAMILY HISTORYHistory of CAD Male 1st degree relative <50History Diabetes 1st degree relativeHistory hypertension      Right Handed    Lives in a one story home    Social Drivers of Health   Financial Resource Strain: Low Risk  (09/24/2023)   Overall Financial Resource Strain (CARDIA)    Difficulty of Paying Living Expenses: Not hard at all  Food Insecurity: No Food Insecurity (09/24/2023)   Hunger Vital Sign    Worried About Running Out of Food in the Last Year: Never true    Ran Out of Food in the Last Year: Never true  Transportation Needs: No Transportation Needs (09/24/2023)   PRAPARE - Administrator, Civil Service (Medical): No    Lack of Transportation (Non-Medical): No  Physical Activity: Insufficiently  Active (09/24/2023)   Exercise Vital Sign    Days of Exercise per Week: 5 days    Minutes of Exercise per Session: 10 min  Stress: No Stress Concern Present (09/24/2023)   [employer]  of Occupational Health - Occupational Stress Questionnaire    Feeling of Stress: Not at all  Social Connections: Moderately Isolated (09/24/2023)   Social Connection and Isolation Panel    Frequency of Communication with Friends and Family: More than three times a week    Frequency of Social Gatherings with Friends and Family: More than three times a week    Attends Religious Services: Never    Database administrator or Organizations: No    Attends Engineer, structural: Never    Marital Status: Married    Tobacco Counseling Counseling given: No Tobacco comments: Started at age 87    Clinical Intake:  Pre-visit preparation completed: Yes  Pain : No/denies pain     BMI - recorded: 24.82 Nutritional Status: BMI of 19-24  Normal Nutritional Risks: None Diabetes: Yes CBG done?: No Did pt. bring in CBG monitor from home?: No  Lab Results  Component Value Date   HGBA1C 4.9 07/27/2023   HGBA1C 5.0 06/09/2021   HGBA1C 5.0 06/03/2021     How often do you need to have someone help you when you read instructions, pamphlets, or other written materials from your doctor or pharmacy?: 1 - Never  Interpreter Needed?: No  Information entered by :: Verdie Saba, CMA   Activities of Daily Living     09/24/2023   10:40 AM 11/19/2022    9:23 AM  In your present state of health, do you have any difficulty performing the following activities:  Hearing? 0 0  Vision? 0 0  Difficulty concentrating or making decisions? 0 0  Walking or climbing stairs? 1 0  Comment uses a cane   Dressing or bathing? 0 0  Doing errands, shopping? 0 0  Preparing Food and eating ? N N  Using the Toilet? N N  In the past six months, have you accidently leaked urine? Y N  Comment wears depends sometimes   Do you have problems with loss of bowel control? N N  Managing your Medications? N N  Managing your Finances? N N  Housekeeping or managing your Housekeeping? N N    Patient Care  Team: [name], Karlynn GAILS, MD as PCP - General (Internal Medicine) Ernie Cough, MD as Consulting Physician (Orthopedic Surgery) Tasia Lung, MD as Consulting Physician (Psychiatry) Burnetta Aures, MD as Consulting Physician (Orthopedic Surgery) [facility] as Consulting Physician (Ophthalmology) Elmira Newman PARAS, MD as Consulting Physician (Cardiology) Patel, Donika K, DO as Consulting Physician (Neurology)  I have updated your Care Teams any recent Medical Services you may have received from other providers in the past year.     Assessment:   This is a routine wellness examination for [facility].  Hearing/Vision screen Hearing Screening - Comments:: Denies hearing difficulties   Vision Screening - Comments:: Wears rx glasses - up to date with routine eye exams an Ophthalmologist    Goals Addressed               This Visit's Progress     Patient Stated (pt-stated)        Patient stated he plans to continue exercising and want to walk better       Depression Screen     09/24/2023   10:42 AM 04/07/2023  3:48 PM 12/16/2022    9:16 AM 11/19/2022    9:22 AM 09/10/2022    9:30 AM 06/09/2022    3:23 PM 03/31/2022   10:54 AM  PHQ 2/9 Scores  PHQ - 2 Score 0 0 0 0 0 0 0  PHQ- 9 Score 3 0 0 0   2    Fall Risk     09/24/2023   10:40 AM 04/07/2023    3:48 PM 02/08/2023   10:22 AM 12/16/2022    9:15 AM 11/19/2022    9:22 AM  Fall Risk   Falls in the past year? 0 0 0 0 0  Number falls in past yr: 0 0 0 0 0  Injury with Fall? 0 0 0 0 0  Risk for fall due to : No Fall Risks   No Fall Risks No Fall Risks  Follow up Falls evaluation completed;Falls prevention discussed Falls evaluation completed Falls evaluation completed Falls evaluation completed Falls prevention discussed    MEDICARE RISK AT HOME:  Medicare Risk at Home Any stairs in or around the home?: Yes If so, are there any without handrails?: No Home free of loose throw rugs in  walkways, pet beds, electrical cords, etc?: Yes Adequate lighting in your home to reduce risk of falls?: Yes Life alert?: No Use of a cane, walker or w/c?: Yes (cane/walker) Grab bars in the bathroom?: Yes Shower chair or bench in shower?: Yes Elevated toilet seat or a handicapped toilet?: Yes  TIMED UP AND GO:  Was the test performed?  No  Cognitive Function: 6CIT completed    04/02/2017    4:01 PM  MMSE - Mini Mental State Exam  Not completed: Unable to complete  Orientation to time 5   Orientation to Place 5      Data saved with a previous flowsheet row definition        09/24/2023   10:45 AM 11/19/2022    9:29 AM 11/24/2021    9:14 AM  6CIT Screen  What Year? 0 points 0 points 0 points  What month? 0 points 0 points 0 points  What time? 0 points 0 points 0 points  Count back from 20 0 points 0 points 0 points  Months in reverse 0 points 0 points 0 points  Repeat phrase 0 points 0 points 0 points  Total Score 0 points 0 points 0 points    Immunizations Immunization History  Administered Date(s) Administered   Fluad Quad(high Dose 65+) 11/22/2018, 12/20/2019   Influenza Split 01/28/2011, 12/18/2011   Influenza Whole 02/14/2007, 12/11/2009   Influenza, High Dose Seasonal PF 02/03/2016, 12/30/2016, 01/04/2018, 12/09/2020   Influenza,inj,Quad PF,6+ Mos 01/03/2013, 01/10/2014, 12/05/2014   PFIZER(Purple Top)SARS-COV-2 Vaccination 04/19/2019, 05/10/2019, 12/24/2019, 07/05/2020   PNEUMOCOCCAL CONJUGATE-20 08/30/2020   Pfizer Covid-19 Vaccine Bivalent Booster 23yrs & up 12/09/2020   Pneumococcal Conjugate-13 05/08/2013   Pneumococcal Polysaccharide-23 08/27/2009   Td 03/30/2008   Td (Adult), 2 Lf Tetanus Toxid, Preservative Free 03/30/2008   Zoster, Live 04/13/2014    Screening Tests Health Maintenance  Topic Date Due   OPHTHALMOLOGY EXAM  12/28/2017   DTaP/Tdap/Td (3 - Tdap) 03/30/2018   FOOT EXAM  09/20/2019   Diabetic kidney evaluation - Urine ACR  12/19/2020    COVID-19 Vaccine (6 - 2024-25 season) 11/29/2022   INFLUENZA VACCINE  10/29/2023   HEMOGLOBIN A1C  01/26/2024   Diabetic kidney evaluation - eGFR measurement  07/26/2024   Medicare Annual Wellness (AWV)  09/23/2024   Pneumococcal Vaccine: 50+  Years  Completed   Hepatitis B Vaccines  Aged Out   HPV VACCINES  Aged Out   Meningococcal B Vaccine  Aged Out   Zoster Vaccines- Shingrix  Discontinued    Health Maintenance  Health Maintenance Due  Topic Date Due   OPHTHALMOLOGY EXAM  12/28/2017   DTaP/Tdap/Td (3 - Tdap) 03/30/2018   FOOT EXAM  09/20/2019   Diabetic kidney evaluation - Urine ACR  12/19/2020   COVID-19 Vaccine (6 - 2024-25 season) 11/29/2022   Health Maintenance Items Addressed:09/24/2023   Additional Screening:  Vision Screening: Recommended annual ophthalmology exams for early detection of glaucoma and other disorders of the eye. Would you like a referral to an eye doctor? No    Dental Screening: Recommended annual dental exams for proper oral hygiene  Community Resource Referral / Chronic Care Management: CRR required this visit?  No   CCM required this visit?  No   Plan:    I have personally reviewed and noted the following in the patient's chart:   Medical and social history Use of alcohol, tobacco or illicit drugs  Current medications and supplements including opioid prescriptions. Patient is currently taking opioid prescriptions. Information provided to patient regarding non-opioid alternatives. Patient advised to discuss non-opioid treatment plan with their provider. Functional ability and status Nutritional status Physical activity Advanced directives List of other physicians Hospitalizations, surgeries, and ER visits in previous 12 months Vitals Screenings to include cognitive, depression, and falls Referrals and appointments  In addition, I have reviewed and discussed with patient certain preventive protocols, quality metrics, and best  practice recommendations. A written personalized care plan for preventive services as well as general preventive health recommendations were provided to patient.   Verdie CHRISTELLA Saba, CMA   09/24/2023   After Visit Summary: (MyChart) Due to this being a telephonic visit, the after visit summary with patients personalized plan was offered to patient via MyChart   Notes: Nothing significant to report at this time.

## 2023-10-07 DIAGNOSIS — M5416 Radiculopathy, lumbar region: Secondary | ICD-10-CM | POA: Diagnosis not present

## 2023-10-07 DIAGNOSIS — M5136 Other intervertebral disc degeneration, lumbar region with discogenic back pain only: Secondary | ICD-10-CM | POA: Diagnosis not present

## 2023-10-27 ENCOUNTER — Ambulatory Visit (INDEPENDENT_AMBULATORY_CARE_PROVIDER_SITE_OTHER): Admitting: Internal Medicine

## 2023-10-27 ENCOUNTER — Encounter: Payer: Self-pay | Admitting: Internal Medicine

## 2023-10-27 VITALS — BP 120/58 | HR 43 | Temp 98.4°F | Ht 72.0 in | Wt 192.0 lb

## 2023-10-27 DIAGNOSIS — E119 Type 2 diabetes mellitus without complications: Secondary | ICD-10-CM

## 2023-10-27 DIAGNOSIS — M5440 Lumbago with sciatica, unspecified side: Secondary | ICD-10-CM

## 2023-10-27 DIAGNOSIS — I10 Essential (primary) hypertension: Secondary | ICD-10-CM | POA: Diagnosis not present

## 2023-10-27 DIAGNOSIS — I480 Paroxysmal atrial fibrillation: Secondary | ICD-10-CM | POA: Diagnosis not present

## 2023-10-27 DIAGNOSIS — R29898 Other symptoms and signs involving the musculoskeletal system: Secondary | ICD-10-CM

## 2023-10-27 DIAGNOSIS — E538 Deficiency of other specified B group vitamins: Secondary | ICD-10-CM | POA: Diagnosis not present

## 2023-10-27 DIAGNOSIS — R399 Unspecified symptoms and signs involving the genitourinary system: Secondary | ICD-10-CM | POA: Insufficient documentation

## 2023-10-27 DIAGNOSIS — N138 Other obstructive and reflux uropathy: Secondary | ICD-10-CM

## 2023-10-27 DIAGNOSIS — N401 Enlarged prostate with lower urinary tract symptoms: Secondary | ICD-10-CM

## 2023-10-27 LAB — HEMOGLOBIN A1C: Hgb A1c MFr Bld: 5 % (ref 4.6–6.5)

## 2023-10-27 LAB — URINALYSIS
Bilirubin Urine: NEGATIVE
Hgb urine dipstick: NEGATIVE
Leukocytes,Ua: NEGATIVE
Nitrite: NEGATIVE
Specific Gravity, Urine: 1.015 (ref 1.000–1.030)
Urine Glucose: NEGATIVE
Urobilinogen, UA: 0.2 (ref 0.0–1.0)
pH: 6.5 (ref 5.0–8.0)

## 2023-10-27 LAB — COMPREHENSIVE METABOLIC PANEL WITH GFR
ALT: 10 U/L (ref 0–53)
AST: 13 U/L (ref 0–37)
Albumin: 4.2 g/dL (ref 3.5–5.2)
Alkaline Phosphatase: 83 U/L (ref 39–117)
BUN: 16 mg/dL (ref 6–23)
CO2: 27 meq/L (ref 19–32)
Calcium: 9.5 mg/dL (ref 8.4–10.5)
Chloride: 100 meq/L (ref 96–112)
Creatinine, Ser: 0.87 mg/dL (ref 0.40–1.50)
GFR: 80.13 mL/min (ref >60.00)
Glucose, Bld: 80 mg/dL (ref 70–99)
Potassium: 4.3 meq/L (ref 3.5–5.1)
Sodium: 134 meq/L — ABNORMAL LOW (ref 135–145)
Total Bilirubin: 0.5 mg/dL (ref 0.2–1.2)
Total Protein: 6.3 g/dL (ref 6.0–8.3)

## 2023-10-27 LAB — MICROALBUMIN / CREATININE URINE RATIO
Creatinine,U: 187.2 mg/dL
Microalb Creat Ratio: 33.9 mg/g — ABNORMAL HIGH (ref 0.0–30.0)
Microalb, Ur: 6.4 mg/dL — ABNORMAL HIGH (ref 0.0–1.9)

## 2023-10-27 LAB — PSA: PSA: 0.63 ng/mL (ref 0.10–4.00)

## 2023-10-27 NOTE — Assessment & Plan Note (Signed)
 On Eliquis

## 2023-10-27 NOTE — Assessment & Plan Note (Signed)
Cont w/Lotensin HCT, Amlodipine, Inderal

## 2023-10-27 NOTE — Assessment & Plan Note (Signed)
 On Oxy 10 mg qid per Dr Bonner  S/p 4 surgeries Overall doing OK Try to walk more

## 2023-10-27 NOTE — Assessment & Plan Note (Signed)
 Remote Check UA, PSA

## 2023-10-27 NOTE — Progress Notes (Signed)
 Subjective:  Patient ID: Vernon Bishop, male    DOB: April 22, 1940  Age: 83 y.o. MRN: [id number]  CC: Medical Management of Chronic Issues (Pt here for 3 month f/u Diabetes, HTN) and Urinary issues (Pt c/o urinary incontinence at night x 3 episodes.)   HPI Vernon Bishop presents for 3 month f/u Diabetes, HTN. C/o Urinary issues (Pt c/o urinary incontinence at night x 3 episodes in spring - all resolved.  Outpatient Medications Prior to Visit  Medication Sig Dispense Refill   acetaminophen  (TYLENOL ) 325 MG tablet Take 325-650 mg by mouth every 6 (six) hours as needed for mild pain or headache.     amLODipine  (NORVASC ) 5 MG tablet TAKE 1 TABLET (5 MG TOTAL) BY MOUTH DAILY. 90 tablet 3   apixaban  (ELIQUIS ) 5 MG TABS tablet Take 1 tablet (5 mg total) by mouth 2 (two) times daily. 60 tablet 5   Ascorbic Acid (C-500/ROSE HIPS PO) Take by mouth.     chlorhexidine  (HIBICLENS ) 4 % external liquid Apply topically daily as needed. Use 1-2 times in the shower 500 mL 0   Cholecalciferol  (VITAMIN D3 PO) Take 1 tablet by mouth daily.     clotrimazole -betamethasone  (LOTRISONE ) cream APPLY TO AFFECTED AREA ON THE SKIN 2 TIMES A DAY 45 g 2   Cyanocobalamin  (VITAMIN B-12 CR) 1000 MCG TBCR Take 1,000 mcg by mouth daily.       ezetimibe  (ZETIA ) 10 MG tablet Take 1 tablet (10 mg total) by mouth daily. 30 tablet 3   FLUoxetine  (PROZAC ) 40 MG capsule Take 1 capsule (40 mg total) by mouth daily. 90 capsule 2   furosemide  (LASIX ) 20 MG tablet TAKE 1 TABLET (20 MG TOTAL) BY MOUTH DAILY. 30 tablet 3   gabapentin  (NEURONTIN ) 300 MG capsule TAKE 1 CAPSULE (300 MG TOTAL) BY MOUTH 3 (THREE) TIMES DAILY. 270 capsule 3   ketoconazole  (NIZORAL ) 2 % cream Apply 1 Application topically daily. 45 g 1   Multiple Vitamin (MULTIVITAMIN WITH MINERALS) TABS tablet Take 1 tablet by mouth daily.     mupirocin  ointment (BACTROBAN ) 2 % On leg wound w/dressing change qd or bid 30 g 0   olmesartan -hydrochlorothiazide  (BENICAR  HCT)  20-12.5 MG tablet TAKE 1 TABLET BY MOUTH DAILY. 90 tablet 3   Oxycodone  HCl 10 MG TABS Take 1 tablet (10 mg total) by mouth every 6 (six) hours as needed. Per Dr Bonner     pantoprazole  (PROTONIX ) 40 MG tablet TAKE 1 TABLET (40 MG TOTAL) BY MOUTH DAILY. 30 tablet 3   propranolol  ER (INDERAL  LA) 80 MG 24 hr capsule TAKE 1 CAPSULE (80 MG TOTAL) BY MOUTH DAILY. 90 capsule 4   Saw Palmetto, Serenoa repens, (SAW PALMETTO PO) Take 1 capsule by mouth daily.     sodium chloride  (OCEAN) 0.65 % SOLN nasal spray Place 1 spray into both nostrils as needed for congestion.     tetrahydrozoline 0.05 % ophthalmic solution Place 1 drop into both eyes 2 (two) times daily as needed (for redness).     traZODone  (DESYREL ) 50 MG tablet TAKE 1 TO 2 TABLETS BY MOUTH AT BEDTIME. 60 tablet 5   triamcinolone  ointment (KENALOG ) 0.1 % Apply 1 Application topically 4 (four) times daily. 160 g 2   zinc  gluconate 50 MG tablet Take 50 mg by mouth daily.     PRESCRIPTION MEDICATION CPAP- At bedtime (Patient not taking: Reported on 10/27/2023)     No facility-administered medications prior to visit.    ROS: Review of Systems  Constitutional:  Positive for fatigue. Negative for appetite change and unexpected weight change.  HENT:  Negative for congestion, nosebleeds, sneezing, sore throat and trouble swallowing.   Eyes:  Negative for itching and visual disturbance.  Respiratory:  Negative for cough.   Cardiovascular:  Negative for chest pain, palpitations and leg swelling.  Gastrointestinal:  Negative for abdominal distention, blood in stool, diarrhea and nausea.  Genitourinary:  Negative for frequency and hematuria.  Musculoskeletal:  Positive for arthralgias, back pain and gait problem. Negative for joint swelling and neck pain.  Skin:  Negative for rash.  Neurological:  Negative for dizziness, tremors, speech difficulty and weakness.  Psychiatric/Behavioral:  Positive for dysphoric mood. Negative for agitation, sleep  disturbance and suicidal ideas. The patient is not nervous/anxious.     Objective:  BP (!) 120/58 (BP Location: Left Arm, Patient Position: Sitting, Cuff Size: Large)   Pulse (!) 43   Temp 98.4 F (36.9 C) (Oral)   Ht 6' (1.829 m)   Wt 192 lb (87.1 kg)   SpO2 98%   BMI 26.04 kg/m   BP Readings from Last 3 Encounters:  10/27/23 (!) 120/58  07/27/23 126/68  05/31/23 122/60    Wt Readings from Last 3 Encounters:  10/27/23 192 lb (87.1 kg)  09/24/23 183 lb (83 kg)  07/27/23 191 lb 3.2 oz (86.7 kg)    Physical Exam Constitutional:      General: He is not in acute distress.    Appearance: He is well-developed. He is obese.     Comments: NAD  Eyes:     Conjunctiva/sclera: Conjunctivae normal.     Pupils: Pupils are equal, round, and reactive to light.  Neck:     Thyroid : No thyromegaly.     Vascular: No JVD.  Cardiovascular:     Rate and Rhythm: Normal rate and regular rhythm.     Heart sounds: Normal heart sounds. No murmur heard.    No friction rub. No gallop.  Pulmonary:     Effort: Pulmonary effort is normal. No respiratory distress.     Breath sounds: Normal breath sounds. No wheezing or rales.  Chest:     Chest wall: No tenderness.  Abdominal:     General: Bowel sounds are normal. There is no distension.     Palpations: Abdomen is soft. There is no mass.     Tenderness: There is no abdominal tenderness. There is no guarding or rebound.  Musculoskeletal:        General: Tenderness present. Normal range of motion.     Cervical back: Normal range of motion.  Lymphadenopathy:     Cervical: No cervical adenopathy.  Skin:    General: Skin is warm and dry.     Findings: No rash.  Neurological:     Mental Status: He is alert and oriented to person, place, and time. Mental status is at baseline.     Cranial Nerves: No cranial nerve deficit.     Motor: No abnormal muscle tone.     Coordination: Coordination abnormal.     Gait: Gait abnormal.     Deep Tendon  Reflexes: Reflexes are normal and symmetric.  Psychiatric:        Behavior: Behavior normal.        Thought Content: Thought content normal.        Judgment: Judgment normal.   Using a walker LS spine w/pain  Lab Results  Component Value Date   WBC 8.2 04/07/2023   HGB 13.2 04/07/2023  HCT 39.2 04/07/2023   PLT 225.0 04/07/2023   GLUCOSE 84 07/27/2023   CHOL 119 05/20/2022   TRIG 102 05/20/2022   HDL 35 (L) 05/20/2022   LDLCALC 65 05/20/2022   ALT 13 07/27/2023   AST 19 07/27/2023   NA 136 07/27/2023   K 4.3 07/27/2023   CL 100 07/27/2023   CREATININE 0.81 07/27/2023   BUN 15 07/27/2023   CO2 32 07/27/2023   TSH 3.48 07/27/2023   PSA 0.73 12/20/2019   INR 1.2 04/22/2022   HGBA1C 4.9 07/27/2023   MICROALBUR 3.9 12/20/2019    US  Abdomen Complete Result Date: 04/09/2023 CLINICAL DATA:  Nausea, elevated lipase, post cholecystectomy EXAM: ABDOMEN ULTRASOUND COMPLETE COMPARISON:  None Available. FINDINGS: Gallbladder: Surgically absent Common bile duct: Diameter: 8.6 mm. No intrahepatic biliary ductal dilatation. Liver: No focal lesion identified. Within normal limits in parenchymal echogenicity. Portal vein is patent on color Doppler imaging with normal direction of blood flow towards the liver. IVC: No abnormality visualized. Pancreas: Visualized segments unremarkable, portions obscured by overlying bowel gas. Spleen: Normal size.  Punctate  echogenic foci possibly granulomas. Right Kidney: Length: 12.1 cm. Echogenicity within normal limits. No mass or hydronephrosis visualized. Left Kidney: Length: 12.8 cm. Echogenicity within normal limits. No mass or hydronephrosis visualized. Abdominal aorta: No aneurysm visualized. Other findings: No ascites. IMPRESSION: 1. No acute findings. 2. Mildly dilated common bile duct, likely postcholecystectomy reservoir effect. Electronically Signed   By: JONETTA Faes M.D.   On: 04/09/2023 09:58    Assessment & Plan:   Problem List Items Addressed  This Visit     Arm weakness   Try a car door latch handle for elderly      Atrial fibrillation (HCC)   On Eliquis        Relevant Orders   Comprehensive metabolic panel with GFR   Microalbumin / creatinine urine ratio   Urinalysis   PSA   Hemoglobin A1c   BPH (benign prostatic hyperplasia)   Check UA, PSA      Relevant Orders   Urinalysis   PSA   Diabetes mellitus type 2, diet-controlled (HCC) - Primary    Pt lost wt on diet  before, stable wt now Check A1c      Relevant Orders   Comprehensive metabolic panel with GFR   Microalbumin / creatinine urine ratio   Urinalysis   PSA   Hemoglobin A1c   Essential hypertension   Cont w/Lotensin  HCT, Amlodipine , Inderal       LOW BACK PAIN   On Oxy 10 mg qid per Dr Bonner  S/p 4 surgeries Overall doing OK Try to walk more      Lower urinary tract symptoms   Remote Check UA, PSA      Vitamin B12 deficiency   On Vit B12         No orders of the defined types were placed in this encounter.     Follow-up: Return in about 3 months (around 01/27/2024) for a follow-up visit.  Marolyn Noel, MD

## 2023-10-27 NOTE — Assessment & Plan Note (Signed)
On Vit B12 

## 2023-10-27 NOTE — Assessment & Plan Note (Signed)
 Check UA, PSA

## 2023-10-27 NOTE — Assessment & Plan Note (Signed)
 Try a car door latch handle for elderly

## 2023-10-27 NOTE — Assessment & Plan Note (Signed)
 Pt lost wt on diet  before, stable wt now Check A1c

## 2023-10-28 ENCOUNTER — Ambulatory Visit: Payer: Self-pay | Admitting: Internal Medicine

## 2023-12-09 ENCOUNTER — Other Ambulatory Visit: Payer: Self-pay | Admitting: Internal Medicine

## 2023-12-09 DIAGNOSIS — I10 Essential (primary) hypertension: Secondary | ICD-10-CM

## 2023-12-30 ENCOUNTER — Other Ambulatory Visit: Payer: Self-pay | Admitting: Internal Medicine

## 2024-01-27 ENCOUNTER — Encounter: Payer: Self-pay | Admitting: Internal Medicine

## 2024-01-27 ENCOUNTER — Ambulatory Visit: Admitting: Internal Medicine

## 2024-01-27 VITALS — BP 128/86 | HR 45 | Temp 98.3°F | Ht 72.0 in | Wt 190.2 lb

## 2024-01-27 DIAGNOSIS — R269 Unspecified abnormalities of gait and mobility: Secondary | ICD-10-CM | POA: Diagnosis not present

## 2024-01-27 DIAGNOSIS — Z8673 Personal history of transient ischemic attack (TIA), and cerebral infarction without residual deficits: Secondary | ICD-10-CM

## 2024-01-27 DIAGNOSIS — I480 Paroxysmal atrial fibrillation: Secondary | ICD-10-CM | POA: Diagnosis not present

## 2024-01-27 DIAGNOSIS — E119 Type 2 diabetes mellitus without complications: Secondary | ICD-10-CM | POA: Diagnosis not present

## 2024-01-27 DIAGNOSIS — G8929 Other chronic pain: Secondary | ICD-10-CM

## 2024-01-27 DIAGNOSIS — M545 Low back pain, unspecified: Secondary | ICD-10-CM | POA: Diagnosis not present

## 2024-01-27 LAB — COMPREHENSIVE METABOLIC PANEL WITH GFR
ALT: 12 U/L (ref 0–53)
AST: 16 U/L (ref 0–37)
Albumin: 4.2 g/dL (ref 3.5–5.2)
Alkaline Phosphatase: 88 U/L (ref 39–117)
BUN: 15 mg/dL (ref 6–23)
CO2: 28 meq/L (ref 19–32)
Calcium: 9.5 mg/dL (ref 8.4–10.5)
Chloride: 100 meq/L (ref 96–112)
Creatinine, Ser: 0.87 mg/dL (ref 0.40–1.50)
GFR: 79.99 mL/min (ref >60.00)
Glucose, Bld: 86 mg/dL (ref 70–99)
Potassium: 4.3 meq/L (ref 3.5–5.1)
Sodium: 135 meq/L (ref 135–145)
Total Bilirubin: 0.5 mg/dL (ref 0.2–1.2)
Total Protein: 6.8 g/dL (ref 6.0–8.3)

## 2024-01-27 LAB — HEMOGLOBIN A1C: Hgb A1c MFr Bld: 5 % (ref 4.6–6.5)

## 2024-01-27 MED ORDER — KETOCONAZOLE 2 % EX CREA: 1.0000 | TOPICAL_CREAM | Freq: Every day | CUTANEOUS | 1 refills | Status: AC

## 2024-01-27 NOTE — Assessment & Plan Note (Signed)
 S/p Foraminectomy/laminectomy by Dr Joshua on 09/11/22 (T11-12, T12-L1)  LBP is better On Percoset prn

## 2024-01-27 NOTE — Assessment & Plan Note (Signed)
 Pt lost wt on diet  before, stable wt now Check A1c

## 2024-01-27 NOTE — Assessment & Plan Note (Signed)
 On Eliquis  No falls

## 2024-01-27 NOTE — Assessment & Plan Note (Signed)
On Eliquis MRI - Chronic pontine infarcts.

## 2024-01-27 NOTE — Progress Notes (Signed)
 Subjective:  Patient ID: Vernon Bishop, male    DOB: 06-19-1940  Age: 83 y.o. MRN: [id number]  CC: Medical Management of Chronic Issues (3 Month follow up. Ongoing bilateral lower extremity neuropathy )   HPI Vernon Bishop presents for LBP, rash, depression f/u  Outpatient Medications Prior to Visit  Medication Sig Dispense Refill   acetaminophen  (TYLENOL ) 325 MG tablet Take 325-650 mg by mouth every 6 (six) hours as needed for mild pain or headache.     amLODipine  (NORVASC ) 5 MG tablet TAKE 1 TABLET (5 MG TOTAL) BY MOUTH DAILY. 90 tablet 3   apixaban  (ELIQUIS ) 5 MG TABS tablet Take 1 tablet (5 mg total) by mouth 2 (two) times daily. 60 tablet 5   Ascorbic Acid (C-500/ROSE HIPS PO) Take by mouth.     chlorhexidine  (HIBICLENS ) 4 % external liquid Apply topically daily as needed. Use 1-2 times in the shower 500 mL 0   Cholecalciferol  (VITAMIN D3 PO) Take 1 tablet by mouth daily.     clotrimazole -betamethasone  (LOTRISONE ) cream APPLY TO AFFECTED AREA ON THE SKIN 2 TIMES A DAY 45 g 2   Cyanocobalamin  (VITAMIN B-12 CR) 1000 MCG TBCR Take 1,000 mcg by mouth daily.       ezetimibe  (ZETIA ) 10 MG tablet Take 1 tablet (10 mg total) by mouth daily. 30 tablet 3   FLUoxetine  (PROZAC ) 40 MG capsule Take 1 capsule (40 mg total) by mouth daily. 90 capsule 2   furosemide  (LASIX ) 20 MG tablet TAKE 1 TABLET (20 MG TOTAL) BY MOUTH DAILY. 30 tablet 3   gabapentin  (NEURONTIN ) 300 MG capsule TAKE 1 CAPSULE (300 MG TOTAL) BY MOUTH 3 (THREE) TIMES DAILY. 270 capsule 3   Multiple Vitamin (MULTIVITAMIN WITH MINERALS) TABS tablet Take 1 tablet by mouth daily.     mupirocin  ointment (BACTROBAN ) 2 % On leg wound w/dressing change qd or bid 30 g 0   olmesartan -hydrochlorothiazide  (BENICAR  HCT) 20-12.5 MG tablet TAKE 1 TABLET BY MOUTH DAILY. 90 tablet 3   Oxycodone  HCl 10 MG TABS Take 1 tablet (10 mg total) by mouth every 6 (six) hours as needed. Per Dr Bonner     pantoprazole  (PROTONIX ) 40 MG tablet TAKE 1 TABLET  (40 MG TOTAL) BY MOUTH DAILY. 30 tablet 3   propranolol  ER (INDERAL  LA) 80 MG 24 hr capsule TAKE 1 CAPSULE (80 MG TOTAL) BY MOUTH DAILY. 90 capsule 4   Saw Palmetto, Serenoa repens, (SAW PALMETTO PO) Take 1 capsule by mouth daily.     sodium chloride  (OCEAN) 0.65 % SOLN nasal spray Place 1 spray into both nostrils as needed for congestion.     tetrahydrozoline 0.05 % ophthalmic solution Place 1 drop into both eyes 2 (two) times daily as needed (for redness).     traZODone  (DESYREL ) 50 MG tablet TAKE 1 TO 2 TABLETS BY MOUTH AT BEDTIME. 60 tablet 5   triamcinolone  ointment (KENALOG ) 0.1 % Apply 1 Application topically 4 (four) times daily. 160 g 2   zinc  gluconate 50 MG tablet Take 50 mg by mouth daily.     ketoconazole  (NIZORAL ) 2 % cream Apply 1 Application topically daily. 45 g 1   PRESCRIPTION MEDICATION CPAP- At bedtime (Patient not taking: Reported on 01/27/2024)     No facility-administered medications prior to visit.    ROS: Review of Systems  Constitutional:  Positive for fatigue. Negative for appetite change and unexpected weight change.  HENT:  Negative for congestion, nosebleeds, sneezing, sore throat and trouble swallowing.  Eyes:  Negative for itching and visual disturbance.  Respiratory:  Negative for cough.   Cardiovascular:  Negative for chest pain, palpitations and leg swelling.  Gastrointestinal:  Negative for abdominal distention, blood in stool, diarrhea and nausea.  Genitourinary:  Negative for frequency and hematuria.  Musculoskeletal:  Positive for back pain and gait problem. Negative for joint swelling and neck pain.  Skin:  Positive for rash.  Neurological:  Negative for dizziness, tremors, speech difficulty and weakness.  Psychiatric/Behavioral:  Positive for decreased concentration. Negative for agitation, dysphoric mood, sleep disturbance and suicidal ideas. The patient is nervous/anxious.     Objective:  BP 128/86   Pulse (!) 45   Temp 98.3 F (36.8 C)    Ht 6' (1.829 m)   Wt 190 lb 3.2 oz (86.3 kg)   SpO2 98%   BMI 25.80 kg/m   BP Readings from Last 3 Encounters:  01/27/24 128/86  10/27/23 (!) 120/58  07/27/23 126/68    Wt Readings from Last 3 Encounters:  01/27/24 190 lb 3.2 oz (86.3 kg)  10/27/23 192 lb (87.1 kg)  09/24/23 183 lb (83 kg)    Physical Exam Constitutional:      General: He is not in acute distress.    Appearance: Normal appearance. He is well-developed.     Comments: NAD  Eyes:     Conjunctiva/sclera: Conjunctivae normal.     Pupils: Pupils are equal, round, and reactive to light.  Neck:     Thyroid : No thyromegaly.     Vascular: No JVD.  Cardiovascular:     Rate and Rhythm: Normal rate and regular rhythm.     Heart sounds: Normal heart sounds. No murmur heard.    No friction rub. No gallop.  Pulmonary:     Effort: Pulmonary effort is normal. No respiratory distress.     Breath sounds: Normal breath sounds. No wheezing or rales.  Chest:     Chest wall: No tenderness.  Abdominal:     General: Bowel sounds are normal. There is no distension.     Palpations: Abdomen is soft. There is no mass.     Tenderness: There is no abdominal tenderness. There is no guarding or rebound.  Musculoskeletal:        General: No tenderness. Normal range of motion.     Cervical back: Normal range of motion.  Lymphadenopathy:     Cervical: No cervical adenopathy.  Skin:    General: Skin is warm and dry.     Findings: No rash.  Neurological:     Mental Status: He is alert. Mental status is at baseline.     Cranial Nerves: No cranial nerve deficit.     Motor: No abnormal muscle tone.     Coordination: Coordination normal.     Gait: Gait normal.     Deep Tendon Reflexes: Reflexes are normal and symmetric.  Psychiatric:        Behavior: Behavior normal.        Thought Content: Thought content normal.        Judgment: Judgment normal.    LS w/pain  Using a walker Lab Results  Component Value Date   WBC 8.2  04/07/2023   HGB 13.2 04/07/2023   HCT 39.2 04/07/2023   PLT 225.0 04/07/2023   GLUCOSE 80 10/27/2023   CHOL 119 05/20/2022   TRIG 102 05/20/2022   HDL 35 (L) 05/20/2022   LDLCALC 65 05/20/2022   ALT 10 10/27/2023   AST 13 10/27/2023  NA 134 (L) 10/27/2023   K 4.3 10/27/2023   CL 100 10/27/2023   CREATININE 0.87 10/27/2023   BUN 16 10/27/2023   CO2 27 10/27/2023   TSH 3.48 07/27/2023   PSA 0.63 10/27/2023   INR 1.2 04/22/2022   HGBA1C 5.0 10/27/2023   MICROALBUR 6.4 (H) 10/27/2023    US  Abdomen Complete Result Date: 04/09/2023 CLINICAL DATA:  Nausea, elevated lipase, post cholecystectomy EXAM: ABDOMEN ULTRASOUND COMPLETE COMPARISON:  None Available. FINDINGS: Gallbladder: Surgically absent Common bile duct: Diameter: 8.6 mm. No intrahepatic biliary ductal dilatation. Liver: No focal lesion identified. Within normal limits in parenchymal echogenicity. Portal vein is patent on color Doppler imaging with normal direction of blood flow towards the liver. IVC: No abnormality visualized. Pancreas: Visualized segments unremarkable, portions obscured by overlying bowel gas. Spleen: Normal size.  Punctate  echogenic foci possibly granulomas. Right Kidney: Length: 12.1 cm. Echogenicity within normal limits. No mass or hydronephrosis visualized. Left Kidney: Length: 12.8 cm. Echogenicity within normal limits. No mass or hydronephrosis visualized. Abdominal aorta: No aneurysm visualized. Other findings: No ascites. IMPRESSION: 1. No acute findings. 2. Mildly dilated common bile duct, likely postcholecystectomy reservoir effect. Electronically Signed   By: JONETTA Faes M.D.   On: 04/09/2023 09:58    Assessment & Plan:   Problem List Items Addressed This Visit     Abnormal gait   Using a walker      Atrial fibrillation (HCC)   On Eliquis  No falls      Chronic back pain   S/p Foraminectomy/laminectomy by Dr Joshua on 09/11/22 (T11-12, T12-L1)  LBP is better On Percoset prn      Diabetes  mellitus type 2, diet-controlled (HCC)    Pt lost wt on diet  before, stable wt now Check A1c      Relevant Orders   Hemoglobin A1c   Comprehensive metabolic panel with GFR   History of cardioembolic cerebrovascular accident (CVA) - Primary   On Eliquis  MRI - Chronic pontine infarcts.       Relevant Orders   Comprehensive metabolic panel with GFR      Meds ordered this encounter  Medications   ketoconazole  (NIZORAL ) 2 % cream    Sig: Apply 1 Application topically daily.    Dispense:  45 g    Refill:  1      Follow-up: Return in about 3 months (around 04/28/2024) for a follow-up visit.  Marolyn Noel, MD

## 2024-01-27 NOTE — Assessment & Plan Note (Signed)
Using a walker 

## 2024-01-29 ENCOUNTER — Ambulatory Visit: Payer: Self-pay | Admitting: Internal Medicine

## 2024-01-31 DIAGNOSIS — M5416 Radiculopathy, lumbar region: Secondary | ICD-10-CM | POA: Diagnosis not present

## 2024-02-28 ENCOUNTER — Other Ambulatory Visit: Payer: Self-pay | Admitting: Internal Medicine

## 2024-02-28 ENCOUNTER — Ambulatory Visit: Payer: Self-pay

## 2024-02-28 MED ORDER — DOXYCYCLINE HYCLATE 100 MG PO TABS: 100.0000 mg | ORAL_TABLET | Freq: Two times a day (BID) | ORAL | 0 refills | Status: DC

## 2024-02-28 NOTE — Telephone Encounter (Signed)
 FYI Only or Action Required?: Action required by provider: update on patient condition and call to schedule I&D of cyst.  Patient was last seen in primary care on 01/27/2024 by [name], Karlynn GAILS, MD.  Called Nurse Triage reporting Cyst.  Symptoms began several weeks ago.  Interventions attempted: Rest, hydration, or home remedies.  Symptoms are: gradually worsening.  Triage Disposition: See Physician Within 24 Hours  Patient/caregiver understands and will follow disposition?: Yes  Copied from CRM [id number]. Topic: Clinical - Red Word Triage >> Feb 28, 2024  9:09 AM Gustabo D wrote: Pt has something on back maybe a cyst  or pocket of infection. Says it may need to be drained says it's gotten bigger and is starting to hurt. Reason for Disposition  [1] Swelling is painful to touch AND [2] no fever  Answer Assessment - Initial Assessment Questions Pt with cyst on his back that looks infected. Wife calling to get scheduled for I&D of cyst. Denies fever, cp, sob, or dizziness. States not an emergency but it is sore to touch and looking worse.  [facility] contacted to clarify on who is able to do the procedure. Advise HP message to clinic staff for scheduling. Advised UC if becomes feverish or havent heard from scheduling. Understood.   1. APPEARANCE of SWELLING: What does it look like?     Angry looking -red with pus inside 2. SIZE: How large is the swelling? (e.g., inches, cm; or compare to size of pinhead, tip of pen, eraser, coin, pea, grape, ping pong ball)      1.5x1.5 inches 3. LOCATION: Where is the swelling located?     On his back  4. ONSET: When did the swelling start?     2+ weeks- gets them chronically 5. COLOR: What color is it? Is there more than one color?     Red, pus inside 6. PAIN: Is there any pain? If Yes, ask: How bad is the pain? (Scale 1-10; or mild, moderate, severe)       Hurts when laying against it, sore 7. ITCH: Does it itch? If Yes, ask: How  bad is the itch?      denies 8. CAUSE: What do you think caused the swelling?     Infection, chronic cyst 9 OTHER SYMPTOMS: Do you have any other symptoms? (e.g., fever)     No fever, CP, Dizziness, SOB  Protocols used: Skin Lump or Localized Swelling-A-AH

## 2024-02-28 NOTE — Telephone Encounter (Signed)
 I will send a prescription for doxycycline.  Use Epsom salt compresses or paste. We can tentatively schedule the removal on Friday this week at 8:00, however, I would know for sure on Thursday if I can do it on Friday. Thank you

## 2024-02-29 NOTE — Telephone Encounter (Signed)
 Spoke with patient, scheduled for procedure. Patient gave verbal understanding

## 2024-02-29 NOTE — Telephone Encounter (Signed)
 LVM for patient

## 2024-03-01 DIAGNOSIS — M25561 Pain in right knee: Secondary | ICD-10-CM | POA: Diagnosis not present

## 2024-03-01 DIAGNOSIS — M5416 Radiculopathy, lumbar region: Secondary | ICD-10-CM | POA: Diagnosis not present

## 2024-03-03 ENCOUNTER — Ambulatory Visit: Admitting: Internal Medicine

## 2024-03-03 ENCOUNTER — Encounter: Payer: Self-pay | Admitting: Internal Medicine

## 2024-03-03 VITALS — BP 120/64 | HR 85 | Temp 98.0°F

## 2024-03-03 DIAGNOSIS — L720 Epidermal cyst: Secondary | ICD-10-CM

## 2024-03-03 MED ORDER — DOXYCYCLINE HYCLATE 100 MG PO TABS: 100.0000 mg | ORAL_TABLET | Freq: Two times a day (BID) | ORAL | 0 refills | Status: AC

## 2024-03-03 NOTE — Patient Instructions (Addendum)
      Medications changes include :   extend antibiotic      Return if symptoms worsen or fail to improve.

## 2024-03-03 NOTE — Progress Notes (Signed)
 Subjective:    Patient ID: Vernon Bishop, male    DOB: [birth year], 83 y.o.   MRN: [id number]      HPI Vernon Bishop is here for  Chief Complaint  Patient presents with   Cyst    Ruptured cyst on back Wednesday lots of pus and fluid came out    Cyst last week - popped tuesd ay night. Monday started doscy  Discussed the use of AI scribe software for clinical note transcription with the patient, who gave verbal consent to proceed.  History of Present Illness Vernon Bishop is an 83 year old male who presents with a ruptured cyst on his back.  He initially noticed an itching spot on his back last week, which developed into a sore cyst.  His PCP called in antibiotics for him which he started 4 days ago.  The cyst ruptured spontaneously on Tuesday night, releasing a significant amount of pink pus followed by white pieces.  Since the rupture the cyst has improved. No fevers or chills have been experienced. He reports normal spine pain, which is unrelated to the cyst. The cyst was still draining as of yesterday, but his wife has not checked it today.  He is currently on a blood thinner.       Medications and allergies reviewed with patient and updated if appropriate.  Current Outpatient Medications on File Prior to Visit  Medication Sig Dispense Refill   acetaminophen  (TYLENOL ) 325 MG tablet Take 325-650 mg by mouth every 6 (six) hours as needed for mild pain or headache.     amLODipine  (NORVASC ) 5 MG tablet TAKE 1 TABLET (5 MG TOTAL) BY MOUTH DAILY. 90 tablet 3   apixaban  (ELIQUIS ) 5 MG TABS tablet Take 1 tablet (5 mg total) by mouth 2 (two) times daily. 60 tablet 5   Ascorbic Acid (C-500/ROSE HIPS PO) Take by mouth.     chlorhexidine  (HIBICLENS ) 4 % external liquid Apply topically daily as needed. Use 1-2 times in the shower 500 mL 0   Cholecalciferol  (VITAMIN D3 PO) Take 1 tablet by mouth daily.     clotrimazole -betamethasone  (LOTRISONE ) cream APPLY TO AFFECTED AREA ON THE SKIN  2 TIMES A DAY 45 g 2   Cyanocobalamin  (VITAMIN B-12 CR) 1000 MCG TBCR Take 1,000 mcg by mouth daily.       doxycycline  (VIBRA -TABS) 100 MG tablet Take 1 tablet (100 mg total) by mouth 2 (two) times daily. 20 tablet 0   ezetimibe  (ZETIA ) 10 MG tablet Take 1 tablet (10 mg total) by mouth daily. 30 tablet 3   FLUoxetine  (PROZAC ) 40 MG capsule Take 1 capsule (40 mg total) by mouth daily. 90 capsule 2   furosemide  (LASIX ) 20 MG tablet TAKE 1 TABLET (20 MG TOTAL) BY MOUTH DAILY. 30 tablet 3   gabapentin  (NEURONTIN ) 300 MG capsule TAKE 1 CAPSULE (300 MG TOTAL) BY MOUTH 3 (THREE) TIMES DAILY. 270 capsule 3   ketoconazole  (NIZORAL ) 2 % cream Apply 1 Application topically daily. 45 g 1   Multiple Vitamin (MULTIVITAMIN WITH MINERALS) TABS tablet Take 1 tablet by mouth daily.     mupirocin  ointment (BACTROBAN ) 2 % On leg wound w/dressing change qd or bid 30 g 0   olmesartan -hydrochlorothiazide  (BENICAR  HCT) 20-12.5 MG tablet TAKE 1 TABLET BY MOUTH DAILY. 90 tablet 3   Oxycodone  HCl 10 MG TABS Take 1 tablet (10 mg total) by mouth every 6 (six) hours as needed. Per Dr Bonner     pantoprazole  (PROTONIX )  40 MG tablet TAKE 1 TABLET (40 MG TOTAL) BY MOUTH DAILY. 30 tablet 3   PRESCRIPTION MEDICATION CPAP- At bedtime (Patient not taking: Reported on 01/27/2024)     propranolol  ER (INDERAL  LA) 80 MG 24 hr capsule TAKE 1 CAPSULE (80 MG TOTAL) BY MOUTH DAILY. 90 capsule 4   Saw Palmetto, Serenoa repens, (SAW PALMETTO PO) Take 1 capsule by mouth daily.     sodium chloride  (OCEAN) 0.65 % SOLN nasal spray Place 1 spray into both nostrils as needed for congestion.     tetrahydrozoline 0.05 % ophthalmic solution Place 1 drop into both eyes 2 (two) times daily as needed (for redness).     traZODone  (DESYREL ) 50 MG tablet TAKE 1 TO 2 TABLETS BY MOUTH AT BEDTIME. 60 tablet 5   triamcinolone  ointment (KENALOG ) 0.1 % Apply 1 Application topically 4 (four) times daily. 160 g 2   zinc  gluconate 50 MG tablet Take 50 mg by mouth  daily.     No current facility-administered medications on file prior to visit.    Review of Systems     Objective:   Vitals:   03/03/24 0812  BP: 120/64  Pulse: 85  Temp: 98 F (36.7 C)  SpO2: 98%   BP Readings from Last 3 Encounters:  03/03/24 120/64  01/27/24 128/86  10/27/23 (!) 120/58   Wt Readings from Last 3 Encounters:  01/27/24 190 lb 3.2 oz (86.3 kg)  10/27/23 192 lb (87.1 kg)  09/24/23 183 lb (83 kg)   There is no height or weight on file to calculate BMI.    Physical Exam Constitutional:      General: He is not in acute distress.    Appearance: Normal appearance. He is not ill-appearing.  HENT:     Head: Normocephalic and atraumatic.  Skin:    General: Skin is warm and dry.     Comments: Open wound central mid back approximately the size of a pea that is actively draining bloody pus when bandage removed.  Slight erythema around open wound.  Area of induration on left side of wound.  After pressure I did get additional pus removed.  Wound continued to bleed, but this stopped with additional pressure.  Wound rebandaged  Neurological:     Mental Status: He is alert.            Assessment & Plan:    See Problem List for Assessment and Plan of chronic medical problems.   Assessment and Plan Assessment & Plan Infected sebaceous cyst of back Spontaneously ruptured, but with pressure I was able to extract more pus in addition to bleeding.  Still an area of induration. - Continue doxycycline , extend an additional 4 days for total of 14 days.  Prescription sent to pharmacy - Apply warm compresses to encourage drainage. - Cover with bandage to manage drainage and prevent infection. - Monitor for incomplete healing or recurrence. - Consider dermatology referral if recurrence occurs before infection.

## 2024-03-18 ENCOUNTER — Other Ambulatory Visit: Payer: Self-pay | Admitting: Internal Medicine

## 2024-04-05 ENCOUNTER — Other Ambulatory Visit: Payer: Self-pay

## 2024-04-05 ENCOUNTER — Other Ambulatory Visit: Payer: Self-pay | Admitting: Internal Medicine

## 2024-04-05 DIAGNOSIS — I480 Paroxysmal atrial fibrillation: Secondary | ICD-10-CM

## 2024-04-26 ENCOUNTER — Other Ambulatory Visit: Payer: Self-pay | Admitting: Internal Medicine

## 2024-05-02 ENCOUNTER — Other Ambulatory Visit: Payer: Self-pay | Admitting: Internal Medicine

## 2024-05-31 ENCOUNTER — Ambulatory Visit: Admitting: Internal Medicine

## 2024-09-25 ENCOUNTER — Ambulatory Visit
